# Patient Record
Sex: Male | Born: 1980 | Race: Black or African American | Hispanic: No | Marital: Married | Smoking: Current some day smoker
Health system: Southern US, Community
[De-identification: ages and names within clinical notes are randomized; demographics above are authoritative.]

## PROBLEM LIST (undated history)

## (undated) DIAGNOSIS — R7881 Bacteremia: Secondary | ICD-10-CM

## (undated) DIAGNOSIS — A4901 Methicillin susceptible Staphylococcus aureus infection, unspecified site: Secondary | ICD-10-CM

## (undated) DIAGNOSIS — I10 Essential (primary) hypertension: Secondary | ICD-10-CM

## (undated) DIAGNOSIS — B958 Unspecified staphylococcus as the cause of diseases classified elsewhere: Secondary | ICD-10-CM

## (undated) DIAGNOSIS — I330 Acute and subacute infective endocarditis: Secondary | ICD-10-CM

## (undated) DIAGNOSIS — I340 Nonrheumatic mitral (valve) insufficiency: Secondary | ICD-10-CM

## (undated) DIAGNOSIS — N179 Acute kidney failure, unspecified: Secondary | ICD-10-CM

## (undated) DIAGNOSIS — E11628 Type 2 diabetes mellitus with other skin complications: Secondary | ICD-10-CM

## (undated) DIAGNOSIS — A4101 Sepsis due to Methicillin susceptible Staphylococcus aureus: Secondary | ICD-10-CM

## (undated) DIAGNOSIS — E1165 Type 2 diabetes mellitus with hyperglycemia: Secondary | ICD-10-CM

## (undated) DIAGNOSIS — M60052 Infective myositis, left thigh: Secondary | ICD-10-CM

## (undated) DIAGNOSIS — A419 Sepsis, unspecified organism: Secondary | ICD-10-CM

## (undated) DIAGNOSIS — Z9119 Patient's noncompliance with other medical treatment and regimen: Secondary | ICD-10-CM

## (undated) DIAGNOSIS — R6521 Severe sepsis with septic shock: Secondary | ICD-10-CM

## (undated) DIAGNOSIS — Z720 Tobacco use: Secondary | ICD-10-CM

## (undated) DIAGNOSIS — L089 Local infection of the skin and subcutaneous tissue, unspecified: Secondary | ICD-10-CM

## (undated) DIAGNOSIS — Z9889 Other specified postprocedural states: Secondary | ICD-10-CM

## (undated) DIAGNOSIS — E119 Type 2 diabetes mellitus without complications: Secondary | ICD-10-CM

---
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
## 2002-02-08 ENCOUNTER — Emergency Department (HOSPITAL_COMMUNITY): Admission: EM | Admit: 2002-02-08 | Discharge: 2002-02-08 | Payer: Self-pay | Admitting: Emergency Medicine

## 2002-02-16 ENCOUNTER — Emergency Department (HOSPITAL_COMMUNITY): Admission: EM | Admit: 2002-02-16 | Discharge: 2002-02-16 | Payer: Self-pay | Admitting: Physical Therapy

## 2003-08-19 ENCOUNTER — Emergency Department (HOSPITAL_COMMUNITY): Admission: EM | Admit: 2003-08-19 | Discharge: 2003-08-20 | Payer: Self-pay | Admitting: Emergency Medicine

## 2003-08-28 ENCOUNTER — Emergency Department (HOSPITAL_COMMUNITY): Admission: EM | Admit: 2003-08-28 | Discharge: 2003-08-28 | Payer: Self-pay | Admitting: *Deleted

## 2003-08-29 ENCOUNTER — Emergency Department (HOSPITAL_COMMUNITY): Admission: EM | Admit: 2003-08-29 | Discharge: 2003-08-29 | Payer: Self-pay | Admitting: Emergency Medicine

## 2003-10-28 ENCOUNTER — Ambulatory Visit (HOSPITAL_BASED_OUTPATIENT_CLINIC_OR_DEPARTMENT_OTHER): Admission: RE | Admit: 2003-10-28 | Discharge: 2003-10-28 | Payer: Self-pay | Admitting: General Surgery

## 2003-10-28 ENCOUNTER — Ambulatory Visit (HOSPITAL_COMMUNITY): Admission: RE | Admit: 2003-10-28 | Discharge: 2003-10-28 | Payer: Self-pay | Admitting: General Surgery

## 2004-09-23 ENCOUNTER — Emergency Department (HOSPITAL_COMMUNITY): Admission: EM | Admit: 2004-09-23 | Discharge: 2004-09-23 | Payer: Self-pay

## 2004-09-29 ENCOUNTER — Emergency Department (HOSPITAL_COMMUNITY): Admission: EM | Admit: 2004-09-29 | Discharge: 2004-09-29 | Payer: Self-pay | Admitting: Emergency Medicine

## 2004-10-05 ENCOUNTER — Emergency Department (HOSPITAL_COMMUNITY): Admission: EM | Admit: 2004-10-05 | Discharge: 2004-10-05 | Payer: Self-pay | Admitting: Emergency Medicine

## 2007-03-26 ENCOUNTER — Emergency Department (HOSPITAL_COMMUNITY): Admission: EM | Admit: 2007-03-26 | Discharge: 2007-03-26 | Payer: Self-pay | Admitting: Emergency Medicine

## 2010-12-18 ENCOUNTER — Emergency Department (HOSPITAL_COMMUNITY)
Admission: EM | Admit: 2010-12-18 | Discharge: 2010-12-18 | Payer: Self-pay | Source: Home / Self Care | Admitting: Emergency Medicine

## 2012-02-22 ENCOUNTER — Encounter (HOSPITAL_COMMUNITY): Payer: Self-pay | Admitting: *Deleted

## 2012-02-22 ENCOUNTER — Emergency Department (HOSPITAL_COMMUNITY)
Admission: EM | Admit: 2012-02-22 | Discharge: 2012-02-22 | Disposition: A | Discharge status: Home / Self Care | Payer: Self-pay | Attending: Emergency Medicine | Admitting: Emergency Medicine

## 2012-02-22 DIAGNOSIS — L0211 Cutaneous abscess of neck: Secondary | ICD-10-CM | POA: Insufficient documentation

## 2012-02-22 DIAGNOSIS — L089 Local infection of the skin and subcutaneous tissue, unspecified: Secondary | ICD-10-CM | POA: Insufficient documentation

## 2012-02-22 DIAGNOSIS — L02811 Cutaneous abscess of head [any part, except face]: Secondary | ICD-10-CM

## 2012-02-22 DIAGNOSIS — F17200 Nicotine dependence, unspecified, uncomplicated: Secondary | ICD-10-CM | POA: Insufficient documentation

## 2012-02-22 DIAGNOSIS — E119 Type 2 diabetes mellitus without complications: Secondary | ICD-10-CM | POA: Insufficient documentation

## 2012-02-22 DIAGNOSIS — L02818 Cutaneous abscess of other sites: Secondary | ICD-10-CM | POA: Insufficient documentation

## 2012-02-22 MED ORDER — HYDROCODONE-ACETAMINOPHEN 5-325 MG PO TABS: 1.0000 | ORAL_TABLET | ORAL | Status: DC | PRN

## 2012-02-22 MED ORDER — SULFAMETHOXAZOLE-TRIMETHOPRIM 800-160 MG PO TABS: 1.0000 | ORAL_TABLET | Freq: Two times a day (BID) | ORAL | Status: AC

## 2012-02-22 MED ORDER — HYDROCODONE-ACETAMINOPHEN 5-325 MG PO TABS: 1.0000 | ORAL_TABLET | ORAL | Status: AC | PRN

## 2012-02-22 NOTE — Discharge Instructions (Signed)
Please have a recheck of your infection in the next 48 hours to be sure it is improving. You have been given prescriptions for antibiotics and pain medicine to use for your symptoms, please take these as instructed. He develop any worsening symptoms, increased pain, increased swelling, fever, chills please return to emergency room sooner.  Abscess An abscess (boil or furuncle) is an infected area that contains a collection of pus.  SYMPTOMS Signs and symptoms of an abscess include pain, tenderness, redness, or hardness. You may feel a moveable soft area under your skin. An abscess can occur anywhere in the body.  TREATMENT  A surgical cut (incision) may be made over your abscess to drain the pus. Gauze may be packed into the space or a drain may be looped through the abscess cavity (pocket). This provides a drain that will allow the cavity to heal from the inside outwards. The abscess may be painful for a few days, but should feel much better if it was drained.  Your abscess, if seen early, may not have localized and may not have been drained. If not, another appointment may be required if it does not get better on its own or with medications. HOME CARE INSTRUCTIONS   Only take over-the-counter or prescription medicines for pain, discomfort, or fever as directed by your caregiver.   Take your antibiotics as directed if they were prescribed. Finish them even if you start to feel better.   Keep the skin and clothes clean around your abscess.   If the abscess was drained, you will need to use gauze dressing to collect any draining pus. Dressings will typically need to be changed 3 or more times a day.   The infection may spread by skin contact with others. Avoid skin contact as much as possible.   Practice good hygiene. This includes regular hand washing, cover any draining skin lesions, and do not share personal care items.   If you participate in sports, do not share athletic equipment, towels,  whirlpools, or personal care items. Shower after every practice or tournament.   If a draining area cannot be adequately covered:   Do not participate in sports.   Children should not participate in day care until the wound has healed or drainage stops.   If your caregiver has given you a follow-up appointment, it is very important to keep that appointment. Not keeping the appointment could result in a much worse infection, chronic or permanent injury, pain, and disability. If there is any problem keeping the appointment, you must call back to this facility for assistance.  SEEK MEDICAL CARE IF:   You develop increased pain, swelling, redness, drainage, or bleeding in the wound site.   You develop signs of generalized infection including muscle aches, chills, fever, or a general ill feeling.   You have an oral temperature above 102 F (38.9 C).  MAKE SURE YOU:   Understand these instructions.   Will watch your condition.   Will get help right away if you are not doing well or get worse.  Document Released: 09/26/2005 Document Revised: 08/29/2011 Document Reviewed: 07/20/2008 [facility] Patient Information 2012 [place], [place].

## 2012-02-22 NOTE — ED Provider Notes (Signed)
History     CSN: [id number]  Arrival date & time 02/22/12  0029   First MD Initiated Contact with Patient 02/22/12 0047      Chief Complaint  Patient presents with  . Recurrent Skin Infections     HPI  History provided by the patient. Patient is a 32 year old African American male with history of diabetes who presents complaints of pain and swelling to the back of his neck and head. Patient reports having small little bumps around the hair on his head and neck, go the past week or more. Over the past 2 days he began having increasing swelling and soreness that area. Patient did try squeezing the area with small amounts of pus and blood. Patient denies any fever, chills, sweats. Patient has not done anything of her symptoms. He denies any other aggravating or alleviating factors. Symptoms are described as moderate to severe.   Past Medical History  Diagnosis Date  . Diabetes mellitus     History reviewed. No pertinent past surgical history.  No family history on file.  History  Substance Use Topics  . Smoking status: Current Everyday Smoker    Types: Cigars  . Smokeless tobacco: Not on file  . Alcohol Use: Yes      Review of Systems  All other systems reviewed and are negative.    Allergies  Review of patient's allergies indicates no known allergies.  Home Medications  No current outpatient prescriptions on file.  BP 163/88  Pulse 99  Temp(Src) 98.6 F (37 C) (Oral)  Resp 20  SpO2 98%  Physical Exam  Nursing note and vitals reviewed. Constitutional: He is oriented to person, place, and time. He appears well-developed and well-nourished. No distress.  HENT:  Head: Normocephalic.  Cardiovascular: Normal rate and regular rhythm.   Pulmonary/Chest: Effort normal and breath sounds normal.  Neurological: He is alert and oriented to person, place, and time.  Skin: Skin is warm.       4 cm area of erythema and induration the posterior right neck and scalp  within the hair with a central area of scabbing. No active bleeding or drainage. Area is tender to palpation.  Psychiatric: He has a normal mood and affect. His behavior is normal.    ED Course  Procedures    INCISION AND DRAINAGE Performed by: Angus Seller Consent: Verbal consent obtained. Risks and benefits: risks, benefits and alternatives were discussed Type: abscess  Body area: Right posterior scalp and neck  Anesthesia: local infiltration  Local anesthetic: lidocaine 2 % without epinephrine  Anesthetic total: 3 ml  Complexity: complex Blunt dissection to break up loculations  Drainage: purulent  Drainage amount: Small   Packing material: None   Patient tolerance: Patient tolerated the procedure well with no immediate complications.     1. Abscess of scalp       MDM  1:00 AM patient seen and evaluated. Patient in no acute distress.        Angus Seller, [place] 02/22/12 [phone]

## 2012-02-22 NOTE — ED Notes (Signed)
Pt has an boil on the back of his head.  Swelling to the surrounding area and appears to have been draining.  No fever or chills with this.

## 2012-02-22 NOTE — ED Notes (Signed)
Rx x 2, pt voiced understanding to f/u in 48 hours to check for sx improvement.

## 2012-02-26 NOTE — ED Provider Notes (Signed)
Evaluation and management procedures were performed by the PA/NP under my supervision/collaboration.   Felisa Bonier, MD 02/26/12 2024

## 2012-04-25 ENCOUNTER — Emergency Department (HOSPITAL_COMMUNITY): Payer: Managed Care, Other (non HMO)

## 2012-04-25 ENCOUNTER — Emergency Department (HOSPITAL_COMMUNITY)
Admission: EM | Admit: 2012-04-25 | Discharge: 2012-04-25 | Disposition: A | Discharge status: Home / Self Care | Payer: Managed Care, Other (non HMO) | Attending: Emergency Medicine | Admitting: Emergency Medicine

## 2012-04-25 ENCOUNTER — Encounter (HOSPITAL_COMMUNITY): Payer: Self-pay | Admitting: *Deleted

## 2012-04-25 DIAGNOSIS — H5710 Ocular pain, unspecified eye: Secondary | ICD-10-CM | POA: Insufficient documentation

## 2012-04-25 DIAGNOSIS — E119 Type 2 diabetes mellitus without complications: Secondary | ICD-10-CM | POA: Insufficient documentation

## 2012-04-25 DIAGNOSIS — H209 Unspecified iridocyclitis: Secondary | ICD-10-CM | POA: Insufficient documentation

## 2012-04-25 MED ORDER — CYCLOPENTOLATE HCL 1 % OP SOLN: 1.0000 [drp] | Freq: Three times a day (TID) | OPHTHALMIC | Status: DC

## 2012-04-25 MED ORDER — HYDROCODONE-ACETAMINOPHEN 5-325 MG PO TABS: 2.0000 | ORAL_TABLET | Freq: Four times a day (QID) | ORAL | Status: AC | PRN

## 2012-04-25 MED ORDER — IBUPROFEN 800 MG PO TABS: 800.0000 mg | ORAL_TABLET | Freq: Three times a day (TID) | ORAL | Status: AC | PRN

## 2012-04-25 MED ORDER — FLUORESCEIN SODIUM 1 MG OP STRP
ORAL_STRIP | OPHTHALMIC | Status: AC
  Administered 2012-04-25: 19:00:00
  Filled 2012-04-25: qty 1

## 2012-04-25 MED ORDER — TETRACAINE HCL 0.5 % OP SOLN
OPHTHALMIC | Status: AC
  Administered 2012-04-25: 2 [drp] via OPHTHALMIC
  Filled 2012-04-25: qty 2

## 2012-04-25 MED ORDER — TETRACAINE HCL 0.5 % OP SOLN
2.0000 [drp] | Freq: Once | OPHTHALMIC | Status: AC
  Administered 2012-04-25: 2 [drp] via OPHTHALMIC

## 2012-04-25 MED ORDER — HYDROCODONE-ACETAMINOPHEN 5-325 MG PO TABS
2.0000 | ORAL_TABLET | Freq: Once | ORAL | Status: AC
  Administered 2012-04-25: 2 via ORAL
  Filled 2012-04-25: qty 2

## 2012-04-25 MED ORDER — DOCUSATE SODIUM 100 MG PO CAPS: 100.0000 mg | ORAL_CAPSULE | Freq: Two times a day (BID) | ORAL | Status: AC
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
## 2012-04-25 NOTE — Discharge Instructions (Signed)
Iritis Iritis is when the colored part of the eye (iris) is red and painful (inflamed). Light might seem to hurt your eyes (light sensitivity). You may have blurred vision or start to tear up. It is important to treat this problem early.  HOME CARE  Use eyedrops or pills (corticosteroid medicine) as told by your doctor.   Only take medicine as told by your doctor.  GET HELP RIGHT AWAY IF:   You have redness in one or both eyes.   Light seems to hurt your eyes.   You have pain in one or both eyes.  MAKE SURE YOU:   Understand these instructions.   Will watch your condition.   Will get help right away if you are not doing well or get worse.  Document Released: 03/13/2010 Document Revised: 12/06/2011 Document Reviewed: 03/13/2010 [facility] Patient Information 2012 [place], [place].Eye Contusion A black eye can happen when the eye area is hit with force. Bleeding can happen under the skin of the eyelids. This causes the lids to puff up (swell) and look red and then black. This is often called a "black eye." If the injury involves only the lids and the surface of the eye, the black area will turn greenish and then yellowish in color before going away. The swelling may also go under the clear membrane that covers the white part of the eyeball making it very thick and red. If the black eye only involves the lids and tissues around the eye, the injured area will get better within a few days to weeks. However, black eyes can be serious and affect the eyeball and sight. CAUSES   Blunt injury or trauma to the face or eye area.   A forehead injury when blood under the skin works its way down to the eyelids.   Rubbing the eyes due to irritation (like allergies).   Blood clotting diseases.   Other serious eye or eye socket (orbit) diseases (such as a tumor, blood clot, or infection behind the eye).  SYMPTOMS   Swelling and redness of the eyelids.   Eyelid area turns to a black color.    Tenderness or soreness of the eyelids.  DIAGNOSIS  A diagnosis is usually based upon a thorough examination of the eye and surrounding area. The eye must be looked at carefully to be sure that it is not injured or that nothing else can threaten vision. An X-ray or CT scan may be needed to determine if there were any associated injuries, such as fractures. HOME CARE INSTRUCTIONS   If it is determined that there is no injury to the eye, you may continue normal activities.   Sunglasses may be worn to protect your eyes from bright light if this is uncomfortable.   Apply ice to the injury for 15 to 20 minutes, 3 to 4 times per day for swelling during the first 24 to 48 hours. To do this:   Put the ice in a plastic bag.   Place a towel between the bag of ice and your skin.   Sleep with your head high (elevated). You can put an extra pillow under your head. This may help with discomfort.   Only take over-the-counter or prescription medicines for pain, discomfort, or fever as directed by your caregiver.   Do not use aspirin as this can increase bleeding.  If there is an injury to the eye, treatment will be determined by the nature of the injury. SEEK IMMEDIATE MEDICAL CARE IF:  You have any form of visual loss in the eye on the side of the injury.   You have double vision. You see 2 of everything (diplopia).   You feel sick to your stomach, dizzy, sleepy, or faint.   You develop any discharge from the eye or your nose.   The swelling and discoloration does not begin to go away in a few days.  MAKE SURE YOU:   Understand these instructions.   Will watch your condition.   Will get help right away if you are not doing well or get worse.  Document Released: 12/14/2000 Document Revised: 12/06/2011 Document Reviewed: 11/02/2011 [facility] Patient Information 2012 [place], [place].
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
## 2012-04-26 NOTE — ED Provider Notes (Signed)
I saw and evaluated the patient, reviewed the resident's note and I agree with the findings and plan.  Pt with a left traumatic iritis.  DC home with op for followup.  The patient was given a cycloplegic for pain control.  Lyanne Co, MD 04/26/12 [phone]

## 2013-05-18 ENCOUNTER — Emergency Department (HOSPITAL_COMMUNITY): Payer: Self-pay

## 2013-05-18 ENCOUNTER — Encounter (HOSPITAL_COMMUNITY): Payer: Self-pay | Admitting: Emergency Medicine

## 2013-05-18 ENCOUNTER — Emergency Department (HOSPITAL_COMMUNITY)
Admission: EM | Admit: 2013-05-18 | Discharge: 2013-05-18 | Disposition: A | Discharge status: Home Health | Payer: Self-pay | Attending: Emergency Medicine | Admitting: Emergency Medicine

## 2013-05-18 DIAGNOSIS — R197 Diarrhea, unspecified: Secondary | ICD-10-CM | POA: Insufficient documentation

## 2013-05-18 DIAGNOSIS — R141 Gas pain: Secondary | ICD-10-CM | POA: Insufficient documentation

## 2013-05-18 DIAGNOSIS — R000 Tachycardia, unspecified: Secondary | ICD-10-CM | POA: Insufficient documentation

## 2013-05-18 DIAGNOSIS — R143 Flatulence: Secondary | ICD-10-CM | POA: Insufficient documentation

## 2013-05-18 DIAGNOSIS — E1169 Type 2 diabetes mellitus with other specified complication: Secondary | ICD-10-CM | POA: Insufficient documentation

## 2013-05-18 DIAGNOSIS — R739 Hyperglycemia, unspecified: Secondary | ICD-10-CM

## 2013-05-18 DIAGNOSIS — F17200 Nicotine dependence, unspecified, uncomplicated: Secondary | ICD-10-CM | POA: Insufficient documentation

## 2013-05-18 DIAGNOSIS — R142 Eructation: Secondary | ICD-10-CM | POA: Insufficient documentation

## 2013-05-18 LAB — URINALYSIS, ROUTINE W REFLEX MICROSCOPIC
Bilirubin Urine: NEGATIVE
Glucose, UA: >1000 mg/dL — AB
Ketones, ur: NEGATIVE mg/dL
Protein, ur: >300 mg/dL — AB
Specific Gravity, Urine: 1.042 — ABNORMAL HIGH (ref 1.005–1.030)
pH: 5 (ref 5.0–8.0)

## 2013-05-18 LAB — URINE MICROSCOPIC-ADD ON

## 2013-05-18 LAB — CBC WITH DIFFERENTIAL/PLATELET
Basophils Absolute: 0 10*3/uL (ref 0.0–0.1)
Basophils Relative: 0 % (ref 0–1)
Eosinophils Relative: 2 % (ref 0–5)
HCT: 46.8 % (ref 39.0–52.0)
Hemoglobin: 16.8 g/dL (ref 13.0–17.0)
Neutrophils Relative %: 73 % (ref 43–77)
WBC: 6.9 10*3/uL (ref 4.0–10.5)

## 2013-05-18 LAB — COMPREHENSIVE METABOLIC PANEL
AST: 14 U/L (ref 0–37)
Albumin: 3.9 g/dL (ref 3.5–5.2)
Creatinine, Ser: 1.22 mg/dL (ref 0.50–1.35)
GFR calc non Af Amer: 77 mL/min — ABNORMAL LOW (ref >90)
Glucose, Bld: 437 mg/dL — ABNORMAL HIGH (ref 70–99)
Sodium: 134 mEq/L — ABNORMAL LOW (ref 135–145)
Total Bilirubin: 0.2 mg/dL — ABNORMAL LOW (ref 0.3–1.2)

## 2013-05-18 LAB — GLUCOSE, CAPILLARY
Glucose-Capillary: 409 mg/dL — ABNORMAL HIGH (ref 70–99)
Glucose-Capillary: 368 mg/dL — ABNORMAL HIGH (ref 70–99)

## 2013-05-18 MED ORDER — IOHEXOL 300 MG/ML  SOLN
25.0000 mL | INTRAMUSCULAR | Status: AC
  Administered 2013-05-18 (×2): 25 mL via ORAL

## 2013-05-18 MED ORDER — SODIUM CHLORIDE 0.9 % IV SOLN
INTRAVENOUS | Status: DC
  Administered 2013-05-18: 19:00:00 via INTRAVENOUS

## 2013-05-18 MED ORDER — LORAZEPAM 2 MG/ML IJ SOLN
1.0000 mg | Freq: Once | INTRAMUSCULAR | Status: AC
  Administered 2013-05-18: 1 mg via INTRAVENOUS
  Filled 2013-05-18: qty 1

## 2013-05-18 MED ORDER — METFORMIN HCL 500 MG PO TABS: 500.0000 mg | ORAL_TABLET | Freq: Two times a day (BID) | ORAL | Status: DC

## 2013-05-18 MED ORDER — SODIUM CHLORIDE 0.9 % IV BOLUS (SEPSIS)
1000.0000 mL | Freq: Once | INTRAVENOUS | Status: DC
Start: 2013-05-18 — End: 2013-05-18

## 2013-05-18 MED ORDER — SODIUM CHLORIDE 0.9 % IV BOLUS (SEPSIS)
1000.0000 mL | Freq: Once | INTRAVENOUS | Status: AC
  Administered 2013-05-18: 1000 mL via INTRAVENOUS

## 2013-05-18 MED ORDER — SODIUM CHLORIDE 0.9 % IV SOLN: INTRAVENOUS | Status: DC

## 2013-05-18 MED ORDER — INSULIN ASPART 100 UNIT/ML ~~LOC~~ SOLN
8.0000 [IU] | Freq: Once | SUBCUTANEOUS | Status: AC
  Administered 2013-05-18: 8 [IU] via SUBCUTANEOUS
  Filled 2013-05-18: qty 1

## 2013-05-18 MED ORDER — IOHEXOL 300 MG/ML  SOLN
100.0000 mL | Freq: Once | INTRAMUSCULAR | Status: AC | PRN
  Administered 2013-05-18: 100 mL via INTRAVENOUS

## 2013-05-18 MED ORDER — ONDANSETRON HCL 4 MG/2ML IJ SOLN
4.0000 mg | Freq: Once | INTRAMUSCULAR | Status: AC
  Administered 2013-05-18: 4 mg via INTRAVENOUS
  Filled 2013-05-18: qty 2

## 2013-05-18 NOTE — ED Provider Notes (Signed)
History     CSN: [id number]  Arrival date & time 05/18/13  1322   First MD Initiated Contact with Patient 05/18/13 1538      Chief Complaint  Patient presents with  . Nausea  . Diarrhea    (Consider location/radiation/quality/duration/timing/severity/associated sxs/prior treatment) Patient is a 33 y.o. male presenting with diarrhea. The history is provided by the patient.  Diarrhea  patient here with watery diarrhea and vomiting that began yesterday after he has been getting. Denies any abdominal pain. No fever reported. His emesis has been nonbilious. No blood noted in stool. Symptoms have been persistent and nothing makes it better or worse. Denies any prior history of gallbladder disease. Has had similar symptoms x3 in the past without diagnosis. Denies any urinary symptoms. Does have a history of diabetes  Past Medical History  Diagnosis Date  . Diabetes mellitus     History reviewed. No pertinent past surgical history.  History reviewed. No pertinent family history.  History  Substance Use Topics  . Smoking status: Current Every Day Smoker    Types: Cigars  . Smokeless tobacco: Not on file  . Alcohol Use: Yes      Review of Systems  Gastrointestinal: Positive for diarrhea.  All other systems reviewed and are negative.    Allergies  Review of patient's allergies indicates no known allergies.  Home Medications  No current outpatient prescriptions on file.  BP 134/86  Pulse 106  Temp(Src) 98.1 F (36.7 C) (Oral)  Resp 16  SpO2 100%  Physical Exam  Nursing note and vitals reviewed. Constitutional: He is oriented to person, place, and time. He appears well-developed and well-nourished.  Non-toxic appearance. No distress.  HENT:  Head: Normocephalic and atraumatic.  Eyes: Conjunctivae, EOM and lids are normal. Pupils are equal, round, and reactive to light.  Neck: Normal range of motion. Neck supple. No tracheal deviation present. No mass present.   Cardiovascular: Regular rhythm and normal heart sounds.  Tachycardia present.  Exam reveals no gallop.   No murmur heard. Pulmonary/Chest: Effort normal and breath sounds normal. No stridor. No respiratory distress. He has no decreased breath sounds. He has no wheezes. He has no rhonchi. He has no rales.  Abdominal: Soft. Normal appearance and bowel sounds are normal. He exhibits no distension. There is no tenderness. There is no rebound and no CVA tenderness.  Musculoskeletal: Normal range of motion. He exhibits no edema and no tenderness.  Neurological: He is alert and oriented to person, place, and time. He has normal strength. No cranial nerve deficit or sensory deficit. GCS eye subscore is 4. GCS verbal subscore is 5. GCS motor subscore is 6.  Skin: Skin is warm and dry. No abrasion and no rash noted.  Psychiatric: He has a normal mood and affect. His speech is normal and behavior is normal.    ED Course  Procedures (including critical care time)  Labs Reviewed  COMPREHENSIVE METABOLIC PANEL - Abnormal; Notable for the following:    Sodium 134 (*)    Glucose, Bld 437 (*)    Total Bilirubin 0.2 (*)    GFR calc non Af Amer 77 (*)    GFR calc Af Amer 89 (*)    All other components within normal limits  GLUCOSE, CAPILLARY - Abnormal; Notable for the following:    Glucose-Capillary 409 (*)    All other components within normal limits  CBC WITH DIFFERENTIAL  LIPASE, BLOOD  URINALYSIS, ROUTINE W REFLEX MICROSCOPIC   No results found.  No diagnosis found.    MDM   Patient given IV fluids and insulin for his hyperglycemia. Also give anti-medics for his vomiting. Abdominal CT was performed and was negative for acute process. Patient be started on Glucophage for his hyperglycemia and was given referrals       Toy Baker, MD 05/18/13 2019

## 2013-05-18 NOTE — ED Notes (Signed)
CBG 184  

## 2013-05-18 NOTE — ED Notes (Signed)
Pt c/o diarrhea and nausea and bad taste in mouth after eating spaghetti at home. Pt states this is the 2nd time this year he has had these sx. Pt rates abd pain 7/10 and states he took nothing at home prior to coming to ED.

## 2013-05-18 NOTE — ED Notes (Signed)
CT notified that pt has completed contrast °

## 2013-05-18 NOTE — ED Notes (Signed)
Pt c/o diarrhea, nausea and foul odor with belching

## 2013-05-18 NOTE — ED Notes (Signed)
Dr Allen at bedside  

## 2013-10-26 ENCOUNTER — Emergency Department (HOSPITAL_COMMUNITY)
Admission: EM | Admit: 2013-10-26 | Discharge: 2013-10-26 | Disposition: A | Discharge status: Home / Self Care | Payer: Managed Care, Other (non HMO) | Attending: Emergency Medicine | Admitting: Emergency Medicine

## 2013-10-26 DIAGNOSIS — E11621 Type 2 diabetes mellitus with foot ulcer: Secondary | ICD-10-CM

## 2013-10-26 DIAGNOSIS — Z79899 Other long term (current) drug therapy: Secondary | ICD-10-CM | POA: Insufficient documentation

## 2013-10-26 DIAGNOSIS — L97509 Non-pressure chronic ulcer of other part of unspecified foot with unspecified severity: Secondary | ICD-10-CM | POA: Insufficient documentation

## 2013-10-26 DIAGNOSIS — I10 Essential (primary) hypertension: Secondary | ICD-10-CM | POA: Insufficient documentation

## 2013-10-26 DIAGNOSIS — F17200 Nicotine dependence, unspecified, uncomplicated: Secondary | ICD-10-CM | POA: Insufficient documentation

## 2013-10-26 DIAGNOSIS — E1169 Type 2 diabetes mellitus with other specified complication: Secondary | ICD-10-CM | POA: Insufficient documentation

## 2013-10-26 DIAGNOSIS — K029 Dental caries, unspecified: Secondary | ICD-10-CM

## 2013-10-26 DIAGNOSIS — R739 Hyperglycemia, unspecified: Secondary | ICD-10-CM

## 2013-10-26 LAB — BASIC METABOLIC PANEL
BUN: 13 mg/dL (ref 6–23)
CO2: 26 mEq/L (ref 19–32)
Calcium: 9.9 mg/dL (ref 8.4–10.5)
Chloride: 97 mEq/L (ref 96–112)
Creatinine, Ser: 1.37 mg/dL — ABNORMAL HIGH (ref 0.50–1.35)
GFR calc Af Amer: 77 mL/min — ABNORMAL LOW (ref >90)
GFR calc non Af Amer: 67 mL/min — ABNORMAL LOW (ref >90)
Glucose, Bld: 396 mg/dL — ABNORMAL HIGH (ref 70–99)
Potassium: 4.3 mEq/L (ref 3.5–5.1)
Sodium: 134 mEq/L — ABNORMAL LOW (ref 135–145)

## 2013-10-26 LAB — CBC WITH DIFFERENTIAL/PLATELET
Basophils Absolute: 0 10*3/uL (ref 0.0–0.1)
Basophils Relative: 0 % (ref 0–1)
Eosinophils Absolute: 0.1 10*3/uL (ref 0.0–0.7)
Eosinophils Relative: 1 % (ref 0–5)
HCT: 39.5 % (ref 39.0–52.0)
Hemoglobin: 14.3 g/dL (ref 13.0–17.0)
Lymphocytes Relative: 22 % (ref 12–46)
Lymphs Abs: 1.3 10*3/uL (ref 0.7–4.0)
MCH: 29.7 pg (ref 26.0–34.0)
MCHC: 36.2 g/dL — ABNORMAL HIGH (ref 30.0–36.0)
MCV: 82 fL (ref 78.0–100.0)
Monocytes Absolute: 0.3 10*3/uL (ref 0.1–1.0)
Monocytes Relative: 5 % (ref 3–12)
Neutro Abs: 4.3 10*3/uL (ref 1.7–7.7)
Neutrophils Relative %: 72 % (ref 43–77)
Platelets: 174 10*3/uL (ref 150–400)
RBC: 4.82 MIL/uL (ref 4.22–5.81)
RDW: 12 % (ref 11.5–15.5)
WBC: 6 10*3/uL (ref 4.0–10.5)

## 2013-10-26 LAB — URINALYSIS, ROUTINE W REFLEX MICROSCOPIC
Bilirubin Urine: NEGATIVE
Glucose, UA: >1000 mg/dL — AB
Ketones, ur: NEGATIVE mg/dL
Leukocytes, UA: NEGATIVE
Nitrite: NEGATIVE
Protein, ur: >300 mg/dL — AB
Specific Gravity, Urine: 1.044 — ABNORMAL HIGH (ref 1.005–1.030)
Urobilinogen, UA: 0.2 mg/dL (ref 0.0–1.0)
pH: 5 (ref 5.0–8.0)

## 2013-10-26 LAB — URINE MICROSCOPIC-ADD ON

## 2013-10-26 LAB — GLUCOSE, CAPILLARY: Glucose-Capillary: 360 mg/dL — ABNORMAL HIGH (ref 70–99)

## 2013-10-26 MED ORDER — PROPOFOL 10 MG/ML IV BOLUS: 0.5000 mg/kg | Freq: Once | INTRAVENOUS | Status: DC

## 2013-10-26 NOTE — ED Notes (Signed)
[place] [employer] [place]  Pt brought in to be checked out for diabetes ulcer to bilateral feet.  Pt reports n/v, chills and fever.  Pt reports hx diab and ?HTN.  Pt reports being given prescription for antibotics to treat infection to foot wound but was unable to get due to finances.

## 2013-11-01 NOTE — ED Provider Notes (Signed)
CSN: [id number]     Arrival date & time 10/26/13  1252 History   First MD Initiated Contact with Patient 10/26/13 1318     Chief Complaint  Patient presents with  . Blood Sugar Problem   (Consider location/radiation/quality/duration/timing/severity/associated sxs/prior Treatment) HPI  33 year old male brought in by police for evaluation of wounds prior to being incarcerated. Patient reports his wounds are chronic and has had both a home for at least several months. History diabetes and hypertension. He was prescribed medications but did not fill them. Does not have routine medical care. Previous evaluation of these wounds to prescribe antibiotics, but he did not fill this prescription either. Denies any significant pain in his feet. No drainage. No fevers or chills. Some mild nausea, but no vomiting.  Past Medical History  Diagnosis Date  . Diabetes mellitus    No past surgical history on file. No family history on file. History  Substance Use Topics  . Smoking status: Current Every Day Smoker    Types: Cigars  . Smokeless tobacco: Not on file  . Alcohol Use: Yes    Review of Systems  All systems reviewed and negative, other than as noted in HPI.   Allergies  Review of patient's allergies indicates no known allergies.  Home Medications   Current Outpatient Rx  Name  Route  Sig  Dispense  Refill  . metFORMIN (GLUCOPHAGE) 500 MG tablet   Oral   Take 1 tablet (500 mg total) by mouth 2 (two) times daily with a meal.   60 tablet   0    BP 162/97  Pulse 93  Temp(Src) 98.4 F (36.9 C) (Oral)  Resp 16  SpO2 100% Physical Exam  Nursing note and vitals reviewed. Constitutional: He appears well-developed and well-nourished. No distress.  HENT:  Head: Normocephalic and atraumatic.  Very poor dentition and diffuse dental decay. No evidence of drainable collection. Posterior pharynx is clear. Uvula is midline. No submental or induration. No tongue elevation. Handling  secretions. Normal sounding phonation.  Eyes: Conjunctivae are normal. Right eye exhibits no discharge. Left eye exhibits no discharge.  Neck: Neck supple.  Cardiovascular: Normal rate, regular rhythm and normal heart sounds.  Exam reveals no gallop and no friction rub.   No murmur heard. Pulmonary/Chest: Effort normal and breath sounds normal. No respiratory distress.  Abdominal: Soft. He exhibits no distension. There is no tenderness.  Musculoskeletal: He exhibits no edema and no tenderness.  Neurological: He is alert.  Skin: Skin is warm and dry.  Roughly quarter-sized wounds in the same area to bilateral feet. Wounds are to the plantar aspect over the area of the heads of the second and third metatarsals. Wound on the left foot appears to be healing pretty well with thick callus formation. The wound on the right appears to be healing without complication. The periphery of this wound this will callus, but there is a small area of friable tissue at the center. It is healthy-appearing. No drainage. Wound does not probe deeply. No foul smell.  Psychiatric: He has a normal mood and affect. His behavior is normal. Thought content normal.    ED Course  Procedures (including critical care time) Labs Review Labs Reviewed  GLUCOSE, CAPILLARY - Abnormal; Notable for the following:    Glucose-Capillary 360 (*)    All other components within normal limits  BASIC METABOLIC PANEL - Abnormal; Notable for the following:    Sodium 134 (*)    Glucose, Bld 396 (*)  Creatinine, Ser 1.37 (*)    GFR calc non Af Amer 67 (*)    GFR calc Af Amer 77 (*)    All other components within normal limits  CBC WITH DIFFERENTIAL - Abnormal; Notable for the following:    MCHC 36.2 (*)    All other components within normal limits  URINALYSIS, ROUTINE W REFLEX MICROSCOPIC - Abnormal; Notable for the following:    Specific Gravity, Urine 1.044 (*)    Glucose, UA >1000 (*)    Hgb urine dipstick MODERATE (*)     Protein, ur >300 (*)    All other components within normal limits  URINE MICROSCOPIC-ADD ON - Abnormal; Notable for the following:    Casts HYALINE CASTS (*)    All other components within normal limits   Imaging Review No results found.  EKG Interpretation   None       MDM   1. 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 Hyperglycemia   3. Dental decay    33 year old male with poorly controlled diabetes. No ketonuria. No metabolic acidosis. Wounds to bilateral feet likely related to his history poorly controlled diabetes. Chronic appearing, but do not appear to be infected. Discuss with patient the need for diligent wound care. Discussed the need to be compliant with his medications. Discussed the need to establish primary care. Resource list was provided.   Raeford Razor, MD 11/01/13 [phone]

## 2013-12-01 ENCOUNTER — Emergency Department (HOSPITAL_COMMUNITY): Payer: Self-pay

## 2013-12-01 ENCOUNTER — Emergency Department (HOSPITAL_COMMUNITY)
Admission: EM | Admit: 2013-12-01 | Discharge: 2013-12-01 | Disposition: A | Discharge status: Home / Self Care | Payer: Self-pay | Attending: Emergency Medicine | Admitting: Emergency Medicine

## 2013-12-01 ENCOUNTER — Encounter (HOSPITAL_COMMUNITY): Payer: Self-pay | Admitting: Emergency Medicine

## 2013-12-01 DIAGNOSIS — E1169 Type 2 diabetes mellitus with other specified complication: Secondary | ICD-10-CM | POA: Insufficient documentation

## 2013-12-01 DIAGNOSIS — L97509 Non-pressure chronic ulcer of other part of unspecified foot with unspecified severity: Secondary | ICD-10-CM | POA: Insufficient documentation

## 2013-12-01 DIAGNOSIS — Z79899 Other long term (current) drug therapy: Secondary | ICD-10-CM | POA: Insufficient documentation

## 2013-12-01 DIAGNOSIS — E11621 Type 2 diabetes mellitus with foot ulcer: Secondary | ICD-10-CM

## 2013-12-01 DIAGNOSIS — E119 Type 2 diabetes mellitus without complications: Secondary | ICD-10-CM

## 2013-12-01 DIAGNOSIS — I10 Essential (primary) hypertension: Secondary | ICD-10-CM | POA: Insufficient documentation

## 2013-12-01 HISTORY — DX: Essential (primary) hypertension: I10

## 2013-12-01 LAB — CBC WITH DIFFERENTIAL/PLATELET
Basophils Absolute: 0 10*3/uL (ref 0.0–0.1)
Basophils Relative: 0 % (ref 0–1)
Hemoglobin: 14.1 g/dL (ref 13.0–17.0)
MCHC: 36.1 g/dL — ABNORMAL HIGH (ref 30.0–36.0)
Neutro Abs: 4.2 10*3/uL (ref 1.7–7.7)
Neutrophils Relative %: 66 % (ref 43–77)
RDW: 12.3 % (ref 11.5–15.5)
WBC: 6.3 10*3/uL (ref 4.0–10.5)

## 2013-12-01 LAB — COMPREHENSIVE METABOLIC PANEL
ALT: 13 U/L (ref 0–53)
AST: 9 U/L (ref 0–37)
Albumin: 3.3 g/dL — ABNORMAL LOW (ref 3.5–5.2)
Alkaline Phosphatase: 86 U/L (ref 39–117)
Chloride: 98 mEq/L (ref 96–112)
Potassium: 3.6 mEq/L (ref 3.5–5.1)
Total Bilirubin: 0.2 mg/dL — ABNORMAL LOW (ref 0.3–1.2)

## 2013-12-01 LAB — GLUCOSE, CAPILLARY: Glucose-Capillary: 333 mg/dL — ABNORMAL HIGH (ref 70–99)

## 2013-12-01 MED ORDER — SODIUM CHLORIDE 0.9 % IV SOLN
Freq: Once | INTRAVENOUS | Status: AC
  Administered 2013-12-01: 09:00:00 via INTRAVENOUS

## 2013-12-01 MED ORDER — INSULIN ASPART 100 UNIT/ML ~~LOC~~ SOLN
10.0000 [IU] | Freq: Once | SUBCUTANEOUS | Status: AC
  Administered 2013-12-01: 10 [IU] via SUBCUTANEOUS
  Filled 2013-12-01: qty 1

## 2013-12-01 MED ORDER — CIPROFLOXACIN HCL 500 MG PO TABS: 500.0000 mg | ORAL_TABLET | Freq: Two times a day (BID) | ORAL | Status: DC

## 2013-12-01 MED ORDER — SODIUM CHLORIDE 0.9 % IV BOLUS (SEPSIS)
1000.0000 mL | Freq: Once | INTRAVENOUS | Status: AC
  Administered 2013-12-01: 1000 mL via INTRAVENOUS

## 2013-12-01 MED ORDER — METFORMIN HCL 500 MG PO TABS: 500.0000 mg | ORAL_TABLET | Freq: Two times a day (BID) | ORAL | Status: DC

## 2013-12-01 MED ORDER — HYDROCODONE-ACETAMINOPHEN 5-325 MG PO TABS: 1.0000 | ORAL_TABLET | ORAL | Status: DC | PRN

## 2013-12-01 NOTE — Progress Notes (Signed)
Case Manager Consult for Medication / PCP assistance.ED Case Manager met patient and wife at bedside.Role of ED Case Manager explained.Patient reports foot pain  As reason for today's ED visit.Patient reports he has no Programmer, applications or a PCP.Education provided on the Importance of establishing a PCP.Resource sheet provided  For the [facility].This Clinical research associate relayed to patient with his consent she can e-mail the [facility] and assist  with PCP appointment set up. Patient  Was   Receptive to this.

## 2013-12-01 NOTE — Progress Notes (Signed)
Case Manager liaised with Dr Tiburcio Pea re patients plan of care. Patient will be discharged with prescriptions for Metformin for Diabetes Mellitus and Ciprofloxacin anti-biotics. Patient / Wife educated by this Case Manager  Regarding the Generic prescription savings club at [employer] that offers free generic diabetic medications  up to a 30   Day supply.Patient provided with a resource sheet on the savings club.Patient educated his antibiotics will be on the [employer].Patient aware certain antibiotics are  $3.99 or free.Patient provided with resource sheet for the [place] Diabetic uninsured program which offers a free blood glucose meter and education resources. Education  Hotel manager provided for Managing Diabetes-[employer] and the [employer].Teach back method used to ensure patient understanding. Patient  Confirms his understanding of verbal education / written resources and states he will call and make his PCP follow up appointment for the [facility].

## 2013-12-01 NOTE — ED Provider Notes (Signed)
CSN: [id number]     Arrival date & time 12/01/13  0815 History   First MD Initiated Contact with Patient 12/01/13 [phone]     Chief Complaint  Patient presents with  . Wound Infection    Feet   (Consider location/radiation/quality/duration/timing/severity/associated sxs/prior Treatment) HPI  [name]. presents emergency Department with chief complaint of bilateral foot pain and ulceration.  This is a gentleman with a history of poorly controlled diabetes.  He has been unable to obtain medication for the past 6 months.  Patient was recently incarcerated states he was on insulin at that time.  He has been unable to obtain medication since then.  She complains of bilateral foot pain.  He has ulcerations.  Placing covers on the window and feels there may be purulent discharge.  Patient denies any fevers, chills, nausea, vomiting , chest pain, shortness of breath, diarrhea.  He states that his ulcers are painful.  Past Medical History  Diagnosis Date  . Diabetes mellitus   . Hypertension    History reviewed. No pertinent past surgical history. History reviewed. No pertinent family history. History  Substance Use Topics  . Smoking status: Current Every Day Smoker    Types: Cigars  . Smokeless tobacco: Not on file  . Alcohol Use: Yes    Review of Systems  Constitutional: Negative for fever and chills.  Respiratory: Negative for cough and shortness of breath.   Cardiovascular: Negative for chest pain and palpitations.  Gastrointestinal: Negative for vomiting, abdominal pain, diarrhea and constipation.  Endocrine: Positive for polydipsia and polyuria.  Genitourinary: Negative for dysuria, urgency and frequency.  Musculoskeletal: Negative for arthralgias and myalgias.  Skin: Positive for wound. Negative for rash.  Neurological: Negative for headaches.    Allergies  Review of patient's allergies indicates no known allergies.  Home Medications   Current Outpatient Rx   Name  Route  Sig  Dispense  Refill  . metFORMIN (GLUCOPHAGE) 500 MG tablet   Oral   Take 1 tablet (500 mg total) by mouth 2 (two) times daily with a meal.   60 tablet   0    BP 137/90  Pulse 74  Temp(Src) 97.9 F (36.6 C) (Oral)  Resp 12  SpO2 100% Physical Exam Physical Exam  Nursing note and vitals reviewed. Constitutional: He appears well-developed and well-nourished. No distress.  HENT:  Head: Normocephalic and atraumatic.  Eyes: Conjunctivae normal are normal. No scleral icterus.  Neck: Normal range of motion. Neck supple.  Cardiovascular: Normal rate, regular rhythm and normal heart sounds.   Pulmonary/Chest: Effort normal and breath sounds normal. No respiratory distress.  Abdominal: Soft. There is no tenderness.  Musculoskeletal: He exhibits no edema.  Neurological: He is alert.  Skin: Skin is warm and dry. He is not diaphoretic.  BL Ulcerations of the plantar surface. Appears consistent with ulcerated corns. Dermal tissue visible with undermining. Thick callous present.  Tender to palpation.  No purulent or serous discharge noted.  He has bilateral tinea unguium and tinea pedis.  Dorsal pulses intact.   Psychiatric: His behavior is normal.  ; ED Course  Procedures (including critical care time) Labs Review Labs Reviewed  GLUCOSE, CAPILLARY - Abnormal; Notable for the following:    Glucose-Capillary 333 (*)    All other components within normal limits  CBC WITH DIFFERENTIAL - Abnormal; Notable for the following:    MCHC 36.1 (*)    All other components within normal limits  COMPREHENSIVE METABOLIC PANEL - Abnormal; Notable  for the following:    Sodium 134 (*)    Glucose, Bld 365 (*)    Albumin 3.3 (*)    Total Bilirubin 0.2 (*)    GFR calc non Af Amer 81 (*)    All other components within normal limits   Imaging Review No results found.  EKG Interpretation   None       MDM   1. Diabetic foot ulcers   2. Diabetes    Patient seen in shared visit  with Dr. Jodi Mourning. I have placed consult with care management. Labs show no anion gap,  Elevated glucose.  12:30 PM Patient given fluids and insulin with little movement in his blood sugar.  He was given resources for medications and will [facility]. Metformin and cipro at discharge.     Arthor Captain, PA-C 12/01/13 1611

## 2013-12-01 NOTE — ED Notes (Signed)
Pt states he has what he thinks are diabetic ulcers on the bottom of his feet. He states they are developing pus and hurting.

## 2013-12-01 NOTE — ED Notes (Signed)
Patient transported to X-ray 

## 2013-12-02 NOTE — ED Provider Notes (Signed)
Medical screening examination/treatment/procedure(s) were conducted as a shared visit with non-physician practitioner(s) or resident  and myself.  I personally evaluated the patient during the encounter and agree with the findings and plan unless otherwise indicated.    I have personally reviewed any xrays and/ or EKG's with the provider and I agree with interpretation.   Uncontrolled DM, foot ulcers.  Discussed importance of improved DM mgmt and risks long term, pt understands.  Financial issues.  Exam ulceration to bilateral plantar surfaces - no bone palpated, no drainage or warmth, abd soft/ NT, mild dry mm.  Hyperglycemia, fluids given.  Close outpt fup discussed/ arranged with care mgmt.  Pt felt improved in ED.   Enid Skeens, MD 12/02/13 [phone]

## 2013-12-15 ENCOUNTER — Encounter (HOSPITAL_COMMUNITY): Payer: Self-pay | Admitting: Emergency Medicine

## 2013-12-15 ENCOUNTER — Emergency Department (HOSPITAL_COMMUNITY)
Admission: EM | Admit: 2013-12-15 | Discharge: 2013-12-15 | Disposition: A | Discharge status: Home / Self Care | Payer: Managed Care, Other (non HMO) | Attending: Emergency Medicine | Admitting: Emergency Medicine

## 2013-12-15 DIAGNOSIS — L02219 Cutaneous abscess of trunk, unspecified: Secondary | ICD-10-CM | POA: Insufficient documentation

## 2013-12-15 DIAGNOSIS — L0291 Cutaneous abscess, unspecified: Secondary | ICD-10-CM

## 2013-12-15 DIAGNOSIS — E119 Type 2 diabetes mellitus without complications: Secondary | ICD-10-CM | POA: Insufficient documentation

## 2013-12-15 DIAGNOSIS — F17200 Nicotine dependence, unspecified, uncomplicated: Secondary | ICD-10-CM | POA: Insufficient documentation

## 2013-12-15 DIAGNOSIS — Z79899 Other long term (current) drug therapy: Secondary | ICD-10-CM | POA: Insufficient documentation

## 2013-12-15 DIAGNOSIS — I10 Essential (primary) hypertension: Secondary | ICD-10-CM | POA: Insufficient documentation

## 2013-12-15 MED ORDER — OXYCODONE-ACETAMINOPHEN 5-325 MG PO TABS: 2.0000 | ORAL_TABLET | ORAL | Status: DC | PRN

## 2013-12-15 MED ORDER — ONDANSETRON 4 MG PO TBDP
4.0000 mg | ORAL_TABLET | Freq: Once | ORAL | Status: AC
  Administered 2013-12-15: 4 mg via ORAL
  Filled 2013-12-15: qty 1

## 2013-12-15 MED ORDER — MORPHINE SULFATE 4 MG/ML IJ SOLN
4.0000 mg | Freq: Once | INTRAMUSCULAR | Status: AC
  Administered 2013-12-15: 4 mg via INTRAVENOUS
  Filled 2013-12-15: qty 1

## 2013-12-15 MED ORDER — CLINDAMYCIN HCL 150 MG PO CAPS: 300.0000 mg | ORAL_CAPSULE | Freq: Three times a day (TID) | ORAL | Status: DC

## 2013-12-15 NOTE — ED Provider Notes (Signed)
CSN: [id number]     Arrival date & time 12/15/13  1191 History   First MD Initiated Contact with Patient 12/15/13 [phone]     Chief Complaint  Patient presents with  . Abscess    between folds of buttocks    (Consider location/radiation/quality/duration/timing/severity/associated sxs/prior Treatment) HPI Comments: Patient is a 33 year old male with a past medical history of hypertension and diabetes who presents with a 1 week history of a "boil" near his rectum. Symptoms started gradually and progressively worsened since the onset. The area is painful. The pain is throbbing and severe without radiation. Patient reports a history of boils. Patient did not try anything for symptoms. Patient reports sitting makes the pain worse. No alleviating factors. No other associated symptoms.    Past Medical History  Diagnosis Date  . Diabetes mellitus   . Hypertension    History reviewed. No pertinent past surgical history. History reviewed. No pertinent family history. History  Substance Use Topics  . Smoking status: Current Every Day Smoker    Types: Cigars  . Smokeless tobacco: Never Used  . Alcohol Use: Yes    Review of Systems  Constitutional: Negative for fever, chills and fatigue.  HENT: Negative for trouble swallowing.   Eyes: Negative for visual disturbance.  Respiratory: Negative for shortness of breath.   Cardiovascular: Negative for chest pain and palpitations.  Gastrointestinal: Negative for nausea, vomiting, abdominal pain and diarrhea.  Genitourinary: Negative for dysuria and difficulty urinating.  Musculoskeletal: Negative for arthralgias and neck pain.  Skin: Positive for wound. Negative for color change.  Neurological: Negative for dizziness and weakness.  Psychiatric/Behavioral: Negative for dysphoric mood.    Allergies  Review of patient's allergies indicates no known allergies.  Home Medications   Current Outpatient Rx  Name  Route  Sig  Dispense  Refill  .  metFORMIN (GLUCOPHAGE) 500 MG tablet   Oral   Take 1 tablet (500 mg total) by mouth 2 (two) times daily with a meal.   60 tablet   1    BP 153/85  Pulse 78  Temp(Src) 97.9 F (36.6 C) (Oral)  Resp 20  Ht 6\' 2"  (1.88 m)  Wt 220 lb (99.791 kg)  BMI 28.23 kg/m2  SpO2 100% Physical Exam  Nursing note and vitals reviewed. Constitutional: He is oriented to person, place, and time. He appears well-developed and well-nourished. No distress.  HENT:  Head: Normocephalic and atraumatic.  Eyes: Conjunctivae and EOM are normal.  Neck: Normal range of motion.  Cardiovascular: Normal rate and regular rhythm.  Exam reveals no gallop and no friction rub.   No murmur heard. Pulmonary/Chest: Effort normal and breath sounds normal. He has no wheezes. He has no rales. He exhibits no tenderness.  Abdominal: Soft. He exhibits no distension. There is no tenderness. There is no rebound and no guarding.  Genitourinary:  4x3cm area of fluctuance with surrounding induration of left perineal area without extension into the scrotum or rectum. No open wound.   Musculoskeletal: Normal range of motion.  Neurological: He is alert and oriented to person, place, and time. Coordination normal.  Speech is goal-oriented. Moves limbs without ataxia.   Skin: Skin is warm and dry.  Psychiatric: He has a normal mood and affect. His behavior is normal.    ED Course  Procedures (including critical care time)  INCISION AND DRAINAGE Performed by: Emilia Beck Consent: Verbal consent obtained. Risks and benefits: risks, benefits and alternatives were discussed Type: abscess  Body area: perineal  area  Anesthesia: local infiltration  Incision was made with a scalpel.  Local anesthetic: lidocaine 2% with epinephrine  Anesthetic total: 3 ml  Complexity: complex Blunt dissection to break up loculations  Drainage: purulent  Drainage amount: 10 mL  Packing material: none Patient tolerance: Patient  tolerated the procedure well with no immediate complications.     Labs Review Labs Reviewed - No data to display Imaging Review No results found.  EKG Interpretation   None       MDM   1. Abscess and cellulitis     6:55 AM Patient will have perineal abscess drained. Vitals stable and patient afebrile. The abscess does not appear to involve the rectum or scrotum.   7:38 AM Abscess drained without complication. Patient will be discharged with antibiotics and pain medication. Vitals stable and patient afebrile. Patient instructed to return with worsening or concerning symptoms.    Emilia Beck, [place] 12/15/13 [phone]

## 2013-12-15 NOTE — ED Notes (Signed)
Pt has possible abscess below rectum inbetween folds of buttocks

## 2013-12-16 NOTE — ED Provider Notes (Signed)
Medical screening examination/treatment/procedure(s) were performed by non-physician practitioner and as supervising physician I was immediately available for consultation/collaboration.   Loren Racer, MD 12/16/13 [phone]

## 2014-01-14 ENCOUNTER — Encounter (HOSPITAL_COMMUNITY): Payer: Self-pay | Admitting: Emergency Medicine

## 2014-01-14 DIAGNOSIS — Z794 Long term (current) use of insulin: Secondary | ICD-10-CM

## 2014-01-14 DIAGNOSIS — L03119 Cellulitis of unspecified part of limb: Secondary | ICD-10-CM

## 2014-01-14 DIAGNOSIS — IMO0002 Reserved for concepts with insufficient information to code with codable children: Principal | ICD-10-CM | POA: Diagnosis present

## 2014-01-14 DIAGNOSIS — E1165 Type 2 diabetes mellitus with hyperglycemia: Principal | ICD-10-CM | POA: Diagnosis present

## 2014-01-14 DIAGNOSIS — I10 Essential (primary) hypertension: Secondary | ICD-10-CM | POA: Diagnosis present

## 2014-01-14 DIAGNOSIS — E1169 Type 2 diabetes mellitus with other specified complication: Principal | ICD-10-CM

## 2014-01-14 DIAGNOSIS — E876 Hypokalemia: Secondary | ICD-10-CM | POA: Diagnosis present

## 2014-01-14 DIAGNOSIS — Z91199 Patient's noncompliance with other medical treatment and regimen due to unspecified reason: Secondary | ICD-10-CM

## 2014-01-14 DIAGNOSIS — L97509 Non-pressure chronic ulcer of other part of unspecified foot with unspecified severity: Secondary | ICD-10-CM | POA: Diagnosis present

## 2014-01-14 DIAGNOSIS — F17200 Nicotine dependence, unspecified, uncomplicated: Secondary | ICD-10-CM | POA: Diagnosis present

## 2014-01-14 DIAGNOSIS — L02619 Cutaneous abscess of unspecified foot: Secondary | ICD-10-CM | POA: Diagnosis present

## 2014-01-14 DIAGNOSIS — Z9119 Patient's noncompliance with other medical treatment and regimen: Secondary | ICD-10-CM

## 2014-01-14 NOTE — ED Notes (Signed)
Bilateral feet pain  - L > R.  pcp told pt. To come here for xray's of feet - infection to the bone.

## 2014-01-15 ENCOUNTER — Inpatient Hospital Stay (HOSPITAL_COMMUNITY)
Admission: EM | Admit: 2014-01-15 | Discharge: 2014-01-19 | DRG: 638 | Disposition: A | Discharge status: Home / Self Care | Payer: Self-pay | Attending: Internal Medicine | Admitting: Internal Medicine

## 2014-01-15 ENCOUNTER — Emergency Department (HOSPITAL_COMMUNITY): Payer: Self-pay

## 2014-01-15 ENCOUNTER — Encounter (HOSPITAL_COMMUNITY): Payer: Self-pay

## 2014-01-15 DIAGNOSIS — L97509 Non-pressure chronic ulcer of other part of unspecified foot with unspecified severity: Secondary | ICD-10-CM | POA: Diagnosis present

## 2014-01-15 DIAGNOSIS — E11621 Type 2 diabetes mellitus with foot ulcer: Secondary | ICD-10-CM

## 2014-01-15 DIAGNOSIS — L97519 Non-pressure chronic ulcer of other part of right foot with unspecified severity: Secondary | ICD-10-CM

## 2014-01-15 DIAGNOSIS — L8990 Pressure ulcer of unspecified site, unspecified stage: Secondary | ICD-10-CM

## 2014-01-15 DIAGNOSIS — L089 Local infection of the skin and subcutaneous tissue, unspecified: Secondary | ICD-10-CM

## 2014-01-15 DIAGNOSIS — E08621 Diabetes mellitus due to underlying condition with foot ulcer: Secondary | ICD-10-CM

## 2014-01-15 DIAGNOSIS — E1369 Other specified diabetes mellitus with other specified complication: Secondary | ICD-10-CM

## 2014-01-15 DIAGNOSIS — E1169 Type 2 diabetes mellitus with other specified complication: Secondary | ICD-10-CM

## 2014-01-15 DIAGNOSIS — Z720 Tobacco use: Secondary | ICD-10-CM

## 2014-01-15 DIAGNOSIS — E11628 Type 2 diabetes mellitus with other skin complications: Secondary | ICD-10-CM

## 2014-01-15 DIAGNOSIS — I10 Essential (primary) hypertension: Secondary | ICD-10-CM | POA: Diagnosis present

## 2014-01-15 DIAGNOSIS — F17200 Nicotine dependence, unspecified, uncomplicated: Secondary | ICD-10-CM

## 2014-01-15 DIAGNOSIS — Z91199 Patient's noncompliance with other medical treatment and regimen due to unspecified reason: Secondary | ICD-10-CM

## 2014-01-15 DIAGNOSIS — Z9119 Patient's noncompliance with other medical treatment and regimen: Secondary | ICD-10-CM

## 2014-01-15 DIAGNOSIS — L97529 Non-pressure chronic ulcer of other part of left foot with unspecified severity: Secondary | ICD-10-CM

## 2014-01-15 HISTORY — DX: Tobacco use: Z72.0

## 2014-01-15 HISTORY — DX: Local infection of the skin and subcutaneous tissue, unspecified: L08.9

## 2014-01-15 HISTORY — DX: Local infection of the skin and subcutaneous tissue, unspecified: E11.628

## 2014-01-15 LAB — URINALYSIS W MICROSCOPIC + REFLEX CULTURE
BILIRUBIN URINE: NEGATIVE
Glucose, UA: 250 mg/dL — AB
KETONES UR: NEGATIVE mg/dL
Leukocytes, UA: NEGATIVE
Nitrite: NEGATIVE
PH: 5 (ref 5.0–8.0)
Protein, ur: 100 mg/dL — AB
Specific Gravity, Urine: 1.024 (ref 1.005–1.030)
Urobilinogen, UA: 0.2 mg/dL (ref 0.0–1.0)

## 2014-01-15 LAB — CBC WITH DIFFERENTIAL/PLATELET
BASOS ABS: 0 10*3/uL (ref 0.0–0.1)
Basophils Relative: 0 % (ref 0–1)
EOS PCT: 2 % (ref 0–5)
Eosinophils Absolute: 0.2 10*3/uL (ref 0.0–0.7)
HCT: 37.8 % — ABNORMAL LOW (ref 39.0–52.0)
Hemoglobin: 13.4 g/dL (ref 13.0–17.0)
LYMPHS ABS: 2.3 10*3/uL (ref 0.7–4.0)
LYMPHS PCT: 30 % (ref 12–46)
MCH: 29.3 pg (ref 26.0–34.0)
MCHC: 35.4 g/dL (ref 30.0–36.0)
MCV: 82.7 fL (ref 78.0–100.0)
Monocytes Absolute: 0.6 10*3/uL (ref 0.1–1.0)
Monocytes Relative: 8 % (ref 3–12)
NEUTROS PCT: 60 % (ref 43–77)
Neutro Abs: 4.7 10*3/uL (ref 1.7–7.7)
PLATELETS: 204 10*3/uL (ref 150–400)
RBC: 4.57 MIL/uL (ref 4.22–5.81)
RDW: 12.2 % (ref 11.5–15.5)
WBC: 7.8 10*3/uL (ref 4.0–10.5)

## 2014-01-15 LAB — BASIC METABOLIC PANEL
BUN: 14 mg/dL (ref 6–23)
CALCIUM: 9.3 mg/dL (ref 8.4–10.5)
CHLORIDE: 100 meq/L (ref 96–112)
CO2: 24 meq/L (ref 19–32)
CREATININE: 1.13 mg/dL (ref 0.50–1.35)
GFR calc non Af Amer: 84 mL/min — ABNORMAL LOW (ref >90)
Glucose, Bld: 267 mg/dL — ABNORMAL HIGH (ref 70–99)
Potassium: 3.7 mEq/L (ref 3.7–5.3)
SODIUM: 138 meq/L (ref 137–147)

## 2014-01-15 LAB — GLUCOSE, CAPILLARY
GLUCOSE-CAPILLARY: 217 mg/dL — AB (ref 70–99)
Glucose-Capillary: 268 mg/dL — ABNORMAL HIGH (ref 70–99)
Glucose-Capillary: 399 mg/dL — ABNORMAL HIGH (ref 70–99)
Glucose-Capillary: 261 mg/dL — ABNORMAL HIGH (ref 70–99)
Glucose-Capillary: 209 mg/dL — ABNORMAL HIGH (ref 70–99)

## 2014-01-15 LAB — C-REACTIVE PROTEIN

## 2014-01-15 LAB — SEDIMENTATION RATE: SED RATE: 40 mm/h — AB (ref 0–16)

## 2014-01-15 LAB — HEMOGLOBIN A1C
Hgb A1c MFr Bld: 14.1 % — ABNORMAL HIGH (ref <5.7)
Mean Plasma Glucose: 358 mg/dL — ABNORMAL HIGH (ref <117)

## 2014-01-15 LAB — C-PEPTIDE: C PEPTIDE: 1.46 ng/mL (ref 0.80–3.90)

## 2014-01-15 MED ORDER — ACETAMINOPHEN 325 MG PO TABS
650.0000 mg | ORAL_TABLET | Freq: Four times a day (QID) | ORAL | Status: DC | PRN
  Administered 2014-01-17 – 2014-01-19 (×6): 650 mg via ORAL
  Filled 2014-01-15 (×6): qty 2

## 2014-01-15 MED ORDER — VANCOMYCIN HCL IN DEXTROSE 1-5 GM/200ML-% IV SOLN
1000.0000 mg | Freq: Three times a day (TID) | INTRAVENOUS | Status: DC
  Administered 2014-01-15 – 2014-01-19 (×12): 1000 mg via INTRAVENOUS
  Filled 2014-01-15 (×14): qty 200

## 2014-01-15 MED ORDER — SODIUM CHLORIDE 0.9 % IV SOLN: 1000.0000 mL | INTRAVENOUS | Status: DC

## 2014-01-15 MED ORDER — PNEUMOCOCCAL VAC POLYVALENT 25 MCG/0.5ML IJ INJ
0.5000 mL | INJECTION | INTRAMUSCULAR | Status: AC
  Administered 2014-01-16: 0.5 mL via INTRAMUSCULAR
  Filled 2014-01-15: qty 0.5

## 2014-01-15 MED ORDER — SODIUM CHLORIDE 0.9 % IV SOLN: 1000.0000 mL | Freq: Once | INTRAVENOUS | Status: DC

## 2014-01-15 MED ORDER — INSULIN GLARGINE 100 UNIT/ML ~~LOC~~ SOLN
15.0000 [IU] | Freq: Every day | SUBCUTANEOUS | Status: DC
  Administered 2014-01-15: 15 [IU] via SUBCUTANEOUS
  Filled 2014-01-15 (×2): qty 0.15

## 2014-01-15 MED ORDER — SODIUM CHLORIDE 0.9 % IV SOLN
INTRAVENOUS | Status: DC
  Administered 2014-01-15 – 2014-01-19 (×4): via INTRAVENOUS
  Filled 2014-01-15 (×13): qty 1000

## 2014-01-15 MED ORDER — PIPERACILLIN-TAZOBACTAM 3.375 G IVPB 30 MIN
3.3750 g | Freq: Once | INTRAVENOUS | Status: AC
  Administered 2014-01-15: 3.375 g via INTRAVENOUS
  Filled 2014-01-15: qty 50

## 2014-01-15 MED ORDER — ACETAMINOPHEN 650 MG RE SUPP: 650.0000 mg | Freq: Four times a day (QID) | RECTAL | Status: DC | PRN

## 2014-01-15 MED ORDER — INFLUENZA VAC SPLIT QUAD 0.5 ML IM SUSP
0.5000 mL | INTRAMUSCULAR | Status: AC
  Administered 2014-01-16: 0.5 mL via INTRAMUSCULAR
  Filled 2014-01-15: qty 0.5

## 2014-01-15 MED ORDER — ENOXAPARIN SODIUM 40 MG/0.4ML ~~LOC~~ SOLN
40.0000 mg | SUBCUTANEOUS | Status: DC
  Administered 2014-01-15: 40 mg via SUBCUTANEOUS
  Filled 2014-01-15: qty 0.4

## 2014-01-15 MED ORDER — INSULIN ASPART 100 UNIT/ML ~~LOC~~ SOLN
0.0000 [IU] | Freq: Three times a day (TID) | SUBCUTANEOUS | Status: DC
  Administered 2014-01-15: 8 [IU] via SUBCUTANEOUS
  Administered 2014-01-15: 5 [IU] via SUBCUTANEOUS
  Administered 2014-01-15: 15 [IU] via SUBCUTANEOUS
  Administered 2014-01-16: 11 [IU] via SUBCUTANEOUS
  Administered 2014-01-16: 8 [IU] via SUBCUTANEOUS
  Administered 2014-01-17: 5 [IU] via SUBCUTANEOUS
  Administered 2014-01-17: 3 [IU] via SUBCUTANEOUS
  Administered 2014-01-17: 5 [IU] via SUBCUTANEOUS
  Administered 2014-01-18 (×2): 3 [IU] via SUBCUTANEOUS
  Administered 2014-01-19: 5 [IU] via SUBCUTANEOUS

## 2014-01-15 MED ORDER — PIPERACILLIN-TAZOBACTAM 3.375 G IVPB 30 MIN: 3.3750 g | Freq: Three times a day (TID) | INTRAVENOUS | Status: DC

## 2014-01-15 MED ORDER — SODIUM CHLORIDE 0.9 % IV SOLN
1000.0000 mL | Freq: Once | INTRAVENOUS | Status: AC
  Administered 2014-01-15: 1000 mL via INTRAVENOUS

## 2014-01-15 MED ORDER — INSULIN ASPART 100 UNIT/ML ~~LOC~~ SOLN
4.0000 [IU] | Freq: Three times a day (TID) | SUBCUTANEOUS | Status: DC
  Administered 2014-01-15: 4 [IU] via SUBCUTANEOUS

## 2014-01-15 MED ORDER — PIPERACILLIN-TAZOBACTAM 3.375 G IVPB
3.3750 g | Freq: Three times a day (TID) | INTRAVENOUS | Status: DC
  Administered 2014-01-15 – 2014-01-19 (×12): 3.375 g via INTRAVENOUS
  Filled 2014-01-15 (×13): qty 50

## 2014-01-15 MED ORDER — OXYCODONE HCL 5 MG PO TABS
5.0000 mg | ORAL_TABLET | ORAL | Status: DC | PRN
  Administered 2014-01-16 – 2014-01-19 (×5): 5 mg via ORAL
  Filled 2014-01-15 (×5): qty 1

## 2014-01-15 MED ORDER — INSULIN GLARGINE 100 UNIT/ML ~~LOC~~ SOLN: 20.0000 [IU] | Freq: Every day | SUBCUTANEOUS | Status: DC

## 2014-01-15 MED ORDER — VANCOMYCIN HCL IN DEXTROSE 1-5 GM/200ML-% IV SOLN
1000.0000 mg | Freq: Once | INTRAVENOUS | Status: AC
  Administered 2014-01-15: 1000 mg via INTRAVENOUS
  Filled 2014-01-15 (×2): qty 200

## 2014-01-15 MED ORDER — INSULIN ASPART PROT & ASPART (70-30 MIX) 100 UNIT/ML ~~LOC~~ SUSP
15.0000 [IU] | Freq: Two times a day (BID) | SUBCUTANEOUS | Status: DC
  Administered 2014-01-15: 15 [IU] via SUBCUTANEOUS
  Filled 2014-01-15: qty 10

## 2014-01-15 NOTE — H&P (Addendum)
[facility] History and Physical  Dwayne [name] ZOX:[id number] DOB: 24-Jan-1980 DOA: 01/15/2014   PCP: No PCP Per Patient   Chief Complaint: Foot ulcers  HPI:  34 year old male with a history of diabetes mellitus presents with bilateral diabetic foot ulcers, left greater than right. The patient states that the ulcer on his right foot began around June 2014 and ulcer on his left foot began around October 2014. He states that the left foot ulcer has gotten deeper and has been draining malodorous material for the past month. Most recently, the patient was treated with Cipro when he was discharged from the ED on 12/01/2013. Because of financial constraints, the patient has been in and out of multiple EDs for his diabetic foot ulcers. The patient states that he has been on metformin for the better part of a year. Again, most of his prescriptions have been through emergency departments. Today, the patient went to [facility]. He was noted to have a blood sugar of >500. The patient was given 15 units subcutaneous insulin and sent to the emergency department. He states that he was diagnosed as a diabetic at age 63 at which time he was placed on insulin. The patient denies fevers, chills, chest discomfort, shortness of breath, nausea, vomiting, diarrhea, abdominal pain, dysuria, hematuria.  In ED, the patient was started on vancomycin and Zosyn. Serum creatinine was 1.13. BMP otherwise was unremarkable. CBC was unremarkable. Plain films of his left foot were negative for subcutaneous emphysema or foreign body.  Serum glucose was 267 with anion gap 14. Assessment/Plan:  Diabetic foot infection -Particularly concerning in the patient's left foot -positive probe to bone -Concerned about underlying osteomyelitis -MRI bilateral feet -will need ortho consult for I&D -Continue vancomycin and Zosyn -Please obtain bone biopsy if possible during debridement -Blood cultures have been obtained in the  ED -ESR--40, await CRP Diabetes mellitus -Unclear if the patient is a type I or type II, but given the patient's history suspect that he may have been undiagnosed type I or MODY -Check C-peptide -Hemoglobin A1c -NovoLog sliding scale -due to patient's financial situation, start patient on 70/30 insulin--15units bit although he will likely need more -Check UA  -continue IVF-- pt with anion gap 14 Tobacco abuse -Tobacco cessation discussed Active Problems:   Diabetic foot infection       Past Medical History  Diagnosis Date  . Diabetes mellitus   . Hypertension    History reviewed. No pertinent past surgical history. Social History:  reports that he has been smoking Cigars.  He has never used smokeless tobacco. He reports that he drinks alcohol. He reports that he does not use illicit drugs.   History reviewed. No pertinent family history.   No Known Allergies    Prior to Admission medications   Medication Sig Start Date End Date Taking? Authorizing Provider  metFORMIN (GLUCOPHAGE) 500 MG tablet Take 1 tablet (500 mg total) by mouth 2 (two) times daily with a meal. 12/01/13  Yes Dwayne Mail, PA-C    Review of Systems:  Constitutional:  No weight loss, night sweats, Fevers, chills, fatigue.  Head&Eyes: No headache.  No vision loss.  No eye pain or scotoma ENT:  No Difficulty swallowing,Tooth/dental problems,Sore throat,   Cardio-vascular:  No chest pain, Orthopnea, PND, swelling in lower extremities,  dizziness, palpitations  GI:  No  abdominal pain, nausea, vomiting, diarrhea, loss of appetite, hematochezia, melena, heartburn, indigestion, Resp:  No shortness of breath with exertion or at rest. No cough. No coughing  up of blood .No wheezing.No chest wall deformity  Skin:  no rash or lesions.  GU:  no dysuria, change in color of urine, no urgency or frequency. No flank pain.  Musculoskeletal:  No joint pain or swelling. No decreased range of motion. No back  pain.  Psych:  No change in mood or affect. No depression or anxiety. Neurologic: No headache, no dysesthesia, no focal weakness, no vision loss. No syncope  Physical Exam: Filed Vitals:   01/14/14 2025  BP: 135/84  Pulse: 88  Temp: 98.2 F (36.8 C)  TempSrc: Oral  Resp: 14  Height: 6' 2"  (1.88 m)  Weight: 98.431 kg (217 lb)  SpO2: 100%   General:  A&O x 3, NAD, nontoxic, pleasant/cooperative Head/Eye: No conjunctival hemorrhage, no icterus, [place]/AT, No nystagmus ENT:  No icterus,  No thrush,  no pharyngeal exudate Neck:  No masses, no lymphadenpathy, no bruits CV:  RRR, no rub, no gallop, no S3 Lung:  CTAB, good air movement, no wheeze, no rhonchi Abdomen: soft/NT, +BS, nondistended, no peritoneal signs Ext: No cyanosis, No rashes, No petechiae, No lymphangitis; approximate 2 cm ulceration on the left plantar surface head of the third metatarsal area with purulent drainage and tenderness to palpation--no crepitance; right foot plantar ulceration 2 cm without any drainage   Labs on Admission:  Basic Metabolic Panel:  Recent Labs Lab 01/15/14 0205  NA 138  K 3.7  CL 100  CO2 24  GLUCOSE 267*  BUN 14  CREATININE 1.13  CALCIUM 9.3   Liver Function Tests: No results found for this basename: AST, ALT, ALKPHOS, BILITOT, PROT, ALBUMIN,  in the last 168 hours No results found for this basename: LIPASE, AMYLASE,  in the last 168 hours No results found for this basename: AMMONIA,  in the last 168 hours CBC:  Recent Labs Lab 01/15/14 0205  WBC 7.8  NEUTROABS 4.7  HGB 13.4  HCT 37.8*  MCV 82.7  PLT 204   Cardiac Enzymes: No results found for this basename: CKTOTAL, CKMB, CKMBINDEX, TROPONINI,  in the last 168 hours BNP: No components found with this basename: POCBNP,  CBG:  Recent Labs Lab 01/15/14 0207  GLUCAP 268*    Radiological Exams on Admission: Dg Foot Complete Left  01/15/2014   CLINICAL DATA:  Foot ulcer in history of diabetes  EXAM: LEFT FOOT -  COMPLETE 3+ VIEW  COMPARISON:  12/01/2013  FINDINGS: There is an ulcer at the level of the third metatarsal head. No evidence of subcutaneous emphysema. Radiodense foreign body or osseous infection. No acute fracture. Chronic marginal erosion at the first interphalangeal joint.  IMPRESSION: Ulcer of the plantar forefoot. No evidence of foreign body or osseous infection.   Electronically Signed   By: Jorje Guild M.D.   On: 01/15/2014 02:55   Dg Foot Complete Right  01/15/2014   CLINICAL DATA:  Diabetes.  Foot ulcer.  EXAM: RIGHT FOOT COMPLETE - 3+ VIEW  COMPARISON:  12/01/2013  FINDINGS: There is an enlarging ulcer at the level of the plantar third metatarsal head. There is no evidence of radiodense foreign body or spreading subcutaneous emphysema. No joint narrowing or osseous erosion. No acute fracture. Chronic marginal erosion at the great toe interphalangeal joint.  IMPRESSION: Plantar forefoot ulcer without evidence of foreign body or osseous infection   Electronically Signed   By: Jorje Guild M.D.   On: 01/15/2014 02:59        Time spent:60 minutes Code Status:   FULL Family Communication:  spouse at bedside   Dwayne Minasyan, DO  [facility] Pager [phone]  If 7PM-7AM, please contact night-coverage [email or web address] Password [email or web address] 01/15/2014, 3:57 AM

## 2014-01-15 NOTE — Progress Notes (Signed)
Inpatient Diabetes Program Recommendations  AACE/ADA: New Consensus Statement on Inpatient Glycemic Control (2013)  Target Ranges:  Prepandial:   less than 140 mg/dL      Peak postprandial:   less than 180 mg/dL (1-2 hours)      Critically ill patients:  140 - 180 mg/dL   Reason for Visit: Results for Dwayne [name], Dwayne [name] (MRN [id number]) as of 01/15/2014 16:04  Ref. Range 01/15/2014 02:07 01/15/2014 07:49 01/15/2014 12:18  Glucose-Capillary Latest Range: 70-99 mg/dL 409268 (H) 811399 (H) 914217 (H)   Results for Dwayne [name], Dwayne [name] (MRN [id number]) as of 01/15/2014 16:04  Ref. Range 01/15/2014 05:30  Hemoglobin A1C Latest Range: <5.7 % 14.1 (H)   Note elevated A1C.  Discussed Diabetes management with patient .  He states that he was diagnosed with diabetes at age 34.  He was on insulin at initial diagnosis however states he has not been taking insulin for the past 5 years.  He has spot checked his CBG and it has been in the 200-300's.  A1C indicates average CBG=303 mg/dL.     Informed him that he would likely need insulin at discharge and he states that he knows how to administer.  Agree with start of Lantus/Novolog.  May need Match program for medications.  [facility]  Also needs meter for checking CBG's.  Explained importance of CBG control for healing and to prevent long term complications.  He verbalized understanding however states "my blood sugars are okay when I work out".  Hopeful that by having access [facility] will help.  Beryl MeagerJenny Chiquita Heckert, RN, BC-ADM Inpatient Diabetes Coordinator Pager [phone]

## 2014-01-15 NOTE — Progress Notes (Signed)
Patient ID: Dwayne [name]  male  ZOX:[id number]    DOB: 1980/03/04    DOA: 01/15/2014  PCP: No PCP Per Patient  Assessment/Plan:    Diabetic foot infection bilateral - Foot x-rays reviewed, both feet has ulcers on the plantar forefoot with foul-smelling discharge - MRI of the right and left foot ordered - Continue vancomycin and Zosyn - Discussed with Dr. Magnus Ivan, orthopedics, may need a debridement, possibly bone biopsy     Tobacco abuse - Patient counseled against smoking    Diabetes mellitus: Uncontrolled - Placed on Lantus, sliding scale insulin, meal coverage  Medical noncompliance - Per patient and his wife, he has been following with the ER for his regular diabetic care, does not have any PCP, wound care. Patient reports that he does not take any medication except metformin prescription was given to him by the ER.  DVT Prophylaxis:  Code Status: Full code  Family Communication: Discussed with patient and his wife at the bedside  Disposition: Discussed with case manager to make appointment with community wellness center.    Subjective: Patient seen and examined, ulcers on the plantar forefoot bilaterally, foul-smelling discharge.  Objective: Weight change:  No intake or output data in the 24 hours ending 01/15/14 1257 Blood pressure 142/82, pulse 81, temperature 97.8 F (36.6 C), temperature source Oral, resp. rate 16, height 6\' 2"  (1.88 m), weight 98.2 kg (216 lb 7.9 oz), SpO2 100.00%.  Physical Exam: General: Alert and awake, oriented x3, not in any acute distress. CVS: S1-S2 clear, no murmur rubs or gallops Chest: clear to auscultation bilaterally, no wheezing, rales or rhonchi Abdomen: soft nontender, nondistended, normal bowel sounds  Extremities: no cyanosis, clubbing or edema noted bilaterally, about 2 cm ulceration on the plantar surface of both feet with drainage, tenderness to palpation Neuro: Cranial nerves II-XII intact, no focal neurological  deficits  Lab Results: Basic Metabolic Panel:  Recent Labs Lab 01/15/14 0205  NA 138  K 3.7  CL 100  CO2 24  GLUCOSE 267*  BUN 14  CREATININE 1.13  CALCIUM 9.3   Liver Function Tests: No results found for this basename: AST, ALT, ALKPHOS, BILITOT, PROT, ALBUMIN,  in the last 168 hours No results found for this basename: LIPASE, AMYLASE,  in the last 168 hours No results found for this basename: AMMONIA,  in the last 168 hours CBC:  Recent Labs Lab 01/15/14 0205  WBC 7.8  NEUTROABS 4.7  HGB 13.4  HCT 37.8*  MCV 82.7  PLT 204   Cardiac Enzymes: No results found for this basename: CKTOTAL, CKMB, CKMBINDEX, TROPONINI,  in the last 168 hours BNP: No components found with this basename: POCBNP,  CBG:  Recent Labs Lab 01/15/14 0207 01/15/14 0749 01/15/14 1218  GLUCAP 268* 399* 217*     Micro Results: No results found for this or any previous visit (from the past 240 hour(s)).  Studies/Results: Dg Foot Complete Left  01/15/2014   CLINICAL DATA:  Foot ulcer in history of diabetes  EXAM: LEFT FOOT - COMPLETE 3+ VIEW  COMPARISON:  12/01/2013  FINDINGS: There is an ulcer at the level of the third metatarsal head. No evidence of subcutaneous emphysema. Radiodense foreign body or osseous infection. No acute fracture. Chronic marginal erosion at the first interphalangeal joint.  IMPRESSION: Ulcer of the plantar forefoot. No evidence of foreign body or osseous infection.   Electronically Signed   By: Tiburcio Pea M.D.   On: 01/15/2014 02:55   Dg Foot Complete Right  01/15/2014  CLINICAL DATA:  Diabetes.  Foot ulcer.  EXAM: RIGHT FOOT COMPLETE - 3+ VIEW  COMPARISON:  12/01/2013  FINDINGS: There is an enlarging ulcer at the level of the plantar third metatarsal head. There is no evidence of radiodense foreign body or spreading subcutaneous emphysema. No joint narrowing or osseous erosion. No acute fracture. Chronic marginal erosion at the great toe interphalangeal joint.   IMPRESSION: Plantar forefoot ulcer without evidence of foreign body or osseous infection   Electronically Signed   By: Tiburcio PeaJonathan  Watts M.D.   On: 01/15/2014 02:59    Medications: Scheduled Meds: . enoxaparin (LOVENOX) injection  40 mg Subcutaneous Q24H  . insulin aspart  0-15 Units Subcutaneous TID WC  . insulin aspart protamine- aspart  15 Units Subcutaneous BID WC  . piperacillin-tazobactam (ZOSYN)  IV  3.375 g Intravenous Q8H  . vancomycin  1,000 mg Intravenous Q8H      LOS: 0 days   Merced Hanners M.D. [facility] 01/15/2014, 12:57 PM Pager: [phone]  If 7PM-7AM, please contact night-coverage [email or web address] Password [email or web address]

## 2014-01-15 NOTE — ED Provider Notes (Signed)
CSN: [id number]     Arrival date & time 01/14/14  2016 History   First MD Initiated Contact with Patient 01/15/14 0143     Chief Complaint  Patient presents with  . Foot Pain  . Foot Ulcer   (Consider location/radiation/quality/duration/timing/severity/associated sxs/prior Treatment) HPI 34 year old male presents to emergency department with complaint of worsening diabetic ulcers.  He, reports he's had ulcers for many months to years.  Over the last several weeks, however, he has had increasing size to the ulcers, along with drainage, left greater than right.  He denies a loss of sensation to his feet, and reports the ulcers are painful at times.  He denies any fever or chills.  He was seen at his new primary care doctor's office today, and noted to have a blood sugar over 500.  He was instructed to go to the emergency department for further evaluation of possible osteomyelitis.  Patient reports he is currently taking metformin 500 mg twice a day.  He reports in the past.  He had been on insulin.  He's had no significant medical care the last several years due to incarceration and lack of insurance Past Medical History  Diagnosis Date  . Diabetes mellitus   . Hypertension    History reviewed. No pertinent past surgical history. History reviewed. No pertinent family history. History  Substance Use Topics  . Smoking status: Current Every Day Smoker    Types: Cigars  . Smokeless tobacco: Never Used  . Alcohol Use: Yes    Review of Systems  See History of Present Illness; otherwise all other systems are reviewed and negative Allergies  Review of patient's allergies indicates no known allergies.  Home Medications   Current Outpatient Rx  Name  Route  Sig  Dispense  Refill  . metFORMIN (GLUCOPHAGE) 500 MG tablet   Oral   Take 1 tablet (500 mg total) by mouth 2 (two) times daily with a meal.   60 tablet   1    BP 135/84  Pulse 88  Temp(Src) 98.2 F (36.8 C) (Oral)  Resp 14  Ht  6\' 2"  (1.88 m)  Wt 217 lb (98.431 kg)  BMI 27.85 kg/m2  SpO2 100% Physical Exam  Nursing note and vitals reviewed. Constitutional: He is oriented to person, place, and time. He appears well-developed and well-nourished. No distress.  HENT:  Head: Normocephalic and atraumatic.  Nose: Nose normal.  Mouth/Throat: Oropharynx is clear and moist.  Eyes: Conjunctivae and EOM are normal. Pupils are equal, round, and reactive to light.  Neck: Normal range of motion. Neck supple. No JVD present. No tracheal deviation present. No thyromegaly present.  Cardiovascular: Normal rate, regular rhythm, normal heart sounds and intact distal pulses.  Exam reveals no gallop and no friction rub.   No murmur heard. Pulmonary/Chest: Effort normal and breath sounds normal. No stridor. No respiratory distress. He has no wheezes. He has no rales. He exhibits no tenderness.  Abdominal: Soft. Bowel sounds are normal. He exhibits no distension and no mass. There is no tenderness. There is no rebound and no guarding.  Musculoskeletal: Normal range of motion. He exhibits tenderness. He exhibits no edema.  Patient with ulcerations noted to bilateral plantars aspect of feet.  Left ulcer is 2 cm, and goes into the deep dermis.  This ulcer has some purulent exudate and induration inferiorly.  The right.  Ulcer is also 2 cm and also because of the deep dermis.  The base of this wound is dry without  signs of acute active infection  Lymphadenopathy:    He has no cervical adenopathy.  Neurological: He is alert and oriented to person, place, and time. He exhibits normal muscle tone. Coordination normal.  Skin: Skin is warm and dry. No rash noted. No erythema. No pallor.  Psychiatric: He has a normal mood and affect. His behavior is normal. Judgment and thought content normal.    ED Course  Procedures (including critical care time) Labs Review Labs Reviewed  CBC WITH DIFFERENTIAL - Abnormal; Notable for the following:    HCT  37.8 (*)    All other components within normal limits  SEDIMENTATION RATE - Abnormal; Notable for the following:    Sed Rate 40 (*)    All other components within normal limits  BASIC METABOLIC PANEL - Abnormal; Notable for the following:    Glucose, Bld 267 (*)    GFR calc non Af Amer 84 (*)    All other components within normal limits  GLUCOSE, CAPILLARY - Abnormal; Notable for the following:    Glucose-Capillary 268 (*)    All other components within normal limits  CULTURE, BLOOD (ROUTINE X 2)  CULTURE, BLOOD (ROUTINE X 2)  C-REACTIVE PROTEIN  C-PEPTIDE  HEMOGLOBIN A1C  URINALYSIS W MICROSCOPIC + REFLEX CULTURE         Imaging Review Dg Foot Complete Left  01/15/2014   CLINICAL DATA:  Foot ulcer in history of diabetes  EXAM: LEFT FOOT - COMPLETE 3+ VIEW  COMPARISON:  12/01/2013  FINDINGS: There is an ulcer at the level of the third metatarsal head. No evidence of subcutaneous emphysema. Radiodense foreign body or osseous infection. No acute fracture. Chronic marginal erosion at the first interphalangeal joint.  IMPRESSION: Ulcer of the plantar forefoot. No evidence of foreign body or osseous infection.   Electronically Signed   By: Tiburcio Pea M.D.   On: 01/15/2014 02:55   Dg Foot Complete Right  01/15/2014   CLINICAL DATA:  Diabetes.  Foot ulcer.  EXAM: RIGHT FOOT COMPLETE - 3+ VIEW  COMPARISON:  12/01/2013  FINDINGS: There is an enlarging ulcer at the level of the plantar third metatarsal head. There is no evidence of radiodense foreign body or spreading subcutaneous emphysema. No joint narrowing or osseous erosion. No acute fracture. Chronic marginal erosion at the great toe interphalangeal joint.  IMPRESSION: Plantar forefoot ulcer without evidence of foreign body or osseous infection   Electronically Signed   By: Tiburcio Pea M.D.   On: 01/15/2014 02:59    EKG Interpretation   None       MDM   1. Diabetic ulcer of both feet   2. Infected pressure ulcer   3.  Diabetes mellitus due to underlying condition with foot ulcer   4. Tobacco abuse    34 year old male with diabetic ulcers of both feet, with infection of the left.  He has poorly controlled diabetes, and poor access to care.  Will start antibiotics, discuss with hospitalist for admission.  Blood cultures have been sent, along with CRP and sedimentation rate.  X-rays without shots of osteomyelitis.    Olivia Mackie, MD 01/15/14 [phone]

## 2014-01-15 NOTE — Care Management Note (Signed)
    Page 1 of 2   01/18/2014     10:30:38 AM   CARE MANAGEMENT NOTE 01/18/2014  Patient:  Dwayne [name],Dwayne [name]   Account Number:  [id number]  Date Initiated:  01/15/2014  Documentation initiated by:  GRAVES-BIGELOW,Kaytie Ratcliffe  Subjective/Objective Assessment:   Pt admitted for diabetic foot ulcers and increased blood sugar. Pt is currently on metformin. No established PCP at this time. Per pt he has not worked since last Jan and is process of trying to get medicaid. [facility]     Action/Plan:   CM [facility] at 0945 and orange card apt for Fe. 6 at 1130. Eligibility form will be given to pt to have filled out.   Anticipated DC Date:  01/18/2014   Anticipated DC Plan:  HOME W HOME HEALTH SERVICES  In-house referral  Financial Counselor      DC Planning Services  CM consult  GCCN / P4HM (established/new)  Follow-up appt scheduled      [facility]   Choice offered to / List presented to:  C-1 Patient        HH arranged  HH-1 RN  HH-10 DISEASE MANAGEMENT  HH-6 SOCIAL WORKER      HH agency  [facility]   Status of service:  Completed, signed off Medicare Important Message given?   (If response is "NO", the following Medicare IM given date fields will be blank) Date Medicare IM given:   Date Additional Medicare IM given:    Discharge Disposition:  HOME W HOME HEALTH SERVICES  Per UR Regulation:  Reviewed for med. necessity/level of care/duration of stay  If discussed at Long Length of Stay Meetings, dates discussed:    Comments:  01-18-14 1023 Tomi BambergerBrenda Graves-Bigelow, RN, BSN [phone] CM did [facility] pt [facility] and [facility]. CM did make referral for services. SOC to begin within 24-48 hrs post d/c. CM did call FC for assistance with medicaid vs disability. CM also called Diabetes Coordinator in ref to insulin 70/30 and cost may be  better for this pt. CM to continue to monitor for additional needs.

## 2014-01-15 NOTE — Consult Note (Signed)
Reason for Consult:  Bilateral plantar foot ulcers Referring Physician: [facility]  Dwayne [name] is an 34 y.o. male.  HPI:   34 yo male with diabetes (poorly controlled) and chronic foot wounds.  One has been there since July and one since October.  Has not had good foot care.  Hospitalized now due to his high blood glucose and his foot wounds.  Ortho asked to evaluate the foot ulcers.  Past Medical History  Diagnosis Date  . Diabetes mellitus   . Hypertension     History reviewed. No pertinent past surgical history.  History reviewed. No pertinent family history.  Social History:  reports that he has been smoking Cigars.  He has never used smokeless tobacco. He reports that he drinks alcohol. He reports that he does not use illicit drugs.  Allergies: No Known Allergies  Medications: I have reviewed the patient's current medications.  Results for orders placed during the hospital encounter of 01/15/14 (from the past 48 hour(s))  CBC WITH DIFFERENTIAL     Status: Abnormal   Collection Time    01/15/14  2:05 AM      Result Value Range   WBC 7.8  4.0 - 10.5 K/uL   RBC 4.57  4.22 - 5.81 MIL/uL   Hemoglobin 13.4  13.0 - 17.0 g/dL   HCT 37.8 (*) 39.0 - 52.0 %   MCV 82.7  78.0 - 100.0 fL   MCH 29.3  26.0 - 34.0 pg   MCHC 35.4  30.0 - 36.0 g/dL   RDW 12.2  11.5 - 15.5 %   Platelets 204  150 - 400 K/uL   Neutrophils Relative % 60  43 - 77 %   Neutro Abs 4.7  1.7 - 7.7 K/uL   Lymphocytes Relative 30  12 - 46 %   Lymphs Abs 2.3  0.7 - 4.0 K/uL   Monocytes Relative 8  3 - 12 %   Monocytes Absolute 0.6  0.1 - 1.0 K/uL   Eosinophils Relative 2  0 - 5 %   Eosinophils Absolute 0.2  0.0 - 0.7 K/uL   Basophils Relative 0  0 - 1 %   Basophils Absolute 0.0  0.0 - 0.1 K/uL  SEDIMENTATION RATE     Status: Abnormal   Collection Time    01/15/14  2:05 AM      Result Value Range   Sed Rate 40 (*) 0 - 16 mm/hr  BASIC METABOLIC PANEL     Status: Abnormal   Collection Time   01/15/14  2:05 AM      Result Value Range   Sodium 138  137 - 147 mEq/L   Potassium 3.7  3.7 - 5.3 mEq/L   Chloride 100  96 - 112 mEq/L   CO2 24  19 - 32 mEq/L   Glucose, Bld 267 (*) 70 - 99 mg/dL   BUN 14  6 - 23 mg/dL   Creatinine, Ser 1.13  0.50 - 1.35 mg/dL   Calcium 9.3  8.4 - 10.5 mg/dL   GFR calc non Af Amer 84 (*) >90 mL/min   GFR calc Af Amer >90  >90 mL/min   Comment: (NOTE)     The eGFR has been calculated using the CKD EPI equation.     This calculation has not been validated in all clinical situations.     eGFR's persistently <90 mL/min signify possible Chronic Kidney     Disease.  C-REACTIVE PROTEIN     Status: Abnormal  Collection Time    01/15/14  2:05 AM      Result Value Range   CRP <0.5 (*) <0.60 mg/dL   Comment: Performed at [place], CAPILLARY     Status: Abnormal   Collection Time    01/15/14  2:07 AM      Result Value Range   Glucose-Capillary 268 (*) 70 - 99 mg/dL  C-PEPTIDE     Status: None   Collection Time    01/15/14  5:30 AM      Result Value Range   C-Peptide 1.46  0.80 - 3.90 ng/mL   Comment: Performed at [facility] A1C     Status: Abnormal   Collection Time    01/15/14  5:30 AM      Result Value Range   Hemoglobin A1C 14.1 (*) <5.7 %   Comment: (NOTE)                                                                               According to the ADA Clinical Practice Recommendations for 2011, when     HbA1c is used as a screening test:      >=6.5%   Diagnostic of Diabetes Mellitus               (if abnormal result is confirmed)     5.7-6.4%   Increased risk of developing Diabetes Mellitus     References:Diagnosis and Classification of Diabetes Mellitus,Diabetes     GEXB,2841,32(GMWNU 1):S62-S69 and Standards of Medical Care in             Diabetes - 2011,Diabetes Care,2011,34 (Suppl 1):S11-S61.   Mean Plasma Glucose 358 (*) <117 mg/dL   Comment: Performed at [place],  CAPILLARY     Status: Abnormal   Collection Time    01/15/14  7:49 AM      Result Value Range   Glucose-Capillary 399 (*) 70 - 99 mg/dL  GLUCOSE, CAPILLARY     Status: Abnormal   Collection Time    01/15/14 12:18 PM      Result Value Range   Glucose-Capillary 217 (*) 70 - 99 mg/dL  GLUCOSE, CAPILLARY     Status: Abnormal   Collection Time    01/15/14  4:55 PM      Result Value Range   Glucose-Capillary 261 (*) 70 - 99 mg/dL    Dg Foot Complete Left  01/15/2014   CLINICAL DATA:  Foot ulcer in history of diabetes  EXAM: LEFT FOOT - COMPLETE 3+ VIEW  COMPARISON:  12/01/2013  FINDINGS: There is an ulcer at the level of the third metatarsal head. No evidence of subcutaneous emphysema. Radiodense foreign body or osseous infection. No acute fracture. Chronic marginal erosion at the first interphalangeal joint.  IMPRESSION: Ulcer of the plantar forefoot. No evidence of foreign body or osseous infection.   Electronically Signed   By: Jorje Guild M.D.   On: 01/15/2014 02:55   Dg Foot Complete Right  01/15/2014   CLINICAL DATA:  Diabetes.  Foot ulcer.  EXAM: RIGHT FOOT COMPLETE - 3+ VIEW  COMPARISON:  12/01/2013  FINDINGS: There is an enlarging ulcer at  the level of the plantar third metatarsal head. There is no evidence of radiodense foreign body or spreading subcutaneous emphysema. No joint narrowing or osseous erosion. No acute fracture. Chronic marginal erosion at the great toe interphalangeal joint.  IMPRESSION: Plantar forefoot ulcer without evidence of foreign body or osseous infection   Electronically Signed   By: Jorje Guild M.D.   On: 01/15/2014 02:59    ROS Blood pressure 148/92, pulse 75, temperature 97.8 F (36.6 C), temperature source Oral, resp. rate 16, height 6' 2"  (1.88 m), weight 98.2 kg (216 lb 7.9 oz), SpO2 100.00%. Physical Exam  Musculoskeletal:       Feet:    Both feet have full-thickness plantar ulcers with calloused tissue and bad  odor.   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  Given the nature of these wounds, he needs an irrigation and debridement of both of the ulcers in a controlled environment.  Will plan on surgery 01/16/14.  [name] 01/15/2014, 8:39 PM

## 2014-01-15 NOTE — Plan of Care (Signed)
Problem: Food- and Nutrition-Related Knowledge Deficit (NB-1.1) Goal: Nutrition education Formal process to instruct or train a patient/client in a skill or to impart knowledge to help patients/clients voluntarily manage or modify food choices and eating behavior to maintain or improve health. Outcome: Completed/Met Date Met:  01/15/14  RD provided nutrition education regarding diabetes. Spoke with patient and his wife.     No results found for this basename: HGBA1C    RD provided "Carbohydrate Counting for People with Diabetes" handout from the Academy of Nutrition and Dietetics. Discussed different food groups and their effects on blood sugar, emphasizing carbohydrate-containing foods. Provided list of carbohydrates and recommended serving sizes of common foods.  Discussed importance of controlled and consistent carbohydrate intake throughout the day. Provided examples of ways to balance meals/snacks and encouraged intake of high-fiber, whole grain complex carbohydrates. Teach back method used.  Expect poor compliance. Patient did not seem very interested in making dietary changes.   Body mass index is 27.78 kg/(m^2). Pt meets criteria for overweight based on current BMI.  Patient reports some intentional weight loss. Appetite and PO intake were good PTA.  Current diet order is CHO-modified, patient is consuming approximately 100% of meals at this time. Labs and medications reviewed. No further nutrition interventions warranted at this time. RD contact information provided. If additional nutrition issues arise, please re-consult RD.  Molli Barrows, RD, LDN, [place] Pager [phone] After Hours Pager [phone]

## 2014-01-15 NOTE — ED Notes (Signed)
Applied wet to dry dressing to bottom of both feet.

## 2014-01-15 NOTE — Progress Notes (Signed)
ANTIBIOTIC CONSULT NOTE - INITIAL  Pharmacy Consult for vancomycin Indication: r/o osteomyelitis  No Known Allergies  Patient Measurements: Height: 6\' 2"  (188 cm) Weight: 216 lb 7.9 oz (98.2 kg) IBW/kg (Calculated) : 82.2  Vital Signs: Temp: 97.8 F (36.6 C) (01/16 0501) Temp src: Oral (01/16 0501) BP: 142/82 mmHg (01/16 0501) Pulse Rate: 81 (01/16 0501)  Labs:  Recent Labs  01/15/14 0205  WBC 7.8  HGB 13.4  PLT 204  CREATININE 1.13   Estimated Creatinine Clearance: 108.1 ml/min (by C-G formula based on Cr of 1.13).   Microbiology: No results found for this or any previous visit (from the past 720 hour(s)).  Medical History: Past Medical History  Diagnosis Date  . Diabetes mellitus   . Hypertension     Medications:  Prescriptions prior to admission  Medication Sig Dispense Refill  . metFORMIN (GLUCOPHAGE) 500 MG tablet Take 1 tablet (500 mg total) by mouth 2 (two) times daily with a meal.  60 tablet  1   Scheduled:  . enoxaparin (LOVENOX) injection  40 mg Subcutaneous Q24H  . insulin aspart  0-15 Units Subcutaneous TID WC  . insulin aspart protamine- aspart  15 Units Subcutaneous BID WC  . piperacillin-tazobactam  3.375 g Intravenous Once  . piperacillin-tazobactam (ZOSYN)  IV  3.375 g Intravenous Q8H  . vancomycin  1,000 mg Intravenous Once  . vancomycin  1,000 mg Intravenous Q8H   Infusions:  . sodium chloride 0.9 % 1,000 mL with potassium chloride 20 mEq infusion      Assessment: 33yo male c/o bilateral foot pain, found w/ diabetic foot ulcers L>R w/ malodorous draining material, has been seen in ED multiple times for diabetic ulcers, exam concerning for osteo, Xray initially negative, to begin IV ABX.  Goal of Therapy:  Vancomycin trough level 15-20 mcg/ml  Plan:  Will begin vancomycin 1000mg  IV Q8H and monitor CBC, Cx, levels prn.  Vernard GamblesVeronda Avanni Turnbaugh, PharmD, BCPS  01/15/2014,5:08 AM

## 2014-01-16 ENCOUNTER — Inpatient Hospital Stay (HOSPITAL_COMMUNITY): Payer: Self-pay | Admitting: Certified Registered Nurse Anesthetist

## 2014-01-16 ENCOUNTER — Inpatient Hospital Stay (HOSPITAL_COMMUNITY): Payer: Self-pay

## 2014-01-16 ENCOUNTER — Encounter (HOSPITAL_COMMUNITY): Payer: Self-pay | Admitting: Certified Registered Nurse Anesthetist

## 2014-01-16 ENCOUNTER — Encounter (HOSPITAL_COMMUNITY)
Admission: EM | Disposition: A | Discharge status: Home / Self Care | Payer: Self-pay | Source: Home / Self Care | Attending: Internal Medicine

## 2014-01-16 HISTORY — PX: I&D EXTREMITY: SHX5045

## 2014-01-16 LAB — CBC
HCT: 31.3 % — ABNORMAL LOW (ref 39.0–52.0)
Hemoglobin: 11 g/dL — ABNORMAL LOW (ref 13.0–17.0)
MCH: 29.2 pg (ref 26.0–34.0)
MCHC: 35.1 g/dL (ref 30.0–36.0)
MCV: 83 fL (ref 78.0–100.0)
Platelets: 159 10*3/uL (ref 150–400)
RBC: 3.77 MIL/uL — ABNORMAL LOW (ref 4.22–5.81)
RDW: 12.4 % (ref 11.5–15.5)
WBC: 5.2 10*3/uL (ref 4.0–10.5)

## 2014-01-16 LAB — BASIC METABOLIC PANEL
BUN: 16 mg/dL (ref 6–23)
CO2: 24 mEq/L (ref 19–32)
CREATININE: 1.3 mg/dL (ref 0.50–1.35)
Calcium: 8.3 mg/dL — ABNORMAL LOW (ref 8.4–10.5)
Chloride: 106 mEq/L (ref 96–112)
GFR, EST AFRICAN AMERICAN: 82 mL/min — AB (ref >90)
GFR, EST NON AFRICAN AMERICAN: 71 mL/min — AB (ref >90)
Glucose, Bld: 358 mg/dL — ABNORMAL HIGH (ref 70–99)
POTASSIUM: 3.8 meq/L (ref 3.7–5.3)
Sodium: 139 mEq/L (ref 137–147)

## 2014-01-16 LAB — GLUCOSE, CAPILLARY
GLUCOSE-CAPILLARY: 340 mg/dL — AB (ref 70–99)
GLUCOSE-CAPILLARY: 186 mg/dL — AB (ref 70–99)
GLUCOSE-CAPILLARY: 253 mg/dL — AB (ref 70–99)
Glucose-Capillary: 208 mg/dL — ABNORMAL HIGH (ref 70–99)
Glucose-Capillary: 180 mg/dL — ABNORMAL HIGH (ref 70–99)
Glucose-Capillary: 260 mg/dL — ABNORMAL HIGH (ref 70–99)

## 2014-01-16 SURGERY — IRRIGATION AND DEBRIDEMENT EXTREMITY
Anesthesia: General | Site: Foot | Laterality: Bilateral

## 2014-01-16 MED ORDER — SODIUM CHLORIDE 0.9 % IR SOLN
Status: DC | PRN
  Administered 2014-01-16: 1000 mL

## 2014-01-16 MED ORDER — PROPOFOL 10 MG/ML IV BOLUS
INTRAVENOUS | Status: DC | PRN
  Administered 2014-01-16: 200 mg via INTRAVENOUS

## 2014-01-16 MED ORDER — HYDROMORPHONE HCL PF 1 MG/ML IJ SOLN: 0.2500 mg | INTRAMUSCULAR | Status: DC | PRN

## 2014-01-16 MED ORDER — OXYCODONE HCL 5 MG PO TABS: 5.0000 mg | ORAL_TABLET | Freq: Once | ORAL | Status: DC | PRN

## 2014-01-16 MED ORDER — INSULIN ASPART 100 UNIT/ML ~~LOC~~ SOLN
6.0000 [IU] | Freq: Three times a day (TID) | SUBCUTANEOUS | Status: DC
  Administered 2014-01-16 – 2014-01-17 (×2): 6 [IU] via SUBCUTANEOUS

## 2014-01-16 MED ORDER — GADOBENATE DIMEGLUMINE 529 MG/ML IV SOLN
20.0000 mL | Freq: Once | INTRAVENOUS | Status: AC | PRN
  Administered 2014-01-16: 20 mL via INTRAVENOUS

## 2014-01-16 MED ORDER — MIDAZOLAM HCL 5 MG/5ML IJ SOLN
INTRAMUSCULAR | Status: DC | PRN
  Administered 2014-01-16: 2 mg via INTRAVENOUS

## 2014-01-16 MED ORDER — LIDOCAINE HCL (CARDIAC) 20 MG/ML IV SOLN
INTRAVENOUS | Status: DC | PRN
  Administered 2014-01-16: 40 mg via INTRAVENOUS

## 2014-01-16 MED ORDER — ONDANSETRON HCL 4 MG/2ML IJ SOLN
INTRAMUSCULAR | Status: DC | PRN
  Administered 2014-01-16: 4 mg via INTRAVENOUS

## 2014-01-16 MED ORDER — ONDANSETRON HCL 4 MG/2ML IJ SOLN: 4.0000 mg | Freq: Once | INTRAMUSCULAR | Status: DC | PRN

## 2014-01-16 MED ORDER — FENTANYL CITRATE 0.05 MG/ML IJ SOLN
INTRAMUSCULAR | Status: DC | PRN
  Administered 2014-01-16: 100 ug via INTRAVENOUS

## 2014-01-16 MED ORDER — OXYCODONE HCL 5 MG/5ML PO SOLN: 5.0000 mg | Freq: Once | ORAL | Status: DC | PRN

## 2014-01-16 MED ORDER — LACTATED RINGERS IV SOLN
INTRAVENOUS | Status: DC | PRN
  Administered 2014-01-16: 13:00:00 via INTRAVENOUS

## 2014-01-16 MED ORDER — LACTATED RINGERS IV SOLN
INTRAVENOUS | Status: DC
  Administered 2014-01-16: 13:00:00 via INTRAVENOUS

## 2014-01-16 MED ORDER — INSULIN GLARGINE 100 UNIT/ML ~~LOC~~ SOLN
20.0000 [IU] | Freq: Every day | SUBCUTANEOUS | Status: DC
  Administered 2014-01-16: 20 [IU] via SUBCUTANEOUS
  Filled 2014-01-16 (×2): qty 0.2

## 2014-01-16 SURGICAL SUPPLY — 58 items
BANDAGE CONFORM 3  STR LF (GAUZE/BANDAGES/DRESSINGS) IMPLANT
BANDAGE ELASTIC 3 VELCRO ST LF (GAUZE/BANDAGES/DRESSINGS) IMPLANT
BANDAGE GAUZE ELAST BULKY 4 IN (GAUZE/BANDAGES/DRESSINGS) ×6 IMPLANT
BLADE SURG 10 STRL SS (BLADE) ×3 IMPLANT
BNDG COHESIVE 1X5 TAN STRL LF (GAUZE/BANDAGES/DRESSINGS) IMPLANT
BNDG COHESIVE 4X5 TAN STRL (GAUZE/BANDAGES/DRESSINGS) ×9 IMPLANT
BNDG COHESIVE 6X5 TAN STRL LF (GAUZE/BANDAGES/DRESSINGS) IMPLANT
BNDG GAUZE STRTCH 6 (GAUZE/BANDAGES/DRESSINGS) IMPLANT
CLOTH BEACON ORANGE TIMEOUT ST (SAFETY) ×3 IMPLANT
CORDS BIPOLAR (ELECTRODE) IMPLANT
COVER SURGICAL LIGHT HANDLE (MISCELLANEOUS) ×3 IMPLANT
CUFF TOURNIQUET SINGLE 18IN (TOURNIQUET CUFF) IMPLANT
CUFF TOURNIQUET SINGLE 24IN (TOURNIQUET CUFF) IMPLANT
CUFF TOURNIQUET SINGLE 34IN LL (TOURNIQUET CUFF) IMPLANT
CUFF TOURNIQUET SINGLE 44IN (TOURNIQUET CUFF) IMPLANT
DRAPE ORTHO SPLIT 77X108 STRL (DRAPES) ×4
DRAPE SURG 17X23 STRL (DRAPES) IMPLANT
DRAPE SURG ORHT 6 SPLT 77X108 (DRAPES) ×2 IMPLANT
DRAPE U-SHAPE 47X51 STRL (DRAPES) ×3 IMPLANT
DURAPREP 26ML APPLICATOR (WOUND CARE) ×3 IMPLANT
ELECT CAUTERY BLADE 6.4 (BLADE) IMPLANT
ELECT REM PT RETURN 9FT ADLT (ELECTROSURGICAL)
ELECTRODE REM PT RTRN 9FT ADLT (ELECTROSURGICAL) IMPLANT
GAUZE XEROFORM 1X8 LF (GAUZE/BANDAGES/DRESSINGS) IMPLANT
GAUZE XEROFORM 5X9 LF (GAUZE/BANDAGES/DRESSINGS) ×3 IMPLANT
GLOVE BIO SURGEON STRL SZ8 (GLOVE) ×3 IMPLANT
GLOVE BIOGEL PI IND STRL 8 (GLOVE) ×2 IMPLANT
GLOVE BIOGEL PI INDICATOR 8 (GLOVE) ×4
GLOVE ORTHO TXT STRL SZ7.5 (GLOVE) ×3 IMPLANT
GOWN PREVENTION PLUS LG XLONG (DISPOSABLE) IMPLANT
GOWN PREVENTION PLUS XLARGE (GOWN DISPOSABLE) ×6 IMPLANT
GOWN STRL NON-REIN LRG LVL3 (GOWN DISPOSABLE) ×3 IMPLANT
HANDPIECE INTERPULSE COAX TIP (DISPOSABLE)
KIT BASIN OR (CUSTOM PROCEDURE TRAY) ×3 IMPLANT
KIT ROOM TURNOVER OR (KITS) ×3 IMPLANT
MANIFOLD NEPTUNE II (INSTRUMENTS) ×3 IMPLANT
NS IRRIG 1000ML POUR BTL (IV SOLUTION) ×3 IMPLANT
PACK ORTHO EXTREMITY (CUSTOM PROCEDURE TRAY) ×3 IMPLANT
PAD ARMBOARD 7.5X6 YLW CONV (MISCELLANEOUS) ×6 IMPLANT
PADDING CAST ABS 4INX4YD NS (CAST SUPPLIES) ×2
PADDING CAST ABS COTTON 4X4 ST (CAST SUPPLIES) ×1 IMPLANT
PADDING CAST COTTON 6X4 STRL (CAST SUPPLIES) IMPLANT
SET HNDPC FAN SPRY TIP SCT (DISPOSABLE) IMPLANT
SPONGE GAUZE 4X4 12PLY (GAUZE/BANDAGES/DRESSINGS) ×9 IMPLANT
SPONGE LAP 18X18 X RAY DECT (DISPOSABLE) ×3 IMPLANT
STOCKINETTE IMPERVIOUS 9X36 MD (GAUZE/BANDAGES/DRESSINGS) ×3 IMPLANT
SUT ETHILON 2 0 FS 18 (SUTURE) IMPLANT
SUT ETHILON 3 0 PS 1 (SUTURE) IMPLANT
SYR CONTROL 10ML LL (SYRINGE) IMPLANT
TOWEL OR 17X24 6PK STRL BLUE (TOWEL DISPOSABLE) ×3 IMPLANT
TOWEL OR 17X26 10 PK STRL BLUE (TOWEL DISPOSABLE) ×3 IMPLANT
TUBE ANAEROBIC SPECIMEN COL (MISCELLANEOUS) IMPLANT
TUBE CONNECTING 12'X1/4 (SUCTIONS) ×1
TUBE CONNECTING 12X1/4 (SUCTIONS) ×2 IMPLANT
TUBE FEEDING 5FR 15 INCH (TUBING) IMPLANT
UNDERPAD 30X30 INCONTINENT (UNDERPADS AND DIAPERS) ×3 IMPLANT
WATER STERILE IRR 1000ML POUR (IV SOLUTION) ×3 IMPLANT
YANKAUER SUCT BULB TIP NO VENT (SUCTIONS) ×3 IMPLANT

## 2014-01-16 NOTE — Brief Op Note (Signed)
01/15/2014 - 01/16/2014  1:54 PM  PATIENT:  Dwayne HickmanBenjamin [name]  34 y.o. male  PRE-OPERATIVE DIAGNOSIS:  bilateral plantar ulcers  POST-OPERATIVE DIAGNOSIS:  bilateral foot ulcers and infection  PROCEDURE:  Procedure(s):  DEBRIDEMENT of bilateral foot ulcers (Bilateral)  SURGEON:  Surgeon(s) and Role:    * Kathryne Hitchhristopher Y Aleric Froelich, MD - Primary  PHYSICIAN ASSISTANT: Rexene EdisonGil Clark, PA-C  ANESTHESIA:   spinal  EBL:  Total I/O In: 550 [I.V.:550] Out: -   BLOOD ADMINISTERED:none  DRAINS: none   LOCAL MEDICATIONS USED:  NONE  SPECIMEN:  No Specimen  DISPOSITION OF SPECIMEN:  N/A  COUNTS:  YES  TOURNIQUET:  * No tourniquets in log *  DICTATION: .Other Dictation: Dictation Number [id number]  PLAN OF CARE: Admit to inpatient   PATIENT DISPOSITION:  PACU - hemodynamically stable.   Delay start of Pharmacological VTE agent (>24hrs) due to surgical blood loss or risk of bleeding: not applicable

## 2014-01-16 NOTE — Op Note (Signed)
[name]:  [name], Dwayne          ACCOUNT NO.:  [id number]  MEDICAL RECORD NO.:  [id number]  LOCATION:  [id number]                        FACILITY:  [facility]  PHYSICIAN:  Dwayne PandaChristopher Y. Magnus [name], M.D.DATE OF BIRTH:  [birth year]  DATE OF PROCEDURE:  01/16/2014 DATE OF DISCHARGE:                              OPERATIVE REPORT   PREOPERATIVE DIAGNOSIS:  Bilateral stage II diabetic foot ulcers on the plantar forefoot of both feet.  POSTOPERATIVE DIAGNOSIS:  Bilateral stage II diabetic foot ulcers on the plantar forefoot of both feet.  PROCEDURE:  Debridement of bilateral foot ulcers including removal of skin, soft tissue, and fascia.  FINDINGS:  Bilateral foot plantar ulcers measuring 2-3 cm in diameter and 2-3 mm in depth on bilateral feet with necrosis but no gross purulence.  SURGEON:  Doneen Poissonhristopher Jerome Otter, MD.  ASSISTING:  Richardean CanalGilbert Clark, P.A.C.  ANESTHESIA:  General.  BLOOD LOSS:  Less than 50 mL.  COMPLICATIONS:  None.  INDICATIONS:  Dwayne [name] is a 34 year old gentleman with poorly controlled diabetes.  He has had a foot wound on 1 foot since July of last year, and another 1 on the plantar aspect of his other foot since October.  He has been in prison at one point had debridement at the bedside but some type of medical personnel in the prison system.  He has not been compliant with his general diabetic and foot care, and general health overall.  He was admitted to the hospital 2 days ago due to worsening foot ulcers.  Talked to him in length about the need and debride these down and probably about foot care as well.  PROCEDURE DESCRIPTION:  Informed consent was obtained, and both feet were marked.  He was brought to the operating room, placed supine on the operating table.  General anesthesia was obtained.  We then prepped his bilateral feet with Betadine scrub and paint.  Time-out was called to identify correct patient, correct bilateral feet.  We then used a #10 blade  on both feet sparing the callused tissue down to a smooth level on both feet into the deepest part of the wound.  There was no exposed bone.  No gross purulence at all and just necrotic tissue on both. These once we were able to get down to good bleeding tissue and flattened surface, we placed Xeroform followed by well padded sterile dressing.  He was awakened, extubated, taken to the recovery room in stable condition.  All final counts were correct. There were no complications noted.  Postoperatively, he will need bilateral postop shoes and aggressive diabetic management and wound care to get these to heal slowly over time.     Dwayne [name], M.D.     CYB/MEDQ  D:  01/16/2014  T:  01/16/2014  Job:  [id number]

## 2014-01-16 NOTE — Transfer of Care (Signed)
Immediate Anesthesia Transfer of Care Note  Patient: Dwayne HickmanBenjamin Dyckman  Procedure(s) Performed: Procedure(s):  DEBRIDEMENT of bilateral foot ulcers (Bilateral)  Patient Location: PACU  Anesthesia Type:General  Level of Consciousness: awake and alert   Airway & Oxygen Therapy: Patient Spontanous Breathing and Patient connected to nasal cannula oxygen  Post-op Assessment: Report given to PACU RN, Post -op Vital signs reviewed and stable and Patient moving all extremities X 4  Post vital signs: Reviewed and stable  Complications: No apparent anesthesia complications

## 2014-01-16 NOTE — Preoperative (Signed)
Beta Blockers   Reason not to administer Beta Blockers:Not Applicable 

## 2014-01-16 NOTE — Anesthesia Preprocedure Evaluation (Addendum)
Anesthesia Evaluation  Patient identified by MRN, date of birth, ID band Patient awake    Reviewed: Allergy & Precautions, H&P , NPO status , Patient's Chart, lab work & pertinent test results  Airway Mallampati: II TM Distance: >3 FB Neck ROM: Full    Dental  (+) Dental Advisory Given, Poor Dentition and Missing   Pulmonary Current Smoker,  breath sounds clear to auscultation        Cardiovascular hypertension, Rhythm:Regular Rate:Normal     Neuro/Psych    GI/Hepatic   Endo/Other  diabetes, Oral Hypoglycemic Agents  Renal/GU      Musculoskeletal   Abdominal   Peds  Hematology   Anesthesia Other Findings   Reproductive/Obstetrics                         Anesthesia Physical Anesthesia Plan  ASA: II  Anesthesia Plan: General   Post-op Pain Management:    Induction: Intravenous  Airway Management Planned: Oral ETT and LMA  Additional Equipment:   Intra-op Plan:   Post-operative Plan: Extubation in OR  Informed Consent: I have reviewed the patients History and Physical, chart, labs and discussed the procedure including the risks, benefits and alternatives for the proposed anesthesia with the patient or authorized representative who has indicated his/her understanding and acceptance.   Dental advisory given  Plan Discussed with: CRNA, Surgeon and Anesthesiologist  Anesthesia Plan Comments:         Anesthesia Quick Evaluation

## 2014-01-16 NOTE — Progress Notes (Signed)
Orthopedic Tech Progress Note Patient Details:  Dwayne HickmanBenjamin [name] 12/05/1980 [id number]  Ortho Devices Type of Ortho Device: Postop shoe/boot Ortho Device/Splint Location: bilayeral Ortho Device/Splint Interventions: Application   Ayahna Solazzo 01/16/2014, 4:54 PM

## 2014-01-16 NOTE — Progress Notes (Signed)
Patient ID: Dwayne [name]  male  NWG:[id number]    DOB: 11-20-1980    DOA: 01/15/2014  PCP: No PCP Per Patient  Assessment/Plan:    Diabetic foot infection bilateral - Foot x-rays reviewed, both feet has ulcers on the plantar forefoot with foul-smelling discharge - MRI of the right and left foot pending results - Continue vancomycin and Zosyn - Appreciate orthopedics, debridement today    Tobacco abuse - Patient counseled against smoking    Diabetes mellitus: Uncontrolled, hemoglobin A1c 14.1 - Sugars still running uncontrolled in 300's - Increase Lantus to 20 units, sliding scale insulin, meal coverage to 6units TID  Medical noncompliance - Per patient and his wife, he has been following with the ER for his regular diabetic care, does not have any PCP, wound care. Patient reports that he does not take any medication except metformin prescription was given to him by the ER. The patient appears to have no interest in conversation or educating him about his diabetes throughout the encounter  DVT Prophylaxis:  Code Status: Full code  Family Communication: Discussed with patient and his wife at the bedside  [facility] to 10days - 2weeks post hospitalization, will d/w CM on Monday    Subjective: Patient seen and examined, dressing intact, denies any specific complaints  Objective: Weight change: 2.313 kg (5 lb 1.6 oz) No intake or output data in the 24 hours ending 01/16/14 1133 Blood pressure 114/37, pulse 76, temperature 98.6 F (37 C), temperature source Oral, resp. rate 18, height 6\' 2"  (1.88 m), weight 100.744 kg (222 lb 1.6 oz), SpO2 99.00%.  Physical Exam: General: Sleepy, not interested in talking CVS: S1-S2 clear, no murmur rubs or gallops Chest: CTAB Abdomen: soft NT, ND, NBS  Extremities dressing intact on both feet   Lab Results: Basic Metabolic Panel:  Recent Labs Lab 01/15/14 0205  01/16/14 0503  NA 138 139  K 3.7 3.8  CL 100 106  CO2 24 24  GLUCOSE 267* 358*  BUN 14 16  CREATININE 1.13 1.30  CALCIUM 9.3 8.3*   Liver Function Tests: No results found for this basename: AST, ALT, ALKPHOS, BILITOT, PROT, ALBUMIN,  in the last 168 hours No results found for this basename: LIPASE, AMYLASE,  in the last 168 hours No results found for this basename: AMMONIA,  in the last 168 hours CBC:  Recent Labs Lab 01/15/14 0205 01/16/14 0503  WBC 7.8 5.2  NEUTROABS 4.7  --   HGB 13.4 11.0*  HCT 37.8* 31.3*  MCV 82.7 83.0  PLT 204 159   Cardiac Enzymes: No results found for this basename: CKTOTAL, CKMB, CKMBINDEX, TROPONINI,  in the last 168 hours BNP: No components found with this basename: POCBNP,  CBG:  Recent Labs Lab 01/15/14 0749 01/15/14 1218 01/15/14 1655 01/15/14 2117 01/16/14 0803  GLUCAP 399* 217* 261* 209* 340*     Micro Results: No results found for this or any previous visit (from the past 240 hour(s)).  Studies/Results: Dg Foot Complete Left  01/15/2014   CLINICAL DATA:  Foot ulcer in history of diabetes  EXAM: LEFT FOOT - COMPLETE 3+ VIEW  COMPARISON:  12/01/2013  FINDINGS: There is an ulcer at the level of the third metatarsal head. No evidence of subcutaneous emphysema. Radiodense foreign body or osseous infection. No acute fracture. Chronic marginal erosion at the first interphalangeal joint.  IMPRESSION: Ulcer of the plantar forefoot. No evidence of foreign body or osseous infection.   Electronically  Signed   By: Tiburcio PeaJonathan  Watts M.D.   On: 01/15/2014 02:55   Dg Foot Complete Right  01/15/2014   CLINICAL DATA:  Diabetes.  Foot ulcer.  EXAM: RIGHT FOOT COMPLETE - 3+ VIEW  COMPARISON:  12/01/2013  FINDINGS: There is an enlarging ulcer at the level of the plantar third metatarsal head. There is no evidence of radiodense foreign body or spreading subcutaneous emphysema. No joint narrowing or osseous erosion. No acute fracture. Chronic marginal  erosion at the great toe interphalangeal joint.  IMPRESSION: Plantar forefoot ulcer without evidence of foreign body or osseous infection   Electronically Signed   By: Tiburcio PeaJonathan  Watts M.D.   On: 01/15/2014 02:59    Medications: Scheduled Meds: . insulin aspart  0-15 Units Subcutaneous TID WC  . insulin aspart  4 Units Subcutaneous TID WC  . insulin glargine  15 Units Subcutaneous QHS  . piperacillin-tazobactam (ZOSYN)  IV  3.375 g Intravenous Q8H  . vancomycin  1,000 mg Intravenous Q8H      LOS: 1 day   Ercel Pepitone M.D. [facility] 01/16/2014, 11:33 AM Pager: [phone]  If 7PM-7AM, please contact night-coverage [email or web address] Password [email or web address]

## 2014-01-16 NOTE — Anesthesia Procedure Notes (Signed)
Procedure Name: LMA Insertion Date/Time: 01/16/2014 1:31 PM Performed by: Vita BarleyURNER, Drishti Pepperman E Pre-anesthesia Checklist: Patient identified, Emergency Drugs available, Suction available and Patient being monitored Patient Re-evaluated:Patient Re-evaluated prior to inductionOxygen Delivery Method: Circle system utilized Preoxygenation: Pre-oxygenation with 100% oxygen Intubation Type: IV induction Ventilation: Mask ventilation without difficulty and Oral airway inserted - appropriate to patient size LMA: LMA inserted LMA Size: 5.0 Number of attempts: 1 Airway Equipment and Method: Oral airway Placement Confirmation: positive ETCO2 and breath sounds checked- equal and bilateral Tube secured with: Tape Dental Injury: Teeth and Oropharynx as per pre-operative assessment

## 2014-01-16 NOTE — Anesthesia Postprocedure Evaluation (Signed)
  Anesthesia Post-op Note  Patient: Dwayne HickmanBenjamin [name]  Procedure(s) Performed: Procedure(s):  DEBRIDEMENT of bilateral foot ulcers (Bilateral)  Patient Location: PACU  Anesthesia Type:General  Level of Consciousness: awake, alert  and oriented  Airway and Oxygen Therapy: Patient Spontanous Breathing  Post-op Pain: mild  Post-op Assessment: Post-op Vital signs reviewed, Patient's Cardiovascular Status Stable, Respiratory Function Stable, Patent Airway and Pain level controlled  Post-op Vital Signs: stable  Complications: No apparent anesthesia complications

## 2014-01-17 LAB — GLUCOSE, CAPILLARY
GLUCOSE-CAPILLARY: 151 mg/dL — AB (ref 70–99)
GLUCOSE-CAPILLARY: 94 mg/dL (ref 70–99)
GLUCOSE-CAPILLARY: 179 mg/dL — AB (ref 70–99)
Glucose-Capillary: 205 mg/dL — ABNORMAL HIGH (ref 70–99)
Glucose-Capillary: 246 mg/dL — ABNORMAL HIGH (ref 70–99)

## 2014-01-17 LAB — CBC
HCT: 32 % — ABNORMAL LOW (ref 39.0–52.0)
Hemoglobin: 11.6 g/dL — ABNORMAL LOW (ref 13.0–17.0)
MCH: 29.4 pg (ref 26.0–34.0)
MCHC: 36.3 g/dL — AB (ref 30.0–36.0)
MCV: 81.2 fL (ref 78.0–100.0)
PLATELETS: 162 10*3/uL (ref 150–400)
RBC: 3.94 MIL/uL — ABNORMAL LOW (ref 4.22–5.81)
RDW: 12.5 % (ref 11.5–15.5)
WBC: 6.8 10*3/uL (ref 4.0–10.5)

## 2014-01-17 LAB — BASIC METABOLIC PANEL
BUN: 14 mg/dL (ref 6–23)
CALCIUM: 8.6 mg/dL (ref 8.4–10.5)
CHLORIDE: 104 meq/L (ref 96–112)
CO2: 21 mEq/L (ref 19–32)
CREATININE: 1.1 mg/dL (ref 0.50–1.35)
GFR, EST NON AFRICAN AMERICAN: 87 mL/min — AB (ref >90)
Glucose, Bld: 229 mg/dL — ABNORMAL HIGH (ref 70–99)
Potassium: 3.5 mEq/L — ABNORMAL LOW (ref 3.7–5.3)
Sodium: 138 mEq/L (ref 137–147)

## 2014-01-17 MED ORDER — INSULIN GLARGINE 100 UNIT/ML ~~LOC~~ SOLN
25.0000 [IU] | Freq: Every day | SUBCUTANEOUS | Status: DC
  Administered 2014-01-17: 25 [IU] via SUBCUTANEOUS
  Filled 2014-01-17 (×2): qty 0.25

## 2014-01-17 MED ORDER — INSULIN ASPART 100 UNIT/ML ~~LOC~~ SOLN
8.0000 [IU] | Freq: Three times a day (TID) | SUBCUTANEOUS | Status: DC
  Administered 2014-01-17 – 2014-01-18 (×3): 8 [IU] via SUBCUTANEOUS

## 2014-01-17 MED ORDER — IBUPROFEN 800 MG PO TABS
800.0000 mg | ORAL_TABLET | Freq: Once | ORAL | Status: AC
  Administered 2014-01-17: 800 mg via ORAL
  Filled 2014-01-17: qty 1

## 2014-01-17 MED ORDER — INSULIN ASPART 100 UNIT/ML ~~LOC~~ SOLN: 7.0000 [IU] | Freq: Three times a day (TID) | SUBCUTANEOUS | Status: DC

## 2014-01-17 NOTE — Progress Notes (Signed)
Pts temp 101.1 orally with pm vitals. Tana Coast. Reidler, NP notified and orders placed. Pt already on IV antibiotics and had received tylenol earlier for pain and not time to receive again. Will cont to monitor.

## 2014-01-17 NOTE — Progress Notes (Signed)
Patient ID: Dwayne HickmanBenjamin Mossberg, male   DOB: 02/13/1980, 34 y.o.   MRN: [id number] No acute changes.  We were able to debride his bilateral forefoot plantar ulcers back to good bleeding tissue.  He will need to soak his feet daily in soapy water followed by placing a new dry dressing.  Can follow-up in my office in one week.

## 2014-01-17 NOTE — Discharge Instructions (Signed)
Soak your feet in soapy water for 15-20 minutes daily followed by placing neosporin ointment daily and new dry dressing.

## 2014-01-17 NOTE — Progress Notes (Signed)
Patient ID: Dwayne [name]  male  ZOX:[id number]    DOB: 1980/12/17    DOA: 01/15/2014  PCP: No PCP Per Patient  Assessment/Plan:    Diabetic foot infection bilateral, diabetic foot ulcers, stage II status post debridement - Foot x-rays reviewed, both feet has ulcers on the plantar forefoot with foul-smelling discharge - MRI of the right and left foot showed cellulitis with myositis but no evidence of osteomyelitis or abscess in both feet - Continue vancomycin and Zosyn, will change to doxy and Cipro for a month at DC - Appreciate orthopedics, status post debridement on 01/16/2014    Tobacco abuse - Patient counseled against smoking    Diabetes mellitus: Uncontrolled, hemoglobin A1c 14.1 - Sugars still running uncontrolled in 300's - Increase Lantus to 25 units, sliding scale insulin, meal coverage to 8units TID  Medical noncompliance - Per patient and his wife, he has been following with the ER for his regular diabetic care, does not have any PCP, wound care. - He is going to need proper wound care to his feet, antibiotics, insulin due to uncontrolled diabetes with hemoglobin A1c of 14.1. I am very concerned about his compliance, he's not interested to listening to any of the above.  DVT Prophylaxis:  Code Status: Full code  Family Communication: Discussed with patient and his wife at the bedside  [facility] to 10days - 2weeks post hospitalization, will d/w CM on Monday    Subjective: Patient seen and examined, dressing intact on the feet, blood sugars still uncontrolled Objective: Weight change:   Intake/Output Summary (Last 24 hours) at 01/17/14 1218 Last data filed at 01/17/14 0900  Gross per 24 hour  Intake   7465 ml  Output      0 ml  Net   7465 ml   Blood pressure 156/95, pulse 91, temperature 98.8 F (37.1 C), temperature source Oral, resp. rate 18, height 6\' 2"  (1.88 m), weight 100.744 kg (222 lb 1.6  oz), SpO2 100.00%.  Physical Exam: General: Alert and awake, oriented, NAD CVS: S1-S2 clear Chest: CTAB Abdomen: soft NT, ND, NBS  Extremities dressing intact on both feet   Lab Results: Basic Metabolic Panel:  Recent Labs Lab 01/16/14 0503 01/17/14 0500  NA 139 138  K 3.8 3.5*  CL 106 104  CO2 24 21  GLUCOSE 358* 229*  BUN 16 14  CREATININE 1.30 1.10  CALCIUM 8.3* 8.6   Liver Function Tests: No results found for this basename: AST, ALT, ALKPHOS, BILITOT, PROT, ALBUMIN,  in the last 168 hours No results found for this basename: LIPASE, AMYLASE,  in the last 168 hours No results found for this basename: AMMONIA,  in the last 168 hours CBC:  Recent Labs Lab 01/15/14 0205 01/16/14 0503 01/17/14 0500  WBC 7.8 5.2 6.8  NEUTROABS 4.7  --   --   HGB 13.4 11.0* 11.6*  HCT 37.8* 31.3* 32.0*  MCV 82.7 83.0 81.2  PLT 204 159 162   Cardiac Enzymes: No results found for this basename: CKTOTAL, CKMB, CKMBINDEX, TROPONINI,  in the last 168 hours BNP: No components found with this basename: POCBNP,  CBG:  Recent Labs Lab 01/16/14 1522 01/16/14 1704 01/16/14 2116 01/17/14 0743 01/17/14 1208  GLUCAP 180* 253* 260* 205* 246*     Micro Results: Recent Results (from the past 240 hour(s))  CULTURE, BLOOD (ROUTINE X 2)     Status: None   Collection Time    01/15/14  1:58  AM      Result Value Range Status   Specimen Description BLOOD LEFT HAND   Final   Special Requests BOTTLES DRAWN AEROBIC ONLY 10CC   Final   Culture  Setup Time     Final   Value: 01/15/2014 08:12     Performed at [facility]   Culture     Final   Value:        BLOOD CULTURE RECEIVED NO GROWTH TO DATE CULTURE WILL BE HELD FOR 5 DAYS BEFORE ISSUING A FINAL NEGATIVE REPORT     Performed at [facility]   Report Status PENDING   Incomplete  CULTURE, BLOOD (ROUTINE X 2)     Status: None   Collection Time    01/15/14  2:05 AM      Result Value Range Status   Specimen Description  BLOOD RIGHT ARM   Final   Special Requests     Final   Value: BOTTLES DRAWN AEROBIC AND ANAEROBIC 10CC AEROBIC 5CC ANAEROBIC   Culture  Setup Time     Final   Value: 01/15/2014 08:12     Performed at [facility]   Culture     Final   Value:        BLOOD CULTURE RECEIVED NO GROWTH TO DATE CULTURE WILL BE HELD FOR 5 DAYS BEFORE ISSUING A FINAL NEGATIVE REPORT     Performed at [facility]   Report Status PENDING   Incomplete    Studies/Results: Dg Foot Complete Left  01/15/2014   CLINICAL DATA:  Foot ulcer in history of diabetes  EXAM: LEFT FOOT - COMPLETE 3+ VIEW  COMPARISON:  12/01/2013  FINDINGS: There is an ulcer at the level of the third metatarsal head. No evidence of subcutaneous emphysema. Radiodense foreign body or osseous infection. No acute fracture. Chronic marginal erosion at the first interphalangeal joint.  IMPRESSION: Ulcer of the plantar forefoot. No evidence of foreign body or osseous infection.   Electronically Signed   By: Tiburcio PeaJonathan  Watts M.D.   On: 01/15/2014 02:55   Dg Foot Complete Right  01/15/2014   CLINICAL DATA:  Diabetes.  Foot ulcer.  EXAM: RIGHT FOOT COMPLETE - 3+ VIEW  COMPARISON:  12/01/2013  FINDINGS: There is an enlarging ulcer at the level of the plantar third metatarsal head. There is no evidence of radiodense foreign body or spreading subcutaneous emphysema. No joint narrowing or osseous erosion. No acute fracture. Chronic marginal erosion at the great toe interphalangeal joint.  IMPRESSION: Plantar forefoot ulcer without evidence of foreign body or osseous infection   Electronically Signed   By: Tiburcio PeaJonathan  Watts M.D.   On: 01/15/2014 02:59    Medications: Scheduled Meds: . insulin aspart  0-15 Units Subcutaneous TID WC  . insulin aspart  6 Units Subcutaneous TID WC  . insulin glargine  20 Units Subcutaneous QHS  . piperacillin-tazobactam (ZOSYN)  IV  3.375 g Intravenous Q8H  . vancomycin  1,000 mg Intravenous Q8H      LOS: 2 days    RAI,RIPUDEEP M.D. [facility] 01/17/2014, 12:18 PM Pager: [phone]  If 7PM-7AM, please contact night-coverage [email or web address] Password [email or web address]

## 2014-01-18 ENCOUNTER — Ambulatory Visit: Payer: Managed Care, Other (non HMO)

## 2014-01-18 DIAGNOSIS — I10 Essential (primary) hypertension: Secondary | ICD-10-CM

## 2014-01-18 DIAGNOSIS — Z9119 Patient's noncompliance with other medical treatment and regimen: Secondary | ICD-10-CM

## 2014-01-18 DIAGNOSIS — Z91199 Patient's noncompliance with other medical treatment and regimen due to unspecified reason: Secondary | ICD-10-CM

## 2014-01-18 HISTORY — DX: Patient's noncompliance with other medical treatment and regimen: Z91.19

## 2014-01-18 HISTORY — DX: Patient's noncompliance with other medical treatment and regimen due to unspecified reason: Z91.199

## 2014-01-18 HISTORY — DX: Essential (primary) hypertension: I10

## 2014-01-18 LAB — CBC
HCT: 32.1 % — ABNORMAL LOW (ref 39.0–52.0)
Hemoglobin: 11.5 g/dL — ABNORMAL LOW (ref 13.0–17.0)
MCH: 29 pg (ref 26.0–34.0)
MCHC: 35.8 g/dL (ref 30.0–36.0)
MCV: 80.9 fL (ref 78.0–100.0)
Platelets: 166 10*3/uL (ref 150–400)
RBC: 3.97 MIL/uL — AB (ref 4.22–5.81)
RDW: 12.2 % (ref 11.5–15.5)
WBC: 8.1 10*3/uL (ref 4.0–10.5)

## 2014-01-18 LAB — GLUCOSE, CAPILLARY
GLUCOSE-CAPILLARY: 153 mg/dL — AB (ref 70–99)
GLUCOSE-CAPILLARY: 165 mg/dL — AB (ref 70–99)
GLUCOSE-CAPILLARY: 95 mg/dL (ref 70–99)
Glucose-Capillary: 100 mg/dL — ABNORMAL HIGH (ref 70–99)

## 2014-01-18 LAB — BASIC METABOLIC PANEL
BUN: 10 mg/dL (ref 6–23)
CO2: 23 meq/L (ref 19–32)
CREATININE: 1.16 mg/dL (ref 0.50–1.35)
Calcium: 8.6 mg/dL (ref 8.4–10.5)
Chloride: 105 mEq/L (ref 96–112)
GFR calc non Af Amer: 81 mL/min — ABNORMAL LOW (ref >90)
Glucose, Bld: 185 mg/dL — ABNORMAL HIGH (ref 70–99)
POTASSIUM: 3.2 meq/L — AB (ref 3.7–5.3)
SODIUM: 141 meq/L (ref 137–147)

## 2014-01-18 MED ORDER — LISINOPRIL 10 MG PO TABS
10.0000 mg | ORAL_TABLET | Freq: Every day | ORAL | Status: DC
  Administered 2014-01-18 – 2014-01-19 (×2): 10 mg via ORAL
  Filled 2014-01-18 (×2): qty 1

## 2014-01-18 MED ORDER — ENOXAPARIN SODIUM 40 MG/0.4ML ~~LOC~~ SOLN
40.0000 mg | SUBCUTANEOUS | Status: DC
  Administered 2014-01-18: 40 mg via SUBCUTANEOUS
  Filled 2014-01-18 (×2): qty 0.4

## 2014-01-18 MED ORDER — POTASSIUM CHLORIDE CRYS ER 20 MEQ PO TBCR
40.0000 meq | EXTENDED_RELEASE_TABLET | Freq: Once | ORAL | Status: AC
  Administered 2014-01-18: 40 meq via ORAL
  Filled 2014-01-18: qty 2

## 2014-01-18 MED ORDER — INSULIN ASPART PROT & ASPART (70-30 MIX) 100 UNIT/ML ~~LOC~~ SUSP
20.0000 [IU] | Freq: Two times a day (BID) | SUBCUTANEOUS | Status: DC
  Administered 2014-01-18 – 2014-01-19 (×2): 20 [IU] via SUBCUTANEOUS
  Filled 2014-01-18: qty 10

## 2014-01-18 NOTE — Progress Notes (Signed)
ANTIBIOTIC CONSULT NOTE - FOLLOW UP  Pharmacy Consult for Vancomycin Indication: diabetic foot ulcer  No Known Allergies  Patient Measurements: Height: 6\' 2"  (188 cm) Weight: 222 lb 1.6 oz (100.744 kg) IBW/kg (Calculated) : 82.2  Vital Signs: Temp: 100 F (37.8 C) (01/19 0614) Temp src: Oral (01/19 0614) BP: 166/87 mmHg (01/19 0614) Pulse Rate: 106 (01/19 0614) Intake/Output from previous day: 01/18 0701 - 01/19 0700 In: 4380 [P.O.:1200; I.V.:2430; IV Piggyback:750] Out: -  Intake/Output from this shift:    Labs:  Recent Labs  01/16/14 0503 01/17/14 0500 01/18/14 0501  WBC 5.2 6.8 8.1  HGB 11.0* 11.6* 11.5*  PLT 159 162 166  CREATININE 1.30 1.10 1.16   Estimated Creatinine Clearance: 114.8 ml/min (by C-G formula based on Cr of 1.16).  Assessment: 33yom continues on day #4 vancomycin for diabetic foot ulcers. MRI negative for osteo. S/P I&D on 1/17.  Renal function has remained pretty stable.  Goal of Therapy:  Vancomycin trough level 10-15 mcg/ml since no osteo  Plan:  1) Continue vancomycin 1g IV q8 2) Hold off on trough since planning to d/c soon on cipro and doxycycline.  Fredrik RiggerMarkle, Avan Gullett Sue 01/18/2014,2:00 PM

## 2014-01-18 NOTE — Progress Notes (Signed)
Clinical Social Work Department BRIEF PSYCHOSOCIAL ASSESSMENT 01/18/2014  Patient:  Dwayne [name],Dwayne [name]     Account Number:  [id number]     Admit date:  01/14/2014  Clinical Social Worker:  Dwayne NakayamaAMBELAL,Dwayne [name], LCSWA  Date/Time:  01/18/2014 01:30 PM  Referred by:  RN  Date Referred:  01/18/2014 Referred for  Other - See comment   Other Referral:   Court appearance   Interview type:  Patient Other interview type:    PSYCHOSOCIAL DATA Living Status:  WIFE Admitted from facility:   Level of care:   Primary support name:  Dwayne Collariffany [name] [phone] Primary support relationship to patient:  SPOUSE Degree of support available:   Pt has supportive wife    CURRENT CONCERNS Current Concerns  Other - See comment   Other Concerns:   Pt has court appearance scheduled for tomorrow    SOCIAL WORK ASSESSMENT / PLAN CSW informed that pt has a court appearance tomorrow and he will likely not be discharged in time to make this hearing. CSW spoke with pt and pt wife who informed CSW he has two court appearances for tomorrow. Pt reported that one hearing is for child support. The pt said he could not remember exactly what the second hearing was for and that "the warrant and a lot listed". CSW informed pt that per RN plan is for pt to be discharged tomorrow. Pt stated his court appearances are scheduled for tomorrow morning. CSW informed pt that he may not be discharged in time for the hearings and CSW could provide letter stating that pt was here at the hospital with specific date of admission and planned dc date. CSW did inform pt this letter would be no guarantee that pt would not be in contempt of court for not appearing at scheduled time. Pt understanding and thanked CSW. CSW asked if pt would like CSW to fax letter or if pt would like a copy to send on his own. Pt wife requested for both to take place. CSW informed pt he or his wife would need to call and get specific fax number and ATTN  information. Pt and pt wife agreed. CSW to meet with pt in one hour to get this information.   Assessment/plan status:  No Further Intervention Required Other assessment/ plan:   Information/referral to community resources:   Letter for court with pt admission and planned dc date    PATIENT'S/FAMILY'S RESPONSE TO PLAN OF CARE: Pt was thankful for CSW and wanting CSW to fax letter to court and give pt a copy.       Dwayne [name], LCSWA [phone]

## 2014-01-18 NOTE — Progress Notes (Signed)
Inpatient Diabetes Program Recommendations  AACE/ADA: New Consensus Statement on Inpatient Glycemic Control (2013)  Target Ranges:  Prepandial:   less than 140 mg/dL      Peak postprandial:   less than 180 mg/dL (1-2 hours)      Critically ill patients:  140 - 180 mg/dL     Results for Dwayne [name], Dwayne [name] (MRN [id number]) as of 01/18/2014 11:07  Ref. Range 01/17/2014 07:43 01/17/2014 12:08 01/17/2014 16:28 01/17/2014 21:27  Glucose-Capillary Latest Range: 70-99 mg/dL 119205 (H) 147246 (H) 829151 (H) 94    Results for Dwayne [name], Dwayne [name] (MRN [id number]) as of 01/18/2014 11:07  Ref. Range 01/18/2014 07:33  Glucose-Capillary Latest Range: 70-99 mg/dL 784153 (H)    Results for Dwayne [name], Dwayne [name] (MRN [id number]) as of 01/18/2014 11:07  Ref. Range 01/15/2014 05:30  Hemoglobin A1C Latest Range: <5.7 % 14.1 (H)     **Patient without health insurance coverage- No PCP.  [facility].  [facility] orange card application assistance.  **Spoke with both Steward DroneBrenda (care Production designer, theatre/television/filmmanager) and Dr. Isidoro Donningai this morning about converting this patient to 70/30 insulin for d/c.  Patient currently receiving Lantus 25 units QHS, Moderate SSI, and Novolog 8 units tid with meals (meal coverage).  **Based on patient's current CBGs and insulin requirements, recommend conversion to 70/30 insulin- 20 units bid with meals to start this evening.  **Orders entered into EPIC per Dr. Fransico Himai's request.   Ambrose FinlandJeannine Johnston Alexismarie Flaim RN, MSN, CDE Diabetes Coordinator Inpatient Diabetes Program Team Pager: [phone] (8a-10p)

## 2014-01-18 NOTE — Progress Notes (Signed)
Patient ID: Dwayne [name]  male  ONG:[id number]    DOB: [birth year]    DOA: 01/15/2014  PCP: No PCP Per Patient  Assessment/Plan:   Diabetic foot infection bilateral, diabetic foot ulcers, stage II status post debridement, now spiking fevers  - Foot x-rays reviewed, both feet has ulcers on the plantar forefoot with foul-smelling discharge  - MRI of the right and left foot showed cellulitis with myositis but no evidence of osteomyelitis or abscess in both feet  - Continue vancomycin and Zosyn, will change to doxy and Cipro at DC  - Appreciate orthopedics, status post debridement on 01/16/2014  - Obtain 2 sets of blood cultures, appears to be no cultures from the recent debridement in the epic  Tobacco abuse  - Patient counseled against smoking   Diabetes mellitus: Uncontrolled, hemoglobin A1c 14.1  - CBG's improving, I am concerned that patient will not be compliant with his medications/ insulin, eating unapproved food (pizza) during hospitalization. Discussed with diabetic coordinator, will place on NPH insulin for cost and less insulin/shots requirement.   Medical noncompliance  - Per patient and his wife, he has been following with the ER for his regular diabetic care, does not have any PCP, wound care.  - He is going to need proper wound care to his feet, antibiotics, insulin due to uncontrolled diabetes with hemoglobin A1c of 14.1.   Hypertension - Started on lisinopril 10 mg daily  Hypokalemia: Replaced  DVT Prophylaxis:  Code Status:  Family Communication:  Disposition:    Subjective: Has started spiking fevers since overnight, denies any worsening pain or nausea, vomiting, coughing   Objective: Weight change:   Intake/Output Summary (Last 24 hours) at 01/18/14 1449 Last data filed at 01/18/14 0618  Gross per 24 hour  Intake   3610 ml  Output      0 ml  Net   3610 ml   Blood pressure 159/90, pulse 98, temperature 101.2 F (38.4 C), temperature source Oral,  resp. rate 16, height 6\' 2"  (1.88 m), weight 100.744 kg (222 lb 1.6 oz), SpO2 100.00%.  Physical Exam: General: Alert and awake, oriented x3, NAD CVS: S1-S2 clear, no murmur rubs or gallops Chest: clear to auscultation bilaterally, no wheezing, rales or rhonchi Abdomen: soft nontender, nondistended, normal bowel sounds  Extremities:Bilateral feet dressing intact    Lab Results: Basic Metabolic Panel:  Recent Labs Lab 01/17/14 0500 01/18/14 0501  NA 138 141  K 3.5* 3.2*  CL 104 105  CO2 21 23  GLUCOSE 229* 185*  BUN 14 10  CREATININE 1.10 1.16  CALCIUM 8.6 8.6   Liver Function Tests: No results found for this basename: AST, ALT, ALKPHOS, BILITOT, PROT, ALBUMIN,  in the last 168 hours No results found for this basename: LIPASE, AMYLASE,  in the last 168 hours No results found for this basename: AMMONIA,  in the last 168 hours CBC:  Recent Labs Lab 01/15/14 0205  01/17/14 0500 01/18/14 0501  WBC 7.8  < > 6.8 8.1  NEUTROABS 4.7  --   --   --   HGB 13.4  < > 11.6* 11.5*  HCT 37.8*  < > 32.0* 32.1*  MCV 82.7  < > 81.2 80.9  PLT 204  < > 162 166  < > = values in this interval not displayed. Cardiac Enzymes: No results found for this basename: CKTOTAL, CKMB, CKMBINDEX, TROPONINI,  in the last 168 hours BNP: No components found with this basename: POCBNP,  CBG:  Recent Labs Lab  01/17/14 1628 01/17/14 2127 01/17/14 2245 01/18/14 0733 01/18/14 1145  GLUCAP 151* 94 179* 153* 165*     Micro Results: Recent Results (from the past 240 hour(s))  CULTURE, BLOOD (ROUTINE X 2)     Status: None   Collection Time    01/15/14  1:58 AM      Result Value Range Status   Specimen Description BLOOD LEFT HAND   Final   Special Requests BOTTLES DRAWN AEROBIC ONLY 10CC   Final   Culture  Setup Time     Final   Value: 01/15/2014 08:12     [facility]   Culture     Final   Value:        BLOOD CULTURE RECEIVED NO GROWTH TO DATE CULTURE WILL BE HELD FOR 5  DAYS BEFORE ISSUING A FINAL NEGATIVE REPORT     Performed at [facility]   Report Status PENDING   Incomplete  CULTURE, BLOOD (ROUTINE X 2)     Status: None   Collection Time    01/15/14  2:05 AM      Result Value Range Status   Specimen Description BLOOD RIGHT ARM   Final   Special Requests     Final   Value: BOTTLES DRAWN AEROBIC AND ANAEROBIC 10CC AEROBIC 5CC ANAEROBIC   Culture  Setup Time     Final   Value: 01/15/2014 08:12     Performed at [facility]   Culture     Final   Value:        BLOOD CULTURE RECEIVED NO GROWTH TO DATE CULTURE WILL BE HELD FOR 5 DAYS BEFORE ISSUING A FINAL NEGATIVE REPORT     Performed at [facility]   Report Status PENDING   Incomplete    Studies/Results: Mr Foot Right W Wo Contrast  01/16/2014   CLINICAL DATA:  Bilateral diabetic foot ulcers.  EXAM: MRI OF THE RIGHT FOREFOOT WITHOUT AND WITH CONTRAST  TECHNIQUE: Multiplanar, multisequence MR imaging was performed both before and after administration of intravenous contrast.  CONTRAST:  20 ml MultiHance.  COMPARISON:  Radiographs 01/15/2014.  FINDINGS: As demonstrated radiographically, there is plantar forefoot soft tissue ulceration centered at the third metatarsal head. No focal underlying fluid collection is demonstrated. There is diffuse edema and enhancement throughout the forefoot subcutaneous fat and muscles consistent with cellulitis and myositis. No focal fluid collections are identified.  There is no significant tenosynovitis. There are no significant joint effusions or abnormal synovial enhancement. There is no evidence of osteomyelitis. Mild degenerative changes at the first and second metatarsal phalangeal joints are noted.  IMPRESSION: 1. Plantar forefoot soft tissue ulceration. 2. Generalized forefoot cellulitis and myositis.  No focal abscess. 3. No evidence of osteomyelitis.   Electronically Signed   By: Roxy HorsemanBill  Veazey M.D.   On: 01/16/2014 13:29   Mr Foot Left W Wo  Contrast  01/16/2014   CLINICAL DATA:  Diabetic with bilateral foot ulceration.  EXAM: MRI OF THE LEFT FOREFOOT WITHOUT AND WITH CONTRAST  TECHNIQUE: Multiplanar, multisequence MR imaging was performed both before and after administration of intravenous contrast.  CONTRAST:  20mL MULTIHANCE GADOBENATE DIMEGLUMINE 529 MG/ML IV SOLN  COMPARISON:  Radiographs 01/15/2014.  FINDINGS: This examination is mildly motion degraded, especially on the post-contrast images. As demonstrated radiographically, there is a large area of skin ulceration over the plantar aspect of the forefoot, centered at the second and third metatarsal heads. No underlying focal fluid collection is demonstrated.  There is some ill-defined T2 hyperintensity tracking along the flexor tendons. There is mild dorsal subcutaneous edema and mild generalized muscular enhancement.  Mild degenerative changes are present at the metatarsal phalangeal and interphalangeal joints of the great toe. There is no significant joint effusion. There is no evidence of osteomyelitis.  IMPRESSION: 1. Plantar forefoot soft tissue ulceration. 2. Mild cellulitis and myositis.  No focal abscess. 3. No evidence of osteomyelitis.   Electronically Signed   By: Roxy Horseman M.D.   On: 01/16/2014 13:34   Dg Foot Complete Left  01/15/2014   CLINICAL DATA:  Foot ulcer in history of diabetes  EXAM: LEFT FOOT - COMPLETE 3+ VIEW  COMPARISON:  12/01/2013  FINDINGS: There is an ulcer at the level of the third metatarsal head. No evidence of subcutaneous emphysema. Radiodense foreign body or osseous infection. No acute fracture. Chronic marginal erosion at the first interphalangeal joint.  IMPRESSION: Ulcer of the plantar forefoot. No evidence of foreign body or osseous infection.   Electronically Signed   By: Tiburcio Pea M.D.   On: 01/15/2014 02:55   Dg Foot Complete Right  01/15/2014   CLINICAL DATA:  Diabetes.  Foot ulcer.  EXAM: RIGHT FOOT COMPLETE - 3+ VIEW  COMPARISON:   12/01/2013  FINDINGS: There is an enlarging ulcer at the level of the plantar third metatarsal head. There is no evidence of radiodense foreign body or spreading subcutaneous emphysema. No joint narrowing or osseous erosion. No acute fracture. Chronic marginal erosion at the great toe interphalangeal joint.  IMPRESSION: Plantar forefoot ulcer without evidence of foreign body or osseous infection   Electronically Signed   By: Tiburcio Pea M.D.   On: 01/15/2014 02:59    Medications: Scheduled Meds: . insulin aspart  0-15 Units Subcutaneous TID WC  . insulin aspart protamine- aspart  20 Units Subcutaneous BID WC  . lisinopril  10 mg Oral Daily  . piperacillin-tazobactam (ZOSYN)  IV  3.375 g Intravenous Q8H  . vancomycin  1,000 mg Intravenous Q8H      LOS: 3 days   Karsynn Deweese M.D. [facility] 01/18/2014, 2:49 PM Pager: [phone]  If 7PM-7AM, please contact night-coverage [email or web address] Password [email or web address]

## 2014-01-18 NOTE — Progress Notes (Signed)
CSW Proofreader(Clinical Social Worker) spoke with pt wife at bedside. They have been unable to get fax number for court house since they are closed today. CSW provided pt/pt wife with letter and informed them to not wait for CSW tomorrow morning. Instead if they want, pt or pt wife can call first thing tomorrow morning to obtain the fax number and secretary/nursing staff could fax the letter for pt. CSW informed RN that pt has letter and someone on the unit will just need to fax it in tomorrow once pt is able to provide fax number.  At this time, there are no further CSW needs. CSW signing off.  Amry Cathy, LCSWA [phone]

## 2014-01-19 ENCOUNTER — Encounter (HOSPITAL_COMMUNITY): Payer: Self-pay | Admitting: Orthopaedic Surgery

## 2014-01-19 LAB — GLUCOSE, CAPILLARY
Glucose-Capillary: 108 mg/dL — ABNORMAL HIGH (ref 70–99)
Glucose-Capillary: 234 mg/dL — ABNORMAL HIGH (ref 70–99)

## 2014-01-19 MED ORDER — FREESTYLE LANCETS MISC: Status: DC

## 2014-01-19 MED ORDER — LISINOPRIL 20 MG PO TABS: 20.0000 mg | ORAL_TABLET | Freq: Every day | ORAL | Status: DC

## 2014-01-19 MED ORDER — DOXYCYCLINE HYCLATE 100 MG PO TABS
100.0000 mg | ORAL_TABLET | Freq: Two times a day (BID) | ORAL | Status: DC
  Administered 2014-01-19: 100 mg via ORAL
  Filled 2014-01-19 (×2): qty 1

## 2014-01-19 MED ORDER — DOXYCYCLINE HYCLATE 100 MG PO TABS: 100.0000 mg | ORAL_TABLET | Freq: Two times a day (BID) | ORAL | Status: DC

## 2014-01-19 MED ORDER — INSULIN ASPART PROT & ASPART (70-30 MIX) 100 UNIT/ML ~~LOC~~ SUSP: 20.0000 [IU] | Freq: Two times a day (BID) | SUBCUTANEOUS | Status: DC

## 2014-01-19 MED ORDER — CIPROFLOXACIN HCL 500 MG PO TABS
500.0000 mg | ORAL_TABLET | Freq: Two times a day (BID) | ORAL | Status: DC
  Administered 2014-01-19: 500 mg via ORAL
  Filled 2014-01-19 (×3): qty 1

## 2014-01-19 MED ORDER — OXYCODONE HCL 5 MG PO TABS: 5.0000 mg | ORAL_TABLET | ORAL | Status: DC | PRN

## 2014-01-19 MED ORDER — CIPROFLOXACIN HCL 500 MG PO TABS
500.0000 mg | ORAL_TABLET | Freq: Two times a day (BID) | ORAL | Status: DC
Start: 2014-01-19 — End: 2015-01-28

## 2014-01-19 MED ORDER — INSULIN SYRINGE-NEEDLE U-100 28G X 1/2" 0.5 ML MISC
Status: DC
Start: 2014-01-19 — End: 2014-01-28

## 2014-01-19 NOTE — Discharge Summary (Signed)
Physician Discharge Summary  Patient ID: Dwayne [name] MRN: [id number] DOB/AGE: Aug 01, 1980 34 y.o.  Admit date: 01/15/2014 Discharge date: 01/19/2014  Primary Care Physician:  No PCP Per Patient  Discharge Diagnoses:    . Diabetic foot ulcers bilateral  . Tobacco abuse . 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 Essential hypertension, benign  Consults: Orthopedics, Dr. Magnus Ivan   Recommendations for Outpatient Follow-up:  Please check BMET at follow-up  Patient needs good wound care and compliance which I am concerned about.     Allergies:  No Known Allergies   Discharge Medications:   Medication List         ciprofloxacin 500 MG tablet  Commonly known as:  CIPRO  Take 1 tablet (500 mg total) by mouth 2 (two) times daily. X 2 weeks     doxycycline 100 MG tablet  Commonly known as:  VIBRA-TABS  Take 1 tablet (100 mg total) by mouth 2 (two) times daily. X 2 weeks     insulin aspart protamine- aspart (70-30) 100 UNIT/ML injection  Commonly known as:  NOVOLOG MIX 70/30  Inject 0.2 mLs (20 Units total) into the skin 2 (two) times daily with a meal.     lisinopril 20 MG tablet  Commonly known as:  PRINIVIL,ZESTRIL  Take 1 tablet (20 mg total) by mouth daily.  Start taking on:  01/20/2014     metFORMIN 500 MG tablet  Commonly known as:  GLUCOPHAGE  Take 1 tablet (500 mg total) by mouth 2 (two) times daily with a meal.     oxyCODONE 5 MG immediate release tablet  Commonly known as:  Oxy IR/ROXICODONE  Take 1 tablet (5 mg total) by mouth every 4 (four) hours as needed for moderate pain or severe pain.         Brief H and P: For complete details please refer to admission H and P, but in brief 34 year old male with a history of diabetes mellitus presents with bilateral diabetic foot ulcers, left greater than right. The patient stated that the ulcer on his right foot began around June 2014 and ulcer on his left foot began around October 2014.  He stated that the left foot ulcer has gotten deeper and has been draining malodorous material for the past month. Most recently, the patient was treated with Cipro when he was discharged from the ED on 12/01/2013. Because of financial constraints, the patient has been in and out of multiple EDs for his diabetic foot ulcers. The patient stated that he has been on metformin for the better part of a year. Again, most of his prescriptions have been through emergency departments. Today, the patient went to [facility]. He was noted to have a blood sugar of >500. The patient was given 15 units subcutaneous insulin and sent to the emergency department. He states that he was diagnosed as a diabetic at age 54 at which time he was placed on insulin. The patient denied fevers, chills, chest discomfort, shortness of breath, nausea, vomiting, diarrhea, abdominal pain, dysuria, hematuria.  In ED, the patient was started on vancomycin and Zosyn. Serum creatinine was 1.13. BMP otherwise was unremarkable. CBC was unremarkable. Plain films of his left foot were negative for subcutaneous emphysema or foreign body. Serum glucose was 267 with anion gap 14.   Hospital Course:  Diabetic foot infection bilateral, diabetic foot ulcers, stage II status post debridement: patient was admitted and underwent full workup. Foot x-rays reviewed and showed osteomyelitis. Both feet has  ulcers on the plantar forefoot with foul-smelling discharge.  MRI of the right and left foot showed cellulitis with myositis but no evidence of osteomyelitis or abscess in both feet. Patient was placed on IV vancomycin and Zosyn.  Orthopedics was consulted, and patient was seen by Dr. Magnus Ivan, status post debridement on 01/16/2014. Blood cultures were negative to date. I spoke with Dr Magnus Ivan who stated that they were able to debride his bilateral forefoot plantar ulcers back to good bleeding tissue and he will need good wound care, oral antibiotics in  followup in office in one week    Tobacco abuse - Patient counseled against smoking   Diabetes mellitus: Uncontrolled, hemoglobin A1c 14.1 . CBG's improving, I am concerned that patient will not be compliant with his medications/ insulin, he was eating unapproved food (pizza) during hospitalization. Discussed with diabetic coordinator, will place on NPH insulin for cost and less insulin/shots requirement.   Medical noncompliance  - Per patient and his wife, he has been following with the ER for his regular diabetic care, does not have any PCP, wound care.  - He is going to need proper wound care to his feet, antibiotics, insulin due to uncontrolled diabetes with hemoglobin A1c of 14.1.   Hypertension - Started on lisinopril 20 mg daily   Hypokalemia: Replaced    Day of Discharge BP 174/94  Pulse 98  Temp(Src) 101.2 F (38.4 C) (Oral)  Resp 16  Ht 6\' 2"  (1.88 m)  Wt 100.744 kg (222 lb 1.6 oz)  BMI 28.50 kg/m2  SpO2 100%  Physical Exam: General: Alert and awake oriented x3 not in any acute distress. CVS: S1-S2 clear no murmur rubs or gallops Chest: clear to auscultation bilaterally, no wheezing rales or rhonchi Abdomen: soft nontender, nondistended, normal bowel sounds Extremities: no cyanosis, clubbing or edema noted bilaterally. B/l feet dressing intact Neuro: Cranial nerves II-XII intact, no focal neurological deficits   The results of significant diagnostics from this hospitalization (including imaging, microbiology, ancillary and laboratory) are listed below for reference.    LAB RESULTS: Basic Metabolic Panel:  Recent Labs Lab 01/17/14 0500 01/18/14 0501  NA 138 141  K 3.5* 3.2*  CL 104 105  CO2 21 23  GLUCOSE 229* 185*  BUN 14 10  CREATININE 1.10 1.16  CALCIUM 8.6 8.6   Liver Function Tests: No results found for this basename: AST, ALT, ALKPHOS, BILITOT, PROT, ALBUMIN,  in the last 168 hours No results found for this basename: LIPASE, AMYLASE,  in the last  168 hours No results found for this basename: AMMONIA,  in the last 168 hours CBC:  Recent Labs Lab 01/15/14 0205  01/17/14 0500 01/18/14 0501  WBC 7.8  < > 6.8 8.1  NEUTROABS 4.7  --   --   --   HGB 13.4  < > 11.6* 11.5*  HCT 37.8*  < > 32.0* 32.1*  MCV 82.7  < > 81.2 80.9  PLT 204  < > 162 166  < > = values in this interval not displayed. Cardiac Enzymes: No results found for this basename: CKTOTAL, CKMB, CKMBINDEX, TROPONINI,  in the last 168 hours BNP: No components found with this basename: POCBNP,  CBG:  Recent Labs Lab 01/19/14 0757 01/19/14 1144  GLUCAP 108* 234*    Significant Diagnostic Studies:  Mr Foot Right W Wo Contrast  01/16/2014   CLINICAL DATA:  Bilateral diabetic foot ulcers.  EXAM: MRI OF THE RIGHT FOREFOOT WITHOUT AND WITH CONTRAST  TECHNIQUE: Multiplanar, multisequence  MR imaging was performed both before and after administration of intravenous contrast.  CONTRAST:  20 ml MultiHance.  COMPARISON:  Radiographs 01/15/2014.  FINDINGS: As demonstrated radiographically, there is plantar forefoot soft tissue ulceration centered at the third metatarsal head. No focal underlying fluid collection is demonstrated. There is diffuse edema and enhancement throughout the forefoot subcutaneous fat and muscles consistent with cellulitis and myositis. No focal fluid collections are identified.  There is no significant tenosynovitis. There are no significant joint effusions or abnormal synovial enhancement. There is no evidence of osteomyelitis. Mild degenerative changes at the first and second metatarsal phalangeal joints are noted.  IMPRESSION: 1. Plantar forefoot soft tissue ulceration. 2. Generalized forefoot cellulitis and myositis.  No focal abscess. 3. No evidence of osteomyelitis.   Electronically Signed   By: Roxy HorsemanBill  Veazey M.D.   On: 01/16/2014 13:29   Mr Foot Left W Wo Contrast  01/16/2014   CLINICAL DATA:  Diabetic with bilateral foot ulceration.  EXAM: MRI OF THE LEFT  FOREFOOT WITHOUT AND WITH CONTRAST  TECHNIQUE: Multiplanar, multisequence MR imaging was performed both before and after administration of intravenous contrast.  CONTRAST:  20mL MULTIHANCE GADOBENATE DIMEGLUMINE 529 MG/ML IV SOLN  COMPARISON:  Radiographs 01/15/2014.  FINDINGS: This examination is mildly motion degraded, especially on the post-contrast images. As demonstrated radiographically, there is a large area of skin ulceration over the plantar aspect of the forefoot, centered at the second and third metatarsal heads. No underlying focal fluid collection is demonstrated. There is some ill-defined T2 hyperintensity tracking along the flexor tendons. There is mild dorsal subcutaneous edema and mild generalized muscular enhancement.  Mild degenerative changes are present at the metatarsal phalangeal and interphalangeal joints of the great toe. There is no significant joint effusion. There is no evidence of osteomyelitis.  IMPRESSION: 1. Plantar forefoot soft tissue ulceration. 2. Mild cellulitis and myositis.  No focal abscess. 3. No evidence of osteomyelitis.   Electronically Signed   By: Roxy HorsemanBill  Veazey M.D.   On: 01/16/2014 13:34   Dg Foot Complete Left  01/15/2014   CLINICAL DATA:  Foot ulcer in history of diabetes  EXAM: LEFT FOOT - COMPLETE 3+ VIEW  COMPARISON:  12/01/2013  FINDINGS: There is an ulcer at the level of the third metatarsal head. No evidence of subcutaneous emphysema. Radiodense foreign body or osseous infection. No acute fracture. Chronic marginal erosion at the first interphalangeal joint.  IMPRESSION: Ulcer of the plantar forefoot. No evidence of foreign body or osseous infection.   Electronically Signed   By: Tiburcio PeaJonathan  Watts M.D.   On: 01/15/2014 02:55   Dg Foot Complete Right  01/15/2014   CLINICAL DATA:  Diabetes.  Foot ulcer.  EXAM: RIGHT FOOT COMPLETE - 3+ VIEW  COMPARISON:  12/01/2013  FINDINGS: There is an enlarging ulcer at the level of the plantar third metatarsal head. There is  no evidence of radiodense foreign body or spreading subcutaneous emphysema. No joint narrowing or osseous erosion. No acute fracture. Chronic marginal erosion at the great toe interphalangeal joint.  IMPRESSION: Plantar forefoot ulcer without evidence of foreign body or osseous infection   Electronically Signed   By: Tiburcio PeaJonathan  Watts M.D.   On: 01/15/2014 02:59      Disposition and Follow-up:     Discharge Orders   Future Appointments Provider Department Dept Phone   01/28/2014 11:00 AM [facility] Covering Provider 2 [facility] [phone]   02/05/2014 11:30 AM [facility] Financial Counselor [facility]  [phone]   Future Orders Complete By Expires   Diet Carb Modified  As directed    Discharge instructions  As directed    Comments:     Wound care: Remove dressings bilateral feet and soak feet in soapy water this morning and every morning.  Following soak, place new piece of xeroform and then dry dressings over the wounds daily with gauze, kerlex, and ace wraps.   Discharge wound care:  As directed    Comments:     Remove dressings bilateral feet and soak feet in soapy water this morning and every morning.  Following soak, place new piece of xeroform and then dry dressings over the wounds daily with gauze, kerlex, and ace wraps.   Increase activity slowly  As directed        DISPOSITION: Home  DIET: carb modified diet   DISCHARGE FOLLOW-UP Follow-up Information   Follow up with [facility] On 01/28/2014. (@ 11:00. Please bring d/c summary. )    Contact information:   [address] [place] [address] [phone]      Follow up with [facility] On 02/05/2014. (@ 11:30 for orange card assistance for medication assistance. Please have orange card eligibilty forms filled out. )    Contact information:   [address] [place]  [address] [phone]      Follow up with Kathryne Hitch, MD. Schedule an appointment as soon as possible for a visit in 1 week.   Specialty:  Orthopedic Surgery   Contact information:   [address] [place] [address] [phone]       Time spent on Discharge: 35 mins  Signed:   Amarya Kuehl M.D. [facility] 01/19/2014, 1:09 PM Pager: [phone]

## 2014-01-19 NOTE — Progress Notes (Signed)
Inpatient Diabetes Program Recommendations  AACE/ADA: New Consensus Statement on Inpatient Glycemic Control (2013)  Target Ranges:  Prepandial:   less than 140 mg/dL      Peak postprandial:   less than 180 mg/dL (1-2 hours)      Critically ill patients:  140 - 180 mg/dL    **Patient to be discharged home on 70/30 insulin bid with meals (breakfast and supper).  **Spoke to patient about 70/30 insulin.  Explained to patient how 70/30 insulin works and explained to patient how to take his 70/30 insulin at home.    **Reviewed s/sxs of hypoglycemia with patient using teach back method.  Also explained to patient how to treat his hypoglycemia at home.   Will follow. Ambrose FinlandJeannine Johnston Sama Arauz RN, MSN, CDE Diabetes Coordinator Inpatient Diabetes Program Team Pager: [phone] (8a-10p)

## 2014-01-21 LAB — CULTURE, BLOOD (ROUTINE X 2)
CULTURE: NO GROWTH
Culture: NO GROWTH

## 2014-01-24 LAB — CULTURE, BLOOD (ROUTINE X 2)
CULTURE: NO GROWTH
Culture: NO GROWTH

## 2014-01-28 ENCOUNTER — Encounter: Payer: Self-pay | Admitting: Internal Medicine

## 2014-01-28 ENCOUNTER — Ambulatory Visit: Payer: Managed Care, Other (non HMO) | Attending: Internal Medicine | Admitting: Internal Medicine

## 2014-01-28 VITALS — BP 140/93 | HR 83 | Temp 98.2°F | Resp 16 | Ht 75.0 in | Wt 222.0 lb

## 2014-01-28 DIAGNOSIS — E1310 Other specified diabetes mellitus with ketoacidosis without coma: Secondary | ICD-10-CM | POA: Insufficient documentation

## 2014-01-28 DIAGNOSIS — E1110 Type 2 diabetes mellitus with ketoacidosis without coma: Secondary | ICD-10-CM

## 2014-01-28 LAB — GLUCOSE, POCT (MANUAL RESULT ENTRY): POC GLUCOSE: 246 mg/dL — AB (ref 70–99)

## 2014-01-28 MED ORDER — FREESTYLE LANCETS MISC: Status: DC

## 2014-01-28 MED ORDER — INSULIN ASPART PROT & ASPART (70-30 MIX) 100 UNIT/ML ~~LOC~~ SUSP: 25.0000 [IU] | Freq: Two times a day (BID) | SUBCUTANEOUS | Status: DC

## 2014-01-28 MED ORDER — "INSULIN SYRINGE-NEEDLE U-100 28G X 1/2"" 0.5 ML MISC": Status: DC

## 2014-01-28 NOTE — Progress Notes (Signed)
HFU Pt has diabetic ulcers on b/l feet that were removed last week.

## 2014-01-28 NOTE — Progress Notes (Signed)
Patient ID: Dwayne [name], male   DOB: [birth year], 34 y.o.   MRN: [id number] Patient Demographics  Dwayne [name], is a 34 y.o. male  WUJ:[id number]  [id number]:[id number]  DOB - May 08, 1980  CC: No chief complaint on file.      HPI: Dwayne [name] is a 34 y.o. male here today to establish medical care. Patient was accompanied by his wife. He was recently admitted for bilateral diabetic foot, she is following up with wound clinic and has a home nurse that changes wound dressings on a daily basis. Patient has no complaint today but to followup according to instructions given from hospital admission. He claims compliant with medications but has no glucometer at home to check blood sugar so sometimes he does not use his insulin as prescribed. He has no fever. He claims his wound is getting better. He continues to smoke cigarette, and alcohol occasionally. He has had this wounds on that his feet bilaterally for about a year, recent workups as negative for osteomyelitis. Patient completed his course of antibiotics recently. Patient has No headache, No chest pain, No abdominal pain - No Nausea, No new weakness tingling or numbness, No Cough - SOB.  No Known Allergies Past Medical History  Diagnosis Date  . Diabetes mellitus   . Hypertension    Current Outpatient Prescriptions on File Prior to Visit  Medication Sig Dispense Refill  . ciprofloxacin (CIPRO) 500 MG tablet Take 1 tablet (500 mg total) by mouth 2 (two) times daily. X 2 weeks  28 tablet  0  . doxycycline (VIBRA-TABS) 100 MG tablet Take 1 tablet (100 mg total) by mouth 2 (two) times daily. X 2 weeks  28 tablet  0  . lisinopril (PRINIVIL,ZESTRIL) 20 MG tablet Take 1 tablet (20 mg total) by mouth daily.  30 tablet  3  . metFORMIN (GLUCOPHAGE) 500 MG tablet Take 1 tablet (500 mg total) by mouth 2 (two) times daily with a meal.  60 tablet  1  . oxyCODONE (OXY IR/ROXICODONE) 5 MG immediate release tablet Take 1 tablet (5 mg total) by mouth  every 4 (four) hours as needed for moderate pain or severe pain.  30 tablet  0   No current facility-administered medications on file prior to visit.   History reviewed. No pertinent family history. History   Social History  . Marital Status: Married    Spouse Name: N/A    Number of Children: N/A  . Years of Education: N/A   Occupational History  . Not on file.   Social History Main Topics  . Smoking status: Current Every Day Smoker    Types: Cigars  . Smokeless tobacco: Never Used  . Alcohol Use: Yes  . Drug Use: No  . Sexual Activity: Not on file   Other Topics Concern  . Not on file   Social History Narrative  . No narrative on file    Review of Systems: Constitutional: Negative for fever, chills, diaphoresis, activity change, appetite change and fatigue. HENT: Negative for ear pain, nosebleeds, congestion, facial swelling, rhinorrhea, neck pain, neck stiffness and ear discharge.  Eyes: Negative for pain, discharge, redness, itching and visual disturbance. Respiratory: Negative for cough, choking, chest tightness, shortness of breath, wheezing and stridor.  Cardiovascular: Negative for chest pain, palpitations and leg swelling. Gastrointestinal: Negative for abdominal distention. Genitourinary: Negative for dysuria, urgency, frequency, hematuria, flank pain, decreased urine volume, difficulty urinating and dyspareunia.  Musculoskeletal: Negative for back pain, joint swelling, nonhealing wounds on both feet for about a  year Neurological: Negative for dizziness, tremors, seizures, syncope, facial asymmetry, speech difficulty, weakness, light-headedness, numbness and headaches.  Hematological: Negative for adenopathy. Does not bruise/bleed easily. Psychiatric/Behavioral: Negative for hallucinations, behavioral problems, confusion, dysphoric mood, decreased concentration and agitation.    Objective:   Filed Vitals:   01/28/14 1127  BP: 140/93  Pulse: 83  Temp: 98.2  F (36.8 C)  Resp: 16    Physical Exam: Constitutional: Patient appears well-developed and well-nourished. No distress. Afebrile HENT: Normocephalic, atraumatic, External right and left ear normal. Oropharynx is clear and moist.  Eyes: Conjunctivae and EOM are normal. PERRLA, no scleral icterus. Neck: Normal ROM. Neck supple. No JVD. No tracheal deviation. No thyromegaly. CVS: RRR, S1/S2 +, no murmurs, no gallops, no carotid bruit.  Pulmonary: Effort and breath sounds normal, no stridor, rhonchi, wheezes, rales.  Abdominal: Soft. BS +, no distension, tenderness, rebound or guarding.  Musculoskeletal: Bilateral lower limbs and diabetic boots and dressed, wound dressings were clean and dry, not foul-smelling  Lymphadenopathy: No lymphadenopathy noted, cervical, inguinal or axillary Neuro: Alert. Normal reflexes, muscle tone coordination. No cranial nerve deficit. Skin: Skin is warm and dry. No rash noted. Not diaphoretic. No erythema. No pallor. Psychiatric: Normal mood and affect. Behavior, judgment, thought content normal.  Lab Results  Component Value Date   WBC 8.1 01/18/2014   HGB 11.5* 01/18/2014   HCT 32.1* 01/18/2014   MCV 80.9 01/18/2014   PLT 166 01/18/2014   Lab Results  Component Value Date   CREATININE 1.16 01/18/2014   BUN 10 01/18/2014   NA 141 01/18/2014   K 3.2* 01/18/2014   CL 105 01/18/2014   CO2 23 01/18/2014    Lab Results  Component Value Date   HGBA1C 14.1* 01/15/2014   Lipid Panel  No results found for this basename: chol, trig, hdl, cholhdl, vldl, ldlcalc       Assessment and plan:   1. DM (diabetes mellitus) type 2, uncontrolled, with ketoacidosis  - Glucose (CBG) - insulin aspart protamine- aspart (NOVOLOG MIX 70/30) (70-30) 100 UNIT/ML injection; Inject 0.25 mLs (25 Units total) into the skin 2 (two) times daily with a meal.  Dispense: 3 vial; Refill: 4 - Insulin Syringe-Needle U-100 28G X 1/2" 0.5 ML MISC; Use as directed. Any generic available.   Dispense: 100 each; Refill: 12 - Lancets (FREESTYLE) lancets; Any generic available. Use as instructed  Dispense: 100 each; Refill: 12  - Ambulatory referral to Ophthalmology  Patient was extensively counseled about nutrition control We had discussed diabetic goals and blood pressure goals Patient declined referral to diabetic education classes say he has been there before  Follow up in 4 weeks or when necessary  The patient was given clear instructions to go to ER or return to medical center if symptoms don't improve, worsen or new problems develop. The patient verbalized understanding. The patient was told to call to get lab results if they haven't heard anything in the next week.     Jeanann LewandowskyJEGEDE, Denim Kalmbach, MD, MHA, FACP, FAAP [facility], [place] [phone]   01/28/2014, 12:15 PM

## 2014-02-05 ENCOUNTER — Ambulatory Visit: Payer: Managed Care, Other (non HMO)

## 2014-02-19 ENCOUNTER — Telehealth: Payer: Self-pay | Admitting: Internal Medicine

## 2014-02-19 ENCOUNTER — Telehealth: Payer: Self-pay

## 2014-02-19 NOTE — Telephone Encounter (Signed)
Dwayne JohnsKathleen from the Home health care called regarding [name] Blood pressure. She states that [name] blood pressure was at 178/118, she ask pt if he has taken his medication he states that he has at 3 pm. Please contact Dwayne JohnsKathleen from [facility] for more information

## 2014-02-19 NOTE — Telephone Encounter (Signed)
Returned Dwayne [name] from [facility] phone call Unavailable-left message to return our call

## 2014-02-23 ENCOUNTER — Ambulatory Visit: Payer: Self-pay

## 2014-03-04 ENCOUNTER — Ambulatory Visit: Payer: Managed Care, Other (non HMO) | Admitting: Internal Medicine

## 2014-03-11 ENCOUNTER — Telehealth: Payer: Self-pay | Admitting: Internal Medicine

## 2014-03-11 NOTE — Telephone Encounter (Signed)
[facility] calling to notify that pt is being d/c, wife changes dressings daily. Pt has been a little non compliant in terms of missing Orange Card appointments which would help him obtain meds. [facility] social worker has made home visits to discuss resources. Pt has insulin but is out of most other meds. Pt has rescheduled [employer] appt.

## 2014-03-16 ENCOUNTER — Ambulatory Visit: Payer: Self-pay | Attending: Internal Medicine | Admitting: Internal Medicine

## 2014-03-16 DIAGNOSIS — I10 Essential (primary) hypertension: Secondary | ICD-10-CM

## 2014-03-16 LAB — BASIC METABOLIC PANEL
BUN: 14 mg/dL (ref 6–23)
CHLORIDE: 103 meq/L (ref 96–112)
CO2: 31 mEq/L (ref 19–32)
CREATININE: 1.24 mg/dL (ref 0.50–1.35)
Calcium: 9.1 mg/dL (ref 8.4–10.5)
Glucose, Bld: 221 mg/dL — ABNORMAL HIGH (ref 70–99)
POTASSIUM: 4.2 meq/L (ref 3.5–5.3)
Sodium: 140 mEq/L (ref 135–145)

## 2014-03-16 LAB — CBC WITH DIFFERENTIAL/PLATELET
BASOS ABS: 0 10*3/uL (ref 0.0–0.1)
Basophils Relative: 1 % (ref 0–1)
Eosinophils Absolute: 0.1 10*3/uL (ref 0.0–0.7)
Eosinophils Relative: 2 % (ref 0–5)
HCT: 39.9 % (ref 39.0–52.0)
Hemoglobin: 13.4 g/dL (ref 13.0–17.0)
LYMPHS PCT: 24 % (ref 12–46)
Lymphs Abs: 1.1 10*3/uL (ref 0.7–4.0)
MCH: 28.6 pg (ref 26.0–34.0)
MCHC: 33.6 g/dL (ref 30.0–36.0)
MCV: 85.1 fL (ref 78.0–100.0)
Monocytes Absolute: 0.4 10*3/uL (ref 0.1–1.0)
Monocytes Relative: 8 % (ref 3–12)
NEUTROS ABS: 3 10*3/uL (ref 1.7–7.7)
NEUTROS PCT: 65 % (ref 43–77)
PLATELETS: 202 10*3/uL (ref 150–400)
RBC: 4.69 MIL/uL (ref 4.22–5.81)
RDW: 14.3 % (ref 11.5–15.5)
WBC: 4.6 10*3/uL (ref 4.0–10.5)

## 2014-03-16 LAB — HEMOGLOBIN A1C
Hgb A1c MFr Bld: 8.3 % — ABNORMAL HIGH (ref <5.7)
MEAN PLASMA GLUCOSE: 192 mg/dL — AB (ref <117)

## 2014-03-16 LAB — LIPID PANEL
CHOL/HDL RATIO: 4 ratio
CHOLESTEROL: 174 mg/dL (ref 0–200)
HDL: 43 mg/dL (ref >39)
LDL Cholesterol: 103 mg/dL — ABNORMAL HIGH (ref 0–99)
TRIGLYCERIDES: 139 mg/dL (ref <150)
VLDL: 28 mg/dL (ref 0–40)

## 2014-03-16 MED ORDER — FUROSEMIDE 20 MG PO TABS: 20.0000 mg | ORAL_TABLET | Freq: Every day | ORAL | Status: DC

## 2014-03-16 MED ORDER — LISINOPRIL 20 MG PO TABS: 20.0000 mg | ORAL_TABLET | Freq: Every day | ORAL | Status: DC

## 2014-03-16 MED ORDER — METFORMIN HCL 500 MG PO TABS: 500.0000 mg | ORAL_TABLET | Freq: Two times a day (BID) | ORAL | Status: DC

## 2014-03-16 MED ORDER — OXYCODONE HCL 5 MG PO TABS: 5.0000 mg | ORAL_TABLET | ORAL | Status: DC | PRN

## 2014-03-16 NOTE — Patient Instructions (Signed)

## 2014-03-23 ENCOUNTER — Ambulatory Visit: Payer: Self-pay | Attending: Internal Medicine

## 2014-03-27 NOTE — Progress Notes (Signed)
Patient ID: Dwayne [name], male   DOB: 11-Aug-1980, 34 y.o.   MRN: [id number]   CC: needs refill on medication   HPI: Pt is 34 yo male who presents to clinic requesting refill on medications. No concerns today.   No Known Allergies Past Medical History  Diagnosis Date  . Diabetes mellitus   . Hypertension    Current Outpatient Prescriptions on File Prior to Visit  Medication Sig Dispense Refill  . ciprofloxacin (CIPRO) 500 MG tablet Take 1 tablet (500 mg total) by mouth 2 (two) times daily. X 2 weeks  28 tablet  0  . doxycycline (VIBRA-TABS) 100 MG tablet Take 1 tablet (100 mg total) by mouth 2 (two) times daily. X 2 weeks  28 tablet  0  . insulin aspart protamine- aspart (NOVOLOG MIX 70/30) (70-30) 100 UNIT/ML injection Inject 0.25 mLs (25 Units total) into the skin 2 (two) times daily with a meal.  3 vial  4  . Insulin Syringe-Needle U-100 28G X 1/2" 0.5 ML MISC Use as directed. Any generic available.  100 each  12  . Lancets (FREESTYLE) lancets Any generic available. Use as instructed  100 each  12   No current facility-administered medications on file prior to visit.   No family history on file. History   Social History  . Marital Status: Married    Spouse Name: N/A    Number of Children: N/A  . Years of Education: N/A   Occupational History  . Not on file.   Social History Main Topics  . Smoking status: Current Every Day Smoker    Types: Cigars  . Smokeless tobacco: Never Used  . Alcohol Use: Yes  . Drug Use: No  . Sexual Activity: Not on file   Other Topics Concern  . Not on file   Social History Narrative  . No narrative on file    Review of Systems  Constitutional: Negative for fever, chills, diaphoresis, activity change, appetite change and fatigue.  HENT: Negative for ear pain, nosebleeds, congestion, facial swelling, rhinorrhea, neck pain, neck stiffness and ear discharge.   Eyes: Negative for pain, discharge, redness, itching and visual disturbance.   Respiratory: Negative for cough, choking, chest tightness, shortness of breath, wheezing and stridor.   Cardiovascular: Negative for chest pain, palpitations and leg swelling.  Gastrointestinal: Negative for abdominal distention.  Genitourinary: Negative for dysuria, urgency, frequency, hematuria, flank pain, decreased urine volume, difficulty urinating and dyspareunia.  Musculoskeletal: Negative for back pain, joint swelling, arthralgias and gait problem.  Neurological: Negative for dizziness, tremors, seizures, syncope, facial asymmetry, speech difficulty, weakness, light-headedness, numbness and headaches.  Hematological: Negative for adenopathy. Does not bruise/bleed easily.  Psychiatric/Behavioral: Negative for hallucinations, behavioral problems, confusion, dysphoric mood, decreased concentration and agitation.    Objective:  There were no vitals filed for this visit.  Physical Exam  Constitutional: Appears well-developed and well-nourished. No distress.  CVS: RRR, S1/S2 +, no murmurs, no gallops, no carotid bruit.  Pulmonary: Effort and breath sounds normal, no stridor, rhonchi, wheezes, rales.  Abdominal: Soft. BS +,  no distension, tenderness, rebound or guarding.   Lab Results  Component Value Date   WBC 4.6 03/16/2014   HGB 13.4 03/16/2014   HCT 39.9 03/16/2014   MCV 85.1 03/16/2014   PLT 202 03/16/2014   Lab Results  Component Value Date   CREATININE 1.24 03/16/2014   BUN 14 03/16/2014   NA 140 03/16/2014   K 4.2 03/16/2014   CL 103 03/16/2014   CO2  31 03/16/2014    Lab Results  Component Value Date   HGBA1C 8.3* 03/16/2014   Lipid Panel     Component Value Date/Time   CHOL 174 03/16/2014 1239   TRIG 139 03/16/2014 1239   HDL 43 03/16/2014 1239   CHOLHDL 4.0 03/16/2014 1239   VLDL 28 03/16/2014 1239   LDLCALC 103* 03/16/2014 1239       Assessment and plan:   Patient Active Problem List   Diagnosis Date Noted  . Essential hypertension, benign - monitor BP regularly  and call us back if the numbers are > 140/90 so that we can readjust the regimen  01/18/2014  . Noncompliance - discussed in detail  01/18/2014  . Diabetes mellitus due to underlying condition with foot ulcer - continue current medical regimen  01/15/2014

## 2014-12-31 HISTORY — PX: CARDIAC CATHETERIZATION: SHX172

## 2015-01-09 ENCOUNTER — Inpatient Hospital Stay (HOSPITAL_COMMUNITY): Payer: Medicaid Other

## 2015-01-09 ENCOUNTER — Encounter (HOSPITAL_COMMUNITY): Payer: Self-pay | Admitting: Emergency Medicine

## 2015-01-09 ENCOUNTER — Inpatient Hospital Stay (HOSPITAL_COMMUNITY)
Admission: EM | Admit: 2015-01-09 | Discharge: 2015-01-28 | DRG: 853 | Disposition: A | Discharge status: Home Health | Payer: Medicaid Other | Attending: Internal Medicine | Admitting: Internal Medicine

## 2015-01-09 DIAGNOSIS — B9689 Other specified bacterial agents as the cause of diseases classified elsewhere: Secondary | ICD-10-CM | POA: Diagnosis present

## 2015-01-09 DIAGNOSIS — K036 Deposits [accretions] on teeth: Secondary | ICD-10-CM | POA: Diagnosis present

## 2015-01-09 DIAGNOSIS — I313 Pericardial effusion (noninflammatory): Secondary | ICD-10-CM | POA: Diagnosis present

## 2015-01-09 DIAGNOSIS — N289 Disorder of kidney and ureter, unspecified: Secondary | ICD-10-CM

## 2015-01-09 DIAGNOSIS — M009 Pyogenic arthritis, unspecified: Secondary | ICD-10-CM | POA: Diagnosis present

## 2015-01-09 DIAGNOSIS — M609 Myositis, unspecified: Secondary | ICD-10-CM | POA: Diagnosis present

## 2015-01-09 DIAGNOSIS — A4101 Sepsis due to Methicillin susceptible Staphylococcus aureus: Principal | ICD-10-CM | POA: Diagnosis present

## 2015-01-09 DIAGNOSIS — I340 Nonrheumatic mitral (valve) insufficiency: Secondary | ICD-10-CM | POA: Diagnosis not present

## 2015-01-09 DIAGNOSIS — E1165 Type 2 diabetes mellitus with hyperglycemia: Secondary | ICD-10-CM | POA: Diagnosis present

## 2015-01-09 DIAGNOSIS — M4644 Discitis, unspecified, thoracic region: Secondary | ICD-10-CM | POA: Insufficient documentation

## 2015-01-09 DIAGNOSIS — L97519 Non-pressure chronic ulcer of other part of right foot with unspecified severity: Secondary | ICD-10-CM

## 2015-01-09 DIAGNOSIS — I248 Other forms of acute ischemic heart disease: Secondary | ICD-10-CM | POA: Diagnosis present

## 2015-01-09 DIAGNOSIS — R6521 Severe sepsis with septic shock: Secondary | ICD-10-CM | POA: Diagnosis present

## 2015-01-09 DIAGNOSIS — E876 Hypokalemia: Secondary | ICD-10-CM | POA: Diagnosis not present

## 2015-01-09 DIAGNOSIS — F17290 Nicotine dependence, other tobacco product, uncomplicated: Secondary | ICD-10-CM | POA: Diagnosis present

## 2015-01-09 DIAGNOSIS — M25519 Pain in unspecified shoulder: Secondary | ICD-10-CM | POA: Diagnosis present

## 2015-01-09 DIAGNOSIS — M4624 Osteomyelitis of vertebra, thoracic region: Secondary | ICD-10-CM | POA: Diagnosis present

## 2015-01-09 DIAGNOSIS — E46 Unspecified protein-calorie malnutrition: Secondary | ICD-10-CM | POA: Diagnosis present

## 2015-01-09 DIAGNOSIS — R197 Diarrhea, unspecified: Secondary | ICD-10-CM | POA: Diagnosis present

## 2015-01-09 DIAGNOSIS — Z9119 Patient's noncompliance with other medical treatment and regimen: Secondary | ICD-10-CM | POA: Diagnosis present

## 2015-01-09 DIAGNOSIS — I76 Septic arterial embolism: Secondary | ICD-10-CM

## 2015-01-09 DIAGNOSIS — R7881 Bacteremia: Secondary | ICD-10-CM | POA: Diagnosis present

## 2015-01-09 DIAGNOSIS — I119 Hypertensive heart disease without heart failure: Secondary | ICD-10-CM

## 2015-01-09 DIAGNOSIS — I130 Hypertensive heart and chronic kidney disease with heart failure and stage 1 through stage 4 chronic kidney disease, or unspecified chronic kidney disease: Secondary | ICD-10-CM | POA: Diagnosis present

## 2015-01-09 DIAGNOSIS — Z91199 Patient's noncompliance with other medical treatment and regimen due to unspecified reason: Secondary | ICD-10-CM

## 2015-01-09 DIAGNOSIS — Z794 Long term (current) use of insulin: Secondary | ICD-10-CM

## 2015-01-09 DIAGNOSIS — I10 Essential (primary) hypertension: Secondary | ICD-10-CM

## 2015-01-09 DIAGNOSIS — M659 Synovitis and tenosynovitis, unspecified: Secondary | ICD-10-CM | POA: Diagnosis present

## 2015-01-09 DIAGNOSIS — I38 Endocarditis, valve unspecified: Secondary | ICD-10-CM

## 2015-01-09 DIAGNOSIS — I330 Acute and subacute infective endocarditis: Secondary | ICD-10-CM | POA: Diagnosis present

## 2015-01-09 DIAGNOSIS — I272 Other secondary pulmonary hypertension: Secondary | ICD-10-CM | POA: Diagnosis present

## 2015-01-09 DIAGNOSIS — K045 Chronic apical periodontitis: Secondary | ICD-10-CM | POA: Diagnosis present

## 2015-01-09 DIAGNOSIS — I739 Peripheral vascular disease, unspecified: Secondary | ICD-10-CM | POA: Diagnosis present

## 2015-01-09 DIAGNOSIS — E860 Dehydration: Secondary | ICD-10-CM | POA: Diagnosis present

## 2015-01-09 DIAGNOSIS — K088 Other specified disorders of teeth and supporting structures: Secondary | ICD-10-CM | POA: Diagnosis present

## 2015-01-09 DIAGNOSIS — B349 Viral infection, unspecified: Secondary | ICD-10-CM | POA: Diagnosis present

## 2015-01-09 DIAGNOSIS — E871 Hypo-osmolality and hyponatremia: Secondary | ICD-10-CM | POA: Diagnosis present

## 2015-01-09 DIAGNOSIS — R0602 Shortness of breath: Secondary | ICD-10-CM

## 2015-01-09 DIAGNOSIS — L97529 Non-pressure chronic ulcer of other part of left foot with unspecified severity: Secondary | ICD-10-CM

## 2015-01-09 DIAGNOSIS — I5031 Acute diastolic (congestive) heart failure: Secondary | ICD-10-CM | POA: Diagnosis present

## 2015-01-09 DIAGNOSIS — Z9114 Patient's other noncompliance with medication regimen: Secondary | ICD-10-CM | POA: Diagnosis present

## 2015-01-09 DIAGNOSIS — D649 Anemia, unspecified: Secondary | ICD-10-CM | POA: Diagnosis present

## 2015-01-09 DIAGNOSIS — N183 Chronic kidney disease, stage 3 (moderate): Secondary | ICD-10-CM | POA: Diagnosis present

## 2015-01-09 DIAGNOSIS — E1110 Type 2 diabetes mellitus with ketoacidosis without coma: Secondary | ICD-10-CM | POA: Insufficient documentation

## 2015-01-09 DIAGNOSIS — M79659 Pain in unspecified thigh: Secondary | ICD-10-CM | POA: Diagnosis present

## 2015-01-09 DIAGNOSIS — M549 Dorsalgia, unspecified: Secondary | ICD-10-CM

## 2015-01-09 DIAGNOSIS — E11621 Type 2 diabetes mellitus with foot ulcer: Secondary | ICD-10-CM | POA: Diagnosis present

## 2015-01-09 DIAGNOSIS — R778 Other specified abnormalities of plasma proteins: Secondary | ICD-10-CM | POA: Insufficient documentation

## 2015-01-09 DIAGNOSIS — K0530 Chronic periodontitis, unspecified: Secondary | ICD-10-CM | POA: Diagnosis present

## 2015-01-09 DIAGNOSIS — K056 Periodontal disease, unspecified: Secondary | ICD-10-CM

## 2015-01-09 DIAGNOSIS — M869 Osteomyelitis, unspecified: Secondary | ICD-10-CM | POA: Insufficient documentation

## 2015-01-09 DIAGNOSIS — M60052 Infective myositis, left thigh: Secondary | ICD-10-CM | POA: Insufficient documentation

## 2015-01-09 DIAGNOSIS — I669 Occlusion and stenosis of unspecified cerebral artery: Secondary | ICD-10-CM | POA: Diagnosis present

## 2015-01-09 DIAGNOSIS — I301 Infective pericarditis: Secondary | ICD-10-CM | POA: Diagnosis present

## 2015-01-09 DIAGNOSIS — R7989 Other specified abnormal findings of blood chemistry: Secondary | ICD-10-CM

## 2015-01-09 DIAGNOSIS — R51 Headache: Secondary | ICD-10-CM | POA: Diagnosis present

## 2015-01-09 DIAGNOSIS — L97509 Non-pressure chronic ulcer of other part of unspecified foot with unspecified severity: Secondary | ICD-10-CM

## 2015-01-09 DIAGNOSIS — Z720 Tobacco use: Secondary | ICD-10-CM | POA: Diagnosis present

## 2015-01-09 DIAGNOSIS — K029 Dental caries, unspecified: Secondary | ICD-10-CM | POA: Diagnosis present

## 2015-01-09 DIAGNOSIS — R52 Pain, unspecified: Secondary | ICD-10-CM

## 2015-01-09 DIAGNOSIS — IMO0002 Reserved for concepts with insufficient information to code with codable children: Secondary | ICD-10-CM

## 2015-01-09 DIAGNOSIS — N179 Acute kidney failure, unspecified: Secondary | ICD-10-CM | POA: Diagnosis not present

## 2015-01-09 DIAGNOSIS — M79606 Pain in leg, unspecified: Secondary | ICD-10-CM | POA: Diagnosis present

## 2015-01-09 DIAGNOSIS — R739 Hyperglycemia, unspecified: Secondary | ICD-10-CM | POA: Diagnosis present

## 2015-01-09 DIAGNOSIS — I509 Heart failure, unspecified: Secondary | ICD-10-CM

## 2015-01-09 DIAGNOSIS — A419 Sepsis, unspecified organism: Secondary | ICD-10-CM | POA: Diagnosis present

## 2015-01-09 DIAGNOSIS — D72829 Elevated white blood cell count, unspecified: Secondary | ICD-10-CM | POA: Insufficient documentation

## 2015-01-09 DIAGNOSIS — B958 Unspecified staphylococcus as the cause of diseases classified elsewhere: Secondary | ICD-10-CM | POA: Diagnosis present

## 2015-01-09 DIAGNOSIS — K089 Disorder of teeth and supporting structures, unspecified: Secondary | ICD-10-CM

## 2015-01-09 DIAGNOSIS — N189 Chronic kidney disease, unspecified: Secondary | ICD-10-CM

## 2015-01-09 DIAGNOSIS — R509 Fever, unspecified: Secondary | ICD-10-CM

## 2015-01-09 DIAGNOSIS — F17200 Nicotine dependence, unspecified, uncomplicated: Secondary | ICD-10-CM | POA: Diagnosis present

## 2015-01-09 DIAGNOSIS — M79652 Pain in left thigh: Secondary | ICD-10-CM

## 2015-01-09 HISTORY — DX: Acute kidney failure, unspecified: N17.9

## 2015-01-09 HISTORY — DX: Reserved for concepts with insufficient information to code with codable children: IMO0002

## 2015-01-09 HISTORY — DX: Sepsis, unspecified organism: A41.9

## 2015-01-09 HISTORY — DX: Unspecified staphylococcus as the cause of diseases classified elsewhere: B95.8

## 2015-01-09 HISTORY — DX: Type 2 diabetes mellitus with other skin complications: E11.628

## 2015-01-09 HISTORY — DX: Bacteremia: R78.81

## 2015-01-09 HISTORY — DX: Type 2 diabetes mellitus with hyperglycemia: E11.65

## 2015-01-09 HISTORY — DX: Severe sepsis with septic shock: R65.21

## 2015-01-09 HISTORY — DX: Sepsis due to methicillin susceptible Staphylococcus aureus: A41.01

## 2015-01-09 HISTORY — DX: Infective myositis, left thigh: M60.052

## 2015-01-09 HISTORY — DX: Tobacco use: Z72.0

## 2015-01-09 HISTORY — DX: Nonrheumatic mitral (valve) insufficiency: I34.0

## 2015-01-09 HISTORY — DX: Acute and subacute infective endocarditis: I33.0

## 2015-01-09 HISTORY — DX: Local infection of the skin and subcutaneous tissue, unspecified: L08.9

## 2015-01-09 HISTORY — DX: Essential (primary) hypertension: I10

## 2015-01-09 HISTORY — DX: Patient's noncompliance with other medical treatment and regimen: Z91.19

## 2015-01-09 LAB — BASIC METABOLIC PANEL
Anion gap: 8 (ref 5–15)
BUN: 17 mg/dL (ref 6–23)
CO2: 23 mmol/L (ref 19–32)
Calcium: 7.7 mg/dL — ABNORMAL LOW (ref 8.4–10.5)
Chloride: 105 mEq/L (ref 96–112)
Creatinine, Ser: 1.86 mg/dL — ABNORMAL HIGH (ref 0.50–1.35)
GFR calc Af Amer: 53 mL/min — ABNORMAL LOW (ref >90)
GFR calc non Af Amer: 46 mL/min — ABNORMAL LOW (ref >90)
GLUCOSE: 253 mg/dL — AB (ref 70–99)
Potassium: 3 mmol/L — ABNORMAL LOW (ref 3.5–5.1)
SODIUM: 136 mmol/L (ref 135–145)

## 2015-01-09 LAB — COMPREHENSIVE METABOLIC PANEL
ALBUMIN: 2.1 g/dL — AB (ref 3.5–5.2)
ALT: 19 U/L (ref 0–53)
ANION GAP: 9 (ref 5–15)
AST: 25 U/L (ref 0–37)
Alkaline Phosphatase: 86 U/L (ref 39–117)
BILIRUBIN TOTAL: 0.6 mg/dL (ref 0.3–1.2)
BUN: 16 mg/dL (ref 6–23)
CALCIUM: 8.1 mg/dL — AB (ref 8.4–10.5)
CHLORIDE: 100 meq/L (ref 96–112)
CO2: 26 mmol/L (ref 19–32)
Creatinine, Ser: 2.06 mg/dL — ABNORMAL HIGH (ref 0.50–1.35)
GFR, EST AFRICAN AMERICAN: 47 mL/min — AB (ref >90)
GFR, EST NON AFRICAN AMERICAN: 40 mL/min — AB (ref >90)
GLUCOSE: 275 mg/dL — AB (ref 70–99)
Potassium: 3.1 mmol/L — ABNORMAL LOW (ref 3.5–5.1)
SODIUM: 135 mmol/L (ref 135–145)
TOTAL PROTEIN: 6 g/dL (ref 6.0–8.3)

## 2015-01-09 LAB — CBC WITH DIFFERENTIAL/PLATELET
BASOS PCT: 0 % (ref 0–1)
Basophils Absolute: 0 10*3/uL (ref 0.0–0.1)
EOS PCT: 0 % (ref 0–5)
Eosinophils Absolute: 0 10*3/uL (ref 0.0–0.7)
HCT: 34.7 % — ABNORMAL LOW (ref 39.0–52.0)
HEMOGLOBIN: 11.7 g/dL — AB (ref 13.0–17.0)
Lymphocytes Relative: 6 % — ABNORMAL LOW (ref 12–46)
Lymphs Abs: 0.7 10*3/uL (ref 0.7–4.0)
MCH: 27.9 pg (ref 26.0–34.0)
MCHC: 33.7 g/dL (ref 30.0–36.0)
MCV: 82.6 fL (ref 78.0–100.0)
Monocytes Absolute: 1.2 10*3/uL — ABNORMAL HIGH (ref 0.1–1.0)
Monocytes Relative: 11 % (ref 3–12)
Neutro Abs: 9.3 10*3/uL — ABNORMAL HIGH (ref 1.7–7.7)
Neutrophils Relative %: 83 % — ABNORMAL HIGH (ref 43–77)
Platelets: 179 10*3/uL (ref 150–400)
RBC: 4.2 MIL/uL — AB (ref 4.22–5.81)
RDW: 12.6 % (ref 11.5–15.5)
WBC: 11.3 10*3/uL — AB (ref 4.0–10.5)

## 2015-01-09 LAB — CBC
HCT: 31.8 % — ABNORMAL LOW (ref 39.0–52.0)
HEMOGLOBIN: 10.6 g/dL — AB (ref 13.0–17.0)
MCH: 27.5 pg (ref 26.0–34.0)
MCHC: 33.3 g/dL (ref 30.0–36.0)
MCV: 82.4 fL (ref 78.0–100.0)
PLATELETS: 141 10*3/uL — AB (ref 150–400)
RBC: 3.86 MIL/uL — AB (ref 4.22–5.81)
RDW: 12.7 % (ref 11.5–15.5)
WBC: 10.5 10*3/uL (ref 4.0–10.5)

## 2015-01-09 LAB — GLUCOSE, CAPILLARY
GLUCOSE-CAPILLARY: 226 mg/dL — AB (ref 70–99)
Glucose-Capillary: 174 mg/dL — ABNORMAL HIGH (ref 70–99)
Glucose-Capillary: 96 mg/dL (ref 70–99)
Glucose-Capillary: 65 mg/dL — ABNORMAL LOW (ref 70–99)
Glucose-Capillary: 145 mg/dL — ABNORMAL HIGH (ref 70–99)

## 2015-01-09 LAB — URINE MICROSCOPIC-ADD ON

## 2015-01-09 LAB — I-STAT VENOUS BLOOD GAS, ED
ACID-BASE DEFICIT: 4 mmol/L — AB (ref 0.0–2.0)
BICARBONATE: 21 meq/L (ref 20.0–24.0)
O2 Saturation: 56 %
TCO2: 22 mmol/L (ref 0–100)
pCO2, Ven: 38.9 mmHg — ABNORMAL LOW (ref 45.0–50.0)
pH, Ven: 7.341 — ABNORMAL HIGH (ref 7.250–7.300)
pO2, Ven: 31 mmHg (ref 30.0–45.0)

## 2015-01-09 LAB — INFLUENZA PANEL BY PCR (TYPE A & B)
H1N1 flu by pcr: NOT DETECTED
Influenza A By PCR: NEGATIVE
Influenza B By PCR: NEGATIVE

## 2015-01-09 LAB — URINALYSIS, ROUTINE W REFLEX MICROSCOPIC
Bilirubin Urine: NEGATIVE
Glucose, UA: 500 mg/dL — AB
KETONES UR: NEGATIVE mg/dL
Leukocytes, UA: NEGATIVE
Nitrite: NEGATIVE
Specific Gravity, Urine: 1.033 — ABNORMAL HIGH (ref 1.005–1.030)
Urobilinogen, UA: 1 mg/dL (ref 0.0–1.0)
pH: 6 (ref 5.0–8.0)

## 2015-01-09 LAB — CLOSTRIDIUM DIFFICILE BY PCR: Toxigenic C. Difficile by PCR: NEGATIVE

## 2015-01-09 LAB — HEMOGLOBIN A1C
Hgb A1c MFr Bld: 8.1 % — ABNORMAL HIGH (ref <5.7)
Mean Plasma Glucose: 186 mg/dL — ABNORMAL HIGH (ref <117)

## 2015-01-09 LAB — MAGNESIUM: Magnesium: 1.5 mg/dL (ref 1.5–2.5)

## 2015-01-09 LAB — PREALBUMIN: Prealbumin: 11.9 mg/dL — ABNORMAL LOW (ref 17.0–34.0)

## 2015-01-09 LAB — SEDIMENTATION RATE: SED RATE: 104 mm/h — AB (ref 0–16)

## 2015-01-09 LAB — C-REACTIVE PROTEIN: CRP: 20 mg/dL — ABNORMAL HIGH (ref <0.60)

## 2015-01-09 LAB — URIC ACID: Uric Acid, Serum: 5.6 mg/dL (ref 4.0–7.8)

## 2015-01-09 MED ORDER — INSULIN ASPART 100 UNIT/ML ~~LOC~~ SOLN: 0.0000 [IU] | SUBCUTANEOUS | Status: DC

## 2015-01-09 MED ORDER — SODIUM CHLORIDE 0.9 % IV SOLN
INTRAVENOUS | Status: DC
  Administered 2015-01-09: 09:00:00 via INTRAVENOUS

## 2015-01-09 MED ORDER — ENOXAPARIN SODIUM 40 MG/0.4ML ~~LOC~~ SOLN
40.0000 mg | Freq: Every day | SUBCUTANEOUS | Status: DC
  Administered 2015-01-09 – 2015-01-12 (×4): 40 mg via SUBCUTANEOUS
  Filled 2015-01-09 (×4): qty 0.4

## 2015-01-09 MED ORDER — HYDROMORPHONE HCL 1 MG/ML IJ SOLN
0.5000 mg | INTRAMUSCULAR | Status: DC | PRN
  Administered 2015-01-09: 1 mg via INTRAVENOUS
  Filled 2015-01-09: qty 1

## 2015-01-09 MED ORDER — SODIUM CHLORIDE 0.9 % IV SOLN: INTRAVENOUS | Status: DC

## 2015-01-09 MED ORDER — INSULIN ASPART 100 UNIT/ML ~~LOC~~ SOLN
0.0000 [IU] | Freq: Every day | SUBCUTANEOUS | Status: DC
  Administered 2015-01-10: 2 [IU] via SUBCUTANEOUS
  Administered 2015-01-13: 3 [IU] via SUBCUTANEOUS
  Administered 2015-01-16 – 2015-01-23 (×3): 2 [IU] via SUBCUTANEOUS
  Administered 2015-01-25: 3 [IU] via SUBCUTANEOUS

## 2015-01-09 MED ORDER — METRONIDAZOLE IN NACL 5-0.79 MG/ML-% IV SOLN
500.0000 mg | Freq: Three times a day (TID) | INTRAVENOUS | Status: DC
  Administered 2015-01-09 – 2015-01-10 (×4): 500 mg via INTRAVENOUS
  Filled 2015-01-09 (×6): qty 100

## 2015-01-09 MED ORDER — HYDRALAZINE HCL 20 MG/ML IJ SOLN
10.0000 mg | Freq: Four times a day (QID) | INTRAMUSCULAR | Status: DC | PRN
  Administered 2015-01-09 – 2015-01-24 (×4): 10 mg via INTRAVENOUS
  Filled 2015-01-09 (×3): qty 1

## 2015-01-09 MED ORDER — POTASSIUM CHLORIDE CRYS ER 20 MEQ PO TBCR: 40.0000 meq | EXTENDED_RELEASE_TABLET | Freq: Two times a day (BID) | ORAL | Status: DC

## 2015-01-09 MED ORDER — HYDROMORPHONE HCL 1 MG/ML IJ SOLN
1.0000 mg | INTRAMUSCULAR | Status: DC | PRN
Start: 2015-01-09 — End: 2015-01-23
  Administered 2015-01-10 – 2015-01-22 (×25): 1 mg via INTRAVENOUS
  Filled 2015-01-09 (×26): qty 1

## 2015-01-09 MED ORDER — ONDANSETRON HCL 4 MG PO TABS
4.0000 mg | ORAL_TABLET | Freq: Four times a day (QID) | ORAL | Status: DC | PRN
  Administered 2015-01-09 – 2015-01-10 (×2): 4 mg via ORAL
  Filled 2015-01-09 (×2): qty 1

## 2015-01-09 MED ORDER — INSULIN ASPART 100 UNIT/ML ~~LOC~~ SOLN
0.0000 [IU] | Freq: Three times a day (TID) | SUBCUTANEOUS | Status: DC
  Administered 2015-01-09: 3 [IU] via SUBCUTANEOUS
  Administered 2015-01-10: 5 [IU] via SUBCUTANEOUS
  Administered 2015-01-11 (×2): 3 [IU] via SUBCUTANEOUS
  Administered 2015-01-12: 5 [IU] via SUBCUTANEOUS
  Administered 2015-01-13: 2 [IU] via SUBCUTANEOUS
  Administered 2015-01-14: 3 [IU] via SUBCUTANEOUS
  Administered 2015-01-14: 11 [IU] via SUBCUTANEOUS
  Administered 2015-01-15: 3 [IU] via SUBCUTANEOUS
  Administered 2015-01-15: 5 [IU] via SUBCUTANEOUS
  Administered 2015-01-16: 8 [IU] via SUBCUTANEOUS

## 2015-01-09 MED ORDER — PREDNISONE 20 MG PO TABS
40.0000 mg | ORAL_TABLET | Freq: Every day | ORAL | Status: AC
  Administered 2015-01-09 – 2015-01-10 (×2): 40 mg via ORAL
  Filled 2015-01-09 (×2): qty 2

## 2015-01-09 MED ORDER — METOPROLOL TARTRATE 25 MG PO TABS
25.0000 mg | ORAL_TABLET | Freq: Two times a day (BID) | ORAL | Status: DC
  Administered 2015-01-09 – 2015-01-15 (×14): 25 mg via ORAL
  Filled 2015-01-09 (×16): qty 1

## 2015-01-09 MED ORDER — POTASSIUM CHLORIDE CRYS ER 20 MEQ PO TBCR
40.0000 meq | EXTENDED_RELEASE_TABLET | ORAL | Status: AC
  Administered 2015-01-09: 40 meq via ORAL
  Filled 2015-01-09: qty 2

## 2015-01-09 MED ORDER — LOPERAMIDE HCL 2 MG PO CAPS
2.0000 mg | ORAL_CAPSULE | ORAL | Status: DC | PRN
  Filled 2015-01-09: qty 1

## 2015-01-09 MED ORDER — INSULIN ASPART 100 UNIT/ML ~~LOC~~ SOLN
0.0000 [IU] | Freq: Three times a day (TID) | SUBCUTANEOUS | Status: DC
Start: 2015-01-09 — End: 2015-01-09
  Administered 2015-01-09: 3 [IU] via SUBCUTANEOUS

## 2015-01-09 MED ORDER — NICOTINE 7 MG/24HR TD PT24
7.0000 mg | MEDICATED_PATCH | Freq: Every day | TRANSDERMAL | Status: DC
  Filled 2015-01-09 (×20): qty 1

## 2015-01-09 MED ORDER — ONDANSETRON HCL 4 MG/2ML IJ SOLN
4.0000 mg | Freq: Four times a day (QID) | INTRAMUSCULAR | Status: DC | PRN
  Administered 2015-01-09 – 2015-01-11 (×4): 4 mg via INTRAVENOUS
  Filled 2015-01-09 (×4): qty 2

## 2015-01-09 MED ORDER — INSULIN ASPART PROT & ASPART (70-30 MIX) 100 UNIT/ML ~~LOC~~ SUSP
10.0000 [IU] | Freq: Two times a day (BID) | SUBCUTANEOUS | Status: DC
  Filled 2015-01-09: qty 10

## 2015-01-09 MED ORDER — SODIUM CHLORIDE 0.9 % IV BOLUS (SEPSIS)
1000.0000 mL | Freq: Once | INTRAVENOUS | Status: AC
  Administered 2015-01-09: 1000 mL via INTRAVENOUS

## 2015-01-09 MED ORDER — ACETAMINOPHEN 325 MG PO TABS
650.0000 mg | ORAL_TABLET | Freq: Four times a day (QID) | ORAL | Status: DC | PRN
  Administered 2015-01-09 – 2015-01-25 (×20): 650 mg via ORAL
  Filled 2015-01-09 (×21): qty 2

## 2015-01-09 MED ORDER — INSULIN ASPART PROT & ASPART (70-30 MIX) 100 UNIT/ML ~~LOC~~ SUSP
20.0000 [IU] | Freq: Two times a day (BID) | SUBCUTANEOUS | Status: DC
  Administered 2015-01-09 – 2015-01-16 (×13): 20 [IU] via SUBCUTANEOUS
  Filled 2015-01-09: qty 10

## 2015-01-09 MED ORDER — POTASSIUM CHLORIDE CRYS ER 20 MEQ PO TBCR: 40.0000 meq | EXTENDED_RELEASE_TABLET | Freq: Once | ORAL | Status: DC

## 2015-01-09 MED ORDER — OXYCODONE HCL 5 MG PO TABS: 5.0000 mg | ORAL_TABLET | ORAL | Status: DC | PRN

## 2015-01-09 MED ORDER — ALUM & MAG HYDROXIDE-SIMETH 200-200-20 MG/5ML PO SUSP
30.0000 mL | Freq: Four times a day (QID) | ORAL | Status: DC | PRN
Start: 2015-01-09 — End: 2015-01-28

## 2015-01-09 MED ORDER — OXYCODONE HCL 5 MG PO TABS
5.0000 mg | ORAL_TABLET | ORAL | Status: DC | PRN
  Administered 2015-01-09 – 2015-01-19 (×22): 5 mg via ORAL
  Filled 2015-01-09 (×22): qty 1

## 2015-01-09 MED ORDER — ACETAMINOPHEN 650 MG RE SUPP: 650.0000 mg | Freq: Four times a day (QID) | RECTAL | Status: DC | PRN

## 2015-01-09 MED ORDER — INFLUENZA VAC SPLIT QUAD 0.5 ML IM SUSY
0.5000 mL | PREFILLED_SYRINGE | INTRAMUSCULAR | Status: DC
  Filled 2015-01-09: qty 0.5

## 2015-01-09 NOTE — ED Provider Notes (Signed)
CSN: [id number]     Arrival date & time 01/09/15  0009 History  This chart was scribed for Toy Baker, MD by Murriel Hopper, ED Scribe. This patient was seen in room D30C/D30C and the patient's care was started at 12:38 PM.    Chief Complaint  Patient presents with  . Headache  . Nausea  . Diarrhea    The history is provided by the patient. No language interpreter was used.     HPI Comments: Dwayne [name] is a 35 y.o. male who has DM and HTN who presents to the Emergency Department complaining of watery, persistent diarrhea with associated abdominal pain, fatigue, dizziness, and light-headedness that began yesterday. Pt is on a number of medications, but states he has not been taking all of them regularly. Pt states he has dizziness and light-headedness when he stands. In addition, pt notes having bilateral leg pain as well as right arm pain. Pt states that he had surgery on the bottom of both of his feet recently to remove ulcers. 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 Pt is a diabetic, and reports that his most recent blood sugar reading was in the 250's, which was recorded earlier today. Pt does not have a PCP, and is seen for health issues at the [facility]. Pt denies cough, congestion, SOB, dysuria.     Past Medical History  Diagnosis Date  . Diabetes mellitus   . Hypertension    Past Surgical History  Procedure Laterality Date  . I&d extremity Bilateral 01/16/2014    Procedure:  DEBRIDEMENT of bilateral foot ulcers;  Surgeon: Kathryne Hitch, MD;  Location: [facility] OR;  Service: Orthopedics;  Laterality: Bilateral;   History reviewed. No pertinent family history. History  Substance Use Topics  . Smoking status: Current Every Day Smoker    Types: Cigars  . Smokeless tobacco: Never Used  . Alcohol Use: Yes    Review of Systems  Constitutional: Positive for  fatigue.  HENT: Negative for congestion.   Respiratory: Negative for cough and shortness of breath.   Gastrointestinal: Positive for nausea, abdominal pain and diarrhea.  Genitourinary: Negative for dysuria.  Neurological: Positive for dizziness and light-headedness.  All other systems reviewed and are negative.     Allergies  Review of patient's allergies indicates no known allergies.  Home Medications   Prior to Admission medications   Medication Sig Start Date End Date Taking? Authorizing Provider  ciprofloxacin (CIPRO) 500 MG tablet Take 1 tablet (500 mg total) by mouth 2 (two) times daily. X 2 weeks 01/19/14   Ripudeep Jenna Luo, MD  doxycycline (VIBRA-TABS) 100 MG tablet Take 1 tablet (100 mg total) by mouth 2 (two) times daily. X 2 weeks 01/19/14   Ripudeep Jenna Luo, MD  furosemide (LASIX) 20 MG tablet Take 1 tablet (20 mg total) by mouth daily. 03/16/14   Dorothea Ogle, MD  insulin aspart protamine- aspart (NOVOLOG MIX 70/30) (70-30) 100 UNIT/ML injection Inject 0.25 mLs (25 Units total) into the skin 2 (two) times daily with a meal. 01/28/14   Olugbemiga E Hyman Hopes, MD  Insulin Syringe-Needle U-100 28G X 1/2" 0.5 ML MISC Use as directed. Any generic available. 01/28/14   Quentin Angst, MD  Lancets (FREESTYLE) lancets Any generic available. Use as instructed 01/28/14   Quentin Angst, MD  lisinopril (PRINIVIL,ZESTRIL) 20 MG tablet Take 1 tablet (20 mg total) by  mouth daily. 03/16/14   Dorothea OgleIskra M Myers, MD  metFORMIN (GLUCOPHAGE) 500 MG tablet Take 1 tablet (500 mg total) by mouth 2 (two) times daily with a meal. 03/16/14   Dorothea OgleIskra M Myers, MD  oxyCODONE (OXY IR/ROXICODONE) 5 MG immediate release tablet Take 1 tablet (5 mg total) by mouth every 4 (four) hours as needed for moderate pain or severe pain. 03/16/14   Dorothea OgleIskra M Myers, MD   BP 165/98 mmHg  Pulse 120  Temp(Src) 100.8 F (38.2 C) (Oral)  Resp 20  Ht 6\' 2"  (1.88 m)  Wt 220 lb (99.791 kg)  BMI 28.23 kg/m2  SpO2 98% Physical Exam   Constitutional: He is oriented to person, place, and time. He appears well-developed and well-nourished.  Non-toxic appearance. No distress.  HENT:  Head: Normocephalic and atraumatic.  Eyes: Conjunctivae, EOM and lids are normal. Pupils are equal, round, and reactive to light.  Neck: Normal range of motion. Neck supple. No tracheal deviation present. No thyroid mass present.  Cardiovascular: Regular rhythm and normal heart sounds.  Exam reveals no gallop.   No murmur heard. Tachycardic   Pulmonary/Chest: Effort normal and breath sounds normal. No stridor. No respiratory distress. He has no decreased breath sounds. He has no wheezes. He has no rhonchi. He has no rales.  Abdominal: Soft. Normal appearance and bowel sounds are normal. He exhibits no distension. There is no tenderness. There is no rebound and no CVA tenderness.  Musculoskeletal: Normal range of motion. He exhibits no edema or tenderness.  Soles of bilateral feet Healing ulcers without signs of infection  Right hand: radial pulse 2+ Neurovascular intact capillary refill less than 2 seconds Left upper extremity normal  Neurological: He is alert and oriented to person, place, and time. He has normal strength. No cranial nerve deficit or sensory deficit. GCS eye subscore is 4. GCS verbal subscore is 5. GCS motor subscore is 6.  Skin: Skin is warm and dry. No abrasion and no rash noted.  Psychiatric: He has a normal mood and affect. His speech is normal and behavior is normal.  Nursing note and vitals reviewed.   ED Course  Procedures (including critical care time)  DIAGNOSTIC STUDIES: Oxygen Saturation is 98% on RA, normal by my interpretation.    COORDINATION OF CARE: 12:43 AM Discussed treatment plan with pt at bedside and pt agreed to plan.   Labs Review Labs Reviewed  COMPREHENSIVE METABOLIC PANEL  CBC WITH DIFFERENTIAL  URINALYSIS, ROUTINE W REFLEX MICROSCOPIC  BLOOD GAS, VENOUS    Imaging Review No results  found.   EKG Interpretation None      MDM   Final diagnoses:  None   I personally performed the services described in this documentation, which was scribed in my presence. The recorded information has been reviewed and is accurate.  Pt given iv fluids and will be admitted for dehydration   Toy BakerAnthony T Courtnee Myer, MD 01/09/15 [phone]

## 2015-01-09 NOTE — ED Notes (Signed)
Patient arrives with complaint of nausea, diarrhea, and headache. Endorses nausea but no emesis. Denies sick contacts. Additionally reports that he has diabetic ulcers on both of his feet. Is unsure of type I or II but states that he was diagnosed when [age] with diabetes. States that he is supposed to take insulin but hasn't been lately because of work stress.

## 2015-01-09 NOTE — H&P (Addendum)
[facility] Admission History and Physical       Dwayne [name] ZOX:[id number] DOB: 1980/12/02 DOA: 01/09/2015  Referring physician: EDP PCP: No PCP Per Patient  Specialists:   Chief Complaint: Diarrhea  HPI: Dwayne [name] is a 35 y.o. male with a history of Uncontrolled HTN, DM2 who presents to the ED with complaints of diarrhea and fevers and chills  x 24 hours.   He reports having weakness and fatigue and He denies any nausea or vomiting.  No one is sick at home, and he denies taking any recent antibiotic therapy.   He reports that he has been out of his medications for months, and has not seen his PCP in months as well.  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     Review of Systems:  Constitutional: No Weight Loss, No Weight Gain, Night Sweats, +Fevers, +Chills, Dizziness, +Fatigue, or +Generalized Weakness HEENT: No Headaches, Difficulty Swallowing,Tooth/Dental Problems,Sore Throat,  No Sneezing, Rhinitis, Ear Ache, Nasal Congestion, or Post Nasal Drip,  Cardio-vascular:  No Chest pain, Orthopnea, PND, Edema in Lower Extremities, Anasarca, Dizziness, Palpitations  Resp: No Dyspnea, No DOE, No Productive Cough, No Non-Productive Cough, No Hemoptysis, No Wheezing.    GI: No Heartburn, Indigestion, Abdominal Pain, Nausea, Vomiting, +Diarrhea, Hematemesis, Hematochezia, Melena, Change in Bowel Habits,  Loss of Appetite  GU: No Dysuria, Change in Color of Urine, No Urgency or Frequency, No Flank pain.  Musculoskeletal: No Joint Pain or Swelling, No Decreased Range of Motion, No Back Pain.  Neurologic: No Syncope, No Seizures, Muscle Weakness, Paresthesia, Vision Disturbance or Loss, No Diplopia, No Vertigo, No Difficulty Walking,  Skin: No Rash or Lesions. Psych: No Change in Mood or Affect, No Depression or Anxiety, No Memory loss, No Confusion, or Hallucinations   Past Medical History   Diagnosis Date  . Diabetes mellitus   . Hypertension       Past Surgical History  Procedure Laterality Date  . I&d extremity Bilateral 01/16/2014    Procedure:  DEBRIDEMENT of bilateral foot ulcers;  Surgeon: Kathryne Hitch, MD;  Location: [facility] OR;  Service: Orthopedics;  Laterality: Bilateral;       Prior to Admission medications   Medication Sig Start Date End Date Taking? Authorizing Provider  oxyCODONE (OXY IR/ROXICODONE) 5 MG immediate release tablet Take 1 tablet (5 mg total) by mouth every 4 (four) hours as needed for moderate pain or severe pain. 03/16/14  Yes Dorothea Ogle, MD  ciprofloxacin (CIPRO) 500 MG tablet Take 1 tablet (500 mg total) by mouth 2 (two) times daily. X 2 weeks Patient not taking: Reported on 01/09/2015 01/19/14   Ripudeep Jenna Luo, MD  doxycycline (VIBRA-TABS) 100 MG tablet Take 1 tablet (100 mg total) by mouth 2 (two) times daily. X 2 weeks Patient not taking: Reported on 01/09/2015 01/19/14   Ripudeep Jenna Luo, MD  furosemide (LASIX) 20 MG tablet Take 1 tablet (20 mg total) by mouth daily. Patient not taking: Reported on 01/09/2015 03/16/14   Dorothea Ogle, MD  insulin aspart protamine- aspart (NOVOLOG MIX 70/30) (70-30) 100 UNIT/ML injection Inject 0.25 mLs (25 Units total) into the skin 2 (two) times daily with a meal. Patient not taking: Reported on 01/09/2015 01/28/14   Quentin Angst, MD  Insulin Syringe-Needle U-100 28G X 1/2" 0.5 ML MISC Use as directed. Any generic available. Patient not taking: Reported on 01/09/2015 01/28/14  Quentin Angstlugbemiga E Jegede, MD  Lancets (FREESTYLE) lancets Any generic available. Use as instructed Patient not taking: Reported on 01/09/2015 01/28/14   Quentin Angstlugbemiga E Jegede, MD  lisinopril (PRINIVIL,ZESTRIL) 20 MG tablet Take 1 tablet (20 mg total) by mouth daily. Patient not taking: Reported on 01/09/2015 03/16/14   Dorothea OgleIskra M Myers, MD  metFORMIN (GLUCOPHAGE) 500 MG tablet Take 1 tablet (500 mg total) by mouth 2 (two) times daily with  a meal. Patient not taking: Reported on 01/09/2015 03/16/14   Dorothea OgleIskra M Myers, MD      No Known Allergies   Social History:  reports that he has been smoking Cigars.  He has never used smokeless tobacco. He reports that he drinks alcohol. He reports that he does not use illicit drugs.     History reviewed. No pertinent family history.     Physical Exam:  GEN:  Ill Appearing Obese  35 y.o. African American male examined  and in no acute distress; cooperative with exam Filed Vitals:   01/09/15 0017 01/09/15 0035 01/09/15 0045  BP: 165/98  185/88  Pulse: 120  109  Temp: 100.8 F (38.2 C)    TempSrc: Oral    Resp: 20    Height: 6\' 2"  (1.88 m)    Weight: 99.791 kg (220 lb)    SpO2: 98% 10% 100%   Blood pressure 185/88, pulse 109, temperature 100.8 F (38.2 C), temperature source Oral, resp. rate 20, height 6\' 2"  (1.88 m), weight 99.791 kg (220 lb), SpO2 100 %. PSYCH: He is alert and oriented x4; does not appear anxious does not appear depressed; affect is normal HEENT: Normocephalic and Atraumatic, Mucous membranes pink; PERRLA; EOM intact; Fundi:  Benign;  No scleral icterus, Nares: Patent, Oropharynx: Clear, Fair Dentition,    Neck:  FROM, No Cervical Lymphadenopathy nor Thyromegaly or Carotid Bruit; No JVD; Breasts:: Not examined CHEST WALL: No tenderness CHEST: Normal respiration, clear to auscultation bilaterally HEART: Mildly Tachycardic, Regular rhythm; no murmurs rubs or gallops BACK: No kyphosis or scoliosis; No CVA tenderness ABDOMEN: Positive Bowel Sounds, Obese, Soft Non-Tender; No Masses, No Organomegaly Rectal Exam: Not done EXTREMITIES: No Cyanosis, Clubbing, or Edema; Healed Ulcerations on Plantar Surface of Both Feet. Genitalia: not examined PULSES: 2+ and symmetric SKIN: Normal hydration no rash or ulceration CNS:  Alert and Oriented x 4,  No Focal Deficits Vascular: pulses palpable throughout    Labs on Admission:  Basic Metabolic Panel:  Recent  Labs Lab 01/09/15 0021  NA 135  K 3.1*  CL 100  CO2 26  GLUCOSE 275*  BUN 16  CREATININE 2.06*  CALCIUM 8.1*   Liver Function Tests:  Recent Labs Lab 01/09/15 0021  AST 25  ALT 19  ALKPHOS 86  BILITOT 0.6  PROT 6.0  ALBUMIN 2.1*   No results for input(s): LIPASE, AMYLASE in the last 168 hours. No results for input(s): AMMONIA in the last 168 hours. CBC:  Recent Labs Lab 01/09/15 0021  WBC 11.3*  NEUTROABS 9.3*  HGB 11.7*  HCT 34.7*  MCV 82.6  PLT 179   Cardiac Enzymes: No results for input(s): CKTOTAL, CKMB, CKMBINDEX, TROPONINI in the last 168 hours.  BNP (last 3 results) No results for input(s): PROBNP in the last 8760 hours. CBG: No results for input(s): GLUCAP in the last 168 hours.  Radiological Exams on Admission: No results found.     Assessment/Plan:   35 y.o. male with  Active Problems:   1.   Dehydration   IVFs  Monitor BUN/Cr    2.   Diarrhea- Probably Viral   Supportive Rx   IVFs   Stool for C+S ordered   Stool for C.Diff PCR ordered     3.   Hyperglycemia   SSI coverage PRN   Check HbA1C in AM     4.   Diabetes type 2, uncontrolled   Resume Diabetic Rx   Diabetic Education     5.   Uncontrolled hypertension   IV Hydralazine PRN   Start Ace Inhibitor Rx after Creatinine improves     6.   Hypokalemia-   Replete K+   Check Magnesium       7.   Hypoalbuminemia-   Malnutrition   Check PreAlbumin       8.   Diabetic foot ulcers   Resolved     9.   Noncompliance   Social work     8.   Tobacco abuse   Nicotine Patch daily      9.   DVT Prophylaxis   Lovenox   Code Status:    FULL CODE Family Communication:    Wife at Bedside Disposition Plan:      Inpatient   Time spent:  47 Minutes  Ron Parker [facility] Pager [phone]   If 7AM -7PM Please Contact the Day Rounding Team MD for [facility]  If 7PM-7AM, Please Contact Night-Floor Coverage  [email or web address] Password  [email or web address] 01/09/2015, 2:18 AM

## 2015-01-09 NOTE — ED Notes (Signed)
Pt is aware urine is needed for testing, urinal at bedside. 

## 2015-01-09 NOTE — Clinical Social Work Note (Signed)
CSW received consult for medication assistance. Consult not appropriate. [facility] and re-consult CSW for psychosocial needs. CSW signing off.  Waldon Sheerin Patrick-Jefferson, LCSWA Weekend Clinical Social Worker [phone]

## 2015-01-09 NOTE — Progress Notes (Signed)
Patient seen and examined, 35 year old male with uncontrolled diabetes, hypertension, noncompliant, ran out of his medications for months presented with diarrhea, fevers and chills. Patient reported that he was in prison and had received antibiotics a few days ago for his bilateral lower extremities wounds. BP 179/89 mmHg  Pulse 105  Temp(Src) 100.2 F (37.9 C) (Oral)  Resp 20  Ht 6' 2"  (1.88 m)  Wt 102.694 kg (226 lb 6.4 oz)  BMI 29.06 kg/m2  SpO2 98%  Agree with assessment and plan per Dr. Arnoldo Morale Fevers, diarrhea, SIRS - Follow stool studies, C. difficile, patient had received antibiotics a few days ago - Placed on IV Flagyl - Continue clear liquid diet, Zofran, IV fluids  Bilateral lower extremity diabetic ulcers - No active drainage, ordered both x-rays of the feet, possible gout arthropathy, no osteomyelitis - Ordered ESR, CRP, uric acid, placed on prednisone 40 mg x 2 doses, clinical evaluation  Uncontrolled diabetes mellitus Placed on insulin 70/30, 20 units BID, sliding-scale insulin, follow hemoglobin A1c  Hypertension Placed on Lopressor BID, hydralazine as needed   Gentri Guardado M.D. [facility] 01/09/2015, 11:00 AM  Pager: [phone]

## 2015-01-10 ENCOUNTER — Inpatient Hospital Stay (HOSPITAL_COMMUNITY): Payer: Medicaid Other

## 2015-01-10 LAB — BASIC METABOLIC PANEL
ANION GAP: 6 (ref 5–15)
BUN: 21 mg/dL (ref 6–23)
CALCIUM: 8 mg/dL — AB (ref 8.4–10.5)
CHLORIDE: 103 meq/L (ref 96–112)
CO2: 23 mmol/L (ref 19–32)
CREATININE: 2.15 mg/dL — AB (ref 0.50–1.35)
GFR calc Af Amer: 44 mL/min — ABNORMAL LOW (ref >90)
GFR, EST NON AFRICAN AMERICAN: 38 mL/min — AB (ref >90)
GLUCOSE: 103 mg/dL — AB (ref 70–99)
POTASSIUM: 3 mmol/L — AB (ref 3.5–5.1)
Sodium: 132 mmol/L — ABNORMAL LOW (ref 135–145)

## 2015-01-10 LAB — TROPONIN I
Troponin I: 0.36 ng/mL — ABNORMAL HIGH (ref <0.031)
Troponin I: 0.33 ng/mL — ABNORMAL HIGH (ref <0.031)

## 2015-01-10 LAB — MAGNESIUM: Magnesium: 1.6 mg/dL (ref 1.5–2.5)

## 2015-01-10 LAB — GLUCOSE, CAPILLARY
GLUCOSE-CAPILLARY: 212 mg/dL — AB (ref 70–99)
Glucose-Capillary: 108 mg/dL — ABNORMAL HIGH (ref 70–99)
Glucose-Capillary: 142 mg/dL — ABNORMAL HIGH (ref 70–99)
Glucose-Capillary: 234 mg/dL — ABNORMAL HIGH (ref 70–99)
Glucose-Capillary: 254 mg/dL — ABNORMAL HIGH (ref 70–99)
Glucose-Capillary: 85 mg/dL (ref 70–99)
Glucose-Capillary: 99 mg/dL (ref 70–99)

## 2015-01-10 LAB — CBC
HCT: 35.1 % — ABNORMAL LOW (ref 39.0–52.0)
Hemoglobin: 12.2 g/dL — ABNORMAL LOW (ref 13.0–17.0)
MCH: 28.4 pg (ref 26.0–34.0)
MCHC: 34.8 g/dL (ref 30.0–36.0)
MCV: 81.6 fL (ref 78.0–100.0)
Platelets: 126 10*3/uL — ABNORMAL LOW (ref 150–400)
RBC: 4.3 MIL/uL (ref 4.22–5.81)
RDW: 13 % (ref 11.5–15.5)
WBC: 13.8 10*3/uL — ABNORMAL HIGH (ref 4.0–10.5)

## 2015-01-10 LAB — LACTIC ACID, PLASMA: LACTIC ACID, VENOUS: 0.8 mmol/L (ref 0.5–2.2)

## 2015-01-10 LAB — URINE CULTURE

## 2015-01-10 MED ORDER — VANCOMYCIN HCL IN DEXTROSE 1-5 GM/200ML-% IV SOLN
1000.0000 mg | Freq: Three times a day (TID) | INTRAVENOUS | Status: DC
  Administered 2015-01-10 – 2015-01-11 (×3): 1000 mg via INTRAVENOUS
  Filled 2015-01-10 (×5): qty 200

## 2015-01-10 MED ORDER — SODIUM CHLORIDE 0.9 % IV BOLUS (SEPSIS): 500.0000 mL | Freq: Once | INTRAVENOUS | Status: DC

## 2015-01-10 MED ORDER — PIPERACILLIN-TAZOBACTAM 3.375 G IVPB
3.3750 g | Freq: Three times a day (TID) | INTRAVENOUS | Status: DC
  Administered 2015-01-10 – 2015-01-11 (×3): 3.375 g via INTRAVENOUS
  Filled 2015-01-10 (×5): qty 50

## 2015-01-10 MED ORDER — POTASSIUM CHLORIDE IN NACL 40-0.9 MEQ/L-% IV SOLN
INTRAVENOUS | Status: DC
  Administered 2015-01-10: 100 mL/h via INTRAVENOUS
  Filled 2015-01-10 (×4): qty 1000

## 2015-01-10 MED ORDER — LIVING WELL WITH DIABETES BOOK
Freq: Once | Status: DC
  Filled 2015-01-10: qty 1

## 2015-01-10 NOTE — Progress Notes (Signed)
Paged Illa LevelSahar Osman PA-C. Pt is running high temps, high BP, tachycardic, regardless of medication administrations.  Lactic acid, EKG, and blood cultures ordered.  Wound consult and/or ortho consult is needed for diabetic ulcers on bottom of both feet.   Pt stated that his diarrhea has ceased today, but he has been nauseated and vomiting. Vomit is food and drink that he could not keep down. Urine dark amber. Advised him to drink more water. Still having acute pain in right shoulder/arm.

## 2015-01-10 NOTE — Progress Notes (Signed)
Utilization review completed. Krosby Ritchie, RN, BSN. 

## 2015-01-10 NOTE — Progress Notes (Signed)
ANTIBIOTIC CONSULT NOTE - INITIAL  Pharmacy Consult for Vancomycin and Zosyn Indication: sepsis coverage  No Known Allergies  Patient Measurements: Height: _0  (188 cm) Weight: 226 lb 6.4 oz (102.694 kg) IBW/kg (Calculated) : 82.2  Vital Signs: Temp: 102.9 F (39.4 C) (01/11 0617) Temp Source: Oral (01/11 0617) BP: 192/93 mmHg (01/11 0617) Pulse Rate: 122 (01/11 0617) Intake/Output from previous day: 01/10 0701 - 01/11 0700 In: 920 [P.O.:720; IV Piggyback:200] Out: 1 [Emesis/NG output:1]  Labs:  Recent Labs  01/09/15 0021 01/09/15 0500 01/10/15 0508  WBC 11.3* 10.5 13.8*  HGB 11.7* 10.6* 12.2*  PLT 179 141* 126*  CREATININE 2.06* 1.86* 2.15*   Estimated Creatinine Clearance: 61.9 mL/min (by C-G formula based on Cr of 2.15).   Microbiology: Recent Results (from the past 720 hour(s))  Clostridium Difficile by PCR     Status: None   Collection Time: 01/09/15  4:09 AM  Result Value Ref Range Status   C difficile by pcr NEGATIVE NEGATIVE Final    Medical History: Past Medical History  Diagnosis Date  . Diabetes mellitus   . Hypertension    Assessment:  35 yr old male admitted on 01/09/14 with diarrhea, fever and chills x 24 hrs.  Flagyl given x 24hrs, stopped today with C diff negative.  To begin Vanc and Zosyn for sepsis coverage. Blood cultures sent. Temp to 103.1, WBC 13.8.  ESR 104, CRP 20.0. Hx diabetic foot ulcers.  Goal of Therapy:  Vancomycin trough level 15-20 mcg/ml  Plan:   Vancomycin 1 gram IV q8hrs.  Zosyn 3.375 gm IV q8hrs (each over 4 hours).  Follow renal function, culture data, progress.  Will plan to check Vanc trough level at steady state.  Arty Baumgartner, [place] Pager: [phone] 01/10/2015,11:21 AM

## 2015-01-10 NOTE — Progress Notes (Signed)
Rept to Dr. Susie CassetteAbrol regarding pt's persistent temp of 103 even after Tylenol. BP 176/77 pulse 121 after hydralazine IV and will give pt his dose of Lopressor this AM. Pt c/o CP and generalized "pain all over" including CP. Orders received. Will call rapid response RN for assistance for ? Transfer to stepdown. Will continue to monitor.

## 2015-01-10 NOTE — Progress Notes (Signed)
Asked to assist with patient with persistent fever and need for SDU.  On arrival patient supine in bed - moist and cool at this point - states he feels better since "sweating"  - denies CP - speaks of right shoulder pain with ROM - some foot pain - blood cultures and troponin drawn - Zoysn started - IV site good - sitting on edge of bed - NAD - bil BS clear - washed off and gown changed - patient ambulated to BR with visitor - tol well - asking for juice - denies nausea - temp now 100.9 oral - 136/73 sitting on edge of bed - denies dizziness - HR 114 RR 20 O2 sats 98% on RA.  Handoff to [employer] RN - to hang vancomycin.  Dr. Susie CassetteAbrol by - update given. To be placed on tele - NT doing so.

## 2015-01-10 NOTE — Progress Notes (Addendum)
[facility] PROGRESS NOTE  Dwayne [name] EAV:[id number] DOB: 07/24/1980 DOA: 01/09/2015 PCP: No PCP Per Patient  Assessment/Plan: Active Problems:   Tobacco abuse   Noncompliance   Dehydration   Diarrhea   Diabetes type 2, uncontrolled   Hyperglycemia   Uncontrolled hypertension   Diabetic foot ulcers   Pain   SIRS likely secondary to a viral syndrome Start the patient on broad-spectrum antibiotics vancomycin and Zosyn Repeat blood cultures, lactic acid normal Suspect viral URI but influenza PCR negative Check respiratory panel UA negative Concern about septic joint, will not discharge patient until blood culture has been negative for greater than 48 hours Currently patient has diffuse myalgias, but does not particularly report a particular joint hurting more than the other   Diarrhea C. difficile negative   Poorly controlled diabetes Hemoglobin A1c 8.1 Patient admits to being noncompliant with medications Continue current insulin regimen Diabetes coordinator consult   Hypertension Placed on Lopressor BID, hydralazine as needed  Acute renal failure Likely prerenal with a baseline creatinine of 1.24 Hydrate and monitor to see if this improves  Hypokalemia replete    Code Status: full Family Communication: family updated about patient's clinical progress Disposition Plan:  As above    Brief narrative: Dwayne [name] is a 35 y.o. male with a history of Uncontrolled HTN, DM2 who presents to the ED with complaints of diarrhea and fevers and chills x 24 hours. He reports having weakness and fatigue and He denies any nausea or vomiting. No one is sick at home, and he denies taking any recent antibiotic therapy. He reports that he has been out of his medications for months, and has not seen his PCP in months as well. 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    Consultants:  None  Procedures:  None  Antibiotics: Flagyl discontinued < 1/11 Vancomycin and Zosyn > 1/11  HPI/Subjective: Patient tachycardic, hypertensive, febrile Diarrhea has improved, still complaining of nausea and vomiting Nausea improved , Denies significant right shoulder pain   Objective: Filed Vitals:   01/09/15 2103 01/09/15 2219 01/10/15 0144 01/10/15 0617  BP: 193/104 182/80 157/76 192/93  Pulse: 114 124 115 122  Temp: 102.8 F (39.3 C)  103.1 F (39.5 C) 102.9 F (39.4 C)  TempSrc: Oral  Oral Oral  Resp: 18  18 18   Height:      Weight:      SpO2: 98%  97% 99%    Intake/Output Summary (Last 24 hours) at 01/10/15 1051 Last data filed at 01/09/15 2214  Gross per 24 hour  Intake    580 ml  Output      1 ml  Net    579 ml    Exam:  General: alert & oriented x 3 In NAD  Cardiovascular: RRR, nl S1 s2  Respiratory: Decreased breath sounds at the bases, scattered rhonchi, no crackles  Abdomen: soft +BS NT/ND, no masses palpable  Extremities: No cyanosis and no edema      Data Reviewed: Basic Metabolic Panel:  Recent Labs Lab 01/09/15 0021 01/09/15 0500 01/10/15 0508  NA 135 136 132*  K 3.1* 3.0* 3.0*  CL 100 105 103  CO2 26 23 23   GLUCOSE 275* 253* 103*  BUN 16 17 21   CREATININE 2.06* 1.86* 2.15*  CALCIUM 8.1* 7.7* 8.0*  MG  --  1.5  --     Liver Function Tests:  Recent  Labs Lab 01/09/15 0021  AST 25  ALT 19  ALKPHOS 86  BILITOT 0.6  PROT 6.0  ALBUMIN 2.1*   No results for input(s): LIPASE, AMYLASE in the last 168 hours. No results for input(s): AMMONIA in the last 168 hours.  CBC:  Recent Labs Lab 01/09/15 0021 01/09/15 0500 01/10/15 0508  WBC 11.3* 10.5 13.8*  NEUTROABS 9.3*  --   --   HGB 11.7* 10.6* 12.2*  HCT 34.7* 31.8* 35.1*  MCV 82.6 82.4 81.6  PLT 179 141* 126*    Cardiac Enzymes: No results for input(s): CKTOTAL, CKMB, CKMBINDEX, TROPONINI in the last 168 hours. BNP (last 3 results) No  results for input(s): PROBNP in the last 8760 hours.   CBG:  Recent Labs Lab 01/09/15 2211 01/09/15 2258 01/10/15 0054 01/10/15 0435 01/10/15 0750  GLUCAP 65* 145* 99 85 142*    Recent Results (from the past 240 hour(s))  Clostridium Difficile by PCR     Status: None   Collection Time: 01/09/15  4:09 AM  Result Value Ref Range Status   C difficile by pcr NEGATIVE NEGATIVE Final     Studies: Dg Shoulder Right  01/09/2015   CLINICAL DATA:  Anterior right shoulder pain  EXAM: RIGHT SHOULDER - 2+ VIEW  COMPARISON:  None.  FINDINGS: No fracture or dislocation is seen.  The joint spaces are preserved.  The visualized soft tissues are unremarkable.  Visualized right lung is clear.  IMPRESSION: No fracture or dislocation is seen.   Electronically Signed   By: Charline Bills M.D.   On: 01/09/2015 09:44   Dg Foot Complete Left  01/09/2015   CLINICAL DATA:  35 year old male with recurrent bilateral foot pain.  EXAM: LEFT FOOT - COMPLETE 3+ VIEW  COMPARISON:  01/05/2014.  FINDINGS: Large periarticular erosions are noted adjacent to the first MTP joint, and at the first interphalangeal joint along the medial margin in both locations. These have erosions have sclerotic margins and overhanging edges, and there is adjacent soft tissue prominence, suggestive of gouty erosions. No adjacent soft tissue calcification is noted. No acute displaced fracture, subluxation or dislocation. Small heterotopic ossification adjacent to the plantar aspect of the calcaneus, likely ossification within the plantar fascia.  IMPRESSION: 1. Findings, as above, suggestive of underlying gout. Clinical correlation is recommended.   Electronically Signed   By: Trudie Reed M.D.   On: 01/09/2015 09:46   Dg Foot Complete Right  01/09/2015   CLINICAL DATA:  Recurring bilateral foot pain  EXAM: RIGHT FOOT COMPLETE - 3+ VIEW  COMPARISON:  None.  FINDINGS: No fracture or dislocation is seen.  Marginal erosion at the medial  aspect of the 1st IP joint, involving the proximal phalanx.  The visualized soft tissues are unremarkable.  IMPRESSION: Marginal erosion at the medial aspect of the 1st IP joint. Correlate for inflammatory arthropathy such as gout.  Otherwise, no acute osseus abnormality is seen.   Electronically Signed   By: Charline Bills M.D.   On: 01/09/2015 09:44    Scheduled Meds: . enoxaparin (LOVENOX) injection  40 mg Subcutaneous Daily  . Influenza vac split quadrivalent PF  0.5 mL Intramuscular Tomorrow-1000  . insulin aspart  0-15 Units Subcutaneous TID WC  . insulin aspart  0-5 Units Subcutaneous QHS  . insulin aspart protamine- aspart  20 Units Subcutaneous BID WC  . metoprolol tartrate  25 mg Oral BID  . nicotine  7 mg Transdermal Daily  . piperacillin-tazobactam (ZOSYN)  IV  3.375 g  Intravenous Q8H  . potassium chloride  40 mEq Oral Once  . sodium chloride  500 mL Intravenous Once  . vancomycin  1,000 mg Intravenous Q8H   Continuous Infusions: . 0.9 % NaCl with KCl 40 mEq / L      Active Problems:   Tobacco abuse   Noncompliance   Dehydration   Diarrhea   Diabetes type 2, uncontrolled   Hyperglycemia   Uncontrolled hypertension   Diabetic foot ulcers   Pain    Time spent: 40 minutes   [facility]  [facility] Pager [phone]. If 7PM-7AM, please contact night-coverage at [email or web address], password [facility] 01/10/2015, 10:51 AM  LOS: 1 day

## 2015-01-11 ENCOUNTER — Inpatient Hospital Stay (HOSPITAL_COMMUNITY): Payer: Medicaid Other

## 2015-01-11 DIAGNOSIS — M25519 Pain in unspecified shoulder: Secondary | ICD-10-CM | POA: Diagnosis present

## 2015-01-11 DIAGNOSIS — R7881 Bacteremia: Secondary | ICD-10-CM

## 2015-01-11 DIAGNOSIS — M25551 Pain in right hip: Secondary | ICD-10-CM

## 2015-01-11 DIAGNOSIS — L97409 Non-pressure chronic ulcer of unspecified heel and midfoot with unspecified severity: Secondary | ICD-10-CM

## 2015-01-11 DIAGNOSIS — M79659 Pain in unspecified thigh: Secondary | ICD-10-CM | POA: Diagnosis present

## 2015-01-11 DIAGNOSIS — E11621 Type 2 diabetes mellitus with foot ulcer: Secondary | ICD-10-CM

## 2015-01-11 DIAGNOSIS — M869 Osteomyelitis, unspecified: Secondary | ICD-10-CM | POA: Insufficient documentation

## 2015-01-11 DIAGNOSIS — L97529 Non-pressure chronic ulcer of other part of left foot with unspecified severity: Secondary | ICD-10-CM

## 2015-01-11 DIAGNOSIS — A419 Sepsis, unspecified organism: Secondary | ICD-10-CM | POA: Diagnosis present

## 2015-01-11 DIAGNOSIS — F17200 Nicotine dependence, unspecified, uncomplicated: Secondary | ICD-10-CM | POA: Diagnosis present

## 2015-01-11 DIAGNOSIS — R6521 Severe sepsis with septic shock: Secondary | ICD-10-CM

## 2015-01-11 DIAGNOSIS — R7989 Other specified abnormal findings of blood chemistry: Secondary | ICD-10-CM

## 2015-01-11 DIAGNOSIS — L97519 Non-pressure chronic ulcer of other part of right foot with unspecified severity: Secondary | ICD-10-CM

## 2015-01-11 DIAGNOSIS — A4101 Sepsis due to Methicillin susceptible Staphylococcus aureus: Secondary | ICD-10-CM | POA: Diagnosis present

## 2015-01-11 DIAGNOSIS — I517 Cardiomegaly: Secondary | ICD-10-CM

## 2015-01-11 DIAGNOSIS — R778 Other specified abnormalities of plasma proteins: Secondary | ICD-10-CM | POA: Insufficient documentation

## 2015-01-11 DIAGNOSIS — B9689 Other specified bacterial agents as the cause of diseases classified elsewhere: Secondary | ICD-10-CM

## 2015-01-11 DIAGNOSIS — M79606 Pain in leg, unspecified: Secondary | ICD-10-CM | POA: Diagnosis present

## 2015-01-11 DIAGNOSIS — M25511 Pain in right shoulder: Secondary | ICD-10-CM

## 2015-01-11 LAB — COMPREHENSIVE METABOLIC PANEL
ALBUMIN: 1.7 g/dL — AB (ref 3.5–5.2)
ALT: 23 U/L (ref 0–53)
ANION GAP: 10 (ref 5–15)
AST: 20 U/L (ref 0–37)
Alkaline Phosphatase: 80 U/L (ref 39–117)
BUN: 29 mg/dL — AB (ref 6–23)
CO2: 21 mmol/L (ref 19–32)
CREATININE: 2.52 mg/dL — AB (ref 0.50–1.35)
Calcium: 8 mg/dL — ABNORMAL LOW (ref 8.4–10.5)
Chloride: 100 mEq/L (ref 96–112)
GFR calc Af Amer: 37 mL/min — ABNORMAL LOW (ref >90)
GFR calc non Af Amer: 32 mL/min — ABNORMAL LOW (ref >90)
Glucose, Bld: 172 mg/dL — ABNORMAL HIGH (ref 70–99)
Potassium: 3.6 mmol/L (ref 3.5–5.1)
Sodium: 131 mmol/L — ABNORMAL LOW (ref 135–145)
TOTAL PROTEIN: 6.3 g/dL (ref 6.0–8.3)
Total Bilirubin: 0.6 mg/dL (ref 0.3–1.2)

## 2015-01-11 LAB — RESPIRATORY VIRUS PANEL
Adenovirus: NOT DETECTED
INFLUENZA A H1: NOT DETECTED
INFLUENZA A H3: NOT DETECTED
INFLUENZA B 1: NOT DETECTED
Influenza A: NOT DETECTED
Metapneumovirus: NOT DETECTED
PARAINFLUENZA 3 A: NOT DETECTED
Parainfluenza 1: NOT DETECTED
Parainfluenza 2: NOT DETECTED
RESPIRATORY SYNCYTIAL VIRUS A: NOT DETECTED
RHINOVIRUS: NOT DETECTED
Respiratory Syncytial Virus B: NOT DETECTED

## 2015-01-11 LAB — GLUCOSE, CAPILLARY
GLUCOSE-CAPILLARY: 85 mg/dL (ref 70–99)
Glucose-Capillary: 155 mg/dL — ABNORMAL HIGH (ref 70–99)
Glucose-Capillary: 91 mg/dL (ref 70–99)
Glucose-Capillary: 160 mg/dL — ABNORMAL HIGH (ref 70–99)

## 2015-01-11 LAB — CBC
HCT: 34.8 % — ABNORMAL LOW (ref 39.0–52.0)
HEMOGLOBIN: 12.1 g/dL — AB (ref 13.0–17.0)
MCH: 28.3 pg (ref 26.0–34.0)
MCHC: 34.8 g/dL (ref 30.0–36.0)
MCV: 81.3 fL (ref 78.0–100.0)
Platelets: 153 10*3/uL (ref 150–400)
RBC: 4.28 MIL/uL (ref 4.22–5.81)
RDW: 13.2 % (ref 11.5–15.5)
WBC: 15.3 10*3/uL — ABNORMAL HIGH (ref 4.0–10.5)

## 2015-01-11 LAB — SEDIMENTATION RATE: Sed Rate: 113 mm/hr — ABNORMAL HIGH (ref 0–16)

## 2015-01-11 LAB — TROPONIN I: Troponin I: 0.28 ng/mL — ABNORMAL HIGH (ref <0.031)

## 2015-01-11 LAB — MAGNESIUM: Magnesium: 1.9 mg/dL (ref 1.5–2.5)

## 2015-01-11 MED ORDER — BACITRACIN-NEOMYCIN-POLYMYXIN OINTMENT TUBE
TOPICAL_OINTMENT | Freq: Every day | CUTANEOUS | Status: DC
Start: 2015-01-11 — End: 2015-01-28
  Administered 2015-01-11 – 2015-01-28 (×14): via TOPICAL
  Filled 2015-01-11: qty 15
  Filled 2015-01-11: qty 1

## 2015-01-11 MED ORDER — SODIUM CHLORIDE 0.9 % IV SOLN
INTRAVENOUS | Status: DC
  Administered 2015-01-11: 13:00:00 via INTRAVENOUS

## 2015-01-11 MED ORDER — SODIUM CHLORIDE 0.9 % IV SOLN
600.0000 mg | INTRAVENOUS | Status: DC
  Administered 2015-01-11 – 2015-01-12 (×2): 600 mg via INTRAVENOUS
  Filled 2015-01-11 (×3): qty 12

## 2015-01-11 MED ORDER — BOOST / RESOURCE BREEZE PO LIQD
1.0000 | Freq: Two times a day (BID) | ORAL | Status: DC
  Administered 2015-01-12 (×2): 1 via ORAL

## 2015-01-11 NOTE — Consult Note (Signed)
CONSULT NOTE  Date: 01/11/2015               Patient Name:  Dwayne [name] MRN: [id number]  DOB: 1980-09-10 Age / Sex: 35 y.o., male        PCP: No PCP Per Patient Primary Cardiologist: [place], [place]            Referring Physician: Susie Cassette              Reason for Consult: Elevated Troponin           History of Present Illness: Patient is a 35 y.o. male with a PMHx of uncontrolled hypertension, type 2 diabetes mellitus, who was admitted to [facility] on 01/09/2015 for evaluation of diarrhea, fevers, chills for the past 24 hours. A troponin was checked and found to be elevated. We are asked to comment on this elevated troponin.  The patient has had fevers and chills and generalized weakness for the past day or so. He's had diarrhea.  He admits to being noncompliant with his medications.  Very difficult historian.     He has + blood cultures - [facility] in clusters.  . Has had right shoulder pain for the past several days. ? Septic joint.   deneis any chest pain or dyspnea Does not get any exercise    Medications: Outpatient medications: Prescriptions prior to admission  Medication Sig Dispense Refill Last Dose  . oxyCODONE (OXY IR/ROXICODONE) 5 MG immediate release tablet Take 1 tablet (5 mg total) by mouth every 4 (four) hours as needed for moderate pain or severe pain. 90 tablet 0 unknown  . ciprofloxacin (CIPRO) 500 MG tablet Take 1 tablet (500 mg total) by mouth 2 (two) times daily. X 2 weeks (Patient not taking: Reported on 01/09/2015) 28 tablet 0 Taking  . doxycycline (VIBRA-TABS) 100 MG tablet Take 1 tablet (100 mg total) by mouth 2 (two) times daily. X 2 weeks (Patient not taking: Reported on 01/09/2015) 28 tablet 0 Taking  . furosemide (LASIX) 20 MG tablet Take 1 tablet (20 mg total) by mouth daily. (Patient not taking: Reported on 01/09/2015) 30 tablet 3   . insulin aspart protamine- aspart (NOVOLOG MIX 70/30) (70-30) 100 UNIT/ML injection Inject 0.25 mLs (25 Units total)  into the skin 2 (two) times daily with a meal. (Patient not taking: Reported on 01/09/2015) 3 vial 4   . Insulin Syringe-Needle U-100 28G X 1/2" 0.5 ML MISC Use as directed. Any generic available. (Patient not taking: Reported on 01/09/2015) 100 each 12   . Lancets (FREESTYLE) lancets Any generic available. Use as instructed (Patient not taking: Reported on 01/09/2015) 100 each 12   . lisinopril (PRINIVIL,ZESTRIL) 20 MG tablet Take 1 tablet (20 mg total) by mouth daily. (Patient not taking: Reported on 01/09/2015) 30 tablet 3   . metFORMIN (GLUCOPHAGE) 500 MG tablet Take 1 tablet (500 mg total) by mouth 2 (two) times daily with a meal. (Patient not taking: Reported on 01/09/2015) 60 tablet 3     Current medications: Current Facility-Administered Medications  Medication Dose Route Frequency Provider Last Rate Last Dose  . 0.9 % NaCl with KCl 40 mEq / L  infusion   Intravenous Continuous Richarda Overlie, MD 100 mL/hr at 01/10/15 1245 100 mL/hr at 01/10/15 1245  . acetaminophen (TYLENOL) tablet 650 mg  650 mg Oral Q6H PRN Ron Parker, MD   650 mg at 01/11/15 0825   Or  . acetaminophen (TYLENOL) suppository 650 mg  650 mg Rectal  Q6H PRN Ron ParkerHarvette C Jenkins, MD      . alum & mag hydroxide-simeth (MAALOX/MYLANTA) 200-200-20 MG/5ML suspension 30 mL  30 mL Oral Q6H PRN Ron ParkerHarvette C Jenkins, MD      . DAPTOmycin (CUBICIN) 600 mg in sodium chloride 0.9 % IVPB  600 mg Intravenous Q24H Herby AbrahamMichelle T Bell, RPH      . enoxaparin (LOVENOX) injection 40 mg  40 mg Subcutaneous Daily Ron ParkerHarvette C Jenkins, MD   40 mg at 01/10/15 1008  . hydrALAZINE (APRESOLINE) injection 10 mg  10 mg Intravenous Q6H PRN Ron ParkerHarvette C Jenkins, MD   10 mg at 01/10/15 0654  . HYDROmorphone (DILAUDID) injection 1 mg  1 mg Intravenous Q3H PRN Ripudeep Jenna LuoK Rai, MD   1 mg at 01/11/15 0728  . Influenza vac split quadrivalent PF (FLUARIX) injection 0.5 mL  0.5 mL Intramuscular Tomorrow-1000 Ripudeep K Rai, MD   0.5 mL at 01/10/15 1000  . insulin aspart  (novoLOG) injection 0-15 Units  0-15 Units Subcutaneous TID WC Ripudeep Jenna LuoK Rai, MD   3 Units at 01/11/15 0723  . insulin aspart (novoLOG) injection 0-5 Units  0-5 Units Subcutaneous QHS Ripudeep Jenna LuoK Rai, MD   2 Units at 01/10/15 2208  . insulin aspart protamine- aspart (NOVOLOG MIX 70/30) injection 20 Units  20 Units Subcutaneous BID WC Ripudeep Jenna LuoK Rai, MD   20 Units at 01/11/15 0723  . living well with diabetes book MISC   Does not apply Once Richarda OverlieNayana Abrol, MD      . metoprolol tartrate (LOPRESSOR) tablet 25 mg  25 mg Oral BID Ripudeep Jenna LuoK Rai, MD   25 mg at 01/10/15 2158  . nicotine (NICODERM CQ - dosed in mg/24 hr) patch 7 mg  7 mg Transdermal Daily Ron ParkerHarvette C Jenkins, MD   7 mg at 01/09/15 0855  . ondansetron (ZOFRAN) tablet 4 mg  4 mg Oral Q6H PRN Ron ParkerHarvette C Jenkins, MD   4 mg at 01/10/15 0012   Or  . ondansetron (ZOFRAN) injection 4 mg  4 mg Intravenous Q6H PRN Ron ParkerHarvette C Jenkins, MD   4 mg at 01/11/15 0721  . oxyCODONE (Oxy IR/ROXICODONE) immediate release tablet 5 mg  5 mg Oral Q4H PRN Ripudeep Jenna LuoK Rai, MD   5 mg at 01/10/15 1655  . sodium chloride 0.9 % bolus 500 mL  500 mL Intravenous Once Richarda OverlieNayana Abrol, MD         No Known Allergies   Past Medical History  Diagnosis Date  . Diabetes mellitus   . Hypertension     Past Surgical History  Procedure Laterality Date  . I&d extremity Bilateral 01/16/2014    Procedure:  DEBRIDEMENT of bilateral foot ulcers;  Surgeon: Kathryne Hitchhristopher Y Blackman, MD;  [facility];  Service: Orthopedics;  Laterality: Bilateral;    History reviewed. No pertinent family history.  Social History:  reports that he has been smoking Cigars.  He has never used smokeless tobacco. He reports that he drinks alcohol. He reports that he does not use illicit drugs.   Review of Systems: Constitutional:  denies fever, chills, diaphoresis, appetite change and fatigue.  HEENT: denies photophobia, eye pain, redness, hearing loss, ear pain, congestion, sore throat, rhinorrhea,  sneezing, neck pain, neck stiffness and tinnitus.  Respiratory: denies SOB, DOE, cough, chest tightness, and wheezing.  Cardiovascular: denies chest pain, palpitations and leg swelling.  Gastrointestinal: denies nausea, vomiting, abdominal pain, diarrhea, constipation, blood in stool.  Genitourinary: denies dysuria, urgency, frequency, hematuria, flank pain and difficulty urinating.  Musculoskeletal: denies  myalgias, back pain, joint swelling, arthralgias and gait problem.   Skin: denies pallor, rash and wound.  Neurological: denies dizziness, seizures, syncope, weakness, light-headedness, numbness and headaches.   Hematological: denies adenopathy, easy bruising, personal or family bleeding history.  Psychiatric/ Behavioral: denies suicidal ideation, mood changes, confusion, nervousness, sleep disturbance and agitation.    Physical Exam: BP 110/62 mmHg  Pulse 109  Temp(Src) 100 F (37.8 C) (Oral)  Resp 17  Ht 6\' 2"  (1.88 m)  Wt 226 lb 6.4 oz (102.694 kg)  BMI 29.06 kg/m2  SpO2 100%  Wt Readings from Last 3 Encounters:  01/09/15 226 lb 6.4 oz (102.694 kg)  01/28/14 222 lb (100.699 kg)  01/16/14 222 lb 1.6 oz (100.744 kg)    General: Vital signs reviewed and noted. Slow in answering questions  Head: Normocephalic, atraumatic, sclera anicteric,   Neck: Supple. Negative for carotid bruits. No JVD   Lungs:  Clear bilaterally, no  wheezes, rales, or rhonchi. Breathing is normal   Heart: RRR with S1 S2. No murmurs, rubs, or gallops   Abdomen:  Soft, non-tender, non-distended with normoactive bowel sounds. No hepatomegaly. No rebound/guarding. No obvious abdominal masses   MSK: Strength and the appear normal for age.   Extremities: No clubbing or cyanosis. No edema.  Distal pedal pulses are 2+ and equal   Neurologic: Alert and oriented X 3. Moves all extremities spontaneously.  Psych: Slow to respond to questions     Lab results: Basic Metabolic Panel:  Recent Labs Lab  01/09/15 0500 01/10/15 0508 01/10/15 1121 01/11/15 0235  NA 136 132*  --  131*  K 3.0* 3.0*  --  3.6  CL 105 103  --  100  CO2 23 23  --  21  GLUCOSE 253* 103*  --  172*  BUN 17 21  --  29*  CREATININE 1.86* 2.15*  --  2.52*  CALCIUM 7.7* 8.0*  --  8.0*  MG 1.5  --  1.6  --     Liver Function Tests:  Recent Labs Lab 01/09/15 0021 01/11/15 0235  AST 25 20  ALT 19 23  ALKPHOS 86 80  BILITOT 0.6 0.6  PROT 6.0 6.3  ALBUMIN 2.1* 1.7*   No results for input(s): LIPASE, AMYLASE in the last 168 hours. No results for input(s): AMMONIA in the last 168 hours.  CBC:  Recent Labs Lab 01/09/15 0021 01/09/15 0500 01/10/15 0508 01/11/15 0235  WBC 11.3* 10.5 13.8* 15.3*  NEUTROABS 9.3*  --   --   --   HGB 11.7* 10.6* 12.2* 12.1*  HCT 34.7* 31.8* 35.1* 34.8*  MCV 82.6 82.4 81.6 81.3  PLT 179 141* 126* 153    Cardiac Enzymes:  Recent Labs Lab 01/10/15 1517 01/10/15 2036 01/11/15 0235  TROPONINI 0.36* 0.33* 0.28*    BNP: Invalid input(s): POCBNP  CBG:  Recent Labs Lab 01/10/15 0750 01/10/15 1213 01/10/15 1608 01/10/15 2115 01/11/15 0712  GLUCAP 142* 108* 234* 212* 155*    Coagulation Studies: No results for input(s): LABPROT, INR in the last 72 hours.   Other results: EKG :    Sinus tach at 123.  No ST or T wave changes.    Imaging: Dg Chest Port 1 View  01/10/2015   CLINICAL DATA:  Fever and sweats.  EXAM: PORTABLE CHEST - 1 VIEW  COMPARISON:  None.  FINDINGS: Low lung volumes with vascular crowding and streaky subsegmental atelectasis. No obvious infiltrate or effusion. The heart is within normal  limits in size given the AP portable technique. The bony structures are grossly normal.  IMPRESSION: Low lung volumes with vascular crowding and bibasilar atelectasis.   Electronically Signed   By: Loralie Champagne M.D.   On: 01/10/2015 11:27       Assessment & Plan:  1. Elevated Troponin:  Patient has not cardiac symptoms and did not present with a  cardiac complaint.   He presented with fever, chills and diarrhea, generalized weakness.  He has now been found to have bacteremia. I suspect he was septic when he was admitted  It is not unusual to have mild Troponin elevations in the setting of bacteremia and sepsis and I suspect this is the etiology of his very minimal Troponin elevation.    Echo has been done and is pending, He has a hx of HTN but does not take his meds. He may have some LV dysfunction based on this uncontrolled HTN.  If the echo is unremarkable, I do not think he needs any additional cardiology evauation for this Troponin elevation   2. Sepsis:  He has a sore right shoulder and now is found to have staph bacteremia. Further evaluation per ID and Int. Med.      Vesta Mixer, Montez Hageman., MD, [facility] 01/11/2015, 10:37 AM Office - [phone] Pager [phone]

## 2015-01-11 NOTE — Consult Note (Addendum)
[facility] for Infectious Disease    Date of Admission:  01/09/2015  Date of Consult:  01/11/2015  Reason for Consult: Gram-positive bacteremia in a patient with diabetic foot ulcers and right shoulder pain and bilateral thigh pain Referring Physician: Dr. Allyson Sabal   HPI: Dwayne [name] is an 35 y.o. male with diabetes mellitus with peripheral neuropathy and diabetic foot ulcer history, smoking, had prior debridement of bilateral foot ulcers by Dr. Ninfa Linden in January 2015 who presents to [place] with acute onset of severe fevers weakness diarrhea and fatigue. He also had had right-sided shoulder pain. He had plain films done of the shoulder but no aggressive imaging such as an MRI and plain films fail to show any evidence of a fracture. Plain films of the feet were read as potentially showing changes consistent with gout with some erosions as MTP and other joints but he again needs more aggressive imaging done of these sites.  Additionally he has noted pain in both thighs the right thigh which is subsided in the left thigh which is still present with some tenderness in the medial aspect of the left thigh.  He has had blood cultures done on admission and these are growing gram-positive cocci in clusters in 2 out of 2 blood cultures.  He is in renal failure, and with positive troponins from his sepsis which is undoubtedly due to Staphylococcus aureus  He recounts history of having had an abscess over his buttocks that was I&D as well as abscess behind his neck likely both due to Staphylococcus aureus.     Past Medical History  Diagnosis Date  . Diabetes mellitus   . Hypertension     Past Surgical History  Procedure Laterality Date  . I&d extremity Bilateral 01/16/2014    Procedure:  DEBRIDEMENT of bilateral foot ulcers;  Surgeon: [name], MD;  Location: [facility];  Service: Orthopedics;  Laterality: Bilateral;  ergies:   No Known  Allergies   Medications: I have reviewed patients current medications as documented in Epic Anti-infectives    Start     Dose/Rate Route Frequency Ordered Stop   01/11/15 1200  DAPTOmycin (CUBICIN) 600 mg in sodium chloride 0.9 % IVPB     600 mg224 mL/hr over 30 Minutes Intravenous Every 24 hours 01/11/15 1003     01/10/15 1200  vancomycin (VANCOCIN) IVPB 1000 mg/200 mL premix  Status:  Discontinued     1,000 mg200 mL/hr over 60 Minutes Intravenous Every 8 hours 01/10/15 1044 01/11/15 0947   01/10/15 1100  piperacillin-tazobactam (ZOSYN) IVPB 3.375 g  Status:  Discontinued     3.375 g12.5 mL/hr over 240 Minutes Intravenous Every 8 hours 01/10/15 1043 01/11/15 0902   01/09/15 0700  metroNIDAZOLE (FLAGYL) IVPB 500 mg  Status:  Discontinued     500 mg100 mL/hr over 60 Minutes Intravenous Every 8 hours 01/09/15 0651 01/10/15 1018      Social History:  reports that he has been smoking Cigars.  He has never used smokeless tobacco. He reports that he drinks alcohol. He reports that he does not use illicit drugs.  History reviewed. No pertinent family history.  As in HPI and primary teams notes otherwise 12 point review of systems is negative  Blood pressure 110/62, pulse 109, temperature 100 F (37.8 C), temperature source Oral, resp. rate 17, height 6' 2"  (1.88 m), weight 226 lb 6.4 oz (102.694 kg), SpO2 100 %. General: Alert and awake, oriented x3, not in any acute distress. HEENT:  anicteric sclera, pupils reactive to light and accommodation, EOMI, oropharynx clear and without exudate CVS tachycardic rate, normal r,  no murmur rubs or gallops Chest: clear to auscultation bilaterally, no wheezing, rales or rhonchi Abdomen: soft nontender, nondistended, normal bowel sounds, Extremities: Right shoulder has limited range of motion and and has tenderness to palpation over the [facility] joint. His inner left thigh is exquisitely tender to palpation He has bilateral DFU from sites of prior debridment  see pics  01/11/15:            Neuro: nonfocal, other than diminised sensation in feet   Results for orders placed or performed during the hospital encounter of 01/09/15 (from the past 48 hour(s))  Influenza panel by PCR (type A & B, H1N1)     Status: None   Collection Time: 01/09/15  2:46 PM  Result Value Ref Range   Influenza A By PCR NEGATIVE NEGATIVE   Influenza B By PCR NEGATIVE NEGATIVE   H1N1 flu by pcr NOT DETECTED NOT DETECTED    Comment:        The Xpert Flu assay (FDA approved for nasal aspirates or washes and nasopharyngeal swab specimens), is intended as an aid in the diagnosis of influenza and should not be used as a sole basis for treatment.   Glucose, capillary     Status: None   Collection Time: 01/09/15  4:49 PM  Result Value Ref Range   Glucose-Capillary 96 70 - 99 mg/dL  Glucose, capillary     Status: Abnormal   Collection Time: 01/09/15 10:11 PM  Result Value Ref Range   Glucose-Capillary 65 (L) 70 - 99 mg/dL  Glucose, capillary     Status: Abnormal   Collection Time: 01/09/15 10:58 PM  Result Value Ref Range   Glucose-Capillary 145 (H) 70 - 99 mg/dL  Glucose, capillary     Status: None   Collection Time: 01/10/15 12:54 AM  Result Value Ref Range   Glucose-Capillary 99 70 - 99 mg/dL  Lactic acid, plasma     Status: None   Collection Time: 01/10/15  4:35 AM  Result Value Ref Range   Lactic Acid, Venous 0.8 0.5 - 2.2 mmol/L  Culture, blood (routine x 2)     Status: None (Preliminary result)   Collection Time: 01/10/15  4:35 AM  Result Value Ref Range   Specimen Description BLOOD RIGHT HAND    Special Requests BOTTLES DRAWN AEROBIC AND ANAEROBIC 10CC EA    Culture      GRAM POSITIVE COCCI IN CLUSTERS Note: Culture results may be compromised due to an excessive volume of blood received in culture bottles. Performed at [facility]    Report Status PENDING   Glucose, capillary     Status: None   Collection Time: 01/10/15  4:35 AM   Result Value Ref Range   Glucose-Capillary 85 70 - 99 mg/dL  Culture, blood (routine x 2)     Status: None (Preliminary result)   Collection Time: 01/10/15  4:46 AM  Result Value Ref Range   Specimen Description BLOOD LEFT ARM    Special Requests BOTTLES DRAWN AEROBIC AND ANAEROBIC 10CC EA    Culture      GRAM POSITIVE COCCI IN CLUSTERS Note: Culture results may be compromised due to an excessive volume of blood received in culture bottles. Performed at [facility]    Report Status PENDING   CBC     Status: Abnormal   Collection Time: 01/10/15  5:08  AM  Result Value Ref Range   WBC 13.8 (H) 4.0 - 10.5 K/uL   RBC 4.30 4.22 - 5.81 MIL/uL   Hemoglobin 12.2 (L) 13.0 - 17.0 g/dL   HCT 35.1 (L) 39.0 - 52.0 %   MCV 81.6 78.0 - 100.0 fL   MCH 28.4 26.0 - 34.0 pg   MCHC 34.8 30.0 - 36.0 g/dL   RDW 13.0 11.5 - 15.5 %   Platelets 126 (L) 150 - 400 K/uL  Basic metabolic panel     Status: Abnormal   Collection Time: 01/10/15  5:08 AM  Result Value Ref Range   Sodium 132 (L) 135 - 145 mmol/L    Comment: Please note change in reference range.   Potassium 3.0 (L) 3.5 - 5.1 mmol/L    Comment: Please note change in reference range.   Chloride 103 96 - 112 mEq/L   CO2 23 19 - 32 mmol/L   Glucose, Bld 103 (H) 70 - 99 mg/dL   BUN 21 6 - 23 mg/dL   Creatinine, Ser 2.15 (H) 0.50 - 1.35 mg/dL   Calcium 8.0 (L) 8.4 - 10.5 mg/dL   GFR calc non Af Amer 38 (L) >90 mL/min   GFR calc Af Amer 44 (L) >90 mL/min    Comment: (NOTE) The eGFR has been calculated using the CKD EPI equation. This calculation has not been validated in all clinical situations. eGFR's persistently <90 mL/min signify possible Chronic Kidney Disease.    Anion gap 6 5 - 15  Glucose, capillary     Status: Abnormal   Collection Time: 01/10/15  7:50 AM  Result Value Ref Range   Glucose-Capillary 142 (H) 70 - 99 mg/dL  Culture, blood (routine x 2)     Status: None (Preliminary result)   Collection Time: 01/10/15 11:19  AM  Result Value Ref Range   Specimen Description BLOOD LEFT FOREARM    Special Requests BOTTLES DRAWN AEROBIC ONLY 5CC    Culture      GRAM POSITIVE COCCI IN CLUSTERS Note: Gram Stain Report Called to,Read Back By and Verified With: NURSE MONICA YIM 01/11/15 6:05AM [place] Performed at [facility]    Report Status PENDING   Magnesium     Status: None   Collection Time: 01/10/15 11:21 AM  Result Value Ref Range   Magnesium 1.6 1.5 - 2.5 mg/dL  Culture, blood (routine x 2)     Status: None (Preliminary result)   Collection Time: 01/10/15 11:27 AM  Result Value Ref Range   Specimen Description BLOOD LEFT ANTECUBITAL    Special Requests BOTTLES DRAWN AEROBIC ONLY [place]    Culture      GRAM POSITIVE COCCI IN CLUSTERS Note: Gram Stain Report Called to,Read Back By and Verified With: NURSE MONICA YIM 01/11/15 6:05AM [place] Performed at [facility]    Report Status PENDING   Glucose, capillary     Status: Abnormal   Collection Time: 01/10/15 12:13 PM  Result Value Ref Range   Glucose-Capillary 108 (H) 70 - 99 mg/dL   Comment 1 Notify RN   Troponin I     Status: Abnormal   Collection Time: 01/10/15  3:17 PM  Result Value Ref Range   Troponin I 0.36 (H) <0.031 ng/mL    Comment:        PERSISTENTLY INCREASED TROPONIN VALUES IN THE RANGE OF 0.04-0.49 ng/mL CAN BE SEEN IN:       -UNSTABLE ANGINA       -CONGESTIVE HEART  FAILURE       -MYOCARDITIS       -CHEST TRAUMA       -ARRYHTHMIAS       -LATE PRESENTING MYOCARDIAL INFARCTION       -COPD   CLINICAL FOLLOW-UP RECOMMENDED. Please note change in reference range.   Glucose, capillary     Status: Abnormal   Collection Time: 01/10/15  4:08 PM  Result Value Ref Range   Glucose-Capillary 234 (H) 70 - 99 mg/dL  Troponin I     Status: Abnormal   Collection Time: 01/10/15  8:36 PM  Result Value Ref Range   Troponin I 0.33 (H) <0.031 ng/mL    Comment:        PERSISTENTLY INCREASED TROPONIN VALUES IN THE RANGE OF  0.04-0.49 ng/mL CAN BE SEEN IN:       -UNSTABLE ANGINA       -CONGESTIVE HEART FAILURE       -MYOCARDITIS       -CHEST TRAUMA       -ARRYHTHMIAS       -LATE PRESENTING MYOCARDIAL INFARCTION       -COPD   CLINICAL FOLLOW-UP RECOMMENDED. Please note change in reference range.   Glucose, capillary     Status: Abnormal   Collection Time: 01/10/15  9:15 PM  Result Value Ref Range   Glucose-Capillary 212 (H) 70 - 99 mg/dL  Troponin I     Status: Abnormal   Collection Time: 01/11/15  2:35 AM  Result Value Ref Range   Troponin I 0.28 (H) <0.031 ng/mL    Comment:        PERSISTENTLY INCREASED TROPONIN VALUES IN THE RANGE OF 0.04-0.49 ng/mL CAN BE SEEN IN:       -UNSTABLE ANGINA       -CONGESTIVE HEART FAILURE       -MYOCARDITIS       -CHEST TRAUMA       -ARRYHTHMIAS       -LATE PRESENTING MYOCARDIAL INFARCTION       -COPD   CLINICAL FOLLOW-UP RECOMMENDED. Please note change in reference range.   CBC     Status: Abnormal   Collection Time: 01/11/15  2:35 AM  Result Value Ref Range   WBC 15.3 (H) 4.0 - 10.5 K/uL   RBC 4.28 4.22 - 5.81 MIL/uL   Hemoglobin 12.1 (L) 13.0 - 17.0 g/dL   HCT 34.8 (L) 39.0 - 52.0 %   MCV 81.3 78.0 - 100.0 fL   MCH 28.3 26.0 - 34.0 pg   MCHC 34.8 30.0 - 36.0 g/dL   RDW 13.2 11.5 - 15.5 %   Platelets 153 150 - 400 K/uL  Comprehensive metabolic panel     Status: Abnormal   Collection Time: 01/11/15  2:35 AM  Result Value Ref Range   Sodium 131 (L) 135 - 145 mmol/L    Comment: Please note change in reference range.   Potassium 3.6 3.5 - 5.1 mmol/L    Comment: Please note change in reference range.   Chloride 100 96 - 112 mEq/L   CO2 21 19 - 32 mmol/L   Glucose, Bld 172 (H) 70 - 99 mg/dL   BUN 29 (H) 6 - 23 mg/dL   Creatinine, Ser 2.52 (H) 0.50 - 1.35 mg/dL   Calcium 8.0 (L) 8.4 - 10.5 mg/dL   Total Protein 6.3 6.0 - 8.3 g/dL   Albumin 1.7 (L) 3.5 - 5.2 g/dL   AST 20 0 - 37 U/L   ALT  23 0 - 53 U/L   Alkaline Phosphatase 80 39 - 117 U/L    Total Bilirubin 0.6 0.3 - 1.2 mg/dL   GFR calc non Af Amer 32 (L) >90 mL/min   GFR calc Af Amer 37 (L) >90 mL/min    Comment: (NOTE) The eGFR has been calculated using the CKD EPI equation. This calculation has not been validated in all clinical situations. eGFR's persistently <90 mL/min signify possible Chronic Kidney Disease.    Anion gap 10 5 - 15  Glucose, capillary     Status: Abnormal   Collection Time: 01/11/15  7:12 AM  Result Value Ref Range   Glucose-Capillary 155 (H) 70 - 99 mg/dL  Magnesium     Status: None   Collection Time: 01/11/15  9:53 AM  Result Value Ref Range   Magnesium 1.9 1.5 - 2.5 mg/dL  Glucose, capillary     Status: None   Collection Time: 01/11/15 12:49 PM  Result Value Ref Range   Glucose-Capillary 91 70 - 99 mg/dL   @BRIEFLABTABLE (sdes,specrequest,cult,reptstatus)   ) Recent Results (from the past 720 hour(s))  Urine culture     Status: None   Collection Time: 01/09/15  1:31 AM  Result Value Ref Range Status   Specimen Description URINE, CLEAN CATCH  Final   Special Requests NONE  Final   Colony Count   Final    3,000 COLONIES/ML Performed at [facility]    Culture   Final    INSIGNIFICANT GROWTH Performed at [facility]    Report Status 01/10/2015 FINAL  Final  Stool culture     Status: None (Preliminary result)   Collection Time: 01/09/15  4:09 AM  Result Value Ref Range Status   Specimen Description VAGINAL/RECTAL  Final   Special Requests Normal  Final   Culture   Final    NO SUSPICIOUS COLONIES, CONTINUING TO HOLD Performed at [facility]    Report Status PENDING  Incomplete  Clostridium Difficile by PCR     Status: None   Collection Time: 01/09/15  4:09 AM  Result Value Ref Range Status   C difficile by pcr NEGATIVE NEGATIVE Final  Culture, blood (routine x 2)     Status: None (Preliminary result)   Collection Time: 01/10/15  4:35 AM  Result Value Ref Range Status   Specimen Description BLOOD  RIGHT HAND  Final   Special Requests BOTTLES DRAWN AEROBIC AND ANAEROBIC 10CC EA  Final   Culture   Final    GRAM POSITIVE COCCI IN CLUSTERS Note: Culture results may be compromised due to an excessive volume of blood received in culture bottles. Performed at [facility]    Report Status PENDING  Incomplete  Culture, blood (routine x 2)     Status: None (Preliminary result)   Collection Time: 01/10/15  4:46 AM  Result Value Ref Range Status   Specimen Description BLOOD LEFT ARM  Final   Special Requests BOTTLES DRAWN AEROBIC AND ANAEROBIC 10CC EA  Final   Culture   Final    GRAM POSITIVE COCCI IN CLUSTERS Note: Culture results may be compromised due to an excessive volume of blood received in culture bottles. Performed at [facility]    Report Status PENDING  Incomplete  Culture, blood (routine x 2)     Status: None (Preliminary result)   Collection Time: 01/10/15 11:19 AM  Result Value Ref Range Status   Specimen Description BLOOD LEFT FOREARM  Final  Special Requests BOTTLES DRAWN AEROBIC ONLY 5CC  Final   Culture   Final    GRAM POSITIVE COCCI IN CLUSTERS Note: Gram Stain Report Called to,Read Back By and Verified With: NURSE MONICA YIM 01/11/15 6:05AM [place] Performed at [facility]    Report Status PENDING  Incomplete  Culture, blood (routine x 2)     Status: None (Preliminary result)   Collection Time: 01/10/15 11:27 AM  Result Value Ref Range Status   Specimen Description BLOOD LEFT ANTECUBITAL  Final   Special Requests BOTTLES DRAWN AEROBIC ONLY [place]  Final   Culture   Final    GRAM POSITIVE COCCI IN CLUSTERS Note: Gram Stain Report Called to,Read Back By and Verified With: NURSE MONICA YIM 01/11/15 6:05AM [place] Performed at [facility]    Report Status PENDING  Incomplete     Impression/Recommendation  Active Problems:   Tobacco abuse   Noncompliance   Dehydration   Diarrhea   Diabetes type 2, uncontrolled   Hyperglycemia    Uncontrolled hypertension   Diabetic foot ulcers   Pain   Dwayne [name] is a 35 y.o. male with  likely staph aureus actor email with septic shock renal failure, + troponins, with mx possible sources and or metastatic sites of infection including his RIGHT SHOULDER, bilatera lFEET, left thigh   #1 Likely Staph aureus bacteremia, septic shock with septic shoulder, potential pyomyositis left thigh, potential infected DFU  --change to ancef and daptomycin  --Recheck blood cultures  --- Patient needs 2-D echocardiogram and likely a transesophageal echocardiogram to exclude endocarditis  --MRI of right shoulder to investigate for septic arthritis and osteomyelitis. He may also need orthopedic consultation for aspiration of the right shoulder for cell count differential Gram stain culture and crystals  I'm sending MRI of both feet  I think he will also need an MRI of his left thigh to ensure he does not have pyomyositis of the left thigh or that if he does that he has proper surgical attention to that area as well as  I spent greater than 60 minutes with the patient including greater than 50% of time in face to face counsel of the patient and in coordination of their care.    #2 DFU:  -- I will engage DFU focused order set     01/11/2015, 12:57 PM   Thank you so much for this interesting consult  [place] [facility] [place] [phone] (pager) [phone] (office) 01/11/2015, 12:57 PM  Rhina [place] 01/11/2015, 12:57 PM

## 2015-01-11 NOTE — Progress Notes (Signed)
  VASCULAR LAB PRELIMINARY  PRELIMINARY  PRELIMINARY  PRELIMINARY  ABI and TBI's  completed.    Preliminary report:  RT ABI: 1.15   LT ABI: 0.64 RT TBI   0.68  LT TBI  unobtainable  [place] Pullingltzroth, Shriyans Kuenzi F, RVT 01/11/2015, 6:18 PM

## 2015-01-11 NOTE — Progress Notes (Signed)
INITIAL NUTRITION ASSESSMENT  DOCUMENTATION CODES Per approved criteria  -Not Applicable   INTERVENTION: Provide Resource Breeze po BID, each supplement provides 250 kcal and 9 grams of protein.  Once diet advances, provide Glucerna Shake po BID and 30 ml Prostat once daily.  NUTRITION DIAGNOSIS: Increased nutrient needs related to wound healing as evidenced by estimated nutrition needs.   Goal: Pt to meet >/= 90% of their estimated nutrition needs   Monitor:  PO intake, weight trends, labs, I/O's  Reason for Assessment: MD consult for wound healing  35 y.o. male  Admitting Dx: Dehydration  ASSESSMENT: Pt with a history of Uncontrolled HTN, DM2 who presents to the ED with complaints of diarrhea and fevers and chills x 24 hours.Pt with Diabetic Ulcers on both feet.  Pt is currently on a clear liquid diet. Pt reports having a great appetite currently and PTA at home. Meal completion has been 100%. Weight has been stable. Pt was educated on the importance for increased calorie and protein needs to help in wound healing. Pt is agreeable to oral supplements. RD to order [employer] and pt is on a clear liquid diet. Once diet advances, will order Glucerna Shake and Prostat. Pt was encouraged to continue consuming his meals.   Pt with no observed significant fat or muscle mass loss.  Labs: Low sodium, calcium, GFR. High BUN and creatinine.  Height: Ht Readings from Last 1 Encounters:  01/09/15  (1.88 m)    Weight: Wt Readings from Last 1 Encounters:  01/09/15 226 lb 6.4 oz (102.694 kg)    Ideal Body Weight: 190 lbs  % Ideal Body Weight: 119%  Wt Readings from Last 10 Encounters:  01/09/15 226 lb 6.4 oz (102.694 kg)  01/28/14 222 lb (100.699 kg)  01/16/14 222 lb 1.6 oz (100.744 kg)  12/15/13 220 lb (99.791 kg)    Usual Body Weight: 220 lbs  % Usual Body Weight: 103%  BMI:  Body mass index is 29.06 kg/(m^2).  Estimated Nutritional Needs: Kcal:  2200-2400 Protein: 110-125 grams Fluid: 2.2 - 2.4 L/day  Skin: Diabetic pressure ulcers bilaterally  Diet Order: Diet clear liquid  EDUCATION NEEDS: -Education needs addressed   Intake/Output Summary (Last 24 hours) at 01/11/15 1511 Last data filed at 01/11/15 1300  Gross per 24 hour  Intake   2100 ml  Output    401 ml  Net   1699 ml    Last BM: 1/11  Labs:   Recent Labs Lab 01/09/15 0500 01/10/15 0508 01/10/15 1121 01/11/15 0235 01/11/15 0953  NA 136 132*  --  131*  --   K 3.0* 3.0*  --  3.6  --   CL 105 103  --  100  --   CO2 23 23  --  21  --   BUN 17 21  --  29*  --   CREATININE 1.86* 2.15*  --  2.52*  --   CALCIUM 7.7* 8.0*  --  8.0*  --   MG 1.5  --  1.6  --  1.9  GLUCOSE 253* 103*  --  172*  --     CBG (last 3)   Recent Labs  01/10/15 2115 01/11/15 0712 01/11/15 1249  GLUCAP 212* 155* 91    Scheduled Meds: . DAPTOmycin (CUBICIN)  IV  600 mg Intravenous Q24H  . enoxaparin (LOVENOX) injection  40 mg Subcutaneous Daily  . Influenza vac split quadrivalent PF  0.5 mL Intramuscular Tomorrow-1000  . insulin aspart  0-15 Units Subcutaneous TID WC  . insulin aspart  0-5 Units Subcutaneous QHS  . insulin aspart protamine- aspart  20 Units Subcutaneous BID WC  . living well with diabetes book   Does not apply Once  . metoprolol tartrate  25 mg Oral BID  . neomycin-bacitracin-polymyxin   Topical Daily  . nicotine  7 mg Transdermal Daily  . sodium chloride  500 mL Intravenous Once    Continuous Infusions: . sodium chloride 150 mL/hr at 01/11/15 1307    Past Medical History  Diagnosis Date  . Diabetes mellitus   . Hypertension     Past Surgical History  Procedure Laterality Date  . I&d extremity Bilateral 01/16/2014    Procedure:  DEBRIDEMENT of bilateral foot ulcers;  Surgeon: Kathryne Hitchhristopher Y Blackman, MD;  [facility];  Service: Orthopedics;  Laterality: Bilateral;    Marijean NiemannStephanie La, MS, RD, LDN Pager # [phone] After hours/ weekend pager  # [phone]

## 2015-01-11 NOTE — Progress Notes (Signed)
  Echocardiogram 2D Echocardiogram has been performed.  Dwayne [name] 01/11/2015, 1:24 PM

## 2015-01-11 NOTE — Progress Notes (Signed)
ANTIBIOTIC CONSULT NOTE - follow up Pharmacy Consult for:  change Vancomycin and Zosyn to daptomycin Indication: bacteremia  No Known Allergies  Patient Measurements:  Height: _0  (188 cm) Weight: 226 lb 6.4 oz (102.694 kg) IBW/kg (Calculated) : 82.2  Vital Signs: Temp: 100 F (37.8 C) (01/12 0426) Temp Source: Oral (01/12 0426) BP: 110/62 mmHg (01/12 0426) Pulse Rate: 109 (01/12 0426) Intake/Output from previous day: 01/11 0701 - 01/12 0700 In: 1950 [P.O.:1680; I.V.:640; IV Piggyback:1250] Out: 2 [Urine:2]  Labs:  Recent Labs  01/09/15 0500 01/10/15 0508 01/11/15 0235  WBC 10.5 13.8* 15.3*  HGB 10.6* 12.2* 12.1*  PLT 141* 126* 153  CREATININE 1.86* 2.15* 2.52*   Estimated Creatinine Clearance: 52.8 mL/min (by C-G formula based on Cr of 2.52).   Microbiology: Recent Results (from the past 720 hour(s))  Urine culture     Status: None   Collection Time: 01/09/15  1:31 AM  Result Value Ref Range Status   Specimen Description URINE, CLEAN CATCH  Final   Special Requests NONE  Final   Colony Count   Final    3,000 COLONIES/ML Performed at [facility]    Culture   Final    INSIGNIFICANT GROWTH Performed at [facility]    Report Status 01/10/2015 FINAL  Final  Stool culture     Status: None (Preliminary result)   Collection Time: 01/09/15  4:09 AM  Result Value Ref Range Status   Specimen Description VAGINAL/RECTAL  Final   Special Requests Normal  Final   Culture   Final    NO SUSPICIOUS COLONIES, CONTINUING TO HOLD Performed at [facility]    Report Status PENDING  Incomplete  Clostridium Difficile by PCR     Status: None   Collection Time: 01/09/15  4:09 AM  Result Value Ref Range Status   C difficile by pcr NEGATIVE NEGATIVE Final  Culture, blood (routine x 2)     Status: None (Preliminary result)   Collection Time: 01/10/15  4:35 AM  Result Value Ref Range Status   Specimen Description BLOOD RIGHT HAND  Final   Special  Requests BOTTLES DRAWN AEROBIC AND ANAEROBIC 10CC EA  Final   Culture   Final    GRAM POSITIVE COCCI IN CLUSTERS Note: Culture results may be compromised due to an excessive volume of blood received in culture bottles. Performed at [facility]    Report Status PENDING  Incomplete  Culture, blood (routine x 2)     Status: None (Preliminary result)   Collection Time: 01/10/15  4:46 AM  Result Value Ref Range Status   Specimen Description BLOOD LEFT ARM  Final   Special Requests BOTTLES DRAWN AEROBIC AND ANAEROBIC 10CC EA  Final   Culture   Final    GRAM POSITIVE COCCI IN CLUSTERS Note: Culture results may be compromised due to an excessive volume of blood received in culture bottles. Performed at [facility]    Report Status PENDING  Incomplete  Culture, blood (routine x 2)     Status: None (Preliminary result)   Collection Time: 01/10/15 11:19 AM  Result Value Ref Range Status   Specimen Description BLOOD LEFT FOREARM  Final   Special Requests BOTTLES DRAWN AEROBIC ONLY 5CC  Final   Culture   Final    GRAM POSITIVE COCCI IN CLUSTERS Note: Gram Stain Report Called to,Read Back By and Verified With: NURSE MONICA YIM 01/11/15 6:05AM [place] Performed at [facility]  Report Status PENDING  Incomplete  Culture, blood (routine x 2)     Status: None (Preliminary result)   Collection Time: 01/10/15 11:27 AM  Result Value Ref Range Status   Specimen Description BLOOD LEFT ANTECUBITAL  Final   Special Requests BOTTLES DRAWN AEROBIC ONLY Index  Final   Culture   Final    GRAM POSITIVE COCCI IN CLUSTERS Note: Gram Stain Report Called to,Read Back By and Verified With: NURSE MONICA YIM 01/11/15 6:05AM [place] Performed at [facility]    Report Status PENDING  Incomplete    Medical History: Past Medical History  Diagnosis Date  . Diabetes mellitus   . Hypertension    Assessment:  35 yr old male admitted on 01/09/14 with diarrhea, fever and chills x 24  hrs.  Flagyl given x 24hrs, stopped 1/11 with C diff negative.  Seen at wound clinic last week.  Prior on doxy + cipro.   Pharmacy consulted to switch Vanc and Zosyn to daptomycin for bacteremia. 4/4 Blood cultures on 1/11 with gram positive cocci in clusters.   Tmax 102.9 on 1/11, was 101.1last night.  ESR 104, CRP 20.0. Hx diabetic foot ulcers. Creatinine trending up 1.86>2.15>2.52.  Wt 102.7 kg; WBC up to 15.3.(on prednisone).  Vanc 1/11>>1/12 Zosyn 1/11>>1/12 Flagyl 1/10>>1/11 daptomycin 1/12>>  1/10 urine -3K insig growth 1/10 C diff neg 1/11 blood x 4 -gram positive cocci in clusters  1/11 RSV -ip  Goal of Therapy:  Eradicate bacteremia   Plan:  -daptomycin 6 mg/kg = 600 mg IV q24 -weekly creatine kinase qWednesday -f/u renal function, if worsens may need q48 hr dosing interval  -f/u MRI if Right shoulder and left and right foot  Eudelia Bunch, Pharm.D. [phone] 01/11/2015 10:19 AM

## 2015-01-11 NOTE — Progress Notes (Addendum)
[facility] PROGRESS NOTE  Dwayne [name] NWG:[id number] DOB: 1980-08-06 DOA: 01/09/2015 PCP: No PCP Per Patient  Assessment/Plan: Active Problems:   Tobacco abuse   Noncompliance   Dehydration   Diarrhea   Diabetes type 2, uncontrolled   Hyperglycemia   Uncontrolled hypertension   Diabetic foot ulcers   Pain   SIRS secondary to bacteremia with gram-positive cocci, sensitivity pending Start the patient on broad-spectrum antibiotics vancomycin and Zosyn 1/12 Now switched to daptomycin by infectious disease Dr. Algis Liming Repeat blood cultures, lactic acid normal Suspect viral URI but influenza PCR negative Check respiratory panel UA negative MRI both feet as per infectious disease recommendations to look for source of bacteremia   Bacteremia with possible endocarditis 2-D echo pending Abnormal cardiac enzymes exam cardiology consulted Continue Lopressor Patient may need a TEE   Diarrhea C. difficile negative   Poorly controlled diabetes Hemoglobin A1c 8.1 Patient admits to being noncompliant with medications Continue current insulin regimen Diabetes coordinator consult   Hypertension Placed on Lopressor BID, hydralazine as needed  Acute renal failure Likely prerenal with a baseline creatinine of 1.24 Creatinine worsening Increase IV hydration to see this improves if not the patient will need further workup Will order renal ultrasound  Hypokalemia replete    Code Status: full Family Communication: family updated about patient's clinical progress Disposition Plan:  As above    Brief narrative: Dwayne [name] is a 35 y.o. male with a history of Uncontrolled HTN, DM2 who presents to the ED with complaints of diarrhea and fevers and chills x 24 hours. He reports having weakness and fatigue and He denies any nausea or vomiting. No one is sick at home, and he denies taking any recent antibiotic therapy. He reports that he has been out of his  medications for months, and has not seen his PCP in months as well. 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   Consultants:  None  Procedures:  None  Antibiotics: Flagyl discontinued < 1/11 Vancomycin and Zosyn > 1/11  HPI/Subjective: Patient tachycardic, hypertensive, febrile Diarrhea has improved, still complaining of nausea and vomiting Nausea improved , Denies significant right shoulder pain   Objective: Filed Vitals:   01/10/15 1219 01/10/15 2009 01/11/15 0041 01/11/15 0426  BP: 156/97 149/90 131/80 110/62  Pulse: 110 105 104 109  Temp: 99.7 F (37.6 C) 99.3 F (37.4 C) 101.1 F (38.4 C) 100 F (37.8 C)  TempSrc:  Oral Oral Oral  Resp: Height:      Weight:      SpO2: 96% 98% 100% 100%    Intake/Output Summary (Last 24 hours) at 01/11/15 1222 Last data filed at 01/11/15 0721  Gross per 24 hour  Intake   2300 ml  Output    401 ml  Net   1899 ml    Exam:  General: alert & oriented x 3 In NAD  Cardiovascular: RRR, nl S1 s2  Respiratory: Decreased breath sounds at the bases, scattered rhonchi, no crackles  Abdomen: soft +BS NT/ND, no masses palpable  Extremities: No cyanosis and no edema      Data Reviewed: Basic Metabolic Panel:  Recent Labs Lab 01/09/15 0021 01/09/15 0500 01/10/15 0508 01/10/15 1121 01/11/15 0235 01/11/15 0953  NA 135 136 132*  --  131*  --   K 3.1* 3.0* 3.0*  --  3.6  --   CL 100 105  103  --  100  --   CO2 26 23 23   --  21  --   GLUCOSE 275* 253* 103*  --  172*  --   BUN 16 17 21   --  29*  --   CREATININE 2.06* 1.86* 2.15*  --  2.52*  --   CALCIUM 8.1* 7.7* 8.0*  --  8.0*  --   MG  --  1.5  --  1.6  --  1.9    Liver Function Tests:  Recent Labs Lab 01/09/15 0021 01/11/15 0235  AST 25 20  ALT 19 23  ALKPHOS 86 80  BILITOT 0.6 0.6  PROT 6.0 6.3  ALBUMIN 2.1* 1.7*   No results for input(s): LIPASE, AMYLASE in the last  168 hours. No results for input(s): AMMONIA in the last 168 hours.  CBC:  Recent Labs Lab 01/09/15 0021 01/09/15 0500 01/10/15 0508 01/11/15 0235  WBC 11.3* 10.5 13.8* 15.3*  NEUTROABS 9.3*  --   --   --   HGB 11.7* 10.6* 12.2* 12.1*  HCT 34.7* 31.8* 35.1* 34.8*  MCV 82.6 82.4 81.6 81.3  PLT 179 141* 126* 153    Cardiac Enzymes:  Recent Labs Lab 01/10/15 1517 01/10/15 2036 01/11/15 0235  TROPONINI 0.36* 0.33* 0.28*   BNP (last 3 results) No results for input(s): PROBNP in the last 8760 hours.   CBG:  Recent Labs Lab 01/10/15 0750 01/10/15 1213 01/10/15 1608 01/10/15 2115 01/11/15 0712  GLUCAP 142* 108* 234* 212* 155*    Recent Results (from the past 240 hour(s))  Urine culture     Status: None   Collection Time: 01/09/15  1:31 AM  Result Value Ref Range Status   Specimen Description URINE, CLEAN CATCH  Final   Special Requests NONE  Final   Colony Count   Final    3,000 COLONIES/ML Performed at [facility]    Culture   Final    INSIGNIFICANT GROWTH Performed at [facility]    Report Status 01/10/2015 FINAL  Final  Stool culture     Status: None (Preliminary result)   Collection Time: 01/09/15  4:09 AM  Result Value Ref Range Status   Specimen Description VAGINAL/RECTAL  Final   Special Requests Normal  Final   Culture   Final    NO SUSPICIOUS COLONIES, CONTINUING TO HOLD Performed at [facility]    Report Status PENDING  Incomplete  Clostridium Difficile by PCR     Status: None   Collection Time: 01/09/15  4:09 AM  Result Value Ref Range Status   C difficile by pcr NEGATIVE NEGATIVE Final  Culture, blood (routine x 2)     Status: None (Preliminary result)   Collection Time: 01/10/15  4:35 AM  Result Value Ref Range Status   Specimen Description BLOOD RIGHT HAND  Final   Special Requests BOTTLES DRAWN AEROBIC AND ANAEROBIC 10CC EA  Final   Culture   Final    GRAM POSITIVE COCCI IN CLUSTERS Note: Culture results  may be compromised due to an excessive volume of blood received in culture bottles. Performed at [facility]    Report Status PENDING  Incomplete  Culture, blood (routine x 2)     Status: None (Preliminary result)   Collection Time: 01/10/15  4:46 AM  Result Value Ref Range Status   Specimen Description BLOOD LEFT ARM  Final   Special Requests BOTTLES DRAWN AEROBIC AND ANAEROBIC 10CC EA  Final  Culture   Final    GRAM POSITIVE COCCI IN CLUSTERS Note: Culture results may be compromised due to an excessive volume of blood received in culture bottles. [facility]    Report Status PENDING  Incomplete  Culture, blood (routine x 2)     Status: None (Preliminary result)   Collection Time: 01/10/15 11:19 AM  Result Value Ref Range Status   Specimen Description BLOOD LEFT FOREARM  Final   Special Requests BOTTLES DRAWN AEROBIC ONLY 5CC  Final   Culture   Final    GRAM POSITIVE COCCI IN CLUSTERS Note: Gram Stain Report Called to,Read Back By and Verified With: NURSE MONICA YIM 01/11/15 6:05AM [place] [facility]    Report Status PENDING  Incomplete  Culture, blood (routine x 2)     Status: None (Preliminary result)   Collection Time: 01/10/15 11:27 AM  Result Value Ref Range Status   Specimen Description BLOOD LEFT ANTECUBITAL  Final   Special Requests BOTTLES DRAWN AEROBIC ONLY 7CC  Final   Culture   Final    GRAM POSITIVE COCCI IN CLUSTERS Note: Gram Stain Report Called to,Read Back By and Verified With: NURSE MONICA YIM 01/11/15 6:05AM [place] [facility]    Report Status PENDING  Incomplete     Studies: Dg Shoulder Right  01/09/2015   CLINICAL DATA:  Anterior right shoulder pain  EXAM: RIGHT SHOULDER - 2+ VIEW  COMPARISON:  None.  FINDINGS: No fracture or dislocation is seen.  The joint spaces are preserved.  The visualized soft tissues are unremarkable.  Visualized right lung is clear.  IMPRESSION: No fracture  or dislocation is seen.   Electronically Signed   By: Charline BillsSriyesh  Krishnan M.D.   On: 01/09/2015 09:44   Mr Shoulder Right Wo Contrast  01/11/2015   CLINICAL DATA:  Right shoulder pain.  EXAM: MRI OF THE RIGHT SHOULDER WITHOUT CONTRAST  TECHNIQUE: Multiplanar, multisequence MR imaging of the shoulder was performed. No intravenous contrast was administered.  COMPARISON:  Radiographs dated 01/09/2015  FINDINGS: Rotator cuff:  Normal.  Muscles:  Normal.  Biceps long head: Properly located and intact. There is inflammation in the soft tissues around the bicipital tendon sheath with fluid in the bicipital tendon sheath.  Acromioclavicular Joint: Small degenerative cyst in the acromion. Otherwise normal AC joint. Type 1 acromion. No bursitis.  Glenohumeral Joint: There is a moderate glenohumeral joint effusion with extravasation of contrast from the subcoracoid recess of the joint into the adjacent soft tissues. Fluid distends the bicipital tendon sheath.  Labrum:  Intact.  Bones: No significant abnormalities. Tiny degenerative cystic areas in the posterior aspect of the greater tuberosity of the proximal humerus.  IMPRESSION: 1. Moderate glenohumeral joint effusion with some extravasation of joint. Fluid distends the bicipital tendon sheath with inflammation/extravasated joint fluid around the bicipital tendon sheath. The possibly of synovitis should be considered. 2. Normal rotator cuff.   Electronically Signed   By: Geanie CooleyJim  Maxwell M.D.   On: 01/11/2015 11:21   Mr Foot Left Wo Contrast  01/11/2015   CLINICAL DATA:  Diabetic foot ulcers.  Foot pain.  Bony erosions.  EXAM: MRI OF THE LEFT FOREFOOT WITHOUT CONTRAST  TECHNIQUE: Multiplanar, multisequence MR imaging was performed. No intravenous contrast was administered.  COMPARISON:  Radiograph dated 01/09/2015  FINDINGS: There is slight edema throughout the medial cuneiform. There is artifactual increased signal from the metatarsal heads in from the proximal phalanx of  the great toe.  There are erosions at IP joint and first metatarsal phalangeal joint consistent with gout.  There are no soft tissue abscesses and there is no osteomyelitis. There is prominent edema in the subcutaneous fat of the dorsum of the foot.  IMPRESSION: No evidence of osteomyelitis or abscess. The edema in the medial cuneiform is most likely a stress reaction. 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   Electronically Signed   By: Geanie Cooley M.D.   On: 01/11/2015 12:15   Dg Chest Port 1 View  01/10/2015   CLINICAL DATA:  Fever and sweats.  EXAM: PORTABLE CHEST - 1 VIEW  COMPARISON:  None.  FINDINGS: Low lung volumes with vascular crowding and streaky subsegmental atelectasis. No obvious infiltrate or effusion. The heart is within normal limits in size given the AP portable technique. The bony structures are grossly normal.  IMPRESSION: Low lung volumes with vascular crowding and bibasilar atelectasis.   Electronically Signed   By: Loralie Champagne M.D.   On: 01/10/2015 11:27   Dg Foot Complete Left  01/09/2015   CLINICAL DATA:  35 year old male with recurrent bilateral foot pain.  EXAM: LEFT FOOT - COMPLETE 3+ VIEW  COMPARISON:  01/05/2014.  FINDINGS: Large periarticular erosions are noted adjacent to the first MTP joint, and at the first interphalangeal joint along the medial margin in both locations. These have erosions have sclerotic margins and overhanging edges, and there is adjacent soft tissue prominence, suggestive of gouty erosions. No adjacent soft tissue calcification is noted. No acute displaced fracture, subluxation or dislocation. Small heterotopic ossification adjacent to the plantar aspect of the calcaneus, likely ossification within the plantar fascia.  IMPRESSION: 1. Findings, as above, suggestive of underlying gout. Clinical correlation is recommended.   Electronically Signed   By: Trudie Reed M.D.   On: 01/09/2015 09:46   Dg Foot Complete  Right  01/09/2015   CLINICAL DATA:  Recurring bilateral foot pain  EXAM: RIGHT FOOT COMPLETE - 3+ VIEW  COMPARISON:  None.  FINDINGS: No fracture or dislocation is seen.  Marginal erosion at the medial aspect of the 1st IP joint, involving the proximal phalanx.  The visualized soft tissues are unremarkable.  IMPRESSION: Marginal erosion at the medial aspect of the 1st IP joint. Correlate for inflammatory arthropathy such as gout.  Otherwise, no acute osseus abnormality is seen.   Electronically Signed   By: Charline Bills M.D.   On: 01/09/2015 09:44    Scheduled Meds: . DAPTOmycin (CUBICIN)  IV  600 mg Intravenous Q24H  . enoxaparin (LOVENOX) injection  40 mg Subcutaneous Daily  . Influenza vac split quadrivalent PF  0.5 mL Intramuscular Tomorrow-1000  . insulin aspart  0-15 Units Subcutaneous TID WC  . insulin aspart  0-5 Units Subcutaneous QHS  . insulin aspart protamine- aspart  20 Units Subcutaneous BID WC  . living well with diabetes book   Does not apply Once  . metoprolol tartrate  25 mg Oral BID  . nicotine  7 mg Transdermal Daily  . sodium chloride  500 mL Intravenous Once   Continuous Infusions: . 0.9 % NaCl with KCl 40 mEq / L 100 mL/hr (01/10/15 1245)    Active Problems:   Tobacco abuse   Noncompliance   Dehydration   Diarrhea   Diabetes type 2, uncontrolled   Hyperglycemia   Uncontrolled hypertension   Diabetic foot ulcers   Pain    Time spent: 40 minutes   Erynne Kealey  [facility] Pager [phone]. If 7PM-7AM, please contact night-coverage at [email or web address], password [facility] 01/11/2015, 12:22 PM  LOS: 2 days

## 2015-01-11 NOTE — Consult Note (Signed)
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  Refer to progress notes for photos in the EMR.  He is followed as an outpatient for foot care and has serial trimming of callous areas performed monthly, he states.  He has been using antibiotic ointment to promote moist healing to both feet. MRI does not indicate abscess or infectious process, but he does have gout indicated which he was not aware of. Wound type: Left and right plantar anterior foot with full thickness wounds Left foot 1X1.X.1cm, dark brown wound bed, no odor or drainage, dry callous surrounding site. Measurement:Right foot .3X.3X.1cm, dark brown wound bed, no odor or drainage, dry callous surrounding site. Dressing procedure/placement/frequency: Continue present plan of care with antibiotic ointment and gauze dressing to protect and promote healing.  Pt can resume follow-up with outpatient physician after discharge. Please re-consult if further assistance is needed.  Thank-you,  Cammie Mcgeeawn Louann Hopson MSN, RN, CWOCN, [place], CNS [phone]

## 2015-01-12 ENCOUNTER — Inpatient Hospital Stay (HOSPITAL_COMMUNITY): Payer: Medicaid Other

## 2015-01-12 DIAGNOSIS — N289 Disorder of kidney and ureter, unspecified: Secondary | ICD-10-CM

## 2015-01-12 DIAGNOSIS — N189 Chronic kidney disease, unspecified: Secondary | ICD-10-CM

## 2015-01-12 DIAGNOSIS — M009 Pyogenic arthritis, unspecified: Secondary | ICD-10-CM | POA: Insufficient documentation

## 2015-01-12 DIAGNOSIS — M60052 Infective myositis, left thigh: Secondary | ICD-10-CM | POA: Insufficient documentation

## 2015-01-12 DIAGNOSIS — L97419 Non-pressure chronic ulcer of right heel and midfoot with unspecified severity: Secondary | ICD-10-CM

## 2015-01-12 DIAGNOSIS — N179 Acute kidney failure, unspecified: Secondary | ICD-10-CM

## 2015-01-12 DIAGNOSIS — A419 Sepsis, unspecified organism: Secondary | ICD-10-CM

## 2015-01-12 DIAGNOSIS — B9562 Methicillin resistant Staphylococcus aureus infection as the cause of diseases classified elsewhere: Secondary | ICD-10-CM

## 2015-01-12 DIAGNOSIS — L97429 Non-pressure chronic ulcer of left heel and midfoot with unspecified severity: Secondary | ICD-10-CM

## 2015-01-12 DIAGNOSIS — R7881 Bacteremia: Secondary | ICD-10-CM | POA: Diagnosis present

## 2015-01-12 LAB — CBC
HEMATOCRIT: 30.8 % — AB (ref 39.0–52.0)
HEMOGLOBIN: 10.3 g/dL — AB (ref 13.0–17.0)
MCH: 27.5 pg (ref 26.0–34.0)
MCHC: 33.4 g/dL (ref 30.0–36.0)
MCV: 82.1 fL (ref 78.0–100.0)
PLATELETS: 169 10*3/uL (ref 150–400)
RBC: 3.75 MIL/uL — AB (ref 4.22–5.81)
RDW: 13.6 % (ref 11.5–15.5)
WBC: 13.4 10*3/uL — ABNORMAL HIGH (ref 4.0–10.5)

## 2015-01-12 LAB — COMPREHENSIVE METABOLIC PANEL
ALBUMIN: 1.4 g/dL — AB (ref 3.5–5.2)
ALK PHOS: 81 U/L (ref 39–117)
ALT: 23 U/L (ref 0–53)
ANION GAP: 10 (ref 5–15)
AST: 25 U/L (ref 0–37)
BUN: 34 mg/dL — ABNORMAL HIGH (ref 6–23)
CO2: 21 mmol/L (ref 19–32)
Calcium: 7.5 mg/dL — ABNORMAL LOW (ref 8.4–10.5)
Chloride: 98 mEq/L (ref 96–112)
Creatinine, Ser: 3.15 mg/dL — ABNORMAL HIGH (ref 0.50–1.35)
GFR calc Af Amer: 28 mL/min — ABNORMAL LOW (ref >90)
GFR calc non Af Amer: 24 mL/min — ABNORMAL LOW (ref >90)
Glucose, Bld: 97 mg/dL (ref 70–99)
Potassium: 3 mmol/L — ABNORMAL LOW (ref 3.5–5.1)
Sodium: 129 mmol/L — ABNORMAL LOW (ref 135–145)
TOTAL PROTEIN: 5 g/dL — AB (ref 6.0–8.3)
Total Bilirubin: 0.6 mg/dL (ref 0.3–1.2)

## 2015-01-12 LAB — STOOL CULTURE: Special Requests: NORMAL

## 2015-01-12 LAB — HEMOGLOBIN A1C
Hgb A1c MFr Bld: 8.3 % — ABNORMAL HIGH (ref <5.7)
MEAN PLASMA GLUCOSE: 192 mg/dL — AB (ref <117)

## 2015-01-12 LAB — GLUCOSE, CAPILLARY
GLUCOSE-CAPILLARY: 82 mg/dL (ref 70–99)
Glucose-Capillary: 241 mg/dL — ABNORMAL HIGH (ref 70–99)
Glucose-Capillary: 153 mg/dL — ABNORMAL HIGH (ref 70–99)

## 2015-01-12 LAB — TROPONIN I
Troponin I: 0.84 ng/mL (ref <0.031)
Troponin I: 0.65 ng/mL (ref <0.031)
Troponin I: 0.47 ng/mL — ABNORMAL HIGH (ref <0.031)

## 2015-01-12 LAB — HIV ANTIBODY (ROUTINE TESTING W REFLEX)
HIV 1/O/2 Abs-Index Value: <1 (ref <1.00)
HIV-1/HIV-2 Ab: NONREACTIVE

## 2015-01-12 LAB — SEDIMENTATION RATE: Sed Rate: 111 mm/hr — ABNORMAL HIGH (ref 0–16)

## 2015-01-12 LAB — PREALBUMIN: Prealbumin: 5.8 mg/dL — ABNORMAL LOW (ref 17.0–34.0)

## 2015-01-12 LAB — CK: CK TOTAL: 49 U/L (ref 7–232)

## 2015-01-12 MED ORDER — PRO-STAT SUGAR FREE PO LIQD
30.0000 mL | Freq: Every day | ORAL | Status: DC
  Administered 2015-01-12: 30 mL via ORAL
  Filled 2015-01-12 (×9): qty 30

## 2015-01-12 MED ORDER — SODIUM CHLORIDE 0.9 % IV SOLN: 600.0000 mg | INTRAVENOUS | Status: DC

## 2015-01-12 MED ORDER — SODIUM CHLORIDE 0.9 % IV SOLN
INTRAVENOUS | Status: DC
  Administered 2015-01-13: 500 mL via INTRAVENOUS

## 2015-01-12 MED ORDER — SODIUM CHLORIDE 0.9 % IV SOLN: INTRAVENOUS | Status: DC

## 2015-01-12 MED ORDER — CEFAZOLIN SODIUM-DEXTROSE 2-3 GM-% IV SOLR
2.0000 g | Freq: Two times a day (BID) | INTRAVENOUS | Status: DC
  Administered 2015-01-12 – 2015-01-13 (×2): 2 g via INTRAVENOUS
  Filled 2015-01-12 (×3): qty 50

## 2015-01-12 MED ORDER — GLUCERNA SHAKE PO LIQD
237.0000 mL | Freq: Two times a day (BID) | ORAL | Status: DC
  Administered 2015-01-12 – 2015-01-19 (×4): 237 mL via ORAL

## 2015-01-12 MED ORDER — ASPIRIN 81 MG PO CHEW
CHEWABLE_TABLET | ORAL | Status: AC
  Filled 2015-01-12: qty 4

## 2015-01-12 MED ORDER — ASPIRIN 81 MG PO CHEW
324.0000 mg | CHEWABLE_TABLET | Freq: Once | ORAL | Status: AC
  Administered 2015-01-12: 324 mg via ORAL

## 2015-01-12 NOTE — Progress Notes (Signed)
Subjective: CP  Objective: Vital signs in last 24 hours: Temp:  [98.6 F (37 C)-100.4 F (38 C)] 99.3 F (37.4 C) (01/13 0538) Pulse Rate:  [91-109] 91 (01/13 0538) Resp:  [17-20] 20 (01/13 0538) BP: (98-127)/(61-68) 114/67 mmHg (01/13 0538) SpO2:  [93 %-99 %] 94 % (01/13 0538) Last BM Date: 01/10/15  Intake/Output from previous day: 01/12 0701 - 01/13 0700 In: 2332 [P.O.:720; I.V.:1500; IV Piggyback:112] Out: 1151 [Urine:1051; Emesis/NG output:100] Intake/Output this shift: Total I/O In: 720 [P.O.:720] Out: -   Medications Current Facility-Administered Medications  Medication Dose Route Frequency Provider Last Rate Last Dose  . 0.9 %  sodium chloride infusion   Intravenous Continuous Richarda OverlieNayana Abrol, MD 150 mL/hr at 01/11/15 1307    . acetaminophen (TYLENOL) tablet 650 mg  650 mg Oral Q6H PRN Ron ParkerHarvette C Jenkins, MD   650 mg at 01/12/15 0022   Or  . acetaminophen (TYLENOL) suppository 650 mg  650 mg Rectal Q6H PRN Ron ParkerHarvette C Jenkins, MD      . alum & mag hydroxide-simeth (MAALOX/MYLANTA) 200-200-20 MG/5ML suspension 30 mL  30 mL Oral Q6H PRN Ron ParkerHarvette C Jenkins, MD      . DAPTOmycin (CUBICIN) 600 mg in sodium chloride 0.9 % IVPB  600 mg Intravenous Q24H Herby AbrahamMichelle T Bell, RPH   600 mg at 01/11/15 1259  . enoxaparin (LOVENOX) injection 40 mg  40 mg Subcutaneous Daily Ron ParkerHarvette C Jenkins, MD   40 mg at 01/12/15 1018  . feeding supplement (RESOURCE BREEZE) (RESOURCE BREEZE) liquid 1 Container  1 Container Oral BID BM Marijean NiemannStephanie La, RD   1 Container at 01/12/15 1019  . hydrALAZINE (APRESOLINE) injection 10 mg  10 mg Intravenous Q6H PRN Ron ParkerHarvette C Jenkins, MD   10 mg at 01/10/15 0654  . HYDROmorphone (DILAUDID) injection 1 mg  1 mg Intravenous Q3H PRN Ripudeep Jenna LuoK Rai, MD   1 mg at 01/12/15 1111  . Influenza vac split quadrivalent PF (FLUARIX) injection 0.5 mL  0.5 mL Intramuscular Tomorrow-1000 Ripudeep K Rai, MD   0.5 mL at 01/10/15 1000  . insulin aspart (novoLOG) injection 0-15 Units   0-15 Units Subcutaneous TID WC Ripudeep Jenna LuoK Rai, MD   3 Units at 01/11/15 1704  . insulin aspart (novoLOG) injection 0-5 Units  0-5 Units Subcutaneous QHS Ripudeep Jenna LuoK Rai, MD   2 Units at 01/10/15 2208  . insulin aspart protamine- aspart (NOVOLOG MIX 70/30) injection 20 Units  20 Units Subcutaneous BID WC Ripudeep Jenna LuoK Rai, MD   20 Units at 01/12/15 0849  . living well with diabetes book MISC   Does not apply Once Richarda OverlieNayana Abrol, MD      . metoprolol tartrate (LOPRESSOR) tablet 25 mg  25 mg Oral BID Ripudeep Jenna LuoK Rai, MD   25 mg at 01/12/15 1018  . neomycin-bacitracin-polymyxin (NEOSPORIN) ointment   Topical Daily Richarda OverlieNayana Abrol, MD      . nicotine (NICODERM CQ - dosed in mg/24 hr) patch 7 mg  7 mg Transdermal Daily Ron ParkerHarvette C Jenkins, MD   7 mg at 01/09/15 0855  . ondansetron (ZOFRAN) tablet 4 mg  4 mg Oral Q6H PRN Ron ParkerHarvette C Jenkins, MD   4 mg at 01/10/15 0012   Or  . ondansetron (ZOFRAN) injection 4 mg  4 mg Intravenous Q6H PRN Ron ParkerHarvette C Jenkins, MD   4 mg at 01/11/15 0721  . oxyCODONE (Oxy IR/ROXICODONE) immediate release tablet 5 mg  5 mg Oral Q4H PRN Ripudeep Jenna LuoK Rai, MD   5 mg at  01/11/15 1705  . sodium chloride 0.9 % bolus 500 mL  500 mL Intravenous Once Richarda Overlie, MD        PE: General appearance: alert, cooperative and no distress Lungs: clear to auscultation bilaterally Heart: Reg rhythm, rate elevated.  Rub at LSB. Abdomen: +BS nontender Extremities: 1+ bilateral LEE Seems > on the left. Pulses: 2+ and symmetric Skin: Warm and dry Neurologic: Grossly normal  Lab Results:   Recent Labs  01/10/15 0508 01/11/15 0235 01/12/15 0614  WBC 13.8* 15.3* 13.4*  HGB 12.2* 12.1* 10.3*  HCT 35.1* 34.8* 30.8*  PLT 126* 153 169   BMET  Recent Labs  01/10/15 0508 01/11/15 0235 01/12/15 0614  NA 132* 131* 129*  K 3.0* 3.6 3.0*  CL 103 100 98  CO2 GLUCOSE 103* 172* 97  BUN 21 29* 34*  CREATININE 2.15* 2.52* 3.15*  CALCIUM 8.0* 8.0* 7.5*    Cardiac Panel (last 3  results)  Recent Labs  01/10/15 2036 01/11/15 0235 01/12/15 0614  CKTOTAL  --   --  49  TROPONINI 0.33* 0.28* 0.84*     Assessment/Plan 35 y.o. male with a PMHx of uncontrolled hypertension, type 2 diabetes mellitus, who was admitted to [facility] on 01/09/2015 for evaluation of diarrhea, fevers, chills for the past 24 hours. A troponin was checked and found to be elevated  Active Problems:   Tobacco abuse   Noncompliance   Dehydration   Diarrhea   Diabetes type 2, uncontrolled   Hyperglycemia   Uncontrolled hypertension  BP looks good.  Some mild hypotension   Diabetic foot ulcers   Pain   Diabetic ulcer of left foot associated with type 2 diabetes mellitus   Osteomyelitis   Shoulder pain   Thigh pain, musculoskeletal   Preliminary LE ABIs: left 0.64 and right 1.15.  Recommend full arterial dopplers on the left.      Septic shock   Staphylococcus aureus bacteremia with sepsis  ID following.    Smoker   Elevated troponin Troponin up slightly from prior(0.28>>0.84) in the setting of sepsis and CP early his morning.  He is complaining of CP which is reproducible with palpation and seemed to bother him when he sat up.  0529hr EKG show LVH and mild ST changes inferolateral leads.  Apparently this was evaluated by on-call cardiologist.  He has a rub on exam and may be developing pericarditis or myopercarditis.  Consider cardiac MRI.  With worsening SCr, I am concerned about adding antiinflammatory/colchicine.. EF normal on echo, severe focal basal and moderate concentric hypertrophy of the leftventricle, G1DD.  Trivial pericardial effusion.  We will continue to trend.  LVH could be from history of uncontrolled HTN.     Acute renal failure.  SCr up to 3.15.  Baseline 1.4.     Hypokalemia:  replace   Hypoalbuminemia:  Trending down: 2.1>>1.4.  >300 protein in UA.      LOS: 3 days    HAGER, BRYAN PA-C 01/12/2015 11:28 AM  Personally seen and examined. Agree with above. +Staph  Blood Cx AKI Plan for TEE tomorrow. NPO past midnight. Discussed procedure, risks and benefits.  No NSAIDS with AKI Quite ill. Decreased albumin in the setting of acute illness/infection.   Donato Schultz, MD

## 2015-01-12 NOTE — Progress Notes (Signed)
ANTIBIOTIC CONSULT NOTE - follow up Pharmacy Consult for:  daptomycin and cefazolin Indication: bacteremia  No Known Allergies  Patient Measurements:  Height: 6' 2"  (188 cm) Weight: 226 lb 6.4 oz (102.694 kg) IBW/kg (Calculated) : 82.2  Vital Signs: Temp: 99.3 F (37.4 C) (01/13 0538) Temp Source: Oral (01/13 0538) BP: 114/67 mmHg (01/13 0538) Pulse Rate: 91 (01/13 0538) Intake/Output from previous day: 01/12 0701 - 01/13 0700 In: 2332 [P.O.:720; I.V.:1500; IV Piggyback:112] Out: 6767 [Urine:1051; Emesis/NG output:100]  Labs:  Recent Labs  01/10/15 0508 01/11/15 0235 01/12/15 0614  WBC 13.8* 15.3* 13.4*  HGB 12.2* 12.1* 10.3*  PLT 126* 153 169  CREATININE 2.15* 2.52* 3.15*   Estimated Creatinine Clearance: 42.3 mL/min (by C-G formula based on Cr of 3.15).   Microbiology: Recent Results (from the past 720 hour(s))  Urine culture     Status: None   Collection Time: 01/09/15  1:31 AM  Result Value Ref Range Status   Specimen Description URINE, CLEAN CATCH  Final   Special Requests NONE  Final   Colony Count   Final    3,000 COLONIES/ML Performed at [facility]    Culture   Final    INSIGNIFICANT GROWTH Performed at [facility]    Report Status 01/10/2015 FINAL  Final  Stool culture     Status: None   Collection Time: 01/09/15  4:09 AM  Result Value Ref Range Status   Specimen Description VAGINAL/RECTAL  Final   Special Requests Normal  Final   Culture   Final    NO SALMONELLA, SHIGELLA, CAMPYLOBACTER, YERSINIA, OR E.COLI 0157:H7 ISOLATED Performed at [facility]    Report Status 01/12/2015 FINAL  Final  Clostridium Difficile by PCR     Status: None   Collection Time: 01/09/15  4:09 AM  Result Value Ref Range Status   C difficile by pcr NEGATIVE NEGATIVE Final  Culture, blood (routine x 2)     Status: None (Preliminary result)   Collection Time: 01/10/15  4:35 AM  Result Value Ref Range Status   Specimen Description BLOOD  RIGHT HAND  Final   Special Requests BOTTLES DRAWN AEROBIC AND ANAEROBIC 10CC EA  Final   Culture   Final    STAPHYLOCOCCUS AUREUS Note: Culture results may be compromised due to an excessive volume of blood received in culture bottles. Gram Stain Report Called to,Read Back By and Verified With:  MONICA YIM ON 01/10/2015 AT 11:36P BY WILEJ Performed at [facility]    Report Status PENDING  Incomplete  Culture, blood (routine x 2)     Status: None (Preliminary result)   Collection Time: 01/10/15  4:46 AM  Result Value Ref Range Status   Specimen Description BLOOD LEFT ARM  Final   Special Requests BOTTLES DRAWN AEROBIC AND ANAEROBIC 10CC EA  Final   Culture   Final    STAPHYLOCOCCUS AUREUS Note: Culture results may be compromised due to an excessive volume of blood received in culture bottles. Gram Stain Report Called to,Read Back By and Verified With: MONICA YIM ON 01/10/2015 AT 11:36P BY WILEJ Performed at [facility]    Report Status PENDING  Incomplete  Respiratory virus panel (routine influenza)     Status: None   Collection Time: 01/10/15 10:34 AM  Result Value Ref Range Status   Source - RVPAN NASAL SWAB  Corrected    Comment: CORRECTED ON 01/12 AT 2036: PREVIOUSLY REPORTED AS NASAL SWAB   Respiratory Syncytial Virus A  NOT DETECTED  Final   Respiratory Syncytial Virus B NOT DETECTED  Final   Influenza A NOT DETECTED  Final   Influenza B NOT DETECTED  Final   Parainfluenza 1 NOT DETECTED  Final   Parainfluenza 2 NOT DETECTED  Final   Parainfluenza 3 NOT DETECTED  Final   Metapneumovirus NOT DETECTED  Final   Rhinovirus NOT DETECTED  Final   Adenovirus NOT DETECTED  Final   Influenza A H1 NOT DETECTED  Final   Influenza A H3 NOT DETECTED  Final    Comment: (NOTE)       Normal Reference Range for each Analyte: NOT DETECTED Testing performed using the Lumine[employer] xTAG Respiratory Viral Panel test kit. The analytical performance characteristics of this assay have  been determined by [facility].  The modifications have not been cleared or approved by the FDA. This assay has been validated pursuant to the CLIA regulations and is used for clinical purposes. Performed at [facility], blood (routine x 2)     Status: None (Preliminary result)   Collection Time: 01/10/15 11:19 AM  Result Value Ref Range Status   Specimen Description BLOOD LEFT FOREARM  Final   Special Requests BOTTLES DRAWN AEROBIC ONLY 5CC  Final   Culture   Final    STAPHYLOCOCCUS AUREUS Note: RIFAMPIN AND GENTAMICIN SHOULD NOT BE USED AS SINGLE DRUGS FOR TREATMENT OF STAPH INFECTIONS. Note: Gram Stain Report Called to,Read Back By and Verified With: NURSE MONICA YIM 01/11/15 6:05AM THOMI Performed at [facility]    Report Status PENDING  Incomplete  Culture, blood (routine x 2)     Status: None (Preliminary result)   Collection Time: 01/10/15 11:27 AM  Result Value Ref Range Status   Specimen Description BLOOD LEFT ANTECUBITAL  Final   Special Requests BOTTLES DRAWN AEROBIC ONLY [place]  Final   Culture   Final    STAPHYLOCOCCUS AUREUS Note: Gram Stain Report Called to,Read Back By and Verified With: NURSE MONICA YIM 01/11/15 6:05AM [place] Performed at [facility]    Report Status PENDING  Incomplete    Medical History: Past Medical History  Diagnosis Date  . Diabetes mellitus   . Hypertension    Assessment:  35 yr old male admitted on 01/09/14 with diarrhea, fever and chills x 24 hrs.  Flagyl given x 24hrs, stopped 1/11 with C diff negative.  Seen at wound clinic last week.  Prior on doxy + cipro.   Pharmacy consulted to switch Vanc and Zosyn to daptomycin for bacteremia on 1/13. Marland Kitchen 4/4 Blood cultures on 1/11 with staph aureus, sensitivities pending.    Tmax 102.9 on 1/11, was 100.4 last night. Has had 3 doses of tylenol in past 24 hours.  ESR 104>113>111, CRP 20.0. Hx diabetic foot ulcers. Creatinine trending up 1.86>2.15>2.52>3.15  due to ARF 2nd sepsis.   Wt 102.7 kg; WBC 13.4.(last dose of  Prednisone  1/11).  Baseline CK = 45 on 1/13 for daptomycin monitoring. 1/12 MRI R shoulder: possibility of synovitis 1/12 MRI L foot: no osteo or abscess 1/12 MRI R foot: gout, no abscesses 1/13 MRI L femur: ip 1/13 Echo:  Normal EF, severe focal basal and moderate concentric hypertrophy  Vanc 1/11>>1/12 Zosyn 1/11>>1/12 Flagyl 1/10>>1/11 daptomycin 1/12>> Cefazolin 1/13>>  1/10 urine -3K insig growth 1/10 C diff neg 1/11 blood x 4 -staph aureus  1/11 RSV -neg  Goal of Therapy:  Eradicate bacteremia   Plan:  -daptomycin 6 mg/kg =  600 mg IV q48 hrs - change interval to q48hr due to ARF -weekly creatine kinase qWednesday -Ancef 2 gm IV q12h until sensitivities resulted  Eudelia Bunch, Pharm.D. [phone] 01/12/2015 2:23 PM

## 2015-01-12 NOTE — Consult Note (Signed)
Referring Provider: No ref. provider found Primary Care Physician:  No PCP Per Patient Primary Nephrologist:    Reason for Consultation:  Acute renal failure , hypokalemia anemia and hyponatremia HPI: Dwayne HickmanBenjamin [name] is a 35 y.o. male with a history of Uncontrolled HTN, DM2 who presents to the ED with complaints of diarrhea and fevers and chills x 24 hours. He reports having weakness and fatigue and He denies any nausea or vomiting. No one is sick at home, and he denies taking any recent antibiotic therapy. He reports that he has been out of his medications for months, and has not seen his PCP in months as well. 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 Was found to have staph bacteremia. Baseline creatinine was normal although creatinine was elevated on admission. No contrast administration and no NSAIDS. Received vancomycin. Outpatient medications included lisinopril and metformin  Past Medical History  Diagnosis Date  . Diabetes mellitus   . Hypertension     Past Surgical History  Procedure Laterality Date  . I&d extremity Bilateral 01/16/2014    Procedure:  DEBRIDEMENT of bilateral foot ulcers;  Surgeon: Kathryne Hitchhristopher Y Blackman, MD;  [facility];  Service: Orthopedics;  Laterality: Bilateral;    Prior to Admission medications   Medication Sig Start Date End Date Taking? Authorizing Provider  oxyCODONE (OXY IR/ROXICODONE) 5 MG immediate release tablet Take 1 tablet (5 mg total) by mouth every 4 (four) hours as needed for moderate pain or severe pain. 03/16/14  Yes Dorothea OgleIskra M Myers, MD  ciprofloxacin (CIPRO) 500 MG tablet Take 1 tablet (500 mg total) by mouth 2 (two) times daily. X 2 weeks Patient not taking: Reported on 01/09/2015 01/19/14   Ripudeep Jenna LuoK Rai, MD  doxycycline (VIBRA-TABS) 100 MG tablet Take 1 tablet (100 mg total) by mouth 2 (two) times daily. X 2 weeks Patient not taking: Reported on 01/09/2015  01/19/14   Ripudeep Jenna LuoK Rai, MD  furosemide (LASIX) 20 MG tablet Take 1 tablet (20 mg total) by mouth daily. Patient not taking: Reported on 01/09/2015 03/16/14   Dorothea OgleIskra M Myers, MD  insulin aspart protamine- aspart (NOVOLOG MIX 70/30) (70-30) 100 UNIT/ML injection Inject 0.25 mLs (25 Units total) into the skin 2 (two) times daily with a meal. Patient not taking: Reported on 01/09/2015 01/28/14   Quentin Angstlugbemiga E Jegede, MD  Insulin Syringe-Needle U-100 28G X 1/2" 0.5 ML MISC Use as directed. Any generic available. Patient not taking: Reported on 01/09/2015 01/28/14   Quentin Angstlugbemiga E Jegede, MD  Lancets (FREESTYLE) lancets Any generic available. Use as instructed Patient not taking: Reported on 01/09/2015 01/28/14   Quentin Angstlugbemiga E Jegede, MD  lisinopril (PRINIVIL,ZESTRIL) 20 MG tablet Take 1 tablet (20 mg total) by mouth daily. Patient not taking: Reported on 01/09/2015 03/16/14   Dorothea OgleIskra M Myers, MD  metFORMIN (GLUCOPHAGE) 500 MG tablet Take 1 tablet (500 mg total) by mouth 2 (two) times daily with a meal. Patient not taking: Reported on 01/09/2015 03/16/14   Dorothea OgleIskra M Myers, MD    Current Facility-Administered Medications  Medication Dose Route Frequency Provider Last Rate Last Dose  . 0.9 %  sodium chloride infusion   Intravenous Continuous Richarda OverlieNayana Abrol, MD 150 mL/hr at 01/11/15 1307    . acetaminophen (TYLENOL) tablet 650 mg  650 mg Oral Q6H PRN Ron ParkerHarvette C Jenkins, MD   650 mg at 01/12/15 0022   Or  . acetaminophen (TYLENOL) suppository 650  mg  650 mg Rectal Q6H PRN Ron Parker, MD      . alum & mag hydroxide-simeth (MAALOX/MYLANTA) 200-200-20 MG/5ML suspension 30 mL  30 mL Oral Q6H PRN Ron Parker, MD      . DAPTOmycin (CUBICIN) 600 mg in sodium chloride 0.9 % IVPB  600 mg Intravenous Q24H Herby Abraham, RPH   600 mg at 01/11/15 1259  . enoxaparin (LOVENOX) injection 40 mg  40 mg Subcutaneous Daily Ron Parker, MD   40 mg at 01/12/15 1018  . feeding supplement (RESOURCE BREEZE) (RESOURCE  BREEZE) liquid 1 Container  1 Container Oral BID BM Marijean Niemann, RD   1 Container at 01/12/15 1019  . hydrALAZINE (APRESOLINE) injection 10 mg  10 mg Intravenous Q6H PRN Ron Parker, MD   10 mg at 01/10/15 0654  . HYDROmorphone (DILAUDID) injection 1 mg  1 mg Intravenous Q3H PRN Ripudeep Jenna Luo, MD   1 mg at 01/12/15 1111  . Influenza vac split quadrivalent PF (FLUARIX) injection 0.5 mL  0.5 mL Intramuscular Tomorrow-1000 Ripudeep K Rai, MD   0.5 mL at 01/10/15 1000  . insulin aspart (novoLOG) injection 0-15 Units  0-15 Units Subcutaneous TID WC Ripudeep Jenna Luo, MD   3 Units at 01/11/15 1704  . insulin aspart (novoLOG) injection 0-5 Units  0-5 Units Subcutaneous QHS Ripudeep Jenna Luo, MD   2 Units at 01/10/15 2208  . insulin aspart protamine- aspart (NOVOLOG MIX 70/30) injection 20 Units  20 Units Subcutaneous BID WC Ripudeep Jenna Luo, MD   20 Units at 01/12/15 0849  . living well with diabetes book MISC   Does not apply Once Richarda Overlie, MD      . metoprolol tartrate (LOPRESSOR) tablet 25 mg  25 mg Oral BID Ripudeep Jenna Luo, MD   25 mg at 01/12/15 1018  . neomycin-bacitracin-polymyxin (NEOSPORIN) ointment   Topical Daily Richarda Overlie, MD      . nicotine (NICODERM CQ - dosed in mg/24 hr) patch 7 mg  7 mg Transdermal Daily Ron Parker, MD   7 mg at 01/09/15 0855  . ondansetron (ZOFRAN) tablet 4 mg  4 mg Oral Q6H PRN Ron Parker, MD   4 mg at 01/10/15 0012   Or  . ondansetron (ZOFRAN) injection 4 mg  4 mg Intravenous Q6H PRN Ron Parker, MD   4 mg at 01/11/15 0721  . oxyCODONE (Oxy IR/ROXICODONE) immediate release tablet 5 mg  5 mg Oral Q4H PRN Ripudeep Jenna Luo, MD   5 mg at 01/11/15 1705  . sodium chloride 0.9 % bolus 500 mL  500 mL Intravenous Once Richarda Overlie, MD        Allergies as of 01/09/2015  . (No Known Allergies)    History reviewed. No pertinent family history.  History   Social History  . Marital Status: Married    Spouse Name: N/A    Number of Children: N/A   . Years of Education: N/A   Occupational History  . Not on file.   Social History Main Topics  . Smoking status: Current Every Day Smoker    Types: Cigars  . Smokeless tobacco: Never Used  . Alcohol Use: Yes  . Drug Use: No  . Sexual Activity: Not on file   Other Topics Concern  . Not on file   Social History Narrative    Review of Systems: Gen: Admits to +  Fever fatigue, + weakness,  HEENT: No visual complaints,  No reports of Retinopathy. Normal external appearance No Epistaxis or Sore throat. No sinusitis.   CV: Denies chest pain, angina, palpitations, syncope, orthopnea, PND, + peripheral edema, and claudication. Resp: Denies dyspnea at rest, dyspnea with exercise, cough, sputum, wheezing, coughing up blood, and pleurisy. GI: Denies vomiting blood, jaundice, and fecal incontinence.   Denies dysphagia or odynophagia. + diarrhea GU : Denies urinary burning, blood in urine, urinary frequency, urinary hesitancy, nocturnal urination, and urinary incontinence.  No renal calculi. MS: Denies joint pain no NSAIDS Derm: + unhealing ulcers.  Psych: Denies depression, anxiety, memory loss, suicidal ideation, hallucinations, paranoia, and confusion. Heme: Denies bruising, bleeding, and enlarged lymph nodes. Neuro: No headache.  No diplopia. No dysarthria.  No dysphasia.  No history of CVA.  No Seizures. No paresthesias.  No weakness. Endocrine Diabetes.  No Thyroid disease.  No Adrenal disease.  Physical Exam: Vital signs in last 24 hours: Temp:  [98.8 F (37.1 C)-100.4 F (38 C)] 99.3 F (37.4 C) (01/13 0538) Pulse Rate:  [91-109] 91 (01/13 0538) Resp:  [17-20] 20 (01/13 0538) BP: (98-127)/(61-68) 114/67 mmHg (01/13 0538) SpO2:  [93 %-96 %] 94 % (01/13 0538) Last BM Date: 01/10/15 General:   Alert,  Well-developed, well-nourished, pleasant and cooperative in NAD Head:  Normocephalic and atraumatic. Eyes:  Sclera clear, no icterus.   Conjunctiva pink. Ears:  Normal auditory  acuity. Nose:  No deformity, discharge,  or lesions. Mouth:  No deformity or lesions, dentition normal. Neck:  Supple; no masses or thyromegaly. JVP not elevated Lungs:  Clear throughout to auscultation.   No wheezes, crackles, or rhonchi. No acute distress. Heart:  Regular rate and rhythm; no murmurs, clicks, rubs,  or gallops. Abdomen:  Soft, nontender and nondistended. No masses, hepatosplenomegaly or hernias noted. Normal bowel sounds, without guarding, and without rebound.   Msk:  Symmetrical without gross deformities. Normal posture. Pulses:  Diminished  Extremities:  Without clubbing or edema. Neurologic:  Alert and  oriented x4;  grossly normal neurologically. Skin:Ulcerations on Plantar Surface of Both Feet. Cervical Nodes:  No significant cervical adenopathy. Psych:  Alert and cooperative. Normal mood and affect.  Intake/Output from previous day: 01/12 0701 - 01/13 0700 In: 2332 [P.O.:720; I.V.:1500; IV Piggyback:112] Out: 1151 [Urine:1051; Emesis/NG output:100] Intake/Output this shift: Total I/O In: 720 [P.O.:720] Out: -   Lab Results:  Recent Labs  01/10/15 0508 01/11/15 0235 01/12/15 0614  WBC 13.8* 15.3* 13.4*  HGB 12.2* 12.1* 10.3*  HCT 35.1* 34.8* 30.8*  PLT 126* 153 169   BMET  Recent Labs  01/10/15 0508 01/11/15 0235 01/12/15 0614  NA 132* 131* 129*  K 3.0* 3.6 3.0*  CL 103 100 98  CO2 GLUCOSE 103* 172* 97  BUN 21 29* 34*  CREATININE 2.15* 2.52* 3.15*  CALCIUM 8.0* 8.0* 7.5*   LFT  Recent Labs  01/12/15 0614  PROT 5.0*  ALBUMIN 1.4*  AST 25  ALT 23  ALKPHOS 81  BILITOT 0.6   PT/INR No results for input(s): LABPROT, INR in the last 72 hours. Hepatitis Panel No results for input(s): HEPBSAG, HCVAB, HEPAIGM, HEPBIGM in the last 72 hours.  Studies/Results: Mr Shoulder Right Wo Contrast  01/11/2015   CLINICAL DATA:  Right shoulder pain.  EXAM: MRI OF THE RIGHT SHOULDER WITHOUT CONTRAST  TECHNIQUE: Multiplanar,  multisequence MR imaging of the shoulder was performed. No intravenous contrast was administered.  COMPARISON:  Radiographs dated 01/09/2015  FINDINGS: Rotator cuff:  Normal.  Muscles:  Normal.  Biceps long head: Properly located and intact. There is inflammation in the soft tissues around the bicipital tendon sheath with fluid in the bicipital tendon sheath.  Acromioclavicular Joint: Small degenerative cyst in the acromion. Otherwise normal AC joint. Type 1 acromion. No bursitis.  Glenohumeral Joint: There is a moderate glenohumeral joint effusion with extravasation of contrast from the subcoracoid recess of the joint into the adjacent soft tissues. Fluid distends the bicipital tendon sheath.  Labrum:  Intact.  Bones: No significant abnormalities. Tiny degenerative cystic areas in the posterior aspect of the greater tuberosity of the proximal humerus.  IMPRESSION: 1. Moderate glenohumeral joint effusion with some extravasation of joint. Fluid distends the bicipital tendon sheath with inflammation/extravasated joint fluid around the bicipital tendon sheath. The possibly of synovitis should be considered. 2. Normal rotator cuff.   Electronically Signed   By: Geanie Cooley M.D.   On: 01/11/2015 11:21   Mr Foot Right Wo Contrast  01/11/2015   CLINICAL DATA:  Diabetic foot ulcers.  EXAM: MRI OF THE RIGHT FOREFOOT WITHOUT CONTRAST  TECHNIQUE: Multiplanar, multisequence MR imaging was performed. No intravenous contrast was administered.  COMPARISON:  Radiographs dated 01/09/2011  FINDINGS: There are small erosions of the proximal phalanx at the IP joint of the great toe and there are small erosions at the first metatarsal phalangeal joint, both consistent with gout. There are no soft tissue abscesses. There is no osteomyelitis. There is nonspecific edema in the subcutaneous fat of the dorsum of the foot. Muscles and tendons and ligaments appear appear normal. No joint effusions.  IMPRESSION: Subcutaneous edema primarily  on the dorsum of the foot. 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   Electronically Signed   By: Geanie Cooley M.D.   On: 01/11/2015 12:50   Mr Foot Left Wo Contrast  01/11/2015   CLINICAL DATA:  Diabetic foot ulcers.  Foot pain.  Bony erosions.  EXAM: MRI OF THE LEFT FOREFOOT WITHOUT CONTRAST  TECHNIQUE: Multiplanar, multisequence MR imaging was performed. No intravenous contrast was administered.  COMPARISON:  Radiograph dated 01/09/2015  FINDINGS: There is slight edema throughout the medial cuneiform. There is artifactual increased signal from the metatarsal heads in from the proximal phalanx of the great toe. There are erosions at IP joint and first metatarsal phalangeal joint consistent with gout.  There are no soft tissue abscesses and there is no osteomyelitis. There is prominent edema in the subcutaneous fat of the dorsum of the foot.  IMPRESSION: No evidence of osteomyelitis or abscess. The edema in the medial cuneiform is most likely a stress reaction. 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   Electronically Signed   By: Geanie Cooley M.D.   On: 01/11/2015 12:15    Assessment/Plan:  Acute renal failure or acute on chronic renal failure in setting of staph aureus bacteremia. This may be ATN secondary to sepsis with underlying diabetic nephropathy and chronic renal disease. Agree with changing vancomycin to daptomycin. Will check renal ultrasound and protein/ creatinine ratio.  HTN avoid ACE and ARB agents for now  Anemia no ESA at present would not use Iron is setting in infection  Hypokalemia  Replace with potassium  Hyponatremia secondary to ARF   LOS: 3 Everly Rubalcava W @TODAY @1 :59 PM

## 2015-01-12 NOTE — Progress Notes (Signed)
NUTRITION FOLLOW UP  INTERVENTION: Lexicographer.  Provide Glucerna Shake po BID, each supplement provides 220 kcal and 10 grams of protein.  Provide 30 ml Prostat po once daily, each supplement provides 100 kcal and 15 grams of protein.  NUTRITION DIAGNOSIS: Increased nutrient needs related to wound healing as evidenced by estimated nutrition needs; ongoing  Goal: Pt to meet >/= 90% of their estimated nutrition needs; progressing  Monitor:  PO intake, weight trends, labs, I/O's  35 y.o. male  Admitting Dx: Dehydration  ASSESSMENT: Pt with a history of Uncontrolled HTN, DM2 who presents to the ED with complaints of diarrhea and fevers and chills x 24 hours.Pt with Diabetic Ulcers on both feet.  Pt has been advanced to a regular diet. Meal completion previously has been 100%. RD to discontinue Resource Breeze and order Glucerna Shake and prostat to aid in caloric and protein needs for wound healing.   Labs: Low sodium, potassium, calcium, GFR. High BUN and creatinine.  Height: Ht Readings from Last 1 Encounters:  01/09/15  (1.88 m)    Weight: Wt Readings from Last 1 Encounters:  01/09/15 226 lb 6.4 oz (102.694 kg)    BMI:  Body mass index is 29.06 kg/(m^2).  Re-Estimated Nutritional Needs: Kcal: 2200-2400 Protein: 110-125 grams Fluid: 2.2 - 2.4 L/day  Skin: Diabetic pressure ulcers bilaterally  Diet Order: Diet regular   Intake/Output Summary (Last 24 hours) at 01/12/15 1548 Last data filed at 01/12/15 0900  Gross per 24 hour  Intake   1620 ml  Output    750 ml  Net    870 ml    Last BM: 1/11  Labs:   Recent Labs Lab 01/09/15 0500 01/10/15 0508 01/10/15 1121 01/11/15 0235 01/11/15 0953 01/12/15 0614  NA 136 132*  --  131*  --  129*  K 3.0* 3.0*  --  3.6  --  3.0*  CL 105 103  --  100  --  98  CO2 23 23  --  21  --  21  BUN 17 21  --  29*  --  34*  CREATININE 1.86* 2.15*  --  2.52*  --  3.15*  CALCIUM 7.7* 8.0*  --   8.0*  --  7.5*  MG 1.5  --  1.6  --  1.9  --   GLUCOSE 253* 103*  --  172*  --  97    CBG (last 3)   Recent Labs  01/11/15 1653 01/11/15 2151 01/12/15 0643  GLUCAP 160* 85 82    Scheduled Meds: .  ceFAZolin (ANCEF) IV  2 g Intravenous Q12H  . [START ON 01/14/2015] DAPTOmycin (CUBICIN)  IV  600 mg Intravenous Q48H  . enoxaparin (LOVENOX) injection  40 mg Subcutaneous Daily  . feeding supplement (RESOURCE BREEZE)  1 Container Oral BID BM  . Influenza vac split quadrivalent PF  0.5 mL Intramuscular Tomorrow-1000  . insulin aspart  0-15 Units Subcutaneous TID WC  . insulin aspart  0-5 Units Subcutaneous QHS  . insulin aspart protamine- aspart  20 Units Subcutaneous BID WC  . living well with diabetes book   Does not apply Once  . metoprolol tartrate  25 mg Oral BID  . neomycin-bacitracin-polymyxin   Topical Daily  . nicotine  7 mg Transdermal Daily  . sodium chloride  500 mL Intravenous Once    Continuous Infusions: . sodium chloride 150 mL/hr at 01/11/15 1307    Past Medical History  Diagnosis Date  .  Diabetes mellitus   . Hypertension     Past Surgical History  Procedure Laterality Date  . I&d extremity Bilateral 01/16/2014    Procedure:  DEBRIDEMENT of bilateral foot ulcers;  Surgeon: Kathryne Hitchhristopher Y Blackman, MD;  [facility];  Service: Orthopedics;  Laterality: Bilateral;    Marijean NiemannStephanie La, MS, RD, LDN Pager # [phone] After hours/ weekend pager # [phone]

## 2015-01-12 NOTE — Progress Notes (Signed)
Pt called for RN c/o chest pains. Patient stated his pain is 8/10 and is over his left chest. Pain is non radiating and patient initially stated his pain feeling "bubbly". There is no pain relief in position change. Patient was given 1mg  Dilaudid IV at that time. VSS, BP 114/67, HR91. EKG showed STEMI. [facility] was notified as well as attending MD and cardiologist MD. Rapid response and Clydie BraunKaren NP compared new reading with prior EKG reading which didn't show any significant changes. Orders given for 324ASA x1 and 02@2L . Patient is falling asleep and stated his pain is better but chest still feels sore. Report given to morning nurse. Will continue to monitor patient.

## 2015-01-12 NOTE — Progress Notes (Signed)
[employer] notified of troponin level 0.84 stated they would be making rounds

## 2015-01-12 NOTE — Progress Notes (Addendum)
[facility] PROGRESS NOTE  Dwayne [name] AVW:[id number] DOB: September 08, 1980 DOA: 01/09/2015 PCP: No PCP Per Patient  Assessment/Plan: Active Problems:   Tobacco abuse   Noncompliance   Dehydration   Diarrhea   Diabetes type 2, uncontrolled   Hyperglycemia   Uncontrolled hypertension   Diabetic foot ulcers   Pain   Diabetic ulcer of left foot associated with type 2 diabetes mellitus   Diabetic ulcer of right foot associated with type 2 diabetes mellitus   Osteomyelitis   Shoulder pain   Thigh pain, musculoskeletal   Septic shock   Staphylococcus aureus bacteremia with sepsis   Smoker   Elevated troponin   staph aureus bacteremia with septic shock renal failure, + troponins, with mx possible sources and or metastatic sites of infection including his RIGHT SHOULDER, bilateral FEET, left thigh Initially on broad-spectrum antibiotics vancomycin and Zosyn 1/12 Now switched to daptomycin plus Ancef? by infectious disease Dr. Drucilla Schmidt Repeat blood cultures, lactic acid normal  influenza PCR negative  respiratory panel negative UA negative MRI both feet as per infectious disease recommendations No evidence of osteomyelitis MRI of the right shoulder, shows effusion with possible synovitis We will consult orthopedics to to consult IR to aspirate the right shoulder MRI left thigh -Myositis of the proximal vastus lateralis, probably infectious.     Bacteremia with possible endocarditis 2-D echo recommends A cardiac MRI should be considered for the evaluation of infiltrative cardiomyopathy. Discuss with cardiology, they will arrange for the appropriate study and also arrange for TEE tomorrow Abnormal cardiac enzymes  Continue Lopressor Nothing by mouth past midnight for TEE tomorrow   Diarrhea C. difficile negative   Poorly controlled diabetes Hemoglobin A1c 8.1 Patient admits to being noncompliant with medications Continue current insulin regimen Diabetes coordinator  consult   Hypertension Placed on Lopressor BID, hydralazine as needed  Acute renal failure-prerenal vs vancomycin toxicity Likely prerenal with a baseline creatinine of 1.24 Creatinine worsening, nephrology consulted-Dr.webb Will order renal ultrasound Increase IV hydration to see this improves if not the patient will need further workup    Hypokalemia replete    Code Status: full Family Communication: family updated about patient's clinical progress Disposition Plan:  As above    Brief narrative: Dwayne [name] is a 35 y.o. male with a history of Uncontrolled HTN, DM2 who presents to the ED with complaints of diarrhea and fevers and chills x 24 hours. He reports having weakness and fatigue and He denies any nausea or vomiting. No one is sick at home, and he denies taking any recent antibiotic therapy. He reports that he has been out of his medications for months, and has not seen his PCP in months as well. 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   Consultants:  None  Procedures:  None  Antibiotics: Flagyl discontinued < 1/11 Vancomycin and Zosyn > 1/11  HPI/Subjective: Patient tachycardic, hypertensive, febrile Diarrhea has improved, still complaining of nausea and vomiting Nausea improved , Denies significant right shoulder pain   Objective: Filed Vitals:   01/11/15 1352 01/11/15 2019 01/12/15 0012 01/12/15 0538  BP: 125/66 127/68 98/61 114/67  Pulse: 100 109 101 91  Temp: 98.6 F (37 C) 100.4 F (38 C) 98.8 F (37.1 C) 99.3 F (37.4 C)  TempSrc:  Oral Oral Oral  Resp: 20 20 17 20   Height:      Weight:      SpO2: 99% 93%  96% 94%    Intake/Output Summary (Last 24 hours) at 01/12/15 1334 Last data filed at 01/12/15 0900  Gross per 24 hour  Intake   1620 ml  Output    750 ml  Net    870 ml    Exam:  General: alert & oriented x 3 In NAD  Cardiovascular: RRR,  nl S1 s2  Respiratory: Decreased breath sounds at the bases, scattered rhonchi, no crackles  Abdomen: soft +BS NT/ND, no masses palpable  Extremities: No cyanosis and no edema      Data Reviewed: Basic Metabolic Panel:  Recent Labs Lab 01/09/15 0021 01/09/15 0500 01/10/15 0508 01/10/15 1121 01/11/15 0235 01/11/15 0953 01/12/15 0614  NA 135 136 132*  --  131*  --  129*  K 3.1* 3.0* 3.0*  --  3.6  --  3.0*  CL 100 105 103  --  100  --  98  CO2 26 23 23   --  21  --  21  GLUCOSE 275* 253* 103*  --  172*  --  97  BUN 16 17 21   --  29*  --  34*  CREATININE 2.06* 1.86* 2.15*  --  2.52*  --  3.15*  CALCIUM 8.1* 7.7* 8.0*  --  8.0*  --  7.5*  MG  --  1.5  --  1.6  --  1.9  --     Liver Function Tests:  Recent Labs Lab 01/09/15 0021 01/11/15 0235 01/12/15 0614  AST 25 20 25   ALT 19 23 23   ALKPHOS 86 80 81  BILITOT 0.6 0.6 0.6  PROT 6.0 6.3 5.0*  ALBUMIN 2.1* 1.7* 1.4*   No results for input(s): LIPASE, AMYLASE in the last 168 hours. No results for input(s): AMMONIA in the last 168 hours.  CBC:  Recent Labs Lab 01/09/15 0021 01/09/15 0500 01/10/15 0508 01/11/15 0235 01/12/15 0614  WBC 11.3* 10.5 13.8* 15.3* 13.4*  NEUTROABS 9.3*  --   --   --   --   HGB 11.7* 10.6* 12.2* 12.1* 10.3*  HCT 34.7* 31.8* 35.1* 34.8* 30.8*  MCV 82.6 82.4 81.6 81.3 82.1  PLT 179 141* 126* 153 169    Cardiac Enzymes:  Recent Labs Lab 01/10/15 1517 01/10/15 2036 01/11/15 0235 01/12/15 0614 01/12/15 1130  CKTOTAL  --   --   --  49  --   TROPONINI 0.36* 0.33* 0.28* 0.84* 0.65*   BNP (last 3 results) No results for input(s): PROBNP in the last 8760 hours.   CBG:  Recent Labs Lab 01/11/15 0712 01/11/15 1249 01/11/15 1653 01/11/15 2151 01/12/15 0643  GLUCAP 155* 91 160* 85 82    Recent Results (from the past 240 hour(s))  Urine culture     Status: None   Collection Time: 01/09/15  1:31 AM  Result Value Ref Range Status   Specimen Description URINE, CLEAN CATCH   Final   Special Requests NONE  Final   Colony Count   Final    3,000 COLONIES/ML Performed at [facility]    Culture   Final    INSIGNIFICANT GROWTH Performed at [facility]    Report Status 01/10/2015 FINAL  Final  Stool culture     Status: None   Collection Time: 01/09/15  4:09 AM  Result Value Ref Range Status   Specimen Description VAGINAL/RECTAL  Final   Special Requests Normal  Final   Culture   Final    NO SALMONELLA, SHIGELLA, CAMPYLOBACTER, YERSINIA,  OR E.COLI 0157:H7 ISOLATED Performed at [facility]    Report Status 01/12/2015 FINAL  Final  Clostridium Difficile by PCR     Status: None   Collection Time: 01/09/15  4:09 AM  Result Value Ref Range Status   C difficile by pcr NEGATIVE NEGATIVE Final  Culture, blood (routine x 2)     Status: None (Preliminary result)   Collection Time: 01/10/15  4:35 AM  Result Value Ref Range Status   Specimen Description BLOOD RIGHT HAND  Final   Special Requests BOTTLES DRAWN AEROBIC AND ANAEROBIC 10CC EA  Final   Culture   Final    STAPHYLOCOCCUS AUREUS Note: Culture results may be compromised due to an excessive volume of blood received in culture bottles. Gram Stain Report Called to,Read Back By and Verified With:  MONICA YIM ON 01/10/2015 AT 11:36P BY WILEJ Performed at [facility]    Report Status PENDING  Incomplete  Culture, blood (routine x 2)     Status: None (Preliminary result)   Collection Time: 01/10/15  4:46 AM  Result Value Ref Range Status   Specimen Description BLOOD LEFT ARM  Final   Special Requests BOTTLES DRAWN AEROBIC AND ANAEROBIC 10CC EA  Final   Culture   Final    STAPHYLOCOCCUS AUREUS Note: Culture results may be compromised due to an excessive volume of blood received in culture bottles. Gram Stain Report Called to,Read Back By and Verified With: MONICA YIM ON 01/10/2015 AT 11:36P BY WILEJ Performed at [facility]    Report Status PENDING  Incomplete   Respiratory virus panel (routine influenza)     Status: None   Collection Time: 01/10/15 10:34 AM  Result Value Ref Range Status   Source - RVPAN NASAL SWAB  Corrected    Comment: CORRECTED ON 01/12 AT 2036: PREVIOUSLY REPORTED AS NASAL SWAB   Respiratory Syncytial Virus A NOT DETECTED  Final   Respiratory Syncytial Virus B NOT DETECTED  Final   Influenza A NOT DETECTED  Final   Influenza B NOT DETECTED  Final   Parainfluenza 1 NOT DETECTED  Final   Parainfluenza 2 NOT DETECTED  Final   Parainfluenza 3 NOT DETECTED  Final   Metapneumovirus NOT DETECTED  Final   Rhinovirus NOT DETECTED  Final   Adenovirus NOT DETECTED  Final   Influenza A H1 NOT DETECTED  Final   Influenza A H3 NOT DETECTED  Final    Comment: (NOTE)       Normal Reference Range for each Analyte: NOT DETECTED Testing performed using the Luminex xTAG Respiratory Viral Panel test kit. The analytical performance characteristics of this assay have been determined by [facility].  The modifications have not been cleared or approved by the FDA. This assay has been validated pursuant to the CLIA regulations and is used for clinical purposes. Performed at [facility], blood (routine x 2)     Status: None (Preliminary result)   Collection Time: 01/10/15 11:19 AM  Result Value Ref Range Status   Specimen Description BLOOD LEFT FOREARM  Final   Special Requests BOTTLES DRAWN AEROBIC ONLY 5CC  Final   Culture   Final    STAPHYLOCOCCUS AUREUS Note: RIFAMPIN AND GENTAMICIN SHOULD NOT BE USED AS SINGLE DRUGS FOR TREATMENT OF STAPH INFECTIONS. Note: Gram Stain Report Called to,Read Back By and Verified With: NURSE MONICA YIM 01/11/15 6:05AM THOMI Performed at [facility]    Report Status PENDING  Incomplete  Culture, blood (routine x 2)     Status: None (Preliminary result)   Collection Time: 01/10/15 11:27 AM  Result Value Ref Range Status   Specimen Description BLOOD LEFT ANTECUBITAL   Final   Special Requests BOTTLES DRAWN AEROBIC ONLY [place]  Final   Culture   Final    STAPHYLOCOCCUS AUREUS Note: Gram Stain Report Called to,Read Back By and Verified With: NURSE MONICA YIM 01/11/15 6:05AM [place] Performed at [facility]    Report Status PENDING  Incomplete     Studies: Dg Shoulder Right  01/09/2015   CLINICAL DATA:  Anterior right shoulder pain  EXAM: RIGHT SHOULDER - 2+ VIEW  COMPARISON:  None.  FINDINGS: No fracture or dislocation is seen.  The joint spaces are preserved.  The visualized soft tissues are unremarkable.  Visualized right lung is clear.  IMPRESSION: No fracture or dislocation is seen.   Electronically Signed   By: Julian Hy M.D.   On: 01/09/2015 09:44   Mr Shoulder Right Wo Contrast  01/11/2015   CLINICAL DATA:  Right shoulder pain.  EXAM: MRI OF THE RIGHT SHOULDER WITHOUT CONTRAST  TECHNIQUE: Multiplanar, multisequence MR imaging of the shoulder was performed. No intravenous contrast was administered.  COMPARISON:  Radiographs dated 01/09/2015  FINDINGS: Rotator cuff:  Normal.  Muscles:  Normal.  Biceps long head: Properly located and intact. There is inflammation in the soft tissues around the bicipital tendon sheath with fluid in the bicipital tendon sheath.  Acromioclavicular Joint: Small degenerative cyst in the acromion. Otherwise normal AC joint. Type 1 acromion. No bursitis.  Glenohumeral Joint: There is a moderate glenohumeral joint effusion with extravasation of contrast from the subcoracoid recess of the joint into the adjacent soft tissues. Fluid distends the bicipital tendon sheath.  Labrum:  Intact.  Bones: No significant abnormalities. Tiny degenerative cystic areas in the posterior aspect of the greater tuberosity of the proximal humerus.  IMPRESSION: 1. Moderate glenohumeral joint effusion with some extravasation of joint. Fluid distends the bicipital tendon sheath with inflammation/extravasated joint fluid around the bicipital tendon  sheath. The possibly of synovitis should be considered. 2. Normal rotator cuff.   Electronically Signed   By: Rozetta Nunnery M.D.   On: 01/11/2015 11:21   Mr Foot Right Wo Contrast  01/11/2015   CLINICAL DATA:  Diabetic foot ulcers.  EXAM: MRI OF THE RIGHT FOREFOOT WITHOUT CONTRAST  TECHNIQUE: Multiplanar, multisequence MR imaging was performed. No intravenous contrast was administered.  COMPARISON:  Radiographs dated 01/09/2011  FINDINGS: There are small erosions of the proximal phalanx at the IP joint of the great toe and there are small erosions at the first metatarsal phalangeal joint, both consistent with gout. There are no soft tissue abscesses. There is no osteomyelitis. There is nonspecific edema in the subcutaneous fat of the dorsum of the foot. Muscles and tendons and ligaments appear appear normal. No joint effusions.  IMPRESSION: Subcutaneous edema primarily on the dorsum of the foot. 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   Electronically Signed   By: Rozetta Nunnery M.D.   On: 01/11/2015 12:50   Mr Foot Left Wo Contrast  01/11/2015   CLINICAL DATA:  Diabetic foot ulcers.  Foot pain.  Bony erosions.  EXAM: MRI OF THE LEFT FOREFOOT WITHOUT CONTRAST  TECHNIQUE: Multiplanar, multisequence MR imaging was performed. No intravenous contrast was administered.  COMPARISON:  Radiograph dated 01/09/2015  FINDINGS: There is slight edema throughout the medial cuneiform. There is artifactual increased signal from  the metatarsal heads in from the proximal phalanx of the great toe. There are erosions at IP joint and first metatarsal phalangeal joint consistent with gout.  There are no soft tissue abscesses and there is no osteomyelitis. There is prominent edema in the subcutaneous fat of the dorsum of the foot.  IMPRESSION: No evidence of osteomyelitis or abscess. The edema in the medial cuneiform is most likely a stress reaction. 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   Electronically  Signed   By: Rozetta Nunnery M.D.   On: 01/11/2015 12:15   Dg Chest Port 1 View  01/10/2015   CLINICAL DATA:  Fever and sweats.  EXAM: PORTABLE CHEST - 1 VIEW  COMPARISON:  None.  FINDINGS: Low lung volumes with vascular crowding and streaky subsegmental atelectasis. No obvious infiltrate or effusion. The heart is within normal limits in size given the AP portable technique. The bony structures are grossly normal.  IMPRESSION: Low lung volumes with vascular crowding and bibasilar atelectasis.   Electronically Signed   By: Kalman Jewels M.D.   On: 01/10/2015 11:27   Dg Foot Complete Left  01/09/2015   CLINICAL DATA:  35 year old male with recurrent bilateral foot pain.  EXAM: LEFT FOOT - COMPLETE 3+ VIEW  COMPARISON:  01/05/2014.  FINDINGS: Large periarticular erosions are noted adjacent to the first MTP joint, and at the first interphalangeal joint along the medial margin in both locations. These have erosions have sclerotic margins and overhanging edges, and there is adjacent soft tissue prominence, suggestive of gouty erosions. No adjacent soft tissue calcification is noted. No acute displaced fracture, subluxation or dislocation. Small heterotopic ossification adjacent to the plantar aspect of the calcaneus, likely ossification within the plantar fascia.  IMPRESSION: 1. Findings, as above, suggestive of underlying gout. Clinical correlation is recommended.   Electronically Signed   By: Vinnie Langton M.D.   On: 01/09/2015 09:46   Dg Foot Complete Right  01/09/2015   CLINICAL DATA:  Recurring bilateral foot pain  EXAM: RIGHT FOOT COMPLETE - 3+ VIEW  COMPARISON:  None.  FINDINGS: No fracture or dislocation is seen.  Marginal erosion at the medial aspect of the 1st IP joint, involving the proximal phalanx.  The visualized soft tissues are unremarkable.  IMPRESSION: Marginal erosion at the medial aspect of the 1st IP joint. Correlate for inflammatory arthropathy such as gout.  Otherwise, no acute osseus  abnormality is seen.   Electronically Signed   By: Julian Hy M.D.   On: 01/09/2015 09:44    Scheduled Meds: . DAPTOmycin (CUBICIN)  IV  600 mg Intravenous Q24H  . enoxaparin (LOVENOX) injection  40 mg Subcutaneous Daily  . feeding supplement (RESOURCE BREEZE)  1 Container Oral BID BM  . Influenza vac split quadrivalent PF  0.5 mL Intramuscular Tomorrow-1000  . insulin aspart  0-15 Units Subcutaneous TID WC  . insulin aspart  0-5 Units Subcutaneous QHS  . insulin aspart protamine- aspart  20 Units Subcutaneous BID WC  . living well with diabetes book   Does not apply Once  . metoprolol tartrate  25 mg Oral BID  . neomycin-bacitracin-polymyxin   Topical Daily  . nicotine  7 mg Transdermal Daily  . sodium chloride  500 mL Intravenous Once   Continuous Infusions: . sodium chloride 150 mL/hr at 01/11/15 1307    Active Problems:   Tobacco abuse   Noncompliance   Dehydration   Diarrhea   Diabetes type 2, uncontrolled  Hyperglycemia   Uncontrolled hypertension   Diabetic foot ulcers   Pain   Diabetic ulcer of left foot associated with type 2 diabetes mellitus   Diabetic ulcer of right foot associated with type 2 diabetes mellitus   Osteomyelitis   Shoulder pain   Thigh pain, musculoskeletal   Septic shock   Staphylococcus aureus bacteremia with sepsis   Smoker   Elevated troponin    Time spent: 40 minutes   [facility] Pager [phone]. If 7PM-7AM, please contact night-coverage at [email or web address], password [facility] 01/12/2015, 1:34 PM  LOS: 3 days

## 2015-01-12 NOTE — Progress Notes (Signed)
[facility]    Subjective: Patient being wheeled off for renal ultrasound   Antibiotics:  Anti-infectives    Start     Dose/Rate Route Frequency Ordered Stop   01/14/15 1500  DAPTOmycin (CUBICIN) 600 mg in sodium chloride 0.9 % IVPB     600 mg224 mL/hr over 30 Minutes Intravenous Every 48 hours 01/12/15 1421     01/12/15 1600  ceFAZolin (ANCEF) IVPB 2 g/50 mL premix     2 g100 mL/hr over 30 Minutes Intravenous Every 12 hours 01/12/15 1419     01/11/15 1200  DAPTOmycin (CUBICIN) 600 mg in sodium chloride 0.9 % IVPB  Status:  Discontinued     600 mg224 mL/hr over 30 Minutes Intravenous Every 24 hours 01/11/15 1003 01/12/15 1421   01/10/15 1200  vancomycin (VANCOCIN) IVPB 1000 mg/200 mL premix  Status:  Discontinued     1,000 mg200 mL/hr over 60 Minutes Intravenous Every 8 hours 01/10/15 1044 01/11/15 0947   01/10/15 1100  piperacillin-tazobactam (ZOSYN) IVPB 3.375 g  Status:  Discontinued     3.375 g12.5 mL/hr over 240 Minutes Intravenous Every 8 hours 01/10/15 1043 01/11/15 0902   01/09/15 0700  metroNIDAZOLE (FLAGYL) IVPB 500 mg  Status:  Discontinued     500 mg100 mL/hr over 60 Minutes Intravenous Every 8 hours 01/09/15 0651 01/10/15 1018      Medications: Scheduled Meds: .  ceFAZolin (ANCEF) IV  2 g Intravenous Q12H  . [START ON 01/14/2015] DAPTOmycin (CUBICIN)  IV  600 mg Intravenous Q48H  . enoxaparin (LOVENOX) injection  40 mg Subcutaneous Daily  . feeding supplement (GLUCERNA SHAKE)  237 mL Oral BID BM  . feeding supplement (PRO-STAT SUGAR FREE 64)  30 mL Oral Q1500  . Influenza vac split quadrivalent PF  0.5 mL Intramuscular Tomorrow-1000  . insulin aspart  0-15 Units Subcutaneous TID WC  . insulin aspart  0-5 Units Subcutaneous QHS  . insulin aspart protamine- aspart  20 Units Subcutaneous BID WC  . living well with diabetes book   Does not apply Once  . metoprolol tartrate  25 mg Oral BID  . neomycin-bacitracin-polymyxin   Topical Daily    . nicotine  7 mg Transdermal Daily  . sodium chloride  500 mL Intravenous Once   Continuous Infusions: . sodium chloride 150 mL/hr at 01/11/15 1307   PRN Meds:.acetaminophen **OR** acetaminophen, alum & mag hydroxide-simeth, hydrALAZINE, HYDROmorphone (DILAUDID) injection, ondansetron **OR** ondansetron (ZOFRAN) IV, oxyCODONE    Objective: Weight change:   Intake/Output Summary (Last 24 hours) at 01/12/15 1817 Last data filed at 01/12/15 1607  Gross per 24 hour  Intake    882 ml  Output    950 ml  Net    -68 ml   Blood pressure 135/76, pulse 102, temperature 98.6 F (37 C), temperature source Oral, resp. rate 18, height 6' 2"  (1.88 m), weight 226 lb 6.4 oz (102.694 kg), SpO2 97 %. Temp:  [98.6 F (37 C)-100.4 F (38 C)] 98.6 F (37 C) (01/13 1600) Pulse Rate:  [91-109] 102 (01/13 1600) Resp:  [17-20] 18 (01/13 1600) BP: (98-135)/(61-76) 135/76 mmHg (01/13 1600) SpO2:  [93 %-97 %] 97 % (01/13 1600)  Physical Exam: General: Alert and awake, oriented x3, not in any acute distress. He was in his going for renal ultrasound CBC:   CBC Latest Ref Rng 01/12/2015 01/11/2015 01/10/2015  WBC 4.0 - 10.5 K/uL 13.4(H) 15.3(H) 13.8(H)  Hemoglobin 13.0 - 17.0 g/dL 10.3(L) 12.1(L) 12.2(L)  Hematocrit  39.0 - 52.0 % 30.8(L) 34.8(L) 35.1(L)  Platelets 150 - 400 K/uL 169 153 126(L)     BMET  Recent Labs  01/11/15 0235 01/12/15 0614  NA 131* 129*  K 3.6 3.0*  CL 100 98  CO2 21 21  GLUCOSE 172* 97  BUN 29* 34*  CREATININE 2.52* 3.15*  CALCIUM 8.0* 7.5*     Liver Panel   Recent Labs  01/11/15 0235 01/12/15 0614  PROT 6.3 5.0*  ALBUMIN 1.7* 1.4*  AST 20 25  ALT 23 23  ALKPHOS 80 81  BILITOT 0.6 0.6       Sedimentation Rate  Recent Labs  01/12/15 0614  ESRSEDRATE 111*   C-Reactive Protein No results for input(s): CRP in the last 72 hours.  Micro Results: Recent Results (from the past 720 hour(s))  Urine culture     Status: None   Collection Time:  01/09/15  1:31 AM  Result Value Ref Range Status   Specimen Description URINE, CLEAN CATCH  Final   Special Requests NONE  Final   Colony Count   Final    3,000 COLONIES/ML Performed at [facility]    Culture   Final    INSIGNIFICANT GROWTH Performed at [facility]    Report Status 01/10/2015 FINAL  Final  Stool culture     Status: None   Collection Time: 01/09/15  4:09 AM  Result Value Ref Range Status   Specimen Description VAGINAL/RECTAL  Final   Special Requests Normal  Final   Culture   Final    NO SALMONELLA, SHIGELLA, CAMPYLOBACTER, YERSINIA, OR E.COLI 0157:H7 ISOLATED Performed at [facility]    Report Status 01/12/2015 FINAL  Final  Clostridium Difficile by PCR     Status: None   Collection Time: 01/09/15  4:09 AM  Result Value Ref Range Status   C difficile by pcr NEGATIVE NEGATIVE Final  Culture, blood (routine x 2)     Status: None (Preliminary result)   Collection Time: 01/10/15  4:35 AM  Result Value Ref Range Status   Specimen Description BLOOD RIGHT HAND  Final   Special Requests BOTTLES DRAWN AEROBIC AND ANAEROBIC 10CC EA  Final   Culture   Final    STAPHYLOCOCCUS AUREUS Note: Culture results may be compromised due to an excessive volume of blood received in culture bottles. Gram Stain Report Called to,Read Back By and Verified With:  MONICA YIM ON 01/10/2015 AT 11:36P BY WILEJ Performed at [facility]    Report Status PENDING  Incomplete  Culture, blood (routine x 2)     Status: None (Preliminary result)   Collection Time: 01/10/15  4:46 AM  Result Value Ref Range Status   Specimen Description BLOOD LEFT ARM  Final   Special Requests BOTTLES DRAWN AEROBIC AND ANAEROBIC 10CC EA  Final   Culture   Final    STAPHYLOCOCCUS AUREUS Note: Culture results may be compromised due to an excessive volume of blood received in culture bottles. Gram Stain Report Called to,Read Back By and Verified With: MONICA YIM ON 01/10/2015 AT  11:36P BY WILEJ Performed at [facility]    Report Status PENDING  Incomplete  Respiratory virus panel (routine influenza)     Status: None   Collection Time: 01/10/15 10:34 AM  Result Value Ref Range Status   Source - RVPAN NASAL SWAB  Corrected    Comment: CORRECTED ON 01/12 AT 2036: PREVIOUSLY REPORTED AS NASAL SWAB   Respiratory Syncytial Virus  A NOT DETECTED  Final   Respiratory Syncytial Virus B NOT DETECTED  Final   Influenza A NOT DETECTED  Final   Influenza B NOT DETECTED  Final   Parainfluenza 1 NOT DETECTED  Final   Parainfluenza 2 NOT DETECTED  Final   Parainfluenza 3 NOT DETECTED  Final   Metapneumovirus NOT DETECTED  Final   Rhinovirus NOT DETECTED  Final   Adenovirus NOT DETECTED  Final   Influenza A H1 NOT DETECTED  Final   Influenza A H3 NOT DETECTED  Final    Comment: (NOTE)       Normal Reference Range for each Analyte: NOT DETECTED Testing performed using the Luminex xTAG Respiratory Viral Panel test kit. The analytical performance characteristics of this assay have been determined by [facility].  The modifications have not been cleared or approved by the FDA. This assay has been validated pursuant to the CLIA regulations and is used for clinical purposes. Performed at [facility], blood (routine x 2)     Status: None (Preliminary result)   Collection Time: 01/10/15 11:19 AM  Result Value Ref Range Status   Specimen Description BLOOD LEFT FOREARM  Final   Special Requests BOTTLES DRAWN AEROBIC ONLY 5CC  Final   Culture   Final    STAPHYLOCOCCUS AUREUS Note: RIFAMPIN AND GENTAMICIN SHOULD NOT BE USED AS SINGLE DRUGS FOR TREATMENT OF STAPH INFECTIONS. Note: Gram Stain Report Called to,Read Back By and Verified With: NURSE MONICA YIM 01/11/15 6:05AM THOMI Performed at [facility]    Report Status PENDING  Incomplete  Culture, blood (routine x 2)     Status: None (Preliminary result)   Collection Time: 01/10/15  11:27 AM  Result Value Ref Range Status   Specimen Description BLOOD LEFT ANTECUBITAL  Final   Special Requests BOTTLES DRAWN AEROBIC ONLY [place]  Final   Culture   Final    STAPHYLOCOCCUS AUREUS Note: Gram Stain Report Called to,Read Back By and Verified With: NURSE MONICA YIM 01/11/15 6:05AM [place] Performed at [facility]    Report Status PENDING  Incomplete    Studies/Results: US Renal  01/12/2015   CLINICAL DATA:  Acute renal failure  EXAM: RENAL/URINARY TRACT ULTRASOUND COMPLETE  COMPARISON:  None.  FINDINGS: Right Kidney:  Length: 12.8 cm in length. Diffuse increased cortical echogenicity suspicious for medical renal disease. No hydronephrosis. No renal calculus.  Left Kidney:  Length: . 12.6 cm in length. Diffuse increased echogenicity suspicious for medical renal disease. No hydronephrosis. No diagnostic renal calculus.  Bladder:  Appears normal for degree of bladder distention.  IMPRESSION: Bilateral kidneys shows diffuse increased echogenicity. Medical renal disease cannot be excluded. No hydronephrosis or diagnostic renal calculus.   Electronically Signed   By: Lahoma Crocker M.D.   On: 01/12/2015 16:08   Mr Shoulder Right Wo Contrast  01/11/2015   CLINICAL DATA:  Right shoulder pain.  EXAM: MRI OF THE RIGHT SHOULDER WITHOUT CONTRAST  TECHNIQUE: Multiplanar, multisequence MR imaging of the shoulder was performed. No intravenous contrast was administered.  COMPARISON:  Radiographs dated 01/09/2015  FINDINGS: Rotator cuff:  Normal.  Muscles:  Normal.  Biceps long head: Properly located and intact. There is inflammation in the soft tissues around the bicipital tendon sheath with fluid in the bicipital tendon sheath.  Acromioclavicular Joint: Small degenerative cyst in the acromion. Otherwise normal AC joint. Type 1 acromion. No bursitis.  Glenohumeral Joint: There is a moderate glenohumeral joint effusion with extravasation  of contrast from the subcoracoid recess of the joint into the  adjacent soft tissues. Fluid distends the bicipital tendon sheath.  Labrum:  Intact.  Bones: No significant abnormalities. Tiny degenerative cystic areas in the posterior aspect of the greater tuberosity of the proximal humerus.  IMPRESSION: 1. Moderate glenohumeral joint effusion with some extravasation of joint. Fluid distends the bicipital tendon sheath with inflammation/extravasated joint fluid around the bicipital tendon sheath. The possibly of synovitis should be considered. 2. Normal rotator cuff.   Electronically Signed   By: Rozetta Nunnery M.D.   On: 01/11/2015 11:21   Mr Foot Right Wo Contrast  01/11/2015   CLINICAL DATA:  Diabetic foot ulcers.  EXAM: MRI OF THE RIGHT FOREFOOT WITHOUT CONTRAST  TECHNIQUE: Multiplanar, multisequence MR imaging was performed. No intravenous contrast was administered.  COMPARISON:  Radiographs dated 01/09/2011  FINDINGS: There are small erosions of the proximal phalanx at the IP joint of the great toe and there are small erosions at the first metatarsal phalangeal joint, both consistent with gout. There are no soft tissue abscesses. There is no osteomyelitis. There is nonspecific edema in the subcutaneous fat of the dorsum of the foot. Muscles and tendons and ligaments appear appear normal. No joint effusions.  IMPRESSION: Subcutaneous edema primarily on the dorsum of the foot. 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   Electronically Signed   By: Rozetta Nunnery M.D.   On: 01/11/2015 12:50   Mr Femur Left Wo Contrast  01/12/2015   CLINICAL DATA:  Diabetes. Peripheral neuropathy. Sepsis. Bilateral thigh pain. RIGHT thigh pain. Tenderness in the medial aspect of the thigh.  EXAM: MR OF THE LEFT FEMUR WITHOUT CONTRAST  TECHNIQUE: Multiplanar, multisequence MR imaging was performed. No intravenous contrast was administered.  COMPARISON:  None.  FINDINGS: Diffuse subcutaneous edema is present around the pelvis and in both thighs, most compatible with anasarca. This could also represent  cellulitis in the appropriate clinical setting. Subcutaneous fluid is present in the lateral LEFT thigh, without a discrete abscess. There is myositis of the LEFT vastus lateralis with mild muscular edema av but no significant swelling. No fatty atrophy.  Bone marrow signal shows heterogenous marrow. This is a nonspecific finding most commonly associated with obesity, anemia, cigarette smoking or chronic disease. Negative for hip fracture or AVN. There is no evidence of osteomyelitis. No deep soft tissue abscesses are present.  Bilaterally in the groin, there are prominent lymph nodes which are probably reactive to lower extremity process.  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  IMPRESSION: 1. Diffuse subcutaneous edema in the pelvis and thighs bilaterally, suggesting anasarca. 2. More prominent subcutaneous edema along the LEFT thigh with subcutaneous fluid superficial to the muscular fascia, likely representing cellulitis. No abscess. 3. Myositis of the proximal vastus lateralis, probably infectious. Reactive edema could produce this appearance as well. 4. Reactive inguinal adenopathy bilaterally. 5. Negative for osteomyelitis.   Electronically Signed   By: Dereck Ligas M.D.   On: 01/12/2015 14:33   Mr Foot Left Wo Contrast  01/11/2015   CLINICAL DATA:  Diabetic foot ulcers.  Foot pain.  Bony erosions.  EXAM: MRI OF THE LEFT FOREFOOT WITHOUT CONTRAST  TECHNIQUE: Multiplanar, multisequence MR imaging was performed. No intravenous contrast was administered.  COMPARISON:  Radiograph dated 01/09/2015  FINDINGS: There is slight edema throughout the medial cuneiform. There is artifactual increased signal from the metatarsal heads in from the proximal phalanx of the great  toe. There are erosions at IP joint and first metatarsal phalangeal joint consistent with gout.  There are no soft tissue abscesses and there is no osteomyelitis. There  is prominent edema in the subcutaneous fat of the dorsum of the foot.  IMPRESSION: No evidence of osteomyelitis or abscess. The edema in the medial cuneiform is most likely a stress reaction. 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   Electronically Signed   By: Rozetta Nunnery M.D.   On: 01/11/2015 12:15      Assessment/Plan:  Active Problems:   Tobacco abuse   Noncompliance   Dehydration   Diarrhea   Diabetes type 2, uncontrolled   Hyperglycemia   Uncontrolled hypertension   Diabetic foot ulcers   Pain   Diabetic ulcer of left foot associated with type 2 diabetes mellitus   Diabetic ulcer of right foot associated with type 2 diabetes mellitus   Osteomyelitis   Shoulder pain   Thigh pain, musculoskeletal   Septic shock   Staphylococcus aureus bacteremia with sepsis   Smoker   Elevated troponin   Acute renal failure   Bacteremia    Dwayne [name] is a 35 y.o. male with Staphylococcus aureus bacteremia shock renal failure positive troponins pericardial effusion right sided shoulder effusion highly likely to be septic shoulder, left vastus medialis myositis and diabetic foot ulcers that themselves do not appear grossly infected.  #1      [facility] Staphylococcus aureus bacteremia   Staphylococcus aureus bacteremia (SAB) is associated with a high rate of complications and mortality.  Specific aspects of clinical management are critical to optimizing the outcome of patients with SAB.  Therefore, the [facility] Antimicrobial Management Team [facility]) has initiated an intervention aimed at improving the management of SAB at [facility].  To do so, Infectious Diseases physicians are providing an evidence-based consult for the management of all patients with SAB.     Yes No Comments  Perform follow-up blood cultures (even if the patient is afebrile) to ensure clearance of bacteremia []  []   repeat blood cultures have  been obtained   Remove vascular catheter and obtain follow-up blood cultures after the removal of the catheter []  []   do not place central access until we have cleared his bacteremia   Perform echocardiography to evaluate for endocarditis (transthoracic ECHO is 40-50% sensitive, TEE is > 90% sensitive) []  []  Please keep in mind, that neither test can definitively EXCLUDE endocarditis, and that should clinical suspicion remain high for endocarditis the patient should then still be treated with an "endocarditis" duration of therapy = 6 weeks  Have discussed with Dr. Marlou Porch and transesophageal echocardiogram is being planned   Consult electrophysiologist to evaluate implanted cardiac device (pacemaker, ICD) []  []  NA  Ensure source control []  []  Have all abscesses been drained effectively? Have deep seeded infections (septic joints or osteomyelitis) had appropriate surgical debridement?   NO, I AM CONFIDENT HIS SHOULDER IS INFECTED WITH STAPHYLOCOCCUS AUREUS, AND CERTAINLY HE HAS ALSO EVIDENCE OF MYOSITIS OF LEFT SIDE  HE NEEDS ORTHOPEDIC ATTENTION TO HIS RIGHT SHOULDER. IF SHOULDER JOINT SENT FOR ASPIRATE, CELL COUNT TRUMPS ALL OTHER ANALYSES AS GRAM STAIN AND CULTURE ARE NOT RELIABLE IN PT ALREADY ON ABX  I WOULD ALSO RECOMMEND IMAGINE HIS RIGHT THIGH THOUGH PAIN WAS LESS THERE  I SUSPECT HE WILL EVENTUALLY HAVE A "BLOOMING" PYOMYOSITIS IN LEFT AND POSSIBLY RIGHT SIDE  FINALLY WE WILL NEED TO SEE WHAT CARDIOLOGY  MAKES OF HIS PERICARDIAL EFFUSION COULD THIS BE A STAPH AUREUS PURULENT PERICARDITIS?   Investigate for "metastatic" sites of infection []  []  Does the patient have ANY symptom or physical exam finding that would suggest a deeper infection (back or neck pain that may be suggestive of vertebral osteomyelitis or epidural abscess, muscle pain that could be a symptom of pyomyositis)?  Keep in mind that for deep seeded infections MRI imaging with contrast is preferred rather than other often  insensitive tests such as plain x-rays, especially early in a patient's presentation.SEE ABOVE DISCUSSION  Change antibiotic therapy to daptomycin's and cefazolin pending sensitivity  []  []  Beta-lactam antibiotics are preferred for MSSA due to higher cure rates.   If on Vancomycin, goal trough should be 15 - 20 mcg/mL  Estimated duration of IV antibiotic therapy:     8 WEEKS  []  []  Consult case management for probably prolonged outpatient IV antibiotic therapy       LOS: 3 days   Alcide Evener 01/12/2015, 6:17 PM

## 2015-01-12 NOTE — Progress Notes (Signed)
RN paged this am secondary to pt having CP and stat 12 lead EKG computer report read "STEMI". NP to floor. EKG does not show a STEMI. When compared to prior EKG, leads are different.  S: Pt endorses 2/10 dull CP over left nipple. No n/v, radiation of pain to jaw or arm. No SOB. Started about 30 mins ago. Not an enthusiastic historian. O: Appears well, oriented. VS reviewed. Pain is non reproducible. RRR. Normal resp effort. Skin warm, dry.  A/P: 1. Chest pain, atypical. Will recycle trops. Has already had set since here and had trended down as of yesterday. O2 at 2L for now, can be removed later. ASA 324mg  given. Saw no need for nitro as pt's pain had dissipated by the time NP arrived. [place] cardio fellow, and he came to unit to see pt and review EKG. He also agrees that there is no evidence of STEMI. Cardio is already following pt daily.  Jimmye NormanKaren Kirby-Graham, NP [facility]

## 2015-01-12 NOTE — Consult Note (Signed)
  [name] is actually someone that I have seen before for bilateral diabetic foot ulcers.  I was able to debride them down in my office over time and eventually got them to heal significantly except for callus tissue.  It has been a while since I have seen Dwayne [name].  He is now hospitalized with bacteremia and sepsis.  Ortho is consulted to assess his right shoulder due to pain and an effusion of the joint on MRI.  He also needs to be assessed from an Ortho standpoint to see what else may be contributing to his decline in health.  He reports chest pain, bilateral thigh pain, bilateral foot pain, and some right shoulder pain.  However, he does say his shoulder pain as lessened.  His right shoulder on my exam is pretty unremarkable.  I can put his shoulder thru passive motion with no significant pain.  There is no redness or swelling and no significant pain on palpation or when compressing his joint.  His MRI shows a joint effusion, but no specific abscess or tear.  He can lift his shoulder on his own.  This does not have the clinical presentation of a septic shoulder.  A fluoro-guided aspiration is pending.  His bilateral foot ulcers show abundant callus tissue, no open wounds, and no drainage or redness and his bilateral foot MRI's are unremarkable.  Both thighs show no redness or fluctuant areas.  The only area he is tender on my exam of his thighs is his left inner thigh.  I see no gross findings in the area.  I will continue to follow for now and have no further recs for now.

## 2015-01-13 ENCOUNTER — Encounter (HOSPITAL_COMMUNITY)
Admission: EM | Disposition: A | Discharge status: Home Health | Payer: Self-pay | Source: Home / Self Care | Attending: Internal Medicine

## 2015-01-13 ENCOUNTER — Encounter (HOSPITAL_COMMUNITY): Payer: Self-pay

## 2015-01-13 ENCOUNTER — Inpatient Hospital Stay (HOSPITAL_COMMUNITY): Payer: Medicaid Other

## 2015-01-13 DIAGNOSIS — I340 Nonrheumatic mitral (valve) insufficiency: Secondary | ICD-10-CM

## 2015-01-13 DIAGNOSIS — B958 Unspecified staphylococcus as the cause of diseases classified elsewhere: Secondary | ICD-10-CM | POA: Diagnosis present

## 2015-01-13 DIAGNOSIS — I301 Infective pericarditis: Secondary | ICD-10-CM | POA: Insufficient documentation

## 2015-01-13 DIAGNOSIS — I313 Pericardial effusion (noninflammatory): Secondary | ICD-10-CM

## 2015-01-13 DIAGNOSIS — I330 Acute and subacute infective endocarditis: Secondary | ICD-10-CM

## 2015-01-13 HISTORY — DX: Nonrheumatic mitral (valve) insufficiency: I34.0

## 2015-01-13 HISTORY — PX: TEE WITHOUT CARDIOVERSION: SHX5443

## 2015-01-13 LAB — CBC
HCT: 31.4 % — ABNORMAL LOW (ref 39.0–52.0)
Hemoglobin: 10.6 g/dL — ABNORMAL LOW (ref 13.0–17.0)
MCH: 27.8 pg (ref 26.0–34.0)
MCHC: 33.8 g/dL (ref 30.0–36.0)
MCV: 82.4 fL (ref 78.0–100.0)
Platelets: 243 10*3/uL (ref 150–400)
RBC: 3.81 MIL/uL — ABNORMAL LOW (ref 4.22–5.81)
RDW: 13.7 % (ref 11.5–15.5)
WBC: 13.4 10*3/uL — ABNORMAL HIGH (ref 4.0–10.5)

## 2015-01-13 LAB — CULTURE, BLOOD (ROUTINE X 2)

## 2015-01-13 LAB — COMPREHENSIVE METABOLIC PANEL
ALBUMIN: 1.4 g/dL — AB (ref 3.5–5.2)
ALK PHOS: 151 U/L — AB (ref 39–117)
ALT: 21 U/L (ref 0–53)
AST: 23 U/L (ref 0–37)
Anion gap: 11 (ref 5–15)
BILIRUBIN TOTAL: 0.2 mg/dL — AB (ref 0.3–1.2)
BUN: 37 mg/dL — AB (ref 6–23)
CALCIUM: 8 mg/dL — AB (ref 8.4–10.5)
CO2: 22 mmol/L (ref 19–32)
CREATININE: 3.05 mg/dL — AB (ref 0.50–1.35)
Chloride: 101 mEq/L (ref 96–112)
GFR calc Af Amer: 29 mL/min — ABNORMAL LOW (ref >90)
GFR calc non Af Amer: 25 mL/min — ABNORMAL LOW (ref >90)
Glucose, Bld: 123 mg/dL — ABNORMAL HIGH (ref 70–99)
Potassium: 3.1 mmol/L — ABNORMAL LOW (ref 3.5–5.1)
Sodium: 134 mmol/L — ABNORMAL LOW (ref 135–145)
Total Protein: 5.7 g/dL — ABNORMAL LOW (ref 6.0–8.3)

## 2015-01-13 LAB — GLUCOSE, CAPILLARY
Glucose-Capillary: 139 mg/dL — ABNORMAL HIGH (ref 70–99)
Glucose-Capillary: 85 mg/dL (ref 70–99)
Glucose-Capillary: 186 mg/dL — ABNORMAL HIGH (ref 70–99)
Glucose-Capillary: 273 mg/dL — ABNORMAL HIGH (ref 70–99)

## 2015-01-13 LAB — MAGNESIUM: Magnesium: 2.3 mg/dL (ref 1.5–2.5)

## 2015-01-13 SURGERY — ECHOCARDIOGRAM, TRANSESOPHAGEAL
Anesthesia: Moderate Sedation

## 2015-01-13 MED ORDER — MIDAZOLAM HCL 5 MG/ML IJ SOLN
INTRAMUSCULAR | Status: AC
  Filled 2015-01-13: qty 2

## 2015-01-13 MED ORDER — FENTANYL CITRATE 0.05 MG/ML IJ SOLN
INTRAMUSCULAR | Status: DC | PRN
  Administered 2015-01-13: 50 ug via INTRAVENOUS

## 2015-01-13 MED ORDER — POTASSIUM CHLORIDE IN NACL 20-0.9 MEQ/L-% IV SOLN
INTRAVENOUS | Status: DC
  Administered 2015-01-13: 15:00:00 via INTRAVENOUS
  Filled 2015-01-13 (×3): qty 1000

## 2015-01-13 MED ORDER — POTASSIUM CHLORIDE IN NACL 40-0.9 MEQ/L-% IV SOLN
INTRAVENOUS | Status: DC
  Administered 2015-01-13: 75 mL/h via INTRAVENOUS
  Administered 2015-01-15 – 2015-01-16 (×4): 100 mL/h via INTRAVENOUS
  Filled 2015-01-13 (×12): qty 1000

## 2015-01-13 MED ORDER — FENTANYL CITRATE 0.05 MG/ML IJ SOLN
INTRAMUSCULAR | Status: AC
Start: 2015-01-13 — End: 2015-01-13
  Filled 2015-01-13: qty 2

## 2015-01-13 MED ORDER — MIDAZOLAM HCL 10 MG/2ML IJ SOLN
INTRAMUSCULAR | Status: DC | PRN
  Administered 2015-01-13 (×2): 2 mg via INTRAVENOUS

## 2015-01-13 MED ORDER — CEFAZOLIN SODIUM-DEXTROSE 2-3 GM-% IV SOLR
2.0000 g | Freq: Three times a day (TID) | INTRAVENOUS | Status: DC
  Administered 2015-01-13 – 2015-01-14 (×5): 2 g via INTRAVENOUS
  Filled 2015-01-13 (×9): qty 50

## 2015-01-13 MED ORDER — BUTAMBEN-TETRACAINE-BENZOCAINE 2-2-14 % EX AERO
INHALATION_SPRAY | CUTANEOUS | Status: DC | PRN
  Administered 2015-01-13: 2 via TOPICAL

## 2015-01-13 NOTE — Progress Notes (Signed)
[facility] ROUNDING NOTE   Subjective:   Interval History:  No complaints this morning  Objective:  Vital signs in last 24 hours:  Temp:  [98.6 F (37 C)-99.9 F (37.7 C)] 99.3 F (37.4 C) (01/14 0529) Pulse Rate:  [97-108] 97 (01/14 0529) Resp:  [17-19] 17 (01/14 0529) BP: (118-149)/(76-83) 118/76 mmHg (01/14 0529) SpO2:  [95 %-100 %] 95 % (01/14 0529)  Weight change:  Filed Weights   01/09/15 0017 01/09/15 0329  Weight: 99.791 kg (220 lb) 102.694 kg (226 lb 6.4 oz)    Intake/Output: I/O last 3 completed shifts: In: 7262 [P.O.:1140; IV Piggyback:162] Out: 850 [Urine:850]   Intake/Output this shift:     CVS- RRR  Systolic murmur RS- CTA ABD- BS present soft non-distended EXT- no edema  Ulcerations on Plantar Surface of Both Feet.   Basic Metabolic Panel:  Recent Labs Lab 01/09/15 0500 01/10/15 0508 01/10/15 1121 01/11/15 0235 01/11/15 0953 01/12/15 0614 01/13/15 0503  NA 136 132*  --  131*  --  129* 134*  K 3.0* 3.0*  --  3.6  --  3.0* 3.1*  CL 105 103  --  100  --  98 101  CO2 23 23  --  21  --  21 22  GLUCOSE 253* 103*  --  172*  --  97 123*  BUN 17 21  --  29*  --  34* 37*  CREATININE 1.86* 2.15*  --  2.52*  --  3.15* 3.05*  CALCIUM 7.7* 8.0*  --  8.0*  --  7.5* 8.0*  MG 1.5  --  1.6  --  1.9  --   --     Liver Function Tests:  Recent Labs Lab 01/09/15 0021 01/11/15 0235 01/12/15 0614 01/13/15 0503  AST _0 ALT _1 ALKPHOS 86 80 81 151*  BILITOT 0.6 0.6 0.6 0.2*  PROT 6.0 6.3 5.0* 5.7*  ALBUMIN 2.1* 1.7* 1.4* 1.4*   No results for input(s): LIPASE, AMYLASE in the last 168 hours. No results for input(s): AMMONIA in the last 168 hours.  CBC:  Recent Labs Lab 01/09/15 0021 01/09/15 0500 01/10/15 0508 01/11/15 0235 01/12/15 0614 01/13/15 0503  WBC 11.3* 10.5 13.8* 15.3* 13.4* 13.4*  NEUTROABS 9.3*  --   --   --   --   --   HGB 11.7* 10.6* 12.2* 12.1* 10.3* 10.6*  HCT 34.7* 31.8* 35.1* 34.8*  30.8* 31.4*  MCV 82.6 82.4 81.6 81.3 82.1 82.4  PLT 179 141* 126* 153 169 243    Cardiac Enzymes:  Recent Labs Lab 01/10/15 2036 01/11/15 0235 01/12/15 0614 01/12/15 1130 01/12/15 1849  CKTOTAL  --   --  49  --   --   TROPONINI 0.33* 0.28* 0.84* 0.65* 0.47*    BNP: Invalid input(s): POCBNP  CBG:  Recent Labs Lab 01/11/15 2151 01/12/15 0643 01/12/15 1728 01/12/15 2302 01/13/15 0640  GLUCAP 85 82 241* 153* 139*    Microbiology: Results for orders placed or performed during the hospital encounter of 01/09/15  Urine culture     Status: None   Collection Time: 01/09/15  1:31 AM  Result Value Ref Range Status   Specimen Description URINE, CLEAN CATCH  Final   Special Requests NONE  Final   Colony Count   Final    3,000 COLONIES/ML Performed at [facility]    Culture   Final    INSIGNIFICANT GROWTH Performed at [facility]    Report Status 01/10/2015 FINAL  Final  Stool culture     Status: None   Collection Time: 01/09/15  4:09 AM  Result Value Ref Range Status   Specimen Description VAGINAL/RECTAL  Final   Special Requests Normal  Final   Culture   Final    NO SALMONELLA, SHIGELLA, CAMPYLOBACTER, YERSINIA, OR E.COLI 0157:H7 ISOLATED Performed at [facility]    Report Status 01/12/2015 FINAL  Final  Clostridium Difficile by PCR     Status: None   Collection Time: 01/09/15  4:09 AM  Result Value Ref Range Status   C difficile by pcr NEGATIVE NEGATIVE Final  Culture, blood (routine x 2)     Status: None   Collection Time: 01/10/15  4:35 AM  Result Value Ref Range Status   Specimen Description BLOOD RIGHT HAND  Final   Special Requests BOTTLES DRAWN AEROBIC AND ANAEROBIC 10CC EA  Final   Culture   Final    STAPHYLOCOCCUS AUREUS Note: SUSCEPTIBILITIES PERFORMED ON PREVIOUS CULTURE WITHIN THE LAST 5 DAYS. Note: Culture results may be compromised due to an excessive volume of blood received in culture bottles. Gram Stain Report  Called to,Read Back By and Verified With:  MONICA YIM ON 01/10/2015 AT 11:36P BY WILEJ Performed at [facility]    Report Status 01/13/2015 FINAL  Final  Culture, blood (routine x 2)     Status: None   Collection Time: 01/10/15  4:46 AM  Result Value Ref Range Status   Specimen Description BLOOD LEFT ARM  Final   Special Requests BOTTLES DRAWN AEROBIC AND ANAEROBIC 10CC EA  Final   Culture   Final    STAPHYLOCOCCUS AUREUS Note: SUSCEPTIBILITIES PERFORMED ON PREVIOUS CULTURE WITHIN THE LAST 5 DAYS. Note: Culture results may be compromised due to an excessive volume of blood received in culture bottles. Gram Stain Report Called to,Read Back By and Verified With: MONICA YIM ON 01/10/2015 AT 11:36P BY WILEJ Performed at [facility]    Report Status 01/13/2015 FINAL  Final  Respiratory virus panel (routine influenza)     Status: None   Collection Time: 01/10/15 10:34 AM  Result Value Ref Range Status   Source - RVPAN NASAL SWAB  Corrected    Comment: CORRECTED ON 01/12 AT 2036: PREVIOUSLY REPORTED AS NASAL SWAB   Respiratory Syncytial Virus A NOT DETECTED  Final   Respiratory Syncytial Virus B NOT DETECTED  Final   Influenza A NOT DETECTED  Final   Influenza B NOT DETECTED  Final   Parainfluenza 1 NOT DETECTED  Final   Parainfluenza 2 NOT DETECTED  Final   Parainfluenza 3 NOT DETECTED  Final   Metapneumovirus NOT DETECTED  Final   Rhinovirus NOT DETECTED  Final   Adenovirus NOT DETECTED  Final   Influenza A H1 NOT DETECTED  Final   Influenza A H3 NOT DETECTED  Final    Comment: (NOTE)       Normal Reference Range for each Analyte: NOT DETECTED Testing performed using the Luminex xTAG Respiratory Viral Panel test kit. The analytical performance characteristics of this assay have been determined by [facility].  The modifications have not been cleared or approved by the FDA. This assay has been validated pursuant to the CLIA regulations and is used for  clinical purposes. Performed at [facility], blood (routine x 2)     Status: None   Collection Time: 01/10/15 11:19 AM  Result  Value Ref Range Status   Specimen Description BLOOD LEFT FOREARM  Final   Special Requests BOTTLES DRAWN AEROBIC ONLY 5CC  Final   Culture   Final    STAPHYLOCOCCUS AUREUS Note: RIFAMPIN AND GENTAMICIN SHOULD NOT BE USED AS SINGLE DRUGS FOR TREATMENT OF STAPH INFECTIONS. Note: Gram Stain Report Called to,Read Back By and Verified With: NURSE MONICA YIM 01/11/15 6:05AM THOMI Performed at [facility]    Report Status 01/13/2015 FINAL  Final   Organism ID, Bacteria STAPHYLOCOCCUS AUREUS  Final      Susceptibility   Staphylococcus aureus - MIC*    CLINDAMYCIN <=0.25 SENSITIVE Sensitive     ERYTHROMYCIN <=0.25 SENSITIVE Sensitive     GENTAMICIN <=0.5 SENSITIVE Sensitive     LEVOFLOXACIN 4 INTERMEDIATE Intermediate     OXACILLIN 0.5 SENSITIVE Sensitive     PENICILLIN 0.12 SENSITIVE Sensitive     RIFAMPIN 2 INTERMEDIATE Intermediate     TRIMETH/SULFA <=10 SENSITIVE Sensitive     VANCOMYCIN 1 SENSITIVE Sensitive     TETRACYCLINE <=1 SENSITIVE Sensitive     MOXIFLOXACIN 2 RESISTANT Resistant     * STAPHYLOCOCCUS AUREUS  Culture, blood (routine x 2)     Status: None   Collection Time: 01/10/15 11:27 AM  Result Value Ref Range Status   Specimen Description BLOOD LEFT ANTECUBITAL  Final   Special Requests BOTTLES DRAWN AEROBIC ONLY [place]  Final   Culture   Final    STAPHYLOCOCCUS AUREUS Note: SUSCEPTIBILITIES PERFORMED ON PREVIOUS CULTURE WITHIN THE LAST 5 DAYS. Note: Gram Stain Report Called to,Read Back By and Verified With: NURSE MONICA YIM 01/11/15 6:05AM THOMI Performed at [facility]    Report Status 01/13/2015 FINAL  Final    Coagulation Studies: No results for input(s): LABPROT, INR in the last 72 hours.  Urinalysis: No results for input(s): COLORURINE, LABSPEC, PHURINE, GLUCOSEU, HGBUR, BILIRUBINUR, KETONESUR,  PROTEINUR, UROBILINOGEN, NITRITE, LEUKOCYTESUR in the last 72 hours.  Invalid input(s): APPERANCEUR    Imaging: US Renal  01/12/2015   CLINICAL DATA:  Acute renal failure  EXAM: RENAL/URINARY TRACT ULTRASOUND COMPLETE  COMPARISON:  None.  FINDINGS: Right Kidney:  Length: 12.8 cm in length. Diffuse increased cortical echogenicity suspicious for medical renal disease. No hydronephrosis. No renal calculus.  Left Kidney:  Length: . 12.6 cm in length. Diffuse increased echogenicity suspicious for medical renal disease. No hydronephrosis. No diagnostic renal calculus.  Bladder:  Appears normal for degree of bladder distention.  IMPRESSION: Bilateral kidneys shows diffuse increased echogenicity. Medical renal disease cannot be excluded. No hydronephrosis or diagnostic renal calculus.   Electronically Signed   By: Lahoma Crocker M.D.   On: 01/12/2015 16:08   Mr Shoulder Right Wo Contrast  01/11/2015   CLINICAL DATA:  Right shoulder pain.  EXAM: MRI OF THE RIGHT SHOULDER WITHOUT CONTRAST  TECHNIQUE: Multiplanar, multisequence MR imaging of the shoulder was performed. No intravenous contrast was administered.  COMPARISON:  Radiographs dated 01/09/2015  FINDINGS: Rotator cuff:  Normal.  Muscles:  Normal.  Biceps long head: Properly located and intact. There is inflammation in the soft tissues around the bicipital tendon sheath with fluid in the bicipital tendon sheath.  Acromioclavicular Joint: Small degenerative cyst in the acromion. Otherwise normal AC joint. Type 1 acromion. No bursitis.  Glenohumeral Joint: There is a moderate glenohumeral joint effusion with extravasation of contrast from the subcoracoid recess of the joint into the adjacent soft tissues. Fluid distends the bicipital tendon sheath.  Labrum:  Intact.  Bones: No  significant abnormalities. Tiny degenerative cystic areas in the posterior aspect of the greater tuberosity of the proximal humerus.  IMPRESSION: 1. Moderate glenohumeral joint effusion with  some extravasation of joint. Fluid distends the bicipital tendon sheath with inflammation/extravasated joint fluid around the bicipital tendon sheath. The possibly of synovitis should be considered. 2. Normal rotator cuff.   Electronically Signed   By: Rozetta Nunnery M.D.   On: 01/11/2015 11:21   Mr Foot Right Wo Contrast  01/11/2015   CLINICAL DATA:  Diabetic foot ulcers.  EXAM: MRI OF THE RIGHT FOREFOOT WITHOUT CONTRAST  TECHNIQUE: Multiplanar, multisequence MR imaging was performed. No intravenous contrast was administered.  COMPARISON:  Radiographs dated 01/09/2011  FINDINGS: There are small erosions of the proximal phalanx at the IP joint of the great toe and there are small erosions at the first metatarsal phalangeal joint, both consistent with gout. There are no soft tissue abscesses. There is no osteomyelitis. There is nonspecific edema in the subcutaneous fat of the dorsum of the foot. Muscles and tendons and ligaments appear appear normal. No joint effusions.  IMPRESSION: Subcutaneous edema primarily on the dorsum of the foot. 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   Electronically Signed   By: Rozetta Nunnery M.D.   On: 01/11/2015 12:50   Mr Femur Left Wo Contrast  01/12/2015   CLINICAL DATA:  Diabetes. Peripheral neuropathy. Sepsis. Bilateral thigh pain. RIGHT thigh pain. Tenderness in the medial aspect of the thigh.  EXAM: MR OF THE LEFT FEMUR WITHOUT CONTRAST  TECHNIQUE: Multiplanar, multisequence MR imaging was performed. No intravenous contrast was administered.  COMPARISON:  None.  FINDINGS: Diffuse subcutaneous edema is present around the pelvis and in both thighs, most compatible with anasarca. This could also represent cellulitis in the appropriate clinical setting. Subcutaneous fluid is present in the lateral LEFT thigh, without a discrete abscess. There is myositis of the LEFT vastus lateralis with mild muscular edema av but no significant swelling. No fatty atrophy.  Bone marrow signal shows  heterogenous marrow. This is a nonspecific finding most commonly associated with obesity, anemia, cigarette smoking or chronic disease. Negative for hip fracture or AVN. There is no evidence of osteomyelitis. No deep soft tissue abscesses are present.  Bilaterally in the groin, there are prominent lymph nodes which are probably reactive to lower extremity process.  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  IMPRESSION: 1. Diffuse subcutaneous edema in the pelvis and thighs bilaterally, suggesting anasarca. 2. More prominent subcutaneous edema along the LEFT thigh with subcutaneous fluid superficial to the muscular fascia, likely representing cellulitis. No abscess. 3. Myositis of the proximal vastus lateralis, probably infectious. Reactive edema could produce this appearance as well. 4. Reactive inguinal adenopathy bilaterally. 5. Negative for osteomyelitis.   Electronically Signed   By: Dereck Ligas M.D.   On: 01/12/2015 14:33   Mr Foot Left Wo Contrast  01/11/2015   CLINICAL DATA:  Diabetic foot ulcers.  Foot pain.  Bony erosions.  EXAM: MRI OF THE LEFT FOREFOOT WITHOUT CONTRAST  TECHNIQUE: Multiplanar, multisequence MR imaging was performed. No intravenous contrast was administered.  COMPARISON:  Radiograph dated 01/09/2015  FINDINGS: There is slight edema throughout the medial cuneiform. There is artifactual increased signal from the metatarsal heads in from the proximal phalanx of the great toe. There are erosions at IP joint and first metatarsal phalangeal joint consistent with gout.  There are no soft tissue abscesses and there is no osteomyelitis.  There is prominent edema in the subcutaneous fat of the dorsum of the foot.  IMPRESSION: No evidence of osteomyelitis or abscess. The edema in the medial cuneiform is most likely a stress reaction. 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    Electronically Signed   By: Rozetta Nunnery M.D.   On: 01/11/2015 12:15     Medications:   . sodium chloride    . sodium chloride    . 0.9 % NaCl with KCl 20 mEq / L     .  ceFAZolin (ANCEF) IV  2 g Intravenous Q12H  . [START ON 01/14/2015] DAPTOmycin (CUBICIN)  IV  600 mg Intravenous Q48H  . feeding supplement (GLUCERNA SHAKE)  237 mL Oral BID BM  . feeding supplement (PRO-STAT SUGAR FREE 64)  30 mL Oral Q1500  . Influenza vac split quadrivalent PF  0.5 mL Intramuscular Tomorrow-1000  . insulin aspart  0-15 Units Subcutaneous TID WC  . insulin aspart  0-5 Units Subcutaneous QHS  . insulin aspart protamine- aspart  20 Units Subcutaneous BID WC  . living well with diabetes book   Does not apply Once  . metoprolol tartrate  25 mg Oral BID  . neomycin-bacitracin-polymyxin   Topical Daily  . nicotine  7 mg Transdermal Daily  . sodium chloride  500 mL Intravenous Once   acetaminophen **OR** acetaminophen, alum & mag hydroxide-simeth, hydrALAZINE, HYDROmorphone (DILAUDID) injection, ondansetron **OR** ondansetron (ZOFRAN) IV, oxyCODONE  Assessment/ Plan:  Dwayne [name] is a 35 y.o. male with a history of Uncontrolled HTN, DM2 who presents to the ED with complaints of diarrhea and fevers and chills x 24 hours. He reports having weakness and fatigue and He denies any nausea or vomiting. No one is sick at home, and he denies taking any recent antibiotic therapy. He reports that he has been out of his medications for months, and has not seen his PCP in months as well. 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 Was found to have staph bacteremia. Baseline creatinine was normal although creatinine was elevated on admission. No contrast administration and no NSAIDS. Received vancomycin. Outpatient medications included lisinopril and metformin   Acute renal failure or acute on chronic renal failure in setting of staph  aureus bacteremia. This may be ATN secondary to sepsis with underlying diabetic nephropathy and chronic renal disease normal U/S and creatinine improved  Anemia no ESA at present would not use Iron is setting in infection  Hypokalemia Replace with potassium  Hyponatremia secondary to ARF     LOS: 4 WEBB,MARTIN W _0 _1 :02 AM

## 2015-01-13 NOTE — Progress Notes (Signed)
ANTIBIOTIC CONSULT NOTE - follow up Pharmacy Consult for: cefazolin Indication: bacteremia  No Known Allergies  Patient Measurements:  Height: _0  (188 cm) Weight: 226 lb 6.4 oz (102.694 kg) IBW/kg (Calculated) : 82.2  Vital Signs: Temp: 98.6 F (37 C) (01/14 1357) Temp Source: Oral (01/14 0529) BP: 150/88 mmHg (01/14 1357) Pulse Rate: 92 (01/14 1357) Intake/Output from previous day: 01/13 0701 - 01/14 0700 In: 1302 [P.O.:1140; IV Piggyback:162] Out: 500 [Urine:500]  Labs:  Recent Labs  01/11/15 0235 01/12/15 0614 01/13/15 0503  WBC 15.3* 13.4* 13.4*  HGB 12.1* 10.3* 10.6*  PLT 153 169 243  CREATININE 2.52* 3.15* 3.05*   Estimated Creatinine Clearance: 43.6 mL/min (by C-G formula based on Cr of 3.05).   Microbiology: Recent Results (from the past 720 hour(s))  Urine culture     Status: None   Collection Time: 01/09/15  1:31 AM  Result Value Ref Range Status   Specimen Description URINE, CLEAN CATCH  Final   Special Requests NONE  Final   Colony Count   Final    3,000 COLONIES/ML Performed at [facility]    Culture   Final    INSIGNIFICANT GROWTH Performed at [facility]    Report Status 01/10/2015 FINAL  Final  Stool culture     Status: None   Collection Time: 01/09/15  4:09 AM  Result Value Ref Range Status   Specimen Description VAGINAL/RECTAL  Final   Special Requests Normal  Final   Culture   Final    NO SALMONELLA, SHIGELLA, CAMPYLOBACTER, YERSINIA, OR E.COLI 0157:H7 ISOLATED Performed at [facility]    Report Status 01/12/2015 FINAL  Final  Clostridium Difficile by PCR     Status: None   Collection Time: 01/09/15  4:09 AM  Result Value Ref Range Status   C difficile by pcr NEGATIVE NEGATIVE Final  Culture, blood (routine x 2)     Status: None   Collection Time: 01/10/15  4:35 AM  Result Value Ref Range Status   Specimen Description BLOOD RIGHT HAND  Final   Special Requests BOTTLES DRAWN AEROBIC AND ANAEROBIC  10CC EA  Final   Culture   Final    STAPHYLOCOCCUS AUREUS Note: SUSCEPTIBILITIES PERFORMED ON PREVIOUS CULTURE WITHIN THE LAST 5 DAYS. Note: Culture results may be compromised due to an excessive volume of blood received in culture bottles. Gram Stain Report Called to,Read Back By and Verified With:  MONICA YIM ON 01/10/2015 AT 11:36P BY WILEJ Performed at [facility]    Report Status 01/13/2015 FINAL  Final  Culture, blood (routine x 2)     Status: None   Collection Time: 01/10/15  4:46 AM  Result Value Ref Range Status   Specimen Description BLOOD LEFT ARM  Final   Special Requests BOTTLES DRAWN AEROBIC AND ANAEROBIC 10CC EA  Final   Culture   Final    STAPHYLOCOCCUS AUREUS Note: SUSCEPTIBILITIES PERFORMED ON PREVIOUS CULTURE WITHIN THE LAST 5 DAYS. Note: Culture results may be compromised due to an excessive volume of blood received in culture bottles. Gram Stain Report Called to,Read Back By and Verified With: MONICA YIM ON 01/10/2015 AT 11:36P BY Dennard Nip Performed at [facility]    Report Status 01/13/2015 FINAL  Final  Respiratory virus panel (routine influenza)     Status: None   Collection Time: 01/10/15 10:34 AM  Result Value Ref Range Status   Source - RVPAN NASAL SWAB  Corrected    Comment: CORRECTED ON  01/12 AT 2036: PREVIOUSLY REPORTED AS NASAL SWAB   Respiratory Syncytial Virus A NOT DETECTED  Final   Respiratory Syncytial Virus B NOT DETECTED  Final   Influenza A NOT DETECTED  Final   Influenza B NOT DETECTED  Final   Parainfluenza 1 NOT DETECTED  Final   Parainfluenza 2 NOT DETECTED  Final   Parainfluenza 3 NOT DETECTED  Final   Metapneumovirus NOT DETECTED  Final   Rhinovirus NOT DETECTED  Final   Adenovirus NOT DETECTED  Final   Influenza A H1 NOT DETECTED  Final   Influenza A H3 NOT DETECTED  Final    Comment: (NOTE)       Normal Reference Range for each Analyte: NOT DETECTED Testing performed using the Luminex xTAG Respiratory Viral Panel  test kit. The analytical performance characteristics of this assay have been determined by [facility].  The modifications have not been cleared or approved by the FDA. This assay has been validated pursuant to the CLIA regulations and is used for clinical purposes. Performed at [facility], blood (routine x 2)     Status: None   Collection Time: 01/10/15 11:19 AM  Result Value Ref Range Status   Specimen Description BLOOD LEFT FOREARM  Final   Special Requests BOTTLES DRAWN AEROBIC ONLY 5CC  Final   Culture   Final    STAPHYLOCOCCUS AUREUS Note: RIFAMPIN AND GENTAMICIN SHOULD NOT BE USED AS SINGLE DRUGS FOR TREATMENT OF STAPH INFECTIONS. Note: Gram Stain Report Called to,Read Back By and Verified With: NURSE MONICA YIM 01/11/15 6:05AM THOMI Performed at [facility]    Report Status 01/13/2015 FINAL  Final   Organism ID, Bacteria STAPHYLOCOCCUS AUREUS  Final      Susceptibility   Staphylococcus aureus - MIC*    CLINDAMYCIN <=0.25 SENSITIVE Sensitive     ERYTHROMYCIN <=0.25 SENSITIVE Sensitive     GENTAMICIN <=0.5 SENSITIVE Sensitive     LEVOFLOXACIN 4 INTERMEDIATE Intermediate     OXACILLIN 0.5 SENSITIVE Sensitive     PENICILLIN 0.12 SENSITIVE Sensitive     RIFAMPIN 2 INTERMEDIATE Intermediate     TRIMETH/SULFA <=10 SENSITIVE Sensitive     VANCOMYCIN 1 SENSITIVE Sensitive     TETRACYCLINE <=1 SENSITIVE Sensitive     MOXIFLOXACIN 2 RESISTANT Resistant     * STAPHYLOCOCCUS AUREUS  Culture, blood (routine x 2)     Status: None   Collection Time: 01/10/15 11:27 AM  Result Value Ref Range Status   Specimen Description BLOOD LEFT ANTECUBITAL  Final   Special Requests BOTTLES DRAWN AEROBIC ONLY [place]  Final   Culture   Final    STAPHYLOCOCCUS AUREUS Note: SUSCEPTIBILITIES PERFORMED ON PREVIOUS CULTURE WITHIN THE LAST 5 DAYS. Note: Gram Stain Report Called to,Read Back By and Verified With: NURSE MONICA YIM 01/11/15 6:05AM THOMI Performed at  [facility]    Report Status 01/13/2015 FINAL  Final    Medical History: Past Medical History  Diagnosis Date  . Diabetes mellitus   . Hypertension    Assessment:  35 yr old male admitted on 01/09/14 with diarrhea, fever and chills x 24 hrs.  Blood cultures positive for MSSA.  TEE shows mitral valve vegetation and may need valve surgery.  Daptomycin stopped given susceptibilities.  Creatinine down slightly overnight.  Creatinine clearance is ~71mL/min per my calculation  Vanc 1/11>>1/12 Zosyn 1/11>>1/12 Flagyl 1/10>>1/11 daptomycin 1/12>>1/14 Cefazolin 1/13>>  1/10 urine -3K insig growth 1/10 C diff neg 1/11 blood  x 4 -MSSA  1/11 RSV -neg  Goal of Therapy:  Eradicate bacteremia   Plan:   Cefazolin 2g IV q8h  Heide Guile, PharmD, BCPS Clinical Pharmacist Pager [phone]  01/13/2015 2:21 PM

## 2015-01-13 NOTE — Progress Notes (Signed)
[facility]    Subjective: Patient still quite sleepy from sedation from TEE   Antibiotics:  Anti-infectives    Start     Dose/Rate Route Frequency Ordered Stop   01/14/15 1500  DAPTOmycin (CUBICIN) 600 mg in sodium chloride 0.9 % IVPB  Status:  Discontinued     600 mg224 mL/hr over 30 Minutes Intravenous Every 48 hours 01/12/15 1421 01/13/15 1239   01/13/15 1500  ceFAZolin (ANCEF) IVPB 2 g/50 mL premix     2 g100 mL/hr over 30 Minutes Intravenous 3 times per day 01/13/15 1407     01/12/15 1600  ceFAZolin (ANCEF) IVPB 2 g/50 mL premix  Status:  Discontinued     2 g100 mL/hr over 30 Minutes Intravenous Every 12 hours 01/12/15 1419 01/13/15 1407   01/11/15 1200  DAPTOmycin (CUBICIN) 600 mg in sodium chloride 0.9 % IVPB  Status:  Discontinued     600 mg224 mL/hr over 30 Minutes Intravenous Every 24 hours 01/11/15 1003 01/12/15 1421   01/10/15 1200  vancomycin (VANCOCIN) IVPB 1000 mg/200 mL premix  Status:  Discontinued     1,000 mg200 mL/hr over 60 Minutes Intravenous Every 8 hours 01/10/15 1044 01/11/15 0947   01/10/15 1100  piperacillin-tazobactam (ZOSYN) IVPB 3.375 g  Status:  Discontinued     3.375 g12.5 mL/hr over 240 Minutes Intravenous Every 8 hours 01/10/15 1043 01/11/15 0902   01/09/15 0700  metroNIDAZOLE (FLAGYL) IVPB 500 mg  Status:  Discontinued     500 mg100 mL/hr over 60 Minutes Intravenous Every 8 hours 01/09/15 0651 01/10/15 1018      Medications: Scheduled Meds: .  ceFAZolin (ANCEF) IV  2 g Intravenous 3 times per day  . feeding supplement (GLUCERNA SHAKE)  237 mL Oral BID BM  . feeding supplement (PRO-STAT SUGAR FREE 64)  30 mL Oral Q1500  . Influenza vac split quadrivalent PF  0.5 mL Intramuscular Tomorrow-1000  . insulin aspart  0-15 Units Subcutaneous TID WC  . insulin aspart  0-5 Units Subcutaneous QHS  . insulin aspart protamine- aspart  20 Units Subcutaneous BID WC  . living well with diabetes book   Does not apply Once  .  metoprolol tartrate  25 mg Oral BID  . neomycin-bacitracin-polymyxin   Topical Daily  . nicotine  7 mg Transdermal Daily  . sodium chloride  500 mL Intravenous Once   Continuous Infusions: . sodium chloride 500 mL (01/13/15 1327)  . sodium chloride    . 0.9 % NaCl with KCl 40 mEq / L     PRN Meds:.acetaminophen **OR** acetaminophen, alum & mag hydroxide-simeth, hydrALAZINE, HYDROmorphone (DILAUDID) injection, ondansetron **OR** ondansetron (ZOFRAN) IV, oxyCODONE    Objective: Weight change:   Intake/Output Summary (Last 24 hours) at 01/13/15 1649 Last data filed at 01/13/15 1327  Gross per 24 hour  Intake    520 ml  Output    300 ml  Net    220 ml   Blood pressure 150/88, pulse 92, temperature 98.6 F (37 C), temperature source Oral, resp. rate 20, height 6' 2" (1.88 m), weight 226 lb 6.4 oz (102.694 kg), SpO2 100 %. Temp:  [98.6 F (37 C)-99.9 F (37.7 C)] 98.6 F (37 C) (01/14 1357) Pulse Rate:  [88-108] 92 (01/14 1357) Resp:  [8-25] 20 (01/14 1357) BP: (118-194)/(76-111) 150/88 mmHg (01/14 1357) SpO2:  [95 %-100 %] 100 % (01/14 1357)  Physical Exam: General:sleepy but arousable briefly  Neuro; non focal CBC:   CBC Latest  Ref Rng 01/13/2015 01/12/2015 01/11/2015  WBC 4.0 - 10.5 K/uL 13.4(H) 13.4(H) 15.3(H)  Hemoglobin 13.0 - 17.0 g/dL 10.6(L) 10.3(L) 12.1(L)  Hematocrit 39.0 - 52.0 % 31.4(L) 30.8(L) 34.8(L)  Platelets 150 - 400 K/uL 243 169 153     BMET  Recent Labs  01/12/15 0614 01/13/15 0503  NA 129* 134*  K 3.0* 3.1*  CL 98 101  CO2 21 22  GLUCOSE 97 123*  BUN 34* 37*  CREATININE 3.15* 3.05*  CALCIUM 7.5* 8.0*     Liver Panel   Recent Labs  01/12/15 0614 01/13/15 0503  PROT 5.0* 5.7*  ALBUMIN 1.4* 1.4*  AST 25 23  ALT 23 21  ALKPHOS 81 151*  BILITOT 0.6 0.2*       Sedimentation Rate  Recent Labs  01/12/15 0614  ESRSEDRATE 111*   C-Reactive Protein No results for input(s): CRP in the last 72 hours.  Micro  Results: Recent Results (from the past 720 hour(s))  Urine culture     Status: None   Collection Time: 01/09/15  1:31 AM  Result Value Ref Range Status   Specimen Description URINE, CLEAN CATCH  Final   Special Requests NONE  Final   Colony Count   Final    3,000 COLONIES/ML Performed at [facility]    Culture   Final    INSIGNIFICANT GROWTH Performed at [facility]    Report Status 01/10/2015 FINAL  Final  Stool culture     Status: None   Collection Time: 01/09/15  4:09 AM  Result Value Ref Range Status   Specimen Description VAGINAL/RECTAL  Final   Special Requests Normal  Final   Culture   Final    NO SALMONELLA, SHIGELLA, CAMPYLOBACTER, YERSINIA, OR E.COLI 0157:H7 ISOLATED Performed at [facility]    Report Status 01/12/2015 FINAL  Final  Clostridium Difficile by PCR     Status: None   Collection Time: 01/09/15  4:09 AM  Result Value Ref Range Status   C difficile by pcr NEGATIVE NEGATIVE Final  Culture, blood (routine x 2)     Status: None   Collection Time: 01/10/15  4:35 AM  Result Value Ref Range Status   Specimen Description BLOOD RIGHT HAND  Final   Special Requests BOTTLES DRAWN AEROBIC AND ANAEROBIC 10CC EA  Final   Culture   Final    STAPHYLOCOCCUS AUREUS Note: SUSCEPTIBILITIES PERFORMED ON PREVIOUS CULTURE WITHIN THE LAST 5 DAYS. Note: Culture results may be compromised due to an excessive volume of blood received in culture bottles. Gram Stain Report Called to,Read Back By and Verified With:  MONICA YIM ON 01/10/2015 AT 11:36P BY WILEJ Performed at [facility]    Report Status 01/13/2015 FINAL  Final  Culture, blood (routine x 2)     Status: None   Collection Time: 01/10/15  4:46 AM  Result Value Ref Range Status   Specimen Description BLOOD LEFT ARM  Final   Special Requests BOTTLES DRAWN AEROBIC AND ANAEROBIC 10CC EA  Final   Culture   Final    STAPHYLOCOCCUS AUREUS Note: SUSCEPTIBILITIES PERFORMED ON PREVIOUS  CULTURE WITHIN THE LAST 5 DAYS. Note: Culture results may be compromised due to an excessive volume of blood received in culture bottles. Gram Stain Report Called to,Read Back By and Verified With: MONICA YIM ON 01/10/2015 AT 11:36P BY Dennard Nip Performed at [facility]    Report Status 01/13/2015 FINAL  Final  Respiratory virus panel (routine influenza)  Status: None   Collection Time: 01/10/15 10:34 AM  Result Value Ref Range Status   Source - RVPAN NASAL SWAB  Corrected    Comment: CORRECTED ON 01/12 AT 2036: PREVIOUSLY REPORTED AS NASAL SWAB   Respiratory Syncytial Virus A NOT DETECTED  Final   Respiratory Syncytial Virus B NOT DETECTED  Final   Influenza A NOT DETECTED  Final   Influenza B NOT DETECTED  Final   Parainfluenza 1 NOT DETECTED  Final   Parainfluenza 2 NOT DETECTED  Final   Parainfluenza 3 NOT DETECTED  Final   Metapneumovirus NOT DETECTED  Final   Rhinovirus NOT DETECTED  Final   Adenovirus NOT DETECTED  Final   Influenza A H1 NOT DETECTED  Final   Influenza A H3 NOT DETECTED  Final    Comment: (NOTE)       Normal Reference Range for each Analyte: NOT DETECTED Testing performed using the Luminex xTAG Respiratory Viral Panel test kit. The analytical performance characteristics of this assay have been determined by [facility].  The modifications have not been cleared or approved by the FDA. This assay has been validated pursuant to the CLIA regulations and is used for clinical purposes. Performed at [facility], blood (routine x 2)     Status: None   Collection Time: 01/10/15 11:19 AM  Result Value Ref Range Status   Specimen Description BLOOD LEFT FOREARM  Final   Special Requests BOTTLES DRAWN AEROBIC ONLY 5CC  Final   Culture   Final    STAPHYLOCOCCUS AUREUS Note: RIFAMPIN AND GENTAMICIN SHOULD NOT BE USED AS SINGLE DRUGS FOR TREATMENT OF STAPH INFECTIONS. Note: Gram Stain Report Called to,Read Back By and Verified With:  NURSE MONICA YIM 01/11/15 6:05AM THOMI Performed at [facility]    Report Status 01/13/2015 FINAL  Final   Organism ID, Bacteria STAPHYLOCOCCUS AUREUS  Final      Susceptibility   Staphylococcus aureus - MIC*    CLINDAMYCIN <=0.25 SENSITIVE Sensitive     ERYTHROMYCIN <=0.25 SENSITIVE Sensitive     GENTAMICIN <=0.5 SENSITIVE Sensitive     LEVOFLOXACIN 4 INTERMEDIATE Intermediate     OXACILLIN 0.5 SENSITIVE Sensitive     PENICILLIN 0.12 SENSITIVE Sensitive     RIFAMPIN 2 INTERMEDIATE Intermediate     TRIMETH/SULFA <=10 SENSITIVE Sensitive     VANCOMYCIN 1 SENSITIVE Sensitive     TETRACYCLINE <=1 SENSITIVE Sensitive     MOXIFLOXACIN 2 RESISTANT Resistant     * STAPHYLOCOCCUS AUREUS  Culture, blood (routine x 2)     Status: None   Collection Time: 01/10/15 11:27 AM  Result Value Ref Range Status   Specimen Description BLOOD LEFT ANTECUBITAL  Final   Special Requests BOTTLES DRAWN AEROBIC ONLY [place]  Final   Culture   Final    STAPHYLOCOCCUS AUREUS Note: SUSCEPTIBILITIES PERFORMED ON PREVIOUS CULTURE WITHIN THE LAST 5 DAYS. Note: Gram Stain Report Called to,Read Back By and Verified With: NURSE MONICA YIM 01/11/15 6:05AM THOMI Performed at [facility]    Report Status 01/13/2015 FINAL  Final    Studies/Results: US Renal  01/12/2015   CLINICAL DATA:  Acute renal failure  EXAM: RENAL/URINARY TRACT ULTRASOUND COMPLETE  COMPARISON:  None.  FINDINGS: Right Kidney:  Length: 12.8 cm in length. Diffuse increased cortical echogenicity suspicious for medical renal disease. No hydronephrosis. No renal calculus.  Left Kidney:  Length: . 12.6 cm in length. Diffuse increased echogenicity suspicious for medical renal disease.  No hydronephrosis. No diagnostic renal calculus.  Bladder:  Appears normal for degree of bladder distention.  IMPRESSION: Bilateral kidneys shows diffuse increased echogenicity. Medical renal disease cannot be excluded. No hydronephrosis or diagnostic renal  calculus.   Electronically Signed   By: Lahoma Crocker M.D.   On: 01/12/2015 16:08   Mr Femur Left Wo Contrast  01/12/2015   CLINICAL DATA:  Diabetes. Peripheral neuropathy. Sepsis. Bilateral thigh pain. RIGHT thigh pain. Tenderness in the medial aspect of the thigh.  EXAM: MR OF THE LEFT FEMUR WITHOUT CONTRAST  TECHNIQUE: Multiplanar, multisequence MR imaging was performed. No intravenous contrast was administered.  COMPARISON:  None.  FINDINGS: Diffuse subcutaneous edema is present around the pelvis and in both thighs, most compatible with anasarca. This could also represent cellulitis in the appropriate clinical setting. Subcutaneous fluid is present in the lateral LEFT thigh, without a discrete abscess. There is myositis of the LEFT vastus lateralis with mild muscular edema av but no significant swelling. No fatty atrophy.  Bone marrow signal shows heterogenous marrow. This is a nonspecific finding most commonly associated with obesity, anemia, cigarette smoking or chronic disease. Negative for hip fracture or AVN. There is no evidence of osteomyelitis. No deep soft tissue abscesses are present.  Bilaterally in the groin, there are prominent lymph nodes which are probably reactive to lower extremity process.  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  IMPRESSION: 1. Diffuse subcutaneous edema in the pelvis and thighs bilaterally, suggesting anasarca. 2. More prominent subcutaneous edema along the LEFT thigh with subcutaneous fluid superficial to the muscular fascia, likely representing cellulitis. No abscess. 3. Myositis of the proximal vastus lateralis, probably infectious. Reactive edema could produce this appearance as well. 4. Reactive inguinal adenopathy bilaterally. 5. Negative for osteomyelitis.   Electronically Signed   By: Dereck Ligas M.D.   On: 01/12/2015 14:33      Assessment/Plan:  Active Problems:   Tobacco abuse    Noncompliance   Dehydration   Diarrhea   Diabetes type 2, uncontrolled   Hyperglycemia   Uncontrolled hypertension   Diabetic foot ulcers   Pain   Diabetic ulcer of left foot associated with type 2 diabetes mellitus   Diabetic ulcer of right foot associated with type 2 diabetes mellitus   Osteomyelitis   Shoulder pain   Thigh pain, musculoskeletal   Septic shock   Staphylococcus aureus bacteremia with sepsis   Smoker   Elevated troponin   Acute renal failure   Bacteremia   Septic joint of right shoulder region   Infective myositis of left thigh   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  #1       Antimicrobial Management Team Staphylococcus aureus bacteremia   Staphylococcus aureus bacteremia (SAB) is associated with a high rate of complications and mortality.  Specific aspects of clinical management are critical to optimizing the outcome of patients with SAB.  Therefore, the [facility] Antimicrobial Management Team [facility]) has initiated an intervention aimed at improving the management of SAB at [facility].  To do so, Infectious Diseases physicians are providing an evidence-based consult for the management  of all patients with SAB.     Yes No Comments  Perform follow-up blood cultures (even if the patient is afebrile) to ensure clearance of bacteremia [] []  We NEED REPEAT BLOOD CULTURES TO PROVE CLEARANCE , I SEE NONE DONE AFTER THE 11TH,   Remove vascular catheter and obtain follow-up blood cultures after the removal of the catheter [] []  do not place central access until we  have cleared his bacteremia   Perform echocardiography to evaluate for endocarditis (transthoracic ECHO is 40-50% sensitive, TEE is > 90% sensitive) [x] [] TEE SHOWS infectious aneurysm and perforation of the base of the medial scallop of the posterior mitral leaflet. There is an associated 4x6 mm vegetation. There is severe mitral insufficiency through the perforation in the posterior leaflet with reversal of flow in both the left and right pulmonary veins         Ensure source control [] [] i AM FAIRLY CONFIDENT HIS SHOULDER IS INFECTED  WITH STAPHYLOCOCCUS AUREUS, AND  CONCERNED FOR MYOSITIS OF LEFT THIGH   GREATLY APPRECIATE DR. BLACKMAN SEEING PT  WILL SEE WHAT ASPIRATE FROM SHOULDER SHOWS IN PARTICULAR THE CELL COUNT AND DIFFERENTIAL, THE GRAM STAIN AND CULTURE MAY BE WORTHLESS IF NEGATIVE SINCE ON ABX ALREADY  I WOULD ALSO RECOMMEND REPEATING MRI OF BOTH THIGHS IN ONE WEEK  HE ALSO NEEDS ABOVE ALL CT SURGERY FORMAL CONSULT GIVEN HIS PERFORATED VALVE AND CONCERN FOR PURULENT [place]   Investigate for "metastatic" sites of infection [] [] .SEE ABOVE DISCUSSION  Change antibiotic therapy to  cefazolin   [] [] Beta-lactam antibiotics are preferred for MSSA due to higher cure rates.   If on Vancomycin, goal trough should be 15 - 20 mcg/mL  Estimated duration of IV antibiotic therapy:     8 WEEKS POST HEART SURGERY ALL OTHER POTENTIAL SURGERIES  [] [] Consult case management for probably prolonged outpatient IV antibiotic therapy       LOS: 4 days   Alcide Evener 01/13/2015, 4:49 PM

## 2015-01-13 NOTE — Progress Notes (Signed)
Lab called this nurse stating that patient was being very non-compliant and would not allow them to draw labs. They will return later and try again. At the same time patient refused his insulin.

## 2015-01-13 NOTE — Progress Notes (Signed)
    TEE reviewed personally. MV veg, with severe MR. Pericardial effusion with fibrinous strands  Soft to no murmur heard on exam.   ECHO (TTE) from 2 days ago no MR. Likely progression of valvular dz.   Spoke with him about findings.   Will consult TCTS to review.  ?Timing of MV replacement (likely after sterilization since no acute heart failure) ? Pericardial stripping for potential staph pericarditis. May be challenging to treat with abx alone. Small effusion. No tamponade.    Donato SchultzSKAINS, Delailah Spieth, MD

## 2015-01-13 NOTE — Op Note (Signed)
INDICATIONS: infective endocarditis  PROCEDURE:   Informed consent was obtained prior to the procedure. The risks, benefits and alternatives for the procedure were discussed and the patient comprehended these risks.  Risks include, but are not limited to, cough, sore throat, vomiting, nausea, somnolence, esophageal and stomach trauma or perforation, bleeding, low blood pressure, aspiration, pneumonia, infection, trauma to the teeth and death.    After a procedural time-out, the oropharynx was anesthetized with 20% benzocaine spray. The patient was given 6 mg versed and 50 mcg fentanyl for moderate sedation.   The transesophageal probe was inserted in the esophagus and stomach without difficulty and multiple views were obtained.  The patient was kept under observation until the patient left the procedure room.  The patient left the procedure room in stable condition.   Agitated microbubble saline contrast was not administered.  COMPLICATIONS:   Patient was poorly cooperative and extubated twice before the procedure was finally completed. There were no immediate complications.  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 Other valves appear normal. Normal LV function. Moderate to severe LVH.  RECOMMENDATIONS:   Antibiotic treatment for infective endocarditis and CV surgery consultation for MV surgery after sterilization.  Time Spent Directly with the Patient:  60 minutes   Bryanda Mikel 01/13/2015, 12:56 PM

## 2015-01-13 NOTE — Progress Notes (Signed)
[facility] PROGRESS NOTE  Dwayne [name] ERD:[id number] DOB: Jul 26, 1980 DOA: 01/09/2015 PCP: No PCP Per Patient  Assessment/Plan: Active Problems:   Tobacco abuse   Noncompliance   Dehydration   Diarrhea   Diabetes type 2, uncontrolled   Hyperglycemia   Uncontrolled hypertension   Diabetic foot ulcers   Pain   Diabetic ulcer of left foot associated with type 2 diabetes mellitus   Diabetic ulcer of right foot associated with type 2 diabetes mellitus   Osteomyelitis   Shoulder pain   Thigh pain, musculoskeletal   Septic shock   Staphylococcus aureus bacteremia with sepsis   Smoker   Elevated troponin   Acute renal failure   Bacteremia   Septic joint of right shoulder region   Infective myositis of left thigh   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 by infectious disease Dr. Drucilla Schmidt  influenza PCR negative  respiratory panel negative UA negative MRI both feet as per infectious disease recommendations No evidence of osteomyelitis on bilateral MRI of the feet MRI of the right shoulder, shows effusion with possible synovitis Appreciate orthopedic consultation, consult IR to aspirate the right shoulder MRI left thigh -Myositis of the proximal vastus lateralis, probably infectious. Awaiting aspiration of the right shoulder Infectious disease also recommends MRI of the right thigh     Bacteremia with possible endocarditis 2-D echo recommends A cardiac MRI should be considered for the evaluation of infiltrative cardiomyopathy. Infectious disease also concerned about staph aureus purulent pericarditis. 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 CV surgery consultation for MV surgery after sterilization   Diarrhea C. difficile negative   Poorly controlled diabetes Hemoglobin A1c 8.1 Patient admits to being noncompliant with medications Continue current insulin regimen Diabetes coordinator consult   Hypertension Continue Lopressor BID, hydralazine as needed  Acute renal failure-prerenal vs vancomycin toxicity Likely prerenal with a baseline creatinine of 1.24, slight improvement today Appreciate nephrology input, nephrology consulted-Dr.webb Renal ultrasound does not show any hydronephrosis Continue gentle IV hydration    Hypokalemia replete Check magnesium level   Code Status: full Family Communication: family updated about patient's clinical progress Disposition Plan:  As above    Brief narrative: Dwayne [name] is a 35 y.o. male with a history of Uncontrolled HTN, DM2 who presents to the ED with complaints of diarrhea and fevers and chills x 24 hours. He reports having weakness and fatigue and He denies any nausea or vomiting. No one is sick at home, and he denies taking any recent antibiotic therapy. He reports that he has been out of his medications for months, and has not seen his PCP in months as well. 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   Consultants:  None  Procedures:  None  Antibiotics: Flagyl discontinued < 1/11 Vancomycin and Zosyn > 1/11  HPI/Subjective: Patient tachycardic, hypertensive, febrile Diarrhea has improved, still complaining of nausea and vomiting Nausea improved , Denies significant right shoulder pain   Objective: Filed Vitals:   01/13/15 1310 01/13/15 1315 01/13/15 1320 01/13/15  1325  BP: 176/111 194/105 168/108  159/95  Pulse: 92 95 89 95  Temp:      TempSrc:      Resp: 16 21 13 24   Height:      Weight:      SpO2: 100% 100% 98% 99%    Intake/Output Summary (Last 24 hours) at 01/13/15 1331 Last data filed at 01/13/15 1327  Gross per 24 hour  Intake    682 ml  Output    500 ml  Net    182 ml    Exam:  General: alert & oriented x 3 In NAD  Cardiovascular: RRR, nl S1 s2  Respiratory: Decreased breath sounds at the bases, scattered rhonchi, no crackles  Abdomen: soft +BS NT/ND, no masses palpable  Extremities: No cyanosis and no edema      Data Reviewed: Basic Metabolic Panel:  Recent Labs Lab 01/09/15 0500 01/10/15 0508 01/10/15 1121 01/11/15 0235 01/11/15 0953 01/12/15 0614 01/13/15 0503  NA 136 132*  --  131*  --  129* 134*  K 3.0* 3.0*  --  3.6  --  3.0* 3.1*  CL 105 103  --  100  --  98 101  CO2 23 23  --  21  --  21 22  GLUCOSE 253* 103*  --  172*  --  97 123*  BUN 17 21  --  29*  --  34* 37*  CREATININE 1.86* 2.15*  --  2.52*  --  3.15* 3.05*  CALCIUM 7.7* 8.0*  --  8.0*  --  7.5* 8.0*  MG 1.5  --  1.6  --  1.9  --   --     Liver Function Tests:  Recent Labs Lab 01/09/15 0021 01/11/15 0235 01/12/15 0614 01/13/15 0503  AST 25 20 25 23   ALT 19 23 23 21   ALKPHOS 86 80 81 151*  BILITOT 0.6 0.6 0.6 0.2*  PROT 6.0 6.3 5.0* 5.7*  ALBUMIN 2.1* 1.7* 1.4* 1.4*   No results for input(s): LIPASE, AMYLASE in the last 168 hours. No results for input(s): AMMONIA in the last 168 hours.  CBC:  Recent Labs Lab 01/09/15 0021 01/09/15 0500 01/10/15 0508 01/11/15 0235 01/12/15 0614 01/13/15 0503  WBC 11.3* 10.5 13.8* 15.3* 13.4* 13.4*  NEUTROABS 9.3*  --   --   --   --   --   HGB 11.7* 10.6* 12.2* 12.1* 10.3* 10.6*  HCT 34.7* 31.8* 35.1* 34.8* 30.8* 31.4*  MCV 82.6 82.4 81.6 81.3 82.1 82.4  PLT 179 141* 126* 153 169 243    Cardiac Enzymes:  Recent Labs Lab 01/10/15 2036 01/11/15 0235 01/12/15 0614 01/12/15 1130 01/12/15 1849  CKTOTAL  --   --  49   --   --   TROPONINI 0.33* 0.28* 0.84* 0.65* 0.47*   BNP (last 3 results) No results for input(s): PROBNP in the last 8760 hours.   CBG:  Recent Labs Lab 01/12/15 0643 01/12/15 1728 01/12/15 2302 01/13/15 0640 01/13/15 1114  GLUCAP 82 241* 153* 139* 85    Recent Results (from the past 240 hour(s))  Urine culture     Status: None   Collection Time: 01/09/15  1:31 AM  Result Value Ref Range Status   Specimen Description URINE, CLEAN CATCH  Final   Special Requests NONE  Final   Colony Count   Final    3,000 COLONIES/ML Performed at [facility]   Final    INSIGNIFICANT  GROWTH Performed at [facility]    Report Status 01/10/2015 FINAL  Final  Stool culture     Status: None   Collection Time: 01/09/15  4:09 AM  Result Value Ref Range Status   Specimen Description VAGINAL/RECTAL  Final   Special Requests Normal  Final   Culture   Final    NO SALMONELLA, SHIGELLA, CAMPYLOBACTER, YERSINIA, OR E.COLI 0157:H7 ISOLATED Performed at [facility]    Report Status 01/12/2015 FINAL  Final  Clostridium Difficile by PCR     Status: None   Collection Time: 01/09/15  4:09 AM  Result Value Ref Range Status   C difficile by pcr NEGATIVE NEGATIVE Final  Culture, blood (routine x 2)     Status: None   Collection Time: 01/10/15  4:35 AM  Result Value Ref Range Status   Specimen Description BLOOD RIGHT HAND  Final   Special Requests BOTTLES DRAWN AEROBIC AND ANAEROBIC 10CC EA  Final   Culture   Final    STAPHYLOCOCCUS AUREUS Note: SUSCEPTIBILITIES PERFORMED ON PREVIOUS CULTURE WITHIN THE LAST 5 DAYS. Note: Culture results may be compromised due to an excessive volume of blood received in culture bottles. Gram Stain Report Called to,Read Back By and Verified With:  MONICA YIM ON 01/10/2015 AT 11:36P BY WILEJ Performed at [facility]    Report Status 01/13/2015 FINAL  Final  Culture, blood (routine x 2)     Status: None   Collection Time:  01/10/15  4:46 AM  Result Value Ref Range Status   Specimen Description BLOOD LEFT ARM  Final   Special Requests BOTTLES DRAWN AEROBIC AND ANAEROBIC 10CC EA  Final   Culture   Final    STAPHYLOCOCCUS AUREUS Note: SUSCEPTIBILITIES PERFORMED ON PREVIOUS CULTURE WITHIN THE LAST 5 DAYS. Note: Culture results may be compromised due to an excessive volume of blood received in culture bottles. Gram Stain Report Called to,Read Back By and Verified With: MONICA YIM ON 01/10/2015 AT 11:36P BY WILEJ Performed at [facility]    Report Status 01/13/2015 FINAL  Final  Respiratory virus panel (routine influenza)     Status: None   Collection Time: 01/10/15 10:34 AM  Result Value Ref Range Status   Source - RVPAN NASAL SWAB  Corrected    Comment: CORRECTED ON 01/12 AT 2036: PREVIOUSLY REPORTED AS NASAL SWAB   Respiratory Syncytial Virus A NOT DETECTED  Final   Respiratory Syncytial Virus B NOT DETECTED  Final   Influenza A NOT DETECTED  Final   Influenza B NOT DETECTED  Final   Parainfluenza 1 NOT DETECTED  Final   Parainfluenza 2 NOT DETECTED  Final   Parainfluenza 3 NOT DETECTED  Final   Metapneumovirus NOT DETECTED  Final   Rhinovirus NOT DETECTED  Final   Adenovirus NOT DETECTED  Final   Influenza A H1 NOT DETECTED  Final   Influenza A H3 NOT DETECTED  Final    Comment: (NOTE)       Normal Reference Range for each Analyte: NOT DETECTED Testing performed using the Luminex xTAG Respiratory Viral Panel test kit. The analytical performance characteristics of this assay have been determined by [facility].  The modifications have not been cleared or approved by the FDA. This assay has been validated pursuant to the CLIA regulations and is used for clinical purposes. Performed at [facility], blood (routine x 2)     Status: None   Collection Time: 01/10/15  11:19 AM  Result Value Ref Range Status   Specimen Description BLOOD LEFT FOREARM  Final   Special  Requests BOTTLES DRAWN AEROBIC ONLY 5CC  Final   Culture   Final    STAPHYLOCOCCUS AUREUS Note: RIFAMPIN AND GENTAMICIN SHOULD NOT BE USED AS SINGLE DRUGS FOR TREATMENT OF STAPH INFECTIONS. Note: Gram Stain Report Called to,Read Back By and Verified With: NURSE MONICA YIM 01/11/15 6:05AM THOMI Performed at [facility]    Report Status 01/13/2015 FINAL  Final   Organism ID, Bacteria STAPHYLOCOCCUS AUREUS  Final      Susceptibility   Staphylococcus aureus - MIC*    CLINDAMYCIN <=0.25 SENSITIVE Sensitive     ERYTHROMYCIN <=0.25 SENSITIVE Sensitive     GENTAMICIN <=0.5 SENSITIVE Sensitive     LEVOFLOXACIN 4 INTERMEDIATE Intermediate     OXACILLIN 0.5 SENSITIVE Sensitive     PENICILLIN 0.12 SENSITIVE Sensitive     RIFAMPIN 2 INTERMEDIATE Intermediate     TRIMETH/SULFA <=10 SENSITIVE Sensitive     VANCOMYCIN 1 SENSITIVE Sensitive     TETRACYCLINE <=1 SENSITIVE Sensitive     MOXIFLOXACIN 2 RESISTANT Resistant     * STAPHYLOCOCCUS AUREUS  Culture, blood (routine x 2)     Status: None   Collection Time: 01/10/15 11:27 AM  Result Value Ref Range Status   Specimen Description BLOOD LEFT ANTECUBITAL  Final   Special Requests BOTTLES DRAWN AEROBIC ONLY [place]  Final   Culture   Final    STAPHYLOCOCCUS AUREUS Note: SUSCEPTIBILITIES PERFORMED ON PREVIOUS CULTURE WITHIN THE LAST 5 DAYS. Note: Gram Stain Report Called to,Read Back By and Verified With: NURSE MONICA YIM 01/11/15 6:05AM THOMI Performed at [facility]    Report Status 01/13/2015 FINAL  Final     Studies: Dg Shoulder Right  01/09/2015   CLINICAL DATA:  Anterior right shoulder pain  EXAM: RIGHT SHOULDER - 2+ VIEW  COMPARISON:  None.  FINDINGS: No fracture or dislocation is seen.  The joint spaces are preserved.  The visualized soft tissues are unremarkable.  Visualized right lung is clear.  IMPRESSION: No fracture or dislocation is seen.   Electronically Signed   By: Julian Hy M.D.   On: 01/09/2015 09:44   US  Renal  01/12/2015   CLINICAL DATA:  Acute renal failure  EXAM: RENAL/URINARY TRACT ULTRASOUND COMPLETE  COMPARISON:  None.  FINDINGS: Right Kidney:  Length: 12.8 cm in length. Diffuse increased cortical echogenicity suspicious for medical renal disease. No hydronephrosis. No renal calculus.  Left Kidney:  Length: . 12.6 cm in length. Diffuse increased echogenicity suspicious for medical renal disease. No hydronephrosis. No diagnostic renal calculus.  Bladder:  Appears normal for degree of bladder distention.  IMPRESSION: Bilateral kidneys shows diffuse increased echogenicity. Medical renal disease cannot be excluded. No hydronephrosis or diagnostic renal calculus.   Electronically Signed   By: Lahoma Crocker M.D.   On: 01/12/2015 16:08   Mr Shoulder Right Wo Contrast  01/11/2015   CLINICAL DATA:  Right shoulder pain.  EXAM: MRI OF THE RIGHT SHOULDER WITHOUT CONTRAST  TECHNIQUE: Multiplanar, multisequence MR imaging of the shoulder was performed. No intravenous contrast was administered.  COMPARISON:  Radiographs dated 01/09/2015  FINDINGS: Rotator cuff:  Normal.  Muscles:  Normal.  Biceps long head: Properly located and intact. There is inflammation in the soft tissues around the bicipital tendon sheath with fluid in the bicipital tendon sheath.  Acromioclavicular Joint: Small degenerative cyst in the acromion. Otherwise normal AC joint. Type 1  acromion. No bursitis.  Glenohumeral Joint: There is a moderate glenohumeral joint effusion with extravasation of contrast from the subcoracoid recess of the joint into the adjacent soft tissues. Fluid distends the bicipital tendon sheath.  Labrum:  Intact.  Bones: No significant abnormalities. Tiny degenerative cystic areas in the posterior aspect of the greater tuberosity of the proximal humerus.  IMPRESSION: 1. Moderate glenohumeral joint effusion with some extravasation of joint. Fluid distends the bicipital tendon sheath with inflammation/extravasated joint fluid around  the bicipital tendon sheath. The possibly of synovitis should be considered. 2. Normal rotator cuff.   Electronically Signed   By: Rozetta Nunnery M.D.   On: 01/11/2015 11:21   Mr Foot Right Wo Contrast  01/11/2015   CLINICAL DATA:  Diabetic foot ulcers.  EXAM: MRI OF THE RIGHT FOREFOOT WITHOUT CONTRAST  TECHNIQUE: Multiplanar, multisequence MR imaging was performed. No intravenous contrast was administered.  COMPARISON:  Radiographs dated 01/09/2011  FINDINGS: There are small erosions of the proximal phalanx at the IP joint of the great toe and there are small erosions at the first metatarsal phalangeal joint, both consistent with gout. There are no soft tissue abscesses. There is no osteomyelitis. There is nonspecific edema in the subcutaneous fat of the dorsum of the foot. Muscles and tendons and ligaments appear appear normal. No joint effusions.  IMPRESSION: Subcutaneous edema primarily on the dorsum of the foot. 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   Electronically Signed   By: Rozetta Nunnery M.D.   On: 01/11/2015 12:50   Mr Femur Left Wo Contrast  01/12/2015   CLINICAL DATA:  Diabetes. Peripheral neuropathy. Sepsis. Bilateral thigh pain. RIGHT thigh pain. Tenderness in the medial aspect of the thigh.  EXAM: MR OF THE LEFT FEMUR WITHOUT CONTRAST  TECHNIQUE: Multiplanar, multisequence MR imaging was performed. No intravenous contrast was administered.  COMPARISON:  None.  FINDINGS: Diffuse subcutaneous edema is present around the pelvis and in both thighs, most compatible with anasarca. This could also represent cellulitis in the appropriate clinical setting. Subcutaneous fluid is present in the lateral LEFT thigh, without a discrete abscess. There is myositis of the LEFT vastus lateralis with mild muscular edema av but no significant swelling. No fatty atrophy.  Bone marrow signal shows heterogenous marrow. This is a nonspecific finding most commonly associated with obesity, anemia, cigarette smoking or chronic  disease. Negative for hip fracture or AVN. There is no evidence of osteomyelitis. No deep soft tissue abscesses are present.  Bilaterally in the groin, there are prominent lymph nodes which are probably reactive to lower extremity process.  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  IMPRESSION: 1. Diffuse subcutaneous edema in the pelvis and thighs bilaterally, suggesting anasarca. 2. More prominent subcutaneous edema along the LEFT thigh with subcutaneous fluid superficial to the muscular fascia, likely representing cellulitis. No abscess. 3. Myositis of the proximal vastus lateralis, probably infectious. Reactive edema could produce this appearance as well. 4. Reactive inguinal adenopathy bilaterally. 5. Negative for osteomyelitis.   Electronically Signed   By: Dereck Ligas M.D.   On: 01/12/2015 14:33   Mr Foot Left Wo Contrast  01/11/2015   CLINICAL DATA:  Diabetic foot ulcers.  Foot pain.  Bony erosions.  EXAM: MRI OF THE LEFT FOREFOOT WITHOUT CONTRAST  TECHNIQUE: Multiplanar, multisequence MR imaging was performed. No intravenous contrast was administered.  COMPARISON:  Radiograph dated 01/09/2015  FINDINGS: There is slight edema throughout the medial cuneiform. There is  artifactual increased signal from the metatarsal heads in from the proximal phalanx of the great toe. There are erosions at IP joint and first metatarsal phalangeal joint consistent with gout.  There are no soft tissue abscesses and there is no osteomyelitis. There is prominent edema in the subcutaneous fat of the dorsum of the foot.  IMPRESSION: No evidence of osteomyelitis or abscess. The edema in the medial cuneiform is most likely a stress reaction. 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   Electronically Signed   By: Rozetta Nunnery M.D.   On: 01/11/2015 12:15   Dg Chest Port 1 View  01/10/2015   CLINICAL DATA:  Fever and  sweats.  EXAM: PORTABLE CHEST - 1 VIEW  COMPARISON:  None.  FINDINGS: Low lung volumes with vascular crowding and streaky subsegmental atelectasis. No obvious infiltrate or effusion. The heart is within normal limits in size given the AP portable technique. The bony structures are grossly normal.  IMPRESSION: Low lung volumes with vascular crowding and bibasilar atelectasis.   Electronically Signed   By: Kalman Jewels M.D.   On: 01/10/2015 11:27   Dg Foot Complete Left  01/09/2015   CLINICAL DATA:  35 year old male with recurrent bilateral foot pain.  EXAM: LEFT FOOT - COMPLETE 3+ VIEW  COMPARISON:  01/05/2014.  FINDINGS: Large periarticular erosions are noted adjacent to the first MTP joint, and at the first interphalangeal joint along the medial margin in both locations. These have erosions have sclerotic margins and overhanging edges, and there is adjacent soft tissue prominence, suggestive of gouty erosions. No adjacent soft tissue calcification is noted. No acute displaced fracture, subluxation or dislocation. Small heterotopic ossification adjacent to the plantar aspect of the calcaneus, likely ossification within the plantar fascia.  IMPRESSION: 1. Findings, as above, suggestive of underlying gout. Clinical correlation is recommended.   Electronically Signed   By: Vinnie Langton M.D.   On: 01/09/2015 09:46   Dg Foot Complete Right  01/09/2015   CLINICAL DATA:  Recurring bilateral foot pain  EXAM: RIGHT FOOT COMPLETE - 3+ VIEW  COMPARISON:  None.  FINDINGS: No fracture or dislocation is seen.  Marginal erosion at the medial aspect of the 1st IP joint, involving the proximal phalanx.  The visualized soft tissues are unremarkable.  IMPRESSION: Marginal erosion at the medial aspect of the 1st IP joint. Correlate for inflammatory arthropathy such as gout.  Otherwise, no acute osseus abnormality is seen.   Electronically Signed   By: Julian Hy M.D.   On: 01/09/2015 09:44    Scheduled Meds: .   ceFAZolin (ANCEF) IV  2 g Intravenous Q12H  . feeding supplement (GLUCERNA SHAKE)  237 mL Oral BID BM  . feeding supplement (PRO-STAT SUGAR FREE 64)  30 mL Oral Q1500  . Influenza vac split quadrivalent PF  0.5 mL Intramuscular Tomorrow-1000  . insulin aspart  0-15 Units Subcutaneous TID WC  . insulin aspart  0-5 Units Subcutaneous QHS  . insulin aspart protamine- aspart  20 Units Subcutaneous BID WC  . living well with diabetes book   Does not apply Once  . metoprolol tartrate  25 mg Oral BID  . neomycin-bacitracin-polymyxin   Topical Daily  . nicotine  7 mg Transdermal Daily  . sodium chloride  500 mL Intravenous Once   Continuous Infusions: . sodium chloride 500 mL (01/13/15 1327)  . sodium chloride    . 0.9 % NaCl with KCl 40 mEq / L  Active Problems:   Tobacco abuse   Noncompliance   Dehydration   Diarrhea   Diabetes type 2, uncontrolled   Hyperglycemia   Uncontrolled hypertension   Diabetic foot ulcers   Pain   Diabetic ulcer of left foot associated with type 2 diabetes mellitus   Diabetic ulcer of right foot associated with type 2 diabetes mellitus   Osteomyelitis   Shoulder pain   Thigh pain, musculoskeletal   Septic shock   Staphylococcus aureus bacteremia with sepsis   Smoker   Elevated troponin   Acute renal failure   Bacteremia   Septic joint of right shoulder region   Infective myositis of left thigh   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 If 7PM-7AM, please contact night-coverage at [email or web address], password [facility] 01/13/2015, 1:31 PM  LOS: 4 days

## 2015-01-13 NOTE — Interval H&P Note (Signed)
History and Physical Interval Note:  01/13/2015 12:52 PM  Rob HickmanBenjamin Isenhower  has presented today for surgery, with the diagnosis of POSITIVE BACTEREMIA  The various methods of treatment have been discussed with the patient and family. After consideration of risks, benefits and other options for treatment, the patient has consented to  Procedure(s): TRANSESOPHAGEAL ECHOCARDIOGRAM (TEE) (N/A) as a surgical intervention .  The patient's history has been reviewed, patient examined, no change in status, stable for surgery.  I have reviewed the patient's chart and labs.  Questions were answered to the patient's satisfaction.     Belma Dyches

## 2015-01-14 ENCOUNTER — Encounter (HOSPITAL_COMMUNITY): Payer: Self-pay | Admitting: Cardiovascular Disease

## 2015-01-14 ENCOUNTER — Inpatient Hospital Stay (HOSPITAL_COMMUNITY): Payer: Medicaid Other

## 2015-01-14 DIAGNOSIS — I059 Rheumatic mitral valve disease, unspecified: Secondary | ICD-10-CM

## 2015-01-14 DIAGNOSIS — B958 Unspecified staphylococcus as the cause of diseases classified elsewhere: Secondary | ICD-10-CM

## 2015-01-14 DIAGNOSIS — B9561 Methicillin susceptible Staphylococcus aureus infection as the cause of diseases classified elsewhere: Secondary | ICD-10-CM

## 2015-01-14 DIAGNOSIS — I340 Nonrheumatic mitral (valve) insufficiency: Secondary | ICD-10-CM

## 2015-01-14 DIAGNOSIS — M6088 Other myositis, other site: Secondary | ICD-10-CM

## 2015-01-14 DIAGNOSIS — N179 Acute kidney failure, unspecified: Secondary | ICD-10-CM

## 2015-01-14 DIAGNOSIS — I330 Acute and subacute infective endocarditis: Secondary | ICD-10-CM

## 2015-01-14 LAB — COMPREHENSIVE METABOLIC PANEL
ALT: 14 U/L (ref 0–53)
AST: 23 U/L (ref 0–37)
Albumin: 1.3 g/dL — ABNORMAL LOW (ref 3.5–5.2)
Alkaline Phosphatase: 211 U/L — ABNORMAL HIGH (ref 39–117)
Anion gap: 6 (ref 5–15)
BUN: 33 mg/dL — AB (ref 6–23)
CHLORIDE: 102 meq/L (ref 96–112)
CO2: 23 mmol/L (ref 19–32)
CREATININE: 2.66 mg/dL — AB (ref 0.50–1.35)
Calcium: 7.7 mg/dL — ABNORMAL LOW (ref 8.4–10.5)
GFR calc Af Amer: 34 mL/min — ABNORMAL LOW (ref >90)
GFR, EST NON AFRICAN AMERICAN: 30 mL/min — AB (ref >90)
GLUCOSE: 241 mg/dL — AB (ref 70–99)
Potassium: 3 mmol/L — ABNORMAL LOW (ref 3.5–5.1)
Sodium: 131 mmol/L — ABNORMAL LOW (ref 135–145)
Total Bilirubin: 0.3 mg/dL (ref 0.3–1.2)
Total Protein: 5.6 g/dL — ABNORMAL LOW (ref 6.0–8.3)

## 2015-01-14 LAB — CBC
HCT: 29.1 % — ABNORMAL LOW (ref 39.0–52.0)
Hemoglobin: 9.9 g/dL — ABNORMAL LOW (ref 13.0–17.0)
MCH: 28.3 pg (ref 26.0–34.0)
MCHC: 34 g/dL (ref 30.0–36.0)
MCV: 83.1 fL (ref 78.0–100.0)
PLATELETS: 264 10*3/uL (ref 150–400)
RBC: 3.5 MIL/uL — ABNORMAL LOW (ref 4.22–5.81)
RDW: 13.7 % (ref 11.5–15.5)
WBC: 10.8 10*3/uL — AB (ref 4.0–10.5)

## 2015-01-14 LAB — GLUCOSE, CAPILLARY
GLUCOSE-CAPILLARY: 156 mg/dL — AB (ref 70–99)
GLUCOSE-CAPILLARY: 198 mg/dL — AB (ref 70–99)
GLUCOSE-CAPILLARY: 222 mg/dL — AB (ref 70–99)
GLUCOSE-CAPILLARY: 302 mg/dL — AB (ref 70–99)
Glucose-Capillary: 225 mg/dL — ABNORMAL HIGH (ref 70–99)
Glucose-Capillary: 192 mg/dL — ABNORMAL HIGH (ref 70–99)

## 2015-01-14 LAB — URINALYSIS, ROUTINE W REFLEX MICROSCOPIC
BILIRUBIN URINE: NEGATIVE
Glucose, UA: 250 mg/dL — AB
KETONES UR: NEGATIVE mg/dL
Leukocytes, UA: NEGATIVE
NITRITE: NEGATIVE
Protein, ur: >300 mg/dL — AB
Specific Gravity, Urine: 1.015 (ref 1.005–1.030)
UROBILINOGEN UA: 1 mg/dL (ref 0.0–1.0)
pH: 5.5 (ref 5.0–8.0)

## 2015-01-14 LAB — SYNOVIAL CELL COUNT + DIFF, W/ CRYSTALS
Crystals, Fluid: NONE SEEN
Eosinophils-Synovial: 0 % (ref 0–1)
Lymphocytes-Synovial Fld: 2 % (ref 0–20)
Monocyte-Macrophage-Synovial Fluid: 4 % — ABNORMAL LOW (ref 50–90)
NEUTROPHIL, SYNOVIAL: 94 % — AB (ref 0–25)
WBC, Synovial: 56100 /mm3 — ABNORMAL HIGH (ref 0–200)

## 2015-01-14 LAB — URINE MICROSCOPIC-ADD ON

## 2015-01-14 LAB — HEMOGLOBIN A1C
HEMOGLOBIN A1C: 8.4 % — AB (ref <5.7)
MEAN PLASMA GLUCOSE: 194 mg/dL — AB (ref <117)

## 2015-01-14 LAB — PROTIME-INR
INR: 1.22 (ref 0.00–1.49)
PROTHROMBIN TIME: 15.6 s — AB (ref 11.6–15.2)

## 2015-01-14 LAB — APTT: APTT: 44 s — AB (ref 24–37)

## 2015-01-14 MED ORDER — HYDROMORPHONE HCL 1 MG/ML IJ SOLN
INTRAMUSCULAR | Status: AC
  Administered 2015-01-14: 1 mg via INTRAVENOUS
  Filled 2015-01-14: qty 1

## 2015-01-14 MED ORDER — CHLORHEXIDINE GLUCONATE 0.12 % MT SOLN
15.0000 mL | Freq: Two times a day (BID) | OROMUCOSAL | Status: DC
  Administered 2015-01-14 – 2015-01-28 (×24): 15 mL via OROMUCOSAL
  Filled 2015-01-14 (×31): qty 15

## 2015-01-14 NOTE — Consult Note (Signed)
[facility] [place]  Patient name: Dwayne [name] MRN: [id number] DOB: 18-Nov-1980 Sex: male  REASON FOR CONSULT: Possible embolic disease to kidneys and left lower extremity related to endocarditis.  HPI: Dwayne [name] is a 35 y.o. 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 His creatinine on admission was 1.86. His most recent creatinine was 2.66 today. In addition, he had ABIs which suggested a diminished ABI on the left. These reasons, and given his diagnosis of bacterial endocarditis, embolization to the kidneys and/or possibly the left leg have been entertained. This reason vascular surgery was consult.  On my history, he does admit to a long history of left calf claudication. I do not get any clear-cut history of claudication on the right side or history of rest pain. He has previously had some diabetic wounds on his feet which have been treated by Dr. Doneen Poisson.   His risk factors for peripheral vascular disease include diabetes, hypertension, and tobacco use.  Past Medical History  Diagnosis Date  . Diabetes mellitus   . Hypertension   . Acute renal failure   . Bacteremia     MSSA   . Diabetes type 2, uncontrolled 01/09/2015  . Diabetic foot infection 01/15/2014  . 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 Essential hypertension, benign 01/18/2014  . Infective myositis of left thigh   . Noncompliance 01/18/2014  . Septic shock   . Smoker   . Staphylococcus aureus bacteremia with sepsis   . Tobacco abuse 01/15/2014  . Mitral regurgitation 01/13/2015    Severe by TEE   History reviewed. No pertinent family history. He is unaware of any history of premature cardiovascular disease.  SOCIAL HISTORY: History  Substance Use Topics  . Smoking status: Current Every Day Smoker    Types: Cigars  . Smokeless tobacco:  Never Used  . Alcohol Use: Yes  He tells me that he smokes 3 cigarettes a day.  No Known Allergies  REVIEW OF SYSTEMS: Arly.Keller ] denotes positive finding; [  ] denotes negative finding CARDIOVASCULAR:   chest pain    chest pressure    palpitations    orthopnea    dyspnea on exertion   Arly.Keller ] claudication    rest pain    DVT    phlebitis PULMONARY:    productive cough    asthma    wheezing NEUROLOGIC:    weakness   paresthesias   aphasia   amaurosis   dizziness HEMATOLOGIC:    bleeding problems    clotting disorders MUSCULOSKELETAL:   joint pain    joint swelling  leg swelling GASTROINTESTINAL:   blood in stool    hematemesis GENITOURINARY:    dysuria    hematuria PSYCHIATRIC:   history of major depression INTEGUMENTARY:   rashes   ulcers CONSTITUTIONAL:  Arly.Keller ] fever    chills  [X ]He does admit to some weight loss.  PHYSICAL EXAM: Filed Vitals:   01/13/15 1330 01/13/15 1357 01/13/15 2148 01/14/15 0643  BP: 168/99 150/88 154/87 153/86  Pulse: 92 92 103 96  Temp:  98.6 F (37 C) 99.7 F (37.6 C) 99.1 F (37.3 C)  TempSrc:   Oral Oral  Resp: 23 20 18  18  Height:      Weight:      SpO2: 100% 100% 100% 100%   Body mass index is 29.06 kg/(m^2). GENERAL: The patient is a well-nourished male, in no acute distress. The vital signs are documented above. CARDIOVASCULAR: There is a regular rate and rhythm. I do not detect carotid bruits. He has palpable radial pulses bilaterally. On the right lower extremity, he has a palpable femoral, popliteal, and dorsalis pedis pulse. On the left side, he has a palpable femoral and popliteal pulse. I cannot palpate pedal pulses. The left lower extremity is swollen. PULMONARY: There is good air exchange bilaterally without wheezing or rales. ABDOMEN: Soft and non-tender with normal pitched bowel sounds.  MUSCULOSKELETAL: There are no major deformities or  cyanosis. NEUROLOGIC: No focal weakness or paresthesias are detected. SKIN: There are no ulcers or rashes noted. PSYCHIATRIC: The patient seems somewhat lethargic.  DATA:  Lab Results  Component Value Date   WBC 10.8* 01/14/2015   HGB 9.9* 01/14/2015   HCT 29.1* 01/14/2015   MCV 83.1 01/14/2015   PLT 264 01/14/2015   Lab Results  Component Value Date   NA 131* 01/14/2015   K 3.0* 01/14/2015   CL 102 01/14/2015   CO2 23 01/14/2015   Lab Results  Component Value Date   CREATININE 2.66* 01/14/2015   Lab Results  Component Value Date   INR 1.22 01/14/2015   Lab Results  Component Value Date   HGBA1C 8.3* 01/12/2015   CBG (last 3)   Recent Labs  01/14/15 0436 01/14/15 0959 01/14/15 1148  GLUCAP 225* 302* 198*   ABIs: I have reviewed his ABIs which were performed on 01/11/2015. This showed a normal ABI  (100%) on the right with an ABI on the left of 64%.  TEE: This shows that there is an infectious aneurysm and perforation at the base of the posterior mitral leaflet. There is a 4 mm x 6 mm vegetation.  Blood cultures have grown staph aureus.  RENAL ULTRASOUND: right kidney measures 12.8 cm in length. There is increased cortical echogenicity consistent with medical renal disease. The left kidney measures 12.6 cm in length. There is increased echogenicity suspicious for medical renal insufficiency.  MEDICAL ISSUES:  * DECREASED LEFT ABI: Given that the patient has bacterial endocarditis, then embolization to the left leg is possible. However, by my history the patient has a long history of left lower extremity claudication, which more likely explains his diminished ABI on the left. 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 At this point, I do not think there would be any utility in a more aggressive workup of his decreased ABI on the left. He is on intravenous Ancef. It does not look like he is on Lovenox and I  think this would be helpful if there are no other contraindications from a medical standpoint.   * The left lower extremity is swollen. I have ordered a venous duplex scan to rule out DVT.  * RENAL INSUFFICIENCY: His renal function is improving and he is being followed by nephrology. I agree that embolization could be considered in the differential diagnosis of his acute on chronic renal failure. However, this would be difficult to diagnose without arteriography which would be less than ideal given his renal insufficiency. I do not think a CO2 arteriogram would get adequate visualization. He's being treated with intravenous Ancef. In reviewing his current active medications I do  not think that he is on Lovenox which I think would be reasonable if there are no other contraindications.  DICKSON,CHRISTOPHER S [facility] [place] Beeper: [phone]

## 2015-01-14 NOTE — Progress Notes (Signed)
Patient ID: Dwayne HickmanBenjamin Guillot, male   DOB: 10/31/1980, 35 y.o.   MRN: [id number] Mr. Hardie PulleyStevenson's right shoulder aspiration did show 59,000 WBC likely consistent with infection.  He does have right shoulder pain on motion.  Infectious Disease is recommending an I&D.  He is on IV antibiotics.  I spoke to him about the possibility of surgery, but he is so sleepy he states he is having trouble understanding what I am telling him and seems confused.  Given that as well as his overall medical status, I do not fel that he needs an urgent I&D.  I would be more comfortable if he could give informed consent and was less fragile medically.  The aspiration certainly does help in terms of getting fluid off of his shoulder and the IV antibiotics are helping.  I will continue to follow and check on him closely.

## 2015-01-14 NOTE — Progress Notes (Signed)
Dwayne [name]   Subjective:   Interval History: renal function is improving   Objective:  Vital signs in last 24 hours:  Temp:  [98.6 F (37 C)-99.7 F (37.6 C)] 99.1 F (37.3 C) (01/15 0643) Pulse Rate:  [92-103] 96 (01/15 0643) Resp:  [18-20] 18 (01/15 0643) BP: (150-154)/(86-88) 153/86 mmHg (01/15 0643) SpO2:  [100 %] 100 % (01/15 0643)  Weight change:  Filed Weights   01/09/15 0017 01/09/15 0329  Weight: 99.791 kg (220 lb) 102.694 kg (226 lb 6.4 oz)    Intake/Output: I/O last 3 completed shifts: In: 1 [P.O.:400; I.V.:100; IV Piggyback:150] Out: 1750 [Urine:1750]   Intake/Output this shift:  Total I/O In: -  Out: 750 [ENIDP:824]  CVS- RRR Systolic murmur RS- CTA ABD- BS present soft non-distended EXT- no edema Ulcerations on Plantar Surface of Both Feet.  Basic Metabolic Panel:  Recent Labs Lab 01/09/15 0500 01/10/15 0508 01/10/15 1121 01/11/15 0235 01/11/15 0953 01/12/15 0614 01/13/15 0503 01/14/15 0518  NA 136 132*  --  131*  --  129* 134* 131*  K 3.0* 3.0*  --  3.6  --  3.0* 3.1* 3.0*  CL 105 103  --  100  --  98 101 102  CO2 23 23  --  21  --  _0 GLUCOSE 253* 103*  --  172*  --  97 123* 241*  BUN 17 21  --  29*  --  34* 37* 33*  CREATININE 1.86* 2.15*  --  2.52*  --  3.15* 3.05* 2.66*  CALCIUM 7.7* 8.0*  --  8.0*  --  7.5* 8.0* 7.7*  MG 1.5  --  1.6  --  1.9  --  2.3  --     Liver Function Tests:  Recent Labs Lab 01/09/15 0021 01/11/15 0235 01/12/15 0614 01/13/15 0503 01/14/15 0518  AST _1 ALT _2 ALKPHOS 86 80 81 151* 211*  BILITOT 0.6 0.6 0.6 0.2* 0.3  PROT 6.0 6.3 5.0* 5.7* 5.6*  ALBUMIN 2.1* 1.7* 1.4* 1.4* 1.3*   No results for input(s): LIPASE, AMYLASE in the last 168 hours. No results for input(s): AMMONIA in the last 168 hours.  CBC:  Recent Labs Lab 01/09/15 0021  01/10/15 0508 01/11/15 0235 01/12/15 0614 01/13/15 0503 01/14/15 0518  WBC 11.3*  < >  13.8* 15.3* 13.4* 13.4* 10.8*  NEUTROABS 9.3*  --   --   --   --   --   --   HGB 11.7*  < > 12.2* 12.1* 10.3* 10.6* 9.9*  HCT 34.7*  < > 35.1* 34.8* 30.8* 31.4* 29.1*  MCV 82.6  < > 81.6 81.3 82.1 82.4 83.1  PLT 179  < > 126* 153 169 243 264  < > = values in this interval not displayed.  Cardiac Enzymes:  Recent Labs Lab 01/10/15 2036 01/11/15 0235 01/12/15 0614 01/12/15 1130 01/12/15 1849  CKTOTAL  --   --  49  --   --   TROPONINI 0.33* 0.28* 0.84* 0.65* 0.47*    BNP: Invalid input(s): POCBNP  CBG:  Recent Labs Lab 01/13/15 2153 01/14/15 0043 01/14/15 0436 01/14/15 0959 01/14/15 1148  GLUCAP 273* 222* 225* 302* 198*    Microbiology: Results for orders placed or performed during the hospital encounter of 01/09/15  Urine culture     Status: None   Collection Time: 01/09/15  1:31 AM  Result Value Ref Range Status  Specimen Description URINE, CLEAN CATCH  Final   Special Requests NONE  Final   Colony Count   Final    3,000 COLONIES/ML Performed at [facility]    Culture   Final    INSIGNIFICANT GROWTH Performed at [facility]    Report Status 01/10/2015 FINAL  Final  Stool culture     Status: None   Collection Time: 01/09/15  4:09 AM  Result Value Ref Range Status   Specimen Description VAGINAL/RECTAL  Final   Special Requests Normal  Final   Culture   Final    NO SALMONELLA, SHIGELLA, CAMPYLOBACTER, YERSINIA, OR E.COLI 0157:H7 ISOLATED Performed at [facility]    Report Status 01/12/2015 FINAL  Final  Clostridium Difficile by PCR     Status: None   Collection Time: 01/09/15  4:09 AM  Result Value Ref Range Status   C difficile by pcr NEGATIVE NEGATIVE Final  Culture, blood (routine x 2)     Status: None   Collection Time: 01/10/15  4:35 AM  Result Value Ref Range Status   Specimen Description BLOOD RIGHT HAND  Final   Special Requests BOTTLES DRAWN AEROBIC AND ANAEROBIC 10CC EA  Final   Culture   Final    STAPHYLOCOCCUS  AUREUS [name]: SUSCEPTIBILITIES PERFORMED ON PREVIOUS CULTURE WITHIN THE LAST 5 DAYS. [name]: Culture results may be compromised due to an excessive volume of blood received in culture bottles. Gram Stain Report Called to,Read Back By and Verified With:  MONICA YIM ON 01/10/2015 AT 11:36P BY WILEJ Performed at [facility]    Report Status 01/13/2015 FINAL  Final  Culture, blood (routine x 2)     Status: None   Collection Time: 01/10/15  4:46 AM  Result Value Ref Range Status   Specimen Description BLOOD LEFT ARM  Final   Special Requests BOTTLES DRAWN AEROBIC AND ANAEROBIC 10CC EA  Final   Culture   Final    STAPHYLOCOCCUS AUREUS [name]: SUSCEPTIBILITIES PERFORMED ON PREVIOUS CULTURE WITHIN THE LAST 5 DAYS. [name]: Culture results may be compromised due to an excessive volume of blood received in culture bottles. Gram Stain Report Called to,Read Back By and Verified With: MONICA YIM ON 01/10/2015 AT 11:36P BY WILEJ Performed at [facility]    Report Status 01/13/2015 FINAL  Final  Respiratory virus panel (routine influenza)     Status: None   Collection Time: 01/10/15 10:34 AM  Result Value Ref Range Status   Source - RVPAN NASAL SWAB  Corrected    Comment: CORRECTED ON 01/12 AT 2036: PREVIOUSLY REPORTED AS NASAL SWAB   Respiratory Syncytial Virus A NOT DETECTED  Final   Respiratory Syncytial Virus B NOT DETECTED  Final   Influenza A NOT DETECTED  Final   Influenza B NOT DETECTED  Final   Parainfluenza 1 NOT DETECTED  Final   Parainfluenza 2 NOT DETECTED  Final   Parainfluenza 3 NOT DETECTED  Final   Metapneumovirus NOT DETECTED  Final   Rhinovirus NOT DETECTED  Final   Adenovirus NOT DETECTED  Final   Influenza A H1 NOT DETECTED  Final   Influenza A H3 NOT DETECTED  Final    Comment: ([name])       Normal Reference Range for each Analyte: NOT DETECTED Testing performed using the Luminex xTAG Respiratory Viral Panel test kit. The analytical performance characteristics of  this assay have been determined by [facility].  The modifications have not been cleared or   approved by the FDA. This assay has been validated pursuant to the CLIA regulations and is used for clinical purposes. Performed at [facility]   Culture, blood (routine x 2)     Status: None   Collection Time: 01/10/15 11:19 AM  Result Value Ref Range Status   Specimen Description BLOOD LEFT FOREARM  Final   Special Requests BOTTLES DRAWN AEROBIC ONLY 5CC  Final   Culture   Final    STAPHYLOCOCCUS AUREUS [name]: RIFAMPIN AND GENTAMICIN SHOULD NOT BE USED AS SINGLE DRUGS FOR TREATMENT OF STAPH INFECTIONS. [name]: Gram Stain Report Called to,Read Back By and Verified With: NURSE MONICA YIM 01/11/15 6:05AM THOMI Performed at [facility]    Report Status 01/13/2015 FINAL  Final   Organism ID, Bacteria STAPHYLOCOCCUS AUREUS  Final      Susceptibility   Staphylococcus aureus - MIC*    CLINDAMYCIN <=0.25 SENSITIVE Sensitive     ERYTHROMYCIN <=0.25 SENSITIVE Sensitive     GENTAMICIN <=0.5 SENSITIVE Sensitive     LEVOFLOXACIN 4 INTERMEDIATE Intermediate     OXACILLIN 0.5 SENSITIVE Sensitive     PENICILLIN 0.12 SENSITIVE Sensitive     RIFAMPIN 2 INTERMEDIATE Intermediate     TRIMETH/SULFA <=10 SENSITIVE Sensitive     VANCOMYCIN 1 SENSITIVE Sensitive     TETRACYCLINE <=1 SENSITIVE Sensitive     MOXIFLOXACIN 2 RESISTANT Resistant     * STAPHYLOCOCCUS AUREUS  Culture, blood (routine x 2)     Status: None   Collection Time: 01/10/15 11:27 AM  Result Value Ref Range Status   Specimen Description BLOOD LEFT ANTECUBITAL  Final   Special Requests BOTTLES DRAWN AEROBIC ONLY 7CC  Final   Culture   Final    STAPHYLOCOCCUS AUREUS [name]: SUSCEPTIBILITIES PERFORMED ON PREVIOUS CULTURE WITHIN THE LAST 5 DAYS. [name]: Gram Stain Report Called to,Read Back By and Verified With: NURSE MONICA YIM 01/11/15 6:05AM THOMI Performed at [facility]    Report Status 01/13/2015 FINAL  Final     Coagulation Studies:  Recent Labs  01/14/15 0518  LABPROT 15.6*  INR 1.22    Urinalysis:  Recent Labs  01/14/15 0508  COLORURINE YELLOW  LABSPEC 1.015  PHURINE 5.5  GLUCOSEU 250*  HGBUR MODERATE*  BILIRUBINUR NEGATIVE  KETONESUR NEGATIVE  PROTEINUR >300*  UROBILINOGEN 1.0  NITRITE NEGATIVE  LEUKOCYTESUR NEGATIVE      Imaging: Dg Chest 2 View  01/14/2015   CLINICAL DATA:  Right shoulder pain for 6 days.  EXAM: CHEST  2 VIEW  COMPARISON:  01/10/2015  FINDINGS: Two views of the chest demonstrate low lung volumes with bibasilar chest densities, left side greater than right. In addition, the heart appears to be enlarged. The configuration of the heart raises the possibility for pericardial fluid. No evidence for pulmonary edema. Difficult to exclude small effusions, particularly on the left side.  IMPRESSION: Low lung volumes with bibasilar densities, left side greater than right. Basilar densities could represent a combination of atelectasis and pleural fluid.  Heart appears enlarged which may be accentuated by low lung volumes. Pericardial fluid cannot be excluded.   Electronically Signed   By: Adam  Henn M.D.   On: 01/14/2015 08:17   Us Renal  01/12/2015   CLINICAL DATA:  Acute renal failure  EXAM: RENAL/URINARY TRACT ULTRASOUND COMPLETE  COMPARISON:  None.  FINDINGS: Right Kidney:  Length: 12.8 cm in length. Diffuse increased cortical echogenicity suspicious for medical renal disease. No hydronephrosis. No renal calculus.  Left Kidney:  Length: .   12.6 cm in length. Diffuse increased echogenicity suspicious for medical renal disease. No hydronephrosis. No diagnostic renal calculus.  Bladder:  Appears normal for degree of bladder distention.  IMPRESSION: Bilateral kidneys shows diffuse increased echogenicity. Medical renal disease cannot be excluded. No hydronephrosis or diagnostic renal calculus.   Electronically Signed   By: Liviu  Pop M.D.   On: 01/12/2015 16:08   Mr Femur  Left Wo Contrast  01/12/2015   CLINICAL DATA:  Diabetes. Peripheral neuropathy. Sepsis. Bilateral thigh pain. RIGHT thigh pain. Tenderness in the medial aspect of the thigh.  EXAM: MR OF THE LEFT FEMUR WITHOUT CONTRAST  TECHNIQUE: Multiplanar, multisequence MR imaging was performed. No intravenous contrast was administered.  COMPARISON:  None.  FINDINGS: Diffuse subcutaneous edema is present around the pelvis and in both thighs, most compatible with anasarca. This could also represent cellulitis in the appropriate clinical setting. Subcutaneous fluid is present in the lateral LEFT thigh, without a discrete abscess. There is myositis of the LEFT vastus lateralis with mild muscular edema av but no significant swelling. No fatty atrophy.  Bone marrow signal shows heterogenous marrow. This is a nonspecific finding most commonly associated with obesity, anemia, cigarette smoking or chronic disease. Negative for hip fracture or AVN. There is no evidence of osteomyelitis. No deep soft tissue abscesses are present.  Bilaterally in the groin, there are prominent lymph nodes which are probably reactive to lower extremity process.  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  IMPRESSION: 1. Diffuse subcutaneous edema in the pelvis and thighs bilaterally, suggesting anasarca. 2. More prominent subcutaneous edema along the LEFT thigh with subcutaneous fluid superficial to the muscular fascia, likely representing cellulitis. No abscess. 3. Myositis of the proximal vastus lateralis, probably infectious. Reactive edema could produce this appearance as well. 4. Reactive inguinal adenopathy bilaterally. 5. Negative for osteomyelitis.   Electronically Signed   By: Geoffrey  Lamke M.D.   On: 01/12/2015 14:33   Dg Fluoro Guide Ndl Plc/bx  01/14/2015   CLINICAL DATA:  Right shoulder pain and effusion.  Bacteremia.  EXAM: RIGHT SHOULDER ASPIRATION UNDER  FLUOROSCOPY  FLUOROSCOPY TIME:  0 min 24 seconds  PROCEDURE: Overlying skin prepped with Betadine, draped in the usual sterile fashion, and infiltrated locally with buffered Lidocaine. Twenty gauge spinal needle was placed in the right glenohumeral joint under direct fluoroscopic visualization.  2 cc of bloody cloudy fluid was aspirated from the joint. Mr. Raczynski tolerated the procedure well.  IMPRESSION: Technically successful right shoulder aspiration under fluoroscopy. Fluid sent for laboratory analysis.   Electronically Signed   By: Jim  Maxwell M.D.   On: 01/14/2015 09:30     Medications:   . 0.9 % NaCl with KCl 40 mEq / L 100 mL/hr (01/14/15 0852)   .  ceFAZolin (ANCEF) IV  2 g Intravenous 3 times per day  . chlorhexidine  15 mL Mouth/Throat BID  . feeding supplement (GLUCERNA SHAKE)  237 mL Oral BID BM  . feeding supplement (PRO-STAT SUGAR FREE 64)  30 mL Oral Q1500  . Influenza vac split quadrivalent PF  0.5 mL Intramuscular Tomorrow-1000  . insulin aspart  0-15 Units Subcutaneous TID WC  . insulin aspart  0-5 Units Subcutaneous QHS  . insulin aspart protamine- aspart  20 Units Subcutaneous BID WC  . living well with diabetes book   Does not apply Once  . metoprolol tartrate  25 mg Oral BID  . neomycin-bacitracin-polymyxin   Topical Daily  .   nicotine  7 mg Transdermal Daily  . sodium chloride  500 mL Intravenous Once   acetaminophen **OR** acetaminophen, alum & mag hydroxide-simeth, hydrALAZINE, HYDROmorphone (DILAUDID) injection, ondansetron **OR** ondansetron (ZOFRAN) IV, oxyCODONE  Assessment/ Plan:  Dwayne [name] is a 35 y.o. male with a history of Uncontrolled HTN, DM2 who presents to the ED with complaints of diarrhea and fevers and chills x 24 hours. He reports having weakness and fatigue and He denies any nausea or vomiting. No one is sick at home, and he denies taking any recent antibiotic therapy. He reports that he has been out of his medications for months,  and has not seen his PCP in months as well. 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 Was found to have staph bacteremia. Baseline creatinine was normal although creatinine was elevated on admission. No contrast administration and no NSAIDS. Received vancomycin. Outpatient medications included lisinopril and metformin  Staph bacteremia endocarditis evaluation and recommendations by ID, VVS and TCTS  Acute renal failure or acute on chronic renal failure in setting of staph aureus bacteremia. This may be ATN secondary to sepsis with underlying diabetic nephropathy and chronic renal disease normal U/S and creatinine improved  Anemia no ESA at present would not use Iron is setting in infection  Hypokalemia Replace with potassium         Hyponatremia secondary to ARF     LOS: 5 Dwayne [name] W _0 _1 :33 PM

## 2015-01-14 NOTE — Progress Notes (Signed)
Inpatient Diabetes Program Recommendations  AACE/ADA: New Consensus Statement on Inpatient Glycemic Control (2013)  Target Ranges:  Prepandial:   less than 140 mg/dL      Peak postprandial:   less than 180 mg/dL (1-2 hours)      Critically ill patients:  140 - 180 mg/dL    Glucose levels climbing in 300's. May want to consider increase in 70/30 insulin to home dose of 25 units bid (kidney function noted as improving) Pt getting correction scale tidwc and HS scale as well.  Thank you, Lenor CoffinAnn Erie Radu, RN, CNS, Diabetes Coordinator [phone]

## 2015-01-14 NOTE — Consult Note (Signed)
[address]       [name] [address]             [phone]          CARDIOTHORACIC SURGERY CONSULTATION REPORT  PCP is No PCP Per Patient Referring Provider is Donato Schultz, MD  Reason for consultation:  Bacterial endocarditis  HPI:  Patient is a 35 year old African-American male from [place] with history of long-standing hypertension, type 2 diabetes mellitus with complication, chronic diabetic ulcers on the plantar surface of both feet, and medical noncompliance who was admitted to the hospital 01/09/2015 with severe generalized weakness and fatigue for several weeks and a 24 hour history of fevers and chills and diarrhea.  The patient also complained of recent onset of diffuse myalgias and pain localized to the right shoulder and the left thigh.  2 of 2 sets of blood cultures obtained 01/10/2015 grew Staphylococcus aureus sensitive to both penicillin and oxacillin.  Transthoracic echocardiogram obtained 01/11/2015 did not reveal any signs of vegetations nor significant valvular disease, but transesophageal echocardiogram performed 01/13/2015 revealed clear vegetation on the posterior leaflet of the mitral valve and severe mitral regurgitation. Cardiothoracic surgical consultation was requested.  The patient is single and reports living locally in [place] with his girlfriend. He has other family that reportedly lives nearby. He has a 63-year-old daughter that does not live with him regularly. The patient has been incarcerated in prison in the past. The patient has a long-standing history of tobacco abuse and currently smokes at least half pack of cigarettes daily. The patient denies history of excessive alcohol use or illicit drug use. The patient denies any symptoms of shortness of breath either with activity or rest. He denies any symptoms of PND, orthopnea, palpitations, dizzy spells, or syncope. He has had some atypical tightness across his chest. He continues  to experience pain in the right shoulder and the left thigh. He has not been up and out of bed ambulating much at all since admission. His appetite is fair. His bowel function is normal. He denies any abdominal pain. He has had some headaches without unilateral localizing symptoms or visual disturbances. He states that he is no longer having fevers or chills.  Past Medical History  Diagnosis Date  . Diabetes mellitus   . Hypertension   . Acute renal failure   . Bacteremia     MSSA   . Diabetes type 2, uncontrolled 01/09/2015  . Diabetic foot infection 01/15/2014  . 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 Essential hypertension, benign 01/18/2014  . Infective myositis of left thigh   . Noncompliance 01/18/2014  . Septic shock   . Smoker   . Staphylococcus aureus bacteremia with sepsis   . Tobacco abuse 01/15/2014  . Mitral regurgitation 01/13/2015    Severe by TEE    Past Surgical History  Procedure Laterality Date  . I&d extremity Bilateral 01/16/2014    Procedure:  DEBRIDEMENT of bilateral foot ulcers;  Surgeon: Kathryne Hitch, MD;  Location: [facility] OR;  Service: Orthopedics;  Laterality: Bilateral;  . Tee without cardioversion N/A 01/13/2015    Procedure: TRANSESOPHAGEAL ECHOCARDIOGRAM (TEE);  Surgeon: Thurmon Fair, MD;  Location: [facility] ENDOSCOPY;  Service: Cardiovascular;  Laterality: N/A;    History reviewed. No pertinent family history.  History   Social History  . Marital Status: Married    Spouse Name: N/A    Number of Children: N/A  . Years of Education: N/A   Occupational History  .  Not on file.   Social History Main Topics  . Smoking status: Current Every Day Smoker    Types: Cigars  . Smokeless tobacco: Never Used  . Alcohol Use: Yes  . Drug Use: No  . Sexual Activity: Not on file   Other Topics Concern  . Not on file   Social History Narrative    Prior to Admission medications   Medication Sig Start Date End Date Taking? Authorizing Provider    oxyCODONE (OXY IR/ROXICODONE) 5 MG immediate release tablet Take 1 tablet (5 mg total) by mouth every 4 (four) hours as needed for moderate pain or severe pain. 03/16/14  Yes Dorothea Ogle, MD  ciprofloxacin (CIPRO) 500 MG tablet Take 1 tablet (500 mg total) by mouth 2 (two) times daily. X 2 weeks Patient not taking: Reported on 01/09/2015 01/19/14   Ripudeep Jenna Luo, MD  doxycycline (VIBRA-TABS) 100 MG tablet Take 1 tablet (100 mg total) by mouth 2 (two) times daily. X 2 weeks Patient not taking: Reported on 01/09/2015 01/19/14   Ripudeep Jenna Luo, MD  furosemide (LASIX) 20 MG tablet Take 1 tablet (20 mg total) by mouth daily. Patient not taking: Reported on 01/09/2015 03/16/14   Dorothea Ogle, MD  insulin aspart protamine- aspart (NOVOLOG MIX 70/30) (70-30) 100 UNIT/ML injection Inject 0.25 mLs (25 Units total) into the skin 2 (two) times daily with a meal. Patient not taking: Reported on 01/09/2015 01/28/14   Quentin Angst, MD  Insulin Syringe-Needle U-100 28G X 1/2" 0.5 ML MISC Use as directed. Any generic available. Patient not taking: Reported on 01/09/2015 01/28/14   Quentin Angst, MD  Lancets (FREESTYLE) lancets Any generic available. Use as instructed Patient not taking: Reported on 01/09/2015 01/28/14   Quentin Angst, MD  lisinopril (PRINIVIL,ZESTRIL) 20 MG tablet Take 1 tablet (20 mg total) by mouth daily. Patient not taking: Reported on 01/09/2015 03/16/14   Dorothea Ogle, MD  metFORMIN (GLUCOPHAGE) 500 MG tablet Take 1 tablet (500 mg total) by mouth 2 (two) times daily with a meal. Patient not taking: Reported on 01/09/2015 03/16/14   Dorothea Ogle, MD    Current Facility-Administered Medications  Medication Dose Route Frequency Provider Last Rate Last Dose  . 0.9 % NaCl with KCl 40 mEq / L  infusion   Intravenous Continuous Richarda Overlie, MD 100 mL/hr at 01/14/15 0852 100 mL/hr at 01/14/15 0852  . acetaminophen (TYLENOL) tablet 650 mg  650 mg Oral Q6H PRN Ron Parker, MD   650  mg at 01/13/15 0423   Or  . acetaminophen (TYLENOL) suppository 650 mg  650 mg Rectal Q6H PRN Ron Parker, MD      . alum & mag hydroxide-simeth (MAALOX/MYLANTA) 200-200-20 MG/5ML suspension 30 mL  30 mL Oral Q6H PRN Ron Parker, MD      . ceFAZolin (ANCEF) IVPB 2 g/50 mL premix  2 g Intravenous 3 times per day Richarda Overlie, MD   2 g at 01/14/15 0531  . feeding supplement (GLUCERNA SHAKE) (GLUCERNA SHAKE) liquid 237 mL  237 mL Oral BID BM Stephanie La, RD   237 mL at 01/13/15 1513  . feeding supplement (PRO-STAT SUGAR FREE 64) liquid 30 mL  30 mL Oral Q1500 Marijean Niemann, RD   30 mL at 01/12/15 1829  . hydrALAZINE (APRESOLINE) injection 10 mg  10 mg Intravenous Q6H PRN Ron Parker, MD   10 mg at 01/10/15 0654  . HYDROmorphone (DILAUDID) injection 1 mg  1 mg Intravenous Q3H PRN Ripudeep Jenna Luo, MD   1 mg at 01/14/15 0755  . Influenza vac split quadrivalent PF (FLUARIX) injection 0.5 mL  0.5 mL Intramuscular Tomorrow-1000 Ripudeep K Rai, MD   0.5 mL at 01/10/15 1000  . insulin aspart (novoLOG) injection 0-15 Units  0-15 Units Subcutaneous TID WC Ripudeep Jenna Luo, MD   11 Units at 01/14/15 1009  . insulin aspart (novoLOG) injection 0-5 Units  0-5 Units Subcutaneous QHS Ripudeep Jenna Luo, MD   3 Units at 01/13/15 2159  . insulin aspart protamine- aspart (NOVOLOG MIX 70/30) injection 20 Units  20 Units Subcutaneous BID WC Ripudeep Jenna Luo, MD   20 Units at 01/14/15 0800  . living well with diabetes book MISC   Does not apply Once Richarda Overlie, MD      . metoprolol tartrate (LOPRESSOR) tablet 25 mg  25 mg Oral BID Ripudeep Jenna Luo, MD   25 mg at 01/14/15 0958  . neomycin-bacitracin-polymyxin (NEOSPORIN) ointment   Topical Daily Richarda Overlie, MD      . nicotine (NICODERM CQ - dosed in mg/24 hr) patch 7 mg  7 mg Transdermal Daily Ron Parker, MD   7 mg at 01/09/15 0855  . ondansetron (ZOFRAN) tablet 4 mg  4 mg Oral Q6H PRN Ron Parker, MD   4 mg at 01/10/15 0012   Or  . ondansetron  (ZOFRAN) injection 4 mg  4 mg Intravenous Q6H PRN Ron Parker, MD   4 mg at 01/11/15 0721  . oxyCODONE (Oxy IR/ROXICODONE) immediate release tablet 5 mg  5 mg Oral Q4H PRN Ripudeep Jenna Luo, MD   5 mg at 01/13/15 0029  . sodium chloride 0.9 % bolus 500 mL  500 mL Intravenous Once Richarda Overlie, MD        No Known Allergies    Review of Systems:   General:  fair appetite, decreased energy, no weight gain, no weight loss, + fever  Cardiac:  no chest pain with exertion, atypical mild chest pain at rest, no SOB with exertion, no resting SOB, no PND, no orthopnea, no palpitations, no arrhythmia, no atrial fibrillation, + bilateral LE edema, no dizzy spells, no syncope  Respiratory:  no shortness of breath, no home oxygen, no productive cough, no dry cough, no bronchitis, no wheezing, no hemoptysis, no asthma, + pain with inspiration or cough, no sleep apnea, no CPAP at night  GI:   no difficulty swallowing, no reflux, no frequent heartburn, no hiatal hernia, no abdominal pain, no constipation, no diarrhea, no hematochezia, no hematemesis, no melena  GU:   no dysuria,  no frequency, no urinary tract infection, no hematuria, no enlarged prostate, no kidney stones, + kidney disease  Vascular:  no pain suggestive of claudication, + pain in left thigh, no leg cramps, no varicose veins, no DVT, + non-healing foot ulcer  Neuro:   no stroke, no TIA's, no seizures, + headaches, no temporary blindness one eye,  no slurred speech, no peripheral neuropathy, no chronic pain, no instability of gait, no memory/cognitive dysfunction  Musculoskeletal: no arthritis, + joint swelling, + myalgias, no difficulty walking, normal mobility   Skin:   no rash, no itching, no skin infections, + pressure sores or ulcerations  Psych:   no anxiety, no depression, no nervousness, no unusual recent stress  Eyes:   + blurry vision, no floaters, no recent vision changes, does not wear glasses or contacts  ENT:   no hearing loss,  +  loose or painful teeth, no dentures, last saw dentist many years ago, poor dentition  Hematologic:  no easy bruising, no abnormal bleeding, no clotting disorder, no frequent epistaxis  Endocrine:  + diabetes, does not check CBG's at home     Physical Exam:   BP 153/86 mmHg  Pulse 96  Temp(Src) 99.1 F (37.3 C) (Oral)  Resp 18  Ht 6\' 2"  (1.88 m)  Wt 102.694 kg (226 lb 6.4 oz)  BMI 29.06 kg/m2  SpO2 100%  General:  Somewhat unusual affect, but answers questions appropriately and no discomfort nor distress  HEENT:  Unremarkable except poor dentition  Neck:   no JVD, no bruits, no adenopathy   Chest:   clear to auscultation, symmetrical breath sounds, no wheezes, no rhonchi   CV:   RRR, soft grade II/VI systolic murmur   Abdomen:  soft, non-tender, no masses   Extremities:  warm, adequately-perfused, pulses palpable RLE in DP, no palpable pulses in ankle on left, + bilateral lower extremity edema L>R  Rectal/GU  Deferred  Neuro:   Grossly non-focal and symmetrical throughout  Skin:   Clean and dry, no rashes, + breakdown plantar surface of both feet  Diagnostic Tests:  BLOOD CULTURES  2 of 2 sets drawn 01/10/2015 grew STAPHYLOCOCCUS AUREUS sensitive to Penicillin and Oxacillin Repeat blood cultures drawn 01/13/2015 pending Right shoulder aspiration culture 01/14/2015 pending   Transthoracic Echocardiography  Patient:  Kern, Gingras MR #:    [id number] Study Date: 01/11/2015 Gender:   M Age:    34 Height:   188 cm Weight:   103.9 kg BSA:    2.35 m^2 Pt. Status: Room:    5N30C  ADMITTING  Della Goo C SONOGRAPHER Laurice Record [id number] PERFORMING  [facility], Inpatient  cc:  ------------------------------------------------------------------- LV EF: 55% -  60%  ------------------------------------------------------------------- Indications:   Chest pain  786.51.  ------------------------------------------------------------------- History:  PMH: Dehydration. Shoulder pain. Thigh pain. Noncompliance. Risk factors: Current tobacco use. Hypertension. Diabetes mellitus.  ------------------------------------------------------------------- Study Conclusions  - Left ventricle: The cavity size was normal. There was severe focal basal and moderate concentric hypertrophy of the left ventricle. Systolic function was normal. The estimated ejection fraction was in the range of 55% to 60%. Wall motion was normal; there were no regional wall motion abnormalities. Doppler parameters are consistent with abnormal left ventricular relaxation (grade 1 diastolic dysfunction). There was no evidence of elevated ventricular filling pressure by Doppler parameters. - Aortic valve: There was no regurgitation. - Aortic root: The aortic root was normal in size. - Mitral valve: Structurally normal valve. Transvalvular velocity was within the normal range. There was no evidence for stenosis. There was trivial regurgitation. - Left atrium: The atrium was normal in size. - Right ventricle: Systolic function was normal. - Right atrium: The atrium was normal in size. - Tricuspid valve: There was no regurgitation. - Pulmonic valve: There was trivial regurgitation. - Pulmonary arteries: Systolic pressure couldn&'t be assessed as there was no tricuspid regurgitation. - Pericardium, extracardiac: A trivial pericardial effusion was identified posterior to the heart. Features were not consistent with tamponade physiology.  Impressions:  - A cardiac MRI should be considered for the evaluation of infiltrative cardiomyopathy.  Transthoracic echocardiography. M-mode, complete 2D, spectral Doppler, and color Doppler. Birthdate: Patient birthdate: 03-21-1980. Age: Patient is 35 yr old. Sex: Gender: male. BMI: 29.4 kg/m^2. Blood  pressure:   110/62 Patient status: Inpatient. Study date: Study date: 01/11/2015. Study time: 12:05 PM. Location: Echo laboratory.  -------------------------------------------------------------------  -------------------------------------------------------------------  Left ventricle: The cavity size was normal. There was severe focal basal and moderate concentric hypertrophy of the left ventricle. Systolic function was normal. The estimated ejection fraction was in the range of 55% to 60%. Wall motion was normal; there were no regional wall motion abnormalities. Doppler parameters are consistent with abnormal left ventricular relaxation (grade 1 diastolic dysfunction). There was no evidence of elevated ventricular filling pressure by Doppler parameters.  ------------------------------------------------------------------- Aortic valve:  Trileaflet; normal thickness leaflets. Mobility was not restricted. Doppler: Transvalvular velocity was within the normal range. There was no stenosis. There was no regurgitation.  ------------------------------------------------------------------- Aorta: Aortic root: The aortic root was normal in size.  ------------------------------------------------------------------- Mitral valve:  Structurally normal valve.  Mobility was not restricted. Doppler: Transvalvular velocity was within the normal range. There was no evidence for stenosis. There was trivial regurgitation.  Peak gradient (D): 2 mm Hg.  ------------------------------------------------------------------- Left atrium: The atrium was normal in size.  ------------------------------------------------------------------- Right ventricle: The cavity size was normal. Wall thickness was normal. Systolic function was normal.  ------------------------------------------------------------------- Pulmonic valve:  Structurally normal valve.  Cusp separation was normal.  Doppler: Transvalvular velocity was within the normal range. There was no evidence for stenosis. There was trivial regurgitation.  ------------------------------------------------------------------- Tricuspid valve:  Structurally normal valve.  Doppler: Transvalvular velocity was within the normal range. There was no regurgitation.  ------------------------------------------------------------------- Pulmonary artery:  The main pulmonary artery was normal-sized. Systolic pressure couldn&'t be assessed as there was no tricuspid regurgitation.  ------------------------------------------------------------------- Right atrium: The atrium was normal in size.  ------------------------------------------------------------------- Pericardium: A trivial pericardial effusion was identified posterior to the heart. Doppler: Features were not consistent with tamponade physiology.  ------------------------------------------------------------------- Systemic veins: Inferior vena cava: The vessel was normal in size.  ------------------------------------------------------------------- Measurements  Left ventricle              Value    Reference LV ID, ED, PLAX chordal         48.9 mm   43 - 52 LV ID, ES, PLAX chordal         33.8 mm   23 - 38 LV fx shortening, PLAX chordal      31  %   >=29 LV PW thickness, ED           14.5 mm   --------- IVS/LV PW ratio, ED      (H)    1.4     <=1.3 LV e&', lateral              8.66 cm/s  --------- LV E/e&', lateral             9.12     --------- LV e&', medial              6.25 cm/s  --------- LV E/e&', medial             12.64    --------- LV e&', average              7.46 cm/s  --------- LV E/e&', average             10.6     ---------  Ventricular septum             Value    Reference IVS thickness, ED            20.3 mm   ---------  Aorta                  Value    Reference  Aortic root ID, ED            32  mm   ---------  Left atrium               Value    Reference LA ID, A-P, ES              35  mm   --------- LA ID/bsa, A-P              1.49 cm/m^2 <=2.2 LA volume, S               54  ml   --------- LA volume/bsa, S             23  ml/m^2 --------- LA volume, ES, 1-p A4C          58  ml   --------- LA volume/bsa, ES, 1-p A4C        24.7 ml/m^2 --------- LA volume, ES, 1-p A2C          47  ml   --------- LA volume/bsa, ES, 1-p A2C        20  ml/m^2 ---------  Mitral valve               Value    Reference Mitral E-wave peak velocity       79  cm/s  --------- Mitral A-wave peak velocity       66.1 cm/s  --------- Mitral deceleration time         222  ms   150 - 230 Mitral peak gradient, D         2   mm Hg --------- Mitral E/A ratio, peak          1.2     ---------  Right ventricle             Value    Reference RV s&', lateral, S            11.1 cm/s  ---------  Legend: (L) and (H) mark values outside specified reference range.  ------------------------------------------------------------------- Prepared and Electronically Authenticated by  Tobias Alexander, M.D. 2016-01-12T16:37:33    Transesophageal Echocardiography  Patient:  Jadrian, Bulman MR #:    [id number] Study Date: 01/13/2015 Gender:   M Age:    34 Height: Weight: BSA: Pt. Status: Room:    5N30C  ADMITTING  Jorene Guest [id number] SONOGRAPHER Jeryl Columbia ORDERING   Dwana Melena REFERRING  Wilburt Finlay W  cc:  ------------------------------------------------------------------- LV EF: 60% -  65%  ------------------------------------------------------------------- Study Conclusions  - Left ventricle: The cavity size was normal. There was moderate concentric hypertrophy. Systolic function was normal. The estimated ejection fraction was in the range of 60% to 65%. Wall motion was normal; there were no regional wall motion abnormalities. - Aortic valve: No evidence of vegetation. 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 There is an associated 4x6 mm vegetation. There is severe mitral insufficiency through the perforation in the posterior leaflet with reversal of flow in both the left and right pulmonary veins. - Left atrium: The atrium was mildly dilated. No evidence of thrombus in the atrial cavity or appendage. No spontaneous echo contrast was observed. - Right atrium: No evidence of thrombus in the atrial cavity or appendage. No evidence of thrombus in the atrial cavity or appendage. - Atrial septum: No defect or patent  foramen ovale was identified. - Tricuspid valve: No evidence of vegetation. - Pulmonic valve: No evidence of vegetation. - Pericardium, extracardiac: A small pericardial effusion was identified circumferential to the heart. The fluid exhibited a fibrinous appearance.There was no evidence of hemodynamic compromise.  Diagnostic transesophageal echocardiography. 2D and color Doppler. Birthdate: Patient birthdate: Jan 05, 1980. Age: Patient is 35 yr old. Sex: Gender: male. Study date: Study date: 01/13/2015. Study time: 12:46 PM.  -------------------------------------------------------------------  ------------------------------------------------------------------- Left ventricle: The cavity size was  normal. There was moderate concentric hypertrophy. Systolic function was normal. The estimated ejection fraction was in the range of 60% to 65%. Wall motion was normal; there were no regional wall motion abnormalities.  ------------------------------------------------------------------- Aortic valve:  Structurally normal valve.  Cusp separation was normal. No evidence of vegetation. Doppler: There was no regurgitation.  ------------------------------------------------------------------- Aorta: The aorta was normal, not dilated, and non-diseased.  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 There is an associated 4x6 mm vegetation. There is severe mitral insufficiency through the perforation in the posterior leaflet with reversal of flow in both the left and right pulmonary veins.  ------------------------------------------------------------------- Left atrium: The atrium was mildly dilated. No evidence of thrombus in the atrial cavity or appendage. No spontaneous echo contrast was observed. Emptying velocity was normal.  ------------------------------------------------------------------- Atrial septum: No defect or patent foramen ovale was identified.  ------------------------------------------------------------------- Pulmonic valve:  Structurally normal valve.  Cusp separation was normal. No evidence of vegetation.  ------------------------------------------------------------------- Tricuspid valve:  Structurally normal valve.  Leaflet separation was normal. No evidence of vegetation. Doppler: There was no regurgitation.  ------------------------------------------------------------------- Right atrium: The atrium was normal in size. No evidence of thrombus in the atrial cavity or appendage. No evidence  of thrombus in the atrial cavity or appendage.  ------------------------------------------------------------------- Pericardium: A small pericardial effusion was identified circumferential to the heart. The fluid exhibited a fibrinous appearance.There was no evidence of hemodynamic compromise.  ------------------------------------------------------------------- Post procedure conclusions Ascending Aorta:  - The aorta was normal, not dilated, and non-diseased.  ------------------------------------------------------------------- Measurements  Mitral valve               Value Aliasing velocity, MR PISA        59.3  cm/s Mitral regurg PISA radius        9.4  mm Mitral maximal regurg velocity, PISA   693.22 cm/s Mitral regurg VTI, PISA         181.62 cm Mitral ERO, PISA             0.47  cm^2 Mitral regurg volume, PISA        86.3  ml  Legend: (L) and (H) mark values outside specified reference range.  ------------------------------------------------------------------- Prepared and Electronically Authenticated by  Thurmon Fair, MD 2016-01-14T14:19:28    Noninvasive Vascular Lab  Lower Extremity Arterial Evaluation  Patient:  Jonluke, Cobbins MR #:    [id number] Study Date: 01/11/2015 Gender:   M Age:    34 Height: Weight: BSA: Pt. Status: Room:    5N30C  ADMITTING  Ron Parker ORDERING   [place], [address]  Richarda Overlie [id number] Hendricks Limes SONOGRAPHER Maggie Font, RT-R, RDMS, RVT  Reports also to:  ------------------------------------------------------------------- History and indications:  Indications  707.14 Ulcer of heel and midfoot. History  Diagnostic evaluation.  ------------------------------------------------------------------- Study information:  Study status: Routine.  ABI.   Segmental  pressure measurements, ankle-brachial index, and photoplethysmography. Birthdate: Patient  birthdate: 1980-10-24. Age: Patient is 35 yr old. Sex: Gender: male. Study date: Study date: 01/11/2015. Study time: 03:49 PM. Location: Bedside. Patient status: Inpatient.  Brachial pressures:  +--------+-----+----+---+     RightLeftMax +--------+-----+----+---+ Systolic160 158 160 +--------+-----+----+---+  Arterial pressure indices:  +-----------------+---------+--------------+----------------------+ Location     Pressure Brachial indexWaveform        +-----------------+---------+--------------+----------------------+ Right ant tibial 184 mm Hg1.15     Normal         +-----------------+---------+--------------+----------------------+ Right post tibial165 mm Hg1.03     Normal         +-----------------+---------+--------------+----------------------+ Right 1st toe  109 mm Hg0.68     Biphasic        +-----------------+---------+--------------+----------------------+ Left ant tibial 82 mm Hg 0.51     Biphasic        +-----------------+---------+--------------+----------------------+ Left post tibial 102 mm Hg0.64     Normal         +-----------------+---------+--------------+----------------------+ Left 1st toe   -----------------------Severely dampened                         monophasic       +-----------------+---------+--------------+----------------------+  ------------------------------------------------------------------- Summary: Normal ABI on right with a moderate reduction on LT.  Right TBI: 0.68 LEft TBI unobtainable due to severely dampened waveform. Other specific details can be found in the table(s) above. 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  I have personally reviewed the patient's transthoracic and transesophageal echocardiograms. There was only mild mitral regurgitation noted on initial transthoracic echocardiogram performed on 1/11, but there is clearly severe mitral regurgitation noted on transesophageal echocardiogram performed yesterday.  The patient does not have large nor bulky vegetations.  The remainder of his mitral valve leaflets have findings suggestive of pre-existing degenerative disease due to mitral valve prolapse.  There are no signs of other complications such as the development of annular abscess formation.  There does not appear to be involvement of any of the other valves.  There is a small pericardial effusion, but nothing to suggest that this is causing any hemodynamic compromise at this time.  Clinically the patient does not have signs of congestive heart failure, and his murmur is not terribly impressive on physical exam.  There are no clear indications for surgery at this time, but given the severity of mitral regurgitation he will unquestionably need mitral valve repair or replacement at some point in the future. Given the presence of Staphylococcus aureus, he will need to be monitored closely because of the potential for a sudden change in his clinical status.  The patient's clinical condition is complicated by the presence of acute renal failure, which under the circumstances raises suspicion for the possibility of systemic embolization to one or both kidneys, particularly given the fact that the patient had only mild renal dysfunction with serum creatinine measured only 1.2 in March of 2015. The patient also has pain in his left thigh that has persisted for several days and is associated with diminished ankle brachial index on the left side, raising suspicion of the  possibility of septic embolization to the left lower extremity.    Plan:  I recommend continued inpatient care for close observation on telemetry, with particular care to monitor for the development of CHF, rhythm disturbances and/or signs of distal embolization.   I do not recommend pericardiocentesis or pericardial window at  this time.  I recommend vascular surgery consultation because of concerns regarding the possibility of septic embolization to the left lower extremity.  I recommend MRI head to r/o septic embolization to the brain.  At some point Mr [name] will need diagnostic cardiac catheterization performed, but as long as he remains clinically stable this can be deferred to minimize the risk of contrast nephropathy.  Similarly, CT angiogram of the abdomen/pelvis would be helpful to r/o possible septic embolization to the kidney(s), but it might not be worth the associated risk of contrast administration at this time.  Mr [name] needs Dental Service consultation.   I spent in excess of 120 minutes during the conduct of this hospital consultation and >50% of this time involved direct face-to-face encounter for counseling and/or coordination of the patient's care.  Salvatore Decentlarence H. Cornelius Moraswen, MD 01/14/2015 12:05 PM

## 2015-01-14 NOTE — Progress Notes (Addendum)
[name]   Patient seen and examined, chart and ECHO's reviewed.  Full consult note to follow.  I recommend continued inpatient care for close observation on telemetry, with particular care to monitor for the development of CHF, rhythm disturbances and/or signs of distal embolization.    I do not recommend pericardiocentesis or pericardial window at this time.  I recommend vascular surgery consultation because of concerns regarding the possibility of septic embolization to the left lower extremity.  I recommend MRI head to r/o septic embolization to the brain.  At some point Dwayne [name] will need diagnostic cardiac catheterization performed, but as long as he remains clinically stable this can be deferred to minimize the risk of contrast nephropathy.  Similarly, CT angiogram of the abdomen/pelvis would be helpful to r/o possible septic embolization to the kidney(s), but it might not be worth the associated risk of contrast administration at this time.  Dwayne [name] needs Dental Service consultation.  [name] 01/14/2015 12:06 PM

## 2015-01-14 NOTE — Consult Note (Signed)
[facility] [place] CONSULT NOTE   MRN : [id number]  Reason for Consult: Endocarditis staph bacteriemia with  Acute renal failure and left leg ABI 0.6. Work up for probable embolus.  History of Present Illness: 35 y/o male with history of uncontrolled HTN and DM.  He has diabetic ulcers to both feet and has not had home medication for months.  His home medications were lisinopril, insulin, and lasix.  He has a baseline Cr of 1.24.  We are asked to evaluate him for probable acute embolus to the kidney and left LE.      Current Facility-Administered Medications  Medication Dose Route Frequency Provider Last Rate Last Dose  . 0.9 % NaCl with KCl 40 mEq / L  infusion   Intravenous Continuous Richarda Overlie, MD 100 mL/hr at 01/14/15 0852 100 mL/hr at 01/14/15 0852  . acetaminophen (TYLENOL) tablet 650 mg  650 mg Oral Q6H PRN Ron Parker, MD   650 mg at 01/13/15 0423   Or  . acetaminophen (TYLENOL) suppository 650 mg  650 mg Rectal Q6H PRN Ron Parker, MD      . alum & mag hydroxide-simeth (MAALOX/MYLANTA) 200-200-20 MG/5ML suspension 30 mL  30 mL Oral Q6H PRN Ron Parker, MD      . ceFAZolin (ANCEF) IVPB 2 g/50 mL premix  2 g Intravenous 3 times per day Richarda Overlie, MD   2 g at 01/14/15 0531  . feeding supplement (GLUCERNA SHAKE) (GLUCERNA SHAKE) liquid 237 mL  237 mL Oral BID BM Stephanie La, RD   237 mL at 01/13/15 1513  . feeding supplement (PRO-STAT SUGAR FREE 64) liquid 30 mL  30 mL Oral Q1500 Marijean Niemann, RD   30 mL at 01/12/15 1829  . hydrALAZINE (APRESOLINE) injection 10 mg  10 mg Intravenous Q6H PRN Ron Parker, MD   10 mg at 01/10/15 0654  . HYDROmorphone (DILAUDID) injection 1 mg  1 mg Intravenous Q3H PRN Ripudeep Jenna Luo, MD   1 mg at 01/14/15 0755  . Influenza vac split quadrivalent PF (FLUARIX) injection 0.5 mL  0.5 mL Intramuscular Tomorrow-1000 Ripudeep K Rai, MD   0.5 mL at 01/10/15 1000  . insulin aspart (novoLOG) injection 0-15  Units  0-15 Units Subcutaneous TID WC Ripudeep Jenna Luo, MD   11 Units at 01/14/15 1009  . insulin aspart (novoLOG) injection 0-5 Units  0-5 Units Subcutaneous QHS Ripudeep Jenna Luo, MD   3 Units at 01/13/15 2159  . insulin aspart protamine- aspart (NOVOLOG MIX 70/30) injection 20 Units  20 Units Subcutaneous BID WC Ripudeep Jenna Luo, MD   20 Units at 01/14/15 0800  . living well with diabetes book MISC   Does not apply Once Richarda Overlie, MD      . metoprolol tartrate (LOPRESSOR) tablet 25 mg  25 mg Oral BID Ripudeep Jenna Luo, MD   25 mg at 01/14/15 0958  . neomycin-bacitracin-polymyxin (NEOSPORIN) ointment   Topical Daily Richarda Overlie, MD      . nicotine (NICODERM CQ - dosed in mg/24 hr) patch 7 mg  7 mg Transdermal Daily Ron Parker, MD   7 mg at 01/09/15 0855  . ondansetron (ZOFRAN) tablet 4 mg  4 mg Oral Q6H PRN Ron Parker, MD   4 mg at 01/10/15 0012   Or  . ondansetron (ZOFRAN) injection 4 mg  4 mg Intravenous Q6H PRN Ron Parker, MD   4 mg at 01/11/15 0721  . oxyCODONE (  Oxy IR/ROXICODONE) immediate release tablet 5 mg  5 mg Oral Q4H PRN Ripudeep Jenna LuoK Rai, MD   5 mg at 01/13/15 0029  . sodium chloride 0.9 % bolus 500 mL  500 mL Intravenous Once Richarda OverlieNayana Abrol, MD        Pt meds include: Statin :No Betablocker: No ASA: No Other anticoagulants/antiplatelets:   Past Medical History  Diagnosis Date  . Diabetes mellitus   . Hypertension   . Acute renal failure   . Bacteremia     MSSA   . Diabetes type 2, uncontrolled 01/09/2015  . Diabetic foot infection 01/15/2014  . 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 Essential hypertension, benign 01/18/2014  . Infective myositis of left thigh   . Noncompliance 01/18/2014  . Septic shock   . Smoker   . Staphylococcus aureus bacteremia with sepsis   . Tobacco abuse 01/15/2014  . Mitral regurgitation 01/13/2015    Severe by TEE    Past Surgical History  Procedure Laterality Date  . I&d extremity Bilateral 01/16/2014    Procedure:   DEBRIDEMENT of bilateral foot ulcers;  Surgeon: Kathryne Hitchhristopher Y Blackman, MD;  [facility];  Service: Orthopedics;  Laterality: Bilateral;  . Tee without cardioversion N/A 01/13/2015    Procedure: TRANSESOPHAGEAL ECHOCARDIOGRAM (TEE);  Surgeon: Thurmon FairMihai Croitoru, MD;  [facility];  Service: Cardiovascular;  Laterality: N/A;    Social History History  Substance Use Topics  . Smoking status: Current Every Day Smoker    Types: Cigars  . Smokeless tobacco: Never Used  . Alcohol Use: Yes    Family History DM, HTN, CAD  No Known Allergies   REVIEW OF SYSTEMS  General: [ ]  Weight loss, [ x] Fever, [x ] chills [x]  general weakness Neurologic: [ ]  Dizziness, [ ]  Blackouts, [ ]  Seizure [ ]  Stroke, [ ]  "Mini stroke", [ ]  Slurred speech, [ ]  Temporary blindness; [ ]  weakness in arms or legs, [ ]  Hoarseness [ ]  Dysphagia Cardiac: [ ]  Chest pain/pressure, [ ]  Shortness of breath at rest [ ]  Shortness of breath with exertion, [ ]  Atrial fibrillation or irregular heartbeat  Vascular: [ ]  Pain in legs with walking, [ ]  Pain in legs at rest, [ ]  Pain in legs at night,  [ ]  Non-healing ulcer, [ ]  Blood clot in vein/DVT,   Pulmonary: [ ]  Home oxygen, [ ]  Productive cough, [ ]  Coughing up blood, [ ]  Asthma,  [ ]  Wheezing [ ]  COPD Musculoskeletal:  [ ]  Arthritis, [ ]  Low back pain, [ ]  Joint pain Hematologic: [ ]  Easy Bruising, [ ]  Anemia; [ ]  Hepatitis Gastrointestinal: [ ]  Blood in stool, [ ]  Gastroesophageal Reflux/heartburn, Urinary: [ ]  chronic Kidney disease, [ ]  on HD - [ ]  MWF or [ ]  TTHS, [ ]  Burning with urination, [ ]  Difficulty urinating Skin: [ ]  Rashes, [ ]  Wounds Psychological: [ ]  Anxiety, [ ]  Depression  Physical Examination Filed Vitals:   01/13/15 1330 01/13/15 1357 01/13/15 2148 01/14/15 0643  BP: 168/99 150/88 154/87 153/86  Pulse: 92 92 103 96  Temp:  98.6 F (37 C) 99.7 F (37.6 C) 99.1 F (37.3 C)  TempSrc:   Oral Oral  Resp: 23 20 18 18   Height:       Weight:      SpO2: 100% 100% 100% 100%   Body mass index is 29.06 kg/(m^2).  General:  WDWN in NAD Gait: Normal HENT: WNL Eyes: Pupils equal Pulmonary: normal non-labored breathing , without  Rales, rhonchi,  wheezing Cardiac: RRR, without  Murmurs, rubs or gallops; No carotid bruits Abdomen: soft, NT, no masses Skin: no rashes, plantar fishers with serous drainage bilateral feet.ulcers noted;  no Gangrene , no cellulitis; no open wounds;   Vascular Exam/Pulses:Palpable radial arteries bil., palpable femoral pulses 3+ bil,  Doppler DP/PT biphasic bilaterally   Musculoskeletal: no muscle wasting or atrophy; positive LE edema Left > right Neurologic: A&O X 3; Appropriate Affect ;  SENSATION: normal; MOTOR FUNCTION: 4+/5 Symmetric Speech is fluent/normal   Significant Diagnostic Studies: CBC Lab Results  Component Value Date   WBC 10.8* 01/14/2015   HGB 9.9* 01/14/2015   HCT 29.1* 01/14/2015   MCV 83.1 01/14/2015   PLT 264 01/14/2015    BMET    Component Value Date/Time   NA 131* 01/14/2015 0518   K 3.0* 01/14/2015 0518   CL 102 01/14/2015 0518   CO2 23 01/14/2015 0518   GLUCOSE 241* 01/14/2015 0518   BUN 33* 01/14/2015 0518   CREATININE 2.66* 01/14/2015 0518   CREATININE 1.24 03/16/2014 1239   CALCIUM 7.7* 01/14/2015 0518   GFRNONAA 30* 01/14/2015 0518   GFRAA 34* 01/14/2015 0518   Estimated Creatinine Clearance: 50 mL/min (by C-G formula based on Cr of 2.66).  COAG Lab Results  Component Value Date   INR 1.22 01/14/2015     Non-Invasive Vascular Imaging:  ABI  Preliminary report:  RT ABI: 1.15 LT ABI: 0.64 RT TBI 0.68 LT TBI unobtainable  ASSESSMENT/PLAN:  PAD  Acute renal failure Uncontrolled DM and HTN Acute bacteremia with possible endocarditis  He does not have acute ischemic changes to his feet or legs.  He reports no rest pain, discoloration or loss of motor function in his extremities.  He states he has had diabetic wounds to the  bottom of his feet on/off for the past year.  In the setting of acute renal failure he wound not need an angiogram unless he had ischemic signs or symptoms in his feet or legs.  He may need one once he is stable and it may have to be performed with CO2.    Clinton Gallant [facility] 01/14/2015 12:12 PM  Please refer to my subsequent note.  Waverly Ferrari, MD, FACS Beeper [phone] 01/14/2015

## 2015-01-14 NOTE — Progress Notes (Addendum)
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 Problems:   Tobacco abuse   Noncompliance   Dehydration   Diarrhea   Diabetes type 2, uncontrolled   Hyperglycemia   Uncontrolled hypertension   Diabetic foot ulcers   Pain   Diabetic ulcer of left foot associated with type 2 diabetes mellitus   Diabetic ulcer of right foot associated with type 2 diabetes mellitus   Osteomyelitis   Shoulder pain   Thigh pain, musculoskeletal   Septic shock   Staphylococcus aureus bacteremia with sepsis   Smoker   Elevated troponin   Acute renal failure   Bacteremia   Septic joint of right shoulder region   Infective myositis of left thigh   Purulent pericarditis   Mitral regurgitation     staph aureus bacteremia with septic shock, renal failure, + troponins, with mx possible sources and or metastatic sites of infection including his RIGHT SHOULDER, bilateral FEET, left thigh Initially on broad-spectrum antibiotics vancomycin and Zosyn 1/12 Now switched to daptomycin plus Ancef?   infectious disease Dr. Drucilla Schmidt recommends 8 weeks of antibiotics post heart surgery and all other potential surgeries  influenza PCR negative  respiratory panel negative UA negative MRI both feet as per infectious disease recommendations No evidence of osteomyelitis on bilateral MRI of the feet MRI of the right shoulder, shows effusion with possible synovitis Appreciate orthopedic consultation, consult IR to aspirate the right shoulder MRI left thigh -Myositis of the proximal vastus lateralis, probably infectious. Right shoulder aspiration shows a white count of 56,000, according to infectious disease this is significant for septic joint, and patient needs a joint washout conveyed this to Dr. Ninfa Linden. The patient will need cardiology clearance prior to his joint  washout MRI of the right thigh is pending, that the patient needs MRI of the brain?      Bacteremia with possible endocarditis 2-D echo recommends A cardiac MRI should be considered for the evaluation of infiltrative cardiomyopathy. Infectious disease also concerned about staph aureus purulent pericarditis. 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 CV surgery recommends continued inpatient monitoring He does not recommend pericardiocentesis or pericardial window At some point the patient needs diagnostic cardiac cath    Diarrhea C. difficile negative   Poorly controlled diabetes Hemoglobin A1c 8.1 Patient admits to being noncompliant with medications Continue current insulin regimen Diabetes coordinator consult   Hypertension Continue Lopressor BID, hydralazine as needed  Acute renal failure-prerenal vs vancomycin toxicity vs septic emboli Likely prerenal with a baseline creatinine of 1.24, slight improvement today Appreciate nephrology input, nephrology consulted-Dr.webb Renal ultrasound does not show any hydronephrosis Continue gentle IV hydration    Hypokalemia replete Check magnesium level   Code Status: full Family Communication: family updated about patient's clinical progress Disposition Plan:  As above    Brief narrative: Dwayne [name] is a 35 y.o. male with a history of Uncontrolled HTN, DM2 who presents to the ED with complaints of diarrhea and fevers and chills x 24 hours. He reports having weakness and fatigue and He denies any nausea or vomiting. No one is sick at home, and he denies taking any recent antibiotic therapy. He reports that he has been out of his medications for months, and has not seen  his PCP in months as well. He does have Diabetic  Ulcers of both feet and reports that he is seen for wound care and was last seen 1 week ago.   Consultants:  None  Procedures:  None  Antibiotics: Flagyl discontinued < 1/11 Vancomycin and Zosyn > 1/11  HPI/Subjective: Patient tachycardic, hypertensive, febrile Diarrhea has improved, still complaining of nausea and vomiting Nausea improved , Denies significant right shoulder pain   Objective: Filed Vitals:   01/13/15 1330 01/13/15 1357 01/13/15 2148 01/14/15 0643  BP: 168/99 150/88 154/87 153/86  Pulse: 92 92 103 96  Temp:  98.6 F (37 C) 99.7 F (37.6 C) 99.1 F (37.3 C)  TempSrc:   Oral Oral  Resp: 23 20 18 18   Height:      Weight:      SpO2: 100% 100% 100% 100%    Intake/Output Summary (Last 24 hours) at 01/14/15 1323 Last data filed at 01/14/15 1300  Gross per 24 hour  Intake    650 ml  Output   2100 ml  Net  -1450 ml    Exam:  General: alert & oriented x 3 In NAD  Cardiovascular: RRR, nl S1 s2  Respiratory: Decreased breath sounds at the bases, scattered rhonchi, no crackles  Abdomen: soft +BS NT/ND, no masses palpable  Extremities: No cyanosis and no edema      Data Reviewed: Basic Metabolic Panel:  Recent Labs Lab 01/09/15 0500 01/10/15 0508 01/10/15 1121 01/11/15 0235 01/11/15 0953 01/12/15 0614 01/13/15 0503 01/14/15 0518  NA 136 132*  --  131*  --  129* 134* 131*  K 3.0* 3.0*  --  3.6  --  3.0* 3.1* 3.0*  CL 105 103  --  100  --  98 101 102  CO2 23 23  --  21  --  21 22 23   GLUCOSE 253* 103*  --  172*  --  97 123* 241*  BUN 17 21  --  29*  --  34* 37* 33*  CREATININE 1.86* 2.15*  --  2.52*  --  3.15* 3.05* 2.66*  CALCIUM 7.7* 8.0*  --  8.0*  --  7.5* 8.0* 7.7*  MG 1.5  --  1.6  --  1.9  --  2.3  --     Liver Function Tests:  Recent Labs Lab 01/09/15 0021 01/11/15 0235 01/12/15 0614 01/13/15 0503 01/14/15 0518  AST 25 20 25 23 23   ALT 19 23 23 21 14   ALKPHOS 86 80 81 151* 211*  BILITOT 0.6 0.6 0.6 0.2* 0.3  PROT  6.0 6.3 5.0* 5.7* 5.6*  ALBUMIN 2.1* 1.7* 1.4* 1.4* 1.3*   No results for input(s): LIPASE, AMYLASE in the last 168 hours. No results for input(s): AMMONIA in the last 168 hours.  CBC:  Recent Labs Lab 01/09/15 0021  01/10/15 0508 01/11/15 0235 01/12/15 0614 01/13/15 0503 01/14/15 0518  WBC 11.3*  < > 13.8* 15.3* 13.4* 13.4* 10.8*  NEUTROABS 9.3*  --   --   --   --   --   --   HGB 11.7*  < > 12.2* 12.1* 10.3* 10.6* 9.9*  HCT 34.7*  < > 35.1* 34.8* 30.8* 31.4* 29.1*  MCV 82.6  < > 81.6 81.3 82.1 82.4 83.1  PLT 179  < > 126* 153 169 243 264  < > = values in this interval not displayed.  Cardiac Enzymes:  Recent Labs Lab 01/10/15 2036 01/11/15 0235 01/12/15 0614 01/12/15 1130 01/12/15 1849  CKTOTAL  --   --  49  --   --   TROPONINI 0.33* 0.28* 0.84* 0.65* 0.47*   BNP (last 3 results) No results for input(s): PROBNP in the last 8760 hours.   CBG:  Recent Labs Lab 01/13/15 2153 01/14/15 0043 01/14/15 0436 01/14/15 0959 01/14/15 1148  GLUCAP 273* 222* 225* 302* 198*    Recent Results (from the past 240 hour(s))  Urine culture     Status: None   Collection Time: 01/09/15  1:31 AM  Result Value Ref Range Status   Specimen Description URINE, CLEAN CATCH  Final   Special Requests NONE  Final   Colony Count   Final    3,000 COLONIES/ML Performed at [facility]    Culture   Final    INSIGNIFICANT GROWTH Performed at [facility]    Report Status 01/10/2015 FINAL  Final  Stool culture     Status: None   Collection Time: 01/09/15  4:09 AM  Result Value Ref Range Status   Specimen Description VAGINAL/RECTAL  Final   Special Requests Normal  Final   Culture   Final    NO SALMONELLA, SHIGELLA, CAMPYLOBACTER, YERSINIA, OR E.COLI 0157:H7 ISOLATED Performed at [facility]    Report Status 01/12/2015 FINAL  Final  Clostridium Difficile by PCR     Status: None   Collection Time: 01/09/15  4:09 AM  Result Value Ref Range Status   C  difficile by pcr NEGATIVE NEGATIVE Final  Culture, blood (routine x 2)     Status: None   Collection Time: 01/10/15  4:35 AM  Result Value Ref Range Status   Specimen Description BLOOD RIGHT HAND  Final   Special Requests BOTTLES DRAWN AEROBIC AND ANAEROBIC 10CC EA  Final   Culture   Final    STAPHYLOCOCCUS AUREUS Note: SUSCEPTIBILITIES PERFORMED ON PREVIOUS CULTURE WITHIN THE LAST 5 DAYS. Note: Culture results may be compromised due to an excessive volume of blood received in culture bottles. Gram Stain Report Called to,Read Back By and Verified With:  MONICA YIM ON 01/10/2015 AT 11:36P BY WILEJ Performed at [facility]    Report Status 01/13/2015 FINAL  Final  Culture, blood (routine x 2)     Status: None   Collection Time: 01/10/15  4:46 AM  Result Value Ref Range Status   Specimen Description BLOOD LEFT ARM  Final   Special Requests BOTTLES DRAWN AEROBIC AND ANAEROBIC 10CC EA  Final   Culture   Final    STAPHYLOCOCCUS AUREUS Note: SUSCEPTIBILITIES PERFORMED ON PREVIOUS CULTURE WITHIN THE LAST 5 DAYS. Note: Culture results may be compromised due to an excessive volume of blood received in culture bottles. Gram Stain Report Called to,Read Back By and Verified With: MONICA YIM ON 01/10/2015 AT 11:36P BY WILEJ Performed at [facility]    Report Status 01/13/2015 FINAL  Final  Respiratory virus panel (routine influenza)     Status: None   Collection Time: 01/10/15 10:34 AM  Result Value Ref Range Status   Source - RVPAN NASAL SWAB  Corrected    Comment: CORRECTED ON 01/12 AT 2036: PREVIOUSLY REPORTED AS NASAL SWAB   Respiratory Syncytial Virus A NOT DETECTED  Final   Respiratory Syncytial Virus B NOT DETECTED  Final   Influenza A NOT DETECTED  Final   Influenza B NOT DETECTED  Final   Parainfluenza 1 NOT DETECTED  Final   Parainfluenza 2 NOT DETECTED  Final   Parainfluenza 3  NOT DETECTED  Final   Metapneumovirus NOT DETECTED  Final   Rhinovirus NOT DETECTED  Final    Adenovirus NOT DETECTED  Final   Influenza A H1 NOT DETECTED  Final   Influenza A H3 NOT DETECTED  Final    Comment: (NOTE)       Normal Reference Range for each Analyte: NOT DETECTED Testing performed using the Luminex xTAG Respiratory Viral Panel test kit. The analytical performance characteristics of this assay have been determined by [facility].  The modifications have not been cleared or approved by the FDA. This assay has been validated pursuant to the CLIA regulations and is used for clinical purposes. Performed at [facility], blood (routine x 2)     Status: None   Collection Time: 01/10/15 11:19 AM  Result Value Ref Range Status   Specimen Description BLOOD LEFT FOREARM  Final   Special Requests BOTTLES DRAWN AEROBIC ONLY 5CC  Final   Culture   Final    STAPHYLOCOCCUS AUREUS Note: RIFAMPIN AND GENTAMICIN SHOULD NOT BE USED AS SINGLE DRUGS FOR TREATMENT OF STAPH INFECTIONS. Note: Gram Stain Report Called to,Read Back By and Verified With: NURSE MONICA YIM 01/11/15 6:05AM THOMI Performed at [facility]    Report Status 01/13/2015 FINAL  Final   Organism ID, Bacteria STAPHYLOCOCCUS AUREUS  Final      Susceptibility   Staphylococcus aureus - MIC*    CLINDAMYCIN <=0.25 SENSITIVE Sensitive     ERYTHROMYCIN <=0.25 SENSITIVE Sensitive     GENTAMICIN <=0.5 SENSITIVE Sensitive     LEVOFLOXACIN 4 INTERMEDIATE Intermediate     OXACILLIN 0.5 SENSITIVE Sensitive     PENICILLIN 0.12 SENSITIVE Sensitive     RIFAMPIN 2 INTERMEDIATE Intermediate     TRIMETH/SULFA <=10 SENSITIVE Sensitive     VANCOMYCIN 1 SENSITIVE Sensitive     TETRACYCLINE <=1 SENSITIVE Sensitive     MOXIFLOXACIN 2 RESISTANT Resistant     * STAPHYLOCOCCUS AUREUS  Culture, blood (routine x 2)     Status: None   Collection Time: 01/10/15 11:27 AM  Result Value Ref Range Status   Specimen Description BLOOD LEFT ANTECUBITAL  Final   Special Requests BOTTLES DRAWN AEROBIC ONLY  [place]  Final   Culture   Final    STAPHYLOCOCCUS AUREUS Note: SUSCEPTIBILITIES PERFORMED ON PREVIOUS CULTURE WITHIN THE LAST 5 DAYS. Note: Gram Stain Report Called to,Read Back By and Verified With: NURSE MONICA YIM 01/11/15 6:05AM THOMI Performed at [facility]    Report Status 01/13/2015 FINAL  Final     Studies: Dg Chest 2 View  01/14/2015   CLINICAL DATA:  Right shoulder pain for 6 days.  EXAM: CHEST  2 VIEW  COMPARISON:  01/10/2015  FINDINGS: Two views of the chest demonstrate low lung volumes with bibasilar chest densities, left side greater than right. In addition, the heart appears to be enlarged. The configuration of the heart raises the possibility for pericardial fluid. No evidence for pulmonary edema. Difficult to exclude small effusions, particularly on the left side.  IMPRESSION: Low lung volumes with bibasilar densities, left side greater than right. Basilar densities could represent a combination of atelectasis and pleural fluid.  Heart appears enlarged which may be accentuated by low lung volumes. Pericardial fluid cannot be excluded.   Electronically Signed   By: Markus Daft M.D.   On: 01/14/2015 08:17   Dg Shoulder Right  01/09/2015   CLINICAL DATA:  Anterior right shoulder pain  EXAM:  RIGHT SHOULDER - 2+ VIEW  COMPARISON:  None.  FINDINGS: No fracture or dislocation is seen.  The joint spaces are preserved.  The visualized soft tissues are unremarkable.  Visualized right lung is clear.  IMPRESSION: No fracture or dislocation is seen.   Electronically Signed   By: Julian Hy M.D.   On: 01/09/2015 09:44   US Renal  01/12/2015   CLINICAL DATA:  Acute renal failure  EXAM: RENAL/URINARY TRACT ULTRASOUND COMPLETE  COMPARISON:  None.  FINDINGS: Right Kidney:  Length: 12.8 cm in length. Diffuse increased cortical echogenicity suspicious for medical renal disease. No hydronephrosis. No renal calculus.  Left Kidney:  Length: . 12.6 cm in length. Diffuse increased echogenicity  suspicious for medical renal disease. No hydronephrosis. No diagnostic renal calculus.  Bladder:  Appears normal for degree of bladder distention.  IMPRESSION: Bilateral kidneys shows diffuse increased echogenicity. Medical renal disease cannot be excluded. No hydronephrosis or diagnostic renal calculus.   Electronically Signed   By: Lahoma Crocker M.D.   On: 01/12/2015 16:08   Mr Shoulder Right Wo Contrast  01/11/2015   CLINICAL DATA:  Right shoulder pain.  EXAM: MRI OF THE RIGHT SHOULDER WITHOUT CONTRAST  TECHNIQUE: Multiplanar, multisequence MR imaging of the shoulder was performed. No intravenous contrast was administered.  COMPARISON:  Radiographs dated 01/09/2015  FINDINGS: Rotator cuff:  Normal.  Muscles:  Normal.  Biceps long head: Properly located and intact. There is inflammation in the soft tissues around the bicipital tendon sheath with fluid in the bicipital tendon sheath.  Acromioclavicular Joint: Small degenerative cyst in the acromion. Otherwise normal AC joint. Type 1 acromion. No bursitis.  Glenohumeral Joint: There is a moderate glenohumeral joint effusion with extravasation of contrast from the subcoracoid recess of the joint into the adjacent soft tissues. Fluid distends the bicipital tendon sheath.  Labrum:  Intact.  Bones: No significant abnormalities. Tiny degenerative cystic areas in the posterior aspect of the greater tuberosity of the proximal humerus.  IMPRESSION: 1. Moderate glenohumeral joint effusion with some extravasation of joint. Fluid distends the bicipital tendon sheath with inflammation/extravasated joint fluid around the bicipital tendon sheath. The possibly of synovitis should be considered. 2. Normal rotator cuff.   Electronically Signed   By: Rozetta Nunnery M.D.   On: 01/11/2015 11:21   Mr Foot Right Wo Contrast  01/11/2015   CLINICAL DATA:  Diabetic foot ulcers.  EXAM: MRI OF THE RIGHT FOREFOOT WITHOUT CONTRAST  TECHNIQUE: Multiplanar, multisequence MR imaging was  performed. No intravenous contrast was administered.  COMPARISON:  Radiographs dated 01/09/2011  FINDINGS: There are small erosions of the proximal phalanx at the IP joint of the great toe and there are small erosions at the first metatarsal phalangeal joint, both consistent with gout. There are no soft tissue abscesses. There is no osteomyelitis. There is nonspecific edema in the subcutaneous fat of the dorsum of the foot. Muscles and tendons and ligaments appear appear normal. No joint effusions.  IMPRESSION: Subcutaneous edema primarily on the dorsum of the foot. 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   Electronically Signed   By: Rozetta Nunnery M.D.   On: 01/11/2015 12:50   Mr Femur Left Wo Contrast  01/12/2015   CLINICAL DATA:  Diabetes. Peripheral neuropathy. Sepsis. Bilateral thigh pain. RIGHT thigh pain. Tenderness in the medial aspect of the thigh.  EXAM: MR OF THE LEFT FEMUR WITHOUT CONTRAST  TECHNIQUE: Multiplanar, multisequence MR imaging was performed. No intravenous contrast was administered.  COMPARISON:  None.  FINDINGS: Diffuse subcutaneous edema is present around the pelvis and in both thighs, most compatible with anasarca. This could also represent cellulitis in the appropriate clinical setting. Subcutaneous fluid is present in the lateral LEFT thigh, without a discrete abscess. There is myositis of the LEFT vastus lateralis with mild muscular edema av but no significant swelling. No fatty atrophy.  Bone marrow signal shows heterogenous marrow. This is a nonspecific finding most commonly associated with obesity, anemia, cigarette smoking or chronic disease. Negative for hip fracture or AVN. There is no evidence of osteomyelitis. No deep soft tissue abscesses are present.  Bilaterally in the groin, there are prominent lymph nodes which are probably reactive to lower extremity process.  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   IMPRESSION: 1. Diffuse subcutaneous edema in the pelvis and thighs bilaterally, suggesting anasarca. 2. More prominent subcutaneous edema along the LEFT thigh with subcutaneous fluid superficial to the muscular fascia, likely representing cellulitis. No abscess. 3. Myositis of the proximal vastus lateralis, probably infectious. Reactive edema could produce this appearance as well. 4. Reactive inguinal adenopathy bilaterally. 5. Negative for osteomyelitis.   Electronically Signed   By: Dereck Ligas M.D.   On: 01/12/2015 14:33   Mr Foot Left Wo Contrast  01/11/2015   CLINICAL DATA:  Diabetic foot ulcers.  Foot pain.  Bony erosions.  EXAM: MRI OF THE LEFT FOREFOOT WITHOUT CONTRAST  TECHNIQUE: Multiplanar, multisequence MR imaging was performed. No intravenous contrast was administered.  COMPARISON:  Radiograph dated 01/09/2015  FINDINGS: There is slight edema throughout the medial cuneiform. There is artifactual increased signal from the metatarsal heads in from the proximal phalanx of the great toe. There are erosions at IP joint and first metatarsal phalangeal joint consistent with gout.  There are no soft tissue abscesses and there is no osteomyelitis. There is prominent edema in the subcutaneous fat of the dorsum of the foot.  IMPRESSION: No evidence of osteomyelitis or abscess. The edema in the medial cuneiform is most likely a stress reaction. 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   Electronically Signed   By: Rozetta Nunnery M.D.   On: 01/11/2015 12:15   Dg Chest Port 1 View  01/10/2015   CLINICAL DATA:  Fever and sweats.  EXAM: PORTABLE CHEST - 1 VIEW  COMPARISON:  None.  FINDINGS: Low lung volumes with vascular crowding and streaky subsegmental atelectasis. No obvious infiltrate or effusion. The heart is within normal limits in size given the AP portable technique. The bony structures are grossly normal.  IMPRESSION: Low lung volumes with vascular crowding and  bibasilar atelectasis.   Electronically Signed   By: Kalman Jewels M.D.   On: 01/10/2015 11:27   Dg Foot Complete Left  01/09/2015   CLINICAL DATA:  35 year old male with recurrent bilateral foot pain.  EXAM: LEFT FOOT - COMPLETE 3+ VIEW  COMPARISON:  01/05/2014.  FINDINGS: Large periarticular erosions are noted adjacent to the first MTP joint, and at the first interphalangeal joint along the medial margin in both locations. These have erosions have sclerotic margins and overhanging edges, and there is adjacent soft tissue prominence, suggestive of gouty erosions. No adjacent soft tissue calcification is noted. No acute displaced fracture, subluxation or dislocation. Small heterotopic ossification adjacent to the plantar aspect of the calcaneus, likely ossification within the plantar fascia.  IMPRESSION: 1. Findings, as above, suggestive of underlying gout. Clinical correlation is recommended.  Electronically Signed   By: Vinnie Langton M.D.   On: 01/09/2015 09:46   Dg Foot Complete Right  01/09/2015   CLINICAL DATA:  Recurring bilateral foot pain  EXAM: RIGHT FOOT COMPLETE - 3+ VIEW  COMPARISON:  None.  FINDINGS: No fracture or dislocation is seen.  Marginal erosion at the medial aspect of the 1st IP joint, involving the proximal phalanx.  The visualized soft tissues are unremarkable.  IMPRESSION: Marginal erosion at the medial aspect of the 1st IP joint. Correlate for inflammatory arthropathy such as gout.  Otherwise, no acute osseus abnormality is seen.   Electronically Signed   By: Julian Hy M.D.   On: 01/09/2015 09:44   Dg Fluoro Guide Ndl Plc/bx  01/14/2015   CLINICAL DATA:  Right shoulder pain and effusion.  Bacteremia.  EXAM: RIGHT SHOULDER ASPIRATION UNDER FLUOROSCOPY  FLUOROSCOPY TIME:  0 min 24 seconds  PROCEDURE: Overlying skin prepped with Betadine, draped in the usual sterile fashion, and infiltrated locally with buffered Lidocaine. Twenty gauge spinal needle was placed in the  right glenohumeral joint under direct fluoroscopic visualization.  2 cc of bloody cloudy fluid was aspirated from the joint. Mr. Czaplicki tolerated the procedure well.  IMPRESSION: Technically successful right shoulder aspiration under fluoroscopy. Fluid sent for laboratory analysis.   Electronically Signed   By: Rozetta Nunnery M.D.   On: 01/14/2015 09:30    Scheduled Meds: .  ceFAZolin (ANCEF) IV  2 g Intravenous 3 times per day  . chlorhexidine  15 mL Mouth/Throat BID  . feeding supplement (GLUCERNA SHAKE)  237 mL Oral BID BM  . feeding supplement (PRO-STAT SUGAR FREE 64)  30 mL Oral Q1500  . Influenza vac split quadrivalent PF  0.5 mL Intramuscular Tomorrow-1000  . insulin aspart  0-15 Units Subcutaneous TID WC  . insulin aspart  0-5 Units Subcutaneous QHS  . insulin aspart protamine- aspart  20 Units Subcutaneous BID WC  . living well with diabetes book   Does not apply Once  . metoprolol tartrate  25 mg Oral BID  . neomycin-bacitracin-polymyxin   Topical Daily  . nicotine  7 mg Transdermal Daily  . sodium chloride  500 mL Intravenous Once   Continuous Infusions: . 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 Problems:   Tobacco abuse   Noncompliance   Dehydration   Diarrhea   Diabetes type 2, uncontrolled   Hyperglycemia   Uncontrolled hypertension   Diabetic foot ulcers   Pain   Diabetic ulcer of left foot associated with type 2 diabetes mellitus   Diabetic ulcer of right foot associated with type 2 diabetes mellitus   Osteomyelitis   Shoulder pain   Thigh pain, musculoskeletal   Septic shock   Staphylococcus aureus bacteremia with sepsis   Smoker   Elevated troponin   Acute renal failure   Bacteremia   Septic joint of right shoulder region   Infective myositis of left thigh   Purulent pericarditis   Mitral regurgitation    Time spent: 40 minutes   [facility] Pager [phone]. If 7PM-7AM, please contact night-coverage at [email or web address], password [email or web address] 01/14/2015, 1:23 PM  LOS: 5 days

## 2015-01-14 NOTE — Progress Notes (Addendum)
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 Problems:   Tobacco abuse   Noncompliance   Dehydration   Diarrhea   Diabetes type 2, uncontrolled   Hyperglycemia   Uncontrolled hypertension   Diabetic foot ulcers   Pain   Diabetic ulcer of left foot associated with type 2 diabetes mellitus   Diabetic ulcer of right foot associated with type 2 diabetes mellitus   Osteomyelitis   Shoulder pain   Thigh pain, musculoskeletal   Septic shock   Staphylococcus aureus bacteremia with sepsis   Smoker   Elevated troponin   Acute renal failure   Bacteremia   Septic joint of right shoulder region   Infective myositis of left thigh   Purulent pericarditis   Mitral regurgitation    SUBJECTIVE  No CP or SOB. LLE pain started last Thur, no prior h/o claudication symptom.   CURRENT MEDS .  ceFAZolin (ANCEF) IV  2 g Intravenous 3 times per day  . feeding supplement (GLUCERNA SHAKE)  237 mL Oral BID BM  . feeding supplement (PRO-STAT SUGAR FREE 64)  30 mL Oral Q1500  . Influenza vac split quadrivalent PF  0.5 mL Intramuscular Tomorrow-1000  . insulin aspart  0-15 Units Subcutaneous TID WC  . insulin aspart  0-5 Units Subcutaneous QHS  . insulin aspart protamine- aspart  20 Units Subcutaneous BID WC  . living well with diabetes book   Does not apply Once  . metoprolol tartrate  25 mg Oral BID  . neomycin-bacitracin-polymyxin   Topical Daily  . nicotine  7 mg Transdermal Daily  . sodium chloride  500 mL Intravenous Once    OBJECTIVE  Filed Vitals:   01/13/15 1330 01/13/15 1357 01/13/15 2148 01/14/15 0643  BP: 168/99 150/88 154/87 153/86  Pulse: 92 92 103 96  Temp:  98.6 F (37 C) 99.7 F (37.6 C) 99.1 F (37.3 C)  TempSrc:   Oral Oral  Resp: Height:      Weight:      SpO2: 100% 100% 100% 100%    Intake/Output Summary (Last 24 hours) at 01/14/15 1101 Last data filed at  01/14/15 0643  Gross per 24 hour  Intake    650 ml  Output   1350 ml  Net   -700 ml   Filed Weights   01/09/15 0017 01/09/15 0329  Weight: 220 lb (99.791 kg) 226 lb 6.4 oz (102.694 kg)    PHYSICAL EXAM  General: Pleasant, NAD. Neuro: Alert and oriented X 3. Moves all extremities spontaneously. Psych: Normal affect. HEENT:  Normal  Neck: Supple without bruits or JVD. Lungs:  Resp regular and unlabored, anterior exam CTA. Heart: RRR no s3, s4, or murmurs. Abdomen: Soft, non-tender, non-distended, BS + x 4.  Extremities: No clubbing, cyanosis or edema. LLE tender  Accessory Clinical Findings  CBC  Recent Labs  01/13/15 0503 01/14/15 0518  WBC 13.4* 10.8*  HGB 10.6* 9.9*  HCT 31.4* 29.1*  MCV 82.4 83.1  PLT 243 264   Basic Metabolic Panel  Recent Labs  01/13/15 0503 01/14/15 0518  NA 134* 131*  K 3.1* 3.0*  CL 101 102  CO2 22 23  GLUCOSE 123* 241*  BUN 37* 33*  CREATININE 3.05* 2.66*  CALCIUM 8.0* 7.7*  MG 2.3  --    Liver Function Tests  Recent Labs  01/13/15 0503 01/14/15 0518  AST 23 23  ALT 21 14  ALKPHOS 151* 211*  BILITOT 0.2* 0.3  PROT 5.7* 5.6*  ALBUMIN 1.4* 1.3*   Cardiac Enzymes  Recent Labs  01/12/15 0614 01/12/15 1130 01/12/15 1849  CKTOTAL 49  --   --   TROPONINI 0.84* 0.65* 0.47*   Hemoglobin A1C  Recent Labs  01/12/15 0614  HGBA1C 8.3*    TELE NSR with HR 80-90s    ECG  No new EKG  TEE 01/13/2015  LV EF: 60% -  65%  ------------------------------------------------------------------- Study Conclusions  - Left ventricle: The cavity size was normal. There was moderate concentric hypertrophy. Systolic function was normal. The estimated ejection fraction was in the range of 60% to 65%. Wall motion was normal; there were no regional wall motion abnormalities. - Aortic valve: No evidence of vegetation. 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 There is an associated 4x6 mm vegetation. There is severe mitral insufficiency through the perforation in the posterior leaflet with reversal of flow in both the left and right pulmonary veins. - Left atrium: The atrium was mildly dilated. No evidence of thrombus in the atrial cavity or appendage. No spontaneous echo contrast was observed. - Right atrium: No evidence of thrombus in the atrial cavity or appendage. No evidence of thrombus in the atrial cavity or appendage. - Atrial septum: No defect or patent foramen ovale was identified. - Tricuspid valve: No evidence of vegetation. - Pulmonic valve: No evidence of vegetation. - Pericardium, extracardiac: A small pericardial effusion was identified circumferential to the heart. The fluid exhibited a fibrinous appearance.There was no evidence of hemodynamic compromise.     Radiology/Studies  Dg Chest 2 View  01/14/2015   CLINICAL DATA:  Right shoulder pain for 6 days.  EXAM: CHEST  2 VIEW  COMPARISON:  01/10/2015  FINDINGS: Two views of the chest demonstrate low lung volumes with bibasilar chest densities, left side greater than right. In addition, the heart appears to be enlarged. The configuration of the heart raises the possibility for pericardial fluid. No evidence for pulmonary edema. Difficult to exclude small effusions, particularly on the left side.  IMPRESSION: Low lung volumes with bibasilar densities, left side greater than right. Basilar densities could represent a combination of atelectasis and pleural fluid.  Heart appears enlarged which may be accentuated by low lung volumes. Pericardial fluid cannot be excluded.   Electronically Signed   By: Richarda Overlie M.D.   On: 01/14/2015 08:17   Dg Shoulder Right  01/09/2015   CLINICAL DATA:  Anterior right shoulder pain  EXAM: RIGHT SHOULDER - 2+ VIEW  COMPARISON:  None.  FINDINGS: No fracture or dislocation is seen.  The joint spaces  are preserved.  The visualized soft tissues are unremarkable.  Visualized right lung is clear.  IMPRESSION: No fracture or dislocation is seen.   Electronically Signed   By: Charline Bills M.D.   On: 01/09/2015 09:44   US Renal  01/12/2015   CLINICAL DATA:  Acute renal failure  EXAM: RENAL/URINARY TRACT ULTRASOUND COMPLETE  COMPARISON:  None.  FINDINGS: Right Kidney:  Length: 12.8 cm in length. Diffuse increased cortical echogenicity suspicious for medical renal disease. No hydronephrosis. No renal calculus.  Left Kidney:  Length: . 12.6 cm in length. Diffuse increased echogenicity suspicious for medical renal disease. No hydronephrosis. No diagnostic renal calculus.  Bladder:  Appears normal for degree of bladder distention.  IMPRESSION: Bilateral kidneys shows diffuse  increased echogenicity. Medical renal disease cannot be excluded. No hydronephrosis or diagnostic renal calculus.   Electronically Signed   By: Natasha Mead M.D.   On: 01/12/2015 16:08   Mr Shoulder Right Wo Contrast  01/11/2015   CLINICAL DATA:  Right shoulder pain.  EXAM: MRI OF THE RIGHT SHOULDER WITHOUT CONTRAST  TECHNIQUE: Multiplanar, multisequence MR imaging of the shoulder was performed. No intravenous contrast was administered.  COMPARISON:  Radiographs dated 01/09/2015  FINDINGS: Rotator cuff:  Normal.  Muscles:  Normal.  Biceps long head: Properly located and intact. There is inflammation in the soft tissues around the bicipital tendon sheath with fluid in the bicipital tendon sheath.  Acromioclavicular Joint: Small degenerative cyst in the acromion. Otherwise normal AC joint. Type 1 acromion. No bursitis.  Glenohumeral Joint: There is a moderate glenohumeral joint effusion with extravasation of contrast from the subcoracoid recess of the joint into the adjacent soft tissues. Fluid distends the bicipital tendon sheath.  Labrum:  Intact.  Bones: No significant abnormalities. Tiny degenerative cystic areas in the posterior aspect of  the greater tuberosity of the proximal humerus.  IMPRESSION: 1. Moderate glenohumeral joint effusion with some extravasation of joint. Fluid distends the bicipital tendon sheath with inflammation/extravasated joint fluid around the bicipital tendon sheath. The possibly of synovitis should be considered. 2. Normal rotator cuff.   Electronically Signed   By: Geanie Cooley M.D.   On: 01/11/2015 11:21   Mr Foot Right Wo Contrast  01/11/2015   CLINICAL DATA:  Diabetic foot ulcers.  EXAM: MRI OF THE RIGHT FOREFOOT WITHOUT CONTRAST  TECHNIQUE: Multiplanar, multisequence MR imaging was performed. No intravenous contrast was administered.  COMPARISON:  Radiographs dated 01/09/2011  FINDINGS: There are small erosions of the proximal phalanx at the IP joint of the great toe and there are small erosions at the first metatarsal phalangeal joint, both consistent with gout. There are no soft tissue abscesses. There is no osteomyelitis. There is nonspecific edema in the subcutaneous fat of the dorsum of the foot. Muscles and tendons and ligaments appear appear normal. No joint effusions.  IMPRESSION: Subcutaneous edema primarily on the dorsum of the foot. 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   Electronically Signed   By: Geanie Cooley M.D.   On: 01/11/2015 12:50   Mr Femur Left Wo Contrast  01/12/2015   CLINICAL DATA:  Diabetes. Peripheral neuropathy. Sepsis. Bilateral thigh pain. RIGHT thigh pain. Tenderness in the medial aspect of the thigh.  EXAM: MR OF THE LEFT FEMUR WITHOUT CONTRAST  TECHNIQUE: Multiplanar, multisequence MR imaging was performed. No intravenous contrast was administered.  COMPARISON:  None.  FINDINGS: Diffuse subcutaneous edema is present around the pelvis and in both thighs, most compatible with anasarca. This could also represent cellulitis in the appropriate clinical setting. Subcutaneous fluid is present in the lateral LEFT thigh, without a discrete abscess. There is myositis of the LEFT vastus lateralis  with mild muscular edema av but no significant swelling. No fatty atrophy.  Bone marrow signal shows heterogenous marrow. This is a nonspecific finding most commonly associated with obesity, anemia, cigarette smoking or chronic disease. Negative for hip fracture or AVN. There is no evidence of osteomyelitis. No deep soft tissue abscesses are present.  Bilaterally in the groin, there are prominent lymph nodes which are probably reactive to lower extremity process.  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  IMPRESSION: 1. Diffuse subcutaneous edema in the pelvis and thighs  bilaterally, suggesting anasarca. 2. More prominent subcutaneous edema along the LEFT thigh with subcutaneous fluid superficial to the muscular fascia, likely representing cellulitis. No abscess. 3. Myositis of the proximal vastus lateralis, probably infectious. Reactive edema could produce this appearance as well. 4. Reactive inguinal adenopathy bilaterally. 5. Negative for osteomyelitis.   Electronically Signed   By: Andreas NewportGeoffrey  Lamke M.D.   On: 01/12/2015 14:33   Mr Foot Left Wo Contrast  01/11/2015   CLINICAL DATA:  Diabetic foot ulcers.  Foot pain.  Bony erosions.  EXAM: MRI OF THE LEFT FOREFOOT WITHOUT CONTRAST  TECHNIQUE: Multiplanar, multisequence MR imaging was performed. No intravenous contrast was administered.  COMPARISON:  Radiograph dated 01/09/2015  FINDINGS: There is slight edema throughout the medial cuneiform. There is artifactual increased signal from the metatarsal heads in from the proximal phalanx of the great toe. There are erosions at IP joint and first metatarsal phalangeal joint consistent with gout.  There are no soft tissue abscesses and there is no osteomyelitis. There is prominent edema in the subcutaneous fat of the dorsum of the foot.  IMPRESSION: No evidence of osteomyelitis or abscess. The edema in the medial cuneiform is most likely a  stress reaction. 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   Electronically Signed   By: Geanie CooleyJim  Maxwell M.D.   On: 01/11/2015 12:15   Dg Chest Port 1 View  01/10/2015   CLINICAL DATA:  Fever and sweats.  EXAM: PORTABLE CHEST - 1 VIEW  COMPARISON:  None.  FINDINGS: Low lung volumes with vascular crowding and streaky subsegmental atelectasis. No obvious infiltrate or effusion. The heart is within normal limits in size given the AP portable technique. The bony structures are grossly normal.  IMPRESSION: Low lung volumes with vascular crowding and bibasilar atelectasis.   Electronically Signed   By: Loralie ChampagneMark  Gallerani M.D.   On: 01/10/2015 11:27   Dg Foot Complete Left  01/09/2015   CLINICAL DATA:  35 year old male with recurrent bilateral foot pain.  EXAM: LEFT FOOT - COMPLETE 3+ VIEW  COMPARISON:  01/05/2014.  FINDINGS: Large periarticular erosions are noted adjacent to the first MTP joint, and at the first interphalangeal joint along the medial margin in both locations. These have erosions have sclerotic margins and overhanging edges, and there is adjacent soft tissue prominence, suggestive of gouty erosions. No adjacent soft tissue calcification is noted. No acute displaced fracture, subluxation or dislocation. Small heterotopic ossification adjacent to the plantar aspect of the calcaneus, likely ossification within the plantar fascia.  IMPRESSION: 1. Findings, as above, suggestive of underlying gout. Clinical correlation is recommended.   Electronically Signed   By: Trudie Reedaniel  Entrikin M.D.   On: 01/09/2015 09:46   Dg Foot Complete Right  01/09/2015   CLINICAL DATA:  Recurring bilateral foot pain  EXAM: RIGHT FOOT COMPLETE - 3+ VIEW  COMPARISON:  None.  FINDINGS: No fracture or dislocation is seen.  Marginal erosion at the medial aspect of the 1st IP joint, involving the proximal phalanx.  The visualized soft tissues are unremarkable.  IMPRESSION: Marginal erosion at the medial  aspect of the 1st IP joint. Correlate for inflammatory arthropathy such as gout.  Otherwise, no acute osseus abnormality is seen.   Electronically Signed   By: Charline BillsSriyesh  Krishnan M.D.   On: 01/09/2015 09:44   Dg Fluoro Guide Ndl Plc/bx  01/14/2015   CLINICAL DATA:  Right shoulder pain and effusion.  Bacteremia.  EXAM: RIGHT SHOULDER ASPIRATION UNDER FLUOROSCOPY  FLUOROSCOPY TIME:  0 min 24 seconds  PROCEDURE: Overlying skin prepped with Betadine, draped in the usual sterile fashion, and infiltrated locally with buffered Lidocaine. Twenty gauge spinal needle was placed in the right glenohumeral joint under direct fluoroscopic visualization.  2 cc of bloody cloudy fluid was aspirated from the joint. Mr. Gastelum tolerated the procedure well.  IMPRESSION: Technically successful right shoulder aspiration under fluoroscopy. Fluid sent for laboratory analysis.   Electronically Signed   By: Geanie Cooley M.D.   On: 01/14/2015 09:30    ASSESSMENT AND PLAN  35 y.o. male with a PMHx of uncontrolled hypertension, type 2 diabetes mellitus, who was admitted to [facility] on 01/09/2015 for evaluation of diarrhea, fevers, chills for the past 24 hours. A troponin was checked and found to be elevated. Blood culture shows staph aureus bacteremia. 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 Also has pericardial stranding. Pending CT surgery consult for MV surgery after sterilization  1. Elevated trop: in the setting of endocarditis  2. 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 Barry Dienes with CT surgery, discussed with Dr. Barry Dienes regarding this case, currently does not need urgent surgery, would prefer the bacteremia to clear up first, however he is surprised the degree his MR worsened in 2 days between TTE and TEE. May need  earlier surgery his start to have HF symptom  - per Dr. Cornelius Moras, ideally need cardiac cath, LE angiography to r/o septic emboli to LLE arterial system, however understandably his renal function is prohibitive of such test now. He may also need dental eval prior to surgery. Dr. Cornelius Moras concern recent AKI may be related to septic emboli as well, nephrology on board.   3. staph bacteremia, see#2 4. PVD, LE ABI 0.64. See #1 5. Acute on chronic renal insufficiency: improving 6. Hypoalbuminemia 7. Hypokalemia 8. R shoulder pain w/ effusion seen on MRI: s/p R shoulder aspiration: labs pending  - maybe source of his bacteremia  Signed, Azalee Course PA-C Pager: [phone]  Personally seen and examined. Agree with above. Discussed with Dr. Cornelius Moras I spoke with Dr. Edilia Bo with vascular for consult. AKI mildly improved (creat 3 -->2.6)  Will need to watch closely. No signs of heart failure as of now.  Concerned.   He is of moderate cardiac risk (based on severe MR and underlying endocarditis) for shoulder debridement, and may proceed. Careful with fluid administration. No current signs of CHF. EF normal.   Donato Schultz, MD

## 2015-01-14 NOTE — Progress Notes (Signed)
[facility]    Subjective: He denied to me that he been denying or refusing blood draws though as I've seen he has not had any repeat blood cultures since the 11th. I ever size and we need repeat blood cultures to ensure that his bacteremia is clearing. He had just had his aspirate from his shoulder performed   Antibiotics:  Anti-infectives    Start     Dose/Rate Route Frequency Ordered Stop   01/14/15 1500  DAPTOmycin (CUBICIN) 600 mg in sodium chloride 0.9 % IVPB  Status:  Discontinued     600 mg224 mL/hr over 30 Minutes Intravenous Every 48 hours 01/12/15 1421 01/13/15 1239   01/13/15 1500  ceFAZolin (ANCEF) IVPB 2 g/50 mL premix     2 g100 mL/hr over 30 Minutes Intravenous 3 times per day 01/13/15 1407     01/12/15 1600  ceFAZolin (ANCEF) IVPB 2 g/50 mL premix  Status:  Discontinued     2 g100 mL/hr over 30 Minutes Intravenous Every 12 hours 01/12/15 1419 01/13/15 1407   01/11/15 1200  DAPTOmycin (CUBICIN) 600 mg in sodium chloride 0.9 % IVPB  Status:  Discontinued     600 mg224 mL/hr over 30 Minutes Intravenous Every 24 hours 01/11/15 1003 01/12/15 1421   01/10/15 1200  vancomycin (VANCOCIN) IVPB 1000 mg/200 mL premix  Status:  Discontinued     1,000 mg200 mL/hr over 60 Minutes Intravenous Every 8 hours 01/10/15 1044 01/11/15 0947   01/10/15 1100  piperacillin-tazobactam (ZOSYN) IVPB 3.375 g  Status:  Discontinued     3.375 g12.5 mL/hr over 240 Minutes Intravenous Every 8 hours 01/10/15 1043 01/11/15 0902   01/09/15 0700  metroNIDAZOLE (FLAGYL) IVPB 500 mg  Status:  Discontinued     500 mg100 mL/hr over 60 Minutes Intravenous Every 8 hours 01/09/15 0651 01/10/15 1018      Medications: Scheduled Meds: .  ceFAZolin (ANCEF) IV  2 g Intravenous 3 times per day  . chlorhexidine  15 mL Mouth/Throat BID  . feeding supplement (GLUCERNA SHAKE)  237 mL Oral BID BM  . feeding supplement (PRO-STAT SUGAR FREE 64)  30 mL Oral Q1500  . Influenza vac split  quadrivalent PF  0.5 mL Intramuscular Tomorrow-1000  . insulin aspart  0-15 Units Subcutaneous TID WC  . insulin aspart  0-5 Units Subcutaneous QHS  . insulin aspart protamine- aspart  20 Units Subcutaneous BID WC  . living well with diabetes book   Does not apply Once  . metoprolol tartrate  25 mg Oral BID  . neomycin-bacitracin-polymyxin   Topical Daily  . nicotine  7 mg Transdermal Daily  . sodium chloride  500 mL Intravenous Once   Continuous Infusions: . 0.9 % NaCl with KCl 40 mEq / L 100 mL/hr (01/14/15 0852)   PRN Meds:.acetaminophen **OR** acetaminophen, alum & mag hydroxide-simeth, hydrALAZINE, HYDROmorphone (DILAUDID) injection, ondansetron **OR** ondansetron (ZOFRAN) IV, oxyCODONE    Objective: Weight change:   Intake/Output Summary (Last 24 hours) at 01/14/15 1445 Last data filed at 01/14/15 1300  Gross per 24 hour  Intake    550 ml  Output   2100 ml  Net  -1550 ml   Blood pressure 153/86, pulse 96, temperature 99.1 F (37.3 C), temperature source Oral, resp. rate 18, height 6' 2"  (1.88 m), weight 226 lb 6.4 oz (102.694 kg), SpO2 100 %. Temp:  [99.1 F (37.3 C)-99.7 F (37.6 C)] 99.1 F (37.3 C) (01/15 0643) Pulse Rate:  [96-103] 96 (01/15  3818) Resp:  [18] 18 (01/15 0643) BP: (153-154)/(86-87) 153/86 mmHg (01/15 0643) SpO2:  [100 %] 100 % (01/15 2993)  Physical Exam: General: aox3 seated nearly nude in bed, shoulder with bandaid from aspirate CV: Systolic murmur loudest at PMI  Neuro; non focal CBC:   CBC Latest Ref Rng 01/14/2015 01/13/2015 01/12/2015  WBC 4.0 - 10.5 K/uL 10.8(H) 13.4(H) 13.4(H)  Hemoglobin 13.0 - 17.0 g/dL 9.9(L) 10.6(L) 10.3(L)  Hematocrit 39.0 - 52.0 % 29.1(L) 31.4(L) 30.8(L)  Platelets 150 - 400 K/uL 264 243 169     BMET  Recent Labs  01/13/15 0503 01/14/15 0518  NA 134* 131*  K 3.1* 3.0*  CL 101 102  CO2 22 23  GLUCOSE 123* 241*  BUN 37* 33*  CREATININE 3.05* 2.66*  CALCIUM 8.0* 7.7*     Liver Panel   Recent  Labs  01/13/15 0503 01/14/15 0518  PROT 5.7* 5.6*  ALBUMIN 1.4* 1.3*  AST 23 23  ALT 21 14  ALKPHOS 151* 211*  BILITOT 0.2* 0.3       Sedimentation Rate  Recent Labs  01/12/15 0614  ESRSEDRATE 111*   C-Reactive Protein No results for input(s): CRP in the last 72 hours.  Micro Results: Recent Results (from the past 720 hour(s))  Urine culture     Status: None   Collection Time: 01/09/15  1:31 AM  Result Value Ref Range Status   Specimen Description URINE, CLEAN CATCH  Final   Special Requests NONE  Final   Colony Count   Final    3,000 COLONIES/ML Performed at [facility]    Culture   Final    INSIGNIFICANT GROWTH Performed at [facility]    Report Status 01/10/2015 FINAL  Final  Stool culture     Status: None   Collection Time: 01/09/15  4:09 AM  Result Value Ref Range Status   Specimen Description VAGINAL/RECTAL  Final   Special Requests Normal  Final   Culture   Final    NO SALMONELLA, SHIGELLA, CAMPYLOBACTER, YERSINIA, OR E.COLI 0157:H7 ISOLATED Performed at [facility]    Report Status 01/12/2015 FINAL  Final  Clostridium Difficile by PCR     Status: None   Collection Time: 01/09/15  4:09 AM  Result Value Ref Range Status   C difficile by pcr NEGATIVE NEGATIVE Final  Culture, blood (routine x 2)     Status: None   Collection Time: 01/10/15  4:35 AM  Result Value Ref Range Status   Specimen Description BLOOD RIGHT HAND  Final   Special Requests BOTTLES DRAWN AEROBIC AND ANAEROBIC 10CC EA  Final   Culture   Final    STAPHYLOCOCCUS AUREUS Note: SUSCEPTIBILITIES PERFORMED ON PREVIOUS CULTURE WITHIN THE LAST 5 DAYS. Note: Culture results may be compromised due to an excessive volume of blood received in culture bottles. Gram Stain Report Called to,Read Back By and Verified With:  MONICA YIM ON 01/10/2015 AT 11:36P BY WILEJ Performed at [facility]    Report Status 01/13/2015 FINAL  Final  Culture, blood (routine x 2)      Status: None   Collection Time: 01/10/15  4:46 AM  Result Value Ref Range Status   Specimen Description BLOOD LEFT ARM  Final   Special Requests BOTTLES DRAWN AEROBIC AND ANAEROBIC 10CC EA  Final   Culture   Final    STAPHYLOCOCCUS AUREUS Note: SUSCEPTIBILITIES PERFORMED ON PREVIOUS CULTURE WITHIN THE LAST 5 DAYS. Note: Culture results may be compromised  due to an excessive volume of blood received in culture bottles. Gram Stain Report Called to,Read Back By and Verified With: MONICA YIM ON 01/10/2015 AT 11:36P BY WILEJ Performed at [facility]    Report Status 01/13/2015 FINAL  Final  Respiratory virus panel (routine influenza)     Status: None   Collection Time: 01/10/15 10:34 AM  Result Value Ref Range Status   Source - RVPAN NASAL SWAB  Corrected    Comment: CORRECTED ON 01/12 AT 2036: PREVIOUSLY REPORTED AS NASAL SWAB   Respiratory Syncytial Virus A NOT DETECTED  Final   Respiratory Syncytial Virus B NOT DETECTED  Final   Influenza A NOT DETECTED  Final   Influenza B NOT DETECTED  Final   Parainfluenza 1 NOT DETECTED  Final   Parainfluenza 2 NOT DETECTED  Final   Parainfluenza 3 NOT DETECTED  Final   Metapneumovirus NOT DETECTED  Final   Rhinovirus NOT DETECTED  Final   Adenovirus NOT DETECTED  Final   Influenza A H1 NOT DETECTED  Final   Influenza A H3 NOT DETECTED  Final    Comment: (NOTE)       Normal Reference Range for each Analyte: NOT DETECTED Testing performed using the Luminex xTAG Respiratory Viral Panel test kit. The analytical performance characteristics of this assay have been determined by [facility].  The modifications have not been cleared or approved by the FDA. This assay has been validated pursuant to the CLIA regulations and is used for clinical purposes. Performed at [facility], blood (routine x 2)     Status: None   Collection Time: 01/10/15 11:19 AM  Result Value Ref Range Status   Specimen Description  BLOOD LEFT FOREARM  Final   Special Requests BOTTLES DRAWN AEROBIC ONLY 5CC  Final   Culture   Final    STAPHYLOCOCCUS AUREUS Note: RIFAMPIN AND GENTAMICIN SHOULD NOT BE USED AS SINGLE DRUGS FOR TREATMENT OF STAPH INFECTIONS. Note: Gram Stain Report Called to,Read Back By and Verified With: NURSE MONICA YIM 01/11/15 6:05AM THOMI Performed at [facility]    Report Status 01/13/2015 FINAL  Final   Organism ID, Bacteria STAPHYLOCOCCUS AUREUS  Final      Susceptibility   Staphylococcus aureus - MIC*    CLINDAMYCIN <=0.25 SENSITIVE Sensitive     ERYTHROMYCIN <=0.25 SENSITIVE Sensitive     GENTAMICIN <=0.5 SENSITIVE Sensitive     LEVOFLOXACIN 4 INTERMEDIATE Intermediate     OXACILLIN 0.5 SENSITIVE Sensitive     PENICILLIN 0.12 SENSITIVE Sensitive     RIFAMPIN 2 INTERMEDIATE Intermediate     TRIMETH/SULFA <=10 SENSITIVE Sensitive     VANCOMYCIN 1 SENSITIVE Sensitive     TETRACYCLINE <=1 SENSITIVE Sensitive     MOXIFLOXACIN 2 RESISTANT Resistant     * STAPHYLOCOCCUS AUREUS  Culture, blood (routine x 2)     Status: None   Collection Time: 01/10/15 11:27 AM  Result Value Ref Range Status   Specimen Description BLOOD LEFT ANTECUBITAL  Final   Special Requests BOTTLES DRAWN AEROBIC ONLY Paint  Final   Culture   Final    STAPHYLOCOCCUS AUREUS Note: SUSCEPTIBILITIES PERFORMED ON PREVIOUS CULTURE WITHIN THE LAST 5 DAYS. Note: Gram Stain Report Called to,Read Back By and Verified With: NURSE MONICA YIM 01/11/15 6:05AM THOMI Performed at [facility]    Report Status 01/13/2015 FINAL  Final    Studies/Results: Dg Chest 2 View  01/14/2015   CLINICAL DATA:  Right shoulder pain for 6 days.  EXAM: CHEST  2 VIEW  COMPARISON:  01/10/2015  FINDINGS: Two views of the chest demonstrate low lung volumes with bibasilar chest densities, left side greater than right. In addition, the heart appears to be enlarged. The configuration of the heart raises the possibility for pericardial fluid. No  evidence for pulmonary edema. Difficult to exclude small effusions, particularly on the left side.  IMPRESSION: Low lung volumes with bibasilar densities, left side greater than right. Basilar densities could represent a combination of atelectasis and pleural fluid.  Heart appears enlarged which may be accentuated by low lung volumes. Pericardial fluid cannot be excluded.   Electronically Signed   By: Markus Daft M.D.   On: 01/14/2015 08:17   US Renal  01/12/2015   CLINICAL DATA:  Acute renal failure  EXAM: RENAL/URINARY TRACT ULTRASOUND COMPLETE  COMPARISON:  None.  FINDINGS: Right Kidney:  Length: 12.8 cm in length. Diffuse increased cortical echogenicity suspicious for medical renal disease. No hydronephrosis. No renal calculus.  Left Kidney:  Length: . 12.6 cm in length. Diffuse increased echogenicity suspicious for medical renal disease. No hydronephrosis. No diagnostic renal calculus.  Bladder:  Appears normal for degree of bladder distention.  IMPRESSION: Bilateral kidneys shows diffuse increased echogenicity. Medical renal disease cannot be excluded. No hydronephrosis or diagnostic renal calculus.   Electronically Signed   By: Lahoma Crocker M.D.   On: 01/12/2015 16:08   Dg Fluoro Guide Ndl Plc/bx  01/14/2015   CLINICAL DATA:  Right shoulder pain and effusion.  Bacteremia.  EXAM: RIGHT SHOULDER ASPIRATION UNDER FLUOROSCOPY  FLUOROSCOPY TIME:  0 min 24 seconds  PROCEDURE: Overlying skin prepped with Betadine, draped in the usual sterile fashion, and infiltrated locally with buffered Lidocaine. Twenty gauge spinal needle was placed in the right glenohumeral joint under direct fluoroscopic visualization.  2 cc of bloody cloudy fluid was aspirated from the joint. Mr. Koepp tolerated the procedure well.  IMPRESSION: Technically successful right shoulder aspiration under fluoroscopy. Fluid sent for laboratory analysis.   Electronically Signed   By: Rozetta Nunnery M.D.   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 Problems:   Tobacco abuse   Noncompliance   Dehydration   Diarrhea   Diabetes type 2, uncontrolled   Hyperglycemia   Uncontrolled hypertension   Diabetic foot ulcers   Pain   Diabetic ulcer of left foot associated with type 2 diabetes mellitus   Diabetic ulcer of right foot associated with type 2 diabetes mellitus   Osteomyelitis   Shoulder pain   Thigh pain, musculoskeletal   Septic shock   Staphylococcus aureus bacteremia with sepsis   Smoker   Elevated troponin   Acute renal failure   Bacteremia   Septic joint of right shoulder region   Infective myositis of left thigh   Purulent pericarditis   Mitral regurgitation    Kasey Ewings is a 35 y.o. 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  #1      [facility] Team Staphylococcus aureus bacteremia   Staphylococcus aureus bacteremia (SAB) is associated with a high rate of complications and mortality.  Specific aspects of clinical management are  critical to optimizing the outcome of patients with SAB.  Therefore, the [facility] Antimicrobial Management Team [facility]) has initiated an intervention aimed at improving the management of SAB at [facility].  To do so, Infectious Diseases physicians are providing an evidence-based consult for the management of all patients with SAB.     Yes No Comments  Perform follow-up blood cultures (even if the patient is afebrile) to ensure clearance of bacteremia []  []   We NEED REPEAT BLOOD CULTURES TO PROVE CLEARANCE , I SEE NONE DONE AFTER THE 11TH,    Remove vascular catheter and obtain follow-up blood cultures after the removal of the catheter []  []   do not place central access until we have cleared his bacteremia   Perform echocardiography to evaluate for endocarditis (transthoracic ECHO is 40-50% sensitive, TEE is > 90% sensitive) [x]  []  TEE SHOWS infectious aneurysm and perforation of the base of the medial scallop of the posterior mitral leaflet. There is an associated 4x6 mm vegetation. There is severe mitral insufficiency through the perforation in the posterior leaflet with reversal of flow in both the left and right pulmonary veins         Ensure source control []  []  56k wbc with 94% PMN on shoulder aspirate after SIX days of antibiotics, no crystals = SEPTIC SHOULDER  HE NEEDS FORMAL I and D of RIGHT SHOULDER IN OR  Would contact DR. BLACKMAN v  With re to his legs, I think he has myositis and this will EVOLVE into pyomyositis, I would recommend getting MRI of BOTH THIGHS one week after last scan  He has been seen also by Dr. Roxy Manns and Dr. Scot Dock from [facility] and GREATLY appreciate their help in addition to Dr. Ninfa Linden  I think that it is not worth performing contrast studies on him but I agree with Dr Roxy Manns and would MRI the brain because it would make a difference in terms of antibiotics and in terms of contraindications to anticoagulation if he has septic emboli to the CNS   Investigate for "metastatic" sites of infection []  []  .SEE ABOVE DISCUSSION  Change antibiotic therapy to  cefazolin   []  []  Beta-lactam antibiotics are preferred for MSSA due to higher cure rates.   If on Vancomycin, goal trough should be 15 - 20 mcg/mL  Estimated duration of IV antibiotic therapy:     8 WEEKS POST HEART SURGERY ALL OTHER POTENTIAL SURGERIES  []  []  Consult case management for probably prolonged outpatient IV antibiotic therapy       LOS: 5 days   Alcide Evener 01/14/2015, 2:45 PM

## 2015-01-15 ENCOUNTER — Inpatient Hospital Stay (HOSPITAL_COMMUNITY): Payer: Medicaid Other

## 2015-01-15 DIAGNOSIS — M79609 Pain in unspecified limb: Secondary | ICD-10-CM

## 2015-01-15 DIAGNOSIS — R609 Edema, unspecified: Secondary | ICD-10-CM

## 2015-01-15 DIAGNOSIS — A4101 Sepsis due to Methicillin susceptible Staphylococcus aureus: Principal | ICD-10-CM

## 2015-01-15 DIAGNOSIS — M60052 Infective myositis, left thigh: Secondary | ICD-10-CM

## 2015-01-15 DIAGNOSIS — M549 Dorsalgia, unspecified: Secondary | ICD-10-CM | POA: Insufficient documentation

## 2015-01-15 DIAGNOSIS — I340 Nonrheumatic mitral (valve) insufficiency: Secondary | ICD-10-CM

## 2015-01-15 DIAGNOSIS — M009 Pyogenic arthritis, unspecified: Secondary | ICD-10-CM

## 2015-01-15 DIAGNOSIS — K089 Disorder of teeth and supporting structures, unspecified: Secondary | ICD-10-CM | POA: Insufficient documentation

## 2015-01-15 LAB — COMPREHENSIVE METABOLIC PANEL
ALBUMIN: 1.3 g/dL — AB (ref 3.5–5.2)
ALT: 14 U/L (ref 0–53)
ANION GAP: 9 (ref 5–15)
AST: 40 U/L — ABNORMAL HIGH (ref 0–37)
Alkaline Phosphatase: 263 U/L — ABNORMAL HIGH (ref 39–117)
BUN: 27 mg/dL — ABNORMAL HIGH (ref 6–23)
CO2: 22 mmol/L (ref 19–32)
CREATININE: 2.19 mg/dL — AB (ref 0.50–1.35)
Calcium: 7.9 mg/dL — ABNORMAL LOW (ref 8.4–10.5)
Chloride: 104 mEq/L (ref 96–112)
GFR calc Af Amer: 43 mL/min — ABNORMAL LOW (ref >90)
GFR, EST NON AFRICAN AMERICAN: 37 mL/min — AB (ref >90)
GLUCOSE: 206 mg/dL — AB (ref 70–99)
Potassium: 3.3 mmol/L — ABNORMAL LOW (ref 3.5–5.1)
SODIUM: 135 mmol/L (ref 135–145)
Total Bilirubin: 0.3 mg/dL (ref 0.3–1.2)
Total Protein: 6 g/dL (ref 6.0–8.3)

## 2015-01-15 LAB — GLUCOSE, CAPILLARY
GLUCOSE-CAPILLARY: 184 mg/dL — AB (ref 70–99)
Glucose-Capillary: 150 mg/dL — ABNORMAL HIGH (ref 70–99)
Glucose-Capillary: 179 mg/dL — ABNORMAL HIGH (ref 70–99)
Glucose-Capillary: 198 mg/dL — ABNORMAL HIGH (ref 70–99)
Glucose-Capillary: 219 mg/dL — ABNORMAL HIGH (ref 70–99)

## 2015-01-15 LAB — CBC
HCT: 29.3 % — ABNORMAL LOW (ref 39.0–52.0)
Hemoglobin: 9.8 g/dL — ABNORMAL LOW (ref 13.0–17.0)
MCH: 27.9 pg (ref 26.0–34.0)
MCHC: 33.4 g/dL (ref 30.0–36.0)
MCV: 83.5 fL (ref 78.0–100.0)
PLATELETS: 411 10*3/uL — AB (ref 150–400)
RBC: 3.51 MIL/uL — ABNORMAL LOW (ref 4.22–5.81)
RDW: 13.7 % (ref 11.5–15.5)
WBC: 12.4 10*3/uL — AB (ref 4.0–10.5)

## 2015-01-15 MED ORDER — NAFCILLIN SODIUM 2 G IJ SOLR
2.0000 g | INTRAVENOUS | Status: DC
  Filled 2015-01-15 (×5): qty 2000

## 2015-01-15 MED ORDER — NAFCILLIN SODIUM 2 G IJ SOLR
2.0000 g | INTRAMUSCULAR | Status: DC
  Filled 2015-01-15 (×8): qty 2000

## 2015-01-15 MED ORDER — NAFCILLIN SODIUM 2 G IJ SOLR
2.0000 g | INTRAVENOUS | Status: AC
  Administered 2015-01-15 – 2015-01-18 (×19): 2 g via INTRAVENOUS
  Filled 2015-01-15 (×23): qty 2000

## 2015-01-15 NOTE — Progress Notes (Signed)
[name] ROUNDING NOTE   Subjective:   Interval History: quite quiet this morning ---girlfriend in room. He is ambulating to bathroom. Disseminated MSSA infection     Objective:  Vital signs in last 24 hours:  Temp:  [98.1 F (36.7 C)-98.4 F (36.9 C)] 98.1 F (36.7 C) (01/16 0454) Pulse Rate:  [90-95] 94 (01/16 0945) Resp:  [18] 18 (01/16 0454) BP: (151-166)/(81-90) 157/84 mmHg (01/16 0945) SpO2:  [94 %-99 %] 95 % (01/16 0454)  Weight change:  Filed Weights   01/09/15 0017 01/09/15 0329  Weight: 99.791 kg (220 lb) 102.694 kg (226 lb 6.4 oz)    Intake/Output: I/O last 3 completed shifts: In: 10 [P.O.:1120; IV Piggyback:150] Out: 3250 [Urine:3250]   Intake/Output this shift:  Total I/O In: 0  Out: 260 [Urine:260]  CVS- RRR  murmur systolic  RS- CTA ABD- BS present soft non-distended EXT- 1 + edema with plantar sores   Basic Metabolic Panel:  Recent Labs Lab 01/09/15 0500 01/10/15 0508 01/10/15 1121 01/11/15 0235 01/11/15 0953 01/12/15 0614 01/13/15 0503 01/14/15 0518  NA 136 132*  --  131*  --  129* 134* 131*  K 3.0* 3.0*  --  3.6  --  3.0* 3.1* 3.0*  CL 105 103  --  100  --  98 101 102  CO2 23 23  --  21  --  _0 GLUCOSE 253* 103*  --  172*  --  97 123* 241*  BUN 17 21  --  29*  --  34* 37* 33*  CREATININE 1.86* 2.15*  --  2.52*  --  3.15* 3.05* 2.66*  CALCIUM 7.7* 8.0*  --  8.0*  --  7.5* 8.0* 7.7*  MG 1.5  --  1.6  --  1.9  --  2.3  --     Liver Function Tests:  Recent Labs Lab 01/09/15 0021 01/11/15 0235 01/12/15 0614 01/13/15 0503 01/14/15 0518  AST _1 ALT _2 ALKPHOS 86 80 81 151* 211*  BILITOT 0.6 0.6 0.6 0.2* 0.3  PROT 6.0 6.3 5.0* 5.7* 5.6*  ALBUMIN 2.1* 1.7* 1.4* 1.4* 1.3*   No results for input(s): LIPASE, AMYLASE in the last 168 hours. No results for input(s): AMMONIA in the last 168 hours.  CBC:  Recent Labs Lab 01/09/15 0021  01/10/15 0508 01/11/15 0235  01/12/15 0614 01/13/15 0503 01/14/15 0518  WBC 11.3*  < > 13.8* 15.3* 13.4* 13.4* 10.8*  NEUTROABS 9.3*  --   --   --   --   --   --   HGB 11.7*  < > 12.2* 12.1* 10.3* 10.6* 9.9*  HCT 34.7*  < > 35.1* 34.8* 30.8* 31.4* 29.1*  MCV 82.6  < > 81.6 81.3 82.1 82.4 83.1  PLT 179  < > 126* 153 169 243 264  < > = values in this interval not displayed.  Cardiac Enzymes:  Recent Labs Lab 01/10/15 2036 01/11/15 0235 01/12/15 0614 01/12/15 1130 01/12/15 1849  CKTOTAL  --   --  49  --   --   TROPONINI 0.33* 0.28* 0.84* 0.65* 0.47*    BNP: Invalid input(s): POCBNP  CBG:  Recent Labs Lab 01/14/15 1632 01/14/15 2114 01/15/15 0022 01/15/15 0422 01/15/15 0747  GLUCAP 156* 192* 150* 179* 219*    Microbiology: Results for orders placed or performed during the hospital encounter of 01/09/15  Urine culture     Status: None  Collection Time: 01/09/15  1:31 AM  Result Value Ref Range Status   Specimen Description URINE, CLEAN CATCH  Final   Special Requests NONE  Final   Colony Count   Final    3,000 COLONIES/ML Performed at [facility]    Culture   Final    INSIGNIFICANT GROWTH Performed at [facility]    Report Status 01/10/2015 FINAL  Final  Stool culture     Status: None   Collection Time: 01/09/15  4:09 AM  Result Value Ref Range Status   Specimen Description VAGINAL/RECTAL  Final   Special Requests Normal  Final   Culture   Final    NO SALMONELLA, SHIGELLA, CAMPYLOBACTER, YERSINIA, OR E.COLI 0157:H7 ISOLATED Performed at [facility]    Report Status 01/12/2015 FINAL  Final  Clostridium Difficile by PCR     Status: None   Collection Time: 01/09/15  4:09 AM  Result Value Ref Range Status   C difficile by pcr NEGATIVE NEGATIVE Final  Culture, blood (routine x 2)     Status: None   Collection Time: 01/10/15  4:35 AM  Result Value Ref Range Status   Specimen Description BLOOD RIGHT HAND  Final   Special Requests BOTTLES DRAWN AEROBIC AND  ANAEROBIC 10CC EA  Final   Culture   Final    STAPHYLOCOCCUS AUREUS Note: SUSCEPTIBILITIES PERFORMED ON PREVIOUS CULTURE WITHIN THE LAST 5 DAYS. Note: Culture results may be compromised due to an excessive volume of blood received in culture bottles. Gram Stain Report Called to,Read Back By and Verified With:  MONICA YIM ON 01/10/2015 AT 11:36P BY WILEJ Performed at [facility]    Report Status 01/13/2015 FINAL  Final  Culture, blood (routine x 2)     Status: None   Collection Time: 01/10/15  4:46 AM  Result Value Ref Range Status   Specimen Description BLOOD LEFT ARM  Final   Special Requests BOTTLES DRAWN AEROBIC AND ANAEROBIC 10CC EA  Final   Culture   Final    STAPHYLOCOCCUS AUREUS Note: SUSCEPTIBILITIES PERFORMED ON PREVIOUS CULTURE WITHIN THE LAST 5 DAYS. Note: Culture results may be compromised due to an excessive volume of blood received in culture bottles. Gram Stain Report Called to,Read Back By and Verified With: MONICA YIM ON 01/10/2015 AT 11:36P BY WILEJ Performed at [facility]    Report Status 01/13/2015 FINAL  Final  Respiratory virus panel (routine influenza)     Status: None   Collection Time: 01/10/15 10:34 AM  Result Value Ref Range Status   Source - RVPAN NASAL SWAB  Corrected    Comment: CORRECTED ON 01/12 AT 2036: PREVIOUSLY REPORTED AS NASAL SWAB   Respiratory Syncytial Virus A NOT DETECTED  Final   Respiratory Syncytial Virus B NOT DETECTED  Final   Influenza A NOT DETECTED  Final   Influenza B NOT DETECTED  Final   Parainfluenza 1 NOT DETECTED  Final   Parainfluenza 2 NOT DETECTED  Final   Parainfluenza 3 NOT DETECTED  Final   Metapneumovirus NOT DETECTED  Final   Rhinovirus NOT DETECTED  Final   Adenovirus NOT DETECTED  Final   Influenza A H1 NOT DETECTED  Final   Influenza A H3 NOT DETECTED  Final    Comment: (NOTE)       Normal Reference Range for each Analyte: NOT DETECTED Testing performed using the Luminex xTAG Respiratory Viral  Panel test kit. The analytical performance characteristics of this assay have  been determined by [facility].  The modifications have not been cleared or approved by the FDA. This assay has been validated pursuant to the CLIA regulations and is used for clinical purposes. Performed at [facility], blood (routine x 2)     Status: None   Collection Time: 01/10/15 11:19 AM  Result Value Ref Range Status   Specimen Description BLOOD LEFT FOREARM  Final   Special Requests BOTTLES DRAWN AEROBIC ONLY 5CC  Final   Culture   Final    STAPHYLOCOCCUS AUREUS Note: RIFAMPIN AND GENTAMICIN SHOULD NOT BE USED AS SINGLE DRUGS FOR TREATMENT OF STAPH INFECTIONS. Note: Gram Stain Report Called to,Read Back By and Verified With: NURSE MONICA YIM 01/11/15 6:05AM THOMI Performed at [facility]    Report Status 01/13/2015 FINAL  Final   Organism ID, Bacteria STAPHYLOCOCCUS AUREUS  Final      Susceptibility   Staphylococcus aureus - MIC*    CLINDAMYCIN <=0.25 SENSITIVE Sensitive     ERYTHROMYCIN <=0.25 SENSITIVE Sensitive     GENTAMICIN <=0.5 SENSITIVE Sensitive     LEVOFLOXACIN 4 INTERMEDIATE Intermediate     OXACILLIN 0.5 SENSITIVE Sensitive     PENICILLIN 0.12 SENSITIVE Sensitive     RIFAMPIN 2 INTERMEDIATE Intermediate     TRIMETH/SULFA <=10 SENSITIVE Sensitive     VANCOMYCIN 1 SENSITIVE Sensitive     TETRACYCLINE <=1 SENSITIVE Sensitive     MOXIFLOXACIN 2 RESISTANT Resistant     * STAPHYLOCOCCUS AUREUS  Culture, blood (routine x 2)     Status: None   Collection Time: 01/10/15 11:27 AM  Result Value Ref Range Status   Specimen Description BLOOD LEFT ANTECUBITAL  Final   Special Requests BOTTLES DRAWN AEROBIC ONLY [place]  Final   Culture   Final    STAPHYLOCOCCUS AUREUS Note: SUSCEPTIBILITIES PERFORMED ON PREVIOUS CULTURE WITHIN THE LAST 5 DAYS. Note: Gram Stain Report Called to,Read Back By and Verified With: NURSE MONICA YIM 01/11/15 6:05AM THOMI Performed at  [facility]    Report Status 01/13/2015 FINAL  Final  Culture, blood (routine x 2)     Status: None (Preliminary result)   Collection Time: 01/13/15  7:20 PM  Result Value Ref Range Status   Specimen Description BLOOD RIGHT ARM  Final   Special Requests BOTTLES DRAWN AEROBIC AND ANAEROBIC 5CC  Final   Culture   Final           BLOOD CULTURE RECEIVED NO GROWTH TO DATE CULTURE WILL BE HELD FOR 5 DAYS BEFORE ISSUING A FINAL NEGATIVE REPORT Performed at [facility]    Report Status PENDING  Incomplete  Culture, blood (routine x 2)     Status: None (Preliminary result)   Collection Time: 01/13/15  7:20 PM  Result Value Ref Range Status   Specimen Description BLOOD RIGHT ARM  Final   Special Requests BOTTLES DRAWN AEROBIC AND ANAEROBIC 3CC  Final   Culture   Final           BLOOD CULTURE RECEIVED NO GROWTH TO DATE CULTURE WILL BE HELD FOR 5 DAYS BEFORE ISSUING A FINAL NEGATIVE REPORT Performed at [facility]    Report Status PENDING  Incomplete    Coagulation Studies:  Recent Labs  01/14/15 0518  LABPROT 15.6*  INR 1.22    Urinalysis:  Recent Labs  01/14/15 [place]  LABSPEC 1.015  PHURINE 5.5  GLUCOSEU [place] MODERATE*  BILIRUBINUR NEGATIVE  KETONESUR  NEGATIVE  PROTEINUR >300*  UROBILINOGEN 1.0  NITRITE NEGATIVE  LEUKOCYTESUR NEGATIVE      Imaging: Dg Orthopantogram  01/14/2015   CLINICAL DATA:  Staphylococcal endocarditis.  Poor dentition.  EXAM: ORTHOPANTOGRAM/PANORAMIC  COMPARISON:  None.  FINDINGS: Extensive dental disease with caries involving multiple teeth, including numbers 7, 8, 9, 10, 11, 12, 13, 14, 15, 16, 19, 20, 29, 30, 31 and 32. Periapical lucencies are present adjacent to teeth numbers 29 and 30.  IMPRESSION: Extensive dental disease as described.   Electronically Signed   By: Evangeline Dakin M.D.   On: 01/14/2015 21:31   Dg Chest 2 View  01/14/2015   CLINICAL DATA:  Right shoulder pain for 6 days.   EXAM: CHEST  2 VIEW  COMPARISON:  01/10/2015  FINDINGS: Two views of the chest demonstrate low lung volumes with bibasilar chest densities, left side greater than right. In addition, the heart appears to be enlarged. The configuration of the heart raises the possibility for pericardial fluid. No evidence for pulmonary edema. Difficult to exclude small effusions, particularly on the left side.  IMPRESSION: Low lung volumes with bibasilar densities, left side greater than right. Basilar densities could represent a combination of atelectasis and pleural fluid.  Heart appears enlarged which may be accentuated by low lung volumes. Pericardial fluid cannot be excluded.   Electronically Signed   By: Markus Daft M.D.   On: 01/14/2015 08:17   Mr Brain Wo Contrast  01/14/2015   CLINICAL DATA:  35 year old male with history of staph aureus bacteremia and endocarditis.  EXAM: MRI HEAD WITHOUT CONTRAST  TECHNIQUE: Multiplanar, multiecho pulse sequences of the brain and surrounding structures were obtained without intravenous contrast.  COMPARISON:  Prior study from 04/25/2012  FINDINGS: Cerebral volume within normal limits for patient age. No significant chronic small vessel ischemic changes identified. No mass lesion, midline shift, or mass effect. Ventricles are normal in size without evidence of hydrocephalus. No extra-axial fluid collection.  There are multiple subcentimeter ischemic infarcts involving predominantly the supratentorial brain. These involve both frontal lobes, the right parietal lobe, left temporal lobe, ischemic infarct within the right is cerebral hemisphere is located in the anterior right frontal lobe and measures 5 mm (series 3, image 36). The largest infarct in the left cerebral hemisphere is located in the deep white matter of the anterior left frontal lobe and measures 13 mm (series 3, image 30). There are infarcts involving the bilateral basal ganglia, the most prominent of which located in the  right thalamus which measures 4 mm. There is a single infratentorial infarct within the left cerebellar hemisphere measuring 4 mm. These are most likely embolic in nature given the history of endocarditis and multiple vascular distributions. Scattered susceptibility artifact seen associated with a few of these infarcts, likely reflecting small petechial hemorrhages. No frank hemorrhagic conversion There is associated T2/FLAIR signal intensity without significant mass effect.  Craniocervical junction within normal limits. Pituitary gland normal.  No acute abnormality seen about the orbits.  Paranasal sinuses are clear.  No mastoid effusion.  Bone marrow signal intensity within normal limits. Visualized upper cervical spine unremarkable.  Scalp soft tissues within normal limits.  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 These are likely embolic in nature given the history of endocarditis and involvement of multiple vascular distributions. No significant mass effect. There are likely scattered small petechial hemorrhages with a number these infarcts without frank hemorrhage.   Electronically Signed   By: Marland Kitchen  Jeannine Boga M.D.   On: 01/14/2015 21:24   Mr Tibia Fibula Right Wo Contrast  01/15/2015   CLINICAL DATA:  Diabetic patient with bacteremia and right leg pain.  EXAM: MRI OF LOWER RIGHT EXTREMITY WITHOUT CONTRAST  TECHNIQUE: Multiplanar, multisequence MR imaging of the right lower leg was performed. No intravenous contrast was administered.  COMPARISON:  None.  FINDINGS: The axial and coronal sequences incidentally include the left lower leg. There is marked subcutaneous edema of both lower legs which appears slightly worse on the left. No focal subcutaneous fluid collection is identified. Mildly increased T2 signal is seen in the gastrocnemius, soleus and and tibialis posterior muscles bilaterally which appears symmetric. No intramuscular  fluid collection is identified. No bone marrow signal abnormality to suggest osteomyelitis is seen. There is no muscle or tendon tear.  IMPRESSION: Intense bilateral lower leg subcutaneous edema appears worse on the left and could be due to dependent change or cellulitis.  Mildly increased T2 signal in the gastrocnemius, soleus and tibialis posterior muscles bilaterally could be due to myositis or early changes of denervation atrophy in this diabetic patient.  Negative for abscess or osteomyelitis.   Electronically Signed   By: Inge Rise M.D.   On: 01/15/2015 08:28   Dg Fluoro Guide Ndl Plc/bx  01/14/2015   CLINICAL DATA:  Right shoulder pain and effusion.  Bacteremia.  EXAM: RIGHT SHOULDER ASPIRATION UNDER FLUOROSCOPY  FLUOROSCOPY TIME:  0 min 24 seconds  PROCEDURE: Overlying skin prepped with Betadine, draped in the usual sterile fashion, and infiltrated locally with buffered Lidocaine. Twenty gauge spinal needle was placed in the right glenohumeral joint under direct fluoroscopic visualization.  2 cc of bloody cloudy fluid was aspirated from the joint. Mr. Sahakian tolerated the procedure well.  IMPRESSION: Technically successful right shoulder aspiration under fluoroscopy. Fluid sent for laboratory analysis.   Electronically Signed   By: Rozetta Nunnery M.D.   On: 01/14/2015 09:30     Medications:   . 0.9 % NaCl with KCl 40 mEq / L Stopped (01/15/15 0753)   . chlorhexidine  15 mL Mouth/Throat BID  . feeding supplement (GLUCERNA SHAKE)  237 mL Oral BID BM  . feeding supplement (PRO-STAT SUGAR FREE 64)  30 mL Oral Q1500  . Influenza vac split quadrivalent PF  0.5 mL Intramuscular Tomorrow-1000  . insulin aspart  0-15 Units Subcutaneous TID WC  . insulin aspart  0-5 Units Subcutaneous QHS  . insulin aspart protamine- aspart  20 Units Subcutaneous BID WC  . living well with diabetes book   Does not apply Once  . metoprolol tartrate  25 mg Oral BID  . nafcillin  2 g Intravenous Q4H  .  neomycin-bacitracin-polymyxin   Topical Daily  . nicotine  7 mg Transdermal Daily  . sodium chloride  500 mL Intravenous Once   acetaminophen **OR** acetaminophen, alum & mag hydroxide-simeth, hydrALAZINE, HYDROmorphone (DILAUDID) injection, ondansetron **OR** ondansetron (ZOFRAN) IV, oxyCODONE  Assessment/ Plan:  Dwayne [name] is a 35 y.o. male with a history of Uncontrolled HTN, DM2 who presents to the ED with complaints of diarrhea and fevers and chills x 24 hours. He reports having weakness and fatigue and He denies any nausea or vomiting. No one is sick at home, and he denies taking any recent antibiotic therapy. He reports that he has been out of his medications for months, and has not seen his PCP in months as well. 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 Was found to have staph bacteremia. Baseline creatinine was normal although creatinine was elevated on admission. No contrast administration and no NSAIDS. Received vancomycin. Outpatient medications included lisinopril and metformin Staph bacteremia endocarditis evaluation and recommendations by ID, VVS and TCTS   Acute renal failure or acute on chronic renal failure in setting of staph aureus bacteremia. This may be ATN secondary to sepsis with underlying diabetic nephropathy and chronic renal disease normal U/S and creatinine improved  Anemia no ESA at present would not use Iron is setting in infection  Hypokalemia Replace with potassium  Hyponatremia secondary to ARF  Renal function appears to be improving although the patient is very unwell with concerning dissemination of Staph bacteremia     LOS: 6 [facility]ne [name] W _0 _1 :12 AM

## 2015-01-15 NOTE — Progress Notes (Signed)
VASCULAR LAB PRELIMINARY  PRELIMINARY  PRELIMINARY  PRELIMINARY  Left lower extremity venous Dopplers completed.    Preliminary report:  There is no DVT or SVT noted in the left lower extremity.  Some areas of muscle tissue in the left calf and right thigh appear different than the surrounding muscle tissue.  Tekila Caillouet, RVT 01/15/2015, 5:25 PM

## 2015-01-15 NOTE — Progress Notes (Signed)
[place] [facility]    Subjective: C/o mid to  lower back pain   Antibiotics:  Anti-infectives    Start     Dose/Rate Route Frequency Ordered Stop   01/15/15 0930  nafcillin injection 2 g     2 g Intravenous Every 4 hours 01/15/15 0921     01/14/15 1500  DAPTOmycin (CUBICIN) 600 mg in sodium chloride 0.9 % IVPB  Status:  Discontinued     600 mg224 mL/hr over 30 Minutes Intravenous Every 48 hours 01/12/15 1421 01/13/15 1239   01/13/15 1500  ceFAZolin (ANCEF) IVPB 2 g/50 mL premix  Status:  Discontinued     2 g100 mL/hr over 30 Minutes Intravenous 3 times per day 01/13/15 1407 01/15/15 0921   01/12/15 1600  ceFAZolin (ANCEF) IVPB 2 g/50 mL premix  Status:  Discontinued     2 g100 mL/hr over 30 Minutes Intravenous Every 12 hours 01/12/15 1419 01/13/15 1407   01/11/15 1200  DAPTOmycin (CUBICIN) 600 mg in sodium chloride 0.9 % IVPB  Status:  Discontinued     600 mg224 mL/hr over 30 Minutes Intravenous Every 24 hours 01/11/15 1003 01/12/15 1421   01/10/15 1200  vancomycin (VANCOCIN) IVPB 1000 mg/200 mL premix  Status:  Discontinued     1,000 mg200 mL/hr over 60 Minutes Intravenous Every 8 hours 01/10/15 1044 01/11/15 0947   01/10/15 1100  piperacillin-tazobactam (ZOSYN) IVPB 3.375 g  Status:  Discontinued     3.375 g12.5 mL/hr over 240 Minutes Intravenous Every 8 hours 01/10/15 1043 01/11/15 0902   01/09/15 0700  metroNIDAZOLE (FLAGYL) IVPB 500 mg  Status:  Discontinued     500 mg100 mL/hr over 60 Minutes Intravenous Every 8 hours 01/09/15 0651 01/10/15 1018      Medications: Scheduled Meds: . chlorhexidine  15 mL Mouth/Throat BID  . feeding supplement (GLUCERNA SHAKE)  237 mL Oral BID BM  . feeding supplement (PRO-STAT SUGAR FREE 64)  30 mL Oral Q1500  . Influenza vac split quadrivalent PF  0.5 mL Intramuscular Tomorrow-1000  . insulin aspart  0-15 Units Subcutaneous TID WC  . insulin aspart  0-5 Units Subcutaneous QHS  . insulin aspart protamine- aspart  20  Units Subcutaneous BID WC  . living well with diabetes book   Does not apply Once  . metoprolol tartrate  25 mg Oral BID  . nafcillin  2 g Intravenous Q4H  . neomycin-bacitracin-polymyxin   Topical Daily  . nicotine  7 mg Transdermal Daily  . sodium chloride  500 mL Intravenous Once   Continuous Infusions: . 0.9 % NaCl with KCl 40 mEq / L Stopped (01/15/15 0753)   PRN Meds:.acetaminophen **OR** acetaminophen, alum & mag hydroxide-simeth, hydrALAZINE, HYDROmorphone (DILAUDID) injection, ondansetron **OR** ondansetron (ZOFRAN) IV, oxyCODONE    Objective: Weight change:   Intake/Output Summary (Last 24 hours) at 01/15/15 1257 Last data filed at 01/15/15 0901  Gross per 24 hour  Intake    720 ml  Output   2160 ml  Net  -1440 ml   Blood pressure 157/84, pulse 94, temperature 98.1 F (36.7 C), temperature source Oral, resp. rate 18, height 6' 2"  (1.88 m), weight 226 lb 6.4 oz (102.694 kg), SpO2 95 %. Temp:  [98.1 F (36.7 C)-98.4 F (36.9 C)] 98.1 F (36.7 C) (01/16 0454) Pulse Rate:  [90-95] 94 (01/16 0945) Resp:  [18] 18 (01/16 0454) BP: (151-166)/(81-90) 157/84 mmHg (01/16 0945) SpO2:  [94 %-99 %] 95 % (01/16 0454)  Physical Exam: General:  aox3  Dr Ninfa Linden examining shoulder CV: Systolic murmur loudest at PMI Pulm: clear, no wheezes or rhonchi Thighs: has medial tenderness esp on left which is worse Neuro; non focal CBC:   CBC Latest Ref Rng 01/14/2015 01/13/2015 01/12/2015  WBC 4.0 - 10.5 K/uL 10.8(H) 13.4(H) 13.4(H)  Hemoglobin 13.0 - 17.0 g/dL 9.9(L) 10.6(L) 10.3(L)  Hematocrit 39.0 - 52.0 % 29.1(L) 31.4(L) 30.8(L)  Platelets 150 - 400 K/uL 264 243 169     BMET  Recent Labs  01/13/15 0503 01/14/15 0518  NA 134* 131*  K 3.1* 3.0*  CL 101 102  CO2 22 23  GLUCOSE 123* 241*  BUN 37* 33*  CREATININE 3.05* 2.66*  CALCIUM 8.0* 7.7*     Liver Panel   Recent Labs  01/13/15 0503 01/14/15 0518  PROT 5.7* 5.6*  ALBUMIN 1.4* 1.3*  AST 23 23  ALT 21 14   ALKPHOS 151* 211*  BILITOT 0.2* 0.3       Sedimentation Rate No results for input(s): ESRSEDRATE in the last 72 hours. C-Reactive Protein No results for input(s): CRP in the last 72 hours.  Micro Results: Recent Results (from the past 720 hour(s))  Urine culture     Status: None   Collection Time: 01/09/15  1:31 AM  Result Value Ref Range Status   Specimen Description URINE, CLEAN CATCH  Final   Special Requests NONE  Final   Colony Count   Final    3,000 COLONIES/ML Performed at [facility]    Culture   Final    INSIGNIFICANT GROWTH Performed at [facility]    Report Status 01/10/2015 FINAL  Final  Stool culture     Status: None   Collection Time: 01/09/15  4:09 AM  Result Value Ref Range Status   Specimen Description VAGINAL/RECTAL  Final   Special Requests Normal  Final   Culture   Final    NO SALMONELLA, SHIGELLA, CAMPYLOBACTER, YERSINIA, OR E.COLI 0157:H7 ISOLATED Performed at [facility]    Report Status 01/12/2015 FINAL  Final  Clostridium Difficile by PCR     Status: None   Collection Time: 01/09/15  4:09 AM  Result Value Ref Range Status   C difficile by pcr NEGATIVE NEGATIVE Final  Culture, blood (routine x 2)     Status: None   Collection Time: 01/10/15  4:35 AM  Result Value Ref Range Status   Specimen Description BLOOD RIGHT HAND  Final   Special Requests BOTTLES DRAWN AEROBIC AND ANAEROBIC 10CC EA  Final   Culture   Final    STAPHYLOCOCCUS AUREUS Note: SUSCEPTIBILITIES PERFORMED ON PREVIOUS CULTURE WITHIN THE LAST 5 DAYS. Note: Culture results may be compromised due to an excessive volume of blood received in culture bottles. Gram Stain Report Called to,Read Back By and Verified With:  MONICA YIM ON 01/10/2015 AT 11:36P BY WILEJ Performed at [facility]    Report Status 01/13/2015 FINAL  Final  Culture, blood (routine x 2)     Status: None   Collection Time: 01/10/15  4:46 AM  Result Value Ref Range Status    Specimen Description BLOOD LEFT ARM  Final   Special Requests BOTTLES DRAWN AEROBIC AND ANAEROBIC 10CC EA  Final   Culture   Final    STAPHYLOCOCCUS AUREUS Note: SUSCEPTIBILITIES PERFORMED ON PREVIOUS CULTURE WITHIN THE LAST 5 DAYS. Note: Culture results may be compromised due to an excessive volume of blood received in culture bottles. Gram Stain Report Called to,Read  Back By and Verified With: MONICA YIM ON 01/10/2015 AT 11:36P BY WILEJ Performed at [facility]    Report Status 01/13/2015 FINAL  Final  Respiratory virus panel (routine influenza)     Status: None   Collection Time: 01/10/15 10:34 AM  Result Value Ref Range Status   Source - RVPAN NASAL SWAB  Corrected    Comment: CORRECTED ON 01/12 AT 2036: PREVIOUSLY REPORTED AS NASAL SWAB   Respiratory Syncytial Virus A NOT DETECTED  Final   Respiratory Syncytial Virus B NOT DETECTED  Final   Influenza A NOT DETECTED  Final   Influenza B NOT DETECTED  Final   Parainfluenza 1 NOT DETECTED  Final   Parainfluenza 2 NOT DETECTED  Final   Parainfluenza 3 NOT DETECTED  Final   Metapneumovirus NOT DETECTED  Final   Rhinovirus NOT DETECTED  Final   Adenovirus NOT DETECTED  Final   Influenza A H1 NOT DETECTED  Final   Influenza A H3 NOT DETECTED  Final    Comment: (NOTE)       Normal Reference Range for each Analyte: NOT DETECTED Testing performed using the Luminex xTAG Respiratory Viral Panel test kit. The analytical performance characteristics of this assay have been determined by [facility].  The modifications have not been cleared or approved by the FDA. This assay has been validated pursuant to the CLIA regulations and is used for clinical purposes. Performed at [facility], blood (routine x 2)     Status: None   Collection Time: 01/10/15 11:19 AM  Result Value Ref Range Status   Specimen Description BLOOD LEFT FOREARM  Final   Special Requests BOTTLES DRAWN AEROBIC ONLY 5CC  Final    Culture   Final    STAPHYLOCOCCUS AUREUS Note: RIFAMPIN AND GENTAMICIN SHOULD NOT BE USED AS SINGLE DRUGS FOR TREATMENT OF STAPH INFECTIONS. Note: Gram Stain Report Called to,Read Back By and Verified With: NURSE MONICA YIM 01/11/15 6:05AM THOMI Performed at [facility]    Report Status 01/13/2015 FINAL  Final   Organism ID, Bacteria STAPHYLOCOCCUS AUREUS  Final      Susceptibility   Staphylococcus aureus - MIC*    CLINDAMYCIN <=0.25 SENSITIVE Sensitive     ERYTHROMYCIN <=0.25 SENSITIVE Sensitive     GENTAMICIN <=0.5 SENSITIVE Sensitive     LEVOFLOXACIN 4 INTERMEDIATE Intermediate     OXACILLIN 0.5 SENSITIVE Sensitive     PENICILLIN 0.12 SENSITIVE Sensitive     RIFAMPIN 2 INTERMEDIATE Intermediate     TRIMETH/SULFA <=10 SENSITIVE Sensitive     VANCOMYCIN 1 SENSITIVE Sensitive     TETRACYCLINE <=1 SENSITIVE Sensitive     MOXIFLOXACIN 2 RESISTANT Resistant     * STAPHYLOCOCCUS AUREUS  Culture, blood (routine x 2)     Status: None   Collection Time: 01/10/15 11:27 AM  Result Value Ref Range Status   Specimen Description BLOOD LEFT ANTECUBITAL  Final   Special Requests BOTTLES DRAWN AEROBIC ONLY [place]  Final   Culture   Final    STAPHYLOCOCCUS AUREUS Note: SUSCEPTIBILITIES PERFORMED ON PREVIOUS CULTURE WITHIN THE LAST 5 DAYS. Note: Gram Stain Report Called to,Read Back By and Verified With: NURSE MONICA YIM 01/11/15 6:05AM THOMI Performed at [facility]    Report Status 01/13/2015 FINAL  Final  Culture, blood (routine x 2)     Status: None (Preliminary result)   Collection Time: 01/13/15  7:20 PM  Result Value Ref Range Status   Specimen  Description BLOOD RIGHT ARM  Final   Special Requests BOTTLES DRAWN AEROBIC AND ANAEROBIC 5CC  Final   Culture   Final           BLOOD CULTURE RECEIVED NO GROWTH TO DATE CULTURE WILL BE HELD FOR 5 DAYS BEFORE ISSUING A FINAL NEGATIVE REPORT Performed at [facility]    Report Status PENDING  Incomplete  Culture, blood  (routine x 2)     Status: None (Preliminary result)   Collection Time: 01/13/15  7:20 PM  Result Value Ref Range Status   Specimen Description BLOOD RIGHT ARM  Final   Special Requests BOTTLES DRAWN AEROBIC AND ANAEROBIC 3CC  Final   Culture   Final           BLOOD CULTURE RECEIVED NO GROWTH TO DATE CULTURE WILL BE HELD FOR 5 DAYS BEFORE ISSUING A FINAL NEGATIVE REPORT Performed at [facility]    Report Status PENDING  Incomplete  Body fluid culture     Status: None (Preliminary result)   Collection Time: 01/14/15  9:02 AM  Result Value Ref Range Status   Specimen Description FLUID RIGHT SHOULDER SYNOVIAL  Final   Special Requests NONE  Final   Gram Stain   Final    FEW WBC PRESENT,BOTH PMN AND MONONUCLEAR NO ORGANISMS SEEN Performed at [facility]    Culture   Final    NO GROWTH 1 DAY Performed at [facility]    Report Status PENDING  Incomplete    Studies/Results: Dg Orthopantogram  01/14/2015   CLINICAL DATA:  Staphylococcal endocarditis.  Poor dentition.  EXAM: ORTHOPANTOGRAM/PANORAMIC  COMPARISON:  None.  FINDINGS: Extensive dental disease with caries involving multiple teeth, including numbers 7, 8, 9, 10, 11, 12, 13, 14, 15, 16, 19, 20, 29, 30, 31 and 32. Periapical lucencies are present adjacent to teeth numbers 29 and 30.  IMPRESSION: Extensive dental disease as described.   Electronically Signed   By: Evangeline Dakin M.D.   On: 01/14/2015 21:31   Dg Chest 2 View  01/14/2015   CLINICAL DATA:  Right shoulder pain for 6 days.  EXAM: CHEST  2 VIEW  COMPARISON:  01/10/2015  FINDINGS: Two views of the chest demonstrate low lung volumes with bibasilar chest densities, left side greater than right. In addition, the heart appears to be enlarged. The configuration of the heart raises the possibility for pericardial fluid. No evidence for pulmonary edema. Difficult to exclude small effusions, particularly on the left side.  IMPRESSION: Low lung volumes with  bibasilar densities, left side greater than right. Basilar densities could represent a combination of atelectasis and pleural fluid.  Heart appears enlarged which may be accentuated by low lung volumes. Pericardial fluid cannot be excluded.   Electronically Signed   By: Markus Daft M.D.   On: 01/14/2015 08:17   Mr Brain Wo Contrast  01/14/2015   CLINICAL DATA:  35 year old male with history of staph aureus bacteremia and endocarditis.  EXAM: MRI HEAD WITHOUT CONTRAST  TECHNIQUE: Multiplanar, multiecho pulse sequences of the brain and surrounding structures were obtained without intravenous contrast.  COMPARISON:  Prior study from 04/25/2012  FINDINGS: Cerebral volume within normal limits for patient age. No significant chronic small vessel ischemic changes identified. No mass lesion, midline shift, or mass effect. Ventricles are normal in size without evidence of hydrocephalus. No extra-axial fluid collection.  There are multiple subcentimeter ischemic infarcts involving predominantly the supratentorial brain. These involve both frontal lobes, the right  parietal lobe, left temporal lobe, ischemic infarct within the right is cerebral hemisphere is located in the anterior right frontal lobe and measures 5 mm (series 3, image 36). The largest infarct in the left cerebral hemisphere is located in the deep white matter of the anterior left frontal lobe and measures 13 mm (series 3, image 30). There are infarcts involving the bilateral basal ganglia, the most prominent of which located in the right thalamus which measures 4 mm. There is a single infratentorial infarct within the left cerebellar hemisphere measuring 4 mm. These are most likely embolic in nature given the history of endocarditis and multiple vascular distributions. Scattered susceptibility artifact seen associated with a few of these infarcts, likely reflecting small petechial hemorrhages. No frank hemorrhagic conversion There is associated T2/FLAIR  signal intensity without significant mass effect.  Craniocervical junction within normal limits. Pituitary gland normal.  No acute abnormality seen about the orbits.  Paranasal sinuses are clear.  No mastoid effusion.  Bone marrow signal intensity within normal limits. Visualized upper cervical spine unremarkable.  Scalp soft tissues within normal limits.  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 These are likely embolic in nature given the history of endocarditis and involvement of multiple vascular distributions. No significant mass effect. There are likely scattered small petechial hemorrhages with a number these infarcts without frank hemorrhage.   Electronically Signed   By: Jeannine Boga M.D.   On: 01/14/2015 21:24   Mr Tibia Fibula Right Wo Contrast  01/15/2015   CLINICAL DATA:  Diabetic patient with bacteremia and right leg pain.  EXAM: MRI OF LOWER RIGHT EXTREMITY WITHOUT CONTRAST  TECHNIQUE: Multiplanar, multisequence MR imaging of the right lower leg was performed. No intravenous contrast was administered.  COMPARISON:  None.  FINDINGS: The axial and coronal sequences incidentally include the left lower leg. There is marked subcutaneous edema of both lower legs which appears slightly worse on the left. No focal subcutaneous fluid collection is identified. Mildly increased T2 signal is seen in the gastrocnemius, soleus and and tibialis posterior muscles bilaterally which appears symmetric. No intramuscular fluid collection is identified. No bone marrow signal abnormality to suggest osteomyelitis is seen. There is no muscle or tendon tear.  IMPRESSION: Intense bilateral lower leg subcutaneous edema appears worse on the left and could be due to dependent change or cellulitis.  Mildly increased T2 signal in the gastrocnemius, soleus and tibialis posterior muscles bilaterally could be due to myositis or early changes of denervation  atrophy in this diabetic patient.  Negative for abscess or osteomyelitis.   Electronically Signed   By: Inge Rise M.D.   On: 01/15/2015 08:28   Dg Fluoro Guide Ndl Plc/bx  01/14/2015   CLINICAL DATA:  Right shoulder pain and effusion.  Bacteremia.  EXAM: RIGHT SHOULDER ASPIRATION UNDER FLUOROSCOPY  FLUOROSCOPY TIME:  0 min 24 seconds  PROCEDURE: Overlying skin prepped with Betadine, draped in the usual sterile fashion, and infiltrated locally with buffered Lidocaine. Twenty gauge spinal needle was placed in the right glenohumeral joint under direct fluoroscopic visualization.  2 cc of bloody cloudy fluid was aspirated from the joint. Mr. Steel tolerated the procedure well.  IMPRESSION: Technically successful right shoulder aspiration under fluoroscopy. Fluid sent for laboratory analysis.   Electronically Signed   By: Rozetta Nunnery M.D.   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 Problems:   Tobacco abuse   Noncompliance  Dehydration   Diarrhea   Diabetes type 2, uncontrolled   Hyperglycemia   Uncontrolled hypertension   Diabetic foot ulcers   Pain   Diabetic ulcer of left foot associated with type 2 diabetes mellitus   Diabetic ulcer of right foot associated with type 2 diabetes mellitus   Osteomyelitis   Shoulder pain   Thigh pain, musculoskeletal   Septic shock   Staphylococcus aureus bacteremia with sepsis   Smoker   Elevated troponin   Acute renal failure   Bacteremia   Septic joint of right shoulder region   Infective myositis of left thigh   Purulent pericarditis   Mitral regurgitation   Endocarditis    Dwayne [name] is a 35 y.o. 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, PERFORATION, SEVERE MR,  POSSIBLE PURULENT PERICARDITIS with SEPTIC EMBOLI TO CNS,  shock renal failure positive troponins pericardial effusion RIGHT SEPTIC SHOULDER, left vastus  medialis myositis by MRI  #1      [facility] [place] [facility] Staphylococcus aureus bacteremia   Staphylococcus aureus bacteremia (SAB) is associated with a high rate of complications and mortality.  Specific aspects of clinical management are critical to optimizing the outcome of patients with SAB.  Therefore, the [facility] Antimicrobial Management Team [facility]) has initiated an intervention aimed at improving the management of SAB at [facility]  To do so, Infectious Diseases physicians are providing an evidence-based consult for the management of all patients with SAB.     Yes No Comments  Perform follow-up blood cultures (even if the patient is afebrile) to ensure clearance of bacteremia []  []   We NEED REPEAT BLOOD CULTURES and they have been drawn yesterday  Remove vascular catheter and obtain follow-up blood cultures after the removal of the catheter []  []   do not place central access until we have cleared his bacteremia   Perform echocardiography to evaluate for endocarditis (transthoracic ECHO is 40-50% sensitive, TEE is > 90% sensitive) [x]  []  TEE SHOWS infectious aneurysm and perforation of the base of the medial scallop of the posterior mitral leaflet. There is an associated 4x6 mm vegetation. There is severe mitral insufficiency through the perforation in the posterior leaflet with reversal of flow in both the left and right pulmonary veins         Ensure source control []  []  56k wbc with 94% PMN on shoulder aspirate after SIX days of antibiotics, no crystals = SEPTIC SHOULDER  Greatly appreciate Dr. Trevor Mace help with this case and agree on being careful with re to operative risk  I AM NOT confident that we will control shoulder infection with apirations and abx alone  With re to his legs strange that his exam findings are medial and MRI lateral  I would recommend getting MRI of BOTH THIGHS one week after last scan  He has been seen also by Dr. Roxy Manns  and Dr. Scot Dock from [facility] and GREATLY appreciate their help in addition to Dr. Ninfa Linden  I   Investigate for "metastatic" sites of infection []  []  MRI of brain has shown septic emboli with back pain he needs MRI T and L spine  Change antibiotic therapy to  NAFCILLIN SINCE ANCEF DOES NOT PENETRATE CNS AND HE HAS SEPTIC EMBOLI  []  []  Beta-lactam antibiotics are preferred for MSSA due to higher cure rates.   If on Vancomycin, goal trough should be 15 - 20 mcg/mL  Estimated duration of IV antibiotic therapy:     8 WEEKS POST HEART  SURGERY ALL OTHER POTENTIAL SURGERIES  []  []  Consult case management for probably prolonged outpatient IV antibiotic therapy       LOS: 6 days   Alcide Evener 01/15/2015, 12:57 PM

## 2015-01-15 NOTE — Progress Notes (Signed)
Patient ID: Dwayne HickmanBenjamin [name], male   DOB: 05/27/1980, 35 y.o.   MRN: [id number] Patient more alert this am.  Says his right shoulder does not hurt too bad.  I am able to rotate and abduct his shoulder as well as compress his shoulder joint with minimal discomfort and he can hold his right arm above his head.  The aspiration of the small amount of fluid in the joint likely help some.  His IV antibiotics are also helping.  It is difficult making a decision to take him to the OR for a shoulder washout given his overwhelming heart/valve issues.  Certainly a "simple" procedure can still cause significant issues from an anesthesia standpoint due to the need for general anesthesia and the fine line of keeping minimal fluid balance during surgery given his CHF.  Also, with a significantly improved clinical exam, he does not wants to be taken to the OR for surgery right now.  I'll continue to follow him closely along looking for improvements in his health.  Right now, his shoulder is stable, and his other more major health issues are more important to address.  He still could end up with right shoulder arthritis due to the possibility of a nidus of infection and this is something I'll still continue to follow closely.

## 2015-01-15 NOTE — Progress Notes (Addendum)
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 Problems:   Tobacco abuse   Noncompliance   Dehydration   Diarrhea   Diabetes type 2, uncontrolled   Hyperglycemia   Uncontrolled hypertension   Diabetic foot ulcers   Pain   Diabetic ulcer of left foot associated with type 2 diabetes mellitus   Diabetic ulcer of right foot associated with type 2 diabetes mellitus   Osteomyelitis   Shoulder pain   Thigh pain, musculoskeletal   Septic shock   Staphylococcus aureus bacteremia with sepsis   Smoker   Elevated troponin   Acute renal failure   Bacteremia   Septic joint of right shoulder region   Infective myositis of left thigh   Purulent pericarditis   Mitral regurgitation   Endocarditis    SUBJECTIVE  Mild shoulder pain.    CURRENT MEDS . chlorhexidine  15 mL Mouth/Throat BID  . feeding supplement (GLUCERNA SHAKE)  237 mL Oral BID BM  . feeding supplement (PRO-STAT SUGAR FREE 64)  30 mL Oral Q1500  . Influenza vac split quadrivalent PF  0.5 mL Intramuscular Tomorrow-1000  . insulin aspart  0-15 Units Subcutaneous TID WC  . insulin aspart  0-5 Units Subcutaneous QHS  . insulin aspart protamine- aspart  20 Units Subcutaneous BID WC  . living well with diabetes book   Does not apply Once  . metoprolol tartrate  25 mg Oral BID  . nafcillin  2 g Intravenous Q4H  . neomycin-bacitracin-polymyxin   Topical Daily  . nicotine  7 mg Transdermal Daily  . sodium chloride  500 mL Intravenous Once    OBJECTIVE  Filed Vitals:   01/14/15 2115 01/15/15 0148 01/15/15 0454 01/15/15 0945  BP: 151/81 151/90 157/90 157/84  Pulse: 95 92 93 94  Temp: 98.2 F (36.8 C) 98.4 F (36.9 C) 98.1 F (36.7 C)   TempSrc: Oral Oral Oral   Resp: Height:      Weight:      SpO2: 99% 97% 95%     Intake/Output Summary (Last 24 hours) at 01/15/15 1000 Last data filed at 01/15/15 0901  Gross per 24  hour  Intake    720 ml  Output   2160 ml  Net  -1440 ml   Filed Weights   01/09/15 0017 01/09/15 0329  Weight: 220 lb (99.791 kg) 226 lb 6.4 oz (102.694 kg)    PHYSICAL EXAM  General: Pleasant, NAD. Neuro: Alert and oriented X 3. Moves all extremities spontaneously. Psych: Normal affect. HEENT:  Normal  Neck: Supple without bruits or JVD. Lungs:  Resp regular and unlabored, anterior exam CTA. Heart: RRR no s3, s4, soft HSM apex. +RUB  Abdomen: Soft, non-tender, non-distended, BS + x 4.  Extremities: No clubbing, cyanosis or edema. LLE tender  Accessory Clinical Findings  CBC  Recent Labs  01/13/15 0503 01/14/15 0518  WBC 13.4* 10.8*  HGB 10.6* 9.9*  HCT 31.4* 29.1*  MCV 82.4 83.1  PLT 243 264   Basic Metabolic Panel  Recent Labs  01/13/15 0503 01/14/15 0518  NA 134* 131*  K 3.1* 3.0*  CL 101 102  CO2 22 23  GLUCOSE 123* 241*  BUN 37* 33*  CREATININE 3.05* 2.66*  CALCIUM 8.0* 7.7*  MG 2.3  --    Liver Function Tests  Recent Labs  01/13/15 0503 01/14/15 0518  AST 23 23  ALT 21 14  ALKPHOS 151* 211*  BILITOT 0.2* 0.3  PROT 5.7* 5.6*  ALBUMIN 1.4* 1.3*   Cardiac Enzymes  Recent Labs  01/12/15 1130 01/12/15 1849  TROPONINI 0.65* 0.47*   Hemoglobin A1C  Recent Labs  01/14/15 0518  HGBA1C 8.4*    TELE NSR with HR 80-90s    ECG  No new EKG  TEE 01/13/2015  LV EF: 60% -  65%  ------------------------------------------------------------------- Study Conclusions  - Left ventricle: The cavity size was normal. There was moderate concentric hypertrophy. Systolic function was normal. The estimated ejection fraction was in the range of 60% to 65%. Wall motion was normal; there were no regional wall motion abnormalities. - Aortic valve: No evidence of vegetation. 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 There is an associated 4x6  mm vegetation. There is severe mitral insufficiency through the perforation in the posterior leaflet with reversal of flow in both the left and right pulmonary veins. - Left atrium: The atrium was mildly dilated. No evidence of thrombus in the atrial cavity or appendage. No spontaneous echo contrast was observed. - Right atrium: No evidence of thrombus in the atrial cavity or appendage. No evidence of thrombus in the atrial cavity or appendage. - Atrial septum: No defect or patent foramen ovale was identified. - Tricuspid valve: No evidence of vegetation. - Pulmonic valve: No evidence of vegetation. - Pericardium, extracardiac: A small pericardial effusion was identified circumferential to the heart. The fluid exhibited a fibrinous appearance.There was no evidence of hemodynamic compromise.     Radiology/Studies  Dg Orthopantogram  01/14/2015   CLINICAL DATA:  Staphylococcal endocarditis.  Poor dentition.  EXAM: ORTHOPANTOGRAM/PANORAMIC  COMPARISON:  None.  FINDINGS: Extensive dental disease with caries involving multiple teeth, including numbers 7, 8, 9, 10, 11, 12, 13, 14, 15, 16, 19, 20, 29, 30, 31 and 32. Periapical lucencies are present adjacent to teeth numbers 29 and 30.  IMPRESSION: Extensive dental disease as described.   Electronically Signed   By: Hulan Saas M.D.   On: 01/14/2015 21:31   Dg Chest 2 View  01/14/2015   CLINICAL DATA:  Right shoulder pain for 6 days.  EXAM: CHEST  2 VIEW  COMPARISON:  01/10/2015  FINDINGS: Two views of the chest demonstrate low lung volumes with bibasilar chest densities, left side greater than right. In addition, the heart appears to be enlarged. The configuration of the heart raises the possibility for pericardial fluid. No evidence for pulmonary edema. Difficult to exclude small effusions, particularly on the left side.  IMPRESSION: Low lung volumes with bibasilar densities, left side greater than right. Basilar densities  could represent a combination of atelectasis and pleural fluid.  Heart appears enlarged which may be accentuated by low lung volumes. Pericardial fluid cannot be excluded.   Electronically Signed   By: Richarda Overlie M.D.   On: 01/14/2015 08:17   Dg Shoulder Right  01/09/2015   CLINICAL DATA:  Anterior right shoulder pain  EXAM: RIGHT SHOULDER - 2+ VIEW  COMPARISON:  None.  FINDINGS: No fracture or dislocation is seen.  The joint spaces are preserved.  The visualized soft tissues are unremarkable.  Visualized right lung is clear.  IMPRESSION: No fracture or dislocation is seen.   Electronically Signed   By: Charline Bills M.D.   On: 01/09/2015 09:44   Mr Brain Wo Contrast  01/14/2015   CLINICAL DATA:  35 year old male with history of  staph aureus bacteremia and endocarditis.  EXAM: MRI HEAD WITHOUT CONTRAST  TECHNIQUE: Multiplanar, multiecho pulse sequences of the brain and surrounding structures were obtained without intravenous contrast.  COMPARISON:  Prior study from 04/25/2012  FINDINGS: Cerebral volume within normal limits for patient age. No significant chronic small vessel ischemic changes identified. No mass lesion, midline shift, or mass effect. Ventricles are normal in size without evidence of hydrocephalus. No extra-axial fluid collection.  There are multiple subcentimeter ischemic infarcts involving predominantly the supratentorial brain. These involve both frontal lobes, the right parietal lobe, left temporal lobe, ischemic infarct within the right is cerebral hemisphere is located in the anterior right frontal lobe and measures 5 mm (series 3, image 36). The largest infarct in the left cerebral hemisphere is located in the deep white matter of the anterior left frontal lobe and measures 13 mm (series 3, image 30). There are infarcts involving the bilateral basal ganglia, the most prominent of which located in the right thalamus which measures 4 mm. There is a single infratentorial infarct within  the left cerebellar hemisphere measuring 4 mm. These are most likely embolic in nature given the history of endocarditis and multiple vascular distributions. Scattered susceptibility artifact seen associated with a few of these infarcts, likely reflecting small petechial hemorrhages. No frank hemorrhagic conversion There is associated T2/FLAIR signal intensity without significant mass effect.  Craniocervical junction within normal limits. Pituitary gland normal.  No acute abnormality seen about the orbits.  Paranasal sinuses are clear.  No mastoid effusion.  Bone marrow signal intensity within normal limits. Visualized upper cervical spine unremarkable.  Scalp soft tissues within normal limits.  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 These are likely embolic in nature given the history of endocarditis and involvement of multiple vascular distributions. No significant mass effect. There are likely scattered small petechial hemorrhages with a number these infarcts without frank hemorrhage.   Electronically Signed   By: Rise Mu M.D.   On: 01/14/2015 21:24   US Renal  01/12/2015   CLINICAL DATA:  Acute renal failure  EXAM: RENAL/URINARY TRACT ULTRASOUND COMPLETE  COMPARISON:  None.  FINDINGS: Right Kidney:  Length: 12.8 cm in length. Diffuse increased cortical echogenicity suspicious for medical renal disease. No hydronephrosis. No renal calculus.  Left Kidney:  Length: . 12.6 cm in length. Diffuse increased echogenicity suspicious for medical renal disease. No hydronephrosis. No diagnostic renal calculus.  Bladder:  Appears normal for degree of bladder distention.  IMPRESSION: Bilateral kidneys shows diffuse increased echogenicity. Medical renal disease cannot be excluded. No hydronephrosis or diagnostic renal calculus.   Electronically Signed   By: Natasha Mead M.D.   On: 01/12/2015 16:08   Mr Shoulder Right Wo Contrast  01/11/2015    CLINICAL DATA:  Right shoulder pain.  EXAM: MRI OF THE RIGHT SHOULDER WITHOUT CONTRAST  TECHNIQUE: Multiplanar, multisequence MR imaging of the shoulder was performed. No intravenous contrast was administered.  COMPARISON:  Radiographs dated 01/09/2015  FINDINGS: Rotator cuff:  Normal.  Muscles:  Normal.  Biceps long head: Properly located and intact. There is inflammation in the soft tissues around the bicipital tendon sheath with fluid in the bicipital tendon sheath.  Acromioclavicular Joint: Small degenerative cyst in the acromion. Otherwise normal AC joint. Type 1 acromion. No bursitis.  Glenohumeral Joint: There is a moderate glenohumeral joint effusion with extravasation of contrast from the subcoracoid recess of the joint into the adjacent soft tissues. Fluid distends the bicipital tendon sheath.  Labrum:  Intact.  Bones: No significant abnormalities. Tiny degenerative cystic areas in the posterior aspect of the greater tuberosity of the proximal humerus.  IMPRESSION: 1. Moderate glenohumeral joint effusion with some extravasation of joint. Fluid distends the bicipital tendon sheath with inflammation/extravasated joint fluid around the bicipital tendon sheath. The possibly of synovitis should be considered. 2. Normal rotator cuff.   Electronically Signed   By: Geanie Cooley M.D.   On: 01/11/2015 11:21   Mr Tibia Fibula Right Wo Contrast  01/15/2015   CLINICAL DATA:  Diabetic patient with bacteremia and right leg pain.  EXAM: MRI OF LOWER RIGHT EXTREMITY WITHOUT CONTRAST  TECHNIQUE: Multiplanar, multisequence MR imaging of the right lower leg was performed. No intravenous contrast was administered.  COMPARISON:  None.  FINDINGS: The axial and coronal sequences incidentally include the left lower leg. There is marked subcutaneous edema of both lower legs which appears slightly worse on the left. No focal subcutaneous fluid collection is identified. Mildly increased T2 signal is seen in the gastrocnemius,  soleus and and tibialis posterior muscles bilaterally which appears symmetric. No intramuscular fluid collection is identified. No bone marrow signal abnormality to suggest osteomyelitis is seen. There is no muscle or tendon tear.  IMPRESSION: Intense bilateral lower leg subcutaneous edema appears worse on the left and could be due to dependent change or cellulitis.  Mildly increased T2 signal in the gastrocnemius, soleus and tibialis posterior muscles bilaterally could be due to myositis or early changes of denervation atrophy in this diabetic patient.  Negative for abscess or osteomyelitis.   Electronically Signed   By: Drusilla Kanner M.D.   On: 01/15/2015 08:28   Mr Foot Right Wo Contrast  01/11/2015   CLINICAL DATA:  Diabetic foot ulcers.  EXAM: MRI OF THE RIGHT FOREFOOT WITHOUT CONTRAST  TECHNIQUE: Multiplanar, multisequence MR imaging was performed. No intravenous contrast was administered.  COMPARISON:  Radiographs dated 01/09/2011  FINDINGS: There are small erosions of the proximal phalanx at the IP joint of the great toe and there are small erosions at the first metatarsal phalangeal joint, both consistent with gout. There are no soft tissue abscesses. There is no osteomyelitis. There is nonspecific edema in the subcutaneous fat of the dorsum of the foot. Muscles and tendons and ligaments appear appear normal. No joint effusions.  IMPRESSION: Subcutaneous edema primarily on the dorsum of the foot. 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   Electronically Signed   By: Geanie Cooley M.D.   On: 01/11/2015 12:50   Mr Femur Left Wo Contrast  01/12/2015   CLINICAL DATA:  Diabetes. Peripheral neuropathy. Sepsis. Bilateral thigh pain. RIGHT thigh pain. Tenderness in the medial aspect of the thigh.  EXAM: MR OF THE LEFT FEMUR WITHOUT CONTRAST  TECHNIQUE: Multiplanar, multisequence MR imaging was performed. No intravenous contrast was administered.  COMPARISON:  None.  FINDINGS: Diffuse subcutaneous edema is present  around the pelvis and in both thighs, most compatible with anasarca. This could also represent cellulitis in the appropriate clinical setting. Subcutaneous fluid is present in the lateral LEFT thigh, without a discrete abscess. There is myositis of the LEFT vastus lateralis with mild muscular edema av but no significant swelling. No fatty atrophy.  Bone marrow signal shows heterogenous marrow. This is a nonspecific finding most commonly associated with obesity, anemia, cigarette smoking or chronic disease. Negative for hip fracture or AVN. There is no evidence of osteomyelitis. No deep soft tissue abscesses are present.  Bilaterally in the groin, there are  prominent lymph nodes which are probably reactive to lower extremity process.  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  IMPRESSION: 1. Diffuse subcutaneous edema in the pelvis and thighs bilaterally, suggesting anasarca. 2. More prominent subcutaneous edema along the LEFT thigh with subcutaneous fluid superficial to the muscular fascia, likely representing cellulitis. No abscess. 3. Myositis of the proximal vastus lateralis, probably infectious. Reactive edema could produce this appearance as well. 4. Reactive inguinal adenopathy bilaterally. 5. Negative for osteomyelitis.   Electronically Signed   By: Andreas Newport M.D.   On: 01/12/2015 14:33   Mr Foot Left Wo Contrast  01/11/2015   CLINICAL DATA:  Diabetic foot ulcers.  Foot pain.  Bony erosions.  EXAM: MRI OF THE LEFT FOREFOOT WITHOUT CONTRAST  TECHNIQUE: Multiplanar, multisequence MR imaging was performed. No intravenous contrast was administered.  COMPARISON:  Radiograph dated 01/09/2015  FINDINGS: There is slight edema throughout the medial cuneiform. There is artifactual increased signal from the metatarsal heads in from the proximal phalanx of the great toe. There are erosions at IP joint and first metatarsal phalangeal joint  consistent with gout.  There are no soft tissue abscesses and there is no osteomyelitis. There is prominent edema in the subcutaneous fat of the dorsum of the foot.  IMPRESSION: No evidence of osteomyelitis or abscess. The edema in the medial cuneiform is most likely a stress reaction. 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   Electronically Signed   By: Geanie Cooley M.D.   On: 01/11/2015 12:15   Dg Chest Port 1 View  01/10/2015   CLINICAL DATA:  Fever and sweats.  EXAM: PORTABLE CHEST - 1 VIEW  COMPARISON:  None.  FINDINGS: Low lung volumes with vascular crowding and streaky subsegmental atelectasis. No obvious infiltrate or effusion. The heart is within normal limits in size given the AP portable technique. The bony structures are grossly normal.  IMPRESSION: Low lung volumes with vascular crowding and bibasilar atelectasis.   Electronically Signed   By: Loralie Champagne M.D.   On: 01/10/2015 11:27   Dg Foot Complete Left  01/09/2015   CLINICAL DATA:  35 year old male with recurrent bilateral foot pain.  EXAM: LEFT FOOT - COMPLETE 3+ VIEW  COMPARISON:  01/05/2014.  FINDINGS: Large periarticular erosions are noted adjacent to the first MTP joint, and at the first interphalangeal joint along the medial margin in both locations. These have erosions have sclerotic margins and overhanging edges, and there is adjacent soft tissue prominence, suggestive of gouty erosions. No adjacent soft tissue calcification is noted. No acute displaced fracture, subluxation or dislocation. Small heterotopic ossification adjacent to the plantar aspect of the calcaneus, likely ossification within the plantar fascia.  IMPRESSION: 1. Findings, as above, suggestive of underlying gout. Clinical correlation is recommended.   Electronically Signed   By: Trudie Reed M.D.   On: 01/09/2015 09:46   Dg Foot Complete Right  01/09/2015   CLINICAL DATA:  Recurring bilateral foot pain  EXAM: RIGHT FOOT  COMPLETE - 3+ VIEW  COMPARISON:  None.  FINDINGS: No fracture or dislocation is seen.  Marginal erosion at the medial aspect of the 1st IP joint, involving the proximal phalanx.  The visualized soft tissues are unremarkable.  IMPRESSION: Marginal erosion at the medial aspect of the 1st IP joint. Correlate for inflammatory arthropathy such as gout.  Otherwise, no acute osseus abnormality is seen.   Electronically Signed  By: Charline BillsSriyesh  Krishnan M.D.   On: 01/09/2015 09:44   Dg Fluoro Guide Ndl Plc/bx  01/14/2015   CLINICAL DATA:  Right shoulder pain and effusion.  Bacteremia.  EXAM: RIGHT SHOULDER ASPIRATION UNDER FLUOROSCOPY  FLUOROSCOPY TIME:  0 min 24 seconds  PROCEDURE: Overlying skin prepped with Betadine, draped in the usual sterile fashion, and infiltrated locally with buffered Lidocaine. Twenty gauge spinal needle was placed in the right glenohumeral joint under direct fluoroscopic visualization.  2 cc of bloody cloudy fluid was aspirated from the joint. [name] tolerated the procedure well.  IMPRESSION: Technically successful right shoulder aspiration under fluoroscopy. Fluid sent for laboratory analysis.   Electronically Signed   By: Geanie CooleyJim  Maxwell M.D.   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     1. Elevated trop: in the setting of endocarditis  2. 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 Barry Dieneswens with CT surgery, discussed with Dr. Barry Dieneswens,  would prefer the bacteremia to clear up first, however he concerned about the degree his MR worsened in 2 days between TTE and TEE. May need earlier surgery his start to have HF symptom. Also I am concerned about septic  emboli.   - Pericarditis, small effusion. Discussed.  - per Dr. Cornelius Moraswen, ideally need cardiac cath, LE angiography to r/o septic emboli to LLE arterial system, however understandably his renal function is prohibitive of such test now. He may also need dental eval prior to surgery. Dr. Cornelius Moraswen concern recent AKI may be related to septic emboli as well, nephrology on board.    - if shoulder surgery is necessary moderate cardiac risk (based on severe MR and underlying endocarditis) for shoulder debridement, and may proceed if benefit felt to outweigh risk . Careful with fluid administration. No current signs of CHF. EF normal.   3. staph bacteremia, MSSA- ID 4. PVD, LE ABI 0.64. Vascular evaluated. Monitor.  5. Acute on chronic renal insufficiency: improving, ?septic emboli 6. Hypoalbuminemia 7. Hypokalemia  Highly concerned about his situation, discussed with patient.   Donato SchultzSKAINS, Kie Calvin, MD

## 2015-01-15 NOTE — Progress Notes (Signed)
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 Problems:   Tobacco abuse   Noncompliance   Dehydration   Diarrhea   Diabetes type 2, uncontrolled   Hyperglycemia   Uncontrolled hypertension   Diabetic foot ulcers   Pain   Diabetic ulcer of left foot associated with type 2 diabetes mellitus   Diabetic ulcer of right foot associated with type 2 diabetes mellitus   Osteomyelitis   Shoulder pain   Thigh pain, musculoskeletal   Septic shock   Staphylococcus aureus bacteremia with sepsis   Smoker   Elevated troponin   Acute renal failure   Bacteremia   Septic joint of right shoulder region   Infective myositis of left thigh   Purulent pericarditis   Mitral regurgitation   Endocarditis     Mssa bacteremia with septic shock, renal failure, + troponins, endocarditis with significant mitral regurgitation, infected shoulder, myositis of the thigh, decreased ABI on the left Initially on broad-spectrum antibiotics vancomycin and Zosyn 1/12 Now switched to daptomycin plus Ancef?   infectious disease Dr. Drucilla Schmidt recommends 8 weeks of antibiotics post heart surgery and all other potential surgeries  influenza PCR negative  respiratory panel negative UA negative MRI both feet as per infectious disease recommendations No evidence of osteomyelitis on bilateral MRI of the feet MRI of the right shoulder, shows effusion with possible synovitis Appreciate orthopedic consultation, consult IR to aspirate the right shoulder MRI left thigh -Myositis of the proximal vastus lateralis, probably infectious. Right shoulder aspiration shows a white count of 56,000, according to infectious disease this is significant for septic joint, and patient needs a joint washout conveyed this to Dr. Ninfa Linden. The patient will need cardiology clearance prior to  his joint washout, as per Dr. Marlou Porch is a moderate risk MRI of the right thigh is pending, Mildly increased T2 signal in the gastrocnemius, soleus and tibialis posterior muscles bilaterally could be due to myositis  MRI of the brainMulti focal ischemic predominately subcentimeter and supratentorial ischemic infarcts as above. 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 Ricard Dillon with CT surgery, discussed with Dr. Ricard Dillon, would prefer the bacteremia to clear up first, however he concerned about the degree his MR worsened in 2 days between TTE and TEE. May need earlier surgery his start to have HF symptom. - per Dr. Roxy Manns, ideally need cardiac cath, LE angiography to r/o septic emboli to LLE arterial system, however understandably his renal function is prohibitive of such test now. He may also need dental eval prior to surgery. Dr. Roxy Manns concern recent AKI may be related to septic emboli as well, nephrology on board.   - if shoulder surgery is necessary moderate cardiac risk (based on severe MR and underlying endocarditis) for shoulder debridement, and may proceed if benefit felt to outweigh risk . Careful with fluid administration. No current signs of CHF. EF normal.     Diarrhea C. difficile negative   Poorly controlled diabetes Hemoglobin A1c 8.1 Patient admits to being noncompliant with medications Continue current insulin regimen Diabetes coordinator consult   Hypertension Continue Lopressor BID, hydralazine as needed  Acute renal failure-prerenal vs vancomycin toxicity vs septic  emboli Likely prerenal with a baseline creatinine of 1.24, slight improvement today Appreciate nephrology input, nephrology  consulted-Dr.webb Renal ultrasound does not show any hydronephrosis Continue gentle IV hydration    Hypokalemia replete Check magnesium level   Code Status: full Family Communication: family updated about patient's clinical progress Disposition Plan:  As above    Brief narrative: Dwayne [name] is a 35 y.o. male with a history of Uncontrolled HTN, DM2 who presents to the ED with complaints of diarrhea and fevers and chills x 24 hours. He reports having weakness and fatigue and He denies any nausea or vomiting. No one is sick at home, and he denies taking any recent antibiotic therapy. He reports that he has been out of his medications for months, and has not seen his PCP in months as well. 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   Consultants:  None  Procedures:  None  Antibiotics: Flagyl discontinued < 1/11 Vancomycin and Zosyn > 1/11  HPI/Subjective: Patient tachycardic, hypertensive, febrile Diarrhea has improved, still complaining of nausea and vomiting Nausea improved , Denies significant right shoulder pain   Objective: Filed Vitals:   01/14/15 2115 01/15/15 0148 01/15/15 0454 01/15/15 0945  BP: 151/81 151/90 157/90 157/84  Pulse: 95 92 93 94  Temp: 98.2 F (36.8 C) 98.4 F (36.9 C) 98.1 F (36.7 C)   TempSrc: Oral Oral Oral   Resp: 18 18 18    Height:      Weight:      SpO2: 99% 97% 95%     Intake/Output Summary (Last 24 hours) at 01/15/15 1219 Last data filed at 01/15/15 0901  Gross per 24 hour  Intake    720 ml  Output   2160 ml  Net  -1440 ml    Exam:  General: alert & oriented x 3 In NAD  Cardiovascular: RRR, nl S1 s2  Respiratory: Decreased breath sounds at the bases, scattered rhonchi, no crackles  Abdomen: soft +BS NT/ND, no masses palpable  Extremities: No cyanosis and no edema      Data Reviewed: Basic Metabolic Panel:  Recent Labs Lab  01/09/15 0500 01/10/15 0508 01/10/15 1121 01/11/15 0235 01/11/15 0953 01/12/15 0614 01/13/15 0503 01/14/15 0518  NA 136 132*  --  131*  --  129* 134* 131*  K 3.0* 3.0*  --  3.6  --  3.0* 3.1* 3.0*  CL 105 103  --  100  --  98 101 102  CO2 23 23  --  21  --  21 22 23   GLUCOSE 253* 103*  --  172*  --  97 123* 241*  BUN 17 21  --  29*  --  34* 37* 33*  CREATININE 1.86* 2.15*  --  2.52*  --  3.15* 3.05* 2.66*  CALCIUM 7.7* 8.0*  --  8.0*  --  7.5* 8.0* 7.7*  MG 1.5  --  1.6  --  1.9  --  2.3  --     Liver Function Tests:  Recent Labs Lab 01/09/15 0021 01/11/15 0235 01/12/15 0614 01/13/15 0503 01/14/15 0518  AST 25 20 25 23 23   ALT 19 23 23 21 14   ALKPHOS 86 80 81 151* 211*  BILITOT 0.6 0.6 0.6 0.2* 0.3  PROT 6.0 6.3 5.0* 5.7* 5.6*  ALBUMIN 2.1* 1.7* 1.4* 1.4* 1.3*   No results for input(s): LIPASE, AMYLASE in the last 168 hours.  No results for input(s): AMMONIA in the last 168 hours.  CBC:  Recent Labs Lab 01/09/15 0021  01/10/15 0508 01/11/15 0235 01/12/15 0614 01/13/15 0503 01/14/15 0518  WBC 11.3*  < > 13.8* 15.3* 13.4* 13.4* 10.8*  NEUTROABS 9.3*  --   --   --   --   --   --   HGB 11.7*  < > 12.2* 12.1* 10.3* 10.6* 9.9*  HCT 34.7*  < > 35.1* 34.8* 30.8* 31.4* 29.1*  MCV 82.6  < > 81.6 81.3 82.1 82.4 83.1  PLT 179  < > 126* 153 169 243 264  < > = values in this interval not displayed.  Cardiac Enzymes:  Recent Labs Lab 01/10/15 2036 01/11/15 0235 01/12/15 0614 01/12/15 1130 01/12/15 1849  CKTOTAL  --   --  49  --   --   TROPONINI 0.33* 0.28* 0.84* 0.65* 0.47*   BNP (last 3 results) No results for input(s): PROBNP in the last 8760 hours.   CBG:  Recent Labs Lab 01/14/15 1632 01/14/15 2114 01/15/15 0022 01/15/15 0422 01/15/15 0747  GLUCAP 156* 192* 150* 179* 219*    Recent Results (from the past 240 hour(s))  Urine culture     Status: None   Collection Time: 01/09/15  1:31 AM  Result Value Ref Range Status   Specimen Description  URINE, CLEAN CATCH  Final   Special Requests NONE  Final   Colony Count   Final    3,000 COLONIES/ML Performed at [facility]    Culture   Final    INSIGNIFICANT GROWTH Performed at [facility]    Report Status 01/10/2015 FINAL  Final  Stool culture     Status: None   Collection Time: 01/09/15  4:09 AM  Result Value Ref Range Status   Specimen Description VAGINAL/RECTAL  Final   Special Requests Normal  Final   Culture   Final    NO SALMONELLA, SHIGELLA, CAMPYLOBACTER, YERSINIA, OR E.COLI 0157:H7 ISOLATED Performed at [facility]    Report Status 01/12/2015 FINAL  Final  Clostridium Difficile by PCR     Status: None   Collection Time: 01/09/15  4:09 AM  Result Value Ref Range Status   C difficile by pcr NEGATIVE NEGATIVE Final  Culture, blood (routine x 2)     Status: None   Collection Time: 01/10/15  4:35 AM  Result Value Ref Range Status   Specimen Description BLOOD RIGHT HAND  Final   Special Requests BOTTLES DRAWN AEROBIC AND ANAEROBIC 10CC EA  Final   Culture   Final    STAPHYLOCOCCUS AUREUS Note: SUSCEPTIBILITIES PERFORMED ON PREVIOUS CULTURE WITHIN THE LAST 5 DAYS. Note: Culture results may be compromised due to an excessive volume of blood received in culture bottles. Gram Stain Report Called to,Read Back By and Verified With:  MONICA YIM ON 01/10/2015 AT 11:36P BY WILEJ Performed at [facility]    Report Status 01/13/2015 FINAL  Final  Culture, blood (routine x 2)     Status: None   Collection Time: 01/10/15  4:46 AM  Result Value Ref Range Status   Specimen Description BLOOD LEFT ARM  Final   Special Requests BOTTLES DRAWN AEROBIC AND ANAEROBIC 10CC EA  Final   Culture   Final    STAPHYLOCOCCUS AUREUS Note: SUSCEPTIBILITIES PERFORMED ON PREVIOUS CULTURE WITHIN THE LAST 5 DAYS. Note: Culture results may be compromised due to an excessive volume of blood received in culture bottles. Gram Stain Report  Called to,Read Back By and  Verified With: MONICA YIM ON 01/10/2015 AT 11:36P BY WILEJ Performed at [facility]    Report Status 01/13/2015 FINAL  Final  Respiratory virus panel (routine influenza)     Status: None   Collection Time: 01/10/15 10:34 AM  Result Value Ref Range Status   Source - RVPAN NASAL SWAB  Corrected    Comment: CORRECTED ON 01/12 AT 2036: PREVIOUSLY REPORTED AS NASAL SWAB   Respiratory Syncytial Virus A NOT DETECTED  Final   Respiratory Syncytial Virus B NOT DETECTED  Final   Influenza A NOT DETECTED  Final   Influenza B NOT DETECTED  Final   Parainfluenza 1 NOT DETECTED  Final   Parainfluenza 2 NOT DETECTED  Final   Parainfluenza 3 NOT DETECTED  Final   Metapneumovirus NOT DETECTED  Final   Rhinovirus NOT DETECTED  Final   Adenovirus NOT DETECTED  Final   Influenza A H1 NOT DETECTED  Final   Influenza A H3 NOT DETECTED  Final    Comment: (NOTE)       Normal Reference Range for each Analyte: NOT DETECTED Testing performed using the Luminex xTAG Respiratory Viral Panel test kit. The analytical performance characteristics of this assay have been determined by [facility].  The modifications have not been cleared or approved by the FDA. This assay has been validated pursuant to the CLIA regulations and is used for clinical purposes. Performed at [facility], blood (routine x 2)     Status: None   Collection Time: 01/10/15 11:19 AM  Result Value Ref Range Status   Specimen Description BLOOD LEFT FOREARM  Final   Special Requests BOTTLES DRAWN AEROBIC ONLY 5CC  Final   Culture   Final    STAPHYLOCOCCUS AUREUS Note: RIFAMPIN AND GENTAMICIN SHOULD NOT BE USED AS SINGLE DRUGS FOR TREATMENT OF STAPH INFECTIONS. Note: Gram Stain Report Called to,Read Back By and Verified With: NURSE MONICA YIM 01/11/15 6:05AM THOMI Performed at [facility]    Report Status 01/13/2015 FINAL  Final   Organism ID, Bacteria STAPHYLOCOCCUS AUREUS  Final       Susceptibility   Staphylococcus aureus - MIC*    CLINDAMYCIN <=0.25 SENSITIVE Sensitive     ERYTHROMYCIN <=0.25 SENSITIVE Sensitive     GENTAMICIN <=0.5 SENSITIVE Sensitive     LEVOFLOXACIN 4 INTERMEDIATE Intermediate     OXACILLIN 0.5 SENSITIVE Sensitive     PENICILLIN 0.12 SENSITIVE Sensitive     RIFAMPIN 2 INTERMEDIATE Intermediate     TRIMETH/SULFA <=10 SENSITIVE Sensitive     VANCOMYCIN 1 SENSITIVE Sensitive     TETRACYCLINE <=1 SENSITIVE Sensitive     MOXIFLOXACIN 2 RESISTANT Resistant     * STAPHYLOCOCCUS AUREUS  Culture, blood (routine x 2)     Status: None   Collection Time: 01/10/15 11:27 AM  Result Value Ref Range Status   Specimen Description BLOOD LEFT ANTECUBITAL  Final   Special Requests BOTTLES DRAWN AEROBIC ONLY [place]  Final   Culture   Final    STAPHYLOCOCCUS AUREUS Note: SUSCEPTIBILITIES PERFORMED ON PREVIOUS CULTURE WITHIN THE LAST 5 DAYS. Note: Gram Stain Report Called to,Read Back By and Verified With: NURSE MONICA YIM 01/11/15 6:05AM THOMI Performed at [facility]    Report Status 01/13/2015 FINAL  Final  Culture, blood (routine x 2)     Status: None (Preliminary result)   Collection Time: 01/13/15  7:20 PM  Result Value Ref Range Status  Specimen Description BLOOD RIGHT ARM  Final   Special Requests BOTTLES DRAWN AEROBIC AND ANAEROBIC 5CC  Final   Culture   Final           BLOOD CULTURE RECEIVED NO GROWTH TO DATE CULTURE WILL BE HELD FOR 5 DAYS BEFORE ISSUING A FINAL NEGATIVE REPORT Performed at [facility]    Report Status PENDING  Incomplete  Culture, blood (routine x 2)     Status: None (Preliminary result)   Collection Time: 01/13/15  7:20 PM  Result Value Ref Range Status   Specimen Description BLOOD RIGHT ARM  Final   Special Requests BOTTLES DRAWN AEROBIC AND ANAEROBIC 3CC  Final   Culture   Final           BLOOD CULTURE RECEIVED NO GROWTH TO DATE CULTURE WILL BE HELD FOR 5 DAYS BEFORE ISSUING A FINAL NEGATIVE REPORT Performed  at [facility]    Report Status PENDING  Incomplete  Body fluid culture     Status: None (Preliminary result)   Collection Time: 01/14/15  9:02 AM  Result Value Ref Range Status   Specimen Description FLUID RIGHT SHOULDER SYNOVIAL  Final   Special Requests NONE  Final   Gram Stain   Final    FEW WBC PRESENT,BOTH PMN AND MONONUCLEAR NO ORGANISMS SEEN Performed at [facility]    Culture PENDING  Incomplete   Report Status PENDING  Incomplete     Studies: Dg Orthopantogram  01/14/2015   CLINICAL DATA:  Staphylococcal endocarditis.  Poor dentition.  EXAM: ORTHOPANTOGRAM/PANORAMIC  COMPARISON:  None.  FINDINGS: Extensive dental disease with caries involving multiple teeth, including numbers 7, 8, 9, 10, 11, 12, 13, 14, 15, 16, 19, 20, 29, 30, 31 and 32. Periapical lucencies are present adjacent to teeth numbers 29 and 30.  IMPRESSION: Extensive dental disease as described.   Electronically Signed   By: Evangeline Dakin M.D.   On: 01/14/2015 21:31   Dg Chest 2 View  01/14/2015   CLINICAL DATA:  Right shoulder pain for 6 days.  EXAM: CHEST  2 VIEW  COMPARISON:  01/10/2015  FINDINGS: Two views of the chest demonstrate low lung volumes with bibasilar chest densities, left side greater than right. In addition, the heart appears to be enlarged. The configuration of the heart raises the possibility for pericardial fluid. No evidence for pulmonary edema. Difficult to exclude small effusions, particularly on the left side.  IMPRESSION: Low lung volumes with bibasilar densities, left side greater than right. Basilar densities could represent a combination of atelectasis and pleural fluid.  Heart appears enlarged which may be accentuated by low lung volumes. Pericardial fluid cannot be excluded.   Electronically Signed   By: Markus Daft M.D.   On: 01/14/2015 08:17   Dg Shoulder Right  01/09/2015   CLINICAL DATA:  Anterior right shoulder pain  EXAM: RIGHT SHOULDER - 2+ VIEW  COMPARISON:   None.  FINDINGS: No fracture or dislocation is seen.  The joint spaces are preserved.  The visualized soft tissues are unremarkable.  Visualized right lung is clear.  IMPRESSION: No fracture or dislocation is seen.   Electronically Signed   By: Julian Hy M.D.   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  EXAM: MRI HEAD WITHOUT CONTRAST  TECHNIQUE: Multiplanar, multiecho pulse sequences of the brain and surrounding structures were obtained without  intravenous contrast.  COMPARISON:  Prior study from 04/25/2012  FINDINGS: Cerebral volume within normal limits for patient age. No significant chronic small vessel ischemic changes identified. No mass lesion, midline shift, or mass effect. Ventricles are normal in size without evidence of hydrocephalus. No extra-axial fluid collection.  There are multiple subcentimeter ischemic infarcts involving predominantly the supratentorial brain. These involve both frontal lobes, the right parietal lobe, left temporal lobe, ischemic infarct within the right is cerebral hemisphere is located in the anterior right frontal lobe and measures 5 mm (series 3, image 36). The largest infarct in the left cerebral hemisphere is located in the deep white matter of the anterior left frontal lobe and measures 13 mm (series 3, image 30). There are infarcts involving the bilateral basal ganglia, the most prominent of which located in the right thalamus which measures 4 mm. There is a single infratentorial infarct within the left cerebellar hemisphere measuring 4 mm. These are most likely embolic in nature given the history of endocarditis and multiple vascular distributions. Scattered susceptibility artifact seen associated with a few of these infarcts, likely reflecting small petechial hemorrhages. No frank hemorrhagic conversion There is associated T2/FLAIR signal intensity without  significant mass effect.  Craniocervical junction within normal limits. Pituitary gland normal.  No acute abnormality seen about the orbits.  Paranasal sinuses are clear.  No mastoid effusion.  Bone marrow signal intensity within normal limits. Visualized upper cervical spine unremarkable.  Scalp soft tissues within normal limits.  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 These are likely embolic in nature given the history of endocarditis and involvement of multiple vascular distributions. No significant mass effect. There are likely scattered small petechial hemorrhages with a number these infarcts without frank hemorrhage.   Electronically Signed   By: Jeannine Boga M.D.   On: 01/14/2015 21:24   US Renal  01/12/2015   CLINICAL DATA:  Acute renal failure  EXAM: RENAL/URINARY TRACT ULTRASOUND COMPLETE  COMPARISON:  None.  FINDINGS: Right Kidney:  Length: 12.8 cm in length. Diffuse increased cortical echogenicity suspicious for medical renal disease. No hydronephrosis. No renal calculus.  Left Kidney:  Length: . 12.6 cm in length. Diffuse increased echogenicity suspicious for medical renal disease. No hydronephrosis. No diagnostic renal calculus.  Bladder:  Appears normal for degree of bladder distention.  IMPRESSION: Bilateral kidneys shows diffuse increased echogenicity. Medical renal disease cannot be excluded. No hydronephrosis or diagnostic renal calculus.   Electronically Signed   By: Lahoma Crocker M.D.   On: 01/12/2015 16:08   Mr Shoulder Right Wo Contrast  01/11/2015   CLINICAL DATA:  Right shoulder pain.  EXAM: MRI OF THE RIGHT SHOULDER WITHOUT CONTRAST  TECHNIQUE: Multiplanar, multisequence MR imaging of the shoulder was performed. No intravenous contrast was administered.  COMPARISON:  Radiographs dated 01/09/2015  FINDINGS: Rotator cuff:  Normal.  Muscles:  Normal.  Biceps long head: Properly located and intact. There is  inflammation in the soft tissues around the bicipital tendon sheath with fluid in the bicipital tendon sheath.  Acromioclavicular Joint: Small degenerative cyst in the acromion. Otherwise normal AC joint. Type 1 acromion. No bursitis.  Glenohumeral Joint: There is a moderate glenohumeral joint effusion with extravasation of contrast from the subcoracoid recess of the joint into the adjacent soft tissues. Fluid distends the bicipital tendon sheath.  Labrum:  Intact.  Bones: No significant abnormalities. Tiny degenerative cystic areas in the posterior aspect of the greater tuberosity of the proximal humerus.  IMPRESSION:  1. Moderate glenohumeral joint effusion with some extravasation of joint. Fluid distends the bicipital tendon sheath with inflammation/extravasated joint fluid around the bicipital tendon sheath. The possibly of synovitis should be considered. 2. Normal rotator cuff.   Electronically Signed   By: Rozetta Nunnery M.D.   On: 01/11/2015 11:21   Mr Tibia Fibula Right Wo Contrast  01/15/2015   CLINICAL DATA:  Diabetic patient with bacteremia and right leg pain.  EXAM: MRI OF LOWER RIGHT EXTREMITY WITHOUT CONTRAST  TECHNIQUE: Multiplanar, multisequence MR imaging of the right lower leg was performed. No intravenous contrast was administered.  COMPARISON:  None.  FINDINGS: The axial and coronal sequences incidentally include the left lower leg. There is marked subcutaneous edema of both lower legs which appears slightly worse on the left. No focal subcutaneous fluid collection is identified. Mildly increased T2 signal is seen in the gastrocnemius, soleus and and tibialis posterior muscles bilaterally which appears symmetric. No intramuscular fluid collection is identified. No bone marrow signal abnormality to suggest osteomyelitis is seen. There is no muscle or tendon tear.  IMPRESSION: Intense bilateral lower leg subcutaneous edema appears worse on the left and could be due to dependent change or  cellulitis.  Mildly increased T2 signal in the gastrocnemius, soleus and tibialis posterior muscles bilaterally could be due to myositis or early changes of denervation atrophy in this diabetic patient.  Negative for abscess or osteomyelitis.   Electronically Signed   By: Inge Rise M.D.   On: 01/15/2015 08:28   Mr Foot Right Wo Contrast  01/11/2015   CLINICAL DATA:  Diabetic foot ulcers.  EXAM: MRI OF THE RIGHT FOREFOOT WITHOUT CONTRAST  TECHNIQUE: Multiplanar, multisequence MR imaging was performed. No intravenous contrast was administered.  COMPARISON:  Radiographs dated 01/09/2011  FINDINGS: There are small erosions of the proximal phalanx at the IP joint of the great toe and there are small erosions at the first metatarsal phalangeal joint, both consistent with gout. There are no soft tissue abscesses. There is no osteomyelitis. There is nonspecific edema in the subcutaneous fat of the dorsum of the foot. Muscles and tendons and ligaments appear appear normal. No joint effusions.  IMPRESSION: Subcutaneous edema primarily on the dorsum of the foot. 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   Electronically Signed   By: Rozetta Nunnery M.D.   On: 01/11/2015 12:50   Mr Femur Left Wo Contrast  01/12/2015   CLINICAL DATA:  Diabetes. Peripheral neuropathy. Sepsis. Bilateral thigh pain. RIGHT thigh pain. Tenderness in the medial aspect of the thigh.  EXAM: MR OF THE LEFT FEMUR WITHOUT CONTRAST  TECHNIQUE: Multiplanar, multisequence MR imaging was performed. No intravenous contrast was administered.  COMPARISON:  None.  FINDINGS: Diffuse subcutaneous edema is present around the pelvis and in both thighs, most compatible with anasarca. This could also represent cellulitis in the appropriate clinical setting. Subcutaneous fluid is present in the lateral LEFT thigh, without a discrete abscess. There is myositis of the LEFT vastus lateralis with mild muscular edema av but no significant swelling. No fatty atrophy.  Bone  marrow signal shows heterogenous marrow. This is a nonspecific finding most commonly associated with obesity, anemia, cigarette smoking or chronic disease. Negative for hip fracture or AVN. There is no evidence of osteomyelitis. No deep soft tissue abscesses are present.  Bilaterally in the groin, there are prominent lymph nodes which are probably reactive to lower extremity process.  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  IMPRESSION: 1. Diffuse subcutaneous edema in the pelvis and thighs bilaterally, suggesting anasarca. 2. More prominent subcutaneous edema along the LEFT thigh with subcutaneous fluid superficial to the muscular fascia, likely representing cellulitis. No abscess. 3. Myositis of the proximal vastus lateralis, probably infectious. Reactive edema could produce this appearance as well. 4. Reactive inguinal adenopathy bilaterally. 5. Negative for osteomyelitis.   Electronically Signed   By: Dereck Ligas M.D.   On: 01/12/2015 14:33   Mr Foot Left Wo Contrast  01/11/2015   CLINICAL DATA:  Diabetic foot ulcers.  Foot pain.  Bony erosions.  EXAM: MRI OF THE LEFT FOREFOOT WITHOUT CONTRAST  TECHNIQUE: Multiplanar, multisequence MR imaging was performed. No intravenous contrast was administered.  COMPARISON:  Radiograph dated 01/09/2015  FINDINGS: There is slight edema throughout the medial cuneiform. There is artifactual increased signal from the metatarsal heads in from the proximal phalanx of the great toe. There are erosions at IP joint and first metatarsal phalangeal joint consistent with gout.  There are no soft tissue abscesses and there is no osteomyelitis. There is prominent edema in the subcutaneous fat of the dorsum of the foot.  IMPRESSION: No evidence of osteomyelitis or abscess. The edema in the medial cuneiform is most likely a stress reaction. 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   Electronically Signed   By: Rozetta Nunnery M.D.   On: 01/11/2015 12:15   Dg Chest Port 1 View  01/10/2015   CLINICAL DATA:  Fever and sweats.  EXAM: PORTABLE CHEST - 1 VIEW  COMPARISON:  None.  FINDINGS: Low lung volumes with vascular crowding and streaky subsegmental atelectasis. No obvious infiltrate or effusion. The heart is within normal limits in size given the AP portable technique. The bony structures are grossly normal.  IMPRESSION: Low lung volumes with vascular crowding and bibasilar atelectasis.   Electronically Signed   By: Kalman Jewels M.D.   On: 01/10/2015 11:27   Dg Foot Complete Left  01/09/2015   CLINICAL DATA:  35 year old male with recurrent bilateral foot pain.  EXAM: LEFT FOOT - COMPLETE 3+ VIEW  COMPARISON:  01/05/2014.  FINDINGS: Large periarticular erosions are noted adjacent to the first MTP joint, and at the first interphalangeal joint along the medial margin in both locations. These have erosions have sclerotic margins and overhanging edges, and there is adjacent soft tissue prominence, suggestive of gouty erosions. No adjacent soft tissue calcification is noted. No acute displaced fracture, subluxation or dislocation. Small heterotopic ossification adjacent to the plantar aspect of the calcaneus, likely ossification within the plantar fascia.  IMPRESSION: 1. Findings, as above, suggestive of underlying gout. Clinical correlation is recommended.   Electronically Signed   By: Vinnie Langton M.D.   On: 01/09/2015 09:46   Dg Foot Complete Right  01/09/2015   CLINICAL DATA:  Recurring bilateral foot pain  EXAM: RIGHT FOOT COMPLETE - 3+ VIEW  COMPARISON:  None.  FINDINGS: No fracture or dislocation is seen.  Marginal erosion at the medial aspect of the 1st IP joint, involving the proximal phalanx.  The visualized soft tissues are unremarkable.  IMPRESSION: Marginal erosion at the medial aspect of the 1st IP joint. Correlate for inflammatory arthropathy such as gout.   Otherwise, no acute osseus abnormality is seen.   Electronically Signed   By: Julian Hy M.D.   On: 01/09/2015 09:44   Dg Fluoro Guide Ndl Plc/bx  01/14/2015   CLINICAL DATA:  Right shoulder  pain and effusion.  Bacteremia.  EXAM: RIGHT SHOULDER ASPIRATION UNDER FLUOROSCOPY  FLUOROSCOPY TIME:  0 min 24 seconds  PROCEDURE: Overlying skin prepped with Betadine, draped in the usual sterile fashion, and infiltrated locally with buffered Lidocaine. Twenty gauge spinal needle was placed in the right glenohumeral joint under direct fluoroscopic visualization.  2 cc of bloody cloudy fluid was aspirated from the joint. Mr. Gruenhagen tolerated the procedure well.  IMPRESSION: Technically successful right shoulder aspiration under fluoroscopy. Fluid sent for laboratory analysis.   Electronically Signed   By: Rozetta Nunnery M.D.   On: 01/14/2015 09:30    Scheduled Meds: . chlorhexidine  15 mL Mouth/Throat BID  . feeding supplement (GLUCERNA SHAKE)  237 mL Oral BID BM  . feeding supplement (PRO-STAT SUGAR FREE 64)  30 mL Oral Q1500  . Influenza vac split quadrivalent PF  0.5 mL Intramuscular Tomorrow-1000  . insulin aspart  0-15 Units Subcutaneous TID WC  . insulin aspart  0-5 Units Subcutaneous QHS  . insulin aspart protamine- aspart  20 Units Subcutaneous BID WC  . living well with diabetes book   Does not apply Once  . metoprolol tartrate  25 mg Oral BID  . nafcillin  2 g Intravenous Q4H  . neomycin-bacitracin-polymyxin   Topical Daily  . nicotine  7 mg Transdermal Daily  . sodium chloride  500 mL Intravenous Once   Continuous Infusions: . 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 Problems:   Tobacco abuse   Noncompliance   Dehydration   Diarrhea   Diabetes type 2, uncontrolled   Hyperglycemia   Uncontrolled hypertension   Diabetic foot ulcers   Pain   Diabetic ulcer of left foot associated with type 2  diabetes mellitus   Diabetic ulcer of right foot associated with type 2 diabetes mellitus   Osteomyelitis   Shoulder pain   Thigh pain, musculoskeletal   Septic shock   Staphylococcus aureus bacteremia with sepsis   Smoker   Elevated troponin   Acute renal failure   Bacteremia   Septic joint of right shoulder region   Infective myositis of left thigh   Purulent pericarditis   Mitral regurgitation   Endocarditis    Time spent: 40 minutes   [facility] Pager [phone]. If 7PM-7AM, please contact night-coverage at [email or web address], password [facility] 01/15/2015, 12:19 PM  LOS: 6 days

## 2015-01-15 NOTE — Progress Notes (Signed)
   VASCULAR SURGERY ASSESSMENT & PLAN:  * The patient continues IV Ancef. It does appear that his right shoulder is also infected. MRI of both feet- no abscess or osteo. MRI of the thigh shows myositis of the proximal vastus lateralis.  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   *  Venous Duplex of Left LE pending.   * Renal: Todays crt pending.   SUBJECTIVE: No specific complaints this AM.   PHYSICAL EXAM: Filed Vitals:   01/14/15 1604 01/14/15 2115 01/15/15 0148 01/15/15 0454  BP: 166/90 151/81 151/90 157/90  Pulse: 90 95 92 93  Temp: 98.3 F (36.8 C) 98.2 F (36.8 C) 98.4 F (36.9 C) 98.1 F (36.7 C)  TempSrc: Oral Oral Oral Oral  Resp: 18 18 18 18   Height:      Weight:      SpO2: 94% 99% 97% 95%   No change in exam. No open wounds on feet.  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 Problems:   Tobacco abuse   Noncompliance   Dehydration   Diarrhea   Diabetes type 2, uncontrolled   Hyperglycemia   Uncontrolled hypertension   Diabetic foot ulcers   Pain   Diabetic ulcer of left foot associated with type 2 diabetes mellitus   Diabetic ulcer of right foot associated with type 2 diabetes mellitus   Osteomyelitis   Shoulder pain   Thigh pain, musculoskeletal   Septic shock   Staphylococcus aureus bacteremia with sepsis   Smoker   Elevated troponin   Acute renal failure   Bacteremia   Septic joint of right shoulder region  Infective myositis of left thigh   Purulent pericarditis   Mitral regurgitation   Endocarditis   Dwayne [name] Beeper: [phone] 01/15/2015

## 2015-01-16 DIAGNOSIS — M4644 Discitis, unspecified, thoracic region: Secondary | ICD-10-CM | POA: Insufficient documentation

## 2015-01-16 DIAGNOSIS — M869 Osteomyelitis, unspecified: Secondary | ICD-10-CM

## 2015-01-16 DIAGNOSIS — I38 Endocarditis, valve unspecified: Secondary | ICD-10-CM

## 2015-01-16 LAB — GLUCOSE, CAPILLARY
GLUCOSE-CAPILLARY: 258 mg/dL — AB (ref 70–99)
GLUCOSE-CAPILLARY: 137 mg/dL — AB (ref 70–99)
Glucose-Capillary: 154 mg/dL — ABNORMAL HIGH (ref 70–99)
Glucose-Capillary: 229 mg/dL — ABNORMAL HIGH (ref 70–99)

## 2015-01-16 LAB — COMPREHENSIVE METABOLIC PANEL
ALT: 19 U/L (ref 0–53)
ANION GAP: 8 (ref 5–15)
AST: 42 U/L — AB (ref 0–37)
Albumin: 1.2 g/dL — ABNORMAL LOW (ref 3.5–5.2)
Alkaline Phosphatase: 233 U/L — ABNORMAL HIGH (ref 39–117)
BUN: 23 mg/dL (ref 6–23)
CALCIUM: 7.5 mg/dL — AB (ref 8.4–10.5)
CO2: 23 mmol/L (ref 19–32)
CREATININE: 1.99 mg/dL — AB (ref 0.50–1.35)
Chloride: 104 mEq/L (ref 96–112)
GFR calc Af Amer: 49 mL/min — ABNORMAL LOW (ref >90)
GFR, EST NON AFRICAN AMERICAN: 42 mL/min — AB (ref >90)
Glucose, Bld: 294 mg/dL — ABNORMAL HIGH (ref 70–99)
Potassium: 3.6 mmol/L (ref 3.5–5.1)
SODIUM: 135 mmol/L (ref 135–145)
Total Bilirubin: 0.9 mg/dL (ref 0.3–1.2)
Total Protein: 5.5 g/dL — ABNORMAL LOW (ref 6.0–8.3)

## 2015-01-16 LAB — CBC
HCT: 27 % — ABNORMAL LOW (ref 39.0–52.0)
HEMOGLOBIN: 9.1 g/dL — AB (ref 13.0–17.0)
MCH: 28.2 pg (ref 26.0–34.0)
MCHC: 33.7 g/dL (ref 30.0–36.0)
MCV: 83.6 fL (ref 78.0–100.0)
PLATELETS: 372 10*3/uL (ref 150–400)
RBC: 3.23 MIL/uL — AB (ref 4.22–5.81)
RDW: 13.9 % (ref 11.5–15.5)
WBC: 12.2 10*3/uL — ABNORMAL HIGH (ref 4.0–10.5)

## 2015-01-16 MED ORDER — INSULIN ASPART 100 UNIT/ML ~~LOC~~ SOLN
0.0000 [IU] | Freq: Every day | SUBCUTANEOUS | Status: DC
  Administered 2015-01-16: 2 [IU] via SUBCUTANEOUS
  Administered 2015-01-17: 5 [IU] via SUBCUTANEOUS
  Administered 2015-01-18: 2 [IU] via SUBCUTANEOUS
  Administered 2015-01-19: 5 [IU] via SUBCUTANEOUS
  Administered 2015-01-21: 2 [IU] via SUBCUTANEOUS
  Administered 2015-01-23 – 2015-01-26 (×2): 3 [IU] via SUBCUTANEOUS

## 2015-01-16 MED ORDER — INSULIN ASPART PROT & ASPART (70-30 MIX) 100 UNIT/ML ~~LOC~~ SUSP
25.0000 [IU] | Freq: Two times a day (BID) | SUBCUTANEOUS | Status: DC
Start: 2015-01-16 — End: 2015-01-17
  Administered 2015-01-16: 25 [IU] via SUBCUTANEOUS
  Filled 2015-01-16: qty 10

## 2015-01-16 MED ORDER — METOPROLOL TARTRATE 50 MG PO TABS
50.0000 mg | ORAL_TABLET | Freq: Two times a day (BID) | ORAL | Status: DC
  Administered 2015-01-16 – 2015-01-25 (×20): 50 mg via ORAL
  Filled 2015-01-16 (×22): qty 1

## 2015-01-16 MED ORDER — HYDRALAZINE HCL 25 MG PO TABS
25.0000 mg | ORAL_TABLET | Freq: Three times a day (TID) | ORAL | Status: DC
Start: 2015-01-16 — End: 2015-01-28
  Administered 2015-01-16 – 2015-01-28 (×37): 25 mg via ORAL
  Filled 2015-01-16 (×39): qty 1

## 2015-01-16 NOTE — Progress Notes (Signed)
[facility]    Subjective: C/o mid to  lower back pain, and also of persistent orthopnea   Antibiotics:  Anti-infectives    Start     Dose/Rate Route Frequency Ordered Stop   01/15/15 1600  nafcillin 2 g in dextrose 5 % 50 mL IVPB  Status:  Discontinued     2 g100 mL/hr over 30 Minutes Intravenous 6 times per day 01/15/15 1503 01/15/15 1517   01/15/15 1600  nafcillin 2 g in dextrose 5 % 50 mL IVPB     2 g100 mL/hr over 30 Minutes Intravenous 6 times per day 01/15/15 1517     01/15/15 0930  nafcillin injection 2 g  Status:  Discontinued     2 g Intravenous Every 4 hours 01/15/15 0921 01/15/15 1503   01/14/15 1500  DAPTOmycin (CUBICIN) 600 mg in sodium chloride 0.9 % IVPB  Status:  Discontinued     600 mg224 mL/hr over 30 Minutes Intravenous Every 48 hours 01/12/15 1421 01/13/15 1239   01/13/15 1500  ceFAZolin (ANCEF) IVPB 2 g/50 mL premix  Status:  Discontinued     2 g100 mL/hr over 30 Minutes Intravenous 3 times per day 01/13/15 1407 01/15/15 0921   01/12/15 1600  ceFAZolin (ANCEF) IVPB 2 g/50 mL premix  Status:  Discontinued     2 g100 mL/hr over 30 Minutes Intravenous Every 12 hours 01/12/15 1419 01/13/15 1407   01/11/15 1200  DAPTOmycin (CUBICIN) 600 mg in sodium chloride 0.9 % IVPB  Status:  Discontinued     600 mg224 mL/hr over 30 Minutes Intravenous Every 24 hours 01/11/15 1003 01/12/15 1421   01/10/15 1200  vancomycin (VANCOCIN) IVPB 1000 mg/200 mL premix  Status:  Discontinued     1,000 mg200 mL/hr over 60 Minutes Intravenous Every 8 hours 01/10/15 1044 01/11/15 0947   01/10/15 1100  piperacillin-tazobactam (ZOSYN) IVPB 3.375 g  Status:  Discontinued     3.375 g12.5 mL/hr over 240 Minutes Intravenous Every 8 hours 01/10/15 1043 01/11/15 0902   01/09/15 0700  metroNIDAZOLE (FLAGYL) IVPB 500 mg  Status:  Discontinued     500 mg100 mL/hr over 60 Minutes Intravenous Every 8 hours 01/09/15 0651 01/10/15 1018      Medications: Scheduled Meds: .  chlorhexidine  15 mL Mouth/Throat BID  . feeding supplement (GLUCERNA SHAKE)  237 mL Oral BID BM  . feeding supplement (PRO-STAT SUGAR FREE 64)  30 mL Oral Q1500  . hydrALAZINE  25 mg Oral 3 times per day  . Influenza vac split quadrivalent PF  0.5 mL Intramuscular Tomorrow-1000  . insulin aspart  0-15 Units Subcutaneous QAC lunch  . insulin aspart  0-5 Units Subcutaneous QHS  . insulin aspart protamine- aspart  25 Units Subcutaneous BID WC  . living well with diabetes book   Does not apply Once  . metoprolol tartrate  50 mg Oral BID  . nafcillin IV  2 g Intravenous 6 times per day  . neomycin-bacitracin-polymyxin   Topical Daily  . nicotine  7 mg Transdermal Daily  . sodium chloride  500 mL Intravenous Once   Continuous Infusions: . 0.9 % NaCl with KCl 40 mEq / L 100 mL/hr (01/16/15 1017)   PRN Meds:.acetaminophen **OR** acetaminophen, alum & mag hydroxide-simeth, hydrALAZINE, HYDROmorphone (DILAUDID) injection, ondansetron **OR** ondansetron (ZOFRAN) IV, oxyCODONE    Objective: Weight change:   Intake/Output Summary (Last 24 hours) at 01/16/15 1252 Last data filed at 01/16/15 1206  Gross per 24 hour  Intake   1475 ml  Output   1650 ml  Net   -175 ml   Blood pressure 160/98, pulse 88, temperature 98.8 F (37.1 C), temperature source Oral, resp. rate 16, height 6' 2"  (1.88 m), weight 226 lb 6.4 oz (102.694 kg), SpO2 100 %. Temp:  [98.7 F (37.1 C)-99.4 F (37.4 C)] 98.8 F (37.1 C) (01/17 0425) Pulse Rate:  [81-93] 88 (01/17 1018) Resp:  [16] 16 (01/17 0425) BP: (153-167)/(81-98) 160/98 mmHg (01/17 1018) SpO2:  [96 %-100 %] 100 % (01/17 0425)  Physical Exam: General: aox3  CV: Systolic murmur loudest at PMI Pulm: clear, no wheezes or rhonchi Thighs: has medial tenderness esp on left which is worse Shoulder: less tender than a few days ago Neuro; non focal CBC:   CBC Latest Ref Rng 01/16/2015 01/15/2015 01/14/2015  WBC 4.0 - 10.5 K/uL 12.2(H) 12.4(H) 10.8(H)    Hemoglobin 13.0 - 17.0 g/dL 9.1(L) 9.8(L) 9.9(L)  Hematocrit 39.0 - 52.0 % 27.0(L) 29.3(L) 29.1(L)  Platelets 150 - 400 K/uL 372 411(H) 264     BMET  Recent Labs  01/15/15 1540 01/16/15 0509  NA 135 135  K 3.3* 3.6  CL 104 104  CO2 22 23  GLUCOSE 206* 294*  BUN 27* 23  CREATININE 2.19* 1.99*  CALCIUM 7.9* 7.5*     Liver Panel   Recent Labs  01/15/15 1540 01/16/15 0509  PROT 6.0 5.5*  ALBUMIN 1.3* 1.2*  AST 40* 42*  ALT 14 19  ALKPHOS 263* 233*  BILITOT 0.3 0.9       Sedimentation Rate No results for input(s): ESRSEDRATE in the last 72 hours. C-Reactive Protein No results for input(s): CRP in the last 72 hours.  Micro Results: Recent Results (from the past 720 hour(s))  Urine culture     Status: None   Collection Time: 01/09/15  1:31 AM  Result Value Ref Range Status   Specimen Description URINE, CLEAN CATCH  Final   Special Requests NONE  Final   Colony Count   Final    3,000 COLONIES/ML Performed at [facility]    Culture   Final    INSIGNIFICANT GROWTH Performed at [facility]    Report Status 01/10/2015 FINAL  Final  Stool culture     Status: None   Collection Time: 01/09/15  4:09 AM  Result Value Ref Range Status   Specimen Description VAGINAL/RECTAL  Final   Special Requests Normal  Final   Culture   Final    NO SALMONELLA, SHIGELLA, CAMPYLOBACTER, YERSINIA, OR E.COLI 0157:H7 ISOLATED Performed at [facility]    Report Status 01/12/2015 FINAL  Final  Clostridium Difficile by PCR     Status: None   Collection Time: 01/09/15  4:09 AM  Result Value Ref Range Status   C difficile by pcr NEGATIVE NEGATIVE Final  Culture, blood (routine x 2)     Status: None   Collection Time: 01/10/15  4:35 AM  Result Value Ref Range Status   Specimen Description BLOOD RIGHT HAND  Final   Special Requests BOTTLES DRAWN AEROBIC AND ANAEROBIC 10CC EA  Final   Culture   Final    STAPHYLOCOCCUS AUREUS Note: SUSCEPTIBILITIES  PERFORMED ON PREVIOUS CULTURE WITHIN THE LAST 5 DAYS. Note: Culture results may be compromised due to an excessive volume of blood received in culture bottles. Gram Stain Report Called to,Read Back By and Verified With:  MONICA YIM ON 01/10/2015 AT 11:36P BY WILEJ Performed at [facility]    Report Status 01/13/2015 FINAL  Final  Culture, blood (routine x 2)     Status: None   Collection Time: 01/10/15  4:46 AM  Result Value Ref Range Status   Specimen Description BLOOD LEFT ARM  Final   Special Requests BOTTLES DRAWN AEROBIC AND ANAEROBIC 10CC EA  Final   Culture   Final    STAPHYLOCOCCUS AUREUS Note: SUSCEPTIBILITIES PERFORMED ON PREVIOUS CULTURE WITHIN THE LAST 5 DAYS. Note: Culture results may be compromised due to an excessive volume of blood received in culture bottles. Gram Stain Report Called to,Read Back By and Verified With: MONICA YIM ON 01/10/2015 AT 11:36P BY WILEJ Performed at [facility]    Report Status 01/13/2015 FINAL  Final  Respiratory virus panel (routine influenza)     Status: None   Collection Time: 01/10/15 10:34 AM  Result Value Ref Range Status   Source - RVPAN NASAL SWAB  Corrected    Comment: CORRECTED ON 01/12 AT 2036: PREVIOUSLY REPORTED AS NASAL SWAB   Respiratory Syncytial Virus A NOT DETECTED  Final   Respiratory Syncytial Virus B NOT DETECTED  Final   Influenza A NOT DETECTED  Final   Influenza B NOT DETECTED  Final   Parainfluenza 1 NOT DETECTED  Final   Parainfluenza 2 NOT DETECTED  Final   Parainfluenza 3 NOT DETECTED  Final   Metapneumovirus NOT DETECTED  Final   Rhinovirus NOT DETECTED  Final   Adenovirus NOT DETECTED  Final   Influenza A H1 NOT DETECTED  Final   Influenza A H3 NOT DETECTED  Final    Comment: (NOTE)       Normal Reference Range for each Analyte: NOT DETECTED Testing performed using the Luminex xTAG Respiratory Viral Panel test kit. The analytical performance characteristics of this assay have  been determined by [facility].  The modifications have not been cleared or approved by the FDA. This assay has been validated pursuant to the CLIA regulations and is used for clinical purposes. Performed at [facility], blood (routine x 2)     Status: None   Collection Time: 01/10/15 11:19 AM  Result Value Ref Range Status   Specimen Description BLOOD LEFT FOREARM  Final   Special Requests BOTTLES DRAWN AEROBIC ONLY 5CC  Final   Culture   Final    STAPHYLOCOCCUS AUREUS Note: RIFAMPIN AND GENTAMICIN SHOULD NOT BE USED AS SINGLE DRUGS FOR TREATMENT OF STAPH INFECTIONS. Note: Gram Stain Report Called to,Read Back By and Verified With: NURSE MONICA YIM 01/11/15 6:05AM THOMI Performed at [facility]    Report Status 01/13/2015 FINAL  Final   Organism ID, Bacteria STAPHYLOCOCCUS AUREUS  Final      Susceptibility   Staphylococcus aureus - MIC*    CLINDAMYCIN <=0.25 SENSITIVE Sensitive     ERYTHROMYCIN <=0.25 SENSITIVE Sensitive     GENTAMICIN <=0.5 SENSITIVE Sensitive     LEVOFLOXACIN 4 INTERMEDIATE Intermediate     OXACILLIN 0.5 SENSITIVE Sensitive     PENICILLIN 0.12 SENSITIVE Sensitive     RIFAMPIN 2 INTERMEDIATE Intermediate     TRIMETH/SULFA <=10 SENSITIVE Sensitive     VANCOMYCIN 1 SENSITIVE Sensitive     TETRACYCLINE <=1 SENSITIVE Sensitive     MOXIFLOXACIN 2 RESISTANT Resistant     * STAPHYLOCOCCUS AUREUS  Culture, blood (routine x 2)     Status: None   Collection Time: 01/10/15 11:27 AM  Result Value Ref Range Status  Specimen Description BLOOD LEFT ANTECUBITAL  Final   Special Requests BOTTLES DRAWN AEROBIC ONLY [place]  Final   Culture   Final    STAPHYLOCOCCUS AUREUS Note: SUSCEPTIBILITIES PERFORMED ON PREVIOUS CULTURE WITHIN THE LAST 5 DAYS. Note: Gram Stain Report Called to,Read Back By and Verified With: NURSE MONICA YIM 01/11/15 6:05AM THOMI Performed at [facility]    Report Status 01/13/2015 FINAL  Final  Culture, blood  (routine x 2)     Status: None (Preliminary result)   Collection Time: 01/13/15  7:20 PM  Result Value Ref Range Status   Specimen Description BLOOD RIGHT ARM  Final   Special Requests BOTTLES DRAWN AEROBIC AND ANAEROBIC 5CC  Final   Culture   Final           BLOOD CULTURE RECEIVED NO GROWTH TO DATE CULTURE WILL BE HELD FOR 5 DAYS BEFORE ISSUING A FINAL NEGATIVE REPORT Performed at [facility]    Report Status PENDING  Incomplete  Culture, blood (routine x 2)     Status: None (Preliminary result)   Collection Time: 01/13/15  7:20 PM  Result Value Ref Range Status   Specimen Description BLOOD RIGHT ARM  Final   Special Requests BOTTLES DRAWN AEROBIC AND ANAEROBIC 3CC  Final   Culture   Final           BLOOD CULTURE RECEIVED NO GROWTH TO DATE CULTURE WILL BE HELD FOR 5 DAYS BEFORE ISSUING A FINAL NEGATIVE REPORT Performed at [facility]    Report Status PENDING  Incomplete  Body fluid culture     Status: None (Preliminary result)   Collection Time: 01/14/15  9:02 AM  Result Value Ref Range Status   Specimen Description FLUID RIGHT SHOULDER SYNOVIAL  Final   Special Requests NONE  Final   Gram Stain   Final    FEW WBC PRESENT,BOTH PMN AND MONONUCLEAR NO ORGANISMS SEEN Performed at [facility]    Culture   Final    NO GROWTH 2 DAYS Performed at [facility]    Report Status PENDING  Incomplete    Studies/Results: Dg Orthopantogram  01/14/2015   CLINICAL DATA:  Staphylococcal endocarditis.  Poor dentition.  EXAM: ORTHOPANTOGRAM/PANORAMIC  COMPARISON:  None.  FINDINGS: Extensive dental disease with caries involving multiple teeth, including numbers 7, 8, 9, 10, 11, 12, 13, 14, 15, 16, 19, 20, 29, 30, 31 and 32. Periapical lucencies are present adjacent to teeth numbers 29 and 30.  IMPRESSION: Extensive dental disease as described.   Electronically Signed   By: Evangeline Dakin M.D.   On: 01/14/2015 21:31   Mr Brain Wo Contrast  01/14/2015    CLINICAL DATA:  35 year old male with history of staph aureus bacteremia and endocarditis.  EXAM: MRI HEAD WITHOUT CONTRAST  TECHNIQUE: Multiplanar, multiecho pulse sequences of the brain and surrounding structures were obtained without intravenous contrast.  COMPARISON:  Prior study from 04/25/2012  FINDINGS: Cerebral volume within normal limits for patient age. No significant chronic small vessel ischemic changes identified. No mass lesion, midline shift, or mass effect. Ventricles are normal in size without evidence of hydrocephalus. No extra-axial fluid collection.  There are multiple subcentimeter ischemic infarcts involving predominantly the supratentorial brain. These involve both frontal lobes, the right parietal lobe, left temporal lobe, ischemic infarct within the right is cerebral hemisphere is located in the anterior right frontal lobe and measures 5 mm (series 3, image 36). The largest infarct in the left cerebral hemisphere is  located in the deep white matter of the anterior left frontal lobe and measures 13 mm (series 3, image 30). There are infarcts involving the bilateral basal ganglia, the most prominent of which located in the right thalamus which measures 4 mm. There is a single infratentorial infarct within the left cerebellar hemisphere measuring 4 mm. These are most likely embolic in nature given the history of endocarditis and multiple vascular distributions. Scattered susceptibility artifact seen associated with a few of these infarcts, likely reflecting small petechial hemorrhages. No frank hemorrhagic conversion There is associated T2/FLAIR signal intensity without significant mass effect.  Craniocervical junction within normal limits. Pituitary gland normal.  No acute abnormality seen about the orbits.  Paranasal sinuses are clear.  No mastoid effusion.  Bone marrow signal intensity within normal limits. Visualized upper cervical spine unremarkable.  Scalp soft tissues within normal  limits.  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 These are likely embolic in nature given the history of endocarditis and involvement of multiple vascular distributions. No significant mass effect. There are likely scattered small petechial hemorrhages with a number these infarcts without frank hemorrhage.   Electronically Signed   By: Jeannine Boga M.D.   On: 01/14/2015 21:24   Mr Thoracic Spine Wo Contrast  01/15/2015   ADDENDUM REPORT: 01/15/2015 14:40  ADDENDUM: Study discussed by telephone with Dr. Rhina Brackett DAM on 01/15/2015 at 1435 hrs.   Electronically Signed   By: Lars Pinks M.D.   On: 01/15/2015 14:40   01/15/2015   CLINICAL DATA:  35 year old male with sepsis and back pain. Into card itis, purulent pericarditis, multiple small embolic infarcts. Initial encounter.  EXAM: MRI THORACIC AND LUMBAR SPINE WITHOUT CONTRAST  TECHNIQUE: Multiplanar and multiecho pulse sequences of the thoracic and lumbar spine were obtained without intravenous contrast.  COMPARISON:  Chest radiographs 01/14/2015. CT Abdomen and Pelvis 05/18/2013.  FINDINGS: MR THORACIC SPINE FINDINGS  Limited sagittal imaging of the cervical spine is unremarkable.  Preserved thoracic vertebral height and alignment, preserved thoracic vertebral height and alignment.  Throughout much of the T3 and T4 vertebral bodies there is heterogeneous T2 signal as well as patchy pre STIR signal. The STIR images were repeated, an the same patchy increased signal persisted at T3 and T4.  No definite signal abnormality in the intervening disc, or adjacent disc spaces; there is widespread intervertebral disc T2 hyperintensity, as a normal finding seen in young patients.  No definite paraspinal or epidural inflammation at those levels.  No suspicious marrow signal elsewhere; intrinsic T1 and T2 hyperintensity at T6 is a benign vertebral body hemangioma.  However, there are layering  pleural effusions left greater than right. Negative visualized upper abdominal viscera.  Epidural lipomatosis throughout the thoracic spine and effaces CSF from the thecal sac from the T5 to the T9 level. No definite spinal cord signal abnormality.  MR LUMBAR SPINE FINDINGS  In the lumbar spine T1-T2 and stir marrow signal appears diminished. This is also true in the visible pelvis. Possible associated decreased T2 signal in both the liver and spleen.  Otherwise negative visualized abdominal viscera.  There is mildly increased STIR signal in the lower lumbar erector spinae muscles bilaterally, at L4-L5 (series 17, images 9 and 1). Subcutaneous edema incidentally noted.  Normal lumbar vertebral height and alignment and no marrow edema or acute osseous abnormality in the lumbar spine.  No definite signal abnormality in the lower thoracic spinal cord, conus at L1-L2.  Lumbar facet hypertrophy.  No lumbar spinal stenosis.  IMPRESSION: MR THORACIC SPINE IMPRESSION  1. Abnormal marrow edema in the T3 and T4 vertebral bodies highly suspicious for acute osteomyelitis in this setting. No associated epidural or paraspinal inflammation identified. 2. No other acute findings in the thoracic spine. Epidural lipomatosis. 3. Left greater than right layering pleural effusions.  MR LUMBAR SPINE IMPRESSION  1. Mild increased signal in the L4-L5 level erector spinae muscles bilaterally, which could be infectious myositis but also this appearance can be seen without infection in chronically debilitated patients. 2. No other acute finding in the lumbar spine. 3. Decreased bone marrow signal suggestive of red marrow conversion versus hemosiderosis.  Electronically Signed: By: Lars Pinks M.D. On: 01/15/2015 14:20   Mr Lumbar Spine Wo Contrast  01/15/2015   ADDENDUM REPORT: 01/15/2015 14:40  ADDENDUM: Study discussed by telephone with Dr. Rhina Brackett DAM on 01/15/2015 at 1435 hrs.   Electronically Signed   By: Lars Pinks M.D.   On:  01/15/2015 14:40   01/15/2015   CLINICAL DATA:  35 year old male with sepsis and back pain. Into card itis, purulent pericarditis, multiple small embolic infarcts. Initial encounter.  EXAM: MRI THORACIC AND LUMBAR SPINE WITHOUT CONTRAST  TECHNIQUE: Multiplanar and multiecho pulse sequences of the thoracic and lumbar spine were obtained without intravenous contrast.  COMPARISON:  Chest radiographs 01/14/2015. CT Abdomen and Pelvis 05/18/2013.  FINDINGS: MR THORACIC SPINE FINDINGS  Limited sagittal imaging of the cervical spine is unremarkable.  Preserved thoracic vertebral height and alignment, preserved thoracic vertebral height and alignment.  Throughout much of the T3 and T4 vertebral bodies there is heterogeneous T2 signal as well as patchy pre STIR signal. The STIR images were repeated, an the same patchy increased signal persisted at T3 and T4.  No definite signal abnormality in the intervening disc, or adjacent disc spaces; there is widespread intervertebral disc T2 hyperintensity, as a normal finding seen in young patients.  No definite paraspinal or epidural inflammation at those levels.  No suspicious marrow signal elsewhere; intrinsic T1 and T2 hyperintensity at T6 is a benign vertebral body hemangioma.  However, there are layering pleural effusions left greater than right. Negative visualized upper abdominal viscera.  Epidural lipomatosis throughout the thoracic spine and effaces CSF from the thecal sac from the T5 to the T9 level. No definite spinal cord signal abnormality.  MR LUMBAR SPINE FINDINGS  In the lumbar spine T1-T2 and stir marrow signal appears diminished. This is also true in the visible pelvis. Possible associated decreased T2 signal in both the liver and spleen.  Otherwise negative visualized abdominal viscera.  There is mildly increased STIR signal in the lower lumbar erector spinae muscles bilaterally, at L4-L5 (series 17, images 9 and 1). Subcutaneous edema incidentally noted.  Normal  lumbar vertebral height and alignment and no marrow edema or acute osseous abnormality in the lumbar spine.  No definite signal abnormality in the lower thoracic spinal cord, conus at L1-L2.  Lumbar facet hypertrophy.  No lumbar spinal stenosis.  IMPRESSION: MR THORACIC SPINE IMPRESSION  1. Abnormal marrow edema in the T3 and T4 vertebral bodies highly suspicious for acute osteomyelitis in this setting. No associated epidural or paraspinal inflammation identified. 2. No other acute findings in the thoracic spine. Epidural lipomatosis. 3. Left greater than right layering pleural effusions.  MR LUMBAR SPINE IMPRESSION  1. Mild increased signal in the L4-L5 level erector spinae muscles bilaterally, which could be infectious myositis but also this appearance can be seen without infection in  chronically debilitated patients. 2. No other acute finding in the lumbar spine. 3. Decreased bone marrow signal suggestive of red marrow conversion versus hemosiderosis.  Electronically Signed: By: Lars Pinks M.D. On: 01/15/2015 14:20   Mr Tibia Fibula Right Wo Contrast  01/15/2015   CLINICAL DATA:  Diabetic patient with bacteremia and right leg pain.  EXAM: MRI OF LOWER RIGHT EXTREMITY WITHOUT CONTRAST  TECHNIQUE: Multiplanar, multisequence MR imaging of the right lower leg was performed. No intravenous contrast was administered.  COMPARISON:  None.  FINDINGS: The axial and coronal sequences incidentally include the left lower leg. There is marked subcutaneous edema of both lower legs which appears slightly worse on the left. No focal subcutaneous fluid collection is identified. Mildly increased T2 signal is seen in the gastrocnemius, soleus and and tibialis posterior muscles bilaterally which appears symmetric. No intramuscular fluid collection is identified. No bone marrow signal abnormality to suggest osteomyelitis is seen. There is no muscle or tendon tear.  IMPRESSION: Intense bilateral lower leg subcutaneous edema appears  worse on the left and could be due to dependent change or cellulitis.  Mildly increased T2 signal in the gastrocnemius, soleus and tibialis posterior muscles bilaterally could be due to myositis or early changes of denervation atrophy in this diabetic patient.  Negative for abscess or osteomyelitis.   Electronically Signed   By: Inge Rise M.D.   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 Problems:   Tobacco abuse   Noncompliance   Dehydration   Diarrhea   Diabetes type 2, uncontrolled   Hyperglycemia   Uncontrolled hypertension   Diabetic foot ulcers   Pain   Diabetic ulcer of left foot associated with type 2 diabetes mellitus   Diabetic ulcer of right foot associated with type 2 diabetes mellitus   Osteomyelitis   Shoulder pain   Thigh pain, musculoskeletal   Septic shock   Staphylococcus aureus bacteremia with sepsis   Smoker   Elevated troponin   Acute renal failure   Bacteremia   Septic joint of right shoulder region   Infective myositis of left thigh   Purulent pericarditis   Mitral regurgitation   Endocarditis   Back pain   Poor dentition    Dwayne [name] is a 35 y.o. 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, PERFORATION, SEVERE MR,  POSSIBLE PURULENT PERICARDITIS with SEPTIC EMBOLI TO CNS,  RIGHT SEPTIC SHOULDER,, THORACIC SPINE DISCITIS< shock renal failure positive troponins left vastus medialis myositis by MRI  #1      [facility] Staphylococcus aureus bacteremia   Staphylococcus aureus bacteremia (SAB) is associated with a high rate of complications and mortality.  Specific aspects of clinical management are critical to optimizing the outcome of patients with SAB.  Therefore, the [facility] Antimicrobial Management Team [facility]) has initiated an intervention aimed at improving the management of SAB at [facility].  To  do so, Infectious Diseases physicians are providing an evidence-based consult for the management of all patients with SAB.     Yes No Comments  Perform follow-up blood cultures (even if the patient is afebrile) to ensure clearance of bacteremia [x]  []  REPEAT BLOOD CULTURES NO GROWTH SO FAR  Remove vascular catheter and obtain follow-up blood cultures after the removal of the catheter []  []   do not place central access until we have cleared his bacteremia   Perform echocardiography to evaluate for endocarditis (  transthoracic ECHO is 40-50% sensitive, TEE is > 90% sensitive) [x]  []  TEE SHOWS infectious aneurysm and perforation of the base of the medial scallop of the posterior mitral leaflet. There is an associated 4x6 mm vegetation. There is severe mitral insufficiency through the perforation in the posterior leaflet with reversal of flow in both the left and right pulmonary veins  Would consider repeat TTE to reassess heart fxn in next week         Ensure source control []  []  56k wbc with 94% PMN on shoulder aspirate after SIX days of antibiotics, no crystals = SEPTIC SHOULDER  Greatly appreciate Dr. Trevor Mace help with this case and agree on being careful with re to operative risk  I AM NOT confident that we will control shoulder infection with apirations and abx alone  With re to his legs strange that his exam findings are medial and MRI lateral  I would recommend getting MRI of BOTH THIGHS one week after last scan  He has been seen also by Dr. Roxy Manns and Dr. Scot Dock from [facility] and GREATLY appreciate their help in addition to Dr. Ninfa Linden     Investigate for "metastatic" sites of infection []  []  MRI of brain has shown septic emboli MRI T and L spine shown T spine discitis   Change antibiotic therapy to  NAFCILLIN SINCE ANCEF DOES NOT PENETRATE CNS AND HE HAS SEPTIC EMBOLI  []  []  Beta-lactam antibiotics are preferred for MSSA due to higher cure rates.   If on Vancomycin, goal  trough should be 15 - 20 mcg/mL  Estimated duration of IV antibiotic therapy:     8 WEEKS POST HEART SURGERY ALL OTHER POTENTIAL SURGERIES  []  []  Consult case management for probably prolonged outpatient IV antibiotic therapy   Dr. Baxter Flattery will take over service tomorrow.     LOS: 7 days   Alcide Evener 01/16/2015, 12:52 PM

## 2015-01-16 NOTE — Progress Notes (Signed)
[facility] PROGRESS NOTE  Ishmeal Rorie VWU:[id number] DOB: [birth year] DOA: 01/09/2015 PCP: No PCP Per Patient    Brief narrative: Dwayne [name] is a 35 y.o. male with a history of Uncontrolled HTN, DM2 who presents to the ED with complaints of diarrhea and fevers and chills x 24 hours. He reports having weakness and fatigue and He denies any nausea or vomiting. No one is sick at home, and he denies taking any recent antibiotic therapy. He reports that he has been out of his medications for months, and has not seen his PCP in months as well. 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    HPI/Subjective: Sleepy, not very communicative. Has pain only in his left thigh and mid back. Has been able to ambulate. No other complaints. I discussed the need to increase insulin today.   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 Algis Liming recommends 8 weeks of antibiotics post heart surgery and all other potential surgeries - seen by Dr. Barry Dienes with CT surgery-- He would prefer the bacteremia to clear up first, however, he is concerned about the degree his MR worsened in 2 days between TTE and TEE. May need earlier surgery his start to have HF symptom. - per Dr. Cornelius Moras, ideally need cardiac cath- Cr clearance prohibitive of this at this time  HTN - BP poorly controlled - increase Lopressor to 50 bid and add oral hydralazine  Poorly controlled diabetes Hemoglobin A1c 8.1 Patient admits to being noncompliant with medications Increase 70/30 today to achieve better control As he is on 70/30, he should technically need a sliding scale at lunch only rather than TID - have modified this   Acute renal failure - improving -  baseline appears to be 1.1 to 1.2  Right shoulder- septic arthritis/ synovitis  - s/p aspiration by IR -ID team recommended I and D to resolve infection -  medical management recommended by ortho as taking him to the OR with his currently infected valve would be too high risk  Acute osteomyelitis of T3T4 - noted on MRI to have increased signal which may be osteomyelitis  Myositis L4-L5 area and entire left leg  - noted on MRI  Septic emboli to the brain  Diarrhea C. difficile negative   Code Status: full Family Communication: family at bedside Disposition Plan:  As above   Consultants:  None  Procedures:  None  Antibiotics: Anti-infectives    Start     Dose/Rate Route Frequency Ordered Stop   01/15/15 1600  nafcillin 2 g in dextrose 5 % 50 mL IVPB  Status:  Discontinued     2 g100 mL/hr over 30 Minutes Intravenous 6 times per day 01/15/15 1503 01/15/15 1517   01/15/15 1600  nafcillin 2 g in dextrose 5 % 50 mL IVPB     2 g100 mL/hr over 30 Minutes Intravenous 6 times per day 01/15/15 1517     01/15/15 0930  nafcillin injection 2 g  Status:  Discontinued     2 g Intravenous Every 4 hours 01/15/15 0921 01/15/15 1503   01/14/15 1500  DAPTOmycin (CUBICIN) 600 mg in sodium chloride 0.9 % IVPB  Status:  Discontinued     600 mg224 mL/hr over 30 Minutes Intravenous Every 48 hours 01/12/15 1421 01/13/15  1239   01/13/15 1500  ceFAZolin (ANCEF) IVPB 2 g/50 mL premix  Status:  Discontinued     2 g100 mL/hr over 30 Minutes Intravenous 3 times per day 01/13/15 1407 01/15/15 0921   01/12/15 1600  ceFAZolin (ANCEF) IVPB 2 g/50 mL premix  Status:  Discontinued     2 g100 mL/hr over 30 Minutes Intravenous Every 12 hours 01/12/15 1419 01/13/15 1407   01/11/15 1200  DAPTOmycin (CUBICIN) 600 mg in sodium chloride 0.9 % IVPB  Status:  Discontinued     600 mg224 mL/hr over 30 Minutes Intravenous Every 24 hours 01/11/15 1003 01/12/15 1421   01/10/15 1200  vancomycin (VANCOCIN) IVPB  1000 mg/200 mL premix  Status:  Discontinued     1,000 mg200 mL/hr over 60 Minutes Intravenous Every 8 hours 01/10/15 1044 01/11/15 0947   01/10/15 1100  piperacillin-tazobactam (ZOSYN) IVPB 3.375 g  Status:  Discontinued     3.375 g12.5 mL/hr over 240 Minutes Intravenous Every 8 hours 01/10/15 1043 01/11/15 0902   01/09/15 0700  metroNIDAZOLE (FLAGYL) IVPB 500 mg  Status:  Discontinued     500 mg100 mL/hr over 60 Minutes Intravenous Every 8 hours 01/09/15 0651 01/10/15 1018       Objective: Filed Vitals:   01/15/15 0945 01/15/15 1523 01/15/15 2015 01/16/15 0425  BP: 157/84 153/87 154/81 167/90  Pulse: 94 86 93 81  Temp:  99.4 F (37.4 C) 98.7 F (37.1 C) 98.8 F (37.1 C)  TempSrc:  Oral Oral Oral  Resp:  16 16 16   Height:      Weight:      SpO2:  97% 96% 100%    Intake/Output Summary (Last 24 hours) at 01/16/15 0824 Last data filed at 01/16/15 0743  Gross per 24 hour  Intake   1905 ml  Output   1910 ml  Net     -5 ml    Exam:  General: alert & oriented x 3 In NAD  Cardiovascular: RRR, nl S1 s2  Respiratory: Decreased breath sounds at the bases, scattered rhonchi, no crackles  Abdomen: soft +BS NT/ND, no masses palpable  Extremities: No cyanosis and no edema      Data Reviewed: Basic Metabolic Panel:  Recent Labs Lab 01/10/15 1121  01/11/15 0953 01/12/15 0614 01/13/15 0503 01/14/15 0518 01/15/15 1540 01/16/15 0509  NA  --   < >  --  129* 134* 131* 135 135  K  --   < >  --  3.0* 3.1* 3.0* 3.3* 3.6  CL  --   < >  --  98 101 102 104 104  CO2  --   < >  --  21 22 23 22 23   GLUCOSE  --   < >  --  97 123* 241* 206* 294*  BUN  --   < >  --  34* 37* 33* 27* 23  CREATININE  --   < >  --  3.15* 3.05* 2.66* 2.19* 1.99*  CALCIUM  --   < >  --  7.5* 8.0* 7.7* 7.9* 7.5*  MG 1.6  --  1.9  --  2.3  --   --   --   < > = values in this interval not displayed.  Liver Function Tests:  Recent Labs Lab 01/12/15 0614 01/13/15 0503 01/14/15 0518 01/15/15 1540  01/16/15 0509  AST 25 23 23  40* 42*  ALT 23 21 14 14 19   ALKPHOS 81 151* 211* 263* 233*  BILITOT 0.6  0.2* 0.3 0.3 0.9  PROT 5.0* 5.7* 5.6* 6.0 5.5*  ALBUMIN 1.4* 1.4* 1.3* 1.3* 1.2*   No results for input(s): LIPASE, AMYLASE in the last 168 hours. No results for input(s): AMMONIA in the last 168 hours.  CBC:  Recent Labs Lab 01/12/15 0614 01/13/15 0503 01/14/15 0518 01/15/15 1540 01/16/15 0509  WBC 13.4* 13.4* 10.8* 12.4* 12.2*  HGB 10.3* 10.6* 9.9* 9.8* 9.1*  HCT 30.8* 31.4* 29.1* 29.3* 27.0*  MCV 82.1 82.4 83.1 83.5 83.6  PLT 169 243 264 411* 372    Cardiac Enzymes:  Recent Labs Lab 01/10/15 2036 01/11/15 0235 01/12/15 0614 01/12/15 1130 01/12/15 1849  CKTOTAL  --   --  49  --   --   TROPONINI 0.33* 0.28* 0.84* 0.65* 0.47*   BNP (last 3 results) No results for input(s): PROBNP in the last 8760 hours.   CBG:  Recent Labs Lab 01/15/15 0422 01/15/15 0747 01/15/15 1651 01/15/15 2135 01/16/15 0622  GLUCAP 179* 219* 198* 184* 258*    Imaging results reviewed in detail.    Scheduled Meds: . chlorhexidine  15 mL Mouth/Throat BID  . feeding supplement (GLUCERNA SHAKE)  237 mL Oral BID BM  . feeding supplement (PRO-STAT SUGAR FREE 64)  30 mL Oral Q1500  . Influenza vac split quadrivalent PF  0.5 mL Intramuscular Tomorrow-1000  . insulin aspart  0-15 Units Subcutaneous TID WC  . insulin aspart  0-5 Units Subcutaneous QHS  . insulin aspart protamine- aspart  25 Units Subcutaneous BID WC  . living well with diabetes book   Does not apply Once  . metoprolol tartrate  25 mg Oral BID  . nafcillin IV  2 g Intravenous 6 times per day  . neomycin-bacitracin-polymyxin   Topical Daily  . nicotine  7 mg Transdermal Daily  . sodium chloride  500 mL Intravenous Once   Continuous Infusions: . 0.9 % NaCl with KCl 40 mEq / L 100 mL/hr at 01/16/15 0038    Time spent: 40 minutes   [facility]  [facility] Pager-  [email or web address], password [facility] 01/16/2015,  8:24 AM  LOS: 7 days

## 2015-01-16 NOTE — Progress Notes (Signed)
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 Problems:   Tobacco abuse   Noncompliance   Dehydration   Diarrhea   Diabetes type 2, uncontrolled   Hyperglycemia   Uncontrolled hypertension   Diabetic foot ulcers   Pain   Diabetic ulcer of left foot associated with type 2 diabetes mellitus   Diabetic ulcer of right foot associated with type 2 diabetes mellitus   Osteomyelitis   Shoulder pain   Thigh pain, musculoskeletal   Septic shock   Staphylococcus aureus bacteremia with sepsis   Smoker   Elevated troponin   Acute renal failure   Bacteremia   Septic joint of right shoulder region   Infective myositis of left thigh   Purulent pericarditis   Mitral regurgitation   Endocarditis   Back pain   Poor dentition    SUBJECTIVE  Mild shoulder pain.  No new complaints. Sleepy.   CURRENT MEDS . chlorhexidine  15 mL Mouth/Throat BID  . feeding supplement (GLUCERNA SHAKE)  237 mL Oral BID BM  . feeding supplement (PRO-STAT SUGAR FREE 64)  30 mL Oral Q1500  . hydrALAZINE  25 mg Oral 3 times per day  . Influenza vac split quadrivalent PF  0.5 mL Intramuscular Tomorrow-1000  . insulin aspart  0-15 Units Subcutaneous QAC lunch  . insulin aspart  0-5 Units Subcutaneous QHS  . insulin aspart protamine- aspart  25 Units Subcutaneous BID WC  . living well with diabetes book   Does not apply Once  . metoprolol tartrate  50 mg Oral BID  . nafcillin IV  2 g Intravenous 6 times per day  . neomycin-bacitracin-polymyxin   Topical Daily  . nicotine  7 mg Transdermal Daily  . sodium chloride  500 mL Intravenous Once    OBJECTIVE  Filed Vitals:   01/15/15 1523 01/15/15 2015 01/16/15 0425 01/16/15 1018  BP: 153/87 154/81 167/90 160/98  Pulse: 86 93 81 88  Temp: 99.4 F (37.4 C) 98.7 F (37.1 C) 98.8 F (37.1 C)   TempSrc: Oral Oral Oral   Resp: 16 16 16    Height:      Weight:      SpO2:  97% 96% 100%     Intake/Output Summary (Last 24 hours) at 01/16/15 1031 Last data filed at 01/16/15 0743  Gross per 24 hour  Intake   1665 ml  Output   1650 ml  Net     15 ml   Filed Weights   01/09/15 0017 01/09/15 0329  Weight: 220 lb (99.791 kg) 226 lb 6.4 oz (102.694 kg)    PHYSICAL EXAM  General: Pleasant, NAD. Neuro: Alert and oriented X 3. Moves all extremities spontaneously. Psych: Normal affect. HEENT:  Normal  Neck: Supple without bruits or JVD. Lungs:  Resp regular and unlabored, anterior exam CTA. Heart: RRR no s3, s4, soft HSM apex. +RUB  Abdomen: Soft, non-tender, non-distended, BS + x 4.  Extremities: No clubbing, cyanosis or edema. LLE tender  Accessory Clinical Findings  CBC  Recent Labs  01/15/15 1540 01/16/15 0509  WBC 12.4* 12.2*  HGB 9.8* 9.1*  HCT 29.3* 27.0*  MCV 83.5 83.6  PLT 411* 372   Basic Metabolic Panel  Recent Labs  01/15/15 1540 01/16/15 0509  NA 135 135  K 3.3* 3.6  CL 104 104  CO2 22 23  GLUCOSE 206* 294*  BUN 27* 23  CREATININE 2.19* 1.99*  CALCIUM  7.9* 7.5*   Liver Function Tests  Recent Labs  01/15/15 1540 01/16/15 0509  AST 40* 42*  ALT 14 19  ALKPHOS 263* 233*  BILITOT 0.3 0.9  PROT 6.0 5.5*  ALBUMIN 1.3* 1.2*   Cardiac Enzymes No results for input(s): CKTOTAL, CKMB, CKMBINDEX, TROPONINI in the last 72 hours. Hemoglobin A1C  Recent Labs  01/14/15 0518  HGBA1C 8.4*    TELE NSR with HR 80-90s    ECG  No new EKG  TEE 01/13/2015  LV EF: 60% -  65%  ------------------------------------------------------------------- Study Conclusions  - Left ventricle: The cavity size was normal. There was moderate concentric hypertrophy. Systolic function was normal. The estimated ejection fraction was in the range of 60% to 65%. Wall motion was normal; there were no regional wall motion abnormalities. - Aortic valve: No evidence of vegetation. 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 There is an associated 4x6 mm vegetation. There is severe mitral insufficiency through the perforation in the posterior leaflet with reversal of flow in both the left and right pulmonary veins. - Left atrium: The atrium was mildly dilated. No evidence of thrombus in the atrial cavity or appendage. No spontaneous echo contrast was observed. - Right atrium: No evidence of thrombus in the atrial cavity or appendage. No evidence of thrombus in the atrial cavity or appendage. - Atrial septum: No defect or patent foramen ovale was identified. - Tricuspid valve: No evidence of vegetation. - Pulmonic valve: No evidence of vegetation. - Pericardium, extracardiac: A small pericardial effusion was identified circumferential to the heart. The fluid exhibited a fibrinous appearance.There was no evidence of hemodynamic compromise.     Radiology/Studies  Dg Orthopantogram  01/14/2015   CLINICAL DATA:  Staphylococcal endocarditis.  Poor dentition.  EXAM: ORTHOPANTOGRAM/PANORAMIC  COMPARISON:  None.  FINDINGS: Extensive dental disease with caries involving multiple teeth, including numbers 7, 8, 9, 10, 11, 12, 13, 14, 15, 16, 19, 20, 29, 30, 31 and 32. Periapical lucencies are present adjacent to teeth numbers 29 and 30.  IMPRESSION: Extensive dental disease as described.   Electronically Signed   By: Hulan Saas M.D.   On: 01/14/2015 21:31   Dg Chest 2 View  01/14/2015   CLINICAL DATA:  Right shoulder pain for 6 days.  EXAM: CHEST  2 VIEW  COMPARISON:  01/10/2015  FINDINGS: Two views of the chest demonstrate low lung volumes with bibasilar chest densities, left side greater than right. In addition, the heart appears to be enlarged. The configuration of the heart raises the possibility for pericardial fluid. No evidence for pulmonary edema. Difficult to exclude small effusions, particularly on the  left side.  IMPRESSION: Low lung volumes with bibasilar densities, left side greater than right. Basilar densities could represent a combination of atelectasis and pleural fluid.  Heart appears enlarged which may be accentuated by low lung volumes. Pericardial fluid cannot be excluded.   Electronically Signed   By: Richarda Overlie M.D.   On: 01/14/2015 08:17   Dg Shoulder Right  01/09/2015   CLINICAL DATA:  Anterior right shoulder pain  EXAM: RIGHT SHOULDER - 2+ VIEW  COMPARISON:  None.  FINDINGS: No fracture or dislocation is seen.  The joint spaces are preserved.  The visualized soft tissues are unremarkable.  Visualized right lung is clear.  IMPRESSION: No fracture or dislocation is seen.   Electronically Signed   By: Roselie Awkward.D.  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  EXAM: MRI HEAD WITHOUT CONTRAST  TECHNIQUE: Multiplanar, multiecho pulse sequences of the brain and surrounding structures were obtained without intravenous contrast.  COMPARISON:  Prior study from 04/25/2012  FINDINGS: Cerebral volume within normal limits for patient age. No significant chronic small vessel ischemic changes identified. No mass lesion, midline shift, or mass effect. Ventricles are normal in size without evidence of hydrocephalus. No extra-axial fluid collection.  There are multiple subcentimeter ischemic infarcts involving predominantly the supratentorial brain. These involve both frontal lobes, the right parietal lobe, left temporal lobe, ischemic infarct within the right is cerebral hemisphere is located in the anterior right frontal lobe and measures 5 mm (series 3, image 36). The largest infarct in the left cerebral hemisphere is located in the deep white matter of the anterior left frontal lobe and measures 13 mm (series 3, image 30). There are infarcts involving the bilateral basal ganglia, the most  prominent of which located in the right thalamus which measures 4 mm. There is a single infratentorial infarct within the left cerebellar hemisphere measuring 4 mm. These are most likely embolic in nature given the history of endocarditis and multiple vascular distributions. Scattered susceptibility artifact seen associated with a few of these infarcts, likely reflecting small petechial hemorrhages. No frank hemorrhagic conversion There is associated T2/FLAIR signal intensity without significant mass effect.  Craniocervical junction within normal limits. Pituitary gland normal.  No acute abnormality seen about the orbits.  Paranasal sinuses are clear.  No mastoid effusion.  Bone marrow signal intensity within normal limits. Visualized upper cervical spine unremarkable.  Scalp soft tissues within normal limits.  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 These are likely embolic in nature given the history of endocarditis and involvement of multiple vascular distributions. No significant mass effect. There are likely scattered small petechial hemorrhages with a number these infarcts without frank hemorrhage.   Electronically Signed   By: Rise MuBenjamin  McClintock M.D.   On: 01/14/2015 21:24   Mr Thoracic Spine Wo Contrast  01/15/2015   ADDENDUM REPORT: 01/15/2015 14:40  ADDENDUM: Study discussed by telephone with Dr. Paulette BlanchORNELIUS VAN DAM on 01/15/2015 at 1435 hrs.   Electronically Signed   By: Augusto GambleLee  Hall M.D.   On: 01/15/2015 14:40   01/15/2015   CLINICAL DATA:  35 year old male with sepsis and back pain. Into card itis, purulent pericarditis, multiple small embolic infarcts. Initial encounter.  EXAM: MRI THORACIC AND LUMBAR SPINE WITHOUT CONTRAST  TECHNIQUE: Multiplanar and multiecho pulse sequences of the thoracic and lumbar spine were obtained without intravenous contrast.  COMPARISON:  Chest radiographs 01/14/2015. CT Abdomen and Pelvis 05/18/2013.   FINDINGS: MR THORACIC SPINE FINDINGS  Limited sagittal imaging of the cervical spine is unremarkable.  Preserved thoracic vertebral height and alignment, preserved thoracic vertebral height and alignment.  Throughout much of the T3 and T4 vertebral bodies there is heterogeneous T2 signal as well as patchy pre STIR signal. The STIR images were repeated, an the same patchy increased signal persisted at T3 and T4.  No definite signal abnormality in the intervening disc, or adjacent disc spaces; there is widespread intervertebral disc T2 hyperintensity, as a normal finding seen in young patients.  No definite paraspinal or epidural inflammation at those levels.  No suspicious marrow signal elsewhere; intrinsic T1 and T2 hyperintensity at T6 is a benign vertebral  body hemangioma.  However, there are layering pleural effusions left greater than right. Negative visualized upper abdominal viscera.  Epidural lipomatosis throughout the thoracic spine and effaces CSF from the thecal sac from the T5 to the T9 level. No definite spinal cord signal abnormality.  MR LUMBAR SPINE FINDINGS  In the lumbar spine T1-T2 and stir marrow signal appears diminished. This is also true in the visible pelvis. Possible associated decreased T2 signal in both the liver and spleen.  Otherwise negative visualized abdominal viscera.  There is mildly increased STIR signal in the lower lumbar erector spinae muscles bilaterally, at L4-L5 (series 17, images 9 and 1). Subcutaneous edema incidentally noted.  Normal lumbar vertebral height and alignment and no marrow edema or acute osseous abnormality in the lumbar spine.  No definite signal abnormality in the lower thoracic spinal cord, conus at L1-L2.  Lumbar facet hypertrophy.  No lumbar spinal stenosis.  IMPRESSION: MR THORACIC SPINE IMPRESSION  1. Abnormal marrow edema in the T3 and T4 vertebral bodies highly suspicious for acute osteomyelitis in this setting. No associated epidural or paraspinal  inflammation identified. 2. No other acute findings in the thoracic spine. Epidural lipomatosis. 3. Left greater than right layering pleural effusions.  MR LUMBAR SPINE IMPRESSION  1. Mild increased signal in the L4-L5 level erector spinae muscles bilaterally, which could be infectious myositis but also this appearance can be seen without infection in chronically debilitated patients. 2. No other acute finding in the lumbar spine. 3. Decreased bone marrow signal suggestive of red marrow conversion versus hemosiderosis.  Electronically Signed: By: Augusto [name] M.D. On: 01/15/2015 14:20   Mr Lumbar Spine Wo Contrast  01/15/2015   ADDENDUM REPORT: 01/15/2015 14:40  ADDENDUM: Study discussed by telephone with Dr. Paulette Blanch DAM on 01/15/2015 at 1435 hrs.   Electronically Signed   By: Augusto [name] M.D.   On: 01/15/2015 14:40   01/15/2015   CLINICAL DATA:  35 year old male with sepsis and back pain. Into card itis, purulent pericarditis, multiple small embolic infarcts. Initial encounter.  EXAM: MRI THORACIC AND LUMBAR SPINE WITHOUT CONTRAST  TECHNIQUE: Multiplanar and multiecho pulse sequences of the thoracic and lumbar spine were obtained without intravenous contrast.  COMPARISON:  Chest radiographs 01/14/2015. CT Abdomen and Pelvis 05/18/2013.  FINDINGS: MR THORACIC SPINE FINDINGS  Limited sagittal imaging of the cervical spine is unremarkable.  Preserved thoracic vertebral height and alignment, preserved thoracic vertebral height and alignment.  Throughout much of the T3 and T4 vertebral bodies there is heterogeneous T2 signal as well as patchy pre STIR signal. The STIR images were repeated, an the same patchy increased signal persisted at T3 and T4.  No definite signal abnormality in the intervening disc, or adjacent disc spaces; there is widespread intervertebral disc T2 hyperintensity, as a normal finding seen in young patients.  No definite paraspinal or epidural inflammation at those levels.  No suspicious marrow  signal elsewhere; intrinsic T1 and T2 hyperintensity at T6 is a benign vertebral body hemangioma.  However, there are layering pleural effusions left greater than right. Negative visualized upper abdominal viscera.  Epidural lipomatosis throughout the thoracic spine and effaces CSF from the thecal sac from the T5 to the T9 level. No definite spinal cord signal abnormality.  MR LUMBAR SPINE FINDINGS  In the lumbar spine T1-T2 and stir marrow signal appears diminished. This is also true in the visible pelvis. Possible associated decreased T2 signal in both the liver and spleen.  Otherwise negative visualized abdominal viscera.  There  is mildly increased STIR signal in the lower lumbar erector spinae muscles bilaterally, at L4-L5 (series 17, images 9 and 1). Subcutaneous edema incidentally noted.  Normal lumbar vertebral height and alignment and no marrow edema or acute osseous abnormality in the lumbar spine.  No definite signal abnormality in the lower thoracic spinal cord, conus at L1-L2.  Lumbar facet hypertrophy.  No lumbar spinal stenosis.  IMPRESSION: MR THORACIC SPINE IMPRESSION  1. Abnormal marrow edema in the T3 and T4 vertebral bodies highly suspicious for acute osteomyelitis in this setting. No associated epidural or paraspinal inflammation identified. 2. No other acute findings in the thoracic spine. Epidural lipomatosis. 3. Left greater than right layering pleural effusions.  MR LUMBAR SPINE IMPRESSION  1. Mild increased signal in the L4-L5 level erector spinae muscles bilaterally, which could be infectious myositis but also this appearance can be seen without infection in chronically debilitated patients. 2. No other acute finding in the lumbar spine. 3. Decreased bone marrow signal suggestive of red marrow conversion versus hemosiderosis.  Electronically Signed: By: Augusto [name] M.D. On: 01/15/2015 14:20   US Renal  01/12/2015   CLINICAL DATA:  Acute renal failure  EXAM: RENAL/URINARY TRACT ULTRASOUND  COMPLETE  COMPARISON:  None.  FINDINGS: Right Kidney:  Length: 12.8 cm in length. Diffuse increased cortical echogenicity suspicious for medical renal disease. No hydronephrosis. No renal calculus.  Left Kidney:  Length: . 12.6 cm in length. Diffuse increased echogenicity suspicious for medical renal disease. No hydronephrosis. No diagnostic renal calculus.  Bladder:  Appears normal for degree of bladder distention.  IMPRESSION: Bilateral kidneys shows diffuse increased echogenicity. Medical renal disease cannot be excluded. No hydronephrosis or diagnostic renal calculus.   Electronically Signed   By: Natasha Mead M.D.   On: 01/12/2015 16:08   Mr Shoulder Right Wo Contrast  01/11/2015   CLINICAL DATA:  Right shoulder pain.  EXAM: MRI OF THE RIGHT SHOULDER WITHOUT CONTRAST  TECHNIQUE: Multiplanar, multisequence MR imaging of the shoulder was performed. No intravenous contrast was administered.  COMPARISON:  Radiographs dated 01/09/2015  FINDINGS: Rotator cuff:  Normal.  Muscles:  Normal.  Biceps long head: Properly located and intact. There is inflammation in the soft tissues around the bicipital tendon sheath with fluid in the bicipital tendon sheath.  Acromioclavicular Joint: Small degenerative cyst in the acromion. Otherwise normal AC joint. Type 1 acromion. No bursitis.  Glenohumeral Joint: There is a moderate glenohumeral joint effusion with extravasation of contrast from the subcoracoid recess of the joint into the adjacent soft tissues. Fluid distends the bicipital tendon sheath.  Labrum:  Intact.  Bones: No significant abnormalities. Tiny degenerative cystic areas in the posterior aspect of the greater tuberosity of the proximal humerus.  IMPRESSION: 1. Moderate glenohumeral joint effusion with some extravasation of joint. Fluid distends the bicipital tendon sheath with inflammation/extravasated joint fluid around the bicipital tendon sheath. The possibly of synovitis should be considered. 2. Normal rotator  cuff.   Electronically Signed   By: Geanie Cooley M.D.   On: 01/11/2015 11:21   Mr Tibia Fibula Right Wo Contrast  01/15/2015   CLINICAL DATA:  Diabetic patient with bacteremia and right leg pain.  EXAM: MRI OF LOWER RIGHT EXTREMITY WITHOUT CONTRAST  TECHNIQUE: Multiplanar, multisequence MR imaging of the right lower leg was performed. No intravenous contrast was administered.  COMPARISON:  None.  FINDINGS: The axial and coronal sequences incidentally include the left lower leg. There is marked subcutaneous edema of both lower legs which appears slightly  worse on the left. No focal subcutaneous fluid collection is identified. Mildly increased T2 signal is seen in the gastrocnemius, soleus and and tibialis posterior muscles bilaterally which appears symmetric. No intramuscular fluid collection is identified. No bone marrow signal abnormality to suggest osteomyelitis is seen. There is no muscle or tendon tear.  IMPRESSION: Intense bilateral lower leg subcutaneous edema appears worse on the left and could be due to dependent change or cellulitis.  Mildly increased T2 signal in the gastrocnemius, soleus and tibialis posterior muscles bilaterally could be due to myositis or early changes of denervation atrophy in this diabetic patient.  Negative for abscess or osteomyelitis.   Electronically Signed   By: Drusilla Kanner M.D.   On: 01/15/2015 08:28   Mr Foot Right Wo Contrast  01/11/2015   CLINICAL DATA:  Diabetic foot ulcers.  EXAM: MRI OF THE RIGHT FOREFOOT WITHOUT CONTRAST  TECHNIQUE: Multiplanar, multisequence MR imaging was performed. No intravenous contrast was administered.  COMPARISON:  Radiographs dated 01/09/2011  FINDINGS: There are small erosions of the proximal phalanx at the IP joint of the great toe and there are small erosions at the first metatarsal phalangeal joint, both consistent with gout. There are no soft tissue abscesses. There is no osteomyelitis. There is nonspecific edema in the  subcutaneous fat of the dorsum of the foot. Muscles and tendons and ligaments appear appear normal. No joint effusions.  IMPRESSION: Subcutaneous edema primarily on the dorsum of the foot. 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   Electronically Signed   By: Geanie Cooley M.D.   On: 01/11/2015 12:50   Mr Femur Left Wo Contrast  01/12/2015   CLINICAL DATA:  Diabetes. Peripheral neuropathy. Sepsis. Bilateral thigh pain. RIGHT thigh pain. Tenderness in the medial aspect of the thigh.  EXAM: MR OF THE LEFT FEMUR WITHOUT CONTRAST  TECHNIQUE: Multiplanar, multisequence MR imaging was performed. No intravenous contrast was administered.  COMPARISON:  None.  FINDINGS: Diffuse subcutaneous edema is present around the pelvis and in both thighs, most compatible with anasarca. This could also represent cellulitis in the appropriate clinical setting. Subcutaneous fluid is present in the lateral LEFT thigh, without a discrete abscess. There is myositis of the LEFT vastus lateralis with mild muscular edema av but no significant swelling. No fatty atrophy.  Bone marrow signal shows heterogenous marrow. This is a nonspecific finding most commonly associated with obesity, anemia, cigarette smoking or chronic disease. Negative for hip fracture or AVN. There is no evidence of osteomyelitis. No deep soft tissue abscesses are present.  Bilaterally in the groin, there are prominent lymph nodes which are probably reactive to lower extremity process.  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  IMPRESSION: 1. Diffuse subcutaneous edema in the pelvis and thighs bilaterally, suggesting anasarca. 2. More prominent subcutaneous edema along the LEFT thigh with subcutaneous fluid superficial to the muscular fascia, likely representing cellulitis. No abscess. 3. Myositis of the proximal vastus lateralis, probably infectious. Reactive edema could produce this appearance as well.  4. Reactive inguinal adenopathy bilaterally. 5. Negative for osteomyelitis.   Electronically Signed   By: Andreas Newport M.D.   On: 01/12/2015 14:33   Mr Foot Left Wo Contrast  01/11/2015   CLINICAL DATA:  Diabetic foot ulcers.  Foot pain.  Bony erosions.  EXAM: MRI OF THE LEFT FOREFOOT WITHOUT CONTRAST  TECHNIQUE: Multiplanar, multisequence MR imaging was performed. No intravenous contrast was administered.  COMPARISON:  Radiograph dated 01/09/2015  FINDINGS: There is slight edema throughout the medial cuneiform. There is artifactual increased signal from the metatarsal heads in from the proximal phalanx of the great toe. There are erosions at IP joint and first metatarsal phalangeal joint consistent with gout.  There are no soft tissue abscesses and there is no osteomyelitis. There is prominent edema in the subcutaneous fat of the dorsum of the foot.  IMPRESSION: No evidence of osteomyelitis or abscess. The edema in the medial cuneiform is most likely a stress reaction. 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   Electronically Signed   By: Geanie Cooley M.D.   On: 01/11/2015 12:15   Dg Chest Port 1 View  01/10/2015   CLINICAL DATA:  Fever and sweats.  EXAM: PORTABLE CHEST - 1 VIEW  COMPARISON:  None.  FINDINGS: Low lung volumes with vascular crowding and streaky subsegmental atelectasis. No obvious infiltrate or effusion. The heart is within normal limits in size given the AP portable technique. The bony structures are grossly normal.  IMPRESSION: Low lung volumes with vascular crowding and bibasilar atelectasis.   Electronically Signed   By: Loralie Champagne M.D.   On: 01/10/2015 11:27   Dg Foot Complete Left  01/09/2015   CLINICAL DATA:  35 year old male with recurrent bilateral foot pain.  EXAM: LEFT FOOT - COMPLETE 3+ VIEW  COMPARISON:  01/05/2014.  FINDINGS: Large periarticular erosions are noted adjacent to the first MTP joint, and at the first interphalangeal joint  along the medial margin in both locations. These have erosions have sclerotic margins and overhanging edges, and there is adjacent soft tissue prominence, suggestive of gouty erosions. No adjacent soft tissue calcification is noted. No acute displaced fracture, subluxation or dislocation. Small heterotopic ossification adjacent to the plantar aspect of the calcaneus, likely ossification within the plantar fascia.  IMPRESSION: 1. Findings, as above, suggestive of underlying gout. Clinical correlation is recommended.   Electronically Signed   By: Trudie Reed M.D.   On: 01/09/2015 09:46   Dg Foot Complete Right  01/09/2015   CLINICAL DATA:  Recurring bilateral foot pain  EXAM: RIGHT FOOT COMPLETE - 3+ VIEW  COMPARISON:  None.  FINDINGS: No fracture or dislocation is seen.  Marginal erosion at the medial aspect of the 1st IP joint, involving the proximal phalanx.  The visualized soft tissues are unremarkable.  IMPRESSION: Marginal erosion at the medial aspect of the 1st IP joint. Correlate for inflammatory arthropathy such as gout.  Otherwise, no acute osseus abnormality is seen.   Electronically Signed   By: Charline Bills M.D.   On: 01/09/2015 09:44   Dg Fluoro Guide Ndl Plc/bx  01/14/2015   CLINICAL DATA:  Right shoulder pain and effusion.  Bacteremia.  EXAM: RIGHT SHOULDER ASPIRATION UNDER FLUOROSCOPY  FLUOROSCOPY TIME:  0 min 24 seconds  PROCEDURE: Overlying skin prepped with Betadine, draped in the usual sterile fashion, and infiltrated locally with buffered Lidocaine. Twenty gauge spinal needle was placed in the right glenohumeral joint under direct fluoroscopic visualization.  2 cc of bloody cloudy fluid was aspirated from the joint. Mr. Benegas tolerated the procedure well.  IMPRESSION: Technically successful right shoulder aspiration under fluoroscopy. Fluid sent for laboratory analysis.   Electronically Signed   By: Geanie Cooley M.D.   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     1. Elevated trop: in the setting of endocarditis, myocardial inflammation.  2. 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  - seen by Dr. Barry Dienes with CT surgery, discussed with Dr. Barry Dienes,  would prefer the bacteremia to clear up first, however we are concerned about the degree his MR worsened in 2 days between TTE and TEE. May need earlier surgery his start to have HF symptom. Also I am concerned about septic emboli.   - Pericarditis, small effusion. Discussed.  - per Dr. Cornelius Moras, ideally need cardiac cath, LE angiography to r/o septic emboli to LLE arterial system, however understandably his renal function is prohibitive of such test now. He will need dental eval prior to surgery. Dr. Cornelius Moras concerned recent AKI may be related to septic emboli as well, nephrology on board.    - if shoulder surgery is necessary, moderate cardiac risk (based on severe MR and underlying endocarditis) for shoulder debridement, and may proceed if benefit felt to outweigh risk . Careful with fluid administration. No current signs of CHF. EF normal. At this time, ortho team recommends med mgt. S/p aspiration.  3. staph bacteremia, MSSA- ID, Nafcillin. Now MRI of spine concerning for osteo.  4. PVD, LE ABI 0.64. Vascular evaluated. No DVT, Monitor.  5. Acute on chronic renal insufficiency: improving, ?septic emboli 6. Hypoalbuminemia 7. Hypokalemia  Highly concerned about his situation, discussed with patient.  May be able to proceed with left heart cath next week to define anatomy as renal function is improving.   Donato Schultz, MD

## 2015-01-16 NOTE — Progress Notes (Signed)
   VASCULAR SURGERY ASSESSMENT & PLAN:  * Venous Duplex shows no evidence of DVT of Left LE.   *  Pt is now on IV Nafcillin  * RENAL: Crt continues to improve.   * No further vascular w/u indicated at this time. We will be available as needed.   SUBJECTIVE: No specific complaints.  PHYSICAL EXAM: Filed Vitals:   01/15/15 0945 01/15/15 1523 01/15/15 2015 01/16/15 0425  BP: 157/84 153/87 154/81 167/90  Pulse: 94 86 93 81  Temp:  99.4 F (37.4 C) 98.7 F (37.1 C) 98.8 F (37.1 C)  TempSrc:  Oral Oral Oral  Resp:  16 16 16   Height:      Weight:      SpO2:  97% 96% 100%   Both feet warm and well perfused.   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 Problems:   Tobacco abuse   Noncompliance   Dehydration   Diarrhea   Diabetes type 2, uncontrolled   Hyperglycemia   Uncontrolled hypertension   Diabetic foot ulcers   Pain   Diabetic ulcer of left foot associated with type 2 diabetes mellitus   Diabetic ulcer of right foot associated with type 2 diabetes mellitus   Osteomyelitis   Shoulder pain   Thigh pain, musculoskeletal   Septic shock   Staphylococcus aureus bacteremia with sepsis   Smoker   Elevated troponin   Acute renal failure   Bacteremia   Septic joint of right shoulder region   Infective myositis of left thigh   Purulent pericarditis   Mitral regurgitation   Endocarditis   Back pain   Poor dentition    Dwayne CarawayChris Ingram [name] Beeper: [phone] 01/16/2015

## 2015-01-16 NOTE — Progress Notes (Addendum)
[facility] ROUNDING NOTE   Subjective:   Interval History: sitting on edge of bed  Sluggish mentation but alert and orientated   Objective:  Vital signs in last 24 hours:  Temp:  [98.7 F (37.1 C)-99.4 F (37.4 C)] 98.8 F (37.1 C) (01/17 0425) Pulse Rate:  [81-93] 88 (01/17 1018) Resp:  [16] 16 (01/17 0425) BP: (153-167)/(81-98) 160/98 mmHg (01/17 1018) SpO2:  [96 %-100 %] 100 % (01/17 0425)  Weight change:  Filed Weights   01/09/15 0017 01/09/15 0329  Weight: 99.791 kg (220 lb) 102.694 kg (226 lb 6.4 oz)    Intake/Output: I/O last 3 completed shifts: In: 2815 [P.O.:2320; I.V.:445; IV Piggyback:50] Out: 2760 [Urine:2760]   Intake/Output this shift:  Total I/O In: 50 [IV Piggyback:50] Out: 300 [Urine:300]  CVS- RRR murmur systolic  RS- CTA ABD- BS present soft non-distended EXT- 1 + edema with plantar sores   Basic Metabolic Panel:  Recent Labs Lab 01/10/15 1121  01/11/15 0953 01/12/15 0614 01/13/15 0503 01/14/15 0518 01/15/15 1540 01/16/15 0509  NA  --   < >  --  129* 134* 131* 135 135  K  --   < >  --  3.0* 3.1* 3.0* 3.3* 3.6  CL  --   < >  --  98 101 102 104 104  CO2  --   < >  --  _0 GLUCOSE  --   < >  --  97 123* 241* 206* 294*  BUN  --   < >  --  34* 37* 33* 27* 23  CREATININE  --   < >  --  3.15* 3.05* 2.66* 2.19* 1.99*  CALCIUM  --   < >  --  7.5* 8.0* 7.7* 7.9* 7.5*  MG 1.6  --  1.9  --  2.3  --   --   --   < > = values in this interval not displayed.  Liver Function Tests:  Recent Labs Lab 01/12/15 0614 01/13/15 0503 01/14/15 0518 01/15/15 1540 01/16/15 0509  AST _1 40* 42*  ALT _2 ALKPHOS 81 151* 211* 263* 233*  BILITOT 0.6 0.2* 0.3 0.3 0.9  PROT 5.0* 5.7* 5.6* 6.0 5.5*  ALBUMIN 1.4* 1.4* 1.3* 1.3* 1.2*   No results for input(s): LIPASE, AMYLASE in the last 168 hours. No results for input(s): AMMONIA in the last 168 hours.  CBC:  Recent Labs Lab 01/12/15 0614  01/13/15 0503 01/14/15 0518 01/15/15 1540 01/16/15 0509  WBC 13.4* 13.4* 10.8* 12.4* 12.2*  HGB 10.3* 10.6* 9.9* 9.8* 9.1*  HCT 30.8* 31.4* 29.1* 29.3* 27.0*  MCV 82.1 82.4 83.1 83.5 83.6  PLT 169 243 264 411* 372    Cardiac Enzymes:  Recent Labs Lab 01/10/15 2036 01/11/15 0235 01/12/15 0614 01/12/15 1130 01/12/15 1849  CKTOTAL  --   --  49  --   --   TROPONINI 0.33* 0.28* 0.84* 0.65* 0.47*    BNP: Invalid input(s): POCBNP  CBG:  Recent Labs Lab 01/15/15 0422 01/15/15 0747 01/15/15 1651 01/15/15 2135 01/16/15 0622  GLUCAP 179* 219* 198* 23* 27*    Microbiology: Results for orders placed or performed during the hospital encounter of 01/09/15  Urine culture     Status: None   Collection Time: 01/09/15  1:31 AM  Result Value Ref Range Status   Specimen Description URINE, CLEAN CATCH  Final   Special Requests NONE  Final  Colony Count   Final    3,000 COLONIES/ML Performed at [facility]    Culture   Final    INSIGNIFICANT GROWTH Performed at [facility]    Report Status 01/10/2015 FINAL  Final  Stool culture     Status: None   Collection Time: 01/09/15  4:09 AM  Result Value Ref Range Status   Specimen Description VAGINAL/RECTAL  Final   Special Requests Normal  Final   Culture   Final    NO SALMONELLA, SHIGELLA, CAMPYLOBACTER, YERSINIA, OR E.COLI 0157:H7 ISOLATED Performed at [facility]    Report Status 01/12/2015 FINAL  Final  Clostridium Difficile by PCR     Status: None   Collection Time: 01/09/15  4:09 AM  Result Value Ref Range Status   C difficile by pcr NEGATIVE NEGATIVE Final  Culture, blood (routine x 2)     Status: None   Collection Time: 01/10/15  4:35 AM  Result Value Ref Range Status   Specimen Description BLOOD RIGHT HAND  Final   Special Requests BOTTLES DRAWN AEROBIC AND ANAEROBIC 10CC EA  Final   Culture   Final    STAPHYLOCOCCUS AUREUS Note: SUSCEPTIBILITIES PERFORMED ON PREVIOUS CULTURE WITHIN  THE LAST 5 DAYS. Note: Culture results may be compromised due to an excessive volume of blood received in culture bottles. Gram Stain Report Called to,Read Back By and Verified With:  MONICA YIM ON 01/10/2015 AT 11:36P BY WILEJ Performed at [facility]    Report Status 01/13/2015 FINAL  Final  Culture, blood (routine x 2)     Status: None   Collection Time: 01/10/15  4:46 AM  Result Value Ref Range Status   Specimen Description BLOOD LEFT ARM  Final   Special Requests BOTTLES DRAWN AEROBIC AND ANAEROBIC 10CC EA  Final   Culture   Final    STAPHYLOCOCCUS AUREUS Note: SUSCEPTIBILITIES PERFORMED ON PREVIOUS CULTURE WITHIN THE LAST 5 DAYS. Note: Culture results may be compromised due to an excessive volume of blood received in culture bottles. Gram Stain Report Called to,Read Back By and Verified With: MONICA YIM ON 01/10/2015 AT 11:36P BY WILEJ Performed at [facility]    Report Status 01/13/2015 FINAL  Final  Respiratory virus panel (routine influenza)     Status: None   Collection Time: 01/10/15 10:34 AM  Result Value Ref Range Status   Source - RVPAN NASAL SWAB  Corrected    Comment: CORRECTED ON 01/12 AT 2036: PREVIOUSLY REPORTED AS NASAL SWAB   Respiratory Syncytial Virus A NOT DETECTED  Final   Respiratory Syncytial Virus B NOT DETECTED  Final   Influenza A NOT DETECTED  Final   Influenza B NOT DETECTED  Final   Parainfluenza 1 NOT DETECTED  Final   Parainfluenza 2 NOT DETECTED  Final   Parainfluenza 3 NOT DETECTED  Final   Metapneumovirus NOT DETECTED  Final   Rhinovirus NOT DETECTED  Final   Adenovirus NOT DETECTED  Final   Influenza A H1 NOT DETECTED  Final   Influenza A H3 NOT DETECTED  Final    Comment: (NOTE)       Normal Reference Range for each Analyte: NOT DETECTED Testing performed using the Luminex xTAG Respiratory Viral Panel test kit. The analytical performance characteristics of this assay have been determined by [facility].  The  modifications have not been cleared or approved by the FDA. This assay has been validated pursuant to the CLIA regulations and is  used for clinical purposes. Performed at [facility], blood (routine x 2)     Status: None   Collection Time: 01/10/15 11:19 AM  Result Value Ref Range Status   Specimen Description BLOOD LEFT FOREARM  Final   Special Requests BOTTLES DRAWN AEROBIC ONLY 5CC  Final   Culture   Final    STAPHYLOCOCCUS AUREUS Note: RIFAMPIN AND GENTAMICIN SHOULD NOT BE USED AS SINGLE DRUGS FOR TREATMENT OF STAPH INFECTIONS. Note: Gram Stain Report Called to,Read Back By and Verified With: NURSE MONICA YIM 01/11/15 6:05AM THOMI Performed at [facility]    Report Status 01/13/2015 FINAL  Final   Organism ID, Bacteria STAPHYLOCOCCUS AUREUS  Final      Susceptibility   Staphylococcus aureus - MIC*    CLINDAMYCIN <=0.25 SENSITIVE Sensitive     ERYTHROMYCIN <=0.25 SENSITIVE Sensitive     GENTAMICIN <=0.5 SENSITIVE Sensitive     LEVOFLOXACIN 4 INTERMEDIATE Intermediate     OXACILLIN 0.5 SENSITIVE Sensitive     PENICILLIN 0.12 SENSITIVE Sensitive     RIFAMPIN 2 INTERMEDIATE Intermediate     TRIMETH/SULFA <=10 SENSITIVE Sensitive     VANCOMYCIN 1 SENSITIVE Sensitive     TETRACYCLINE <=1 SENSITIVE Sensitive     MOXIFLOXACIN 2 RESISTANT Resistant     * STAPHYLOCOCCUS AUREUS  Culture, blood (routine x 2)     Status: None   Collection Time: 01/10/15 11:27 AM  Result Value Ref Range Status   Specimen Description BLOOD LEFT ANTECUBITAL  Final   Special Requests BOTTLES DRAWN AEROBIC ONLY [place]  Final   Culture   Final    STAPHYLOCOCCUS AUREUS Note: SUSCEPTIBILITIES PERFORMED ON PREVIOUS CULTURE WITHIN THE LAST 5 DAYS. Note: Gram Stain Report Called to,Read Back By and Verified With: NURSE MONICA YIM 01/11/15 6:05AM THOMI Performed at [facility]    Report Status 01/13/2015 FINAL  Final  Culture, blood (routine x 2)     Status: None (Preliminary  result)   Collection Time: 01/13/15  7:20 PM  Result Value Ref Range Status   Specimen Description BLOOD RIGHT ARM  Final   Special Requests BOTTLES DRAWN AEROBIC AND ANAEROBIC 5CC  Final   Culture   Final           BLOOD CULTURE RECEIVED NO GROWTH TO DATE CULTURE WILL BE HELD FOR 5 DAYS BEFORE ISSUING A FINAL NEGATIVE REPORT Performed at [facility]    Report Status PENDING  Incomplete  Culture, blood (routine x 2)     Status: None (Preliminary result)   Collection Time: 01/13/15  7:20 PM  Result Value Ref Range Status   Specimen Description BLOOD RIGHT ARM  Final   Special Requests BOTTLES DRAWN AEROBIC AND ANAEROBIC 3CC  Final   Culture   Final           BLOOD CULTURE RECEIVED NO GROWTH TO DATE CULTURE WILL BE HELD FOR 5 DAYS BEFORE ISSUING A FINAL NEGATIVE REPORT Performed at [facility]    Report Status PENDING  Incomplete  Body fluid culture     Status: None (Preliminary result)   Collection Time: 01/14/15  9:02 AM  Result Value Ref Range Status   Specimen Description FLUID RIGHT SHOULDER SYNOVIAL  Final   Special Requests NONE  Final   Gram Stain   Final    FEW WBC PRESENT,BOTH PMN AND MONONUCLEAR NO ORGANISMS SEEN Performed at [facility]   Final  NO GROWTH 2 DAYS Performed at [facility]    Report Status PENDING  Incomplete    Coagulation Studies:  Recent Labs  01/14/15 0518  LABPROT 15.6*  INR 1.22    Urinalysis:  Recent Labs  01/14/15 0508  COLORURINE YELLOW  LABSPEC 1.015  PHURINE 5.5  GLUCOSEU 250*  HGBUR MODERATE*  BILIRUBINUR NEGATIVE  KETONESUR NEGATIVE  PROTEINUR >300*  UROBILINOGEN 1.0  NITRITE NEGATIVE  LEUKOCYTESUR NEGATIVE      Imaging: Dg Orthopantogram  01/14/2015   CLINICAL DATA:  Staphylococcal endocarditis.  Poor dentition.  EXAM: ORTHOPANTOGRAM/PANORAMIC  COMPARISON:  None.  FINDINGS: Extensive dental disease with caries involving multiple teeth, including numbers 7, 8, 9,  10, 11, 12, 13, 14, 15, 16, 19, 20, 29, 30, 31 and 32. Periapical lucencies are present adjacent to teeth numbers 29 and 30.  IMPRESSION: Extensive dental disease as described.   Electronically Signed   By: Evangeline Dakin M.D.   On: 01/14/2015 21:31   Mr Brain Wo Contrast  01/14/2015   CLINICAL DATA:  35 year old male with history of staph aureus bacteremia and endocarditis.  EXAM: MRI HEAD WITHOUT CONTRAST  TECHNIQUE: Multiplanar, multiecho pulse sequences of the brain and surrounding structures were obtained without intravenous contrast.  COMPARISON:  Prior study from 04/25/2012  FINDINGS: Cerebral volume within normal limits for patient age. No significant chronic small vessel ischemic changes identified. No mass lesion, midline shift, or mass effect. Ventricles are normal in size without evidence of hydrocephalus. No extra-axial fluid collection.  There are multiple subcentimeter ischemic infarcts involving predominantly the supratentorial brain. These involve both frontal lobes, the right parietal lobe, left temporal lobe, ischemic infarct within the right is cerebral hemisphere is located in the anterior right frontal lobe and measures 5 mm (series 3, image 36). The largest infarct in the left cerebral hemisphere is located in the deep white matter of the anterior left frontal lobe and measures 13 mm (series 3, image 30). There are infarcts involving the bilateral basal ganglia, the most prominent of which located in the right thalamus which measures 4 mm. There is a single infratentorial infarct within the left cerebellar hemisphere measuring 4 mm. These are most likely embolic in nature given the history of endocarditis and multiple vascular distributions. Scattered susceptibility artifact seen associated with a few of these infarcts, likely reflecting small petechial hemorrhages. No frank hemorrhagic conversion There is associated T2/FLAIR signal intensity without significant mass effect.   Craniocervical junction within normal limits. Pituitary gland normal.  No acute abnormality seen about the orbits.  Paranasal sinuses are clear.  No mastoid effusion.  Bone marrow signal intensity within normal limits. Visualized upper cervical spine unremarkable.  Scalp soft tissues within normal limits.  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 These are likely embolic in nature given the history of endocarditis and involvement of multiple vascular distributions. No significant mass effect. There are likely scattered small petechial hemorrhages with a number these infarcts without frank hemorrhage.   Electronically Signed   By: Jeannine Boga M.D.   On: 01/14/2015 21:24   Mr Thoracic Spine Wo Contrast  01/15/2015   ADDENDUM REPORT: 01/15/2015 14:40  ADDENDUM: Study discussed by telephone with Dr. Rhina Brackett DAM on 01/15/2015 at 1435 hrs.   Electronically Signed   By: Lars Pinks M.D.   On: 01/15/2015 14:40   01/15/2015   CLINICAL DATA:  35 year old male with sepsis and back pain. Into card itis, purulent pericarditis, multiple small  embolic infarcts. Initial encounter.  EXAM: MRI THORACIC AND LUMBAR SPINE WITHOUT CONTRAST  TECHNIQUE: Multiplanar and multiecho pulse sequences of the thoracic and lumbar spine were obtained without intravenous contrast.  COMPARISON:  Chest radiographs 01/14/2015. CT Abdomen and Pelvis 05/18/2013.  FINDINGS: MR THORACIC SPINE FINDINGS  Limited sagittal imaging of the cervical spine is unremarkable.  Preserved thoracic vertebral height and alignment, preserved thoracic vertebral height and alignment.  Throughout much of the T3 and T4 vertebral bodies there is heterogeneous T2 signal as well as patchy pre STIR signal. The STIR images were repeated, an the same patchy increased signal persisted at T3 and T4.  No definite signal abnormality in the intervening disc, or adjacent disc spaces; there is widespread  intervertebral disc T2 hyperintensity, as a normal finding seen in young patients.  No definite paraspinal or epidural inflammation at those levels.  No suspicious marrow signal elsewhere; intrinsic T1 and T2 hyperintensity at T6 is a benign vertebral body hemangioma.  However, there are layering pleural effusions left greater than right. Negative visualized upper abdominal viscera.  Epidural lipomatosis throughout the thoracic spine and effaces CSF from the thecal sac from the T5 to the T9 level. No definite spinal cord signal abnormality.  MR LUMBAR SPINE FINDINGS  In the lumbar spine T1-T2 and stir marrow signal appears diminished. This is also true in the visible pelvis. Possible associated decreased T2 signal in both the liver and spleen.  Otherwise negative visualized abdominal viscera.  There is mildly increased STIR signal in the lower lumbar erector spinae muscles bilaterally, at L4-L5 (series 17, images 9 and 1). Subcutaneous edema incidentally noted.  Normal lumbar vertebral height and alignment and no marrow edema or acute osseous abnormality in the lumbar spine.  No definite signal abnormality in the lower thoracic spinal cord, conus at L1-L2.  Lumbar facet hypertrophy.  No lumbar spinal stenosis.  IMPRESSION: MR THORACIC SPINE IMPRESSION  1. Abnormal marrow edema in the T3 and T4 vertebral bodies highly suspicious for acute osteomyelitis in this setting. No associated epidural or paraspinal inflammation identified. 2. No other acute findings in the thoracic spine. Epidural lipomatosis. 3. Left greater than right layering pleural effusions.  MR LUMBAR SPINE IMPRESSION  1. Mild increased signal in the L4-L5 level erector spinae muscles bilaterally, which could be infectious myositis but also this appearance can be seen without infection in chronically debilitated patients. 2. No other acute finding in the lumbar spine. 3. Decreased bone marrow signal suggestive of red marrow conversion versus  hemosiderosis.  Electronically Signed: By: Lars Pinks M.D. On: 01/15/2015 14:20   Mr Lumbar Spine Wo Contrast  01/15/2015   ADDENDUM REPORT: 01/15/2015 14:40  ADDENDUM: Study discussed by telephone with Dr. Rhina Brackett DAM on 01/15/2015 at 1435 hrs.   Electronically Signed   By: Lars Pinks M.D.   On: 01/15/2015 14:40   01/15/2015   CLINICAL DATA:  35 year old male with sepsis and back pain. Into card itis, purulent pericarditis, multiple small embolic infarcts. Initial encounter.  EXAM: MRI THORACIC AND LUMBAR SPINE WITHOUT CONTRAST  TECHNIQUE: Multiplanar and multiecho pulse sequences of the thoracic and lumbar spine were obtained without intravenous contrast.  COMPARISON:  Chest radiographs 01/14/2015. CT Abdomen and Pelvis 05/18/2013.  FINDINGS: MR THORACIC SPINE FINDINGS  Limited sagittal imaging of the cervical spine is unremarkable.  Preserved thoracic vertebral height and alignment, preserved thoracic vertebral height and alignment.  Throughout much of the T3 and T4 vertebral bodies there is heterogeneous T2 signal as well as  patchy pre STIR signal. The STIR images were repeated, an the same patchy increased signal persisted at T3 and T4.  No definite signal abnormality in the intervening disc, or adjacent disc spaces; there is widespread intervertebral disc T2 hyperintensity, as a normal finding seen in young patients.  No definite paraspinal or epidural inflammation at those levels.  No suspicious marrow signal elsewhere; intrinsic T1 and T2 hyperintensity at T6 is a benign vertebral body hemangioma.  However, there are layering pleural effusions left greater than right. Negative visualized upper abdominal viscera.  Epidural lipomatosis throughout the thoracic spine and effaces CSF from the thecal sac from the T5 to the T9 level. No definite spinal cord signal abnormality.  MR LUMBAR SPINE FINDINGS  In the lumbar spine T1-T2 and stir marrow signal appears diminished. This is also true in the visible  pelvis. Possible associated decreased T2 signal in both the liver and spleen.  Otherwise negative visualized abdominal viscera.  There is mildly increased STIR signal in the lower lumbar erector spinae muscles bilaterally, at L4-L5 (series 17, images 9 and 1). Subcutaneous edema incidentally noted.  Normal lumbar vertebral height and alignment and no marrow edema or acute osseous abnormality in the lumbar spine.  No definite signal abnormality in the lower thoracic spinal cord, conus at L1-L2.  Lumbar facet hypertrophy.  No lumbar spinal stenosis.  IMPRESSION: MR THORACIC SPINE IMPRESSION  1. Abnormal marrow edema in the T3 and T4 vertebral bodies highly suspicious for acute osteomyelitis in this setting. No associated epidural or paraspinal inflammation identified. 2. No other acute findings in the thoracic spine. Epidural lipomatosis. 3. Left greater than right layering pleural effusions.  MR LUMBAR SPINE IMPRESSION  1. Mild increased signal in the L4-L5 level erector spinae muscles bilaterally, which could be infectious myositis but also this appearance can be seen without infection in chronically debilitated patients. 2. No other acute finding in the lumbar spine. 3. Decreased bone marrow signal suggestive of red marrow conversion versus hemosiderosis.  Electronically Signed: By: Lars Pinks M.D. On: 01/15/2015 14:20   Mr Tibia Fibula Right Wo Contrast  01/15/2015   CLINICAL DATA:  Diabetic patient with bacteremia and right leg pain.  EXAM: MRI OF LOWER RIGHT EXTREMITY WITHOUT CONTRAST  TECHNIQUE: Multiplanar, multisequence MR imaging of the right lower leg was performed. No intravenous contrast was administered.  COMPARISON:  None.  FINDINGS: The axial and coronal sequences incidentally include the left lower leg. There is marked subcutaneous edema of both lower legs which appears slightly worse on the left. No focal subcutaneous fluid collection is identified. Mildly increased T2 signal is seen in the  gastrocnemius, soleus and and tibialis posterior muscles bilaterally which appears symmetric. No intramuscular fluid collection is identified. No bone marrow signal abnormality to suggest osteomyelitis is seen. There is no muscle or tendon tear.  IMPRESSION: Intense bilateral lower leg subcutaneous edema appears worse on the left and could be due to dependent change or cellulitis.  Mildly increased T2 signal in the gastrocnemius, soleus and tibialis posterior muscles bilaterally could be due to myositis or early changes of denervation atrophy in this diabetic patient.  Negative for abscess or osteomyelitis.   Electronically Signed   By: Inge Rise M.D.   On: 01/15/2015 08:28     Medications:   . 0.9 % NaCl with KCl 40 mEq / L 100 mL/hr (01/16/15 1017)   . chlorhexidine  15 mL Mouth/Throat BID  . feeding supplement (GLUCERNA SHAKE)  237 mL Oral BID BM  .  feeding supplement (PRO-STAT SUGAR FREE 64)  30 mL Oral Q1500  . hydrALAZINE  25 mg Oral 3 times per day  . Influenza vac split quadrivalent PF  0.5 mL Intramuscular Tomorrow-1000  . insulin aspart  0-15 Units Subcutaneous QAC lunch  . insulin aspart  0-5 Units Subcutaneous QHS  . insulin aspart protamine- aspart  25 Units Subcutaneous BID WC  . living well with diabetes book   Does not apply Once  . metoprolol tartrate  50 mg Oral BID  . nafcillin IV  2 g Intravenous 6 times per day  . neomycin-bacitracin-polymyxin   Topical Daily  . nicotine  7 mg Transdermal Daily  . sodium chloride  500 mL Intravenous Once   acetaminophen **OR** acetaminophen, alum & mag hydroxide-simeth, hydrALAZINE, HYDROmorphone (DILAUDID) injection, ondansetron **OR** ondansetron (ZOFRAN) IV, oxyCODONE  Assessment/ Plan:  Jaidan Prevette is a 35 y.o. male with a history of Uncontrolled HTN, DM2 who presents to the ED with complaints of diarrhea and fevers and chills x 24 hours. He reports having weakness and fatigue and He denies any nausea or vomiting.  No one is sick at home, and he denies taking any recent antibiotic therapy. He reports that he has been out of his medications for months, and has not seen his PCP in months as well. 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 Baseline creatinine was normal although creatinine was elevated on admission. No contrast administration and no NSAIDS. Received vancomycin. Outpatient medications included lisinopril and metformin Staph bacteremia endocarditis evaluation and recommendations by ID, VVS and TCTS   Acute renal failure  in setting of staph aureus bacteremia. This may be ATN secondary to sepsis with underlying diabetic nephropathy and chronic renal disease normal U/S and creatinine improved  baseline creatinine 1.3  Anemia no ESA at present would not use Iron is setting in infection  Hypokalemia Replace with potassium  Hyponatremia secondary to ARF  Renal function appears to be improving although the patient is very unwell with concerning dissemination of Staph bacteremia.  I shall sign off as we have a limited role at present . Please limit any nephrotoxin exposure and call us back if needed  Dr Lorrene Reid is taking care of A service 1/18   LOS: 7 Tyaisha Cullom W _0 _1 :11 PM

## 2015-01-17 ENCOUNTER — Encounter (HOSPITAL_COMMUNITY): Payer: Self-pay | Admitting: Dentistry

## 2015-01-17 DIAGNOSIS — I509 Heart failure, unspecified: Secondary | ICD-10-CM | POA: Insufficient documentation

## 2015-01-17 DIAGNOSIS — K045 Chronic apical periodontitis: Secondary | ICD-10-CM

## 2015-01-17 DIAGNOSIS — K029 Dental caries, unspecified: Secondary | ICD-10-CM

## 2015-01-17 LAB — BODY FLUID CULTURE: Culture: NO GROWTH

## 2015-01-17 LAB — GLUCOSE, CAPILLARY
GLUCOSE-CAPILLARY: 211 mg/dL — AB (ref 70–99)
GLUCOSE-CAPILLARY: 125 mg/dL — AB (ref 70–99)
Glucose-Capillary: 252 mg/dL — ABNORMAL HIGH (ref 70–99)
Glucose-Capillary: 292 mg/dL — ABNORMAL HIGH (ref 70–99)
Glucose-Capillary: 127 mg/dL — ABNORMAL HIGH (ref 70–99)

## 2015-01-17 LAB — COMPREHENSIVE METABOLIC PANEL
ALBUMIN: 1.2 g/dL — AB (ref 3.5–5.2)
ALT: 17 U/L (ref 0–53)
ANION GAP: 4 — AB (ref 5–15)
AST: 29 U/L (ref 0–37)
Alkaline Phosphatase: 209 U/L — ABNORMAL HIGH (ref 39–117)
BILIRUBIN TOTAL: 1.2 mg/dL (ref 0.3–1.2)
BUN: 15 mg/dL (ref 6–23)
CHLORIDE: 105 meq/L (ref 96–112)
CO2: 23 mmol/L (ref 19–32)
CREATININE: 1.86 mg/dL — AB (ref 0.50–1.35)
Calcium: 7.6 mg/dL — ABNORMAL LOW (ref 8.4–10.5)
GFR calc Af Amer: 53 mL/min — ABNORMAL LOW (ref >90)
GFR, EST NON AFRICAN AMERICAN: 46 mL/min — AB (ref >90)
Glucose, Bld: 228 mg/dL — ABNORMAL HIGH (ref 70–99)
Potassium: 3.6 mmol/L (ref 3.5–5.1)
SODIUM: 132 mmol/L — AB (ref 135–145)
Total Protein: 6 g/dL (ref 6.0–8.3)

## 2015-01-17 LAB — CBC
HCT: 28.7 % — ABNORMAL LOW (ref 39.0–52.0)
HEMOGLOBIN: 9.5 g/dL — AB (ref 13.0–17.0)
MCH: 28.5 pg (ref 26.0–34.0)
MCHC: 33.1 g/dL (ref 30.0–36.0)
MCV: 86.2 fL (ref 78.0–100.0)
Platelets: 387 10*3/uL (ref 150–400)
RBC: 3.33 MIL/uL — ABNORMAL LOW (ref 4.22–5.81)
RDW: 14.3 % (ref 11.5–15.5)
WBC: 13.7 10*3/uL — AB (ref 4.0–10.5)

## 2015-01-17 LAB — BRAIN NATRIURETIC PEPTIDE: B Natriuretic Peptide: 315.1 pg/mL — ABNORMAL HIGH (ref 0.0–100.0)

## 2015-01-17 MED ORDER — INSULIN ASPART PROT & ASPART (70-30 MIX) 100 UNIT/ML ~~LOC~~ SUSP: 30.0000 [IU] | Freq: Two times a day (BID) | SUBCUTANEOUS | Status: DC

## 2015-01-17 MED ORDER — BISACODYL 10 MG RE SUPP
10.0000 mg | Freq: Once | RECTAL | Status: DC
  Filled 2015-01-17: qty 1

## 2015-01-17 MED ORDER — INSULIN ASPART PROT & ASPART (70-30 MIX) 100 UNIT/ML ~~LOC~~ SUSP
28.0000 [IU] | Freq: Two times a day (BID) | SUBCUTANEOUS | Status: DC
  Administered 2015-01-17 – 2015-01-22 (×11): 28 [IU] via SUBCUTANEOUS
  Administered 2015-01-23: 14 [IU] via SUBCUTANEOUS
  Filled 2015-01-17: qty 10

## 2015-01-17 MED ORDER — FUROSEMIDE 10 MG/ML IJ SOLN
40.0000 mg | Freq: Once | INTRAMUSCULAR | Status: AC
  Administered 2015-01-17: 40 mg via INTRAVENOUS
  Filled 2015-01-17: qty 4

## 2015-01-17 NOTE — Progress Notes (Signed)
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 Problems:   Tobacco abuse   Noncompliance   Dehydration   Diarrhea   Diabetes type 2, uncontrolled   Hyperglycemia   Uncontrolled hypertension   Diabetic foot ulcers   Pain   Diabetic ulcer of left foot associated with type 2 diabetes mellitus   Diabetic ulcer of right foot associated with type 2 diabetes mellitus   Osteomyelitis   Shoulder pain   Thigh pain, musculoskeletal   Septic shock   Staphylococcus aureus bacteremia with sepsis   Smoker   Elevated troponin   Acute renal failure   Bacteremia   Septic joint of right shoulder region   Infective myositis of left thigh   Purulent pericarditis   Mitral regurgitation   Endocarditis   Back pain   Poor dentition   Discitis of thoracic region    SUBJECTIVE  "Everything hurts". C/o arm, thigh and knee pain - swelling in legs, short of breath laying down. On NS at 100/hr. Sodium is trending down today. He is +9L positive since admission. Creatinine is trending down.  CURRENT MEDS . chlorhexidine  15 mL Mouth/Throat BID  . feeding supplement (GLUCERNA SHAKE)  237 mL Oral BID BM  . feeding supplement (PRO-STAT SUGAR FREE 64)  30 mL Oral Q1500  . hydrALAZINE  25 mg Oral 3 times per day  . Influenza vac split quadrivalent PF  0.5 mL Intramuscular Tomorrow-1000  . insulin aspart  0-15 Units Subcutaneous QAC lunch  . insulin aspart  0-5 Units Subcutaneous QHS  . insulin aspart protamine- aspart  28 Units Subcutaneous BID WC  . living well with diabetes book   Does not apply Once  . metoprolol tartrate  50 mg Oral BID  . nafcillin IV  2 g Intravenous 6 times per day  . neomycin-bacitracin-polymyxin   Topical Daily  . nicotine  7 mg Transdermal Daily  . sodium chloride  500 mL Intravenous Once    OBJECTIVE  Filed Vitals:   01/16/15 1018 01/16/15 1604 01/16/15 2008 01/17/15 0549  BP:  160/98 145/83 148/83 157/87  Pulse: 88 84 87 90  Temp:  99.1 F (37.3 C) 99.7 F (37.6 C) 98.5 F (36.9 C)  TempSrc:  Oral Oral Oral  Resp:  16 16 16   Height:      Weight:      SpO2:  100% 100% 95%    Intake/Output Summary (Last 24 hours) at 01/17/15 0840 Last data filed at 01/17/15 0600  Gross per 24 hour  Intake 4441.67 ml  Output      0 ml  Net 4441.67 ml   Filed Weights   01/09/15 0017 01/09/15 0329  Weight: 220 lb (99.791 kg) 226 lb 6.4 oz (102.694 kg)    PHYSICAL EXAM  General: Pleasant, NAD. Neuro: Alert and oriented X 3. Moves all extremities spontaneously. Psych: Normal affect. HEENT:  Normal  Neck: Supple without bruits or JVD. Lungs:  Resp regular and unlabored, anterior exam CTA. Heart: RRR no s3, s4, soft HSM apex. +RUB  Abdomen: Soft, non-tender, non-distended, BS + x 4.  Extremities: No clubbing, cyanosis or edema. LLE tender  Accessory Clinical Findings  CBC  Recent Labs  01/16/15 0509 01/17/15 0535  WBC 12.2* 13.7*  HGB 9.1* 9.5*  HCT 27.0* 28.7*  MCV 83.6 86.2  PLT 372 387   Basic Metabolic Panel  Recent Labs  01/16/15 0509 01/17/15 0535  NA 135 132*  K 3.6 3.6  CL 104 105  CO2 23 23  GLUCOSE 294* 228*  BUN 23 15  CREATININE 1.99* 1.86*  CALCIUM 7.5* 7.6*   Liver Function Tests  Recent Labs  01/16/15 0509 01/17/15 0535  AST 42* 29  ALT 19 17  ALKPHOS 233* 209*  BILITOT 0.9 1.2  PROT 5.5* 6.0  ALBUMIN 1.2* 1.2*   Cardiac Enzymes No results for input(s): CKTOTAL, CKMB, CKMBINDEX, TROPONINI in the last 72 hours. Hemoglobin A1C No results for input(s): HGBA1C in the last 72 hours.  TELE NSR with HR 80-90s    ECG  No new EKG  TEE 01/13/2015  LV EF: 60% -  65%  ------------------------------------------------------------------- Study Conclusions  - Left ventricle: The cavity size was normal. There was moderate concentric hypertrophy. Systolic function was normal. The estimated ejection fraction was in  the range of 60% to 65%. Wall motion was normal; there were no regional wall motion abnormalities. - Aortic valve: No evidence of vegetation. 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 There is an associated 4x6 mm vegetation. There is severe mitral insufficiency through the perforation in the posterior leaflet with reversal of flow in both the left and right pulmonary veins. - Left atrium: The atrium was mildly dilated. No evidence of thrombus in the atrial cavity or appendage. No spontaneous echo contrast was observed. - Right atrium: No evidence of thrombus in the atrial cavity or appendage. No evidence of thrombus in the atrial cavity or appendage. - Atrial septum: No defect or patent foramen ovale was identified. - Tricuspid valve: No evidence of vegetation. - Pulmonic valve: No evidence of vegetation. - Pericardium, extracardiac: A small pericardial effusion was identified circumferential to the heart. The fluid exhibited a fibrinous appearance.There was no evidence of hemodynamic compromise.     Radiology/Studies  Dg Orthopantogram  01/14/2015   CLINICAL DATA:  Staphylococcal endocarditis.  Poor dentition.  EXAM: ORTHOPANTOGRAM/PANORAMIC  COMPARISON:  None.  FINDINGS: Extensive dental disease with caries involving multiple teeth, including numbers 7, 8, 9, 10, 11, 12, 13, 14, 15, 16, 19, 20, 29, 30, 31 and 32. Periapical lucencies are present adjacent to teeth numbers 29 and 30.  IMPRESSION: Extensive dental disease as described.   Electronically Signed   By: Hulan Saas M.D.   On: 01/14/2015 21:31   Dg Chest 2 View  01/14/2015   CLINICAL DATA:  Right shoulder pain for 6 days.  EXAM: CHEST  2 VIEW  COMPARISON:  01/10/2015  FINDINGS: Two views of the chest demonstrate low lung volumes with bibasilar chest densities, left side greater than right. In addition, the  heart appears to be enlarged. The configuration of the heart raises the possibility for pericardial fluid. No evidence for pulmonary edema. Difficult to exclude small effusions, particularly on the left side.  IMPRESSION: Low lung volumes with bibasilar densities, left side greater than right. Basilar densities could represent a combination of atelectasis and pleural fluid.  Heart appears enlarged which may be accentuated by low lung volumes. Pericardial fluid cannot be excluded.   Electronically Signed   By: Richarda Overlie M.D.   On: 01/14/2015 08:17   Dg Shoulder Right  01/09/2015   CLINICAL DATA:  Anterior right shoulder pain  EXAM: RIGHT SHOULDER - 2+ VIEW  COMPARISON:  None.  FINDINGS: No fracture or dislocation is seen.  The joint spaces are preserved.  The visualized soft  tissues are unremarkable.  Visualized right lung is clear.  IMPRESSION: No fracture or dislocation is seen.   Electronically Signed   By: Charline Bills M.D.   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  EXAM: MRI HEAD WITHOUT CONTRAST  TECHNIQUE: Multiplanar, multiecho pulse sequences of the brain and surrounding structures were obtained without intravenous contrast.  COMPARISON:  Prior study from 04/25/2012  FINDINGS: Cerebral volume within normal limits for patient age. No significant chronic small vessel ischemic changes identified. No mass lesion, midline shift, or mass effect. Ventricles are normal in size without evidence of hydrocephalus. No extra-axial fluid collection.  There are multiple subcentimeter ischemic infarcts involving predominantly the supratentorial brain. These involve both frontal lobes, the right parietal lobe, left temporal lobe, ischemic infarct within the right is cerebral hemisphere is located in the anterior right frontal lobe and measures 5 mm (series 3, image 36). The largest infarct in the left  cerebral hemisphere is located in the deep white matter of the anterior left frontal lobe and measures 13 mm (series 3, image 30). There are infarcts involving the bilateral basal ganglia, the most prominent of which located in the right thalamus which measures 4 mm. There is a single infratentorial infarct within the left cerebellar hemisphere measuring 4 mm. These are most likely embolic in nature given the history of endocarditis and multiple vascular distributions. Scattered susceptibility artifact seen associated with a few of these infarcts, likely reflecting small petechial hemorrhages. No frank hemorrhagic conversion There is associated T2/FLAIR signal intensity without significant mass effect.  Craniocervical junction within normal limits. Pituitary gland normal.  No acute abnormality seen about the orbits.  Paranasal sinuses are clear.  No mastoid effusion.  Bone marrow signal intensity within normal limits. Visualized upper cervical spine unremarkable.  Scalp soft tissues within normal limits.  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 These are likely embolic in nature given the history of endocarditis and involvement of multiple vascular distributions. No significant mass effect. There are likely scattered small petechial hemorrhages with a number these infarcts without frank hemorrhage.   Electronically Signed   By: Rise Mu M.D.   On: 01/14/2015 21:24   Mr Thoracic Spine Wo Contrast  01/15/2015   ADDENDUM REPORT: 01/15/2015 14:40  ADDENDUM: Study discussed by telephone with Dr. Paulette Blanch DAM on 01/15/2015 at 1435 hrs.   Electronically Signed   By: Augusto [name] M.D.   On: 01/15/2015 14:40   01/15/2015   CLINICAL DATA:  35 year old male with sepsis and back pain. Into card itis, purulent pericarditis, multiple small embolic infarcts. Initial encounter.  EXAM: MRI THORACIC AND LUMBAR SPINE WITHOUT CONTRAST  TECHNIQUE:  Multiplanar and multiecho pulse sequences of the thoracic and lumbar spine were obtained without intravenous contrast.  COMPARISON:  Chest radiographs 01/14/2015. CT Abdomen and Pelvis 05/18/2013.  FINDINGS: MR THORACIC SPINE FINDINGS  Limited sagittal imaging of the cervical spine is unremarkable.  Preserved thoracic vertebral height and alignment, preserved thoracic vertebral height and alignment.  Throughout much of the T3 and T4 vertebral bodies there is heterogeneous T2 signal as well as patchy pre STIR signal. The STIR images were repeated, an the same patchy increased signal persisted at T3 and T4.  No definite signal abnormality in the intervening disc, or adjacent disc spaces; there is widespread intervertebral disc T2 hyperintensity, as a normal finding seen  in young patients.  No definite paraspinal or epidural inflammation at those levels.  No suspicious marrow signal elsewhere; intrinsic T1 and T2 hyperintensity at T6 is a benign vertebral body hemangioma.  However, there are layering pleural effusions left greater than right. Negative visualized upper abdominal viscera.  Epidural lipomatosis throughout the thoracic spine and effaces CSF from the thecal sac from the T5 to the T9 level. No definite spinal cord signal abnormality.  MR LUMBAR SPINE FINDINGS  In the lumbar spine T1-T2 and stir marrow signal appears diminished. This is also true in the visible pelvis. Possible associated decreased T2 signal in both the liver and spleen.  Otherwise negative visualized abdominal viscera.  There is mildly increased STIR signal in the lower lumbar erector spinae muscles bilaterally, at L4-L5 (series 17, images 9 and 1). Subcutaneous edema incidentally noted.  Normal lumbar vertebral height and alignment and no marrow edema or acute osseous abnormality in the lumbar spine.  No definite signal abnormality in the lower thoracic spinal cord, conus at L1-L2.  Lumbar facet hypertrophy.  No lumbar spinal stenosis.   IMPRESSION: MR THORACIC SPINE IMPRESSION  1. Abnormal marrow edema in the T3 and T4 vertebral bodies highly suspicious for acute osteomyelitis in this setting. No associated epidural or paraspinal inflammation identified. 2. No other acute findings in the thoracic spine. Epidural lipomatosis. 3. Left greater than right layering pleural effusions.  MR LUMBAR SPINE IMPRESSION  1. Mild increased signal in the L4-L5 level erector spinae muscles bilaterally, which could be infectious myositis but also this appearance can be seen without infection in chronically debilitated patients. 2. No other acute finding in the lumbar spine. 3. Decreased bone marrow signal suggestive of red marrow conversion versus hemosiderosis.  Electronically Signed: By: Augusto [name] M.D. On: 01/15/2015 14:20   Mr Lumbar Spine Wo Contrast  01/15/2015   ADDENDUM REPORT: 01/15/2015 14:40  ADDENDUM: Study discussed by telephone with Dr. Paulette Blanch DAM on 01/15/2015 at 1435 hrs.   Electronically Signed   By: Augusto [name] M.D.   On: 01/15/2015 14:40   01/15/2015   CLINICAL DATA:  35 year old male with sepsis and back pain. Into card itis, purulent pericarditis, multiple small embolic infarcts. Initial encounter.  EXAM: MRI THORACIC AND LUMBAR SPINE WITHOUT CONTRAST  TECHNIQUE: Multiplanar and multiecho pulse sequences of the thoracic and lumbar spine were obtained without intravenous contrast.  COMPARISON:  Chest radiographs 01/14/2015. CT Abdomen and Pelvis 05/18/2013.  FINDINGS: MR THORACIC SPINE FINDINGS  Limited sagittal imaging of the cervical spine is unremarkable.  Preserved thoracic vertebral height and alignment, preserved thoracic vertebral height and alignment.  Throughout much of the T3 and T4 vertebral bodies there is heterogeneous T2 signal as well as patchy pre STIR signal. The STIR images were repeated, an the same patchy increased signal persisted at T3 and T4.  No definite signal abnormality in the intervening disc, or adjacent disc  spaces; there is widespread intervertebral disc T2 hyperintensity, as a normal finding seen in young patients.  No definite paraspinal or epidural inflammation at those levels.  No suspicious marrow signal elsewhere; intrinsic T1 and T2 hyperintensity at T6 is a benign vertebral body hemangioma.  However, there are layering pleural effusions left greater than right. Negative visualized upper abdominal viscera.  Epidural lipomatosis throughout the thoracic spine and effaces CSF from the thecal sac from the T5 to the T9 level. No definite spinal cord signal abnormality.  MR LUMBAR SPINE FINDINGS  In the lumbar spine T1-T2 and stir marrow  signal appears diminished. This is also true in the visible pelvis. Possible associated decreased T2 signal in both the liver and spleen.  Otherwise negative visualized abdominal viscera.  There is mildly increased STIR signal in the lower lumbar erector spinae muscles bilaterally, at L4-L5 (series 17, images 9 and 1). Subcutaneous edema incidentally noted.  Normal lumbar vertebral height and alignment and no marrow edema or acute osseous abnormality in the lumbar spine.  No definite signal abnormality in the lower thoracic spinal cord, conus at L1-L2.  Lumbar facet hypertrophy.  No lumbar spinal stenosis.  IMPRESSION: MR THORACIC SPINE IMPRESSION  1. Abnormal marrow edema in the T3 and T4 vertebral bodies highly suspicious for acute osteomyelitis in this setting. No associated epidural or paraspinal inflammation identified. 2. No other acute findings in the thoracic spine. Epidural lipomatosis. 3. Left greater than right layering pleural effusions.  MR LUMBAR SPINE IMPRESSION  1. Mild increased signal in the L4-L5 level erector spinae muscles bilaterally, which could be infectious myositis but also this appearance can be seen without infection in chronically debilitated patients. 2. No other acute finding in the lumbar spine. 3. Decreased bone marrow signal suggestive of red marrow  conversion versus hemosiderosis.  Electronically Signed: By: Augusto [name] M.D. On: 01/15/2015 14:20   US Renal  01/12/2015   CLINICAL DATA:  Acute renal failure  EXAM: RENAL/URINARY TRACT ULTRASOUND COMPLETE  COMPARISON:  None.  FINDINGS: Right Kidney:  Length: 12.8 cm in length. Diffuse increased cortical echogenicity suspicious for medical renal disease. No hydronephrosis. No renal calculus.  Left Kidney:  Length: . 12.6 cm in length. Diffuse increased echogenicity suspicious for medical renal disease. No hydronephrosis. No diagnostic renal calculus.  Bladder:  Appears normal for degree of bladder distention.  IMPRESSION: Bilateral kidneys shows diffuse increased echogenicity. Medical renal disease cannot be excluded. No hydronephrosis or diagnostic renal calculus.   Electronically Signed   By: Natasha Mead M.D.   On: 01/12/2015 16:08   Mr Shoulder Right Wo Contrast  01/11/2015   CLINICAL DATA:  Right shoulder pain.  EXAM: MRI OF THE RIGHT SHOULDER WITHOUT CONTRAST  TECHNIQUE: Multiplanar, multisequence MR imaging of the shoulder was performed. No intravenous contrast was administered.  COMPARISON:  Radiographs dated 01/09/2015  FINDINGS: Rotator cuff:  Normal.  Muscles:  Normal.  Biceps long head: Properly located and intact. There is inflammation in the soft tissues around the bicipital tendon sheath with fluid in the bicipital tendon sheath.  Acromioclavicular Joint: Small degenerative cyst in the acromion. Otherwise normal AC joint. Type 1 acromion. No bursitis.  Glenohumeral Joint: There is a moderate glenohumeral joint effusion with extravasation of contrast from the subcoracoid recess of the joint into the adjacent soft tissues. Fluid distends the bicipital tendon sheath.  Labrum:  Intact.  Bones: No significant abnormalities. Tiny degenerative cystic areas in the posterior aspect of the greater tuberosity of the proximal humerus.  IMPRESSION: 1. Moderate glenohumeral joint effusion with some extravasation  of joint. Fluid distends the bicipital tendon sheath with inflammation/extravasated joint fluid around the bicipital tendon sheath. The possibly of synovitis should be considered. 2. Normal rotator cuff.   Electronically Signed   By: Geanie Cooley M.D.   On: 01/11/2015 11:21   Mr Tibia Fibula Right Wo Contrast  01/15/2015   CLINICAL DATA:  Diabetic patient with bacteremia and right leg pain.  EXAM: MRI OF LOWER RIGHT EXTREMITY WITHOUT CONTRAST  TECHNIQUE: Multiplanar, multisequence MR imaging of the right lower leg was performed. No intravenous contrast was  administered.  COMPARISON:  None.  FINDINGS: The axial and coronal sequences incidentally include the left lower leg. There is marked subcutaneous edema of both lower legs which appears slightly worse on the left. No focal subcutaneous fluid collection is identified. Mildly increased T2 signal is seen in the gastrocnemius, soleus and and tibialis posterior muscles bilaterally which appears symmetric. No intramuscular fluid collection is identified. No bone marrow signal abnormality to suggest osteomyelitis is seen. There is no muscle or tendon tear.  IMPRESSION: Intense bilateral lower leg subcutaneous edema appears worse on the left and could be due to dependent change or cellulitis.  Mildly increased T2 signal in the gastrocnemius, soleus and tibialis posterior muscles bilaterally could be due to myositis or early changes of denervation atrophy in this diabetic patient.  Negative for abscess or osteomyelitis.   Electronically Signed   By: Drusilla Kanner M.D.   On: 01/15/2015 08:28   Mr Foot Right Wo Contrast  01/11/2015   CLINICAL DATA:  Diabetic foot ulcers.  EXAM: MRI OF THE RIGHT FOREFOOT WITHOUT CONTRAST  TECHNIQUE: Multiplanar, multisequence MR imaging was performed. No intravenous contrast was administered.  COMPARISON:  Radiographs dated 01/09/2011  FINDINGS: There are small erosions of the proximal phalanx at the IP joint of the great toe and  there are small erosions at the first metatarsal phalangeal joint, both consistent with gout. There are no soft tissue abscesses. There is no osteomyelitis. There is nonspecific edema in the subcutaneous fat of the dorsum of the foot. Muscles and tendons and ligaments appear appear normal. No joint effusions.  IMPRESSION: Subcutaneous edema primarily on the dorsum of the foot. 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   Electronically Signed   By: Geanie Cooley M.D.   On: 01/11/2015 12:50   Mr Femur Left Wo Contrast  01/12/2015   CLINICAL DATA:  Diabetes. Peripheral neuropathy. Sepsis. Bilateral thigh pain. RIGHT thigh pain. Tenderness in the medial aspect of the thigh.  EXAM: MR OF THE LEFT FEMUR WITHOUT CONTRAST  TECHNIQUE: Multiplanar, multisequence MR imaging was performed. No intravenous contrast was administered.  COMPARISON:  None.  FINDINGS: Diffuse subcutaneous edema is present around the pelvis and in both thighs, most compatible with anasarca. This could also represent cellulitis in the appropriate clinical setting. Subcutaneous fluid is present in the lateral LEFT thigh, without a discrete abscess. There is myositis of the LEFT vastus lateralis with mild muscular edema av but no significant swelling. No fatty atrophy.  Bone marrow signal shows heterogenous marrow. This is a nonspecific finding most commonly associated with obesity, anemia, cigarette smoking or chronic disease. Negative for hip fracture or AVN. There is no evidence of osteomyelitis. No deep soft tissue abscesses are present.  Bilaterally in the groin, there are prominent lymph nodes which are probably reactive to lower extremity process.  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  IMPRESSION: 1. Diffuse subcutaneous edema in the pelvis and thighs bilaterally, suggesting anasarca. 2. More prominent subcutaneous edema along the LEFT thigh with subcutaneous fluid  superficial to the muscular fascia, likely representing cellulitis. No abscess. 3. Myositis of the proximal vastus lateralis, probably infectious. Reactive edema could produce this appearance as well. 4. Reactive inguinal adenopathy bilaterally. 5. Negative for osteomyelitis.   Electronically Signed   By: Andreas Newport M.D.   On: 01/12/2015 14:33   Mr Foot Left Wo Contrast  01/11/2015   CLINICAL DATA:  Diabetic foot ulcers.  Foot pain.  Bony erosions.  EXAM: MRI OF THE LEFT FOREFOOT WITHOUT CONTRAST  TECHNIQUE: Multiplanar, multisequence MR imaging was performed. No intravenous contrast was administered.  COMPARISON:  Radiograph dated 01/09/2015  FINDINGS: There is slight edema throughout the medial cuneiform. There is artifactual increased signal from the metatarsal heads in from the proximal phalanx of the great toe. There are erosions at IP joint and first metatarsal phalangeal joint consistent with gout.  There are no soft tissue abscesses and there is no osteomyelitis. There is prominent edema in the subcutaneous fat of the dorsum of the foot.  IMPRESSION: No evidence of osteomyelitis or abscess. The edema in the medial cuneiform is most likely a stress reaction. 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   Electronically Signed   By: Geanie Cooley M.D.   On: 01/11/2015 12:15   Dg Chest Port 1 View  01/10/2015   CLINICAL DATA:  Fever and sweats.  EXAM: PORTABLE CHEST - 1 VIEW  COMPARISON:  None.  FINDINGS: Low lung volumes with vascular crowding and streaky subsegmental atelectasis. No obvious infiltrate or effusion. The heart is within normal limits in size given the AP portable technique. The bony structures are grossly normal.  IMPRESSION: Low lung volumes with vascular crowding and bibasilar atelectasis.   Electronically Signed   By: Loralie Champagne M.D.   On: 01/10/2015 11:27   Dg Foot Complete Left  01/09/2015   CLINICAL DATA:  35 year old male with recurrent  bilateral foot pain.  EXAM: LEFT FOOT - COMPLETE 3+ VIEW  COMPARISON:  01/05/2014.  FINDINGS: Large periarticular erosions are noted adjacent to the first MTP joint, and at the first interphalangeal joint along the medial margin in both locations. These have erosions have sclerotic margins and overhanging edges, and there is adjacent soft tissue prominence, suggestive of gouty erosions. No adjacent soft tissue calcification is noted. No acute displaced fracture, subluxation or dislocation. Small heterotopic ossification adjacent to the plantar aspect of the calcaneus, likely ossification within the plantar fascia.  IMPRESSION: 1. Findings, as above, suggestive of underlying gout. Clinical correlation is recommended.   Electronically Signed   By: Trudie Reed M.D.   On: 01/09/2015 09:46   Dg Foot Complete Right  01/09/2015   CLINICAL DATA:  Recurring bilateral foot pain  EXAM: RIGHT FOOT COMPLETE - 3+ VIEW  COMPARISON:  None.  FINDINGS: No fracture or dislocation is seen.  Marginal erosion at the medial aspect of the 1st IP joint, involving the proximal phalanx.  The visualized soft tissues are unremarkable.  IMPRESSION: Marginal erosion at the medial aspect of the 1st IP joint. Correlate for inflammatory arthropathy such as gout.  Otherwise, no acute osseus abnormality is seen.   Electronically Signed   By: Charline Bills M.D.   On: 01/09/2015 09:44   Dg Fluoro Guide Ndl Plc/bx  01/14/2015   CLINICAL DATA:  Right shoulder pain and effusion.  Bacteremia.  EXAM: RIGHT SHOULDER ASPIRATION UNDER FLUOROSCOPY  FLUOROSCOPY TIME:  0 min 24 seconds  PROCEDURE: Overlying skin prepped with Betadine, draped in the usual sterile fashion, and infiltrated locally with buffered Lidocaine. Twenty gauge spinal needle was placed in the right glenohumeral joint under direct fluoroscopic visualization.  2 cc of bloody cloudy fluid was aspirated from the joint. Mr. Mota tolerated the procedure well.  IMPRESSION:  Technically successful right shoulder aspiration under fluoroscopy. Fluid sent for laboratory analysis.   Electronically Signed   By: Geanie Cooley M.D.   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     1. Elevated trop: in the setting of endocarditis, myocardial inflammation.  2. 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  - seen by Dr. Barry Dieneswens with CT surgery, discussed with Dr. Barry Dieneswens,  would prefer the bacteremia to clear up first, however we are concerned about the degree his MR worsened in 2 days between TTE and TEE. May need earlier surgery his start to have HF symptom. Also I am concerned about septic emboli.   - Pericarditis, small effusion. Discussed.  - per Dr. Cornelius Moraswen, ideally need cardiac cath, LE angiography to r/o septic emboli to LLE arterial system, however understandably his renal function is prohibitive of such test now. He will need dental eval prior to surgery. Dr. Cornelius Moraswen concerned recent AKI may be related to septic emboli as well, nephrology on board.    - if shoulder surgery is necessary, moderate cardiac risk (based on severe MR and underlying endocarditis) for shoulder debridement, and may proceed if benefit felt to outweigh risk . Careful with fluid administration. No current signs of CHF. EF normal. At this time, ortho team recommends med mgt. S/p aspiration. - still not ready for cath from a medical standpoint. Today he is orthopneic - he is +9L since admission. Recommend d/c IV fluids and dose IV lasix 40 mg x 1 today - given significant MR, he is at high risk for pulmonary edema. Check BNP.  3. staph bacteremia, MSSA- ID, Nafcillin. Now MRI of spine  concerning for osteo.  4. PVD, LE ABI 0.64. Vascular evaluated. No DVT, Monitor.  5. Acute on chronic renal insufficiency: improving on IV fluids, now +9L positive, would d/c IV fluids - he is orthopneic today. 6. Hypoalbuminemia 7. Hyponatremia - Suspect this is related to CHF at this point, given his +++ volume status. D/c IV fluids, give lasix today. Follow creatinine. Check BNP.  Chrystie NoseKenneth C. Hilty, MD, [facility] Mary[facility]S Community HospitalFACC Attending Cardiologist [facility]  Chrystie NoseHILTY,Kenneth C, MD

## 2015-01-17 NOTE — Consult Note (Signed)
DENTAL CONSULTATION  Date of Consultation:  01/17/2015 Patient Name:   Dwayne [name] Date of Birth:   Feb 20, 1980 Medical Record Number: [id number]  VITALS: BP 159/96 mmHg  Pulse 93  Temp(Src) 99.7 F (37.6 C) (Oral)  Resp 18  Ht  (1.88 m)  Wt 242 lb 9.6 oz (110.043 kg)  BMI 31.13 kg/m2  SpO2 97%  CHIEF COMPLAINT: Patient is referred by Dr. Cornelius Moras for a dental consultation.  HPI: Mann Skaggs is a 35 year old male recently diagnosed with staph aureus bacterial endocarditis. Patient with current IV antibiotic therapy. Patient subsequent found to have severe mitral regurgitation with vegetation. Patient with anticipated mitral valve replacement. Patient is now seen as part of a medically necessary pre-heart valve surgery dental protocol examination.  The patient currently denies having any acute toothaches. Patient has not seen a dentist for " a long time".  Patient has no regular primary dentist. Patient denies having any partial dentures.  PROBLEM LIST: Patient Active Problem List   Diagnosis Date Noted  . Congestive heart failure   . Discitis of thoracic region   . Back pain   . Poor dentition   . Endocarditis   . Mitral regurgitation 01/13/2015  . 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 Purulent pericarditis   . Acute renal failure   . Bacteremia   . Septic joint of right shoulder region   . Infective myositis of left thigh   . Diabetic ulcer of left foot associated with type 2 diabetes mellitus   . Diabetic ulcer of right foot associated with type 2 diabetes mellitus   . Osteomyelitis   . Shoulder pain   . Thigh pain, musculoskeletal   . Septic shock   . Staphylococcus aureus bacteremia with sepsis   . Smoker   . Elevated troponin   . Dehydration 01/09/2015  . Diarrhea 01/09/2015  . Diabetes type 2, uncontrolled 01/09/2015  . Hyperglycemia 01/09/2015  . Uncontrolled hypertension 01/09/2015  . Diabetic foot ulcers 01/09/2015  . Pain   . Essential  hypertension, benign 01/18/2014  . Noncompliance 01/18/2014  . Diabetic foot infection 01/15/2014  . Tobacco abuse 01/15/2014  . Diabetes mellitus due to underlying condition with foot ulcer 01/15/2014    PMH: Past Medical History  Diagnosis Date  . Diabetes mellitus   . Hypertension   . Acute renal failure   . Bacteremia     MSSA   . Diabetes type 2, uncontrolled 01/09/2015  . Diabetic foot infection 01/15/2014  . 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 Essential hypertension, benign 01/18/2014  . Infective myositis of left thigh   . Noncompliance 01/18/2014  . Septic shock   . Smoker   . Staphylococcus aureus bacteremia with sepsis   . Tobacco abuse 01/15/2014  . Mitral regurgitation 01/13/2015    Severe by TEE    PSH: Past Surgical History  Procedure Laterality Date  . I&d extremity Bilateral 01/16/2014    Procedure:  DEBRIDEMENT of bilateral foot ulcers;  Surgeon: Kathryne Hitch, MD;  Location: [facility] OR;  Service: Orthopedics;  Laterality: Bilateral;  . Tee without cardioversion N/A 01/13/2015    Procedure: TRANSESOPHAGEAL ECHOCARDIOGRAM (TEE);  Surgeon: Thurmon Fair, MD;  Location[facility] ENDOSCOPY;  Service: Cardiovascular;  Laterality: N/A;    ALLERGIES: No Known Allergies  MEDICATIONS: Current Facility-Administered Medications  Medication Dose Route Frequency Provider Last Rate Last Dose  . acetaminophen (TYLENOL) tablet 650 mg  650 mg Oral Q6H PRN Ron Parker, MD   650 mg at 01/17/15  1353   Or  . acetaminophen (TYLENOL) suppository 650 mg  650 mg Rectal Q6H PRN Ron ParkerHarvette C Jenkins, MD      . alum & mag hydroxide-simeth (MAALOX/MYLANTA) 200-200-20 MG/5ML suspension 30 mL  30 mL Oral Q6H PRN Ron ParkerHarvette C Jenkins, MD      . bisacodyl (DULCOLAX) suppository 10 mg  10 mg Rectal Once Calvert CantorSaima Rizwan, MD      . chlorhexidine (PERIDEX) 0.12 % solution 15 mL  15 mL Mouth/Throat BID Purcell Nailslarence H Owen, MD   15 mL at 01/16/15 2225  . feeding supplement (GLUCERNA SHAKE)  (GLUCERNA SHAKE) liquid 237 mL  237 mL Oral BID BM Stephanie La, RD   237 mL at 01/13/15 1513  . feeding supplement (PRO-STAT SUGAR FREE 64) liquid 30 mL  30 mL Oral Q1500 Marijean NiemannStephanie La, RD   30 mL at 01/12/15 1829  . hydrALAZINE (APRESOLINE) injection 10 mg  10 mg Intravenous Q6H PRN Ron ParkerHarvette C Jenkins, MD   10 mg at 01/10/15 0654  . hydrALAZINE (APRESOLINE) tablet 25 mg  25 mg Oral 3 times per day Calvert CantorSaima Rizwan, MD   25 mg at 01/17/15 [id number]  . HYDROmorphone (DILAUDID) injection 1 mg  1 mg Intravenous Q3H PRN Ripudeep Jenna LuoK Rai, MD   1 mg at 01/17/15 0945  . Influenza vac split quadrivalent PF (FLUARIX) injection 0.5 mL  0.5 mL Intramuscular Tomorrow-1000 Ripudeep K Rai, MD   0.5 mL at 01/10/15 1000  . insulin aspart (novoLOG) injection 0-15 Units  0-15 Units Subcutaneous QAC lunch Calvert CantorSaima Rizwan, MD   5 Units at 01/17/15 1208  . insulin aspart (novoLOG) injection 0-5 Units  0-5 Units Subcutaneous QHS Ripudeep Jenna LuoK Rai, MD   2 Units at 01/16/15 2225  . insulin aspart protamine- aspart (NOVOLOG MIX 70/30) injection 28 Units  28 Units Subcutaneous BID WC Calvert CantorSaima Rizwan, MD   28 Units at 01/17/15 0820  . living well with diabetes book MISC   Does not apply Once Richarda OverlieNayana Abrol, MD      . metoprolol (LOPRESSOR) tablet 50 mg  50 mg Oral BID Calvert CantorSaima Rizwan, MD   50 mg at 01/17/15 0941  . nafcillin 2 g in dextrose 5 % 50 mL IVPB  2 g Intravenous 6 times per day Richarda OverlieNayana Abrol, MD   2 g at 01/17/15 1211  . neomycin-bacitracin-polymyxin (NEOSPORIN) ointment   Topical Daily Richarda OverlieNayana Abrol, MD      . nicotine (NICODERM CQ - dosed in mg/24 hr) patch 7 mg  7 mg Transdermal Daily Ron ParkerHarvette C Jenkins, MD   7 mg at 01/09/15 0855  . ondansetron (ZOFRAN) tablet 4 mg  4 mg Oral Q6H PRN Ron ParkerHarvette C Jenkins, MD   4 mg at 01/10/15 0012   Or  . ondansetron (ZOFRAN) injection 4 mg  4 mg Intravenous Q6H PRN Ron ParkerHarvette C Jenkins, MD   4 mg at 01/11/15 0721  . oxyCODONE (Oxy IR/ROXICODONE) immediate release tablet 5 mg  5 mg Oral Q4H PRN Ripudeep Jenna LuoK  Rai, MD   5 mg at 01/17/15 1354  . sodium chloride 0.9 % bolus 500 mL  500 mL Intravenous Once Richarda OverlieNayana Abrol, MD        LABS: Lab Results  Component Value Date   WBC 13.7* 01/17/2015   HGB 9.5* 01/17/2015   HCT 28.7* 01/17/2015   MCV 86.2 01/17/2015   PLT 387 01/17/2015      Component Value Date/Time   NA 132* 01/17/2015 0535   K 3.6 01/17/2015 0535  CL 105 01/17/2015 0535   CO2 23 01/17/2015 0535   GLUCOSE 228* 01/17/2015 0535   BUN 15 01/17/2015 0535   CREATININE 1.86* 01/17/2015 0535   CREATININE 1.24 03/16/2014 1239   CALCIUM 7.6* 01/17/2015 0535   GFRNONAA 46* 01/17/2015 0535   GFRAA 53* 01/17/2015 0535   Lab Results  Component Value Date   INR 1.22 01/14/2015   No results found for: PTT  SOCIAL HISTORY: History   Social History  . Marital Status: Married    Spouse Name: N/A    Number of Children: N/A  . Years of Education: N/A   Occupational History  . Not on file.   Social History Main Topics  . Smoking status: Current Every Day Smoker    Types: Cigars  . Smokeless tobacco: Never Used  . Alcohol Use: Yes  . Drug Use: No  . Sexual Activity: Not on file   Other Topics Concern  . Not on file   Social History Narrative    FAMILY HISTORY: History reviewed. No pertinent family history.  REVIEW OF SYSTEMS: Reviewed from chart for this admission.  DENTAL HISTORY: HIEF COMPLAINT: Patient is referred by Dr. Cornelius Moras for a dental consultation.  HPI: Endrit Gittins is a 35 year old male recently diagnosed with staph aureus bacterial endocarditis. Patient with current IV antibiotic therapy. Patient subsequent found to have severe mitral regurgitation with vegetation. Patient with anticipated mitral valve replacement. Patient is now seen as part of a medically necessary pre-heart valve surgery dental protocol examination.  The patient currently denies having any acute toothaches. Patient has not seen a dentist for " a long time".  Patient has no  regular primary dentist. Patient denies having any partial dentures.   DENTAL EXAMINATION: GENERAL: Patient is a well-developed, well-nourished male in no acute distress. HEAD AND NECK: There is No obvious submandibular lymphadenopathy. The patient denies acute TMJ symptoms. INTRAORAL EXAM: Patient has normal saliva. There is no evidence of oral abscess formation. Patient has bilateral mandibular lingual tori. DENTITION: The patient has multiple missing teeth and multiple retained root segments. PERIODONTAL: Patient has chronic periodontal disease with plaque and calculus accumulations, gingival recession, and moderate to severe bone loss. Patient has tooth mobility. DENTAL CARIES/SUBOPTIMAL RESTORATIONS: There are multiple dental caries noted. ENDODONTIC: Patient with a history of acute pulpitis symptoms but none at this time. Patient has multiple areas of periapical pathology and radiolucency. CROWN AND BRIDGE: No crown or bridge restorations. PROSTHODONTIC: Patient denies having any partial dentures. OCCLUSION: Patient has a poor occlusal scheme secondary to multiple missing teeth, multiple retained root segments, and lack of replacement of missing teeth with dental prostheses  RADIOGRAPHIC INTERPRETATION: Orthopantogram was taken by the department of radiology. There are multiple missing teeth. There are multiple retained root segments. There are rampant dental caries noted. There is supra-eruption and drifting of the unopposed teeth into the edentulous areas. There is moderate to severe bone loss. There are multiple areas of periapical radiolucency and pathology.    ASSESSMENTS: 1. Staph aureus bacteria endocarditis 2. Severe mitral regurgitation with vegetation  3. Chronic apical periodontitis  4. Multiple retained root segments 5. Rampant dental caries 6. Multiple missing teeth 7. Supra-eruption and drifting of the unopposed teeth into the edentulous areas 8. Chronic periodontitis  with bone loss 9. Gingival recession 10. Plaque and calculus accumulations 11. Tooth mobility 12. Malocclusion 13. History of oral neglect 14. Risk for bleeding with invasive dental procedures 15. Risk for cardiovascular complications up to and including death with anticipated invasive  dental procedures.   PLAN/RECOMMENDATIONS: 1. I discussed the risks, benefits, and complications of various treatment options with the patient in relationship to his medical and dental conditions. We discussed various treatment options to include no treatment, multiple extractions with alveoloplasty, pre-prosthetic surgery as indicated, periodontal therapy, dental restorations, root canal therapy, crown and bridge therapy, implant therapy, and replacement of missing teeth as indicated. The patient currently is considering extraction of remaining upper teeth as well as tooth numbers 18, 19, 28, 31, and 32 with alveoloplasty and gross debridement of his remaining dentition in the operating room with general anesthesia. I will need to confirm plan of care with the patient as he was somewhat somnolent at the time of the examination. I also need to confirm when the patient would be medically stable for dental procedures as planned.  2. Discussion of findings with medical team and coordination of future medical and dental care as needed.    Charlynne Pander, DDS

## 2015-01-17 NOTE — Progress Notes (Signed)
Dwayne [name] for Infectious Disease  Day #9 abtx, Day #3 nafcillin  Subjective: C/o  persistent orthopnea, afebrile   Antibiotics:  Anti-infectives    Start     Dose/Rate Route Frequency Ordered Stop   01/15/15 1600  nafcillin 2 g in dextrose 5 % 50 mL IVPB  Status:  Discontinued     2 g100 mL/hr over 30 Minutes Intravenous 6 times per day 01/15/15 1503 01/15/15 1517   01/15/15 1600  nafcillin 2 g in dextrose 5 % 50 mL IVPB     2 g100 mL/hr over 30 Minutes Intravenous 6 times per day 01/15/15 1517     01/15/15 0930  nafcillin injection 2 g  Status:  Discontinued     2 g Intravenous Every 4 hours 01/15/15 0921 01/15/15 1503   01/14/15 1500  DAPTOmycin (CUBICIN) 600 mg in sodium chloride 0.9 % IVPB  Status:  Discontinued     600 mg224 mL/hr over 30 Minutes Intravenous Every 48 hours 01/12/15 1421 01/13/15 1239   01/13/15 1500  ceFAZolin (ANCEF) IVPB 2 g/50 mL premix  Status:  Discontinued     2 g100 mL/hr over 30 Minutes Intravenous 3 times per day 01/13/15 1407 01/15/15 0921   01/12/15 1600  ceFAZolin (ANCEF) IVPB 2 g/50 mL premix  Status:  Discontinued     2 g100 mL/hr over 30 Minutes Intravenous Every 12 hours 01/12/15 1419 01/13/15 1407   01/11/15 1200  DAPTOmycin (CUBICIN) 600 mg in sodium chloride 0.9 % IVPB  Status:  Discontinued     600 mg224 mL/hr over 30 Minutes Intravenous Every 24 hours 01/11/15 1003 01/12/15 1421   01/10/15 1200  vancomycin (VANCOCIN) IVPB 1000 mg/200 mL premix  Status:  Discontinued     1,000 mg200 mL/hr over 60 Minutes Intravenous Every 8 hours 01/10/15 1044 01/11/15 0947   01/10/15 1100  piperacillin-tazobactam (ZOSYN) IVPB 3.375 g  Status:  Discontinued     3.375 g12.5 mL/hr over 240 Minutes Intravenous Every 8 hours 01/10/15 1043 01/11/15 0902   01/09/15 0700  metroNIDAZOLE (FLAGYL) IVPB 500 mg  Status:  Discontinued     500 mg100 mL/hr over 60 Minutes Intravenous Every 8 hours 01/09/15 0651 01/10/15 1018      Medications: Scheduled Meds: .  bisacodyl  10 mg Rectal Once  . chlorhexidine  15 mL Mouth/Throat BID  . feeding supplement (GLUCERNA SHAKE)  237 mL Oral BID BM  . feeding supplement (PRO-STAT SUGAR FREE 64)  30 mL Oral Q1500  . hydrALAZINE  25 mg Oral 3 times per day  . Influenza vac split quadrivalent PF  0.5 mL Intramuscular Tomorrow-1000  . insulin aspart  0-15 Units Subcutaneous QAC lunch  . insulin aspart  0-5 Units Subcutaneous QHS  . insulin aspart protamine- aspart  28 Units Subcutaneous BID WC  . living well with diabetes book   Does not apply Once  . metoprolol tartrate  50 mg Oral BID  . nafcillin IV  2 g Intravenous 6 times per day  . neomycin-bacitracin-polymyxin   Topical Daily  . nicotine  7 mg Transdermal Daily  . sodium chloride  500 mL Intravenous Once    Objective: Weight change:   Intake/Output Summary (Last 24 hours) at 01/17/15 1358 Last data filed at 01/17/15 1356  Gross per 24 hour  Intake 4631.67 ml  Output    600 ml  Net 4031.67 ml   Blood pressure 159/96, pulse 93, temperature 99.7 F (37.6 C), temperature source Oral, resp. rate 18,  height 6' 2"  (1.88 m), weight 242 lb 9.6 oz (110.043 kg), SpO2 97 %. Temp:  [98.5 F (36.9 C)-99.7 F (37.6 C)] 99.7 F (37.6 C) (01/18 1300) Pulse Rate:  [84-93] 93 (01/18 1300) Resp:  [16-18] 18 (01/18 1300) BP: (145-159)/(83-96) 159/96 mmHg (01/18 1300) SpO2:  [95 %-100 %] 97 % (01/18 1300) Weight:  [242 lb 9.6 oz (110.043 kg)] 242 lb 9.6 oz (110.043 kg) (01/18 1100)  Physical Exam: Did not examine CBC:   CBC Latest Ref Rng 01/17/2015 01/16/2015 01/15/2015  WBC 4.0 - 10.5 K/uL 13.7(H) 12.2(H) 12.4(H)  Hemoglobin 13.0 - 17.0 g/dL 9.5(L) 9.1(L) 9.8(L)  Hematocrit 39.0 - 52.0 % 28.7(L) 27.0(L) 29.3(L)  Platelets 150 - 400 K/uL 387 372 411(H)     BMET  Recent Labs  01/16/15 0509 01/17/15 0535  NA 135 132*  K 3.6 3.6  CL 104 105  CO2 23 23  GLUCOSE 294* 228*  BUN 23 15  CREATININE 1.99* 1.86*  CALCIUM 7.5* 7.6*     Liver  Panel   Recent Labs  01/16/15 0509 01/17/15 0535  PROT 5.5* 6.0  ALBUMIN 1.2* 1.2*  AST 42* 29  ALT 19 17  ALKPHOS 233* 209*  BILITOT 0.9 1.2    Micro Results: Recent Results (from the past 720 hour(s))  Urine culture     Status: None   Collection Time: 01/09/15  1:31 AM  Result Value Ref Range Status   Specimen Description URINE, CLEAN CATCH  Final   Special Requests NONE  Final   Colony Count   Final    3,000 COLONIES/ML Performed at [facility]    Culture   Final    INSIGNIFICANT GROWTH Performed at [facility]    Report Status 01/10/2015 FINAL  Final  Stool culture     Status: None   Collection Time: 01/09/15  4:09 AM  Result Value Ref Range Status   Specimen Description VAGINAL/RECTAL  Final   Special Requests Normal  Final   Culture   Final    NO SALMONELLA, SHIGELLA, CAMPYLOBACTER, YERSINIA, OR E.COLI 0157:H7 ISOLATED Performed at [facility]    Report Status 01/12/2015 FINAL  Final  Clostridium Difficile by PCR     Status: None   Collection Time: 01/09/15  4:09 AM  Result Value Ref Range Status   C difficile by pcr NEGATIVE NEGATIVE Final  Culture, blood (routine x 2)     Status: None   Collection Time: 01/10/15  4:35 AM  Result Value Ref Range Status   Specimen Description BLOOD RIGHT HAND  Final   Special Requests BOTTLES DRAWN AEROBIC AND ANAEROBIC 10CC EA  Final   Culture   Final    STAPHYLOCOCCUS AUREUS Note: SUSCEPTIBILITIES PERFORMED ON PREVIOUS CULTURE WITHIN THE LAST 5 DAYS. Note: Culture results may be compromised due to an excessive volume of blood received in culture bottles. Gram Stain Report Called to,Read Back By and Verified With:  MONICA YIM ON 01/10/2015 AT 11:36P BY WILEJ Performed at [facility]    Report Status 01/13/2015 FINAL  Final  Culture, blood (routine x 2)     Status: None   Collection Time: 01/10/15  4:46 AM  Result Value Ref Range Status   Specimen Description BLOOD LEFT ARM  Final    Special Requests BOTTLES DRAWN AEROBIC AND ANAEROBIC 10CC EA  Final   Culture   Final    STAPHYLOCOCCUS AUREUS Note: SUSCEPTIBILITIES PERFORMED ON PREVIOUS CULTURE WITHIN THE LAST 5 DAYS. Note:  Culture results may be compromised due to an excessive volume of blood received in culture bottles. Gram Stain Report Called to,Read Back By and Verified With: MONICA YIM ON 01/10/2015 AT 11:36P BY WILEJ Performed at [facility]    Report Status 01/13/2015 FINAL  Final  Respiratory virus panel (routine influenza)     Status: None   Collection Time: 01/10/15 10:34 AM  Result Value Ref Range Status   Source - RVPAN NASAL SWAB  Corrected    Comment: CORRECTED ON 01/12 AT 2036: PREVIOUSLY REPORTED AS NASAL SWAB   Respiratory Syncytial Virus A NOT DETECTED  Final   Respiratory Syncytial Virus B NOT DETECTED  Final   Influenza A NOT DETECTED  Final   Influenza B NOT DETECTED  Final   Parainfluenza 1 NOT DETECTED  Final   Parainfluenza 2 NOT DETECTED  Final   Parainfluenza 3 NOT DETECTED  Final   Metapneumovirus NOT DETECTED  Final   Rhinovirus NOT DETECTED  Final   Adenovirus NOT DETECTED  Final   Influenza A H1 NOT DETECTED  Final   Influenza A H3 NOT DETECTED  Final    Comment: (NOTE)       Normal Reference Range for each Analyte: NOT DETECTED Testing performed using the Luminex xTAG Respiratory Viral Panel test kit. The analytical performance characteristics of this assay have been determined by [facility].  The modifications have not been cleared or approved by the FDA. This assay has been validated pursuant to the CLIA regulations and is used for clinical purposes. Performed at [facility], blood (routine x 2)     Status: None   Collection Time: 01/10/15 11:19 AM  Result Value Ref Range Status   Specimen Description BLOOD LEFT FOREARM  Final   Special Requests BOTTLES DRAWN AEROBIC ONLY 5CC  Final   Culture   Final    STAPHYLOCOCCUS AUREUS Note:  RIFAMPIN AND GENTAMICIN SHOULD NOT BE USED AS SINGLE DRUGS FOR TREATMENT OF STAPH INFECTIONS. Note: Gram Stain Report Called to,Read Back By and Verified With: NURSE MONICA YIM 01/11/15 6:05AM THOMI Performed at [facility]    Report Status 01/13/2015 FINAL  Final   Organism ID, Bacteria STAPHYLOCOCCUS AUREUS  Final      Susceptibility   Staphylococcus aureus - MIC*    CLINDAMYCIN <=0.25 SENSITIVE Sensitive     ERYTHROMYCIN <=0.25 SENSITIVE Sensitive     GENTAMICIN <=0.5 SENSITIVE Sensitive     LEVOFLOXACIN 4 INTERMEDIATE Intermediate     OXACILLIN 0.5 SENSITIVE Sensitive     PENICILLIN 0.12 SENSITIVE Sensitive     RIFAMPIN 2 INTERMEDIATE Intermediate     TRIMETH/SULFA <=10 SENSITIVE Sensitive     VANCOMYCIN 1 SENSITIVE Sensitive     TETRACYCLINE <=1 SENSITIVE Sensitive     MOXIFLOXACIN 2 RESISTANT Resistant     * STAPHYLOCOCCUS AUREUS  Culture, blood (routine x 2)     Status: None   Collection Time: 01/10/15 11:27 AM  Result Value Ref Range Status   Specimen Description BLOOD LEFT ANTECUBITAL  Final   Special Requests BOTTLES DRAWN AEROBIC ONLY [place]  Final   Culture   Final    STAPHYLOCOCCUS AUREUS Note: SUSCEPTIBILITIES PERFORMED ON PREVIOUS CULTURE WITHIN THE LAST 5 DAYS. Note: Gram Stain Report Called to,Read Back By and Verified With: NURSE MONICA YIM 01/11/15 6:05AM THOMI Performed at [facility]    Report Status 01/13/2015 FINAL  Final  Culture, blood (routine x 2)  Status: None (Preliminary result)   Collection Time: 01/13/15  7:20 PM  Result Value Ref Range Status   Specimen Description BLOOD RIGHT ARM  Final   Special Requests BOTTLES DRAWN AEROBIC AND ANAEROBIC 5CC  Final   Culture   Final           BLOOD CULTURE RECEIVED NO GROWTH TO DATE CULTURE WILL BE HELD FOR 5 DAYS BEFORE ISSUING A FINAL NEGATIVE REPORT Performed at [facility]    Report Status PENDING  Incomplete  Culture, blood (routine x 2)     Status: None (Preliminary result)     Collection Time: 01/13/15  7:20 PM  Result Value Ref Range Status   Specimen Description BLOOD RIGHT ARM  Final   Special Requests BOTTLES DRAWN AEROBIC AND ANAEROBIC 3CC  Final   Culture   Final           BLOOD CULTURE RECEIVED NO GROWTH TO DATE CULTURE WILL BE HELD FOR 5 DAYS BEFORE ISSUING A FINAL NEGATIVE REPORT Performed at [facility]    Report Status PENDING  Incomplete  Body fluid culture     Status: None   Collection Time: 01/14/15  9:02 AM  Result Value Ref Range Status   Specimen Description FLUID RIGHT SHOULDER SYNOVIAL  Final   Special Requests NONE  Final   Gram Stain   Final    FEW WBC PRESENT,BOTH PMN AND MONONUCLEAR NO ORGANISMS SEEN Performed at [facility]    Culture   Final    NO GROWTH 3 DAYS Performed at [facility]    Report Status 01/17/2015 FINAL  Final    Studies/Results: Mr Thoracic Spine Wo Contrast  01/15/2015   ADDENDUM REPORT: 01/15/2015 14:40  ADDENDUM: Study discussed by telephone with Dr. Rhina Brackett DAM on 01/15/2015 at 1435 hrs.   Electronically Signed   By: Lars Pinks M.D.   On: 01/15/2015 14:40   01/15/2015   CLINICAL DATA:  35 year old male with sepsis and back pain. Into card itis, purulent pericarditis, multiple small embolic infarcts. Initial encounter.  EXAM: MRI THORACIC AND LUMBAR SPINE WITHOUT CONTRAST  TECHNIQUE: Multiplanar and multiecho pulse sequences of the thoracic and lumbar spine were obtained without intravenous contrast.  COMPARISON:  Chest radiographs 01/14/2015. CT Abdomen and Pelvis 05/18/2013.  FINDINGS: MR THORACIC SPINE FINDINGS  Limited sagittal imaging of the cervical spine is unremarkable.  Preserved thoracic vertebral height and alignment, preserved thoracic vertebral height and alignment.  Throughout much of the T3 and T4 vertebral bodies there is heterogeneous T2 signal as well as patchy pre STIR signal. The STIR images were repeated, an the same patchy increased signal persisted at T3 and  T4.  No definite signal abnormality in the intervening disc, or adjacent disc spaces; there is widespread intervertebral disc T2 hyperintensity, as a normal finding seen in young patients.  No definite paraspinal or epidural inflammation at those levels.  No suspicious marrow signal elsewhere; intrinsic T1 and T2 hyperintensity at T6 is a benign vertebral body hemangioma.  However, there are layering pleural effusions left greater than right. Negative visualized upper abdominal viscera.  Epidural lipomatosis throughout the thoracic spine and effaces CSF from the thecal sac from the T5 to the T9 level. No definite spinal cord signal abnormality.  MR LUMBAR SPINE FINDINGS  In the lumbar spine T1-T2 and stir marrow signal appears diminished. This is also true in the visible pelvis. Possible associated decreased T2 signal in both the liver and spleen.  Otherwise  negative visualized abdominal viscera.  There is mildly increased STIR signal in the lower lumbar erector spinae muscles bilaterally, at L4-L5 (series 17, images 9 and 1). Subcutaneous edema incidentally noted.  Normal lumbar vertebral height and alignment and no marrow edema or acute osseous abnormality in the lumbar spine.  No definite signal abnormality in the lower thoracic spinal cord, conus at L1-L2.  Lumbar facet hypertrophy.  No lumbar spinal stenosis.  IMPRESSION: MR THORACIC SPINE IMPRESSION  1. Abnormal marrow edema in the T3 and T4 vertebral bodies highly suspicious for acute osteomyelitis in this setting. No associated epidural or paraspinal inflammation identified. 2. No other acute findings in the thoracic spine. Epidural lipomatosis. 3. Left greater than right layering pleural effusions.  MR LUMBAR SPINE IMPRESSION  1. Mild increased signal in the L4-L5 level erector spinae muscles bilaterally, which could be infectious myositis but also this appearance can be seen without infection in chronically debilitated patients. 2. No other acute finding  in the lumbar spine. 3. Decreased bone marrow signal suggestive of red marrow conversion versus hemosiderosis.  Electronically Signed: By: Lars Pinks M.D. On: 01/15/2015 14:20   Mr Lumbar Spine Wo Contrast  01/15/2015   ADDENDUM REPORT: 01/15/2015 14:40  ADDENDUM: Study discussed by telephone with Dr. Rhina Brackett DAM on 01/15/2015 at 1435 hrs.   Electronically Signed   By: Lars Pinks M.D.   On: 01/15/2015 14:40   01/15/2015   CLINICAL DATA:  35 year old male with sepsis and back pain. Into card itis, purulent pericarditis, multiple small embolic infarcts. Initial encounter.  EXAM: MRI THORACIC AND LUMBAR SPINE WITHOUT CONTRAST  TECHNIQUE: Multiplanar and multiecho pulse sequences of the thoracic and lumbar spine were obtained without intravenous contrast.  COMPARISON:  Chest radiographs 01/14/2015. CT Abdomen and Pelvis 05/18/2013.  FINDINGS: MR THORACIC SPINE FINDINGS  Limited sagittal imaging of the cervical spine is unremarkable.  Preserved thoracic vertebral height and alignment, preserved thoracic vertebral height and alignment.  Throughout much of the T3 and T4 vertebral bodies there is heterogeneous T2 signal as well as patchy pre STIR signal. The STIR images were repeated, an the same patchy increased signal persisted at T3 and T4.  No definite signal abnormality in the intervening disc, or adjacent disc spaces; there is widespread intervertebral disc T2 hyperintensity, as a normal finding seen in young patients.  No definite paraspinal or epidural inflammation at those levels.  No suspicious marrow signal elsewhere; intrinsic T1 and T2 hyperintensity at T6 is a benign vertebral body hemangioma.  However, there are layering pleural effusions left greater than right. Negative visualized upper abdominal viscera.  Epidural lipomatosis throughout the thoracic spine and effaces CSF from the thecal sac from the T5 to the T9 level. No definite spinal cord signal abnormality.  MR LUMBAR SPINE FINDINGS  In the  lumbar spine T1-T2 and stir marrow signal appears diminished. This is also true in the visible pelvis. Possible associated decreased T2 signal in both the liver and spleen.  Otherwise negative visualized abdominal viscera.  There is mildly increased STIR signal in the lower lumbar erector spinae muscles bilaterally, at L4-L5 (series 17, images 9 and 1). Subcutaneous edema incidentally noted.  Normal lumbar vertebral height and alignment and no marrow edema or acute osseous abnormality in the lumbar spine.  No definite signal abnormality in the lower thoracic spinal cord, conus at L1-L2.  Lumbar facet hypertrophy.  No lumbar spinal stenosis.  IMPRESSION: MR THORACIC SPINE IMPRESSION  1. Abnormal marrow edema in the T3 and T4  vertebral bodies highly suspicious for acute osteomyelitis in this setting. No associated epidural or paraspinal inflammation identified. 2. No other acute findings in the thoracic spine. Epidural lipomatosis. 3. Left greater than right layering pleural effusions.  MR LUMBAR SPINE IMPRESSION  1. Mild increased signal in the L4-L5 level erector spinae muscles bilaterally, which could be infectious myositis but also this appearance can be seen without infection in chronically debilitated patients. 2. No other acute finding in the lumbar spine. 3. Decreased bone marrow signal suggestive of red marrow conversion versus hemosiderosis.  Electronically Signed: By: Lars Pinks M.D. 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 Problems:   Tobacco abuse   Noncompliance   Dehydration   Diarrhea   Diabetes type 2, uncontrolled   Hyperglycemia   Uncontrolled hypertension   Diabetic foot ulcers   Pain   Diabetic ulcer of left foot associated with type 2 diabetes mellitus   Diabetic ulcer of right foot associated with type 2 diabetes mellitus   Osteomyelitis   Shoulder pain   Thigh pain, musculoskeletal   Septic shock    Staphylococcus aureus bacteremia with sepsis   Smoker   Elevated troponin   Acute renal failure   Bacteremia   Septic joint of right shoulder region   Infective myositis of left thigh   Purulent pericarditis   Mitral regurgitation   Endocarditis   Back pain   Poor dentition   Discitis of thoracic region   Congestive heart failure    Dwayne [name] is a 35 y.o. 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  Specific aspects of clinical management are critical to optimizing the outcome of patients with SAB.  Therefore, the [facility] Antimicrobial Management Team [facility]) has initiated an intervention aimed at improving the management of SAB at [facility].  To do so, Infectious Diseases physicians are providing an evidence-based consult for the management of all patients with SAB.     Yes No Comments  Perform follow-up blood cultures (even if the patient is afebrile) to ensure clearance of bacteremia [x]  []  1/14 blood cx NGTD  Remove vascular catheter and obtain follow-up blood cultures after the removal of the catheter []  []     Perform echocardiography to evaluate for endocarditis (transthoracic ECHO is 40-50% sensitive, TEE is > 90% sensitive) [x]  []  TEE SHOWS infectious aneurysm and perforation of the base of the medial scallop of the posterior mitral  leaflet. There is an associated 4x6 mm vegetation. There is severe mitral insufficiency through the perforation in the posterior leaflet with reversal of flow in both the left and right pulmonary veins        Ensure source control []  [x]  I AM NOT confident that we will control shoulder infection with apirations and abx alone   Investigate for "metastatic" sites of infection [x]  []  MRI  of brain has shown septic emboli MRI T and L spine shown T spine discitis , right shoulder involvement, MV endocarditis  Change antibiotic therapy to  naficillin []  []    Estimated duration of IV antibiotic therapy:     8 WEEKS  POST HEART SURGERY   []  []  Consult case management for probably prolonged outpatient IV antibiotic therapy     LOS: 8 days   Dwayne [name] 01/17/2015, 1:58 PM

## 2015-01-17 NOTE — Progress Notes (Signed)
Patient transferred to 2W. Report called and given to [place], Charity fundraiserN.  Patient refusing suppository prior to transfer.  RN made aware.

## 2015-01-17 NOTE — Progress Notes (Signed)
Utilization review completed.  

## 2015-01-17 NOTE — Progress Notes (Signed)
Inpatient Diabetes Program Recommendations  AACE/ADA: New Consensus Statement on Inpatient Glycemic Control (2013)  Target Ranges:  Prepandial:   less than 140 mg/dL      Peak postprandial:   less than 180 mg/dL (1-2 hours)      Critically ill patients:  140 - 180 mg/dL   Noted pt had good glucose control before he was eating on 70/30 at 25 units bid. Since eating has picked up, glucose levels have risen to avg in the 200's. Reviewing the amount of correction used to normalize glucose, the average dose around 6-8 units correction while in the 200's. Noted 70/30 increased today to 28 units bid, started this am. At 0.6 units per kg, pt would need a total of 66 units insulin per day. May need to increase 70/30 to 30 units bid (less than wt based but safe if not eating well), Will follow and reassess potential insulin needs while patient is here. (Pt was ordered in-pt education last week using videos and printed information as well as a dietician consult).  Thank you, Lenor CoffinAnn Riniyah Speich, RN, CNS, Diabetes Coordinator [phone])

## 2015-01-17 NOTE — Progress Notes (Addendum)
[facility] PROGRESS NOTE  Ilyas Lipsitz ZOX:[id number] DOB: Nov 24, 1980 DOA: 01/09/2015 PCP: No PCP Per Patient    Brief narrative: Dwayne [name] is a 35 y.o. male with a history of Uncontrolled HTN, DM2 who presents to the ED with complaints of diarrhea and fevers and chills x 24 hours. He reports having weakness and fatigue and He denies any nausea or vomiting. No one is sick at home, and he denies taking any recent antibiotic therapy. He reports that he has been out of his medications for months, and has not seen his PCP in months as well. 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    HPI/Subjective: Alert, sitting up in bed and has no complaints. Noted to have sugary drinks at his bedside (has a 2 L bottle of soda). Discussed the need to stay on a carb modified diet to prevent further hyperglycemia.   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 Algis Liming recommends 8 weeks of antibiotics post heart surgery and all other potential surgeries - seen by Dr. Barry Dienes with CT surgery-- He would prefer the bacteremia to clear up first, however, he is concerned about the degree his MR worsened in 2 days between TTE and TEE. May need earlier surgery his start to have HF symptom. - per Dr. Cornelius Moras, ideally need cardiac cath- Cr clearance prohibitive of this at this time - cardiology also following and assisting with management- given a dose of Lasix today for fluid overload  HTN - BP was poorly controlled - increased Lopressor to 50 bid and added oral hydralazine- cont to follow at current doses  Poorly controlled diabetes - appears to have a poor understand about the need to control oral glucose/carb intake- I have  spoken with him the dietary restrictions he needs to follow Hemoglobin A1c 8.1 Patient admits to being noncompliant with medications Increase 70/30 today to achieve better control As he is on 70/30, he should technically need a sliding scale at lunch only rather than TID - have modified this   Acute renal failure - improving - baseline appears to be 1.1 to 1.2  Right shoulder- septic arthritis/ synovitis  - s/p aspiration by IR -ID team recommended I and D to resolve infection -  medical management recommended by ortho for now as taking him to the OR with his currently infected valve would be too high risk  Acute osteomyelitis of T3T4 - noted on MRI to have increased signal which may be osteomyelitis  Myositis L4-L5 area and entire left leg  - noted on MRI  Septic emboli to the brain  Diarrhea C. difficile negative   Code Status: full Family Communication: family at bedside Disposition Plan:  As above   Consultants:  None  Procedures:  None  Antibiotics: Anti-infectives    Start     Dose/Rate Route Frequency Ordered Stop   01/15/15 1600  nafcillin 2 g in dextrose 5 % 50 mL IVPB  Status:  Discontinued     2 g100 mL/hr over 30 Minutes Intravenous 6 times per day 01/15/15 1503 01/15/15 1517   01/15/15 1600  nafcillin 2 g in dextrose 5 % 50 mL IVPB     2 g100 mL/hr over 30 Minutes Intravenous 6 times per day 01/15/15 1517  01/15/15 0930  nafcillin injection 2 g  Status:  Discontinued     2 g Intravenous Every 4 hours 01/15/15 0921 01/15/15 1503   01/14/15 1500  DAPTOmycin (CUBICIN) 600 mg in sodium chloride 0.9 % IVPB  Status:  Discontinued     600 mg224 mL/hr over 30 Minutes Intravenous Every 48 hours 01/12/15 1421 01/13/15 1239   01/13/15 1500  ceFAZolin (ANCEF) IVPB 2 g/50 mL premix  Status:  Discontinued     2 g100 mL/hr over 30 Minutes Intravenous 3 times per day 01/13/15 1407 01/15/15 0921   01/12/15 1600  ceFAZolin (ANCEF) IVPB 2 g/50 mL premix   Status:  Discontinued     2 g100 mL/hr over 30 Minutes Intravenous Every 12 hours 01/12/15 1419 01/13/15 1407   01/11/15 1200  DAPTOmycin (CUBICIN) 600 mg in sodium chloride 0.9 % IVPB  Status:  Discontinued     600 mg224 mL/hr over 30 Minutes Intravenous Every 24 hours 01/11/15 1003 01/12/15 1421   01/10/15 1200  vancomycin (VANCOCIN) IVPB 1000 mg/200 mL premix  Status:  Discontinued     1,000 mg200 mL/hr over 60 Minutes Intravenous Every 8 hours 01/10/15 1044 01/11/15 0947   01/10/15 1100  piperacillin-tazobactam (ZOSYN) IVPB 3.375 g  Status:  Discontinued     3.375 g12.5 mL/hr over 240 Minutes Intravenous Every 8 hours 01/10/15 1043 01/11/15 0902   01/09/15 0700  metroNIDAZOLE (FLAGYL) IVPB 500 mg  Status:  Discontinued     500 mg100 mL/hr over 60 Minutes Intravenous Every 8 hours 01/09/15 0651 01/10/15 1018       Objective: Filed Vitals:   01/16/15 1604 01/16/15 2008 01/17/15 0549 01/17/15 0941  BP: 145/83 148/83 157/87 153/86  Pulse: 84 87 90 87  Temp: 99.1 F (37.3 C) 99.7 F (37.6 C) 98.5 F (36.9 C)   TempSrc: Oral Oral Oral   Resp: Height:      Weight:      SpO2: 100% 100% 95%     Intake/Output Summary (Last 24 hours) at 01/17/15 1049 Last data filed at 01/17/15 0600  Gross per 24 hour  Intake 4441.67 ml  Output      0 ml  Net 4441.67 ml    Exam:  General: alert & oriented x 3 In NAD  Cardiovascular: RRR, nl S1 s2 + murmur 2+ at right sternal border. Respiratory: Decreased breath sounds at the bases, scattered rhonchi, no crackles  Abdomen: soft +BS NT/ND, no masses palpable  Extremities: No cyanosis and no edema      Data Reviewed: Basic Metabolic Panel:  Recent Labs Lab 01/10/15 1121  01/11/15 0953  01/13/15 0503 01/14/15 0518 01/15/15 1540 01/16/15 0509 01/17/15 0535  NA  --   < >  --   < > 134* 131* 135 135 132*  K  --   < >  --   < > 3.1* 3.0* 3.3* 3.6 3.6  CL  --   < >  --   < > 101 102 104 104 105  CO2  --   < >  --   < > GLUCOSE  --   < >  --   < > 123* 241* 206* 294* 228*  BUN  --   < >  --   < > 37* 33* 27* 23 15  CREATININE  --   < >  --   < > 3.05* 2.66* 2.19* 1.99* 1.86*  CALCIUM  --   < >  --   < >  8.0* 7.7* 7.9* 7.5* 7.6*  MG 1.6  --  1.9  --  2.3  --   --   --   --   < > = values in this interval not displayed.  Liver Function Tests:  Recent Labs Lab 01/13/15 0503 01/14/15 0518 01/15/15 1540 01/16/15 0509 01/17/15 0535  AST 23 23 40* 42* 29  ALT 21 14 14 19 17   ALKPHOS 151* 211* 263* 233* 209*  BILITOT 0.2* 0.3 0.3 0.9 1.2  PROT 5.7* 5.6* 6.0 5.5* 6.0  ALBUMIN 1.4* 1.3* 1.3* 1.2* 1.2*   No results for input(s): LIPASE, AMYLASE in the last 168 hours. No results for input(s): AMMONIA in the last 168 hours.  CBC:  Recent Labs Lab 01/13/15 0503 01/14/15 0518 01/15/15 1540 01/16/15 0509 01/17/15 0535  WBC 13.4* 10.8* 12.4* 12.2* 13.7*  HGB 10.6* 9.9* 9.8* 9.1* 9.5*  HCT 31.4* 29.1* 29.3* 27.0* 28.7*  MCV 82.4 83.1 83.5 83.6 86.2  PLT 243 264 411* 372 387    Cardiac Enzymes:  Recent Labs Lab 01/10/15 2036 01/11/15 0235 01/12/15 0614 01/12/15 1130 01/12/15 1849  CKTOTAL  --   --  49  --   --   TROPONINI 0.33* 0.28* 0.84* 0.65* 0.47*   BNP (last 3 results) No results for input(s): PROBNP in the last 8760 hours.   CBG:  Recent Labs Lab 01/16/15 1117 01/16/15 1608 01/16/15 2123 01/17/15 0547 01/17/15 0820  GLUCAP 137* 154* 229* 252* 292*    Imaging results reviewed in detail.    Scheduled Meds: . chlorhexidine  15 mL Mouth/Throat BID  . feeding supplement (GLUCERNA SHAKE)  237 mL Oral BID BM  . feeding supplement (PRO-STAT SUGAR FREE 64)  30 mL Oral Q1500  . hydrALAZINE  25 mg Oral 3 times per day  . Influenza vac split quadrivalent PF  0.5 mL Intramuscular Tomorrow-1000  . insulin aspart  0-15 Units Subcutaneous QAC lunch  . insulin aspart  0-5 Units Subcutaneous QHS  . insulin aspart protamine- aspart  28 Units Subcutaneous BID WC  .  living well with diabetes book   Does not apply Once  . metoprolol tartrate  50 mg Oral BID  . nafcillin IV  2 g Intravenous 6 times per day  . neomycin-bacitracin-polymyxin   Topical Daily  . nicotine  7 mg Transdermal Daily  . sodium chloride  500 mL Intravenous Once   Continuous Infusions:    Time spent: 30 minutes   [facility] Surgery Center LLCRIZWAN,Marcell Pfeifer  [facility] Pager-  [email or web address], password [email or web address] 01/17/2015, 10:49 AM  LOS: 8 days

## 2015-01-18 DIAGNOSIS — M00811 Arthritis due to other bacteria, right shoulder: Secondary | ICD-10-CM

## 2015-01-18 DIAGNOSIS — I76 Septic arterial embolism: Secondary | ICD-10-CM

## 2015-01-18 DIAGNOSIS — M4644 Discitis, unspecified, thoracic region: Secondary | ICD-10-CM

## 2015-01-18 DIAGNOSIS — I059 Rheumatic mitral valve disease, unspecified: Secondary | ICD-10-CM

## 2015-01-18 DIAGNOSIS — D72829 Elevated white blood cell count, unspecified: Secondary | ICD-10-CM | POA: Insufficient documentation

## 2015-01-18 LAB — CBC
HCT: 29.6 % — ABNORMAL LOW (ref 39.0–52.0)
HEMOGLOBIN: 9.8 g/dL — AB (ref 13.0–17.0)
MCH: 28.5 pg (ref 26.0–34.0)
MCHC: 33.1 g/dL (ref 30.0–36.0)
MCV: 86 fL (ref 78.0–100.0)
Platelets: 506 10*3/uL — ABNORMAL HIGH (ref 150–400)
RBC: 3.44 MIL/uL — AB (ref 4.22–5.81)
RDW: 14.2 % (ref 11.5–15.5)
WBC: 20.2 10*3/uL — ABNORMAL HIGH (ref 4.0–10.5)

## 2015-01-18 LAB — COMPREHENSIVE METABOLIC PANEL
ALK PHOS: 202 U/L — AB (ref 39–117)
ALT: 16 U/L (ref 0–53)
AST: 22 U/L (ref 0–37)
Albumin: 1.3 g/dL — ABNORMAL LOW (ref 3.5–5.2)
Anion gap: 10 (ref 5–15)
BUN: 14 mg/dL (ref 6–23)
CALCIUM: 8.1 mg/dL — AB (ref 8.4–10.5)
CO2: 21 mmol/L (ref 19–32)
CREATININE: 2.13 mg/dL — AB (ref 0.50–1.35)
Chloride: 104 mEq/L (ref 96–112)
GFR calc non Af Amer: 39 mL/min — ABNORMAL LOW (ref >90)
GFR, EST AFRICAN AMERICAN: 45 mL/min — AB (ref >90)
Glucose, Bld: 67 mg/dL — ABNORMAL LOW (ref 70–99)
Potassium: 3.3 mmol/L — ABNORMAL LOW (ref 3.5–5.1)
Sodium: 135 mmol/L (ref 135–145)
Total Bilirubin: 1.6 mg/dL — ABNORMAL HIGH (ref 0.3–1.2)
Total Protein: 6.5 g/dL (ref 6.0–8.3)

## 2015-01-18 LAB — GLUCOSE, CAPILLARY
GLUCOSE-CAPILLARY: 138 mg/dL — AB (ref 70–99)
Glucose-Capillary: 102 mg/dL — ABNORMAL HIGH (ref 70–99)
Glucose-Capillary: 111 mg/dL — ABNORMAL HIGH (ref 70–99)
Glucose-Capillary: 113 mg/dL — ABNORMAL HIGH (ref 70–99)

## 2015-01-18 MED ORDER — FUROSEMIDE 10 MG/ML IJ SOLN
40.0000 mg | Freq: Two times a day (BID) | INTRAMUSCULAR | Status: DC
  Administered 2015-01-18 – 2015-01-21 (×7): 40 mg via INTRAVENOUS
  Filled 2015-01-18 (×10): qty 4

## 2015-01-18 MED ORDER — DEXTROSE 5 % IV SOLN
12.0000 10*6.[IU] | Freq: Two times a day (BID) | INTRAVENOUS | Status: AC
  Administered 2015-01-18 – 2015-01-19 (×2): 12 10*6.[IU] via INTRAVENOUS
  Filled 2015-01-18 (×3): qty 12

## 2015-01-18 NOTE — Progress Notes (Signed)
[place] for Infectious Disease  Day #10 abtx, Day #4 nafcillin  Subjective:  afebrile, evaluated by Dr. Lawana Chambers regarding dental procedure. Prior right shoulder pain improved  24hr events: labs reveal increase in WBC from 13 to 20K as well Cr.   Antibiotics:  Anti-infectives    Start     Dose/Rate Route Frequency Ordered Stop   01/18/15 2000  penicillin G potassium 12 Million Units in dextrose 5 % 500 mL continuous infusion     12 Million Units41.7 mL/hr over 12 Hours Intravenous Every 12 hours 01/18/15 1432     01/15/15 1600  nafcillin 2 g in dextrose 5 % 50 mL IVPB  Status:  Discontinued     2 g100 mL/hr over 30 Minutes Intravenous 6 times per day 01/15/15 1503 01/15/15 1517   01/15/15 1600  nafcillin 2 g in dextrose 5 % 50 mL IVPB     2 g100 mL/hr over 30 Minutes Intravenous 6 times per day 01/15/15 1517 01/18/15 1959   01/15/15 0930  nafcillin injection 2 g  Status:  Discontinued     2 g Intravenous Every 4 hours 01/15/15 0921 01/15/15 1503   01/14/15 1500  DAPTOmycin (CUBICIN) 600 mg in sodium chloride 0.9 % IVPB  Status:  Discontinued     600 mg224 mL/hr over 30 Minutes Intravenous Every 48 hours 01/12/15 1421 01/13/15 1239   01/13/15 1500  ceFAZolin (ANCEF) IVPB 2 g/50 mL premix  Status:  Discontinued     2 g100 mL/hr over 30 Minutes Intravenous 3 times per day 01/13/15 1407 01/15/15 0921   01/12/15 1600  ceFAZolin (ANCEF) IVPB 2 g/50 mL premix  Status:  Discontinued     2 g100 mL/hr over 30 Minutes Intravenous Every 12 hours 01/12/15 1419 01/13/15 1407   01/11/15 1200  DAPTOmycin (CUBICIN) 600 mg in sodium chloride 0.9 % IVPB  Status:  Discontinued     600 mg224 mL/hr over 30 Minutes Intravenous Every 24 hours 01/11/15 1003 01/12/15 1421   01/10/15 1200  vancomycin (VANCOCIN) IVPB 1000 mg/200 mL premix  Status:  Discontinued     1,000 mg200 mL/hr over 60 Minutes Intravenous Every 8 hours 01/10/15 1044 01/11/15 0947   01/10/15 1100  piperacillin-tazobactam (ZOSYN)  IVPB 3.375 g  Status:  Discontinued     3.375 g12.5 mL/hr over 240 Minutes Intravenous Every 8 hours 01/10/15 1043 01/11/15 0902   01/09/15 0700  metroNIDAZOLE (FLAGYL) IVPB 500 mg  Status:  Discontinued     500 mg100 mL/hr over 60 Minutes Intravenous Every 8 hours 01/09/15 0651 01/10/15 1018      Medications: Scheduled Meds: . bisacodyl  10 mg Rectal Once  . chlorhexidine  15 mL Mouth/Throat BID  . feeding supplement (GLUCERNA SHAKE)  237 mL Oral BID BM  . feeding supplement (PRO-STAT SUGAR FREE 64)  30 mL Oral Q1500  . furosemide  40 mg Intravenous BID  . hydrALAZINE  25 mg Oral 3 times per day  . Influenza vac split quadrivalent PF  0.5 mL Intramuscular Tomorrow-1000  . insulin aspart  0-15 Units Subcutaneous QAC lunch  . insulin aspart  0-5 Units Subcutaneous QHS  . insulin aspart protamine- aspart  28 Units Subcutaneous BID WC  . living well with diabetes book   Does not apply Once  . metoprolol tartrate  50 mg Oral BID  . nafcillin IV  2 g Intravenous 6 times per day  . neomycin-bacitracin-polymyxin   Topical Daily  . nicotine  7 mg Transdermal  Daily  . penicillin g continuous IV infusion  12 Million Units Intravenous Q12H  . sodium chloride  500 mL Intravenous Once    Objective: Weight change:   Intake/Output Summary (Last 24 hours) at 01/18/15 1530 Last data filed at 01/18/15 1300  Gross per 24 hour  Intake    480 ml  Output      3 ml  Net    477 ml   Blood pressure 144/83, pulse 90, temperature 98.6 F (37 C), temperature source Oral, resp. rate 18, height _0  (1.88 m), weight 242 lb 9.6 oz (110.043 kg), SpO2 99 %. Temp:  [98.4 F (36.9 C)-98.8 F (37.1 C)] 98.6 F (37 C) (01/19 1407) Pulse Rate:  [90-99] 90 (01/19 1407) Resp:  [18-20] 18 (01/19 1407) BP: (140-158)/(83-92) 144/83 mmHg (01/19 1407) SpO2:  [97 %-100 %] 99 % (01/19 1407)  Physical Exam: BP 144/83 mmHg  Pulse 90  Temp(Src) 98.6 F (37 C) (Oral)  Resp 18  Ht _1  (1.88 m)  Wt 242 lb 9.6  oz (110.043 kg)  BMI 31.13 kg/m2  SpO2 99%  Physical Exam  Constitutional: He is oriented to person, place, and time. He appears well-developed and well-nourished. No distress.  HENT:  Mouth/Throat: Oropharynx is clear and moist. No oropharyngeal exudate.  Cardiovascular: Normal rate, regular rhythm and normal heart sounds. Nl s1, s2, 1/2 HSM at apex Pulmonary/Chest: Effort normal and breath sounds normal. No respiratory distress. He has no wheezes.  Abdominal: Soft. Bowel sounds are normal. He exhibits no distension. There is no tenderness.  Lymphadenopathy:  He has no cervical adenopathy.  Neurological: He is alert and oriented to person, place, and time.  Skin: Skin is warm and dry. No rash noted. No erythema.  Ext: left ankle > right 2/2 hx of surgery. +1 edema lower extremities L > R Psychiatric: flat affect    CBC:   CBC Latest Ref Rng 01/18/2015 01/17/2015 01/16/2015  WBC 4.0 - 10.5 K/uL 20.2(H) 13.7(H) 12.2(H)  Hemoglobin 13.0 - 17.0 g/dL 9.8(L) 9.5(L) 9.1(L)  Hematocrit 39.0 - 52.0 % 29.6(L) 28.7(L) 27.0(L)  Platelets 150 - 400 K/uL 506(H) 387 372     BMET  Recent Labs  01/17/15 0535 01/18/15 0357  NA 132* 135  K 3.6 3.3*  CL 105 104  CO2 23 21  GLUCOSE 228* 67*  BUN 15 14  CREATININE 1.86* 2.13*  CALCIUM 7.6* 8.1*     Liver Panel   Recent Labs  01/17/15 0535 01/18/15 0357  PROT 6.0 6.5  ALBUMIN 1.2* 1.3*  AST 29 22  ALT 17 16  ALKPHOS 209* 202*  BILITOT 1.2 1.6*    Micro Results: Recent Results (from the past 720 hour(s))  Urine culture     Status: None   Collection Time: 01/09/15  1:31 AM  Result Value Ref Range Status   Specimen Description URINE, CLEAN CATCH  Final   Special Requests NONE  Final   Colony Count   Final    3,000 COLONIES/ML Performed at [facility]    Culture   Final    INSIGNIFICANT GROWTH Performed at [facility]    Report Status 01/10/2015 FINAL  Final  Stool culture     Status: None    Collection Time: 01/09/15  4:09 AM  Result Value Ref Range Status   Specimen Description VAGINAL/RECTAL  Final   Special Requests Normal  Final   Culture   Final    NO SALMONELLA, SHIGELLA, CAMPYLOBACTER, YERSINIA, OR  E.COLI 0157:H7 ISOLATED Performed at [facility]    Report Status 01/12/2015 FINAL  Final  Clostridium Difficile by PCR     Status: None   Collection Time: 01/09/15  4:09 AM  Result Value Ref Range Status   C difficile by pcr NEGATIVE NEGATIVE Final  Culture, blood (routine x 2)     Status: None   Collection Time: 01/10/15  4:35 AM  Result Value Ref Range Status   Specimen Description BLOOD RIGHT HAND  Final   Special Requests BOTTLES DRAWN AEROBIC AND ANAEROBIC 10CC EA  Final   Culture   Final    STAPHYLOCOCCUS AUREUS Note: SUSCEPTIBILITIES PERFORMED ON PREVIOUS CULTURE WITHIN THE LAST 5 DAYS. Note: Culture results may be compromised due to an excessive volume of blood received in culture bottles. Gram Stain Report Called to,Read Back By and Verified With:  MONICA YIM ON 01/10/2015 AT 11:36P BY WILEJ Performed at [facility]    Report Status 01/13/2015 FINAL  Final  Culture, blood (routine x 2)     Status: None   Collection Time: 01/10/15  4:46 AM  Result Value Ref Range Status   Specimen Description BLOOD LEFT ARM  Final   Special Requests BOTTLES DRAWN AEROBIC AND ANAEROBIC 10CC EA  Final   Culture   Final    STAPHYLOCOCCUS AUREUS Note: SUSCEPTIBILITIES PERFORMED ON PREVIOUS CULTURE WITHIN THE LAST 5 DAYS. Note: Culture results may be compromised due to an excessive volume of blood received in culture bottles. Gram Stain Report Called to,Read Back By and Verified With: MONICA YIM ON 01/10/2015 AT 11:36P BY WILEJ Performed at [facility]    Report Status 01/13/2015 FINAL  Final  Respiratory virus panel (routine influenza)     Status: None   Collection Time: 01/10/15 10:34 AM  Result Value Ref Range Status   Source - RVPAN NASAL SWAB   Corrected    Comment: CORRECTED ON 01/12 AT 2036: PREVIOUSLY REPORTED AS NASAL SWAB   Respiratory Syncytial Virus A NOT DETECTED  Final   Respiratory Syncytial Virus B NOT DETECTED  Final   Influenza A NOT DETECTED  Final   Influenza B NOT DETECTED  Final   Parainfluenza 1 NOT DETECTED  Final   Parainfluenza 2 NOT DETECTED  Final   Parainfluenza 3 NOT DETECTED  Final   Metapneumovirus NOT DETECTED  Final   Rhinovirus NOT DETECTED  Final   Adenovirus NOT DETECTED  Final   Influenza A H1 NOT DETECTED  Final   Influenza A H3 NOT DETECTED  Final    Comment: (NOTE)       Normal Reference Range for each Analyte: NOT DETECTED Testing performed using the Luminex xTAG Respiratory Viral Panel test kit. The analytical performance characteristics of this assay have been determined by [facility].  The modifications have not been cleared or approved by the FDA. This assay has been validated pursuant to the CLIA regulations and is used for clinical purposes. Performed at [facility], blood (routine x 2)     Status: None   Collection Time: 01/10/15 11:19 AM  Result Value Ref Range Status   Specimen Description BLOOD LEFT FOREARM  Final   Special Requests BOTTLES DRAWN AEROBIC ONLY 5CC  Final   Culture   Final    STAPHYLOCOCCUS AUREUS Note: RIFAMPIN AND GENTAMICIN SHOULD NOT BE USED AS SINGLE DRUGS FOR TREATMENT OF STAPH INFECTIONS. Note: Gram Stain Report Called to,Read Back By and Verified With: NURSE  MONICA YIM 01/11/15 6:05AM THOMI Performed at [facility]    Report Status 01/13/2015 FINAL  Final   Organism ID, Bacteria STAPHYLOCOCCUS AUREUS  Final      Susceptibility   Staphylococcus aureus - MIC*    CLINDAMYCIN <=0.25 SENSITIVE Sensitive     ERYTHROMYCIN <=0.25 SENSITIVE Sensitive     GENTAMICIN <=0.5 SENSITIVE Sensitive     LEVOFLOXACIN 4 INTERMEDIATE Intermediate     OXACILLIN 0.5 SENSITIVE Sensitive     PENICILLIN 0.12 SENSITIVE Sensitive      RIFAMPIN 2 INTERMEDIATE Intermediate     TRIMETH/SULFA <=10 SENSITIVE Sensitive     VANCOMYCIN 1 SENSITIVE Sensitive     TETRACYCLINE <=1 SENSITIVE Sensitive     MOXIFLOXACIN 2 RESISTANT Resistant     * STAPHYLOCOCCUS AUREUS  Culture, blood (routine x 2)     Status: None   Collection Time: 01/10/15 11:27 AM  Result Value Ref Range Status   Specimen Description BLOOD LEFT ANTECUBITAL  Final   Special Requests BOTTLES DRAWN AEROBIC ONLY [place]  Final   Culture   Final    STAPHYLOCOCCUS AUREUS Note: SUSCEPTIBILITIES PERFORMED ON PREVIOUS CULTURE WITHIN THE LAST 5 DAYS. Note: Gram Stain Report Called to,Read Back By and Verified With: NURSE MONICA YIM 01/11/15 6:05AM THOMI Performed at [facility]    Report Status 01/13/2015 FINAL  Final  Culture, blood (routine x 2)     Status: None (Preliminary result)   Collection Time: 01/13/15  7:20 PM  Result Value Ref Range Status   Specimen Description BLOOD RIGHT ARM  Final   Special Requests BOTTLES DRAWN AEROBIC AND ANAEROBIC 5CC  Final   Culture   Final           BLOOD CULTURE RECEIVED NO GROWTH TO DATE CULTURE WILL BE HELD FOR 5 DAYS BEFORE ISSUING A FINAL NEGATIVE REPORT Performed at [facility]    Report Status PENDING  Incomplete  Culture, blood (routine x 2)     Status: None (Preliminary result)   Collection Time: 01/13/15  7:20 PM  Result Value Ref Range Status   Specimen Description BLOOD RIGHT ARM  Final   Special Requests BOTTLES DRAWN AEROBIC AND ANAEROBIC 3CC  Final   Culture   Final           BLOOD CULTURE RECEIVED NO GROWTH TO DATE CULTURE WILL BE HELD FOR 5 DAYS BEFORE ISSUING A FINAL NEGATIVE REPORT Performed at [facility]    Report Status PENDING  Incomplete  Body fluid culture     Status: None   Collection Time: 01/14/15  9:02 AM  Result Value Ref Range Status   Specimen Description FLUID RIGHT SHOULDER SYNOVIAL  Final   Special Requests NONE  Final   Gram Stain   Final    FEW WBC  PRESENT,BOTH PMN AND MONONUCLEAR NO ORGANISMS SEEN Performed at [facility]    Culture   Final    NO GROWTH 3 DAYS Performed at [facility]    Report Status 01/17/2015 FINAL  Final     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 Problems:   Tobacco abuse   Noncompliance   Dehydration   Diarrhea   Diabetes type 2, uncontrolled   Hyperglycemia   Uncontrolled hypertension   Diabetic foot ulcers   Pain   Diabetic ulcer of left foot associated with type 2 diabetes mellitus   Diabetic ulcer of right foot associated with type 2 diabetes mellitus  Osteomyelitis   Shoulder pain   Thigh pain, musculoskeletal   Septic shock   Staphylococcus aureus bacteremia with sepsis   Smoker   Elevated troponin   Acute renal failure   Bacteremia   Septic joint of right shoulder region   Infective myositis of left thigh   Purulent pericarditis   Mitral regurgitation   Endocarditis   Back pain   Poor dentition   Discitis of thoracic region   Congestive heart failure    Dwayne [name] is a 35 y.o. 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 We will change him over to penicillin, (MSSA is sensitive to PCN) in the meantime to see if it makes any difference with Cr function - leukocytosis = is worrisome, will add differential, and repeat blood cultures. He denies dysuria, back pain or shoulder pain - poor dentition = agree that he should undergo extraction before proceeding to valve surgery   LOS: 9 days   Dwayne [name], Dwayne [name] 01/18/2015, 3:30 PM

## 2015-01-18 NOTE — Progress Notes (Signed)
Patient ID: Dwayne HickmanBenjamin [name], male   DOB: 02/25/1980, 35 y.o.   MRN: [id number] [name] states that his right shoulder now barely hurts and is much better than it was a week ago.  He is able to lift it above his head on his own effortlessly and easily.  He then rested his arm behind his head.  I compressed his shoulder with no problems at all and he showed no outward signs of discomfort.  No further recs for his right shoulder at all, which fortunately has improved significantly compared to his other issues.

## 2015-01-18 NOTE — Progress Notes (Signed)
[facility] PROGRESS NOTE  Dwayne HickmanBenjamin [name] ZOX:[id number] DOB: 02/23/1980 DOA: 01/09/2015 PCP: No PCP Per Patient    Brief narrative: Dwayne [name] is a 35 y.o. male with a history of Uncontrolled HTN, DM2 who presents to the ED with complaints of diarrhea and fevers and chills x 24 hours. He reports having weakness and fatigue and He denies any nausea or vomiting. No one is sick at home, and he denies taking any recent antibiotic therapy. He reports that he has been out of his medications for months, and has not seen his PCP in months as well. 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    HPI/Subjective: Ambulating in the room. Left thigh hurt but this is not new. No new symptoms.   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 Algis LimingVanDam recommends 8 weeks of antibiotics post heart surgery and all other potential surgeries - seen by Dr. Barry Dieneswens with CT surgery-- He would prefer the bacteremia to clear up first, however, he is concerned about the degree his MR worsened in 2 days between TTE and TEE. May need earlier surgery his start to have HF symptom. - per Dr. Cornelius Moraswen, ideally need cardiac cath- Cr clearance prohibitive of this at this time - cardiology also following and assisting with management- given a dose of Lasix today for fluid overload - WBC up today - management per ID  Dental caries - will need to call dentist for extraction prior to valve replacement  HTN - BP was poorly controlled - increased Lopressor to 50 bid and added oral hydralazine- cont to follow at current doses  Poorly controlled diabetes - appears to have a poor understand about the need to control oral glucose/carb intake- I have spoken  with him the dietary restrictions he needs to follow Hemoglobin A1c 8.1 Patient admits to being noncompliant with medications Increased 70/30 to achieve better control- now sugars improved As he is on 70/30, he should technically need a sliding scale at lunch only rather than TID - have modified this   Acute renal failure - improving - cr was improve but worse today- may be related to sepsis and increasing WBC count- also received Lasix yesterday  Right shoulder- septic arthritis/ synovitis  - s/p aspiration by IR -ID team recommended I and D to resolve infection -  medical management recommended by ortho for now as taking him to the OR with his currently infected valve would be too high risk  Acute osteomyelitis of T3T4 - noted on MRI to have increased signal which may be osteomyelitis  Myositis L4-L5 area and entire left leg  - noted on MRI  Septic emboli to the brain  Diarrhea C. difficile negative   Code Status: full Family Communication: family at bedside Disposition Plan:  As above   Consultants:  None  Procedures:  None  Antibiotics: Anti-infectives    Start     Dose/Rate Route Frequency Ordered Stop   01/15/15 1600  nafcillin 2 g in dextrose 5 % 50 mL IVPB  Status:  Discontinued     2 g100 mL/hr over 30 Minutes Intravenous 6 times per day 01/15/15 1503 01/15/15 1517   01/15/15 1600  nafcillin 2 g in dextrose 5 % 50 mL IVPB     2  g100 mL/hr over 30 Minutes Intravenous 6 times per day 01/15/15 1517     01/15/15 0930  nafcillin injection 2 g  Status:  Discontinued     2 g Intravenous Every 4 hours 01/15/15 0921 01/15/15 1503   01/14/15 1500  DAPTOmycin (CUBICIN) 600 mg in sodium chloride 0.9 % IVPB  Status:  Discontinued     600 mg224 mL/hr over 30 Minutes Intravenous Every 48 hours 01/12/15 1421 01/13/15 1239   01/13/15 1500  ceFAZolin (ANCEF) IVPB 2 g/50 mL premix  Status:  Discontinued     2 g100 mL/hr over 30 Minutes Intravenous 3 times per day  01/13/15 1407 01/15/15 0921   01/12/15 1600  ceFAZolin (ANCEF) IVPB 2 g/50 mL premix  Status:  Discontinued     2 g100 mL/hr over 30 Minutes Intravenous Every 12 hours 01/12/15 1419 01/13/15 1407   01/11/15 1200  DAPTOmycin (CUBICIN) 600 mg in sodium chloride 0.9 % IVPB  Status:  Discontinued     600 mg224 mL/hr over 30 Minutes Intravenous Every 24 hours 01/11/15 1003 01/12/15 1421   01/10/15 1200  vancomycin (VANCOCIN) IVPB 1000 mg/200 mL premix  Status:  Discontinued     1,000 mg200 mL/hr over 60 Minutes Intravenous Every 8 hours 01/10/15 1044 01/11/15 0947   01/10/15 1100  piperacillin-tazobactam (ZOSYN) IVPB 3.375 g  Status:  Discontinued     3.375 g12.5 mL/hr over 240 Minutes Intravenous Every 8 hours 01/10/15 1043 01/11/15 0902   01/09/15 0700  metroNIDAZOLE (FLAGYL) IVPB 500 mg  Status:  Discontinued     500 mg100 mL/hr over 60 Minutes Intravenous Every 8 hours 01/09/15 0651 01/10/15 1018       Objective: Filed Vitals:   01/17/15 1300 01/17/15 1532 01/17/15 2026 01/18/15 0431  BP: 159/96 140/83 158/87 156/92  Pulse: 93 91 99 96  Temp: 99.7 F (37.6 C) 98.8 F (37.1 C) 98.8 F (37.1 C) 98.4 F (36.9 C)  TempSrc:  Oral Oral Oral  Resp: Height:      Weight:      SpO2: 97% 100% 97% 99%    Intake/Output Summary (Last 24 hours) at 01/18/15 1328 Last data filed at 01/18/15 0702  Gross per 24 hour  Intake    240 ml  Output    602 ml  Net   -362 ml    Exam:  General: alert & oriented x 3 In NAD  Cardiovascular: RRR, nl S1 s2 + murmur 2+ at right sternal border. Respiratory: Decreased breath sounds at the bases, scattered rhonchi, no crackles  Abdomen: soft +BS NT/ND, no masses palpable  Extremities: No cyanosis and no edema      Data Reviewed: Basic Metabolic Panel:  Recent Labs Lab 01/13/15 0503 01/14/15 0518 01/15/15 1540 01/16/15 0509 01/17/15 0535 01/18/15 0357  NA 134* 131* 135 135 132* 135  K 3.1* 3.0* 3.3* 3.6 3.6 3.3*  CL 101 102 104  104 105 104  CO2 GLUCOSE 123* 241* 206* 294* 228* 67*  BUN 37* 33* 27* CREATININE 3.05* 2.66* 2.19* 1.99* 1.86* 2.13*  CALCIUM 8.0* 7.7* 7.9* 7.5* 7.6* 8.1*  MG 2.3  --   --   --   --   --     Liver Function Tests:  Recent Labs Lab 01/14/15 0518 01/15/15 1540 01/16/15 0509 01/17/15 0535 01/18/15 0357  AST 23 40* 42* 29 22  ALT 16  ALKPHOS 211* 263* 233* 209* 202*  BILITOT 0.3 0.3 0.9 1.2 1.6*  PROT 5.6* 6.0 5.5* 6.0 6.5  ALBUMIN 1.3* 1.3* 1.2* 1.2* 1.3*   No results for input(s): LIPASE, AMYLASE in the last 168 hours. No results for input(s): AMMONIA in the last 168 hours.  CBC:  Recent Labs Lab 01/14/15 0518 01/15/15 1540 01/16/15 0509 01/17/15 0535 01/18/15 0357  WBC 10.8* 12.4* 12.2* 13.7* 20.2*  HGB 9.9* 9.8* 9.1* 9.5* 9.8*  HCT 29.1* 29.3* 27.0* 28.7* 29.6*  MCV 83.1 83.5 83.6 86.2 86.0  PLT 264 411* 372 387 506*    Cardiac Enzymes:  Recent Labs Lab 01/12/15 0614 01/12/15 1130 01/12/15 1849  CKTOTAL 49  --   --   TROPONINI 0.84* 0.65* 0.47*   BNP (last 3 results) No results for input(s): PROBNP in the last 8760 hours.   CBG:  Recent Labs Lab 01/17/15 1156 01/17/15 1609 01/17/15 2134 01/18/15 0659 01/18/15 1120  GLUCAP 211* 127* 125* 102* 138*    Imaging results reviewed in detail.    Scheduled Meds: . bisacodyl  10 mg Rectal Once  . chlorhexidine  15 mL Mouth/Throat BID  . feeding supplement (GLUCERNA SHAKE)  237 mL Oral BID BM  . feeding supplement (PRO-STAT SUGAR FREE 64)  30 mL Oral Q1500  . furosemide  40 mg Intravenous BID  . hydrALAZINE  25 mg Oral 3 times per day  . Influenza vac split quadrivalent PF  0.5 mL Intramuscular Tomorrow-1000  . insulin aspart  0-15 Units Subcutaneous QAC lunch  . insulin aspart  0-5 Units Subcutaneous QHS  . insulin aspart protamine- aspart  28 Units Subcutaneous BID WC  . living well with diabetes book   Does not apply Once  . metoprolol tartrate  50  mg Oral BID  . nafcillin IV  2 g Intravenous 6 times per day  . neomycin-bacitracin-polymyxin   Topical Daily  . nicotine  7 mg Transdermal Daily  . sodium chloride  500 mL Intravenous Once   Continuous Infusions:    Time spent: 30 minutes   [facility]  [facility] Pager-  [email or web address], password [email or web address] 01/18/2015, 1:28 PM  LOS: 9 days

## 2015-01-18 NOTE — Progress Notes (Signed)
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 Problems:   Tobacco abuse   Noncompliance   Dehydration   Diarrhea   Diabetes type 2, uncontrolled   Hyperglycemia   Uncontrolled hypertension   Diabetic foot ulcers   Pain   Diabetic ulcer of left foot associated with type 2 diabetes mellitus   Diabetic ulcer of right foot associated with type 2 diabetes mellitus   Osteomyelitis   Shoulder pain   Thigh pain, musculoskeletal   Septic shock   Staphylococcus aureus bacteremia with sepsis   Smoker   Elevated troponin   Acute renal failure   Bacteremia   Septic joint of right shoulder region   Infective myositis of left thigh   Purulent pericarditis   Mitral regurgitation   Endocarditis   Back pain   Poor dentition   Discitis of thoracic region   Congestive heart failure    SUBJECTIVE  Had discomfort including chest pain and SOB all night long, worse with deep inhalation and body rotation. Feels better when sat up and worse when laying down.  CURRENT MEDS . bisacodyl  10 mg Rectal Once  . chlorhexidine  15 mL Mouth/Throat BID  . feeding supplement (GLUCERNA SHAKE)  237 mL Oral BID BM  . feeding supplement (PRO-STAT SUGAR FREE 64)  30 mL Oral Q1500  . hydrALAZINE  25 mg Oral 3 times per day  . Influenza vac split quadrivalent PF  0.5 mL Intramuscular Tomorrow-1000  . insulin aspart  0-15 Units Subcutaneous QAC lunch  . insulin aspart  0-5 Units Subcutaneous QHS  . insulin aspart protamine- aspart  28 Units Subcutaneous BID WC  . living well with diabetes book   Does not apply Once  . metoprolol tartrate  50 mg Oral BID  . nafcillin IV  2 g Intravenous 6 times per day  . neomycin-bacitracin-polymyxin   Topical Daily  . nicotine  7 mg Transdermal Daily  . sodium chloride  500 mL Intravenous Once    OBJECTIVE  Filed Vitals:   01/17/15 1300 01/17/15 1532 01/17/15 2026 01/18/15 0431   BP: 159/96 140/83 158/87 156/92  Pulse: 93 91 99 96  Temp: 99.7 F (37.6 C) 98.8 F (37.1 C) 98.8 F (37.1 C) 98.4 F (36.9 C)  TempSrc:  Oral Oral Oral  Resp: 18 18 20 19   Height:      Weight:      SpO2: 97% 100% 97% 99%    Intake/Output Summary (Last 24 hours) at 01/18/15 0753 Last data filed at 01/18/15 [id number]  Gross per 24 hour  Intake    480 ml  Output    602 ml  Net   -122 ml   Filed Weights   01/09/15 0017 01/09/15 0329 01/17/15 1100  Weight: 220 lb (99.791 kg) 226 lb 6.4 oz (102.694 kg) 242 lb 9.6 oz (110.043 kg)    PHYSICAL EXAM  General: Pleasant, NAD. Eating breakfast Neuro: Alert and oriented X 3. Moves all extremities spontaneously. Psych: Normal affect. HEENT:  Normal  Neck: Supple without bruits or JVD. Lungs:  Resp regular and unlabored, CTA with decreased breath sound in bilateral bases Heart: mildly tachycardic. no s3, s4, or murmurs. Abdomen: Soft, non-tender, non-distended, BS + x 4.  Extremities: No clubbing, cyanosis or edema. DP/PT/Radials 2+ and equal bilaterally.  Accessory Clinical Findings  CBC  Recent Labs  01/17/15 0535 01/18/15 0357  WBC 13.7* 20.2*  HGB 9.5*  9.8*  HCT 28.7* 29.6*  MCV 86.2 86.0  PLT 387 506*   Basic Metabolic Panel  Recent Labs  01/17/15 0535 01/18/15 0357  NA 132* 135  K 3.6 3.3*  CL 105 104  CO2 23 21  GLUCOSE 228* 67*  BUN 15 14  CREATININE 1.86* 2.13*  CALCIUM 7.6* 8.1*   Liver Function Tests  Recent Labs  01/17/15 0535 01/18/15 0357  AST 29 22  ALT 17 16  ALKPHOS 209* 202*  BILITOT 1.2 1.6*  PROT 6.0 6.5  ALBUMIN 1.2* 1.3*    TELE NSR with HR 90s    ECG  No new EKG  Echocardiogram 01/13/2015  LV EF: 60% -  65%  ------------------------------------------------------------------- Study Conclusions  - Left ventricle: The cavity size was normal. There was moderate concentric hypertrophy. Systolic function was normal. The estimated ejection fraction was in the range of  60% to 65%. Wall motion was normal; there were no regional wall motion abnormalities. - Aortic valve: No evidence of vegetation. 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 There is an associated 4x6 mm vegetation. There is severe mitral insufficiency through the perforation in the posterior leaflet with reversal of flow in both the left and right pulmonary veins. - Left atrium: The atrium was mildly dilated. No evidence of thrombus in the atrial cavity or appendage. No spontaneous echo contrast was observed. - Right atrium: No evidence of thrombus in the atrial cavity or appendage. No evidence of thrombus in the atrial cavity or appendage. - Atrial septum: No defect or patent foramen ovale was identified. - Tricuspid valve: No evidence of vegetation. - Pulmonic valve: No evidence of vegetation. - Pericardium, extracardiac: A small pericardial effusion was identified circumferential to the heart. The fluid exhibited a fibrinous appearance.There was no evidence of hemodynamic compromise.    Radiology/Studies  Dg Orthopantogram  01/14/2015   CLINICAL DATA:  Staphylococcal endocarditis.  Poor dentition.  EXAM: ORTHOPANTOGRAM/PANORAMIC  COMPARISON:  None.  FINDINGS: Extensive dental disease with caries involving multiple teeth, including numbers 7, 8, 9, 10, 11, 12, 13, 14, 15, 16, 19, 20, 29, 30, 31 and 32. Periapical lucencies are present adjacent to teeth numbers 29 and 30.  IMPRESSION: Extensive dental disease as described.   Electronically Signed   By: Hulan Saas M.D.   On: 01/14/2015 21:31   Dg Chest 2 View  01/14/2015   CLINICAL DATA:  Right shoulder pain for 6 days.  EXAM: CHEST  2 VIEW  COMPARISON:  01/10/2015  FINDINGS: Two views of the chest demonstrate low lung volumes with bibasilar chest densities, left side greater than right. In addition, the heart appears  to be enlarged. The configuration of the heart raises the possibility for pericardial fluid. No evidence for pulmonary edema. Difficult to exclude small effusions, particularly on the left side.  IMPRESSION: Low lung volumes with bibasilar densities, left side greater than right. Basilar densities could represent a combination of atelectasis and pleural fluid.  Heart appears enlarged which may be accentuated by low lung volumes. Pericardial fluid cannot be excluded.   Electronically Signed   By: Richarda Overlie M.D.   On: 01/14/2015 08:17   Dg Shoulder Right  01/09/2015   CLINICAL DATA:  Anterior right shoulder pain  EXAM: RIGHT SHOULDER - 2+ VIEW  COMPARISON:  None.  FINDINGS: No fracture or dislocation is seen.  The joint spaces are preserved.  The visualized soft tissues  are unremarkable.  Visualized right lung is clear.  IMPRESSION: No fracture or dislocation is seen.   Electronically Signed   By: Charline Bills M.D.   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  EXAM: MRI HEAD WITHOUT CONTRAST  TECHNIQUE: Multiplanar, multiecho pulse sequences of the brain and surrounding structures were obtained without intravenous contrast.  COMPARISON:  Prior study from 04/25/2012  FINDINGS: Cerebral volume within normal limits for patient age. No significant chronic small vessel ischemic changes identified. No mass lesion, midline shift, or mass effect. Ventricles are normal in size without evidence of hydrocephalus. No extra-axial fluid collection.  There are multiple subcentimeter ischemic infarcts involving predominantly the supratentorial brain. These involve both frontal lobes, the right parietal lobe, left temporal lobe, ischemic infarct within the right is cerebral hemisphere is located in the anterior right frontal lobe and measures 5 mm (series 3, image 36). The largest infarct in the left cerebral  hemisphere is located in the deep white matter of the anterior left frontal lobe and measures 13 mm (series 3, image 30). There are infarcts involving the bilateral basal ganglia, the most prominent of which located in the right thalamus which measures 4 mm. There is a single infratentorial infarct within the left cerebellar hemisphere measuring 4 mm. These are most likely embolic in nature given the history of endocarditis and multiple vascular distributions. Scattered susceptibility artifact seen associated with a few of these infarcts, likely reflecting small petechial hemorrhages. No frank hemorrhagic conversion There is associated T2/FLAIR signal intensity without significant mass effect.  Craniocervical junction within normal limits. Pituitary gland normal.  No acute abnormality seen about the orbits.  Paranasal sinuses are clear.  No mastoid effusion.  Bone marrow signal intensity within normal limits. Visualized upper cervical spine unremarkable.  Scalp soft tissues within normal limits.  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 These are likely embolic in nature given the history of endocarditis and involvement of multiple vascular distributions. No significant mass effect. There are likely scattered small petechial hemorrhages with a number these infarcts without frank hemorrhage.   Electronically Signed   By: Rise Mu M.D.   On: 01/14/2015 21:24   Mr Thoracic Spine Wo Contrast  01/15/2015   ADDENDUM REPORT: 01/15/2015 14:40  ADDENDUM: Study discussed by telephone with Dr. Paulette Blanch DAM on 01/15/2015 at 1435 hrs.   Electronically Signed   By: Augusto [name] M.D.   On: 01/15/2015 14:40   01/15/2015   CLINICAL DATA:  35 year old male with sepsis and back pain. Into card itis, purulent pericarditis, multiple small embolic infarcts. Initial encounter.  EXAM: MRI THORACIC AND LUMBAR SPINE WITHOUT CONTRAST  TECHNIQUE: Multiplanar  and multiecho pulse sequences of the thoracic and lumbar spine were obtained without intravenous contrast.  COMPARISON:  Chest radiographs 01/14/2015. CT Abdomen and Pelvis 05/18/2013.  FINDINGS: MR THORACIC SPINE FINDINGS  Limited sagittal imaging of the cervical spine is unremarkable.  Preserved thoracic vertebral height and alignment, preserved thoracic vertebral height and alignment.  Throughout much of the T3 and T4 vertebral bodies there is heterogeneous T2 signal as well as patchy pre STIR signal. The STIR images were repeated, an the same patchy increased signal persisted at T3 and T4.  No definite signal abnormality in the intervening disc, or adjacent disc spaces; there is widespread intervertebral disc T2 hyperintensity, as a normal finding seen in  young patients.  No definite paraspinal or epidural inflammation at those levels.  No suspicious marrow signal elsewhere; intrinsic T1 and T2 hyperintensity at T6 is a benign vertebral body hemangioma.  However, there are layering pleural effusions left greater than right. Negative visualized upper abdominal viscera.  Epidural lipomatosis throughout the thoracic spine and effaces CSF from the thecal sac from the T5 to the T9 level. No definite spinal cord signal abnormality.  MR LUMBAR SPINE FINDINGS  In the lumbar spine T1-T2 and stir marrow signal appears diminished. This is also true in the visible pelvis. Possible associated decreased T2 signal in both the liver and spleen.  Otherwise negative visualized abdominal viscera.  There is mildly increased STIR signal in the lower lumbar erector spinae muscles bilaterally, at L4-L5 (series 17, images 9 and 1). Subcutaneous edema incidentally noted.  Normal lumbar vertebral height and alignment and no marrow edema or acute osseous abnormality in the lumbar spine.  No definite signal abnormality in the lower thoracic spinal cord, conus at L1-L2.  Lumbar facet hypertrophy.  No lumbar spinal stenosis.  IMPRESSION: MR  THORACIC SPINE IMPRESSION  1. Abnormal marrow edema in the T3 and T4 vertebral bodies highly suspicious for acute osteomyelitis in this setting. No associated epidural or paraspinal inflammation identified. 2. No other acute findings in the thoracic spine. Epidural lipomatosis. 3. Left greater than right layering pleural effusions.  MR LUMBAR SPINE IMPRESSION  1. Mild increased signal in the L4-L5 level erector spinae muscles bilaterally, which could be infectious myositis but also this appearance can be seen without infection in chronically debilitated patients. 2. No other acute finding in the lumbar spine. 3. Decreased bone marrow signal suggestive of red marrow conversion versus hemosiderosis.  Electronically Signed: By: Augusto [name] M.D. On: 01/15/2015 14:20   Mr Lumbar Spine Wo Contrast  01/15/2015   ADDENDUM REPORT: 01/15/2015 14:40  ADDENDUM: Study discussed by telephone with Dr. Paulette Blanch DAM on 01/15/2015 at 1435 hrs.   Electronically Signed   By: Augusto [name] M.D.   On: 01/15/2015 14:40   01/15/2015   CLINICAL DATA:  35 year old male with sepsis and back pain. Into card itis, purulent pericarditis, multiple small embolic infarcts. Initial encounter.  EXAM: MRI THORACIC AND LUMBAR SPINE WITHOUT CONTRAST  TECHNIQUE: Multiplanar and multiecho pulse sequences of the thoracic and lumbar spine were obtained without intravenous contrast.  COMPARISON:  Chest radiographs 01/14/2015. CT Abdomen and Pelvis 05/18/2013.  FINDINGS: MR THORACIC SPINE FINDINGS  Limited sagittal imaging of the cervical spine is unremarkable.  Preserved thoracic vertebral height and alignment, preserved thoracic vertebral height and alignment.  Throughout much of the T3 and T4 vertebral bodies there is heterogeneous T2 signal as well as patchy pre STIR signal. The STIR images were repeated, an the same patchy increased signal persisted at T3 and T4.  No definite signal abnormality in the intervening disc, or adjacent disc spaces; there is  widespread intervertebral disc T2 hyperintensity, as a normal finding seen in young patients.  No definite paraspinal or epidural inflammation at those levels.  No suspicious marrow signal elsewhere; intrinsic T1 and T2 hyperintensity at T6 is a benign vertebral body hemangioma.  However, there are layering pleural effusions left greater than right. Negative visualized upper abdominal viscera.  Epidural lipomatosis throughout the thoracic spine and effaces CSF from the thecal sac from the T5 to the T9 level. No definite spinal cord signal abnormality.  MR LUMBAR SPINE FINDINGS  In the lumbar spine T1-T2 and stir marrow signal  appears diminished. This is also true in the visible pelvis. Possible associated decreased T2 signal in both the liver and spleen.  Otherwise negative visualized abdominal viscera.  There is mildly increased STIR signal in the lower lumbar erector spinae muscles bilaterally, at L4-L5 (series 17, images 9 and 1). Subcutaneous edema incidentally noted.  Normal lumbar vertebral height and alignment and no marrow edema or acute osseous abnormality in the lumbar spine.  No definite signal abnormality in the lower thoracic spinal cord, conus at L1-L2.  Lumbar facet hypertrophy.  No lumbar spinal stenosis.  IMPRESSION: MR THORACIC SPINE IMPRESSION  1. Abnormal marrow edema in the T3 and T4 vertebral bodies highly suspicious for acute osteomyelitis in this setting. No associated epidural or paraspinal inflammation identified. 2. No other acute findings in the thoracic spine. Epidural lipomatosis. 3. Left greater than right layering pleural effusions.  MR LUMBAR SPINE IMPRESSION  1. Mild increased signal in the L4-L5 level erector spinae muscles bilaterally, which could be infectious myositis but also this appearance can be seen without infection in chronically debilitated patients. 2. No other acute finding in the lumbar spine. 3. Decreased bone marrow signal suggestive of red marrow conversion versus  hemosiderosis.  Electronically Signed: By: Augusto [name] M.D. On: 01/15/2015 14:20   US Renal  01/12/2015   CLINICAL DATA:  Acute renal failure  EXAM: RENAL/URINARY TRACT ULTRASOUND COMPLETE  COMPARISON:  None.  FINDINGS: Right Kidney:  Length: 12.8 cm in length. Diffuse increased cortical echogenicity suspicious for medical renal disease. No hydronephrosis. No renal calculus.  Left Kidney:  Length: . 12.6 cm in length. Diffuse increased echogenicity suspicious for medical renal disease. No hydronephrosis. No diagnostic renal calculus.  Bladder:  Appears normal for degree of bladder distention.  IMPRESSION: Bilateral kidneys shows diffuse increased echogenicity. Medical renal disease cannot be excluded. No hydronephrosis or diagnostic renal calculus.   Electronically Signed   By: Natasha Mead M.D.   On: 01/12/2015 16:08   Mr Shoulder Right Wo Contrast  01/11/2015   CLINICAL DATA:  Right shoulder pain.  EXAM: MRI OF THE RIGHT SHOULDER WITHOUT CONTRAST  TECHNIQUE: Multiplanar, multisequence MR imaging of the shoulder was performed. No intravenous contrast was administered.  COMPARISON:  Radiographs dated 01/09/2015  FINDINGS: Rotator cuff:  Normal.  Muscles:  Normal.  Biceps long head: Properly located and intact. There is inflammation in the soft tissues around the bicipital tendon sheath with fluid in the bicipital tendon sheath.  Acromioclavicular Joint: Small degenerative cyst in the acromion. Otherwise normal AC joint. Type 1 acromion. No bursitis.  Glenohumeral Joint: There is a moderate glenohumeral joint effusion with extravasation of contrast from the subcoracoid recess of the joint into the adjacent soft tissues. Fluid distends the bicipital tendon sheath.  Labrum:  Intact.  Bones: No significant abnormalities. Tiny degenerative cystic areas in the posterior aspect of the greater tuberosity of the proximal humerus.  IMPRESSION: 1. Moderate glenohumeral joint effusion with some extravasation of joint. Fluid  distends the bicipital tendon sheath with inflammation/extravasated joint fluid around the bicipital tendon sheath. The possibly of synovitis should be considered. 2. Normal rotator cuff.   Electronically Signed   By: Geanie Cooley M.D.   On: 01/11/2015 11:21   Mr Tibia Fibula Right Wo Contrast  01/15/2015   CLINICAL DATA:  Diabetic patient with bacteremia and right leg pain.  EXAM: MRI OF LOWER RIGHT EXTREMITY WITHOUT CONTRAST  TECHNIQUE: Multiplanar, multisequence MR imaging of the right lower leg was performed. No intravenous contrast was administered.  COMPARISON:  None.  FINDINGS: The axial and coronal sequences incidentally include the left lower leg. There is marked subcutaneous edema of both lower legs which appears slightly worse on the left. No focal subcutaneous fluid collection is identified. Mildly increased T2 signal is seen in the gastrocnemius, soleus and and tibialis posterior muscles bilaterally which appears symmetric. No intramuscular fluid collection is identified. No bone marrow signal abnormality to suggest osteomyelitis is seen. There is no muscle or tendon tear.  IMPRESSION: Intense bilateral lower leg subcutaneous edema appears worse on the left and could be due to dependent change or cellulitis.  Mildly increased T2 signal in the gastrocnemius, soleus and tibialis posterior muscles bilaterally could be due to myositis or early changes of denervation atrophy in this diabetic patient.  Negative for abscess or osteomyelitis.   Electronically Signed   By: Drusilla Kanner M.D.   On: 01/15/2015 08:28   Mr Foot Right Wo Contrast  01/11/2015   CLINICAL DATA:  Diabetic foot ulcers.  EXAM: MRI OF THE RIGHT FOREFOOT WITHOUT CONTRAST  TECHNIQUE: Multiplanar, multisequence MR imaging was performed. No intravenous contrast was administered.  COMPARISON:  Radiographs dated 01/09/2011  FINDINGS: There are small erosions of the proximal phalanx at the IP joint of the great toe and there are small  erosions at the first metatarsal phalangeal joint, both consistent with gout. There are no soft tissue abscesses. There is no osteomyelitis. There is nonspecific edema in the subcutaneous fat of the dorsum of the foot. Muscles and tendons and ligaments appear appear normal. No joint effusions.  IMPRESSION: Subcutaneous edema primarily on the dorsum of the foot. 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   Electronically Signed   By: Geanie Cooley M.D.   On: 01/11/2015 12:50   Mr Femur Left Wo Contrast  01/12/2015   CLINICAL DATA:  Diabetes. Peripheral neuropathy. Sepsis. Bilateral thigh pain. RIGHT thigh pain. Tenderness in the medial aspect of the thigh.  EXAM: MR OF THE LEFT FEMUR WITHOUT CONTRAST  TECHNIQUE: Multiplanar, multisequence MR imaging was performed. No intravenous contrast was administered.  COMPARISON:  None.  FINDINGS: Diffuse subcutaneous edema is present around the pelvis and in both thighs, most compatible with anasarca. This could also represent cellulitis in the appropriate clinical setting. Subcutaneous fluid is present in the lateral LEFT thigh, without a discrete abscess. There is myositis of the LEFT vastus lateralis with mild muscular edema av but no significant swelling. No fatty atrophy.  Bone marrow signal shows heterogenous marrow. This is a nonspecific finding most commonly associated with obesity, anemia, cigarette smoking or chronic disease. Negative for hip fracture or AVN. There is no evidence of osteomyelitis. No deep soft tissue abscesses are present.  Bilaterally in the groin, there are prominent lymph nodes which are probably reactive to lower extremity process.  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  IMPRESSION: 1. Diffuse subcutaneous edema in the pelvis and thighs bilaterally, suggesting anasarca. 2. More prominent subcutaneous edema along the LEFT thigh with subcutaneous fluid superficial to the  muscular fascia, likely representing cellulitis. No abscess. 3. Myositis of the proximal vastus lateralis, probably infectious. Reactive edema could produce this appearance as well. 4. Reactive inguinal adenopathy bilaterally. 5. Negative for osteomyelitis.   Electronically Signed   By: Andreas Newport M.D.   On: 01/12/2015 14:33   Mr Foot Left Wo Contrast  01/11/2015   CLINICAL DATA:  Diabetic foot ulcers.  Foot pain.  Bony erosions.  EXAM: MRI OF THE LEFT FOREFOOT WITHOUT CONTRAST  TECHNIQUE: Multiplanar, multisequence MR imaging was performed. No intravenous contrast was administered.  COMPARISON:  Radiograph dated 01/09/2015  FINDINGS: There is slight edema throughout the medial cuneiform. There is artifactual increased signal from the metatarsal heads in from the proximal phalanx of the great toe. There are erosions at IP joint and first metatarsal phalangeal joint consistent with gout.  There are no soft tissue abscesses and there is no osteomyelitis. There is prominent edema in the subcutaneous fat of the dorsum of the foot.  IMPRESSION: No evidence of osteomyelitis or abscess. The edema in the medial cuneiform is most likely a stress reaction. 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   Electronically Signed   By: Geanie Cooley M.D.   On: 01/11/2015 12:15   Dg Chest Port 1 View  01/10/2015   CLINICAL DATA:  Fever and sweats.  EXAM: PORTABLE CHEST - 1 VIEW  COMPARISON:  None.  FINDINGS: Low lung volumes with vascular crowding and streaky subsegmental atelectasis. No obvious infiltrate or effusion. The heart is within normal limits in size given the AP portable technique. The bony structures are grossly normal.  IMPRESSION: Low lung volumes with vascular crowding and bibasilar atelectasis.   Electronically Signed   By: Loralie Champagne M.D.   On: 01/10/2015 11:27   Dg Foot Complete Left  01/09/2015   CLINICAL DATA:  35 year old male with recurrent bilateral foot pain.   EXAM: LEFT FOOT - COMPLETE 3+ VIEW  COMPARISON:  01/05/2014.  FINDINGS: Large periarticular erosions are noted adjacent to the first MTP joint, and at the first interphalangeal joint along the medial margin in both locations. These have erosions have sclerotic margins and overhanging edges, and there is adjacent soft tissue prominence, suggestive of gouty erosions. No adjacent soft tissue calcification is noted. No acute displaced fracture, subluxation or dislocation. Small heterotopic ossification adjacent to the plantar aspect of the calcaneus, likely ossification within the plantar fascia.  IMPRESSION: 1. Findings, as above, suggestive of underlying gout. Clinical correlation is recommended.   Electronically Signed   By: Trudie Reed M.D.   On: 01/09/2015 09:46   Dg Foot Complete Right  01/09/2015   CLINICAL DATA:  Recurring bilateral foot pain  EXAM: RIGHT FOOT COMPLETE - 3+ VIEW  COMPARISON:  None.  FINDINGS: No fracture or dislocation is seen.  Marginal erosion at the medial aspect of the 1st IP joint, involving the proximal phalanx.  The visualized soft tissues are unremarkable.  IMPRESSION: Marginal erosion at the medial aspect of the 1st IP joint. Correlate for inflammatory arthropathy such as gout.  Otherwise, no acute osseus abnormality is seen.   Electronically Signed   By: Charline Bills M.D.   On: 01/09/2015 09:44   Dg Fluoro Guide Ndl Plc/bx  01/14/2015   CLINICAL DATA:  Right shoulder pain and effusion.  Bacteremia.  EXAM: RIGHT SHOULDER ASPIRATION UNDER FLUOROSCOPY  FLUOROSCOPY TIME:  0 min 24 seconds  PROCEDURE: Overlying skin prepped with Betadine, draped in the usual sterile fashion, and infiltrated locally with buffered Lidocaine. Twenty gauge spinal needle was placed in the right glenohumeral joint under direct fluoroscopic visualization.  2 cc of bloody cloudy fluid was aspirated from the joint. Mr. Langan tolerated the procedure well.  IMPRESSION: Technically successful right  shoulder aspiration under fluoroscopy. Fluid sent for laboratory analysis.   Electronically Signed   By: Geanie Cooley M.D.   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    1. Elevated trop: in the setting of endocarditis, myocardial inflammation.  2. 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 - seen by Dr. Barry Dienes with CT surgery, discussed with Dr. Barry Dienes, would prefer the bacteremia to clear up first, however we are concerned about the degree his MR worsened in 2 days between TTE and TEE. May need earlier surgery his start to have HF symptom. Has shown to have septic emboli to the brain based on MRI of brain. ?if has emboli to kidney as well, however prefer not to have CTA of abdomen given recent decline in renal function  - Pericarditis, small effusion. Discussed. - per Dr. Cornelius Moras, ideally need cardiac cath, LE angiography to r/o septic emboli to LLE arterial system, however understandably his renal function is prohibitive of such test now. He will need dental eval prior to surgery. Vascular consulted, no further intervention at this time. LE venous duplex negative for DVT  - if shoulder surgery is necessary, moderate cardiac risk (based on severe MR and underlying endocarditis) for shoulder debridement, and may proceed if benefit felt to outweigh risk . EF normal. At this time, ortho team recommends med mgt. S/p aspiration.  - patient began to have significant orthopnea concerning for fluid overload, given 40mg  IV lasix yesterday, unfortunately renal function worsened overnight, even though did  not have significant urine output (?accuracy). Symptom for orthopnea is slightly atypical, with increasing pain with body rotation and deep inhalation as well. Will hold off on further diuresis for now until discuss with MD    3. staph bacteremia, MSSA- ID, Nafcillin. Now MRI of spine concerning for osteo.  4. PVD, LE ABI 0.64. Vascular evaluated. No DVT, Monitor.  5. Acute on chronic renal insufficiency: improving on IV fluids, now +9L positive, would d/c IV fluids - he is orthopneic today. 6. Hypoalbuminemia 7. Hyponatremia - Suspect this is related to CHF at this point, given his +++ volume status. D/c IV fluids, give lasix today. Follow creatinine. Check BNP.  Signed, Azalee Course PA-C Pager: [phone]

## 2015-01-18 NOTE — Clinical Documentation Improvement (Signed)
MD's,   Per op note "debridement" of bilateral diabetic foot ulcers if appropriate please document  type of procedure done.   Thank you   Please clarify documentation in the medical record of "debridement."  Please document the below (4) key elements in a progress note:  ? Excisional   ? Nonexcisional  Treatment: surgical debridement with irrigation    Thank You,  Lavonda JumboLawanda J Lebron Nauert ,RN Clinical Documentation Specialist:  [phone]  [facility]- Health Information Management

## 2015-01-19 DIAGNOSIS — K0530 Chronic periodontitis, unspecified: Secondary | ICD-10-CM

## 2015-01-19 DIAGNOSIS — I5021 Acute systolic (congestive) heart failure: Secondary | ICD-10-CM

## 2015-01-19 DIAGNOSIS — K045 Chronic apical periodontitis: Secondary | ICD-10-CM

## 2015-01-19 DIAGNOSIS — I330 Acute and subacute infective endocarditis: Secondary | ICD-10-CM

## 2015-01-19 LAB — CBC
HEMATOCRIT: 27 % — AB (ref 39.0–52.0)
Hemoglobin: 8.9 g/dL — ABNORMAL LOW (ref 13.0–17.0)
MCH: 28.4 pg (ref 26.0–34.0)
MCHC: 33 g/dL (ref 30.0–36.0)
MCV: 86.3 fL (ref 78.0–100.0)
Platelets: 434 10*3/uL — ABNORMAL HIGH (ref 150–400)
RBC: 3.13 MIL/uL — ABNORMAL LOW (ref 4.22–5.81)
RDW: 14.4 % (ref 11.5–15.5)
WBC: 13.7 10*3/uL — ABNORMAL HIGH (ref 4.0–10.5)

## 2015-01-19 LAB — COMPREHENSIVE METABOLIC PANEL
ALBUMIN: 1.2 g/dL — AB (ref 3.5–5.2)
ALT: 13 U/L (ref 0–53)
AST: 17 U/L (ref 0–37)
Alkaline Phosphatase: 185 U/L — ABNORMAL HIGH (ref 39–117)
Anion gap: 4 — ABNORMAL LOW (ref 5–15)
BUN: 18 mg/dL (ref 6–23)
CALCIUM: 7.9 mg/dL — AB (ref 8.4–10.5)
CHLORIDE: 104 meq/L (ref 96–112)
CO2: 29 mmol/L (ref 19–32)
CREATININE: 2.2 mg/dL — AB (ref 0.50–1.35)
GFR calc Af Amer: 43 mL/min — ABNORMAL LOW (ref >90)
GFR calc non Af Amer: 37 mL/min — ABNORMAL LOW (ref >90)
Glucose, Bld: 116 mg/dL — ABNORMAL HIGH (ref 70–99)
Potassium: 3.4 mmol/L — ABNORMAL LOW (ref 3.5–5.1)
Sodium: 137 mmol/L (ref 135–145)
Total Bilirubin: 0.9 mg/dL (ref 0.3–1.2)
Total Protein: 5.8 g/dL — ABNORMAL LOW (ref 6.0–8.3)

## 2015-01-19 LAB — GLUCOSE, CAPILLARY
GLUCOSE-CAPILLARY: 94 mg/dL (ref 70–99)
GLUCOSE-CAPILLARY: 185 mg/dL — AB (ref 70–99)
GLUCOSE-CAPILLARY: 211 mg/dL — AB (ref 70–99)
Glucose-Capillary: 227 mg/dL — ABNORMAL HIGH (ref 70–99)

## 2015-01-19 LAB — CK: CK TOTAL: 152 U/L (ref 7–232)

## 2015-01-19 MED ORDER — OXYCODONE HCL 5 MG PO TABS
5.0000 mg | ORAL_TABLET | ORAL | Status: DC | PRN
  Administered 2015-01-19 – 2015-01-24 (×14): 10 mg via ORAL
  Administered 2015-01-25 (×2): 5 mg via ORAL
  Administered 2015-01-25 – 2015-01-28 (×4): 10 mg via ORAL
  Filled 2015-01-19: qty 2
  Filled 2015-01-19: qty 1
  Filled 2015-01-19 (×9): qty 2
  Filled 2015-01-19: qty 1
  Filled 2015-01-19 (×7): qty 2

## 2015-01-19 MED ORDER — POTASSIUM CHLORIDE CRYS ER 20 MEQ PO TBCR
40.0000 meq | EXTENDED_RELEASE_TABLET | Freq: Once | ORAL | Status: AC
  Administered 2015-01-19: 40 meq via ORAL
  Filled 2015-01-19: qty 2

## 2015-01-19 MED ORDER — SODIUM CHLORIDE 0.9 % IV SOLN
2.0000 g | INTRAVENOUS | Status: DC
  Administered 2015-01-19 – 2015-01-28 (×52): 2 g via INTRAVENOUS
  Filled 2015-01-19 (×59): qty 2000

## 2015-01-19 MED ORDER — PRO-STAT SUGAR FREE PO LIQD
30.0000 mL | Freq: Three times a day (TID) | ORAL | Status: DC
Start: 2015-01-20 — End: 2015-01-25
  Administered 2015-01-20 – 2015-01-24 (×4): 30 mL via ORAL
  Filled 2015-01-19 (×19): qty 30

## 2015-01-19 NOTE — Progress Notes (Signed)
[facility] PROGRESS NOTE  Corrion Stirewalt WUJ:[id number] DOB: [birth year] DOA: 01/09/2015 PCP: No PCP Per Patient    Brief narrative: Dwayne [name] is a 35 y.o. male with a history of Uncontrolled HTN, DM2 who presents to the ED with complaints of diarrhea and fevers and chills x 24 hours. He reports having weakness and fatigue and He denies any nausea or vomiting. No one is sick at home, and he denies taking any recent antibiotic therapy. He reports that he has been out of his medications for months, and has not seen his PCP in months as well. he was found to have MSSA bacteremia and came in septic shock. He underwent TEE showing MV endocarditis. CTVs consulted and plan for surgery in the next week. Dental medicine consulted and plan for extraction int he next few as their schedule permits.   HPI/Subjective: Ambulating in the room. Left thigh hurt but this is not new. No new symptoms.   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 Algis Liming recommends 8 weeks of antibiotics post heart surgery and all other potential surgeries - seen by Dr. Barry Dienes with CT surgery-- He would prefer the bacteremia to clear up first, however, he is concerned about the degree his MR worsened in 2 days between TTE and TEE. May need earlier surgery his start to have HF symptom. - per Dr. Cornelius Moras, ideally need cardiac cath- Cr clearance prohibitive of this at this time - cardiology also following and assisting with management- given a dose of Lasix today for fluid overload - WBC up today - management per ID  Dental caries - dental medicine consulted for extraction prior to valve replacement  HTN - BP was better controlled - increased Lopressor to 50 bid and added oral hydralazine- cont to follow at current  doses  Poorly controlled diabetes - appears to have a poor understand about the need to control oral glucose/carb intake- I have spoken with him the dietary restrictions he needs to follow Hemoglobin A1c 8.1 Patient admits to being noncompliant with medications Increased 70/30 to achieve better control- now sugars improved As he is on 70/30, he should technically need a sliding scale at lunch only rather than TID - have modified this   Acute renal failure Creatinine slightly  worse today- may be related to sepsis and diuresis-. Continue to monitor.   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 difficile negative   Code Status: full Family Communication: family at bedside Disposition Plan:  As above   Consultants:  Cardiothoracic surgery  Dental medicine  Infectious disease  Procedures:  None  Antibiotics: Anti-infectives    Start     Dose/Rate Route Frequency Ordered Stop   01/19/15 2000  ampicillin (OMNIPEN) 2 g in sodium chloride 0.9 % 50 mL IVPB     2 g150 mL/hr over 20 Minutes Intravenous 6 times per day 01/19/15 1121     01/18/15 2000  penicillin G potassium 12 Million Units in dextrose 5 % 500 mL continuous infusion     12 Million Units41.7 mL/hr over 12 Hours Intravenous Every 12 hours 01/18/15 1432 01/19/15 2050   01/15/15 1600  nafcillin 2 g in dextrose 5 % 50 mL IVPB  Status:  Discontinued     2 g100 mL/hr over 30 Minutes Intravenous 6 times per day 01/15/15 1503 01/15/15 1517   01/15/15 1600  nafcillin 2 g in dextrose 5 % 50 mL IVPB     2 g100  mL/hr over 30 Minutes Intravenous 6 times per day 01/15/15 1517 01/18/15 1753   01/15/15 0930  nafcillin injection 2 g  Status:  Discontinued     2 g Intravenous Every 4 hours 01/15/15 0921 01/15/15 1503   01/14/15 1500  DAPTOmycin (CUBICIN) 600 mg in sodium chloride 0.9 % IVPB  Status:  Discontinued     600 mg224 mL/hr over 30 Minutes Intravenous Every 48 hours 01/12/15 1421 01/13/15 1239   01/13/15 1500  ceFAZolin (ANCEF) IVPB 2 g/50 mL premix  Status:  Discontinued     2 g100 mL/hr over 30 Minutes Intravenous 3 times per day 01/13/15 1407 01/15/15 0921   01/12/15 1600  ceFAZolin (ANCEF) IVPB 2 g/50 mL premix  Status:  Discontinued     2 g100 mL/hr over 30 Minutes Intravenous Every 12 hours 01/12/15 1419 01/13/15 1407   01/11/15 1200  DAPTOmycin (CUBICIN) 600 mg in sodium chloride 0.9 % IVPB  Status:  Discontinued     600 mg224 mL/hr over 30 Minutes Intravenous Every 24 hours 01/11/15 1003 01/12/15 1421   01/10/15 1200  vancomycin (VANCOCIN) IVPB 1000 mg/200 mL premix  Status:  Discontinued     1,000 mg200 mL/hr over 60 Minutes Intravenous Every 8 hours 01/10/15 1044 01/11/15 0947   01/10/15 1100  piperacillin-tazobactam (ZOSYN) IVPB 3.375 g  Status:  Discontinued     3.375 g12.5 mL/hr over 240 Minutes Intravenous Every 8 hours 01/10/15 1043 01/11/15 0902   01/09/15 0700  metroNIDAZOLE (FLAGYL) IVPB 500 mg  Status:  Discontinued     500 mg100 mL/hr over 60 Minutes Intravenous Every 8 hours 01/09/15 0651 01/10/15 1018       Objective: Filed Vitals:   01/18/15 1407 01/18/15 2013 01/19/15 0418 01/19/15 1409  BP: 144/83 126/60 127/59 131/80  Pulse: 90 101 88 77  Temp: 98.6 F (37 C) 99.1 F (37.3 C) 99.2 F (37.3 C) 98.9 F (37.2 C)  TempSrc: Oral Oral Oral Oral  Resp: Height:      Weight:  90.538 kg (199 lb 9.6 oz) 106.641 kg (235 lb 1.6 oz)   SpO2: 99% 98% 97% 100%    Intake/Output Summary (Last 24 hours) at 01/19/15 1629 Last data filed at 01/19/15 1407  Gross  per 24 hour  Intake    960 ml  Output   2900 ml  Net  -1940 ml    Exam:  General: alert & oriented x 3 In NAD  Cardiovascular: RRR, nl S1 s2 + murmur 2+ at right sternal border. Respiratory: Decreased breath sounds at the bases, scattered rhonchi, no crackles  Abdomen: soft +BS NT/ND, no masses palpable  Extremities: No cyanosis and no edema      Data Reviewed: Basic Metabolic Panel:  Recent Labs Lab 01/13/15 0503  01/15/15 1540 01/16/15 0509 01/17/15 0535 01/18/15 0357 01/19/15 0321  NA 134*  < > 135 135 132* 135 137  K 3.1*  < >  3.3* 3.6 3.6 3.3* 3.4*  CL 101  < > 104 104 105 104 104  CO2 22  < > 22 23 23 21 29   GLUCOSE 123*  < > 206* 294* 228* 67* 116*  BUN 37*  < > 27* 23 15 14 18   CREATININE 3.05*  < > 2.19* 1.99* 1.86* 2.13* 2.20*  CALCIUM 8.0*  < > 7.9* 7.5* 7.6* 8.1* 7.9*  MG 2.3  --   --   --   --   --   --   < > = values in this interval not displayed.  Liver Function Tests:  Recent Labs Lab 01/15/15 1540 01/16/15 0509 01/17/15 0535 01/18/15 0357 01/19/15 0321  AST 40* 42* 29 22 17   ALT 14 19 17 16 13   ALKPHOS 263* 233* 209* 202* 185*  BILITOT 0.3 0.9 1.2 1.6* 0.9  PROT 6.0 5.5* 6.0 6.5 5.8*  ALBUMIN 1.3* 1.2* 1.2* 1.3* 1.2*   No results for input(s): LIPASE, AMYLASE in the last 168 hours. No results for input(s): AMMONIA in the last 168 hours.  CBC:  Recent Labs Lab 01/15/15 1540 01/16/15 0509 01/17/15 0535 01/18/15 0357 01/19/15 0321  WBC 12.4* 12.2* 13.7* 20.2* 13.7*  HGB 9.8* 9.1* 9.5* 9.8* 8.9*  HCT 29.3* 27.0* 28.7* 29.6* 27.0*  MCV 83.5 83.6 86.2 86.0 86.3  PLT 411* 372 387 506* 434*    Cardiac Enzymes:  Recent Labs Lab 01/12/15 1849 01/19/15 0321  CKTOTAL  --  152  TROPONINI 0.47*  --    BNP (last 3 results) No results for input(s): PROBNP in the last 8760 hours.   CBG:  Recent Labs Lab 01/18/15 1120 01/18/15 1619 01/18/15 2144 01/19/15 0609 01/19/15 1054  GLUCAP 138* 111* 113* 94 227*    Imaging  results reviewed in detail.    Scheduled Meds: . ampicillin (OMNIPEN) IV  2 g Intravenous 6 times per day  . bisacodyl  10 mg Rectal Once  . chlorhexidine  15 mL Mouth/Throat BID  . [START ON 01/20/2015] feeding supplement (PRO-STAT SUGAR FREE 64)  30 mL Oral TID WC  . furosemide  40 mg Intravenous BID  . hydrALAZINE  25 mg Oral 3 times per day  . Influenza vac split quadrivalent PF  0.5 mL Intramuscular Tomorrow-1000  . insulin aspart  0-15 Units Subcutaneous QAC lunch  . insulin aspart  0-5 Units Subcutaneous QHS  . insulin aspart protamine- aspart  28 Units Subcutaneous BID WC  . living well with diabetes book   Does not apply Once  . metoprolol tartrate  50 mg Oral BID  . neomycin-bacitracin-polymyxin   Topical Daily  . nicotine  7 mg Transdermal Daily  . penicillin g continuous IV infusion  12 Million Units Intravenous Q12H  . potassium chloride  40 mEq Oral Once  . sodium chloride  500 mL Intravenous Once   Continuous Infusions:    Time spent: 30 minutes   Lucca Ballo  [facility] [phone] Pager-  [email or web address], password [email or web address] 01/19/2015, 4:29 PM  LOS: 10 days

## 2015-01-19 NOTE — Progress Notes (Signed)
NUTRITION FOLLOW UP  INTERVENTION: -D/c Glucerna Shake po BID, each supplement provides 220 kcal and 10 grams of protein. -Increase 30 ml Prostat po to TID, each supplement provides 100 kcal and 15 grams of protein.  NUTRITION DIAGNOSIS: Increased nutrient needs related to wound healing as evidenced by estimated nutrition needs; ongoing  Goal: Pt to meet >/= 90% of their estimated nutrition needs; progressing  Monitor:  PO intake, weight trends, labs, I/O's  35 y.o. male  Admitting Dx: Dehydration  ASSESSMENT: Pt with a history of Uncontrolled HTN, DM2 who presents to the ED with complaints of diarrhea and fevers and chills x 24 hours.Pt with Diabetic Ulcers on both feet.  Pt asleep at time of visit and would not arose to name being called.  He is now on a heart healthy/carb modified diet. Intake continues to be good; PO: 100%. Meal tray at bedside presently unattempted due to pt sleeping.  He continues on Prostat and Glucerna supplements. However, noted that pt has very poor acceptance of supplements.  Noted wt gainl, likely due to fluid retention. Pt on IV lasix for diuresis. Pt is also pending oral surgery due to tooth extractions.   Labs reviewed. K: 3.4, Creat: 2.20, Calcium: 7.9, Glucose: 116.   Height: Ht Readings from Last 1 Encounters:  01/09/15  (1.88 m)    Weight: Wt Readings from Last 1 Encounters:  01/19/15 235 lb 1.6 oz (106.641 kg)   01/09/15 226 lb 6.4 oz (102.694 kg)    BMI:  Body mass index is 30.17 kg/(m^2). Obesity, class I  Re-Estimated Nutritional Needs: Kcal: 2200-2400 Protein: 110-125 grams Fluid: 2.2 - 2.4 L/day  Skin: Diabetic pressure ulcers bilaterally  Diet Order: Diet heart healthy/carb modified   Intake/Output Summary (Last 24 hours) at 01/19/15 1207 Last data filed at 01/19/15 1038  Gross per 24 hour  Intake    840 ml  Output   4201 ml  Net  -3361 ml    Last BM: 1/18  Labs:   Recent Labs Lab 01/13/15 0503   01/17/15 0535 01/18/15 0357 01/19/15 0321  NA 134*  < > 132* 135 137  K 3.1*  < > 3.6 3.3* 3.4*  CL 101  < > 105 104 104  CO2 22  < > BUN 37*  < > CREATININE 3.05*  < > 1.86* 2.13* 2.20*  CALCIUM 8.0*  < > 7.6* 8.1* 7.9*  MG 2.3  --   --   --   --   GLUCOSE 123*  < > 228* 67* 116*  < > = values in this interval not displayed.  CBG (last 3)   Recent Labs  01/18/15 2144 01/19/15 0609 01/19/15 1054  GLUCAP 113* 94 227*    Scheduled Meds: . ampicillin (OMNIPEN) IV  2 g Intravenous 6 times per day  . bisacodyl  10 mg Rectal Once  . chlorhexidine  15 mL Mouth/Throat BID  . feeding supplement (GLUCERNA SHAKE)  237 mL Oral BID BM  . feeding supplement (PRO-STAT SUGAR FREE 64)  30 mL Oral Q1500  . furosemide  40 mg Intravenous BID  . hydrALAZINE  25 mg Oral 3 times per day  . Influenza vac split quadrivalent PF  0.5 mL Intramuscular Tomorrow-1000  . insulin aspart  0-15 Units Subcutaneous QAC lunch  . insulin aspart  0-5 Units Subcutaneous QHS  . insulin aspart protamine- aspart  28 Units Subcutaneous BID WC  . living well with  diabetes book   Does not apply Once  . metoprolol tartrate  50 mg Oral BID  . neomycin-bacitracin-polymyxin   Topical Daily  . nicotine  7 mg Transdermal Daily  . penicillin g continuous IV infusion  12 Million Units Intravenous Q12H  . sodium chloride  500 mL Intravenous Once    Continuous Infusions:    Past Medical History  Diagnosis Date  . Diabetes mellitus   . Hypertension   . Acute renal failure   . Bacteremia     MSSA   . Diabetes type 2, uncontrolled 01/09/2015  . Diabetic foot infection 01/15/2014  . 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 Essential hypertension, benign 01/18/2014  . Infective myositis of left thigh   . Noncompliance 01/18/2014  . Septic shock   . Smoker   . Staphylococcus aureus bacteremia with sepsis   . Tobacco abuse 01/15/2014  . Mitral regurgitation 01/13/2015    Severe by TEE    Past  Surgical History  Procedure Laterality Date  . I&d extremity Bilateral 01/16/2014    Procedure:  DEBRIDEMENT of bilateral foot ulcers;  Surgeon: Kathryne Hitchhristopher Y Blackman, MD;  [facility];  Service: Orthopedics;  Laterality: Bilateral;  . Tee without cardioversion N/A 01/13/2015    Procedure: TRANSESOPHAGEAL ECHOCARDIOGRAM (TEE);  Surgeon: Thurmon FairMihai Croitoru, MD;  [facility];  Service: Cardiovascular;  Laterality: N/A;    Loic Hobin A. Mayford KnifeWilliams, RD, LDN, CDE Pager: [phone] After hours Pager: [phone]

## 2015-01-19 NOTE — Progress Notes (Signed)
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 Problems:   Tobacco abuse   Noncompliance   Dehydration   Diarrhea   Diabetes type 2, uncontrolled   Hyperglycemia   Uncontrolled hypertension   Diabetic foot ulcers   Pain   Diabetic ulcer of left foot associated with type 2 diabetes mellitus   Diabetic ulcer of right foot associated with type 2 diabetes mellitus   Osteomyelitis   Shoulder pain   Thigh pain, musculoskeletal   Septic shock   Staphylococcus aureus bacteremia with sepsis   Smoker   Elevated troponin   Acute renal failure   Bacteremia   Septic joint of right shoulder region   Infective myositis of left thigh   Purulent pericarditis   Mitral regurgitation   Endocarditis   Back pain   Poor dentition   Discitis of thoracic region   Congestive heart failure   Leukocytosis    SUBJECTIVE  Continue to have SOB, also have intermittent CP, did not want to move around this morning. States CP worsen with moving or deep inhalation.  CURRENT MEDS . bisacodyl  10 mg Rectal Once  . chlorhexidine  15 mL Mouth/Throat BID  . feeding supplement (GLUCERNA SHAKE)  237 mL Oral BID BM  . feeding supplement (PRO-STAT SUGAR FREE 64)  30 mL Oral Q1500  . furosemide  40 mg Intravenous BID  . hydrALAZINE  25 mg Oral 3 times per day  . Influenza vac split quadrivalent PF  0.5 mL Intramuscular Tomorrow-1000  . insulin aspart  0-15 Units Subcutaneous QAC lunch  . insulin aspart  0-5 Units Subcutaneous QHS  . insulin aspart protamine- aspart  28 Units Subcutaneous BID WC  . living well with diabetes book   Does not apply Once  . metoprolol tartrate  50 mg Oral BID  . neomycin-bacitracin-polymyxin   Topical Daily  . nicotine  7 mg Transdermal Daily  . penicillin g continuous IV infusion  12 Million Units Intravenous Q12H  . sodium chloride  500 mL Intravenous Once    OBJECTIVE  Filed  Vitals:   01/18/15 0431 01/18/15 1407 01/18/15 2013 01/19/15 0418  BP: 156/92 144/83 126/60 127/59  Pulse: 96 90 101 88  Temp: 98.4 F (36.9 C) 98.6 F (37 C) 99.1 F (37.3 C) 99.2 F (37.3 C)  TempSrc: Oral Oral Oral Oral  Resp: Height:      Weight:   199 lb 9.6 oz (90.538 kg) 235 lb 1.6 oz (106.641 kg)  SpO2: 99% 99% 98% 97%    Intake/Output Summary (Last 24 hours) at 01/19/15 0754 Last data filed at 01/19/15 1610  Gross per 24 hour  Intake    720 ml  Output   3551 ml  Net  -2831 ml   Filed Weights   01/17/15 1100 01/18/15 2013 01/19/15 0418  Weight: 242 lb 9.6 oz (110.043 kg) 199 lb 9.6 oz (90.538 kg) 235 lb 1.6 oz (106.641 kg)    PHYSICAL EXAM  General: Pleasant, NAD. Neuro: Alert and oriented X 3. Moves all extremities spontaneously. Psych: Normal affect. HEENT:  Normal  Neck: Supple without bruits +JVD. Lungs:  Resp regular and unlabored, CTA with decreased breath sound in bilateral bases Heart: RRR no s3, s4. 3/6 systolic murmur Abdomen: Soft, non-tender, non-distended, BS + x 4.  Extremities: No clubbing, cyanosis. DP/PT/Radials 2+ and equal bilaterally. 1-2+ pitting edema  Accessory Clinical Findings  CBC  Recent Labs  01/18/15 0357 01/19/15 0321  WBC 20.2* 13.7*  HGB 9.8* 8.9*  HCT 29.6* 27.0*  MCV 86.0 86.3  PLT 506* 434*   Basic Metabolic Panel  Recent Labs  01/18/15 0357 01/19/15 0321  NA 135 137  K 3.3* 3.4*  CL 104 104  CO2 21 29  GLUCOSE 67* 116*  BUN 14 18  CREATININE 2.13* 2.20*  CALCIUM 8.1* 7.9*   Liver Function Tests  Recent Labs  01/18/15 0357 01/19/15 0321  AST 22 17  ALT 16 13  ALKPHOS 202* 185*  BILITOT 1.6* 0.9  PROT 6.5 5.8*  ALBUMIN 1.3* 1.2*   No results for input(s): LIPASE, AMYLASE in the last 72 hours. Cardiac Enzymes  Recent Labs  01/19/15 0321  CKTOTAL 152    TELE NSR with HR 90s    ECG  No new EKG  Echocardiogram 01/13/2015  LV EF: 60% -   65%  ------------------------------------------------------------------- Study Conclusions  - Left ventricle: The cavity size was normal. There was moderate concentric hypertrophy. Systolic function was normal. The estimated ejection fraction was in the range of 60% to 65%. Wall motion was normal; there were no regional wall motion abnormalities. - Aortic valve: No evidence of vegetation. 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 There is an associated 4x6 mm vegetation. There is severe mitral insufficiency through the perforation in the posterior leaflet with reversal of flow in both the left and right pulmonary veins. - Left atrium: The atrium was mildly dilated. No evidence of thrombus in the atrial cavity or appendage. No spontaneous echo contrast was observed. - Right atrium: No evidence of thrombus in the atrial cavity or appendage. No evidence of thrombus in the atrial cavity or appendage. - Atrial septum: No defect or patent foramen ovale was identified. - Tricuspid valve: No evidence of vegetation. - Pulmonic valve: No evidence of vegetation. - Pericardium, extracardiac: A small pericardial effusion was identified circumferential to the heart. The fluid exhibited a fibrinous appearance.There was no evidence of hemodynamic compromise.     Radiology/Studies  Dg Orthopantogram  01/14/2015   CLINICAL DATA:  Staphylococcal endocarditis.  Poor dentition.  EXAM: ORTHOPANTOGRAM/PANORAMIC  COMPARISON:  None.  FINDINGS: Extensive dental disease with caries involving multiple teeth, including numbers 7, 8, 9, 10, 11, 12, 13, 14, 15, 16, 19, 20, 29, 30, 31 and 32. Periapical lucencies are present adjacent to teeth numbers 29 and 30.  IMPRESSION: Extensive dental disease as described.   Electronically Signed   By: Hulan Saas M.D.   On: 01/14/2015 21:31   Dg  Chest 2 View  01/14/2015   CLINICAL DATA:  Right shoulder pain for 6 days.  EXAM: CHEST  2 VIEW  COMPARISON:  01/10/2015  FINDINGS: Two views of the chest demonstrate low lung volumes with bibasilar chest densities, left side greater than right. In addition, the heart appears to be enlarged. The configuration of the heart raises the possibility for pericardial fluid. No evidence for pulmonary edema. Difficult to exclude small effusions, particularly on the left side.  IMPRESSION: Low lung volumes with bibasilar densities, left side greater than right. Basilar densities could represent a combination of atelectasis and pleural fluid.  Heart appears enlarged which may be accentuated by low lung volumes. Pericardial fluid cannot be excluded.   Electronically Signed   By: Richarda Overlie M.D.   On: 01/14/2015 08:17   Dg Shoulder Right  01/09/2015   CLINICAL DATA:  Anterior right shoulder pain  EXAM: RIGHT SHOULDER - 2+ VIEW  COMPARISON:  None.  FINDINGS: No fracture or dislocation is seen.  The joint spaces are preserved.  The visualized soft tissues are unremarkable.  Visualized right lung is clear.  IMPRESSION: No fracture or dislocation is seen.   Electronically Signed   By: Charline Bills M.D.   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  EXAM: MRI HEAD WITHOUT CONTRAST  TECHNIQUE: Multiplanar, multiecho pulse sequences of the brain and surrounding structures were obtained without intravenous contrast.  COMPARISON:  Prior study from 04/25/2012  FINDINGS: Cerebral volume within normal limits for patient age. No significant chronic small vessel ischemic changes identified. No mass lesion, midline shift, or mass effect. Ventricles are normal in size without evidence of hydrocephalus. No extra-axial fluid collection.  There are multiple subcentimeter ischemic infarcts involving predominantly the supratentorial  brain. These involve both frontal lobes, the right parietal lobe, left temporal lobe, ischemic infarct within the right is cerebral hemisphere is located in the anterior right frontal lobe and measures 5 mm (series 3, image 36). The largest infarct in the left cerebral hemisphere is located in the deep white matter of the anterior left frontal lobe and measures 13 mm (series 3, image 30). There are infarcts involving the bilateral basal ganglia, the most prominent of which located in the right thalamus which measures 4 mm. There is a single infratentorial infarct within the left cerebellar hemisphere measuring 4 mm. These are most likely embolic in nature given the history of endocarditis and multiple vascular distributions. Scattered susceptibility artifact seen associated with a few of these infarcts, likely reflecting small petechial hemorrhages. No frank hemorrhagic conversion There is associated T2/FLAIR signal intensity without significant mass effect.  Craniocervical junction within normal limits. Pituitary gland normal.  No acute abnormality seen about the orbits.  Paranasal sinuses are clear.  No mastoid effusion.  Bone marrow signal intensity within normal limits. Visualized upper cervical spine unremarkable.  Scalp soft tissues within normal limits.  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 These are likely embolic in nature given the history of endocarditis and involvement of multiple vascular distributions. No significant mass effect. There are likely scattered small petechial hemorrhages with a number these infarcts without frank hemorrhage.   Electronically Signed   By: Rise Mu M.D.   On: 01/14/2015 21:24   Mr Thoracic Spine Wo Contrast  01/15/2015   ADDENDUM REPORT: 01/15/2015 14:40  ADDENDUM: Study discussed by telephone with Dr. Paulette Blanch DAM on 01/15/2015 at 1435 hrs.   Electronically Signed   By: Augusto [name] M.D.    On: 01/15/2015 14:40   01/15/2015   CLINICAL DATA:  35 year old male with sepsis and back pain. Into card itis, purulent pericarditis, multiple small embolic infarcts. Initial encounter.  EXAM: MRI THORACIC AND LUMBAR SPINE WITHOUT CONTRAST  TECHNIQUE: Multiplanar and multiecho pulse sequences of the thoracic and lumbar spine were obtained without intravenous contrast.  COMPARISON:  Chest radiographs 01/14/2015. CT Abdomen and Pelvis 05/18/2013.  FINDINGS: MR THORACIC SPINE FINDINGS  Limited sagittal imaging of the cervical spine is unremarkable.  Preserved thoracic vertebral height and alignment, preserved thoracic vertebral height and alignment.  Throughout much of the T3 and T4 vertebral bodies there is heterogeneous T2 signal as well as patchy pre STIR signal. The STIR  images were repeated, an the same patchy increased signal persisted at T3 and T4.  No definite signal abnormality in the intervening disc, or adjacent disc spaces; there is widespread intervertebral disc T2 hyperintensity, as a normal finding seen in young patients.  No definite paraspinal or epidural inflammation at those levels.  No suspicious marrow signal elsewhere; intrinsic T1 and T2 hyperintensity at T6 is a benign vertebral body hemangioma.  However, there are layering pleural effusions left greater than right. Negative visualized upper abdominal viscera.  Epidural lipomatosis throughout the thoracic spine and effaces CSF from the thecal sac from the T5 to the T9 level. No definite spinal cord signal abnormality.  MR LUMBAR SPINE FINDINGS  In the lumbar spine T1-T2 and stir marrow signal appears diminished. This is also true in the visible pelvis. Possible associated decreased T2 signal in both the liver and spleen.  Otherwise negative visualized abdominal viscera.  There is mildly increased STIR signal in the lower lumbar erector spinae muscles bilaterally, at L4-L5 (series 17, images 9 and 1). Subcutaneous edema incidentally noted.   Normal lumbar vertebral height and alignment and no marrow edema or acute osseous abnormality in the lumbar spine.  No definite signal abnormality in the lower thoracic spinal cord, conus at L1-L2.  Lumbar facet hypertrophy.  No lumbar spinal stenosis.  IMPRESSION: MR THORACIC SPINE IMPRESSION  1. Abnormal marrow edema in the T3 and T4 vertebral bodies highly suspicious for acute osteomyelitis in this setting. No associated epidural or paraspinal inflammation identified. 2. No other acute findings in the thoracic spine. Epidural lipomatosis. 3. Left greater than right layering pleural effusions.  MR LUMBAR SPINE IMPRESSION  1. Mild increased signal in the L4-L5 level erector spinae muscles bilaterally, which could be infectious myositis but also this appearance can be seen without infection in chronically debilitated patients. 2. No other acute finding in the lumbar spine. 3. Decreased bone marrow signal suggestive of red marrow conversion versus hemosiderosis.  Electronically Signed: By: Augusto [name] M.D. On: 01/15/2015 14:20   Mr Lumbar Spine Wo Contrast  01/15/2015   ADDENDUM REPORT: 01/15/2015 14:40  ADDENDUM: Study discussed by telephone with Dr. Paulette Blanch DAM on 01/15/2015 at 1435 hrs.   Electronically Signed   By: Augusto [name] M.D.   On: 01/15/2015 14:40   01/15/2015   CLINICAL DATA:  35 year old male with sepsis and back pain. Into card itis, purulent pericarditis, multiple small embolic infarcts. Initial encounter.  EXAM: MRI THORACIC AND LUMBAR SPINE WITHOUT CONTRAST  TECHNIQUE: Multiplanar and multiecho pulse sequences of the thoracic and lumbar spine were obtained without intravenous contrast.  COMPARISON:  Chest radiographs 01/14/2015. CT Abdomen and Pelvis 05/18/2013.  FINDINGS: MR THORACIC SPINE FINDINGS  Limited sagittal imaging of the cervical spine is unremarkable.  Preserved thoracic vertebral height and alignment, preserved thoracic vertebral height and alignment.  Throughout much of the T3 and  T4 vertebral bodies there is heterogeneous T2 signal as well as patchy pre STIR signal. The STIR images were repeated, an the same patchy increased signal persisted at T3 and T4.  No definite signal abnormality in the intervening disc, or adjacent disc spaces; there is widespread intervertebral disc T2 hyperintensity, as a normal finding seen in young patients.  No definite paraspinal or epidural inflammation at those levels.  No suspicious marrow signal elsewhere; intrinsic T1 and T2 hyperintensity at T6 is a benign vertebral body hemangioma.  However, there are layering pleural effusions left greater than right. Negative visualized upper abdominal viscera.  Epidural  lipomatosis throughout the thoracic spine and effaces CSF from the thecal sac from the T5 to the T9 level. No definite spinal cord signal abnormality.  MR LUMBAR SPINE FINDINGS  In the lumbar spine T1-T2 and stir marrow signal appears diminished. This is also true in the visible pelvis. Possible associated decreased T2 signal in both the liver and spleen.  Otherwise negative visualized abdominal viscera.  There is mildly increased STIR signal in the lower lumbar erector spinae muscles bilaterally, at L4-L5 (series 17, images 9 and 1). Subcutaneous edema incidentally noted.  Normal lumbar vertebral height and alignment and no marrow edema or acute osseous abnormality in the lumbar spine.  No definite signal abnormality in the lower thoracic spinal cord, conus at L1-L2.  Lumbar facet hypertrophy.  No lumbar spinal stenosis.  IMPRESSION: MR THORACIC SPINE IMPRESSION  1. Abnormal marrow edema in the T3 and T4 vertebral bodies highly suspicious for acute osteomyelitis in this setting. No associated epidural or paraspinal inflammation identified. 2. No other acute findings in the thoracic spine. Epidural lipomatosis. 3. Left greater than right layering pleural effusions.  MR LUMBAR SPINE IMPRESSION  1. Mild increased signal in the L4-L5 level erector spinae  muscles bilaterally, which could be infectious myositis but also this appearance can be seen without infection in chronically debilitated patients. 2. No other acute finding in the lumbar spine. 3. Decreased bone marrow signal suggestive of red marrow conversion versus hemosiderosis.  Electronically Signed: By: Augusto [name] M.D. On: 01/15/2015 14:20   US Renal  01/12/2015   CLINICAL DATA:  Acute renal failure  EXAM: RENAL/URINARY TRACT ULTRASOUND COMPLETE  COMPARISON:  None.  FINDINGS: Right Kidney:  Length: 12.8 cm in length. Diffuse increased cortical echogenicity suspicious for medical renal disease. No hydronephrosis. No renal calculus.  Left Kidney:  Length: . 12.6 cm in length. Diffuse increased echogenicity suspicious for medical renal disease. No hydronephrosis. No diagnostic renal calculus.  Bladder:  Appears normal for degree of bladder distention.  IMPRESSION: Bilateral kidneys shows diffuse increased echogenicity. Medical renal disease cannot be excluded. No hydronephrosis or diagnostic renal calculus.   Electronically Signed   By: Natasha Mead M.D.   On: 01/12/2015 16:08   Mr Shoulder Right Wo Contrast  01/11/2015   CLINICAL DATA:  Right shoulder pain.  EXAM: MRI OF THE RIGHT SHOULDER WITHOUT CONTRAST  TECHNIQUE: Multiplanar, multisequence MR imaging of the shoulder was performed. No intravenous contrast was administered.  COMPARISON:  Radiographs dated 01/09/2015  FINDINGS: Rotator cuff:  Normal.  Muscles:  Normal.  Biceps long head: Properly located and intact. There is inflammation in the soft tissues around the bicipital tendon sheath with fluid in the bicipital tendon sheath.  Acromioclavicular Joint: Small degenerative cyst in the acromion. Otherwise normal AC joint. Type 1 acromion. No bursitis.  Glenohumeral Joint: There is a moderate glenohumeral joint effusion with extravasation of contrast from the subcoracoid recess of the joint into the adjacent soft tissues. Fluid distends the bicipital  tendon sheath.  Labrum:  Intact.  Bones: No significant abnormalities. Tiny degenerative cystic areas in the posterior aspect of the greater tuberosity of the proximal humerus.  IMPRESSION: 1. Moderate glenohumeral joint effusion with some extravasation of joint. Fluid distends the bicipital tendon sheath with inflammation/extravasated joint fluid around the bicipital tendon sheath. The possibly of synovitis should be considered. 2. Normal rotator cuff.   Electronically Signed   By: Geanie Cooley M.D.   On: 01/11/2015 11:21   Mr Tibia Fibula Right Wo Contrast  01/15/2015  CLINICAL DATA:  Diabetic patient with bacteremia and right leg pain.  EXAM: MRI OF LOWER RIGHT EXTREMITY WITHOUT CONTRAST  TECHNIQUE: Multiplanar, multisequence MR imaging of the right lower leg was performed. No intravenous contrast was administered.  COMPARISON:  None.  FINDINGS: The axial and coronal sequences incidentally include the left lower leg. There is marked subcutaneous edema of both lower legs which appears slightly worse on the left. No focal subcutaneous fluid collection is identified. Mildly increased T2 signal is seen in the gastrocnemius, soleus and and tibialis posterior muscles bilaterally which appears symmetric. No intramuscular fluid collection is identified. No bone marrow signal abnormality to suggest osteomyelitis is seen. There is no muscle or tendon tear.  IMPRESSION: Intense bilateral lower leg subcutaneous edema appears worse on the left and could be due to dependent change or cellulitis.  Mildly increased T2 signal in the gastrocnemius, soleus and tibialis posterior muscles bilaterally could be due to myositis or early changes of denervation atrophy in this diabetic patient.  Negative for abscess or osteomyelitis.   Electronically Signed   By: Drusilla Kannerhomas  Dalessio M.D.   On: 01/15/2015 08:28   Mr Foot Right Wo Contrast  01/11/2015   CLINICAL DATA:  Diabetic foot ulcers.  EXAM: MRI OF THE RIGHT FOREFOOT WITHOUT  CONTRAST  TECHNIQUE: Multiplanar, multisequence MR imaging was performed. No intravenous contrast was administered.  COMPARISON:  Radiographs dated 01/09/2011  FINDINGS: There are small erosions of the proximal phalanx at the IP joint of the great toe and there are small erosions at the first metatarsal phalangeal joint, both consistent with gout. There are no soft tissue abscesses. There is no osteomyelitis. There is nonspecific edema in the subcutaneous fat of the dorsum of the foot. Muscles and tendons and ligaments appear appear normal. No joint effusions.  IMPRESSION: Subcutaneous edema primarily on the dorsum of the foot. 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   Electronically Signed   By: Geanie CooleyJim  Maxwell M.D.   On: 01/11/2015 12:50   Mr Femur Left Wo Contrast  01/12/2015   CLINICAL DATA:  Diabetes. Peripheral neuropathy. Sepsis. Bilateral thigh pain. RIGHT thigh pain. Tenderness in the medial aspect of the thigh.  EXAM: MR OF THE LEFT FEMUR WITHOUT CONTRAST  TECHNIQUE: Multiplanar, multisequence MR imaging was performed. No intravenous contrast was administered.  COMPARISON:  None.  FINDINGS: Diffuse subcutaneous edema is present around the pelvis and in both thighs, most compatible with anasarca. This could also represent cellulitis in the appropriate clinical setting. Subcutaneous fluid is present in the lateral LEFT thigh, without a discrete abscess. There is myositis of the LEFT vastus lateralis with mild muscular edema av but no significant swelling. No fatty atrophy.  Bone marrow signal shows heterogenous marrow. This is a nonspecific finding most commonly associated with obesity, anemia, cigarette smoking or chronic disease. Negative for hip fracture or AVN. There is no evidence of osteomyelitis. No deep soft tissue abscesses are present.  Bilaterally in the groin, there are prominent lymph nodes which are probably reactive to lower extremity process.  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  IMPRESSION: 1. Diffuse subcutaneous edema in the pelvis and thighs bilaterally, suggesting anasarca. 2. More prominent subcutaneous edema along the LEFT thigh with subcutaneous fluid superficial to the muscular fascia, likely representing cellulitis. No abscess. 3. Myositis of the proximal vastus lateralis, probably infectious. Reactive edema could produce this appearance as well. 4. Reactive inguinal adenopathy bilaterally. 5. Negative for osteomyelitis.  Electronically Signed   By: Andreas Newport M.D.   On: 01/12/2015 14:33   Mr Foot Left Wo Contrast  01/11/2015   CLINICAL DATA:  Diabetic foot ulcers.  Foot pain.  Bony erosions.  EXAM: MRI OF THE LEFT FOREFOOT WITHOUT CONTRAST  TECHNIQUE: Multiplanar, multisequence MR imaging was performed. No intravenous contrast was administered.  COMPARISON:  Radiograph dated 01/09/2015  FINDINGS: There is slight edema throughout the medial cuneiform. There is artifactual increased signal from the metatarsal heads in from the proximal phalanx of the great toe. There are erosions at IP joint and first metatarsal phalangeal joint consistent with gout.  There are no soft tissue abscesses and there is no osteomyelitis. There is prominent edema in the subcutaneous fat of the dorsum of the foot.  IMPRESSION: No evidence of osteomyelitis or abscess. The edema in the medial cuneiform is most likely a stress reaction. 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   Electronically Signed   By: Geanie Cooley M.D.   On: 01/11/2015 12:15   Dg Chest Port 1 View  01/10/2015   CLINICAL DATA:  Fever and sweats.  EXAM: PORTABLE CHEST - 1 VIEW  COMPARISON:  None.  FINDINGS: Low lung volumes with vascular crowding and streaky subsegmental atelectasis. No obvious infiltrate or effusion. The heart is within normal limits in size given the AP portable technique. The bony structures are grossly normal.   IMPRESSION: Low lung volumes with vascular crowding and bibasilar atelectasis.   Electronically Signed   By: Loralie Champagne M.D.   On: 01/10/2015 11:27   Dg Foot Complete Left  01/09/2015   CLINICAL DATA:  35 year old male with recurrent bilateral foot pain.  EXAM: LEFT FOOT - COMPLETE 3+ VIEW  COMPARISON:  01/05/2014.  FINDINGS: Large periarticular erosions are noted adjacent to the first MTP joint, and at the first interphalangeal joint along the medial margin in both locations. These have erosions have sclerotic margins and overhanging edges, and there is adjacent soft tissue prominence, suggestive of gouty erosions. No adjacent soft tissue calcification is noted. No acute displaced fracture, subluxation or dislocation. Small heterotopic ossification adjacent to the plantar aspect of the calcaneus, likely ossification within the plantar fascia.  IMPRESSION: 1. Findings, as above, suggestive of underlying gout. Clinical correlation is recommended.   Electronically Signed   By: Trudie Reed M.D.   On: 01/09/2015 09:46   Dg Foot Complete Right  01/09/2015   CLINICAL DATA:  Recurring bilateral foot pain  EXAM: RIGHT FOOT COMPLETE - 3+ VIEW  COMPARISON:  None.  FINDINGS: No fracture or dislocation is seen.  Marginal erosion at the medial aspect of the 1st IP joint, involving the proximal phalanx.  The visualized soft tissues are unremarkable.  IMPRESSION: Marginal erosion at the medial aspect of the 1st IP joint. Correlate for inflammatory arthropathy such as gout.  Otherwise, no acute osseus abnormality is seen.   Electronically Signed   By: Charline Bills M.D.   On: 01/09/2015 09:44   Dg Fluoro Guide Ndl Plc/bx  01/14/2015   CLINICAL DATA:  Right shoulder pain and effusion.  Bacteremia.  EXAM: RIGHT SHOULDER ASPIRATION UNDER FLUOROSCOPY  FLUOROSCOPY TIME:  0 min 24 seconds  PROCEDURE: Overlying skin prepped with Betadine, draped in the usual sterile fashion, and infiltrated locally with buffered  Lidocaine. Twenty gauge spinal needle was placed in the right glenohumeral joint under direct fluoroscopic visualization.  2 cc of bloody cloudy fluid was aspirated from the joint.  Mr. Hartig tolerated the procedure well.  IMPRESSION: Technically successful right shoulder aspiration under fluoroscopy. Fluid sent for laboratory analysis.   Electronically Signed   By: Geanie Cooley M.D.   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    1. Elevated trop: in the setting of endocarditis, myocardial inflammation.  2. 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 Barry Dienes with CT surgery, discussed with Dr. Barry Dienes, would prefer the bacteremia to clear up first, however we are concerned about the degree his MR worsened in 2 days between TTE and TEE. May need earlier surgery his start to have HF symptom. Has shown to have septic emboli to the brain based on MRI of brain. ?if has emboli to kidney as well, however prefer not to have CTA of abdomen given recent decline in renal function  - Pericarditis, small effusion. Discussed. Need tooth extraction prior to valvular surgery - per Dr. Cornelius Moras, ideally need cardiac cath, LE angiography to r/o septic emboli to LLE arterial system, however understandably his renal function is prohibitive of such test now. He will need dental eval prior to surgery. Vascular consulted, no further intervention at this time. LE venous duplex negative for DVT  - if shoulder surgery is necessary, moderate cardiac risk (based on severe MR and underlying endocarditis) for shoulder debridement, and may  proceed if benefit felt to outweigh risk . EF normal. At this time, ortho team recommends med mgt. S/p aspiration.  - continue to have significant pitting edema in LE, diuresed -2.8 yesterday, Cr mildly went up. ?septic emboi to kidney. Continue IV lasix for now. Currently 6+ L total. Nafcillin changed to penicillin by ID given renal function. Per ortho, no plan for intervention, R shoulder stable  3. staph bacteremia, MSSA- ID, penicillin. Now MRI of spine concerning for osteo.   - blood culture obtained yesterday again after WBC went up, now trended down again 4. PVD, LE ABI 0.64. Vascular evaluated. No DVT, Monitor.  5. Acute on chronic renal insufficiency: improving on IV fluids, slightly worsened after diuresis 6. Hypoalbuminemia 7. Hyponatremia - Suspect this is related to CHF at this point, given his +++ volume status. D/c IV fluids, give lasix today. Follow creatinine. Check BNP.   Signed, Azalee Course PA-C Pager: [phone]

## 2015-01-19 NOTE — Progress Notes (Signed)
01/19/2015  Patient:            Rob HickmanBenjamin Mancusi Date of Birth:  03/03/1980 MRN:                [id number]   BP 127/59 mmHg  Pulse 88  Temp(Src) 99.2 F (37.3 C) (Oral)  Resp 20  Ht 6\' 2"  (1.88 m)  Wt 235 lb 1.6 oz (106.641 kg)  BMI 30.17 kg/m2  SpO2 97%  I presented to the patient's room to discuss anticipated plan of care. Patient was sitting up in bed in no acute distress.  We discussed the risks, benefits, complications of various treatment options for the patient in relationship to his medical and dental conditions, bacterial endocarditis, severe mitral valve disease, and the anticipated need for a mitral valve replacement heart surgery. We discussed proceeding with no treatment, total and subtotal extractions with alveoloplasty, pre-prosthetic surgery as indicated, periodontal therapy, dental restorations, root canal therapy, crown and bridge therapy, implant therapy, and replacement missing teeth is indicated after adequate healing by the dentist of his choice. 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 I had a conversation with Dr. Cornelius Moraswen this morning and he indicated that patient could be scheduled in the near future for these operative procedures. I will attempt to schedule the patient as time and space permits in the operating room for later this week or early next week.  Charlynne Panderonald F Kulinski, DDS

## 2015-01-20 DIAGNOSIS — N170 Acute kidney failure with tubular necrosis: Secondary | ICD-10-CM

## 2015-01-20 LAB — GLUCOSE, CAPILLARY
GLUCOSE-CAPILLARY: 93 mg/dL (ref 70–99)
Glucose-Capillary: 160 mg/dL — ABNORMAL HIGH (ref 70–99)
Glucose-Capillary: 118 mg/dL — ABNORMAL HIGH (ref 70–99)
Glucose-Capillary: 95 mg/dL (ref 70–99)

## 2015-01-20 LAB — DIFFERENTIAL
Basophils Absolute: 0 10*3/uL (ref 0.0–0.1)
Basophils Absolute: 0 10*3/uL (ref 0.0–0.1)
Basophils Relative: 0 % (ref 0–1)
Basophils Relative: 0 % (ref 0–1)
Eosinophils Absolute: 0.2 10*3/uL (ref 0.0–0.7)
Eosinophils Absolute: 0.1 10*3/uL (ref 0.0–0.7)
Eosinophils Relative: 1 % (ref 0–5)
Eosinophils Relative: 1 % (ref 0–5)
LYMPHS PCT: 10 % — AB (ref 12–46)
LYMPHS PCT: 8 % — AB (ref 12–46)
Lymphs Abs: 1.5 10*3/uL (ref 0.7–4.0)
Lymphs Abs: 1.1 10*3/uL (ref 0.7–4.0)
MONO ABS: 0.9 10*3/uL (ref 0.1–1.0)
Monocytes Absolute: 0.6 10*3/uL (ref 0.1–1.0)
Monocytes Relative: 6 % (ref 3–12)
Monocytes Relative: 4 % (ref 3–12)
NEUTROS ABS: 11.7 10*3/uL — AB (ref 1.7–7.7)
NEUTROS PCT: 87 % — AB (ref 43–77)
Neutro Abs: 12.2 10*3/uL — ABNORMAL HIGH (ref 1.7–7.7)
Neutrophils Relative %: 83 % — ABNORMAL HIGH (ref 43–77)

## 2015-01-20 LAB — CBC
HCT: 28.6 % — ABNORMAL LOW (ref 39.0–52.0)
HEMOGLOBIN: 9.3 g/dL — AB (ref 13.0–17.0)
MCH: 28.7 pg (ref 26.0–34.0)
MCHC: 32.5 g/dL (ref 30.0–36.0)
MCV: 88.3 fL (ref 78.0–100.0)
PLATELETS: 467 10*3/uL — AB (ref 150–400)
RBC: 3.24 MIL/uL — AB (ref 4.22–5.81)
RDW: 14.2 % (ref 11.5–15.5)
WBC: 14.3 10*3/uL — ABNORMAL HIGH (ref 4.0–10.5)

## 2015-01-20 LAB — BASIC METABOLIC PANEL
Anion gap: 8 (ref 5–15)
BUN: 17 mg/dL (ref 6–23)
CO2: 30 mmol/L (ref 19–32)
CREATININE: 1.96 mg/dL — AB (ref 0.50–1.35)
Calcium: 7.9 mg/dL — ABNORMAL LOW (ref 8.4–10.5)
Chloride: 97 mEq/L (ref 96–112)
GFR calc Af Amer: 50 mL/min — ABNORMAL LOW (ref >90)
GFR calc non Af Amer: 43 mL/min — ABNORMAL LOW (ref >90)
Glucose, Bld: 184 mg/dL — ABNORMAL HIGH (ref 70–99)
Potassium: 4.1 mmol/L (ref 3.5–5.1)
Sodium: 135 mmol/L (ref 135–145)

## 2015-01-20 LAB — CULTURE, BLOOD (ROUTINE X 2)
CULTURE: NO GROWTH
CULTURE: NO GROWTH

## 2015-01-20 NOTE — Progress Notes (Signed)
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 Problems:   Tobacco abuse   Noncompliance   Dehydration   Diarrhea   Diabetes type 2, uncontrolled   Hyperglycemia   Uncontrolled hypertension   Diabetic foot ulcers   Pain   Diabetic ulcer of left foot associated with type 2 diabetes mellitus   Diabetic ulcer of right foot associated with type 2 diabetes mellitus   Osteomyelitis   Shoulder pain   Thigh pain, musculoskeletal   Septic shock   Staphylococcus aureus bacteremia with sepsis   Smoker   Elevated troponin   Acute renal failure   Bacteremia   Septic joint of right shoulder region   Infective myositis of left thigh   Purulent pericarditis   Mitral regurgitation   Endocarditis   Back pain   Poor dentition   Discitis of thoracic region   Congestive heart failure   Leukocytosis    SUBJECTIVE  Continue to have SOB, also have intermittent CP, did not want to move around this morning. States CP worsen with moving or deep inhalation.  CURRENT MEDS . ampicillin (OMNIPEN) IV  2 g Intravenous 6 times per day  . bisacodyl  10 mg Rectal Once  . chlorhexidine  15 mL Mouth/Throat BID  . feeding supplement (PRO-STAT SUGAR FREE 64)  30 mL Oral TID WC  . furosemide  40 mg Intravenous BID  . hydrALAZINE  25 mg Oral 3 times per day  . Influenza vac split quadrivalent PF  0.5 mL Intramuscular Tomorrow-1000  . insulin aspart  0-15 Units Subcutaneous QAC lunch  . insulin aspart  0-5 Units Subcutaneous QHS  . insulin aspart protamine- aspart  28 Units Subcutaneous BID WC  . living well with diabetes book   Does not apply Once  . metoprolol tartrate  50 mg Oral BID  . neomycin-bacitracin-polymyxin   Topical Daily  . nicotine  7 mg Transdermal Daily  . sodium chloride  500 mL Intravenous Once    OBJECTIVE  Filed Vitals:   01/19/15 0418 01/19/15 1409 01/19/15 2015 01/20/15 0359  BP: 127/59  131/80 146/75 117/64  Pulse: 88 77 85 76  Temp: 99.2 F (37.3 C) 98.9 F (37.2 C) 99.1 F (37.3 C) 98.7 F (37.1 C)  TempSrc: Oral Oral Oral Oral  Resp: 20 18 19 20   Height:      Weight: 235 lb 1.6 oz (106.641 kg)   234 lb 1.6 oz (106.187 kg)  SpO2: 97% 100% 100% 100%    Intake/Output Summary (Last 24 hours) at 01/20/15 0801 Last data filed at 01/20/15 0235  Gross per 24 hour  Intake    720 ml  Output   3100 ml  Net  -2380 ml   Filed Weights   01/18/15 2013 01/19/15 0418 01/20/15 0359  Weight: 199 lb 9.6 oz (90.538 kg) 235 lb 1.6 oz (106.641 kg) 234 lb 1.6 oz (106.187 kg)    PHYSICAL EXAM  General: Pleasant, NAD. Neuro: Alert and oriented X 3. Moves all extremities spontaneously. Psych: Normal affect. HEENT:  Normal  Neck: Supple without bruits +JVD. Lungs:  Resp regular and unlabored, CTA with decreased breath sound in bilateral bases Heart: RRR no s3, s4. 3/6 systolic murmur Abdomen: Soft, non-tender, non-distended, BS + x 4.  Extremities: No clubbing, cyanosis. DP/PT/Radials 2+ and equal bilaterally. 1-2+ pitting edema  Accessory Clinical Findings  CBC  Recent Labs  01/19/15 0321 01/20/15 [id number]  WBC 13.7* 14.3*  HGB 8.9* 9.3*  HCT 27.0* 28.6*  MCV 86.3 88.3  PLT 434* 467*   Basic Metabolic Panel  Recent Labs  01/19/15 0321 01/20/15 0433  NA 137 135  K 3.4* 4.1  CL 104 97  CO2 29 30  GLUCOSE 116* 184*  BUN 18 17  CREATININE 2.20* 1.96*  CALCIUM 7.9* 7.9*   Liver Function Tests  Recent Labs  01/18/15 0357 01/19/15 0321  AST 22 17  ALT 16 13  ALKPHOS 202* 185*  BILITOT 1.6* 0.9  PROT 6.5 5.8*  ALBUMIN 1.3* 1.2*   No results for input(s): LIPASE, AMYLASE in the last 72 hours. Cardiac Enzymes  Recent Labs  01/19/15 0321  CKTOTAL 152    TELE NSR with HR 80s    ECG  No new EKG  Echocardiogram 01/13/2015  LV EF: 60% -  65%  ------------------------------------------------------------------- Study Conclusions  - Left  ventricle: The cavity size was normal. There was moderate concentric hypertrophy. Systolic function was normal. The estimated ejection fraction was in the range of 60% to 65%. Wall motion was normal; there were no regional wall motion abnormalities. - Aortic valve: No evidence of vegetation. 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 There is an associated 4x6 mm vegetation. There is severe mitral insufficiency through the perforation in the posterior leaflet with reversal of flow in both the left and right pulmonary veins. - Left atrium: The atrium was mildly dilated. No evidence of thrombus in the atrial cavity or appendage. No spontaneous echo contrast was observed. - Right atrium: No evidence of thrombus in the atrial cavity or appendage. No evidence of thrombus in the atrial cavity or appendage. - Atrial septum: No defect or patent foramen ovale was identified. - Tricuspid valve: No evidence of vegetation. - Pulmonic valve: No evidence of vegetation. - Pericardium, extracardiac: A small pericardial effusion was identified circumferential to the heart. The fluid exhibited a fibrinous appearance.There was no evidence of hemodynamic compromise.     Radiology/Studies  Dg Orthopantogram  01/14/2015   CLINICAL DATA:  Staphylococcal endocarditis.  Poor dentition.  EXAM: ORTHOPANTOGRAM/PANORAMIC  COMPARISON:  None.  FINDINGS: Extensive dental disease with caries involving multiple teeth, including numbers 7, 8, 9, 10, 11, 12, 13, 14, 15, 16, 19, 20, 29, 30, 31 and 32. Periapical lucencies are present adjacent to teeth numbers 29 and 30.  IMPRESSION: Extensive dental disease as described.   Electronically Signed   By: Hulan Saas M.D.   On: 01/14/2015 21:31   Dg Chest 2 View  01/14/2015   CLINICAL DATA:  Right shoulder pain for 6 days.  EXAM: CHEST  2 VIEW   COMPARISON:  01/10/2015  FINDINGS: Two views of the chest demonstrate low lung volumes with bibasilar chest densities, left side greater than right. In addition, the heart appears to be enlarged. The configuration of the heart raises the possibility for pericardial fluid. No evidence for pulmonary edema. Difficult to exclude small effusions, particularly on the left side.  IMPRESSION: Low lung volumes with bibasilar densities, left side greater than right. Basilar densities could represent a combination of atelectasis and pleural fluid.  Heart appears enlarged which may be accentuated by low lung volumes. Pericardial fluid cannot be excluded.   Electronically Signed   By: Richarda Overlie M.D.   On: 01/14/2015 08:17   Dg Shoulder Right  01/09/2015   CLINICAL DATA:  Anterior right shoulder  pain  EXAM: RIGHT SHOULDER - 2+ VIEW  COMPARISON:  None.  FINDINGS: No fracture or dislocation is seen.  The joint spaces are preserved.  The visualized soft tissues are unremarkable.  Visualized right lung is clear.  IMPRESSION: No fracture or dislocation is seen.   Electronically Signed   By: Charline Bills M.D.   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  EXAM: MRI HEAD WITHOUT CONTRAST  TECHNIQUE: Multiplanar, multiecho pulse sequences of the brain and surrounding structures were obtained without intravenous contrast.  COMPARISON:  Prior study from 04/25/2012  FINDINGS: Cerebral volume within normal limits for patient age. No significant chronic small vessel ischemic changes identified. No mass lesion, midline shift, or mass effect. Ventricles are normal in size without evidence of hydrocephalus. No extra-axial fluid collection.  There are multiple subcentimeter ischemic infarcts involving predominantly the supratentorial brain. These involve both frontal lobes, the right parietal lobe, left temporal lobe, ischemic  infarct within the right is cerebral hemisphere is located in the anterior right frontal lobe and measures 5 mm (series 3, image 36). The largest infarct in the left cerebral hemisphere is located in the deep white matter of the anterior left frontal lobe and measures 13 mm (series 3, image 30). There are infarcts involving the bilateral basal ganglia, the most prominent of which located in the right thalamus which measures 4 mm. There is a single infratentorial infarct within the left cerebellar hemisphere measuring 4 mm. These are most likely embolic in nature given the history of endocarditis and multiple vascular distributions. Scattered susceptibility artifact seen associated with a few of these infarcts, likely reflecting small petechial hemorrhages. No frank hemorrhagic conversion There is associated T2/FLAIR signal intensity without significant mass effect.  Craniocervical junction within normal limits. Pituitary gland normal.  No acute abnormality seen about the orbits.  Paranasal sinuses are clear.  No mastoid effusion.  Bone marrow signal intensity within normal limits. Visualized upper cervical spine unremarkable.  Scalp soft tissues within normal limits.  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 These are likely embolic in nature given the history of endocarditis and involvement of multiple vascular distributions. No significant mass effect. There are likely scattered small petechial hemorrhages with a number these infarcts without frank hemorrhage.   Electronically Signed   By: Rise Mu M.D.   On: 01/14/2015 21:24   Mr Thoracic Spine Wo Contrast  01/15/2015   ADDENDUM REPORT: 01/15/2015 14:40  ADDENDUM: Study discussed by telephone with Dr. Paulette Blanch DAM on 01/15/2015 at 1435 hrs.   Electronically Signed   By: Augusto [name] M.D.   On: 01/15/2015 14:40   01/15/2015   CLINICAL DATA:  35 year old male with sepsis and back  pain. Into card itis, purulent pericarditis, multiple small embolic infarcts. Initial encounter.  EXAM: MRI THORACIC AND LUMBAR SPINE WITHOUT CONTRAST  TECHNIQUE: Multiplanar and multiecho pulse sequences of the thoracic and lumbar spine were obtained without intravenous contrast.  COMPARISON:  Chest radiographs 01/14/2015. CT Abdomen and Pelvis 05/18/2013.  FINDINGS: MR THORACIC SPINE FINDINGS  Limited sagittal imaging of the cervical spine is unremarkable.  Preserved thoracic vertebral height and alignment, preserved thoracic vertebral height and alignment.  Throughout much of the T3 and T4 vertebral bodies there is heterogeneous T2 signal as well as patchy pre STIR signal. The STIR images were repeated, an the same patchy increased signal  persisted at T3 and T4.  No definite signal abnormality in the intervening disc, or adjacent disc spaces; there is widespread intervertebral disc T2 hyperintensity, as a normal finding seen in young patients.  No definite paraspinal or epidural inflammation at those levels.  No suspicious marrow signal elsewhere; intrinsic T1 and T2 hyperintensity at T6 is a benign vertebral body hemangioma.  However, there are layering pleural effusions left greater than right. Negative visualized upper abdominal viscera.  Epidural lipomatosis throughout the thoracic spine and effaces CSF from the thecal sac from the T5 to the T9 level. No definite spinal cord signal abnormality.  MR LUMBAR SPINE FINDINGS  In the lumbar spine T1-T2 and stir marrow signal appears diminished. This is also true in the visible pelvis. Possible associated decreased T2 signal in both the liver and spleen.  Otherwise negative visualized abdominal viscera.  There is mildly increased STIR signal in the lower lumbar erector spinae muscles bilaterally, at L4-L5 (series 17, images 9 and 1). Subcutaneous edema incidentally noted.  Normal lumbar vertebral height and alignment and no marrow edema or acute osseous abnormality  in the lumbar spine.  No definite signal abnormality in the lower thoracic spinal cord, conus at L1-L2.  Lumbar facet hypertrophy.  No lumbar spinal stenosis.  IMPRESSION: MR THORACIC SPINE IMPRESSION  1. Abnormal marrow edema in the T3 and T4 vertebral bodies highly suspicious for acute osteomyelitis in this setting. No associated epidural or paraspinal inflammation identified. 2. No other acute findings in the thoracic spine. Epidural lipomatosis. 3. Left greater than right layering pleural effusions.  MR LUMBAR SPINE IMPRESSION  1. Mild increased signal in the L4-L5 level erector spinae muscles bilaterally, which could be infectious myositis but also this appearance can be seen without infection in chronically debilitated patients. 2. No other acute finding in the lumbar spine. 3. Decreased bone marrow signal suggestive of red marrow conversion versus hemosiderosis.  Electronically Signed: By: Augusto [name] M.D. On: 01/15/2015 14:20   Mr Lumbar Spine Wo Contrast  01/15/2015   ADDENDUM REPORT: 01/15/2015 14:40  ADDENDUM: Study discussed by telephone with Dr. Paulette Blanch DAM on 01/15/2015 at 1435 hrs.   Electronically Signed   By: Augusto [name] M.D.   On: 01/15/2015 14:40   01/15/2015   CLINICAL DATA:  35 year old male with sepsis and back pain. Into card itis, purulent pericarditis, multiple small embolic infarcts. Initial encounter.  EXAM: MRI THORACIC AND LUMBAR SPINE WITHOUT CONTRAST  TECHNIQUE: Multiplanar and multiecho pulse sequences of the thoracic and lumbar spine were obtained without intravenous contrast.  COMPARISON:  Chest radiographs 01/14/2015. CT Abdomen and Pelvis 05/18/2013.  FINDINGS: MR THORACIC SPINE FINDINGS  Limited sagittal imaging of the cervical spine is unremarkable.  Preserved thoracic vertebral height and alignment, preserved thoracic vertebral height and alignment.  Throughout much of the T3 and T4 vertebral bodies there is heterogeneous T2 signal as well as patchy pre STIR signal. The  STIR images were repeated, an the same patchy increased signal persisted at T3 and T4.  No definite signal abnormality in the intervening disc, or adjacent disc spaces; there is widespread intervertebral disc T2 hyperintensity, as a normal finding seen in young patients.  No definite paraspinal or epidural inflammation at those levels.  No suspicious marrow signal elsewhere; intrinsic T1 and T2 hyperintensity at T6 is a benign vertebral body hemangioma.  However, there are layering pleural effusions left greater than right. Negative visualized upper abdominal viscera.  Epidural lipomatosis throughout the thoracic spine and effaces CSF from  the thecal sac from the T5 to the T9 level. No definite spinal cord signal abnormality.  MR LUMBAR SPINE FINDINGS  In the lumbar spine T1-T2 and stir marrow signal appears diminished. This is also true in the visible pelvis. Possible associated decreased T2 signal in both the liver and spleen.  Otherwise negative visualized abdominal viscera.  There is mildly increased STIR signal in the lower lumbar erector spinae muscles bilaterally, at L4-L5 (series 17, images 9 and 1). Subcutaneous edema incidentally noted.  Normal lumbar vertebral height and alignment and no marrow edema or acute osseous abnormality in the lumbar spine.  No definite signal abnormality in the lower thoracic spinal cord, conus at L1-L2.  Lumbar facet hypertrophy.  No lumbar spinal stenosis.  IMPRESSION: MR THORACIC SPINE IMPRESSION  1. Abnormal marrow edema in the T3 and T4 vertebral bodies highly suspicious for acute osteomyelitis in this setting. No associated epidural or paraspinal inflammation identified. 2. No other acute findings in the thoracic spine. Epidural lipomatosis. 3. Left greater than right layering pleural effusions.  MR LUMBAR SPINE IMPRESSION  1. Mild increased signal in the L4-L5 level erector spinae muscles bilaterally, which could be infectious myositis but also this appearance can be seen  without infection in chronically debilitated patients. 2. No other acute finding in the lumbar spine. 3. Decreased bone marrow signal suggestive of red marrow conversion versus hemosiderosis.  Electronically Signed: By: Augusto [name] M.D. On: 01/15/2015 14:20   US Renal  01/12/2015   CLINICAL DATA:  Acute renal failure  EXAM: RENAL/URINARY TRACT ULTRASOUND COMPLETE  COMPARISON:  None.  FINDINGS: Right Kidney:  Length: 12.8 cm in length. Diffuse increased cortical echogenicity suspicious for medical renal disease. No hydronephrosis. No renal calculus.  Left Kidney:  Length: . 12.6 cm in length. Diffuse increased echogenicity suspicious for medical renal disease. No hydronephrosis. No diagnostic renal calculus.  Bladder:  Appears normal for degree of bladder distention.  IMPRESSION: Bilateral kidneys shows diffuse increased echogenicity. Medical renal disease cannot be excluded. No hydronephrosis or diagnostic renal calculus.   Electronically Signed   By: Natasha Mead M.D.   On: 01/12/2015 16:08   Mr Shoulder Right Wo Contrast  01/11/2015   CLINICAL DATA:  Right shoulder pain.  EXAM: MRI OF THE RIGHT SHOULDER WITHOUT CONTRAST  TECHNIQUE: Multiplanar, multisequence MR imaging of the shoulder was performed. No intravenous contrast was administered.  COMPARISON:  Radiographs dated 01/09/2015  FINDINGS: Rotator cuff:  Normal.  Muscles:  Normal.  Biceps long head: Properly located and intact. There is inflammation in the soft tissues around the bicipital tendon sheath with fluid in the bicipital tendon sheath.  Acromioclavicular Joint: Small degenerative cyst in the acromion. Otherwise normal AC joint. Type 1 acromion. No bursitis.  Glenohumeral Joint: There is a moderate glenohumeral joint effusion with extravasation of contrast from the subcoracoid recess of the joint into the adjacent soft tissues. Fluid distends the bicipital tendon sheath.  Labrum:  Intact.  Bones: No significant abnormalities. Tiny degenerative  cystic areas in the posterior aspect of the greater tuberosity of the proximal humerus.  IMPRESSION: 1. Moderate glenohumeral joint effusion with some extravasation of joint. Fluid distends the bicipital tendon sheath with inflammation/extravasated joint fluid around the bicipital tendon sheath. The possibly of synovitis should be considered. 2. Normal rotator cuff.   Electronically Signed   By: Geanie Cooley M.D.   On: 01/11/2015 11:21   Mr Tibia Fibula Right Wo Contrast  01/15/2015   CLINICAL DATA:  Diabetic patient with bacteremia  and right leg pain.  EXAM: MRI OF LOWER RIGHT EXTREMITY WITHOUT CONTRAST  TECHNIQUE: Multiplanar, multisequence MR imaging of the right lower leg was performed. No intravenous contrast was administered.  COMPARISON:  None.  FINDINGS: The axial and coronal sequences incidentally include the left lower leg. There is marked subcutaneous edema of both lower legs which appears slightly worse on the left. No focal subcutaneous fluid collection is identified. Mildly increased T2 signal is seen in the gastrocnemius, soleus and and tibialis posterior muscles bilaterally which appears symmetric. No intramuscular fluid collection is identified. No bone marrow signal abnormality to suggest osteomyelitis is seen. There is no muscle or tendon tear.  IMPRESSION: Intense bilateral lower leg subcutaneous edema appears worse on the left and could be due to dependent change or cellulitis.  Mildly increased T2 signal in the gastrocnemius, soleus and tibialis posterior muscles bilaterally could be due to myositis or early changes of denervation atrophy in this diabetic patient.  Negative for abscess or osteomyelitis.   Electronically Signed   By: Drusilla Kanner M.D.   On: 01/15/2015 08:28   Mr Foot Right Wo Contrast  01/11/2015   CLINICAL DATA:  Diabetic foot ulcers.  EXAM: MRI OF THE RIGHT FOREFOOT WITHOUT CONTRAST  TECHNIQUE: Multiplanar, multisequence MR imaging was performed. No intravenous  contrast was administered.  COMPARISON:  Radiographs dated 01/09/2011  FINDINGS: There are small erosions of the proximal phalanx at the IP joint of the great toe and there are small erosions at the first metatarsal phalangeal joint, both consistent with gout. There are no soft tissue abscesses. There is no osteomyelitis. There is nonspecific edema in the subcutaneous fat of the dorsum of the foot. Muscles and tendons and ligaments appear appear normal. No joint effusions.  IMPRESSION: Subcutaneous edema primarily on the dorsum of the foot. 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   Electronically Signed   By: Geanie Cooley M.D.   On: 01/11/2015 12:50   Mr Femur Left Wo Contrast  01/12/2015   CLINICAL DATA:  Diabetes. Peripheral neuropathy. Sepsis. Bilateral thigh pain. RIGHT thigh pain. Tenderness in the medial aspect of the thigh.  EXAM: MR OF THE LEFT FEMUR WITHOUT CONTRAST  TECHNIQUE: Multiplanar, multisequence MR imaging was performed. No intravenous contrast was administered.  COMPARISON:  None.  FINDINGS: Diffuse subcutaneous edema is present around the pelvis and in both thighs, most compatible with anasarca. This could also represent cellulitis in the appropriate clinical setting. Subcutaneous fluid is present in the lateral LEFT thigh, without a discrete abscess. There is myositis of the LEFT vastus lateralis with mild muscular edema av but no significant swelling. No fatty atrophy.  Bone marrow signal shows heterogenous marrow. This is a nonspecific finding most commonly associated with obesity, anemia, cigarette smoking or chronic disease. Negative for hip fracture or AVN. There is no evidence of osteomyelitis. No deep soft tissue abscesses are present.  Bilaterally in the groin, there are prominent lymph nodes which are probably reactive to lower extremity process.  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  IMPRESSION: 1. Diffuse  subcutaneous edema in the pelvis and thighs bilaterally, suggesting anasarca. 2. More prominent subcutaneous edema along the LEFT thigh with subcutaneous fluid superficial to the muscular fascia, likely representing cellulitis. No abscess. 3. Myositis of the proximal vastus lateralis, probably infectious. Reactive edema could produce this appearance as well. 4. Reactive inguinal adenopathy bilaterally. 5. Negative for osteomyelitis.   Electronically Signed   By:  Andreas Newport M.D.   On: 01/12/2015 14:33   Mr Foot Left Wo Contrast  01/11/2015   CLINICAL DATA:  Diabetic foot ulcers.  Foot pain.  Bony erosions.  EXAM: MRI OF THE LEFT FOREFOOT WITHOUT CONTRAST  TECHNIQUE: Multiplanar, multisequence MR imaging was performed. No intravenous contrast was administered.  COMPARISON:  Radiograph dated 01/09/2015  FINDINGS: There is slight edema throughout the medial cuneiform. There is artifactual increased signal from the metatarsal heads in from the proximal phalanx of the great toe. There are erosions at IP joint and first metatarsal phalangeal joint consistent with gout.  There are no soft tissue abscesses and there is no osteomyelitis. There is prominent edema in the subcutaneous fat of the dorsum of the foot.  IMPRESSION: No evidence of osteomyelitis or abscess. The edema in the medial cuneiform is most likely a stress reaction. 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   Electronically Signed   By: Geanie Cooley M.D.   On: 01/11/2015 12:15   Dg Chest Port 1 View  01/10/2015   CLINICAL DATA:  Fever and sweats.  EXAM: PORTABLE CHEST - 1 VIEW  COMPARISON:  None.  FINDINGS: Low lung volumes with vascular crowding and streaky subsegmental atelectasis. No obvious infiltrate or effusion. The heart is within normal limits in size given the AP portable technique. The bony structures are grossly normal.  IMPRESSION: Low lung volumes with vascular crowding and bibasilar atelectasis.    Electronically Signed   By: Loralie Champagne M.D.   On: 01/10/2015 11:27   Dg Foot Complete Left  01/09/2015   CLINICAL DATA:  35 year old male with recurrent bilateral foot pain.  EXAM: LEFT FOOT - COMPLETE 3+ VIEW  COMPARISON:  01/05/2014.  FINDINGS: Large periarticular erosions are noted adjacent to the first MTP joint, and at the first interphalangeal joint along the medial margin in both locations. These have erosions have sclerotic margins and overhanging edges, and there is adjacent soft tissue prominence, suggestive of gouty erosions. No adjacent soft tissue calcification is noted. No acute displaced fracture, subluxation or dislocation. Small heterotopic ossification adjacent to the plantar aspect of the calcaneus, likely ossification within the plantar fascia.  IMPRESSION: 1. Findings, as above, suggestive of underlying gout. Clinical correlation is recommended.   Electronically Signed   By: Trudie Reed M.D.   On: 01/09/2015 09:46   Dg Foot Complete Right  01/09/2015   CLINICAL DATA:  Recurring bilateral foot pain  EXAM: RIGHT FOOT COMPLETE - 3+ VIEW  COMPARISON:  None.  FINDINGS: No fracture or dislocation is seen.  Marginal erosion at the medial aspect of the 1st IP joint, involving the proximal phalanx.  The visualized soft tissues are unremarkable.  IMPRESSION: Marginal erosion at the medial aspect of the 1st IP joint. Correlate for inflammatory arthropathy such as gout.  Otherwise, no acute osseus abnormality is seen.   Electronically Signed   By: Charline Bills M.D.   On: 01/09/2015 09:44   Dg Fluoro Guide Ndl Plc/bx  01/14/2015   CLINICAL DATA:  Right shoulder pain and effusion.  Bacteremia.  EXAM: RIGHT SHOULDER ASPIRATION UNDER FLUOROSCOPY  FLUOROSCOPY TIME:  0 min 24 seconds  PROCEDURE: Overlying skin prepped with Betadine, draped in the usual sterile fashion, and infiltrated locally with buffered Lidocaine. Twenty gauge spinal needle was placed in the right glenohumeral joint  under direct fluoroscopic visualization.  2 cc of bloody cloudy fluid was aspirated from the joint. Mr. Gibler tolerated the procedure  well.  IMPRESSION: Technically successful right shoulder aspiration under fluoroscopy. Fluid sent for laboratory analysis.   Electronically Signed   By: Geanie Cooley M.D.   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    1. Elevated trop: in the setting of endocarditis, myocardial inflammation.  2. 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 Barry Dienes with CT surgery, discussed with Dr. Barry Dienes, would prefer the bacteremia to clear up first, however we are concerned about the degree his MR worsened in 2 days between TTE and TEE. May need earlier surgery his start to have HF symptom. Has shown to have septic emboli to the brain based on MRI of brain. ?if has emboli to kidney as well, however prefer not to have CTA of abdomen given recent decline in renal function  - Pericarditis, small effusion. Discussed. Need tooth extraction prior to valvular surgery - per Dr. Cornelius Moras, ideally need cardiac cath, LE angiography to r/o septic emboli to LLE arterial system, however understandably his renal function is prohibitive of such test now. He will need dental eval prior to surgery. Vascular consulted, no further intervention at this time. LE venous duplex negative for DVT  - if shoulder surgery is necessary, moderate cardiac risk (based on severe MR and underlying endocarditis) for shoulder debridement, and may proceed if benefit felt to outweigh risk . EF normal. At this time, ortho team  recommends med mgt. S/p aspiration.  - ?septic emboi to kidney. Nafcillin changed to penicillin by ID given renal function. Per ortho, no plan for intervention, R shoulder stable  - continue IV diuresis for now, Cr improved, still 3.7 L positive with 1-2+ pitting edema. Per Dr. Orvan July original consult, he likely need diagnostic cath at some point, however as long as he's stable, this can be deferred to minimize risk of contrast nephropathy.  3. staph bacteremia, MSSA- ID, penicillin. Now MRI of spine concerning for osteo.   - blood culture obtained yesterday again after WBC went up, now trended down again 4. PVD, LE ABI 0.64. Vascular evaluated. No DVT, Monitor.  5. Acute on chronic renal insufficiency: improving on IV fluids, slightly worsened after diuresis 6. Hypoalbuminemia 7. Hyponatremia    Signed, Azalee Course PA-C Pager: [phone]

## 2015-01-20 NOTE — Progress Notes (Signed)
Utilization review completed.  

## 2015-01-20 NOTE — Progress Notes (Signed)
[facility] PROGRESS NOTE  Dwayne HickmanBenjamin Berne ZOX:[id number] DOB: 08/06/1980 DOA: 01/09/2015 PCP: No PCP Per Patient    Brief narrative: Dwayne [name] is a 35 y.o. male with a history of Uncontrolled HTN, DM2 who presents to the ED with complaints of diarrhea and fevers and chills x 24 hours. He reports having weakness and fatigue and He denies any nausea or vomiting. No one is sick at home, and he denies taking any recent antibiotic therapy. He reports that he has been out of his medications for months, and has not seen his PCP in months as well. he was found to have MSSA bacteremia and came in septic shock. He underwent TEE showing MV endocarditis. CTVs consulted and plan for surgery in the next week. Dental medicine consulted and plan for extraction int he next few  Days as their schedule permits.   HPI/Subjective: Reports chest soreness.   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 Algis LimingVanDam recommends 8 weeks of antibiotics post heart surgery and all other potential surgeries - seen by Dr. Barry Dieneswens with CT surgery-- He would prefer the bacteremia to clear up first, however, he is concerned about the degree his MR worsened in 2 days between TTE and TEE. May need earlier surgery his start to have HF symptom. - per Dr. Cornelius Moraswen, ideally need cardiac cath- Cr clearance prohibitive of this at this time. Planning for cardiac cath some time next week when creatinine improves.  - cardiology also following and assisting with management- on IV lasix for diuresis.  - WBC up today - management per ID  Dental caries - dental medicine consulted for extraction prior to valve replacement  HTN - BP was better controlled - increased Lopressor to 50 bid and added oral hydralazine- cont to follow at current  doses  Poorly controlled diabetes - appears to have a poor understand about the need to control oral glucose/carb intake- I have spoken with him the dietary restrictions he needs to follow Hemoglobin A1c 8.1 Patient admits to being noncompliant with medications Increased 70/30 to achieve better control- now sugars improved As he is on 70/30, he should technically need a sliding scale at lunch only rather than TID - have modified this   Acute renal failure Improving.  may be related to sepsis and diuresis-. Continue to monitor.   Right shoulder- septic arthritis/ synovitis  - s/p aspiration by IR -ID team recommended I and D to resolve infection -  medical management recommended by ortho for now as taking him to the OR with his currently infected valve would be too high risk  Acute osteomyelitis of T3T4 - noted on MRI to have increased signal which may be osteomyelitis  Myositis L4-L5 area and entire left leg  - noted on MRI  Septic emboli to the brain   Diarrhea C. difficile negative  Anemia: Normocytic and appears to be at baseline and stable.    Hypokalemia:  repleted as needed.    Code Status: full Family Communication: none at bedside Disposition Plan:  As above   Consultants:  Cardiothoracic surgery  Dental medicine  Infectious disease  cardiology  Procedures:  None  Antibiotics: Anti-infectives    Start     Dose/Rate Route Frequency Ordered Stop   01/19/15 2000  ampicillin (OMNIPEN) 2 g in sodium chloride 0.9 % 50 mL IVPB     2  g150 mL/hr over 20 Minutes Intravenous 6 times per day 01/19/15 1121     01/18/15 2000  penicillin G potassium 12 Million Units in dextrose 5 % 500 mL continuous infusion     12 Million Units41.7 mL/hr over 12 Hours Intravenous Every 12 hours 01/18/15 1432 01/19/15 2050   01/15/15 1600  nafcillin 2 g in dextrose 5 % 50 mL IVPB  Status:  Discontinued     2 g100 mL/hr over 30 Minutes Intravenous 6 times per day  01/15/15 1503 01/15/15 1517   01/15/15 1600  nafcillin 2 g in dextrose 5 % 50 mL IVPB     2 g100 mL/hr over 30 Minutes Intravenous 6 times per day 01/15/15 1517 01/18/15 1753   01/15/15 0930  nafcillin injection 2 g  Status:  Discontinued     2 g Intravenous Every 4 hours 01/15/15 0921 01/15/15 1503   01/14/15 1500  DAPTOmycin (CUBICIN) 600 mg in sodium chloride 0.9 % IVPB  Status:  Discontinued     600 mg224 mL/hr over 30 Minutes Intravenous Every 48 hours 01/12/15 1421 01/13/15 1239   01/13/15 1500  ceFAZolin (ANCEF) IVPB 2 g/50 mL premix  Status:  Discontinued     2 g100 mL/hr over 30 Minutes Intravenous 3 times per day 01/13/15 1407 01/15/15 0921   01/12/15 1600  ceFAZolin (ANCEF) IVPB 2 g/50 mL premix  Status:  Discontinued     2 g100 mL/hr over 30 Minutes Intravenous Every 12 hours 01/12/15 1419 01/13/15 1407   01/11/15 1200  DAPTOmycin (CUBICIN) 600 mg in sodium chloride 0.9 % IVPB  Status:  Discontinued     600 mg224 mL/hr over 30 Minutes Intravenous Every 24 hours 01/11/15 1003 01/12/15 1421   01/10/15 1200  vancomycin (VANCOCIN) IVPB 1000 mg/200 mL premix  Status:  Discontinued     1,000 mg200 mL/hr over 60 Minutes Intravenous Every 8 hours 01/10/15 1044 01/11/15 0947   01/10/15 1100  piperacillin-tazobactam (ZOSYN) IVPB 3.375 g  Status:  Discontinued     3.375 g12.5 mL/hr over 240 Minutes Intravenous Every 8 hours 01/10/15 1043 01/11/15 0902   01/09/15 0700  metroNIDAZOLE (FLAGYL) IVPB 500 mg  Status:  Discontinued     500 mg100 mL/hr over 60 Minutes Intravenous Every 8 hours 01/09/15 0651 01/10/15 1018       Objective: Filed Vitals:   01/19/15 0418 01/19/15 1409 01/19/15 2015 01/20/15 0359  BP: 127/59 131/80 146/75 117/64  Pulse: 88 77 85 76  Temp: 99.2 F (37.3 C) 98.9 F (37.2 C) 99.1 F (37.3 C) 98.7 F (37.1 C)  TempSrc: Oral Oral Oral Oral  Resp: Height:      Weight: 106.641 kg (235 lb 1.6 oz)   106.187 kg (234 lb 1.6 oz)  SpO2: 97% 100% 100% 100%     Intake/Output Summary (Last 24 hours) at 01/20/15 1115 Last data filed at 01/20/15 1059  Gross per 24 hour  Intake    600 ml  Output   3050 ml  Net  -2450 ml    Exam:  General: alert  Not in any distress.  Cardiovascular: RRR, nl S1 s2 + murmur 2+ at right sternal border. Respiratory: Decreased breath sounds at the bases, scattered rhonchi, no crackles  Abdomen: soft +BS NT/ND, no masses palpable  Extremities: No cyanosis and no edema      Data Reviewed: Basic Metabolic Panel:  Recent Labs Lab 01/16/15 0509 01/17/15 0535 01/18/15 0357 01/19/15 0321 01/20/15 0433  NA  135 132* 135 137 135  K 3.6 3.6 3.3* 3.4* 4.1  CL 104 105 104 104 97  CO2 GLUCOSE 294* 228* 67* 116* 184*  BUN CREATININE 1.99* 1.86* 2.13* 2.20* 1.96*  CALCIUM 7.5* 7.6* 8.1* 7.9* 7.9*    Liver Function Tests:  Recent Labs Lab 01/15/15 1540 01/16/15 0509 01/17/15 0535 01/18/15 0357 01/19/15 0321  AST 40* 42* ALT ALKPHOS 263* 233* 209* 202* 185*  BILITOT 0.3 0.9 1.2 1.6* 0.9  PROT 6.0 5.5* 6.0 6.5 5.8*  ALBUMIN 1.3* 1.2* 1.2* 1.3* 1.2*   No results for input(s): LIPASE, AMYLASE in the last 168 hours. No results for input(s): AMMONIA in the last 168 hours.  CBC:  Recent Labs Lab 01/16/15 0509 01/17/15 0535 01/18/15 0357 01/19/15 0321 01/20/15 0433  WBC 12.2* 13.7* 20.2* 13.7* 14.3*  HGB 9.1* 9.5* 9.8* 8.9* 9.3*  HCT 27.0* 28.7* 29.6* 27.0* 28.6*  MCV 83.6 86.2 86.0 86.3 88.3  PLT 372 387 506* 434* 467*    Cardiac Enzymes:  Recent Labs Lab 01/19/15 0321  CKTOTAL 152   BNP (last 3 results) No results for input(s): PROBNP in the last 8760 hours.   CBG:  Recent Labs Lab 01/19/15 0609 01/19/15 1054 01/19/15 1703 01/19/15 2047 01/20/15 0611  GLUCAP 94 227* 185* 211* 160*    Imaging results reviewed in detail.    Scheduled Meds: . ampicillin (OMNIPEN) IV  2 g Intravenous 6 times per day  . bisacodyl   10 mg Rectal Once  . chlorhexidine  15 mL Mouth/Throat BID  . feeding supplement (PRO-STAT SUGAR FREE 64)  30 mL Oral TID WC  . furosemide  40 mg Intravenous BID  . hydrALAZINE  25 mg Oral 3 times per day  . Influenza vac split quadrivalent PF  0.5 mL Intramuscular Tomorrow-1000  . insulin aspart  0-15 Units Subcutaneous QAC lunch  . insulin aspart  0-5 Units Subcutaneous QHS  . insulin aspart protamine- aspart  28 Units Subcutaneous BID WC  . living well with diabetes book   Does not apply Once  . metoprolol tartrate  50 mg Oral BID  . neomycin-bacitracin-polymyxin   Topical Daily  . nicotine  7 mg Transdermal Daily  . sodium chloride  500 mL Intravenous Once   Continuous Infusions:    Time spent: 30 minutes   Shenelle Klas  [facility] [phone] Pager-  [email or web address], password [facility] 01/20/2015, 11:15 AM  LOS: 11 days

## 2015-01-21 DIAGNOSIS — D72829 Elevated white blood cell count, unspecified: Secondary | ICD-10-CM

## 2015-01-21 DIAGNOSIS — I340 Nonrheumatic mitral (valve) insufficiency: Secondary | ICD-10-CM

## 2015-01-21 DIAGNOSIS — N189 Chronic kidney disease, unspecified: Secondary | ICD-10-CM

## 2015-01-21 DIAGNOSIS — I729 Aneurysm of unspecified site: Secondary | ICD-10-CM

## 2015-01-21 LAB — BASIC METABOLIC PANEL
Anion gap: 11 (ref 5–15)
BUN: 19 mg/dL (ref 6–23)
CO2: 28 mmol/L (ref 19–32)
Calcium: 8 mg/dL — ABNORMAL LOW (ref 8.4–10.5)
Chloride: 95 mEq/L — ABNORMAL LOW (ref 96–112)
Creatinine, Ser: 2.12 mg/dL — ABNORMAL HIGH (ref 0.50–1.35)
GFR calc Af Amer: 45 mL/min — ABNORMAL LOW (ref >90)
GFR calc non Af Amer: 39 mL/min — ABNORMAL LOW (ref >90)
Glucose, Bld: 131 mg/dL — ABNORMAL HIGH (ref 70–99)
POTASSIUM: 3.9 mmol/L (ref 3.5–5.1)
Sodium: 134 mmol/L — ABNORMAL LOW (ref 135–145)

## 2015-01-21 LAB — GLUCOSE, CAPILLARY
GLUCOSE-CAPILLARY: 83 mg/dL (ref 70–99)
Glucose-Capillary: 141 mg/dL — ABNORMAL HIGH (ref 70–99)
Glucose-Capillary: 131 mg/dL — ABNORMAL HIGH (ref 70–99)
Glucose-Capillary: 171 mg/dL — ABNORMAL HIGH (ref 70–99)

## 2015-01-21 LAB — CBC
HCT: 27.2 % — ABNORMAL LOW (ref 39.0–52.0)
Hemoglobin: 8.7 g/dL — ABNORMAL LOW (ref 13.0–17.0)
MCH: 28.5 pg (ref 26.0–34.0)
MCHC: 32 g/dL (ref 30.0–36.0)
MCV: 89.2 fL (ref 78.0–100.0)
Platelets: 458 10*3/uL — ABNORMAL HIGH (ref 150–400)
RBC: 3.05 MIL/uL — ABNORMAL LOW (ref 4.22–5.81)
RDW: 14.1 % (ref 11.5–15.5)
WBC: 15.4 10*3/uL — ABNORMAL HIGH (ref 4.0–10.5)

## 2015-01-21 MED ORDER — FUROSEMIDE 40 MG PO TABS
40.0000 mg | ORAL_TABLET | Freq: Every day | ORAL | Status: DC
  Administered 2015-01-22: 40 mg via ORAL
  Filled 2015-01-21 (×2): qty 1

## 2015-01-21 NOTE — Progress Notes (Signed)
Continues to diurese - creatinine is starting to rise. Sodium and chloride are mildly decreased. Likely close to diuretic endpoint. Would switch to po diuretics for maintenance. Cardiology will follow peripherally over the weekend - could do [facility] on Monday. Possible edentulation next week after than and surgical plans after several weeks of antibiotics as outlined by Dr. Cornelius Moraswen.  Chrystie NoseKenneth C. Hilty, MD, University Hospitals Of ClevelandFACC Attending Cardiologist [facility]

## 2015-01-21 NOTE — Progress Notes (Signed)
[facility]  Day #13 abtx, Day #3 ampicillin  Subjective:  afebrile,  right shoulder pain still present with decrease range of motion in comparison to left  24hr events: leukocytosis still remains at 15K,    Antibiotics:  Anti-infectives    Start     Dose/Rate Route Frequency Ordered Stop   01/19/15 2000  ampicillin (OMNIPEN) 2 g in sodium chloride 0.9 % 50 mL IVPB     2 g150 mL/hr over 20 Minutes Intravenous 6 times per day 01/19/15 1121     01/18/15 2000  penicillin G potassium 12 Million Units in dextrose 5 % 500 mL continuous infusion     12 Million Units41.7 mL/hr over 12 Hours Intravenous Every 12 hours 01/18/15 1432 01/19/15 2050   01/15/15 1600  nafcillin 2 g in dextrose 5 % 50 mL IVPB  Status:  Discontinued     2 g100 mL/hr over 30 Minutes Intravenous 6 times per day 01/15/15 1503 01/15/15 1517   01/15/15 1600  nafcillin 2 g in dextrose 5 % 50 mL IVPB     2 g100 mL/hr over 30 Minutes Intravenous 6 times per day 01/15/15 1517 01/18/15 1753   01/15/15 0930  nafcillin injection 2 g  Status:  Discontinued     2 g Intravenous Every 4 hours 01/15/15 0921 01/15/15 1503   01/14/15 1500  DAPTOmycin (CUBICIN) 600 mg in sodium chloride 0.9 % IVPB  Status:  Discontinued     600 mg224 mL/hr over 30 Minutes Intravenous Every 48 hours 01/12/15 1421 01/13/15 1239   01/13/15 1500  ceFAZolin (ANCEF) IVPB 2 g/50 mL premix  Status:  Discontinued     2 g100 mL/hr over 30 Minutes Intravenous 3 times per day 01/13/15 1407 01/15/15 0921   01/12/15 1600  ceFAZolin (ANCEF) IVPB 2 g/50 mL premix  Status:  Discontinued     2 g100 mL/hr over 30 Minutes Intravenous Every 12 hours 01/12/15 1419 01/13/15 1407   01/11/15 1200  DAPTOmycin (CUBICIN) 600 mg in sodium chloride 0.9 % IVPB  Status:  Discontinued     600 mg224 mL/hr over 30 Minutes Intravenous Every 24 hours 01/11/15 1003 01/12/15 1421   01/10/15 1200  vancomycin (VANCOCIN) IVPB 1000 mg/200 mL premix  Status:   Discontinued     1,000 mg200 mL/hr over 60 Minutes Intravenous Every 8 hours 01/10/15 1044 01/11/15 0947   01/10/15 1100  piperacillin-tazobactam (ZOSYN) IVPB 3.375 g  Status:  Discontinued     3.375 g12.5 mL/hr over 240 Minutes Intravenous Every 8 hours 01/10/15 1043 01/11/15 0902   01/09/15 0700  metroNIDAZOLE (FLAGYL) IVPB 500 mg  Status:  Discontinued     500 mg100 mL/hr over 60 Minutes Intravenous Every 8 hours 01/09/15 0651 01/10/15 1018      Medications: Scheduled Meds: . ampicillin (OMNIPEN) IV  2 g Intravenous 6 times per day  . bisacodyl  10 mg Rectal Once  . chlorhexidine  15 mL Mouth/Throat BID  . feeding supplement (PRO-STAT SUGAR FREE 64)  30 mL Oral TID WC  . furosemide  40 mg Intravenous BID  . hydrALAZINE  25 mg Oral 3 times per day  . Influenza vac split quadrivalent PF  0.5 mL Intramuscular Tomorrow-1000  . insulin aspart  0-15 Units Subcutaneous QAC lunch  . insulin aspart  0-5 Units Subcutaneous QHS  . insulin aspart protamine- aspart  28 Units Subcutaneous BID WC  . living well with diabetes book   Does not apply Once  .  metoprolol tartrate  50 mg Oral BID  . neomycin-bacitracin-polymyxin   Topical Daily  . nicotine  7 mg Transdermal Daily  . sodium chloride  500 mL Intravenous Once    Objective: Weight change: 1 lb 6.4 oz (0.635 kg)  Intake/Output Summary (Last 24 hours) at 01/21/15 1116 Last data filed at 01/21/15 0915  Gross per 24 hour  Intake    720 ml  Output   1800 ml  Net  -1080 ml   Blood pressure 126/63, pulse 81, temperature 98.4 F (36.9 C), temperature source Oral, resp. rate 19, height 6' 2" (1.88 m), weight 235 lb 8 oz (106.822 kg), SpO2 99 %. Temp:  [98.4 F (36.9 C)-98.6 F (37 C)] 98.4 F (36.9 C) (01/22 0452) Pulse Rate:  [77-81] 81 (01/22 0452) Resp:  [19-21] 19 (01/22 0452) BP: (126-137)/(63-75) 126/63 mmHg (01/22 0452) SpO2:  [99 %-100 %] 99 % (01/22 0452) Weight:  [235 lb 8 oz (094.179 kg)] 235 lb 8 oz (106.822 kg) (01/22  0500)  Physical Exam: BP 126/63 mmHg  Pulse 81  Temp(Src) 98.4 F (36.9 C) (Oral)  Resp 19  Ht 6' 2" (1.88 m)  Wt 235 lb 8 oz (106.822 kg)  BMI 30.22 kg/m2  SpO2 99%  Physical Exam  Constitutional: He is oriented to person, place, and time. He appears well-developed and well-nourished. No distress.  HENT:  Mouth/Throat: Oropharynx is clear and moist. No oropharyngeal exudate.  Cardiovascular: Normal rate, regular rhythm and normal heart sounds. Nl s1, s2, 1/2 HSM at apex Pulmonary/Chest: Effort normal and breath sounds normal. No respiratory distress. He has no wheezes.  Abdominal: Soft. Bowel sounds are normal. He exhibits no distension. There is no tenderness.  Lymphadenopathy:  He has no cervical adenopathy.  Skin: Skin is warm and dry. No rash noted. No erythema.  Ext: right shoulder decrease range of motion than left shoulder.  left ankle > right 2/2 hx of surgery. +1 edema lower extremities L > R   CBC:   CBC Latest Ref Rng 01/21/2015 01/20/2015 01/19/2015  WBC 4.0 - 10.5 K/uL 15.4(H) 14.3(H) 13.7(H)  Hemoglobin 13.0 - 17.0 g/dL 8.7(L) 9.3(L) 8.9(L)  Hematocrit 39.0 - 52.0 % 27.2(L) 28.6(L) 27.0(L)  Platelets 150 - 400 K/uL 458(H) 467(H) 434(H)     BMET  Recent Labs  01/20/15 0433 01/21/15 0418  NA 135 134*  K 4.1 3.9  CL 97 95*  CO2 30 28  GLUCOSE 184* 131*  BUN 17 19  CREATININE 1.96* 2.12*  CALCIUM 7.9* 8.0*     Liver Panel   Recent Labs  01/19/15 0321  PROT 5.8*  ALBUMIN 1.2*  AST 17  ALT 13  ALKPHOS 185*  BILITOT 0.9    Micro Results: Recent Results (from the past 720 hour(s))  Urine culture     Status: None   Collection Time: 01/09/15  1:31 AM  Result Value Ref Range Status   Specimen Description URINE, CLEAN CATCH  Final   Special Requests NONE  Final   Colony Count   Final    3,000 COLONIES/ML Performed at [facility]    Culture   Final    INSIGNIFICANT GROWTH Performed at [facility]    Report Status  01/10/2015 FINAL  Final  Stool culture     Status: None   Collection Time: 01/09/15  4:09 AM  Result Value Ref Range Status   Specimen Description VAGINAL/RECTAL  Final   Special Requests Normal  Final   Culture  Final    NO SALMONELLA, SHIGELLA, CAMPYLOBACTER, YERSINIA, OR E.COLI 0157:H7 ISOLATED Performed at [facility]    Report Status 01/12/2015 FINAL  Final  Clostridium Difficile by PCR     Status: None   Collection Time: 01/09/15  4:09 AM  Result Value Ref Range Status   C difficile by pcr NEGATIVE NEGATIVE Final  Culture, blood (routine x 2)     Status: None   Collection Time: 01/10/15  4:35 AM  Result Value Ref Range Status   Specimen Description BLOOD RIGHT HAND  Final   Special Requests BOTTLES DRAWN AEROBIC AND ANAEROBIC 10CC EA  Final   Culture   Final    STAPHYLOCOCCUS AUREUS Note: SUSCEPTIBILITIES PERFORMED ON PREVIOUS CULTURE WITHIN THE LAST 5 DAYS. Note: Culture results may be compromised due to an excessive volume of blood received in culture bottles. Gram Stain Report Called to,Read Back By and Verified With:  MONICA YIM ON 01/10/2015 AT 11:36P BY WILEJ Performed at [facility]    Report Status 01/13/2015 FINAL  Final  Culture, blood (routine x 2)     Status: None   Collection Time: 01/10/15  4:46 AM  Result Value Ref Range Status   Specimen Description BLOOD LEFT ARM  Final   Special Requests BOTTLES DRAWN AEROBIC AND ANAEROBIC 10CC EA  Final   Culture   Final    STAPHYLOCOCCUS AUREUS Note: SUSCEPTIBILITIES PERFORMED ON PREVIOUS CULTURE WITHIN THE LAST 5 DAYS. Note: Culture results may be compromised due to an excessive volume of blood received in culture bottles. Gram Stain Report Called to,Read Back By and Verified With: MONICA YIM ON 01/10/2015 AT 11:36P BY WILEJ Performed at [facility]    Report Status 01/13/2015 FINAL  Final  Respiratory virus panel (routine influenza)     Status: None   Collection Time: 01/10/15 10:34 AM    Result Value Ref Range Status   Source - RVPAN NASAL SWAB  Corrected    Comment: CORRECTED ON 01/12 AT 2036: PREVIOUSLY REPORTED AS NASAL SWAB   Respiratory Syncytial Virus A NOT DETECTED  Final   Respiratory Syncytial Virus B NOT DETECTED  Final   Influenza A NOT DETECTED  Final   Influenza B NOT DETECTED  Final   Parainfluenza 1 NOT DETECTED  Final   Parainfluenza 2 NOT DETECTED  Final   Parainfluenza 3 NOT DETECTED  Final   Metapneumovirus NOT DETECTED  Final   Rhinovirus NOT DETECTED  Final   Adenovirus NOT DETECTED  Final   Influenza A H1 NOT DETECTED  Final   Influenza A H3 NOT DETECTED  Final    Comment: (NOTE)       Normal Reference Range for each Analyte: NOT DETECTED Testing performed using the Luminex xTAG Respiratory Viral Panel test kit. The analytical performance characteristics of this assay have been determined by [facility].  The modifications have not been cleared or approved by the FDA. This assay has been validated pursuant to the CLIA regulations and is used for clinical purposes. Performed at [facility], blood (routine x 2)     Status: None   Collection Time: 01/10/15 11:19 AM  Result Value Ref Range Status   Specimen Description BLOOD LEFT FOREARM  Final   Special Requests BOTTLES DRAWN AEROBIC ONLY 5CC  Final   Culture   Final    STAPHYLOCOCCUS AUREUS Note: RIFAMPIN AND GENTAMICIN SHOULD NOT BE USED AS SINGLE DRUGS FOR TREATMENT OF STAPH INFECTIONS. Note:  Gram Stain Report Called to,Read Back By and Verified With: NURSE MONICA YIM 01/11/15 6:05AM THOMI Performed at [facility]    Report Status 01/13/2015 FINAL  Final   Organism ID, Bacteria STAPHYLOCOCCUS AUREUS  Final      Susceptibility   Staphylococcus aureus - MIC*    CLINDAMYCIN <=0.25 SENSITIVE Sensitive     ERYTHROMYCIN <=0.25 SENSITIVE Sensitive     GENTAMICIN <=0.5 SENSITIVE Sensitive     LEVOFLOXACIN 4 INTERMEDIATE Intermediate     OXACILLIN 0.5  SENSITIVE Sensitive     PENICILLIN 0.12 SENSITIVE Sensitive     RIFAMPIN 2 INTERMEDIATE Intermediate     TRIMETH/SULFA <=10 SENSITIVE Sensitive     VANCOMYCIN 1 SENSITIVE Sensitive     TETRACYCLINE <=1 SENSITIVE Sensitive     MOXIFLOXACIN 2 RESISTANT Resistant     * STAPHYLOCOCCUS AUREUS  Culture, blood (routine x 2)     Status: None   Collection Time: 01/10/15 11:27 AM  Result Value Ref Range Status   Specimen Description BLOOD LEFT ANTECUBITAL  Final   Special Requests BOTTLES DRAWN AEROBIC ONLY [place]  Final   Culture   Final    STAPHYLOCOCCUS AUREUS Note: SUSCEPTIBILITIES PERFORMED ON PREVIOUS CULTURE WITHIN THE LAST 5 DAYS. Note: Gram Stain Report Called to,Read Back By and Verified With: NURSE MONICA YIM 01/11/15 6:05AM THOMI Performed at [facility]    Report Status 01/13/2015 FINAL  Final  Culture, blood (routine x 2)     Status: None   Collection Time: 01/13/15  7:20 PM  Result Value Ref Range Status   Specimen Description BLOOD RIGHT ARM  Final   Special Requests BOTTLES DRAWN AEROBIC AND ANAEROBIC 5CC  Final   Culture   Final    NO GROWTH 5 DAYS Performed at [facility]    Report Status 01/20/2015 FINAL  Final  Culture, blood (routine x 2)     Status: None   Collection Time: 01/13/15  7:20 PM  Result Value Ref Range Status   Specimen Description BLOOD RIGHT ARM  Final   Special Requests BOTTLES DRAWN AEROBIC AND ANAEROBIC 3CC  Final   Culture   Final    NO GROWTH 5 DAYS Performed at [facility]    Report Status 01/20/2015 FINAL  Final  Body fluid culture     Status: None   Collection Time: 01/14/15  9:02 AM  Result Value Ref Range Status   Specimen Description FLUID RIGHT SHOULDER SYNOVIAL  Final   Special Requests NONE  Final   Gram Stain   Final    FEW WBC PRESENT,BOTH PMN AND MONONUCLEAR NO ORGANISMS SEEN Performed at [facility]    Culture   Final    NO GROWTH 3 DAYS Performed at [facility]    Report Status  01/17/2015 FINAL  Final  Culture, blood (routine x 2)     Status: None (Preliminary result)   Collection Time: 01/18/15  6:10 PM  Result Value Ref Range Status   Specimen Description BLOOD LEFT ARM  Final   Special Requests BOTTLES DRAWN AEROBIC AND ANAEROBIC 10CC  Final   Culture   Final           BLOOD CULTURE RECEIVED NO GROWTH TO DATE CULTURE WILL BE HELD FOR 5 DAYS BEFORE ISSUING A FINAL NEGATIVE REPORT Performed at [facility]    Report Status PENDING  Incomplete  Culture, blood (routine x 2)     Status: None (Preliminary result)  Collection Time: 01/18/15  6:18 PM  Result Value Ref Range Status   Specimen Description BLOOD RIGHT ARM  Final   Special Requests BOTTLES DRAWN AEROBIC AND ANAEROBIC 10CC  Final   Culture   Final           BLOOD CULTURE RECEIVED NO GROWTH TO DATE CULTURE WILL BE HELD FOR 5 DAYS BEFORE ISSUING A FINAL NEGATIVE REPORT Performed at [facility]    Report Status PENDING  Incomplete     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 Problems:   Tobacco abuse   Noncompliance   Dehydration   Diarrhea   Diabetes type 2, uncontrolled   Hyperglycemia   Uncontrolled hypertension   Diabetic foot ulcers   Pain   Diabetic ulcer of left foot associated with type 2 diabetes mellitus   Diabetic ulcer of right foot associated with type 2 diabetes mellitus   Osteomyelitis   Shoulder pain   Thigh pain, musculoskeletal   Septic shock   Staphylococcus aureus bacteremia with sepsis   Smoker   Elevated troponin   Acute renal failure   Bacteremia   Septic joint of right shoulder region   Infective myositis of left thigh   Purulent pericarditis   Mitral regurgitation   Endocarditis   Back pain   Poor dentition   Discitis of thoracic region   Congestive heart failure   Leukocytosis    Dwayne [name] is a 35 y.o. 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  MSSA endocarditis/bacteremia=  -bacteremia cleared on 1/14, currently on ampicilin (since PCN sensitive), initially had on nafcillin but changed due to aki. Ampicillin would also have good penetrations into cns. Will need 6 wk of IV antibiotics. Likely restart the abtx clock at time of surgery as well. Would send tissue to pcr testing.  Right shoulder septic arthritis = after heart cath and dental extraction next week, would approach ortho to reassess if we need to do I x D. Synovial aspirate clearly shows that it is c/w septic arthritis. cx were negative since he had been on antibiotics at that time. Would he been fit from arthroscopic wash out?  - leukocytosis = is worrisome, since it is not trending down despite on appropriate antibiotics, worrisome for large burden of disease  - poor dentition = agree that he should undergo extraction before proceeding to valve surgery  -acute on chronic kidney disease = still not improving much in the last 5 days. currently getting diuresed to help with fluid status   LOS: 12 days   Dwayne [name] 01/21/2015, 11:16 AM

## 2015-01-21 NOTE — Progress Notes (Signed)
Patient refuses pro-stat feeding supplement. "Nasty stuff" as per patient.

## 2015-01-21 NOTE — Progress Notes (Signed)
Subjective: He reports chest and back pain when he lies down. It improves when sitting up.    Objective: Vital signs in last 24 hours: Temp:  [98.4 F (36.9 C)-98.6 F (37 C)] 98.4 F (36.9 C) (01/22 0452) Pulse Rate:  [77-81] 81 (01/22 0452) Resp:  [19-21] 19 (01/22 0452) BP: (126-137)/(63-75) 126/63 mmHg (01/22 0452) SpO2:  [99 %-100 %] 99 % (01/22 0452) Weight:  [235 lb 8 oz (811.914(106.822 kg)] 235 lb 8 oz (106.822 kg) (01/22 0500) Last BM Date: 01/17/15  Intake/Output from previous day: 01/21 0701 - 01/22 0700 In: 720 [P.O.:720] Out: 2000 [Urine:2000] Intake/Output this shift: Total I/O In: 240 [P.O.:240] Out: 400 [Urine:400]  Medications Current Facility-Administered Medications  Medication Dose Route Frequency Provider Last Rate Last Dose  . acetaminophen (TYLENOL) tablet 650 mg  650 mg Oral Q6H PRN Ron ParkerHarvette C Jenkins, MD   650 mg at 01/19/15 0844   Or  . acetaminophen (TYLENOL) suppository 650 mg  650 mg Rectal Q6H PRN Ron ParkerHarvette C Jenkins, MD      . alum & mag hydroxide-simeth (MAALOX/MYLANTA) 200-200-20 MG/5ML suspension 30 mL  30 mL Oral Q6H PRN Ron ParkerHarvette C Jenkins, MD      . ampicillin (OMNIPEN) 2 g in sodium chloride 0.9 % 50 mL IVPB  2 g Intravenous 6 times per day Judyann Munsonynthia Snider, MD   2 g at 01/21/15 1102  . bisacodyl (DULCOLAX) suppository 10 mg  10 mg Rectal Once Calvert CantorSaima Rizwan, MD   10 mg at 01/17/15 2148  . chlorhexidine (PERIDEX) 0.12 % solution 15 mL  15 mL Mouth/Throat BID Purcell Nailslarence H Owen, MD   15 mL at 01/21/15 1100  . feeding supplement (PRO-STAT SUGAR FREE 64) liquid 30 mL  30 mL Oral TID WC Jenifer A Williams, RD   30 mL at 01/20/15 0602  . furosemide (LASIX) injection 40 mg  40 mg Intravenous BID Chrystie NoseKenneth C. Hilty, MD   40 mg at 01/21/15 0900  . hydrALAZINE (APRESOLINE) injection 10 mg  10 mg Intravenous Q6H PRN Ron ParkerHarvette C Jenkins, MD   10 mg at 01/10/15 0654  . hydrALAZINE (APRESOLINE) tablet 25 mg  25 mg Oral 3 times per day Calvert CantorSaima Rizwan, MD   25 mg at  01/21/15 0538  . HYDROmorphone (DILAUDID) injection 1 mg  1 mg Intravenous Q3H PRN Ripudeep K Rai, MD   1 mg at 01/21/15 0001  . Influenza vac split quadrivalent PF (FLUARIX) injection 0.5 mL  0.5 mL Intramuscular Tomorrow-1000 Ripudeep K Rai, MD   0.5 mL at 01/10/15 1000  . insulin aspart (novoLOG) injection 0-15 Units  0-15 Units Subcutaneous QAC lunch Calvert CantorSaima Rizwan, MD   5 Units at 01/19/15 1243  . insulin aspart (novoLOG) injection 0-5 Units  0-5 Units Subcutaneous QHS Ripudeep Jenna LuoK Rai, MD   2 Units at 01/19/15 2117  . insulin aspart protamine- aspart (NOVOLOG MIX 70/30) injection 28 Units  28 Units Subcutaneous BID WC Calvert CantorSaima Rizwan, MD   28 Units at 01/21/15 0906  . living well with diabetes book MISC   Does not apply Once Richarda OverlieNayana Abrol, MD      . metoprolol (LOPRESSOR) tablet 50 mg  50 mg Oral BID Calvert CantorSaima Rizwan, MD   50 mg at 01/21/15 1100  . neomycin-bacitracin-polymyxin (NEOSPORIN) ointment   Topical Daily Richarda OverlieNayana Abrol, MD      . nicotine (NICODERM CQ - dosed in mg/24 hr) patch 7 mg  7 mg Transdermal Daily Ron ParkerHarvette C Jenkins, MD   7 mg at  01/09/15 0855  . ondansetron (ZOFRAN) tablet 4 mg  4 mg Oral Q6H PRN Ron Parker, MD   4 mg at 01/10/15 0012   Or  . ondansetron (ZOFRAN) injection 4 mg  4 mg Intravenous Q6H PRN Ron Parker, MD   4 mg at 01/11/15 0721  . oxyCODONE (Oxy IR/ROXICODONE) immediate release tablet 5-10 mg  5-10 mg Oral Q4H PRN Kathlen Mody, MD   10 mg at 01/21/15 0900  . sodium chloride 0.9 % bolus 500 mL  500 mL Intravenous Once Richarda Overlie, MD        PE: General appearance: alert, cooperative and no distress Lungs: clear to auscultation bilaterally Heart: regular rate and rhythm, S1, S2 normal, no murmur, click, rub or gallop Extremities: 2+ LEE Pulses: 2+ and symmetric Skin: Warm and dry Neurologic: Grossly normal  Lab Results:   Recent Labs  01/19/15 0321 01/20/15 0433 01/21/15 0418  WBC 13.7* 14.3* 15.4*  HGB 8.9* 9.3* 8.7*  HCT 27.0* 28.6* 27.2*    PLT 434* 467* 458*   BMET  Recent Labs  01/19/15 0321 01/20/15 0433 01/21/15 0418  NA 137 135 134*  K 3.4* 4.1 3.9  CL 104 97 95*  CO2 GLUCOSE 116* 184* 131*  BUN CREATININE 2.20* 1.96* 2.12*  CALCIUM 7.9* 7.9* 8.0*     Assessment/Plan   1. Elevated trop: in the setting of endocarditis, myocardial inflammation.  2. MV endocarditis with infectious aneurysm, and valve perforation and severe MR - septic emboli to brain - shoulder aspirate with WBC  - ID following - seen by Dr. Barry Dienes with CT surgery(see note), Has shown to have septic emboli to the brain based on MRI of brain. ? if has emboli to kidney as well, however prefer not to have CTA of abdomen given recent decline in renal function . - Pericarditis, small effusion.   Needs tooth extraction prior to valvular surgery L/R heart caths possibly Monday.  Contrast nephropathy will be a concern    EF normal. At this time, ortho team recommends med mgt. S/p aspiration.  - Per ortho, no plan for intervention, R shoulder stable  Diuresis improved.  SCr relatively stable.  Net fluids: -1.3L/-2.5L.  Lungs are clear on exam. Appear to need more diuresis.  Continue lasix today.      3. Staph bacteremia, MSSA- ID, penicillin. Now MRI of spine concerning for osteo.  - blood culture obtained on 1/19 after WBC went up.  4. PVD, LE ABI 0.64.  No DVT, Monitor.  5. Acute on chronic renal insufficiency: improving on IV fluids, slightly worsened after diuresis 6. Hypoalbuminemia 7. Hyponatremia  8. Anemia  Stable    LOS: 12 days    Willet Schleifer PA-C 01/21/2015 11:41 AM

## 2015-01-21 NOTE — Progress Notes (Signed)
[address]       Dwayne [name] [id number]             [phone]     CARDIOTHORACIC SURGERY PROGRESS NOTE  8 Days Post-Op  S/P Procedure(s) (LRB): TRANSESOPHAGEAL ECHOCARDIOGRAM (TEE) (N/A)  Subjective: Patient reports that overall he feels a little better, although improvement has been very slow.  Denies SOB.  Right shoulder pain persists, although he can move his shoulder reasonably well.  He also reports pain in left forearm and some persistent pain in left thigh.  No significant back pain.  Mild pain in chest w/ deep inspiriation  Objective: Vital signs in last 24 hours: Temp:  [98.4 F (36.9 C)-98.6 F (37 C)] 98.4 F (36.9 C) (01/22 0452) Pulse Rate:  [77-81] 81 (01/22 0452) Cardiac Rhythm:  [-] Normal sinus rhythm;Sinus tachycardia (01/22 0900) Resp:  [19-21] 19 (01/22 0452) BP: (126-137)/(63-75) 126/63 mmHg (01/22 0452) SpO2:  [99 %-100 %] 99 % (01/22 0452) Weight:  [106.822 kg (235 lb 8 oz)] 106.822 kg (235 lb 8 oz) (01/22 0500)  Physical Exam:  Rhythm:   sinus  Breath sounds: Slightly diminished right base, otherwise clear  Heart sounds:  RRR w/ II/VI systolic murmur  Incisions:  n/a  Abdomen:  Soft, non-distended, non-tender  Extremities:  Warm, well-perfused, distal pulses intact left wrist, mild bilateral LE edema    Intake/Output from previous day: 01/21 0701 - 01/22 0700 In: 720 [P.O.:720] Out: 2000 [Urine:2000] Intake/Output this shift: Total I/O In: 240 [P.O.:240] Out: 400 [Urine:400]  Lab Results:  Recent Labs  01/20/15 0433 01/21/15 0418  WBC 14.3* 15.4*  HGB 9.3* 8.7*  HCT 28.6* 27.2*  PLT 467* 458*   BMET:  Recent Labs  01/20/15 0433 01/21/15 0418  NA 135 134*  K 4.1 3.9  CL 97 95*  CO2 30 28  GLUCOSE 184* 131*  BUN 17 19  CREATININE 1.96* 2.12*  CALCIUM 7.9* 8.0*    CBG (last 3)   Recent Labs  01/20/15 1632 01/20/15 2125 01/21/15 0612  GLUCAP 118* 95 141*   PT/INR:  No results for input(s):  LABPROT, INR in the last 72 hours.  CXR:  N/A  Assessment/Plan: S/P Procedure(s) (LRB): TRANSESOPHAGEAL ECHOCARDIOGRAM (TEE) (N/A)  Dwayne [name] remains very stable from a cardiac standpoint.  There are no signs of worsening CHF nor have their been any significant rhythm changes.  Will check 12-lead EKG to f/u PR interval and CXR to look for right pleural effusion or other signs of CHF.    Dwayne [name]'s serum creatinine appears to be stabilizing around 2.0-2.1.  I suspect that he embolized to one or both kidneys, as he never had any periods of hypotension to explain the development of acute on chronic renal insufficiency.  At some point he will need diagnostic cardiac catheterization, and it might be reasonable to proceed soon since his renal function may not improve much further.  Will defer to the cardiology team.  I agree w/ plans to proceed with dental extraction next week.  Similarly, there is no reason not to proceed with I+D of right shoulder if indicated, but it not clear that he needs shoulder I+D at this time.  Left forearm pain may be another area of myositis, but distal pulses are intact in the left wrist.  I think Dwayne [name] needs PT consult to assist w/ mobility and rehab.     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  Under the circumstances I favor completing a full 6 week course of antibiotics before proceeding with mitral valve repair/replacement.  This will improve the likelihood of valve repair and diminish the chances of recurrent/persistent endocarditis postoperatively.  However, because he still has persistent leukocytosis and pain in numerous locations (presumably related to septic arthritis and myositis), he is probably not currently a candidate for outpatient therapy.  Moreover, the fact that the organism involved is Staphylococcus  aureus he still has the potential to deteriorate clinically without much warning.  Will continue to follow intermittently, but please call if specific questions arise.  I spent in excess of 30 minutes during the conduct of this hospital encounter and >50% of this time involved direct face-to-face encounter with the patient for counseling and/or coordination of their care.   OWEN,CLARENCE H 01/21/2015 10:06 AM

## 2015-01-21 NOTE — Progress Notes (Signed)
[facility] PROGRESS NOTE  Dwayne HickmanBenjamin [name] YNW:[id number]:[id number] DOB: 11/27/1980 DOA: 01/09/2015 PCP: No PCP Per Patient    Brief narrative: Dwayne [name] is a 35 y.o. male with a history of Uncontrolled HTN, DM2 who presents to the ED with complaints of diarrhea and fevers and chills x 24 hours. He reports having weakness and fatigue and He denies any nausea or vomiting. No one is sick at home, and he denies taking any recent antibiotic therapy. He reports that he has been out of his medications for months, and has not seen his PCP in months as well. he was found to have MSSA bacteremia and came in septic shock. He underwent TEE showing MV endocarditis. CTVs consulted and plan for surgery in the next week. Dental medicine consulted and plan for extraction int he next few  Days as their schedule permits.   HPI/Subjective: Reports chest soreness. Right shoulder pain and left forearm pain . He is unable to lift the right shoulder.   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 Algis LimingVanDam recommends 8 weeks of antibiotics post heart surgery and all other potential surgeries - seen by Dr. Barry Dieneswens with CT surgery-- He would prefer the bacteremia to clear up first, however, he is concerned about the degree his MR worsened in 2 days between TTE and TEE. May need surgery probably next week before he goes in to heart failure.. - per Dr. Cornelius Moraswen, ideally need cardiac cath- Cr clearance prohibitive of this at this time. Planning for cardiac cath on monday when creatinine improves.  - cardiology also following and assisting with management- on IV lasix for diuresis. He has been appropriately diuresed, sob has improved. Plan to change to oral lasix in am.  - WBC up today - management per ID.  Dental caries - dental  medicine consulted for extraction prior to valve replacement. Plan for dental extraction next week after the cardiac cath.   HTN - BP was better controlled - increased Lopressor to 50 bid and added oral hydralazine- cont to follow at current doses. No more episodes of hypotension.   Poorly controlled diabetes - appears to have a poor understand about the need to control oral glucose/carb intake-  We have spoken with him the dietary restrictions he needs to follow Hemoglobin A1c 8.1. He reportsbeing a diabetic since the age of 35.  Patient admits to being noncompliant with medications Increased 70/30 to achieve better control- now sugars improved As he is on 70/30, he should technically need a sliding scale at lunch only rather than TID - have modified this   Acute renal failure Improving.  may be related to sepsis and diuresis-. Continue to monitor. His creatinine is around 2 .   Right shoulder- septic arthritis/ synovitis : worsened pain today. Better with pain meds.  - s/p aspiration by IR -ID team recommended I and D to resolve infection -  medical management recommended by ortho for now as taking him to the OR with his currently infected valve would be too high risk  Acute osteomyelitis of T3T4 - noted on MRI to have increased signal which may be osteomyelitis  Myositis L4-L5 area and entire left leg  - noted on MRI  Septic emboli to the brain   Diarrhea C. difficile negative  Anemia: Normocytic and appears to be at baseline and stable.    Hypokalemia:  repleted as needed.    Code Status: full Family Communication: none at bedside Disposition Plan:  As above   Consultants:  Cardiothoracic surgery  Dental medicine  Infectious disease  cardiology  Procedures:  None  Antibiotics: Anti-infectives    Start     Dose/Rate Route Frequency Ordered Stop   01/19/15 2000  ampicillin (OMNIPEN) 2 g in sodium chloride 0.9 % 50 mL IVPB     2 g150 mL/hr  over 20 Minutes Intravenous 6 times per day 01/19/15 1121     01/18/15 2000  penicillin G potassium 12 Million Units in dextrose 5 % 500 mL continuous infusion     12 Million Units41.7 mL/hr over 12 Hours Intravenous Every 12 hours 01/18/15 1432 01/19/15 2050   01/15/15 1600  nafcillin 2 g in dextrose 5 % 50 mL IVPB  Status:  Discontinued     2 g100 mL/hr over 30 Minutes Intravenous 6 times per day 01/15/15 1503 01/15/15 1517   01/15/15 1600  nafcillin 2 g in dextrose 5 % 50 mL IVPB     2 g100 mL/hr over 30 Minutes Intravenous 6 times per day 01/15/15 1517 01/18/15 1753   01/15/15 0930  nafcillin injection 2 g  Status:  Discontinued     2 g Intravenous Every 4 hours 01/15/15 0921 01/15/15 1503   01/14/15 1500  DAPTOmycin (CUBICIN) 600 mg in sodium chloride 0.9 % IVPB  Status:  Discontinued     600 mg224 mL/hr over 30 Minutes Intravenous Every 48 hours 01/12/15 1421 01/13/15 1239   01/13/15 1500  ceFAZolin (ANCEF) IVPB 2 g/50 mL premix  Status:  Discontinued     2 g100 mL/hr over 30 Minutes Intravenous 3 times per day 01/13/15 1407 01/15/15 0921   01/12/15 1600  ceFAZolin (ANCEF) IVPB 2 g/50 mL premix  Status:  Discontinued     2 g100 mL/hr over 30 Minutes Intravenous Every 12 hours 01/12/15 1419 01/13/15 1407   01/11/15 1200  DAPTOmycin (CUBICIN) 600 mg in sodium chloride 0.9 % IVPB  Status:  Discontinued     600 mg224 mL/hr over 30 Minutes Intravenous Every 24 hours 01/11/15 1003 01/12/15 1421   01/10/15 1200  vancomycin (VANCOCIN) IVPB 1000 mg/200 mL premix  Status:  Discontinued     1,000 mg200 mL/hr over 60 Minutes Intravenous Every 8 hours 01/10/15 1044 01/11/15 0947   01/10/15 1100  piperacillin-tazobactam (ZOSYN) IVPB 3.375 g  Status:  Discontinued     3.375 g12.5 mL/hr over 240 Minutes Intravenous Every 8 hours 01/10/15 1043 01/11/15 0902   01/09/15 0700  metroNIDAZOLE (FLAGYL) IVPB 500 mg  Status:  Discontinued     500 mg100 mL/hr over 60 Minutes Intravenous Every 8 hours 01/09/15 0651  01/10/15 1018       Objective: Filed Vitals:   01/20/15 2021 01/21/15 0452 01/21/15 0500 01/21/15 1336  BP: 137/75 126/63  127/65  Pulse: 79 81  72  Temp: 98.6 F (37 C) 98.4 F (36.9 C)  98.3 F (36.8 C)  TempSrc: Oral Oral  Oral  Resp: Height:      Weight:   106.822 kg (235 lb 8 oz)   SpO2: 100% 99%  100%    Intake/Output Summary (Last 24 hours) at 01/21/15 1550 Last data filed at 01/21/15 1337  Gross per 24 hour  Intake    840 ml  Output   2300 ml  Net  -1460 ml    Exam:  General: alert  Not in  any distress.  Cardiovascular: RRR, nl S1 s2 + murmur 2+ at right sternal border. Respiratory: Decreased breath sounds at the bases, scattered rhonchi, no crackles  Abdomen: soft +BS NT/ND, no masses palpable  Extremities: No cyanosis and no edema      Data Reviewed: Basic Metabolic Panel:  Recent Labs Lab 01/17/15 0535 01/18/15 0357 01/19/15 0321 01/20/15 0433 01/21/15 0418  NA 132* 135 137 135 134*  K 3.6 3.3* 3.4* 4.1 3.9  CL 105 104 104 97 95*  CO2 GLUCOSE 228* 67* 116* 184* 131*  BUN CREATININE 1.86* 2.13* 2.20* 1.96* 2.12*  CALCIUM 7.6* 8.1* 7.9* 7.9* 8.0*    Liver Function Tests:  Recent Labs Lab 01/15/15 1540 01/16/15 0509 01/17/15 0535 01/18/15 0357 01/19/15 0321  AST 40* 42* ALT ALKPHOS 263* 233* 209* 202* 185*  BILITOT 0.3 0.9 1.2 1.6* 0.9  PROT 6.0 5.5* 6.0 6.5 5.8*  ALBUMIN 1.3* 1.2* 1.2* 1.3* 1.2*   No results for input(s): LIPASE, AMYLASE in the last 168 hours. No results for input(s): AMMONIA in the last 168 hours.  CBC:  Recent Labs Lab 01/17/15 0535 01/18/15 0357 01/19/15 0321 01/20/15 0433 01/20/15 1602 01/21/15 0418  WBC 13.7* 20.2* 13.7* 14.3*  --  15.4*  NEUTROABS  --   --   --  12.2* 11.7*  --   HGB 9.5* 9.8* 8.9* 9.3*  --  8.7*  HCT 28.7* 29.6* 27.0* 28.6*  --  27.2*  MCV 86.2 86.0 86.3 88.3  --  89.2  PLT 387 506* 434* 467*  --  458*     Cardiac Enzymes:  Recent Labs Lab 01/19/15 0321  CKTOTAL 152   BNP (last 3 results) No results for input(s): PROBNP in the last 8760 hours.   CBG:  Recent Labs Lab 01/20/15 1113 01/20/15 1632 01/20/15 2125 01/21/15 0612 01/21/15 1122  GLUCAP 93 118* 95 141* 131*    Imaging results reviewed in detail.    Scheduled Meds: . ampicillin (OMNIPEN) IV  2 g Intravenous 6 times per day  . bisacodyl  10 mg Rectal Once  . chlorhexidine  15 mL Mouth/Throat BID  . feeding supplement (PRO-STAT SUGAR FREE 64)  30 mL Oral TID WC  . [START ON 01/22/2015] furosemide  40 mg Oral Daily  . hydrALAZINE  25 mg Oral 3 times per day  . Influenza vac split quadrivalent PF  0.5 mL Intramuscular Tomorrow-1000  . insulin aspart  0-15 Units Subcutaneous QAC lunch  . insulin aspart  0-5 Units Subcutaneous QHS  . insulin aspart protamine- aspart  28 Units Subcutaneous BID WC  . living well with diabetes book   Does not apply Once  . metoprolol tartrate  50 mg Oral BID  . neomycin-bacitracin-polymyxin   Topical Daily  . nicotine  7 mg Transdermal Daily  . sodium chloride  500 mL Intravenous Once   Continuous Infusions:    Time spent: 30 minutes, which includes 50% of the time with face-to-face with patient and coordinating care related to the above assessment and plan.   [facility]  [facility] [phone] Pager-  [email or web address], password [facility] 01/21/2015, 3:50 PM  LOS: 12 days

## 2015-01-22 ENCOUNTER — Inpatient Hospital Stay (HOSPITAL_COMMUNITY): Payer: Medicaid Other

## 2015-01-22 LAB — CBC
HCT: 25.9 % — ABNORMAL LOW (ref 39.0–52.0)
HEMOGLOBIN: 8.2 g/dL — AB (ref 13.0–17.0)
MCH: 27.8 pg (ref 26.0–34.0)
MCHC: 31.7 g/dL (ref 30.0–36.0)
MCV: 87.8 fL (ref 78.0–100.0)
PLATELETS: 421 10*3/uL — AB (ref 150–400)
RBC: 2.95 MIL/uL — ABNORMAL LOW (ref 4.22–5.81)
RDW: 14 % (ref 11.5–15.5)
WBC: 10.9 10*3/uL — ABNORMAL HIGH (ref 4.0–10.5)

## 2015-01-22 LAB — BASIC METABOLIC PANEL
Anion gap: 11 (ref 5–15)
BUN: 20 mg/dL (ref 6–23)
CO2: 29 mmol/L (ref 19–32)
Calcium: 8.4 mg/dL (ref 8.4–10.5)
Chloride: 93 mmol/L — ABNORMAL LOW (ref 96–112)
Creatinine, Ser: 2.16 mg/dL — ABNORMAL HIGH (ref 0.50–1.35)
GFR calc Af Amer: 44 mL/min — ABNORMAL LOW (ref >90)
GFR, EST NON AFRICAN AMERICAN: 38 mL/min — AB (ref >90)
Glucose, Bld: 97 mg/dL (ref 70–99)
POTASSIUM: 3.8 mmol/L (ref 3.5–5.1)
Sodium: 133 mmol/L — ABNORMAL LOW (ref 135–145)

## 2015-01-22 LAB — GLUCOSE, CAPILLARY
GLUCOSE-CAPILLARY: 99 mg/dL (ref 70–99)
Glucose-Capillary: 108 mg/dL — ABNORMAL HIGH (ref 70–99)
Glucose-Capillary: 117 mg/dL — ABNORMAL HIGH (ref 70–99)
Glucose-Capillary: 100 mg/dL — ABNORMAL HIGH (ref 70–99)
Glucose-Capillary: 67 mg/dL — ABNORMAL LOW (ref 70–99)

## 2015-01-22 LAB — BRAIN NATRIURETIC PEPTIDE: B Natriuretic Peptide: 218.9 pg/mL — ABNORMAL HIGH (ref 0.0–100.0)

## 2015-01-22 NOTE — Progress Notes (Signed)
Consulting cardiologist: Dr. Kristeen Miss  Seen for followup:  Mitral valve endocarditis, severe mitral regurgitation  Subjective:    Patient reports no shortness of breath at rest, no chest pain. Describes limited range of motion in his right shoulder and pain with movement.  Objective:   Temp:  [98.3 F (36.8 C)-98.4 F (36.9 C)] 98.4 F (36.9 C) (01/23 0439) Pulse Rate:  [72-86] 86 (01/23 0948) Resp:  [18-20] 18 (01/23 0439) BP: (127-156)/(65-88) 136/68 mmHg (01/23 0948) SpO2:  [99 %-100 %] 100 % (01/23 0439) Weight:  [236 lb 12.4 oz (107.4 kg)] 236 lb 12.4 oz (107.4 kg) (01/23 0439) Last BM Date: 01/17/15  Filed Weights   01/20/15 0359 01/21/15 0500 01/22/15 0439  Weight: 234 lb 1.6 oz (106.187 kg) 235 lb 8 oz (106.822 kg) 236 lb 12.4 oz (107.4 kg)    Intake/Output Summary (Last 24 hours) at 01/22/15 1006 Last data filed at 01/22/15 0821  Gross per 24 hour  Intake   1280 ml  Output   1705 ml  Net   -425 ml    Telemetry: Sinus rhythm.  Exam:  General: No distress.  Lungs: Clear, nonlabored  Cardiac: RRR with 2/6 apical systolic murmur.  Abdomen: NABS.  Extremities: No pitting edema.   Lab Results:  Basic Metabolic Panel:  Recent Labs Lab 01/19/15 0321 01/20/15 0433 01/21/15 0418  NA 137 135 134*  K 3.4* 4.1 3.9  CL 104 97 95*  CO2 GLUCOSE 116* 184* 131*  BUN CREATININE 2.20* 1.96* 2.12*  CALCIUM 7.9* 7.9* 8.0*    Liver Function Tests:  Recent Labs Lab 01/17/15 0535 01/18/15 0357 01/19/15 0321  AST ALT ALKPHOS 209* 202* 185*  BILITOT 1.2 1.6* 0.9  PROT 6.0 6.5 5.8*  ALBUMIN 1.2* 1.3* 1.2*    CBC:  Recent Labs Lab 01/20/15 0433 01/21/15 0418 01/22/15 0347  WBC 14.3* 15.4* 10.9*  HGB 9.3* 8.7* 8.2*  HCT 28.6* 27.2* 25.9*  MCV 88.3 89.2 87.8  PLT 467* 458* 421*    Imaging: EXAM: CHEST - 1 VIEW  COMPARISON: 01/14/2015  FINDINGS: Cardiac shadow is mildly enlarged.  Bibasilar atelectatic changes and effusions are seen slightly increased from the prior exam. No other focal abnormality is noted.  IMPRESSION: Slight increase in bibasilar changes.   Medications:   Scheduled Medications: . ampicillin (OMNIPEN) IV  2 g Intravenous 6 times per day  . bisacodyl  10 mg Rectal Once  . chlorhexidine  15 mL Mouth/Throat BID  . feeding supplement (PRO-STAT SUGAR FREE 64)  30 mL Oral TID WC  . furosemide  40 mg Oral Daily  . hydrALAZINE  25 mg Oral 3 times per day  . Influenza vac split quadrivalent PF  0.5 mL Intramuscular Tomorrow-1000  . insulin aspart  0-15 Units Subcutaneous QAC lunch  . insulin aspart  0-5 Units Subcutaneous QHS  . insulin aspart protamine- aspart  28 Units Subcutaneous BID WC  . living well with diabetes book   Does not apply Once  . metoprolol tartrate  50 mg Oral BID  . neomycin-bacitracin-polymyxin   Topical Daily  . nicotine  7 mg Transdermal Daily  . sodium chloride  500 mL Intravenous Once     PRN Medications:  acetaminophen **OR** acetaminophen, alum & mag hydroxide-simeth, hydrALAZINE, HYDROmorphone (DILAUDID) injection, ondansetron **OR** ondansetron (ZOFRAN) IV, oxyCODONE   Assessment:   1. Mitral valve endocarditis, MSSA bacteremia documented. This  is associated with an infectious aneurysm with perforation at the base of the medial scallop of the posterior mitral leaflet and severe mitral regurgitation. Patient is on IV antibiotics, has been seen by Dr. Cornelius Moraswen. He is pending dental extractions and also probable heart catheterization next week in anticipation ultimately of valve surgery.  2. Diastolic heart failure, complicated by valvular heart disease.  3. Osteomyelitis of T3 and T4, also right shoulder septic arthritis/synovitis.  4. Evidence of septic emboli to brain.  5. Acute renal failure, creatinine 2.1 yesterday. Possibly embolic in etiology and also contributed to by diuresis.   Plan/Discussion:     Need follow-up BMET to reassess renal function. He has on oral Lasix at this time. Otherwise continue hydralazine and Lopressor. Tentative plans for coronary angiography on Monday presuming renal function stabilizes.   Jonelle SidleSamuel G. Khaidyn Staebell, M.D., F.A.C.C.

## 2015-01-22 NOTE — Progress Notes (Signed)
[facility] PROGRESS NOTE  Dwayne [name] WGN:[id number] DOB: [birth year] DOA: 01/09/2015 PCP: No PCP Per Patient    Brief narrative: Dwayne [name] is a 35 y.o. male with a history of Uncontrolled HTN, DM2 who presents to the ED with complaints of diarrhea and fevers and chills x 24 hours. He reports having weakness and fatigue and He denies any nausea or vomiting. No one is sick at home, and he denies taking any recent antibiotic therapy. He reports that he has been out of his medications for months, and has not seen his PCP in months as well. he was found to have MSSA bacteremia and came in septic shock. He underwent TEE showing MV endocarditis. CTVs consulted and plan for surgery in the next week. Dental medicine consulted and plan for extraction int he next few  Days as their schedule permits.   HPI/Subjective: No chest pain or sob , saw him walking in the hallway. Appears comfortable. Still worried about his  Right shoulder pain and left forearm pain . He is unable to lift the right shoulder.   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 Algis Liming recommends 8 weeks of antibiotics post heart surgery and all other potential surgeries - seen by Dr. Barry Dienes with CT surgery-- He would prefer the bacteremia to clear up first, however, he is concerned about the degree his MR worsened in 2 days between TTE and TEE. May need surgery probably next week before he goes in to heart failure.. - per Dr. Cornelius Moras, ideally need cardiac cath- Cr clearance prohibitive of this at this time. Planning for cardiac cath on monday when creatinine improves.  - cardiology also following and assisting with management- on IV lasix for diuresis. He has been appropriately diuresed, sob has improved. Plan to change to oral  lasix in am.  - WBC up today - management per ID.  Dental caries - dental medicine consulted for extraction prior to valve replacement. Plan for dental extraction next week after the cardiac cath.   HTN - BP was better controlled - increased Lopressor to 50 bid and added oral hydralazine- cont to follow at current doses. No more episodes of hypotension.   Poorly controlled diabetes - appears to have a poor understand about the need to control oral glucose/carb intake-  We have spoken with him the dietary restrictions he needs to follow Hemoglobin A1c 8.1. He reportsbeing a diabetic since the age of 50.  Patient admits to being noncompliant with medications Increased 70/30 to achieve better control- now sugars improved As he is on 70/30, he should technically need a sliding scale at lunch only rather than TID - have modified this   Acute renal failure Improving.  may be related to sepsis and diuresis-. Continue to monitor. His creatinine is around 2 .   Right shoulder- septic arthritis/ synovitis : worsened pain today. Better with pain meds.  - s/p aspiration by IR -ID team recommended I and D to resolve infection -  medical management recommended by ortho for now as taking him to the OR with his currently infected valve would be too high risk  Acute osteomyelitis of T3T4 - noted on MRI to have increased signal which may be osteomyelitis  Myositis L4-L5 area and entire left leg  - noted on MRI  Septic emboli to the brain   Diarrhea C. difficile negative  Anemia: Normocytic and appears to be at baseline and stable.    Hypokalemia:  repleted as needed.    Code Status: full Family Communication: none at bedside Disposition Plan:  As above   Consultants:  Cardiothoracic surgery  Dental medicine  Infectious disease  cardiology  Procedures:  None  Antibiotics: Anti-infectives    Start     Dose/Rate Route Frequency Ordered Stop   01/19/15 2000   ampicillin (OMNIPEN) 2 g in sodium chloride 0.9 % 50 mL IVPB     2 g150 mL/hr over 20 Minutes Intravenous 6 times per day 01/19/15 1121     01/18/15 2000  penicillin G potassium 12 Million Units in dextrose 5 % 500 mL continuous infusion     12 Million Units41.7 mL/hr over 12 Hours Intravenous Every 12 hours 01/18/15 1432 01/19/15 2050   01/15/15 1600  nafcillin 2 g in dextrose 5 % 50 mL IVPB  Status:  Discontinued     2 g100 mL/hr over 30 Minutes Intravenous 6 times per day 01/15/15 1503 01/15/15 1517   01/15/15 1600  nafcillin 2 g in dextrose 5 % 50 mL IVPB     2 g100 mL/hr over 30 Minutes Intravenous 6 times per day 01/15/15 1517 01/18/15 1753   01/15/15 0930  nafcillin injection 2 g  Status:  Discontinued     2 g Intravenous Every 4 hours 01/15/15 0921 01/15/15 1503   01/14/15 1500  DAPTOmycin (CUBICIN) 600 mg in sodium chloride 0.9 % IVPB  Status:  Discontinued     600 mg224 mL/hr over 30 Minutes Intravenous Every 48 hours 01/12/15 1421 01/13/15 1239   01/13/15 1500  ceFAZolin (ANCEF) IVPB 2 g/50 mL premix  Status:  Discontinued     2 g100 mL/hr over 30 Minutes Intravenous 3 times per day 01/13/15 1407 01/15/15 0921   01/12/15 1600  ceFAZolin (ANCEF) IVPB 2 g/50 mL premix  Status:  Discontinued     2 g100 mL/hr over 30 Minutes Intravenous Every 12 hours 01/12/15 1419 01/13/15 1407   01/11/15 1200  DAPTOmycin (CUBICIN) 600 mg in sodium chloride 0.9 % IVPB  Status:  Discontinued     600 mg224 mL/hr over 30 Minutes Intravenous Every 24 hours 01/11/15 1003 01/12/15 1421   01/10/15 1200  vancomycin (VANCOCIN) IVPB 1000 mg/200 mL premix  Status:  Discontinued     1,000 mg200 mL/hr over 60 Minutes Intravenous Every 8 hours 01/10/15 1044 01/11/15 0947   01/10/15 1100  piperacillin-tazobactam (ZOSYN) IVPB 3.375 g  Status:  Discontinued     3.375 g12.5 mL/hr over 240 Minutes Intravenous Every 8 hours 01/10/15 1043 01/11/15 0902   01/09/15 0700  metroNIDAZOLE (FLAGYL) IVPB 500 mg  Status:   Discontinued     500 mg100 mL/hr over 60 Minutes Intravenous Every 8 hours 01/09/15 0651 01/10/15 1018       Objective: Filed Vitals:   01/21/15 1958 01/22/15 0439 01/22/15 0948 01/22/15 1324  BP: 156/88 132/77 136/68 123/66  Pulse: 85 76 86 75  Temp: 98.3 F (36.8 C) 98.4 F (36.9 C)  98.1 F (36.7 C)  TempSrc: Oral Oral  Oral  Resp: 18 18  16   Height:      Weight:  107.4 kg (236 lb 12.4 oz)    SpO2: 99% 100%  100%    Intake/Output Summary (Last 24 hours) at 01/22/15 1529 Last data filed at 01/22/15 1200  Gross per 24 hour  Intake   2120 ml  Output   1205 ml  Net    915 ml    Exam:  General: alert  Not in any distress.  Cardiovascular: RRR, nl S1 s2 + murmur 2+ at right sternal border. Respiratory: Decreased breath sounds at the bases, scattered rhonchi, no crackles  Abdomen: soft +BS NT/ND, no masses palpable  Extremities: No cyanosis and no edema      Data Reviewed: Basic Metabolic Panel:  Recent Labs Lab 01/18/15 0357 01/19/15 0321 01/20/15 0433 01/21/15 0418 01/22/15 1232  NA 135 137 135 134* 133*  K 3.3* 3.4* 4.1 3.9 3.8  CL 104 104 97 95* 93*  CO2 GLUCOSE 67* 116* 184* 131* 97  BUN CREATININE 2.13* 2.20* 1.96* 2.12* 2.16*  CALCIUM 8.1* 7.9* 7.9* 8.0* 8.4    Liver Function Tests:  Recent Labs Lab 01/15/15 1540 01/16/15 0509 01/17/15 0535 01/18/15 0357 01/19/15 0321  AST 40* 42* ALT ALKPHOS 263* 233* 209* 202* 185*  BILITOT 0.3 0.9 1.2 1.6* 0.9  PROT 6.0 5.5* 6.0 6.5 5.8*  ALBUMIN 1.3* 1.2* 1.2* 1.3* 1.2*   No results for input(s): LIPASE, AMYLASE in the last 168 hours. No results for input(s): AMMONIA in the last 168 hours.  CBC:  Recent Labs Lab 01/18/15 0357 01/19/15 0321 01/20/15 0433 01/20/15 1602 01/21/15 0418 01/22/15 0347  WBC 20.2* 13.7* 14.3*  --  15.4* 10.9*  NEUTROABS  --   --  12.2* 11.7*  --   --   HGB 9.8* 8.9* 9.3*  --  8.7* 8.2*  HCT 29.6* 27.0*  28.6*  --  27.2* 25.9*  MCV 86.0 86.3 88.3  --  89.2 87.8  PLT 506* 434* 467*  --  458* 421*    Cardiac Enzymes:  Recent Labs Lab 01/19/15 0321  CKTOTAL 152   BNP (last 3 results) No results for input(s): PROBNP in the last 8760 hours.   CBG:  Recent Labs Lab 01/21/15 1122 01/21/15 1610 01/21/15 2128 01/22/15 0606 01/22/15 1141  GLUCAP 131* 83 171* 108* 117*    Imaging results reviewed in detail.    Scheduled Meds: . ampicillin (OMNIPEN) IV  2 g Intravenous 6 times per day  . chlorhexidine  15 mL Mouth/Throat BID  . feeding supplement (PRO-STAT SUGAR FREE 64)  30 mL Oral TID WC  . furosemide  40 mg Oral Daily  . hydrALAZINE  25 mg Oral 3 times per day  . Influenza vac split quadrivalent PF  0.5 mL Intramuscular Tomorrow-1000  . insulin aspart  0-15 Units Subcutaneous QAC lunch  . insulin aspart  0-5 Units Subcutaneous QHS  . insulin aspart protamine- aspart  28 Units Subcutaneous BID WC  . living well with diabetes book   Does not apply Once  . metoprolol tartrate  50 mg Oral BID  . neomycin-bacitracin-polymyxin   Topical Daily  . nicotine  7 mg Transdermal Daily   Continuous Infusions:    Time spent: 30 minutes, which includes 50% of the time with face-to-face with patient and coordinating care related to the above assessment and plan.   [facility]  [facility] [phone] Pager-  [email or web address], password [facility] 01/22/2015, 3:29 PM  LOS: 13 days

## 2015-01-23 LAB — BASIC METABOLIC PANEL
ANION GAP: 14 (ref 5–15)
BUN: 26 mg/dL — AB (ref 6–23)
CHLORIDE: 95 mmol/L — AB (ref 96–112)
CO2: 25 mmol/L (ref 19–32)
CREATININE: 2.27 mg/dL — AB (ref 0.50–1.35)
Calcium: 8.3 mg/dL — ABNORMAL LOW (ref 8.4–10.5)
GFR calc non Af Amer: 36 mL/min — ABNORMAL LOW (ref >90)
GFR, EST AFRICAN AMERICAN: 42 mL/min — AB (ref >90)
Glucose, Bld: 164 mg/dL — ABNORMAL HIGH (ref 70–99)
Potassium: 4.1 mmol/L (ref 3.5–5.1)
Sodium: 134 mmol/L — ABNORMAL LOW (ref 135–145)

## 2015-01-23 LAB — GLUCOSE, CAPILLARY
Glucose-Capillary: 122 mg/dL — ABNORMAL HIGH (ref 70–99)
Glucose-Capillary: 173 mg/dL — ABNORMAL HIGH (ref 70–99)
Glucose-Capillary: 169 mg/dL — ABNORMAL HIGH (ref 70–99)
Glucose-Capillary: 225 mg/dL — ABNORMAL HIGH (ref 70–99)

## 2015-01-23 MED ORDER — INSULIN ASPART PROT & ASPART (70-30 MIX) 100 UNIT/ML ~~LOC~~ SUSP
14.0000 [IU] | Freq: Two times a day (BID) | SUBCUTANEOUS | Status: DC
  Administered 2015-01-24 – 2015-01-28 (×6): 14 [IU] via SUBCUTANEOUS
  Filled 2015-01-23: qty 10

## 2015-01-23 MED ORDER — HYDROMORPHONE HCL 1 MG/ML IJ SOLN
1.0000 mg | INTRAMUSCULAR | Status: DC | PRN
  Administered 2015-01-27: 1 mg via INTRAVENOUS
  Administered 2015-01-28: 2 mg via INTRAVENOUS
  Filled 2015-01-23 (×2): qty 1
  Filled 2015-01-23: qty 2

## 2015-01-23 NOTE — Progress Notes (Signed)
[facility] PROGRESS NOTE  Dwayne [name] WUJ:[id number] DOB: 1980-01-08 DOA: 01/09/2015 PCP: No PCP Per Patient    Brief narrative: Dwayne [name] is a 35 y.o. male with a history of Uncontrolled HTN, DM2 who presents to the ED with complaints of diarrhea and fevers and chills x 24 hours. He reports having weakness and fatigue and He denies any nausea or vomiting. No one is sick at home, and he denies taking any recent antibiotic therapy. He reports that he has been out of his medications for months, and has not seen his PCP in months as well. he was found to have MSSA bacteremia and came in septic shock. He underwent TEE showing MV endocarditis. CTVs consulted and plan for surgery in the next week. Dental medicine consulted and plan for extraction int he next few  days as their schedule permits.   plan for cardiac catheterization on Monday.  HPI/Subjective: No chest pain or sob , persistent shoulder pain. Pain controlled with increased pain meds.  He is unable to lift the right shoulder.   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 Algis Liming recommends 8 weeks of antibiotics post heart surgery and all other potential surgeries - seen by Dr. Barry Dienes with CT surgery-- He would prefer the bacteremia to clear up first, however, he is concerned about the degree his MR worsened in 2 days between TTE and TEE. May need surgery probably next week before he goes in to heart failure.. - per Dr. Cornelius Moras, ideally need cardiac cath- Cr clearance prohibitive of this at this time. Planning for cardiac cath on monday when creatinine improves.  - cardiology also following and assisting with management- on IV lasix for diuresis, stopped, so the creatnine can improve for the cath in am.   Dental caries - dental  medicine consulted for extraction prior to valve replacement. Plan for dental extraction next week after the cardiac cath.   HTN - BP was better controlled - increased Lopressor to 50 bid and added oral hydralazine- cont to follow at current doses. No more episodes of hypotension.   Poorly controlled diabetes - appears to have a poor understand about the need to control oral glucose/carb intake-  We have spoken with him the dietary restrictions he needs to follow Hemoglobin A1c 8.1. He reportsbeing a diabetic since the age of 76.  Patient admits to being noncompliant with medications He was on 28 units of 70/30 BID, but his cbg's have been borderline and i have decreased his insulin dose to half the dose.  CBG (last 3)   Recent Labs  01/23/15 0644 01/23/15 1123 01/23/15 1612  GLUCAP 122* 173* 169*       Acute renal failure Slightly worsened over the last 48 hours.  may be related to sepsis and diuresis-. Continue to monitor. His creatinine is around 2 .   Right shoulder- septic arthritis/ synovitis : worsened pain today. Better with pain meds.  - s/p aspiration by IR -ID team recommended I and D to resolve infection -  medical management recommended by ortho for now as taking him to the OR with his currently infected valve would be too high risk  Acute osteomyelitis of T3T4 - noted on MRI to have increased signal which may be osteomyelitis  Myositis L4-L5 area and entire left leg  - noted on MRI  Septic emboli to the  brain   Diarrhea C. difficile negative  Anemia: Normocytic and appears to be at baseline and stable.    Hypokalemia:  repleted as needed.    Code Status: full Family Communication: none at bedside Disposition Plan:  As above   Consultants:  Cardiothoracic surgery  Dental medicine  Infectious disease  cardiology  Procedures:  None  Antibiotics: Anti-infectives    Start     Dose/Rate Route Frequency Ordered Stop   01/19/15  2000  ampicillin (OMNIPEN) 2 g in sodium chloride 0.9 % 50 mL IVPB     2 g150 mL/hr over 20 Minutes Intravenous 6 times per day 01/19/15 1121     01/18/15 2000  penicillin G potassium 12 Million Units in dextrose 5 % 500 mL continuous infusion     12 Million Units41.7 mL/hr over 12 Hours Intravenous Every 12 hours 01/18/15 1432 01/19/15 2050   01/15/15 1600  nafcillin 2 g in dextrose 5 % 50 mL IVPB  Status:  Discontinued     2 g100 mL/hr over 30 Minutes Intravenous 6 times per day 01/15/15 1503 01/15/15 1517   01/15/15 1600  nafcillin 2 g in dextrose 5 % 50 mL IVPB     2 g100 mL/hr over 30 Minutes Intravenous 6 times per day 01/15/15 1517 01/18/15 1753   01/15/15 0930  nafcillin injection 2 g  Status:  Discontinued     2 g Intravenous Every 4 hours 01/15/15 0921 01/15/15 1503   01/14/15 1500  DAPTOmycin (CUBICIN) 600 mg in sodium chloride 0.9 % IVPB  Status:  Discontinued     600 mg224 mL/hr over 30 Minutes Intravenous Every 48 hours 01/12/15 1421 01/13/15 1239   01/13/15 1500  ceFAZolin (ANCEF) IVPB 2 g/50 mL premix  Status:  Discontinued     2 g100 mL/hr over 30 Minutes Intravenous 3 times per day 01/13/15 1407 01/15/15 0921   01/12/15 1600  ceFAZolin (ANCEF) IVPB 2 g/50 mL premix  Status:  Discontinued     2 g100 mL/hr over 30 Minutes Intravenous Every 12 hours 01/12/15 1419 01/13/15 1407   01/11/15 1200  DAPTOmycin (CUBICIN) 600 mg in sodium chloride 0.9 % IVPB  Status:  Discontinued     600 mg224 mL/hr over 30 Minutes Intravenous Every 24 hours 01/11/15 1003 01/12/15 1421   01/10/15 1200  vancomycin (VANCOCIN) IVPB 1000 mg/200 mL premix  Status:  Discontinued     1,000 mg200 mL/hr over 60 Minutes Intravenous Every 8 hours 01/10/15 1044 01/11/15 0947   01/10/15 1100  piperacillin-tazobactam (ZOSYN) IVPB 3.375 g  Status:  Discontinued     3.375 g12.5 mL/hr over 240 Minutes Intravenous Every 8 hours 01/10/15 1043 01/11/15 0902   01/09/15 0700  metroNIDAZOLE (FLAGYL) IVPB 500 mg  Status:   Discontinued     500 mg100 mL/hr over 60 Minutes Intravenous Every 8 hours 01/09/15 0651 01/10/15 1018       Objective: Filed Vitals:   01/22/15 1324 01/22/15 2040 01/23/15 0544 01/23/15 1424  BP: 123/66 165/83 137/73 133/79  Pulse: 75 89 88 71  Temp: 98.1 F (36.7 C) 98.2 F (36.8 C) 98.4 F (36.9 C) 98.2 F (36.8 C)  TempSrc: Oral Oral Oral Oral  Resp: 16 18 16 19   Height:      Weight:   109.907 kg (242 lb 4.8 oz)   SpO2: 100% 100% 97% 99%    Intake/Output Summary (Last 24 hours) at 01/23/15 1910 Last data filed at 01/23/15 1700  Gross per 24 hour  Intake  680 ml  Output   2725 ml  Net  -2045 ml    Exam:  General: alert  Not in any distress.  Cardiovascular: RRR, nl S1 s2 + murmur 2+ at right sternal border. Respiratory: Decreased breath sounds at the bases, scattered rhonchi, no crackles  Abdomen: soft +BS NT/ND, no masses palpable  Extremities: No cyanosis and no edema      Data Reviewed: Basic Metabolic Panel:  Recent Labs Lab 01/19/15 0321 01/20/15 0433 01/21/15 0418 01/22/15 1232 01/23/15 0307  NA 137 135 134* 133* 134*  K 3.4* 4.1 3.9 3.8 4.1  CL 104 97 95* 93* 95*  CO2 GLUCOSE 116* 184* 131* 97 164*  BUN 26*  CREATININE 2.20* 1.96* 2.12* 2.16* 2.27*  CALCIUM 7.9* 7.9* 8.0* 8.4 8.3*    Liver Function Tests:  Recent Labs Lab 01/17/15 0535 01/18/15 0357 01/19/15 0321  AST ALT ALKPHOS 209* 202* 185*  BILITOT 1.2 1.6* 0.9  PROT 6.0 6.5 5.8*  ALBUMIN 1.2* 1.3* 1.2*   No results for input(s): LIPASE, AMYLASE in the last 168 hours. No results for input(s): AMMONIA in the last 168 hours.  CBC:  Recent Labs Lab 01/18/15 0357 01/19/15 0321 01/20/15 0433 01/20/15 1602 01/21/15 0418 01/22/15 0347  WBC 20.2* 13.7* 14.3*  --  15.4* 10.9*  NEUTROABS  --   --  12.2* 11.7*  --   --   HGB 9.8* 8.9* 9.3*  --  8.7* 8.2*  HCT 29.6* 27.0* 28.6*  --  27.2* 25.9*  MCV 86.0 86.3 88.3  --   89.2 87.8  PLT 506* 434* 467*  --  458* 421*    Cardiac Enzymes:  Recent Labs Lab 01/19/15 0321  CKTOTAL 152   BNP (last 3 results) No results for input(s): PROBNP in the last 8760 hours.   CBG:  Recent Labs Lab 01/22/15 2109 01/22/15 2213 01/23/15 0644 01/23/15 1123 01/23/15 1612  GLUCAP 67* 99 122* 173* 169*    Imaging results reviewed in detail.    Scheduled Meds: . ampicillin (OMNIPEN) IV  2 g Intravenous 6 times per day  . chlorhexidine  15 mL Mouth/Throat BID  . feeding supplement (PRO-STAT SUGAR FREE 64)  30 mL Oral TID WC  . hydrALAZINE  25 mg Oral 3 times per day  . Influenza vac split quadrivalent PF  0.5 mL Intramuscular Tomorrow-1000  . insulin aspart  0-15 Units Subcutaneous QAC lunch  . insulin aspart  0-5 Units Subcutaneous QHS  . insulin aspart protamine- aspart  28 Units Subcutaneous BID WC  . living well with diabetes book   Does not apply Once  . metoprolol tartrate  50 mg Oral BID  . neomycin-bacitracin-polymyxin   Topical Daily  . nicotine  7 mg Transdermal Daily   Continuous Infusions:    Time spent: 30 minutes, which includes 50% of the time with face-to-face with patient and coordinating care related to the above assessment and plan.   [facility]  [facility] [phone] Pager-  [email or web address], password [facility] 01/23/2015, 7:10 PM  LOS: 14 days

## 2015-01-23 NOTE — Progress Notes (Signed)
Utilization review completed.  

## 2015-01-23 NOTE — Progress Notes (Addendum)
Consulting cardiologist: Dr. Kristeen Miss  Seen for followup:  Mitral valve endocarditis, severe mitral regurgitation  Subjective:    No shortness of breath at rest, no chest pain. Still reports limited range of motion in his right shoulder and pain with movement. Appetite OK.  Objective:   Temp:  [98.1 F (36.7 C)-98.4 F (36.9 C)] 98.4 F (36.9 C) (01/24 0544) Pulse Rate:  [75-89] 88 (01/24 0544) Resp:  [16-18] 16 (01/24 0544) BP: (123-165)/(66-83) 137/73 mmHg (01/24 0544) SpO2:  [97 %-100 %] 97 % (01/24 0544) Weight:  [242 lb 4.8 oz (109.907 kg)] 242 lb 4.8 oz (109.907 kg) (01/24 0544) Last BM Date: 01/22/15  Filed Weights   01/21/15 0500 01/22/15 0439 01/23/15 0544  Weight: 235 lb 8 oz (106.822 kg) 236 lb 12.4 oz (107.4 kg) 242 lb 4.8 oz (109.907 kg)    Intake/Output Summary (Last 24 hours) at 01/23/15 0939 Last data filed at 01/23/15 0700  Gross per 24 hour  Intake   1500 ml  Output   2325 ml  Net   -825 ml    Telemetry: Sinus rhythm.  Exam:  General: No distress.  Lungs: Clear, nonlabored  Cardiac: RRR with 2/6 apical systolic murmur.  Abdomen: NABS.  Extremities: No pitting edema.   Lab Results:  Basic Metabolic Panel:  Recent Labs Lab 01/21/15 0418 01/22/15 1232 01/23/15 0307  NA 134* 133* 134*  K 3.9 3.8 4.1  CL 95* 93* 95*  CO2 GLUCOSE 131* 97 164*  BUN 19 20 26*  CREATININE 2.12* 2.16* 2.27*  CALCIUM 8.0* 8.4 8.3*    Liver Function Tests:  Recent Labs Lab 01/17/15 0535 01/18/15 0357 01/19/15 0321  AST ALT ALKPHOS 209* 202* 185*  BILITOT 1.2 1.6* 0.9  PROT 6.0 6.5 5.8*  ALBUMIN 1.2* 1.3* 1.2*    CBC:  Recent Labs Lab 01/20/15 0433 01/21/15 0418 01/22/15 0347  WBC 14.3* 15.4* 10.9*  HGB 9.3* 8.7* 8.2*  HCT 28.6* 27.2* 25.9*  MCV 88.3 89.2 87.8  PLT 467* 458* 421*    Imaging: EXAM: CHEST - 1 VIEW  COMPARISON: 01/14/2015  FINDINGS: Cardiac shadow is mildly enlarged.  Bibasilar atelectatic changes and effusions are seen slightly increased from the prior exam. No other focal abnormality is noted.  IMPRESSION: Slight increase in bibasilar changes.   Medications:   Scheduled Medications: . ampicillin (OMNIPEN) IV  2 g Intravenous 6 times per day  . chlorhexidine  15 mL Mouth/Throat BID  . feeding supplement (PRO-STAT SUGAR FREE 64)  30 mL Oral TID WC  . furosemide  40 mg Oral Daily  . hydrALAZINE  25 mg Oral 3 times per day  . Influenza vac split quadrivalent PF  0.5 mL Intramuscular Tomorrow-1000  . insulin aspart  0-15 Units Subcutaneous QAC lunch  . insulin aspart  0-5 Units Subcutaneous QHS  . insulin aspart protamine- aspart  28 Units Subcutaneous BID WC  . living well with diabetes book   Does not apply Once  . metoprolol tartrate  50 mg Oral BID  . neomycin-bacitracin-polymyxin   Topical Daily  . nicotine  7 mg Transdermal Daily    PRN Medications: acetaminophen **OR** acetaminophen, alum & mag hydroxide-simeth, hydrALAZINE, HYDROmorphone (DILAUDID) injection, ondansetron **OR** ondansetron (ZOFRAN) IV, oxyCODONE   Assessment:   1. Mitral valve endocarditis, MSSA bacteremia documented. This is associated with an infectious aneurysm with perforation at the base of the medial scallop of the posterior  mitral leaflet and severe mitral regurgitation. Patient is on IV antibiotics, has been seen by Dr. Cornelius Moraswen. He is pending dental extractions and also probable heart catheterization next week in anticipation ultimately of valve surgery.  2. Diastolic heart failure, complicated by valvular heart disease.  3. Osteomyelitis of T3 and T4, also right shoulder septic arthritis/synovitis.  4. Evidence of septic emboli to brain.  5. Acute renal failure, creatinine up to 2.2 yesterday. Possibly embolic in etiology and also contributed to by diuresis.   Plan/Discussion:    Follow-up BMET in a.m. to reassess renal function. Will hold oral Lasix at this  time. Otherwise continue hydralazine and Lopressor. Tentative plans for coronary angiography this week presuming renal function stabilizes. He is on schedule for Monday, need to review BMET first.   Jonelle SidleSamuel G. Freya Zobrist, M.D., F.A.C.C.

## 2015-01-24 ENCOUNTER — Encounter (HOSPITAL_COMMUNITY): Payer: Self-pay | Admitting: Cardiology

## 2015-01-24 ENCOUNTER — Encounter (HOSPITAL_COMMUNITY)
Admission: EM | Disposition: A | Discharge status: Home Health | Payer: Self-pay | Source: Home / Self Care | Attending: Internal Medicine

## 2015-01-24 DIAGNOSIS — I739 Peripheral vascular disease, unspecified: Secondary | ICD-10-CM

## 2015-01-24 DIAGNOSIS — I76 Septic arterial embolism: Secondary | ICD-10-CM

## 2015-01-24 DIAGNOSIS — I119 Hypertensive heart disease without heart failure: Secondary | ICD-10-CM

## 2015-01-24 DIAGNOSIS — K056 Periodontal disease, unspecified: Secondary | ICD-10-CM

## 2015-01-24 HISTORY — PX: LEFT AND RIGHT HEART CATHETERIZATION WITH CORONARY ANGIOGRAM: SHX5449

## 2015-01-24 LAB — BASIC METABOLIC PANEL
ANION GAP: 5 (ref 5–15)
BUN: 22 mg/dL (ref 6–23)
CO2: 31 mmol/L (ref 19–32)
CREATININE: 2.1 mg/dL — AB (ref 0.50–1.35)
Calcium: 8.1 mg/dL — ABNORMAL LOW (ref 8.4–10.5)
Chloride: 100 mmol/L (ref 96–112)
GFR calc Af Amer: 46 mL/min — ABNORMAL LOW (ref >90)
GFR, EST NON AFRICAN AMERICAN: 39 mL/min — AB (ref >90)
Glucose, Bld: 198 mg/dL — ABNORMAL HIGH (ref 70–99)
Potassium: 4 mmol/L (ref 3.5–5.1)
Sodium: 136 mmol/L (ref 135–145)

## 2015-01-24 LAB — POCT I-STAT 3, VENOUS BLOOD GAS (G3P V)
Acid-Base Excess: 4 mmol/L — ABNORMAL HIGH (ref 0.0–2.0)
Bicarbonate: 29 mEq/L — ABNORMAL HIGH (ref 20.0–24.0)
O2 SAT: 49 %
PCO2 VEN: 46.6 mmHg (ref 45.0–50.0)
TCO2: 30 mmol/L (ref 0–100)
pH, Ven: 7.402 — ABNORMAL HIGH (ref 7.250–7.300)
pO2, Ven: 27 mmHg — CL (ref 30.0–45.0)

## 2015-01-24 LAB — POCT I-STAT 3, ART BLOOD GAS (G3+)
ACID-BASE EXCESS: 3 mmol/L — AB (ref 0.0–2.0)
Bicarbonate: 27.2 mEq/L — ABNORMAL HIGH (ref 20.0–24.0)
O2 SAT: 90 %
PH ART: 7.45 (ref 7.350–7.450)
TCO2: 28 mmol/L (ref 0–100)
pCO2 arterial: 39.1 mmHg (ref 35.0–45.0)
pO2, Arterial: 57 mmHg — ABNORMAL LOW (ref 80.0–100.0)

## 2015-01-24 LAB — CBC
HCT: 25 % — ABNORMAL LOW (ref 39.0–52.0)
HEMOGLOBIN: 7.7 g/dL — AB (ref 13.0–17.0)
MCH: 27.2 pg (ref 26.0–34.0)
MCHC: 30.8 g/dL (ref 30.0–36.0)
MCV: 88.3 fL (ref 78.0–100.0)
PLATELETS: 409 10*3/uL — AB (ref 150–400)
RBC: 2.83 MIL/uL — AB (ref 4.22–5.81)
RDW: 13.6 % (ref 11.5–15.5)
WBC: 10.1 10*3/uL (ref 4.0–10.5)

## 2015-01-24 LAB — GLUCOSE, CAPILLARY
GLUCOSE-CAPILLARY: 169 mg/dL — AB (ref 70–99)
GLUCOSE-CAPILLARY: 165 mg/dL — AB (ref 70–99)
Glucose-Capillary: 150 mg/dL — ABNORMAL HIGH (ref 70–99)
Glucose-Capillary: 193 mg/dL — ABNORMAL HIGH (ref 70–99)
Glucose-Capillary: 168 mg/dL — ABNORMAL HIGH (ref 70–99)

## 2015-01-24 SURGERY — LEFT AND RIGHT HEART CATHETERIZATION WITH CORONARY ANGIOGRAM
Anesthesia: LOCAL

## 2015-01-24 MED ORDER — SODIUM CHLORIDE 0.9 % IV SOLN: 1.0000 mL/kg/h | INTRAVENOUS | Status: DC

## 2015-01-24 MED ORDER — SODIUM CHLORIDE 0.9 % IV SOLN: 250.0000 mL | INTRAVENOUS | Status: DC | PRN

## 2015-01-24 MED ORDER — HEPARIN (PORCINE) IN NACL 2-0.9 UNIT/ML-% IJ SOLN
INTRAMUSCULAR | Status: AC
  Filled 2015-01-24: qty 500

## 2015-01-24 MED ORDER — SODIUM CHLORIDE 0.9 % IJ SOLN: 3.0000 mL | Freq: Two times a day (BID) | INTRAMUSCULAR | Status: DC

## 2015-01-24 MED ORDER — ASPIRIN 81 MG PO CHEW: 81.0000 mg | CHEWABLE_TABLET | ORAL | Status: DC

## 2015-01-24 MED ORDER — SODIUM CHLORIDE 0.9 % IV SOLN: INTRAVENOUS | Status: AC

## 2015-01-24 MED ORDER — SODIUM CHLORIDE 0.9 % IV SOLN: INTRAVENOUS | Status: DC

## 2015-01-24 MED ORDER — FENTANYL CITRATE 0.05 MG/ML IJ SOLN
INTRAMUSCULAR | Status: AC
  Administered 2015-01-25: 50 ug via INTRAVENOUS
  Filled 2015-01-24: qty 2

## 2015-01-24 MED ORDER — HYDRALAZINE HCL 20 MG/ML IJ SOLN
INTRAMUSCULAR | Status: AC
  Filled 2015-01-24: qty 1

## 2015-01-24 MED ORDER — MIDAZOLAM HCL 2 MG/2ML IJ SOLN
INTRAMUSCULAR | Status: AC
  Filled 2015-01-24: qty 2

## 2015-01-24 MED ORDER — SODIUM CHLORIDE 0.9 % IJ SOLN: 3.0000 mL | INTRAMUSCULAR | Status: DC | PRN

## 2015-01-24 MED ORDER — LIDOCAINE HCL (PF) 1 % IJ SOLN
INTRAMUSCULAR | Status: AC
  Filled 2015-01-24: qty 30

## 2015-01-24 NOTE — Progress Notes (Signed)
PRE-OPERATIVE NOTE:  01/24/2015 Dwayne HickmanBenjamin [name] [id number]  VITALS: BP 152/81 mmHg  Pulse 83  Temp(Src) 98.4 F (36.9 C) (Oral)  Resp 16  Ht 6\' 2"  (1.88 m)  Wt 243 lb 11.2 oz (110.542 kg)  BMI 31.28 kg/m2  SpO2 99%  Lab Results  Component Value Date   WBC 10.1 01/24/2015   HGB 7.7* 01/24/2015   HCT 25.0* 01/24/2015   MCV 88.3 01/24/2015   PLT 409* 01/24/2015   BMET    Component Value Date/Time   NA 136 01/24/2015 0450   K 4.0 01/24/2015 0450   CL 100 01/24/2015 0450   CO2 31 01/24/2015 0450   GLUCOSE 198* 01/24/2015 0450   BUN 22 01/24/2015 0450   CREATININE 2.10* 01/24/2015 0450   CREATININE 1.24 03/16/2014 1239   CALCIUM 8.1* 01/24/2015 0450   GFRNONAA 39* 01/24/2015 0450   GFRAA 46* 01/24/2015 0450    Lab Results  Component Value Date   INR 1.22 01/14/2015   No results found for: PTT   Dwayne HickmanBenjamin [name] is scheduled for multiple dental extractions with alveoloplasty and pre-prosthetic surgery as indicated along with gross debridement of remaining dentition on 01/25/2015. The patient is scheduled for cardiac catheterization later this afternoon.  SUBJECTIVE: The patient denies any acute dental changes and agrees to proceed with treatment as planned. We'll proceed with multiple dental extractions with alveoloplasty and gross debridement of remaining dentition. I will then assess possibility of bilateral mandibular tori reductions as time and patient condition allows.  EXAM: No sign of acute dental changes.  ASSESSMENT: Patient is affected by chronic apical periodontitis, multiple retained root segments, chronic periodontitis, accretions, multiple dental caries, and malocclusion.  PLAN: Patient agrees to proceed with treatment as planned in the operating room as previously discussed and accepts the risks, benefits, and complications of the proposed treatment. Patient is aware of the risk for bleeding, bruising, swelling, infection, pain, nerve damage,  soft tissue damage, sinus involvement, root tip fracture, mandible fracture, and the risks of complications associated with the anesthesia. Patient also is aware of the potential for other complications not mentioned above.   Charlynne Panderonald F. Kulinski, DDS

## 2015-01-24 NOTE — Progress Notes (Signed)
[facility] PROGRESS NOTE  Dwayne HickmanBenjamin Sublette ONG:[id number] DOB: 04/03/1980 DOA: 01/09/2015 PCP: No PCP Per Patient    Brief narrative: Dwayne [name] is a 35 y.o. male with a history of Uncontrolled HTN, DM2 who presents to the ED with complaints of diarrhea and fevers and chills x 24 hours. He reports having weakness and fatigue and He denies any nausea or vomiting. No one is sick at home, and he denies taking any recent antibiotic therapy. He reports that he has been out of his medications for months, and has not seen his PCP in months as well. he was found to have MSSA bacteremia and came in septic shock. He underwent TEE showing MV endocarditis. CTVs consulted and plan for surgery in the next week. Dental medicine consulted and plan for extraction in am. He underwent cardiac cath showing normal coronary angiography. HPI/Subjective: No chest pain or sob , persistent shoulder pain. Pain controlled with increased pain meds.  Wife at bedside and answered all questions.    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 Algis LimingVanDam recommends 8 weeks of antibiotics post heart surgery and all other potential surgeries - seen by Dr. Barry Dieneswens with CT surgery-- He would prefer the bacteremia to clear up first, however, he is concerned about the degree his MR worsened in 2 days between TTE and TEE. May need surgery probably next week before he goes in to heart failure.. - per Dr. Cornelius Moraswen, ideally need cardiac cath- Cr clearance prohibitive of this at this time. Planning for cardiac cath on monday when creatinine improves.  - cardiology also following and assisting with management- on IV lasix for diuresis, stopped, so the creatnine can improve for the cath. He underwent cardiac cath on 1/25 and was found to have  normal coronary anatomy with moderate pulmonary hypertension and with elevated LV filling pressures.  Dental caries - dental medicine consulted for extraction prior to valve replacement. Plan for dental extraction tomorrow.   HTN - BP was better controlled - increased Lopressor to 50 bid and added oral hydralazine- cont to follow at current doses. No more episodes of hypotension.   Poorly controlled diabetes - appears to have a poor understand about the need to control oral glucose/carb intake-  We have spoken with him the dietary restrictions he needs to follow Hemoglobin A1c 8.1. He reportsbeing a diabetic since the age of 35.  Patient admits to being noncompliant with medications He was on 28 units of 70/30 BID, but his cbg's have been borderline and i have decreased his insulin dose to half the dose.  CBG (last 3)   Recent Labs  01/24/15 0639 01/24/15 1116 01/24/15 1412  GLUCAP 150* 193* 169*       Acute renal failure Slightly worsened over the last 48 hours.  may be related to sepsis and diuresis-. Continue to monitor. His creatinine is around 2 .   Right shoulder- septic arthritis/ synovitis : worsened pain today. Better with pain meds.  - s/p aspiration by IR -ID team recommended I and D to resolve infection -  medical management recommended by ortho for now as taking him to the OR with his currently infected valve would be too high risk  Acute osteomyelitis of T3T4 - noted on MRI to have increased signal which may be osteomyelitis  Myositis L4-L5 area and entire left leg  - noted  on MRI  Septic emboli to the brain   Diarrhea C. difficile negative  Anemia: Normocytic and appears to be at baseline and stable.    Hypokalemia:  repleted as needed.    Code Status: full Family Communication: none at bedside Disposition Plan:  As above   Consultants:  Cardiothoracic surgery  Dental medicine  Infectious  disease  cardiology  Procedures:  None  Antibiotics: Anti-infectives    Start     Dose/Rate Route Frequency Ordered Stop   01/19/15 2000  ampicillin (OMNIPEN) 2 g in sodium chloride 0.9 % 50 mL IVPB     2 g150 mL/hr over 20 Minutes Intravenous 6 times per day 01/19/15 1121     01/18/15 2000  penicillin G potassium 12 Million Units in dextrose 5 % 500 mL continuous infusion     12 Million Units41.7 mL/hr over 12 Hours Intravenous Every 12 hours 01/18/15 1432 01/19/15 2050   01/15/15 1600  nafcillin 2 g in dextrose 5 % 50 mL IVPB  Status:  Discontinued     2 g100 mL/hr over 30 Minutes Intravenous 6 times per day 01/15/15 1503 01/15/15 1517   01/15/15 1600  nafcillin 2 g in dextrose 5 % 50 mL IVPB     2 g100 mL/hr over 30 Minutes Intravenous 6 times per day 01/15/15 1517 01/18/15 1753   01/15/15 0930  nafcillin injection 2 g  Status:  Discontinued     2 g Intravenous Every 4 hours 01/15/15 0921 01/15/15 1503   01/14/15 1500  DAPTOmycin (CUBICIN) 600 mg in sodium chloride 0.9 % IVPB  Status:  Discontinued     600 mg224 mL/hr over 30 Minutes Intravenous Every 48 hours 01/12/15 1421 01/13/15 1239   01/13/15 1500  ceFAZolin (ANCEF) IVPB 2 g/50 mL premix  Status:  Discontinued     2 g100 mL/hr over 30 Minutes Intravenous 3 times per day 01/13/15 1407 01/15/15 0921   01/12/15 1600  ceFAZolin (ANCEF) IVPB 2 g/50 mL premix  Status:  Discontinued     2 g100 mL/hr over 30 Minutes Intravenous Every 12 hours 01/12/15 1419 01/13/15 1407   01/11/15 1200  DAPTOmycin (CUBICIN) 600 mg in sodium chloride 0.9 % IVPB  Status:  Discontinued     600 mg224 mL/hr over 30 Minutes Intravenous Every 24 hours 01/11/15 1003 01/12/15 1421   01/10/15 1200  vancomycin (VANCOCIN) IVPB 1000 mg/200 mL premix  Status:  Discontinued     1,000 mg200 mL/hr over 60 Minutes Intravenous Every 8 hours 01/10/15 1044 01/11/15 0947   01/10/15 1100  piperacillin-tazobactam (ZOSYN) IVPB 3.375 g  Status:  Discontinued     3.375 g12.5  mL/hr over 240 Minutes Intravenous Every 8 hours 01/10/15 1043 01/11/15 0902   01/09/15 0700  metroNIDAZOLE (FLAGYL) IVPB 500 mg  Status:  Discontinued     500 mg100 mL/hr over 60 Minutes Intravenous Every 8 hours 01/09/15 0651 01/10/15 1018       Objective: Filed Vitals:   01/24/15 1435 01/24/15 1440 01/24/15 1445 01/24/15 1450  BP: 175/92 178/86 178/84 171/86  Pulse: 83 84 84 83  Temp:      TempSrc:      Resp: Height:      Weight:      SpO2: 94% 96% 96% 97%    Intake/Output Summary (Last 24 hours) at 01/24/15 1549 Last data filed at 01/24/15 1300  Gross per 24 hour  Intake    360 ml  Output   2250  ml  Net  -1890 ml    Exam:  General: alert  Not in any distress.  Cardiovascular: RRR, nl S1 s2 + murmur 2+ at right sternal border. Respiratory: Decreased breath sounds at the bases, scattered rhonchi, no crackles  Abdomen: soft +BS NT/ND, no masses palpable  Extremities: No cyanosis 3+ edema. Painful right shoulder on movement. Unable to raise the right shoulder.       Data Reviewed: Basic Metabolic Panel:  Recent Labs Lab 01/20/15 0433 01/21/15 0418 01/22/15 1232 01/23/15 0307 01/24/15 0450  NA 135 134* 133* 134* 136  K 4.1 3.9 3.8 4.1 4.0  CL 97 95* 93* 95* 100  CO2 GLUCOSE 184* 131* 97 164* 198*  BUN 26* 22  CREATININE 1.96* 2.12* 2.16* 2.27* 2.10*  CALCIUM 7.9* 8.0* 8.4 8.3* 8.1*    Liver Function Tests:  Recent Labs Lab 01/18/15 0357 01/19/15 0321  AST 22 17  ALT 16 13  ALKPHOS 202* 185*  BILITOT 1.6* 0.9  PROT 6.5 5.8*  ALBUMIN 1.3* 1.2*   No results for input(s): LIPASE, AMYLASE in the last 168 hours. No results for input(s): AMMONIA in the last 168 hours.  CBC:  Recent Labs Lab 01/19/15 0321 01/20/15 0433 01/20/15 1602 01/21/15 0418 01/22/15 0347 01/24/15 0450  WBC 13.7* 14.3*  --  15.4* 10.9* 10.1  NEUTROABS  --  12.2* 11.7*  --   --   --   HGB 8.9* 9.3*  --  8.7* 8.2* 7.7*  HCT 27.0*  28.6*  --  27.2* 25.9* 25.0*  MCV 86.3 88.3  --  89.2 87.8 88.3  PLT 434* 467*  --  458* 421* 409*    Cardiac Enzymes:  Recent Labs Lab 01/19/15 0321  CKTOTAL 152   BNP (last 3 results) No results for input(s): PROBNP in the last 8760 hours.   CBG:  Recent Labs Lab 01/23/15 1612 01/23/15 2200 01/24/15 0639 01/24/15 1116 01/24/15 1412  GLUCAP 169* 225* 150* 193* 169*    Imaging results reviewed in detail.    Scheduled Meds: . ampicillin (OMNIPEN) IV  2 g Intravenous 6 times per day  . aspirin  81 mg Oral Pre-Cath  . chlorhexidine  15 mL Mouth/Throat BID  . feeding supplement (PRO-STAT SUGAR FREE 64)  30 mL Oral TID WC  . hydrALAZINE  25 mg Oral 3 times per day  . Influenza vac split quadrivalent PF  0.5 mL Intramuscular Tomorrow-1000  . insulin aspart  0-15 Units Subcutaneous QAC lunch  . insulin aspart  0-5 Units Subcutaneous QHS  . insulin aspart protamine- aspart  14 Units Subcutaneous BID WC  . living well with diabetes book   Does not apply Once  . metoprolol tartrate  50 mg Oral BID  . neomycin-bacitracin-polymyxin   Topical Daily  . nicotine  7 mg Transdermal Daily  . sodium chloride  3 mL Intravenous Q12H   Continuous Infusions: . [START ON 01/25/2015] sodium chloride    . sodium chloride 600 mL (01/24/15 1425)    Time spent: 30 minutes, which includes 50% of the time with face-to-face with patient and coordinating care related to the above assessment and plan.   [facility]  [facility] [phone] Pager-  [email or web address], password [facility] 01/24/2015, 3:49 PM  LOS: 15 days

## 2015-01-24 NOTE — Progress Notes (Signed)
Site area: rfa/frv Site Prior to Removal:  Level 0 Pressure Applied For:6420min Manual:   yes Patient Status During Pull:  stable Post Pull Site:  Level0 Post Pull Instructions Given:  yes Post Pull Pulses Present: doppler Dressing Applied:  clear Bedrest begins @ 1450 Comments:

## 2015-01-24 NOTE — Progress Notes (Signed)
Cath lab staff was in this morning to see patient. Per patient, his girlfriend and night shift RN, patient was not going to the cath lab today due to kidney function.  RN made aware at 1305 that patient was going to the cath lab and staff was on the unit to transport patient. [facility] and consent was obtained at 1308. Patient prepped and sent with cath lab staff.

## 2015-01-24 NOTE — CV Procedure (Signed)
    Cardiac Catheterization Procedure Note  Name: Dwayne HickmanBenjamin Tufano MRN: [id number] DOB: 04/19/1980  Procedure: Right Heart Cath,   LV angiography  Indication: 35 yo BM with endocarditis of the MV with severe MR.   Procedural Details: The right groin was prepped, draped, and anesthetized with 1% lidocaine. Using the modified Seldinger technique a 5 Fr  sheath was placed in the right femoral artery and a 7 French sheath was placed in the right femoral vein. A Swan-Ganz catheter was used for the right heart catheterization. Standard protocol was followed for recording of right heart pressures and sampling of oxygen saturations. Fick cardiac output was calculated. Standard Judkins catheters were used for selective coronary angiography. There were no immediate procedural complications. The patient was transferred to the post catheterization recovery area for further monitoring. 40 cc of contrast used.  Procedural Findings: Hemodynamics RA 16/18 mean 12 mm Hg RV 57/18 mm Hg PA 56/22 mean 38 mm Hg PCWP 22/43 mean 24 mm Hg, large V wave LV Not done AO 151/95 mean 119 mm Hg  Oxygen saturations: PA 49% AO 90%  Cardiac Output (Fick) 7.3 L/min  Cardiac Index (Fick) 3.1 L/min/meter squared.   Coronary angiography: Coronary dominance: right  Left mainstem: Normal   Left anterior descending (LAD): Normal  Left circumflex (LCx): Normal  Right coronary artery (RCA): Normal.     Final Conclusions:   1. Normal coronary anatomy 2. Moderate pulmonary HTN 3. Elevated LV filling pressures with large V wave consistent with severe MR.  Recommendations: Continue medical therapy in anticipation of MV surgery.   Peter SwazilandJordan, MDFACC 01/24/2015, 1:59 PM

## 2015-01-24 NOTE — H&P (View-Only) (Signed)
Patient: Dwayne [name] / Admit Date: 01/09/2015 / Date of Encounter: 01/24/2015, 8:01 AM   Subjective: C/o feeling tired, right shoulder pain. Ate this morning about 7:30am.  ADDENDUM TO ROUNDING MD: Dr. Kristin Bruins wants an update of the plan for cath, pager [phone]. Tentatively the patient is scheduled for dental extraction tomorrow at 9:30am and should not receive any blood thinners prior to this procedure. He has not been on any pharmacologic DVT prophylaxis - remains on SCDS.  Objective: Telemetry: NSR Physical Exam: Blood pressure 152/81, pulse 83, temperature 98.4 F (36.9 C), temperature source Oral, resp. rate 16, height  (1.88 m), weight 243 lb 11.2 oz (110.542 kg), SpO2 99 %. General: Well developed tired appearing M in no acute distress. Head: Normocephalic, atraumatic, sclera non-icteric, no xanthomas, nares are without discharge. Neck: JVP not elevated. Lungs: Clear bilaterally to auscultation without wheezes, rales, or rhonchi. Breathing is unlabored. Heart: RRR S1 S2 with 2/6 systolic murmur at apex, no rubs, or gallops.  Abdomen: Soft, non-tender, non-distended with normoactive bowel sounds. No rebound/guarding. Extremities: 1+ BLE edema.  Neuro: A+Ox3 but appears tired. Psych:  Flat affect.   Intake/Output Summary (Last 24 hours) at 01/24/15 0801 Last data filed at 01/24/15 4540  Gross per 24 hour  Intake    240 ml  Output   2450 ml  Net  -2210 ml    Inpatient Medications:  . ampicillin (OMNIPEN) IV  2 g Intravenous 6 times per day  . chlorhexidine  15 mL Mouth/Throat BID  . feeding supplement (PRO-STAT SUGAR FREE 64)  30 mL Oral TID WC  . hydrALAZINE  25 mg Oral 3 times per day  . Influenza vac split quadrivalent PF  0.5 mL Intramuscular Tomorrow-1000  . insulin aspart  0-15 Units Subcutaneous QAC lunch  . insulin aspart  0-5 Units Subcutaneous QHS  . insulin aspart protamine- aspart  14 Units Subcutaneous BID WC  . living well with diabetes book    Does not apply Once  . metoprolol tartrate  50 mg Oral BID  . neomycin-bacitracin-polymyxin   Topical Daily  . nicotine  7 mg Transdermal Daily   Infusions:    Labs:  Recent Labs  01/23/15 0307 01/24/15 0450  NA 134* 136  K 4.1 4.0  CL 95* 100  CO2 25 31  GLUCOSE 164* 198*  BUN 26* 22  CREATININE 2.27* 2.10*  CALCIUM 8.3* 8.1*   No results for input(s): AST, ALT, ALKPHOS, BILITOT, PROT, ALBUMIN in the last 72 hours.  Recent Labs  01/22/15 0347 01/24/15 0450  WBC 10.9* 10.1  HGB 8.2* 7.7*  HCT 25.9* 25.0*  MCV 87.8 88.3  PLT 421* 409*   No results for input(s): CKTOTAL, CKMB, TROPONINI in the last 72 hours. Invalid input(s): POCBNP No results for input(s): HGBA1C in the last 72 hours.   Radiology/Studies:  Dg Orthopantogram  01/14/2015   CLINICAL DATA:  Staphylococcal endocarditis.  Poor dentition.  EXAM: ORTHOPANTOGRAM/PANORAMIC  COMPARISON:  None.  FINDINGS: Extensive dental disease with caries involving multiple teeth, including numbers 7, 8, 9, 10, 11, 12, 13, 14, 15, 16, 19, 20, 29, 30, 31 and 32. Periapical lucencies are present adjacent to teeth numbers 29 and 30.  IMPRESSION: Extensive dental disease as described.   Electronically Signed   By: Hulan Saas M.D.   On: 01/14/2015 21:31   Dg Chest 1 View  01/22/2015   CLINICAL DATA:  CHF, increasing right-sided chest pain  EXAM: CHEST - 1 VIEW  COMPARISON:  01/14/2015  FINDINGS: Cardiac shadow is mildly enlarged. Bibasilar atelectatic changes and effusions are seen slightly increased from the prior exam. No other focal abnormality is noted.  IMPRESSION: Slight increase in bibasilar changes.   Electronically Signed   By: Alcide Clever M.D.   On: 01/22/2015 09:57   Dg Chest 2 View  01/14/2015   CLINICAL DATA:  Right shoulder pain for 6 days.  EXAM: CHEST  2 VIEW  COMPARISON:  01/10/2015  FINDINGS: Two views of the chest demonstrate low lung volumes with bibasilar chest densities, left side greater than right. In  addition, the heart appears to be enlarged. The configuration of the heart raises the possibility for pericardial fluid. No evidence for pulmonary edema. Difficult to exclude small effusions, particularly on the left side.  IMPRESSION: Low lung volumes with bibasilar densities, left side greater than right. Basilar densities could represent a combination of atelectasis and pleural fluid.  Heart appears enlarged which may be accentuated by low lung volumes. Pericardial fluid cannot be excluded.   Electronically Signed   By: Richarda Overlie M.D.   On: 01/14/2015 08:17   Dg Shoulder Right  01/09/2015   CLINICAL DATA:  Anterior right shoulder pain  EXAM: RIGHT SHOULDER - 2+ VIEW  COMPARISON:  None.  FINDINGS: No fracture or dislocation is seen.  The joint spaces are preserved.  The visualized soft tissues are unremarkable.  Visualized right lung is clear.  IMPRESSION: No fracture or dislocation is seen.   Electronically Signed   By: Charline Bills M.D.   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  EXAM: MRI HEAD WITHOUT CONTRAST  TECHNIQUE: Multiplanar, multiecho pulse sequences of the brain and surrounding structures were obtained without intravenous contrast.  COMPARISON:  Prior study from 04/25/2012  FINDINGS: Cerebral volume within normal limits for patient age. No significant chronic small vessel ischemic changes identified. No mass lesion, midline shift, or mass effect. Ventricles are normal in size without evidence of hydrocephalus. No extra-axial fluid collection.  There are multiple subcentimeter ischemic infarcts involving predominantly the supratentorial brain. These involve both frontal lobes, the right parietal lobe, left temporal lobe, ischemic infarct within the right is cerebral hemisphere is located in the anterior right frontal lobe and measures 5 mm (series 3, image 36). The largest infarct in  the left cerebral hemisphere is located in the deep white matter of the anterior left frontal lobe and measures 13 mm (series 3, image 30). There are infarcts involving the bilateral basal ganglia, the most prominent of which located in the right thalamus which measures 4 mm. There is a single infratentorial infarct within the left cerebellar hemisphere measuring 4 mm. These are most likely embolic in nature given the history of endocarditis and multiple vascular distributions. Scattered susceptibility artifact seen associated with a few of these infarcts, likely reflecting small petechial hemorrhages. No frank hemorrhagic conversion There is associated T2/FLAIR signal intensity without significant mass effect.  Craniocervical junction within normal limits. Pituitary gland normal.  No acute abnormality seen about the orbits.  Paranasal sinuses are clear.  No mastoid effusion.  Bone marrow signal intensity within normal limits. Visualized upper cervical spine unremarkable.  Scalp soft tissues within normal limits.  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 These are likely embolic in nature given the history of endocarditis and involvement of multiple vascular distributions. No significant mass effect. There  are likely scattered small petechial hemorrhages with a number these infarcts without frank hemorrhage.   Electronically Signed   By: Rise Mu M.D.   On: 01/14/2015 21:24   Mr Thoracic Spine Wo Contrast  01/15/2015   ADDENDUM REPORT: 01/15/2015 14:40  ADDENDUM: Study discussed by telephone with Dr. Paulette Blanch DAM on 01/15/2015 at 1435 hrs.   Electronically Signed   By: Augusto [name] M.D.   On: 01/15/2015 14:40   01/15/2015   CLINICAL DATA:  35 year old male with sepsis and back pain. Into card itis, purulent pericarditis, multiple small embolic infarcts. Initial encounter.  EXAM: MRI THORACIC AND LUMBAR SPINE WITHOUT CONTRAST   TECHNIQUE: Multiplanar and multiecho pulse sequences of the thoracic and lumbar spine were obtained without intravenous contrast.  COMPARISON:  Chest radiographs 01/14/2015. CT Abdomen and Pelvis 05/18/2013.  FINDINGS: MR THORACIC SPINE FINDINGS  Limited sagittal imaging of the cervical spine is unremarkable.  Preserved thoracic vertebral height and alignment, preserved thoracic vertebral height and alignment.  Throughout much of the T3 and T4 vertebral bodies there is heterogeneous T2 signal as well as patchy pre STIR signal. The STIR images were repeated, an the same patchy increased signal persisted at T3 and T4.  No definite signal abnormality in the intervening disc, or adjacent disc spaces; there is widespread intervertebral disc T2 hyperintensity, as a normal finding seen in young patients.  No definite paraspinal or epidural inflammation at those levels.  No suspicious marrow signal elsewhere; intrinsic T1 and T2 hyperintensity at T6 is a benign vertebral body hemangioma.  However, there are layering pleural effusions left greater than right. Negative visualized upper abdominal viscera.  Epidural lipomatosis throughout the thoracic spine and effaces CSF from the thecal sac from the T5 to the T9 level. No definite spinal cord signal abnormality.  MR LUMBAR SPINE FINDINGS  In the lumbar spine T1-T2 and stir marrow signal appears diminished. This is also true in the visible pelvis. Possible associated decreased T2 signal in both the liver and spleen.  Otherwise negative visualized abdominal viscera.  There is mildly increased STIR signal in the lower lumbar erector spinae muscles bilaterally, at L4-L5 (series 17, images 9 and 1). Subcutaneous edema incidentally noted.  Normal lumbar vertebral height and alignment and no marrow edema or acute osseous abnormality in the lumbar spine.  No definite signal abnormality in the lower thoracic spinal cord, conus at L1-L2.  Lumbar facet hypertrophy.  No lumbar spinal  stenosis.  IMPRESSION: MR THORACIC SPINE IMPRESSION  1. Abnormal marrow edema in the T3 and T4 vertebral bodies highly suspicious for acute osteomyelitis in this setting. No associated epidural or paraspinal inflammation identified. 2. No other acute findings in the thoracic spine. Epidural lipomatosis. 3. Left greater than right layering pleural effusions.  MR LUMBAR SPINE IMPRESSION  1. Mild increased signal in the L4-L5 level erector spinae muscles bilaterally, which could be infectious myositis but also this appearance can be seen without infection in chronically debilitated patients. 2. No other acute finding in the lumbar spine. 3. Decreased bone marrow signal suggestive of red marrow conversion versus hemosiderosis.  Electronically Signed: By: Augusto [name] M.D. On: 01/15/2015 14:20   Mr Lumbar Spine Wo Contrast  01/15/2015   ADDENDUM REPORT: 01/15/2015 14:40  ADDENDUM: Study discussed by telephone with Dr. Paulette Blanch DAM on 01/15/2015 at 1435 hrs.   Electronically Signed   By: Augusto [name] M.D.   On: 01/15/2015 14:40   01/15/2015   CLINICAL DATA:  35 year old male with sepsis  and back pain. Into card itis, purulent pericarditis, multiple small embolic infarcts. Initial encounter.  EXAM: MRI THORACIC AND LUMBAR SPINE WITHOUT CONTRAST  TECHNIQUE: Multiplanar and multiecho pulse sequences of the thoracic and lumbar spine were obtained without intravenous contrast.  COMPARISON:  Chest radiographs 01/14/2015. CT Abdomen and Pelvis 05/18/2013.  FINDINGS: MR THORACIC SPINE FINDINGS  Limited sagittal imaging of the cervical spine is unremarkable.  Preserved thoracic vertebral height and alignment, preserved thoracic vertebral height and alignment.  Throughout much of the T3 and T4 vertebral bodies there is heterogeneous T2 signal as well as patchy pre STIR signal. The STIR images were repeated, an the same patchy increased signal persisted at T3 and T4.  No definite signal abnormality in the intervening disc, or  adjacent disc spaces; there is widespread intervertebral disc T2 hyperintensity, as a normal finding seen in young patients.  No definite paraspinal or epidural inflammation at those levels.  No suspicious marrow signal elsewhere; intrinsic T1 and T2 hyperintensity at T6 is a benign vertebral body hemangioma.  However, there are layering pleural effusions left greater than right. Negative visualized upper abdominal viscera.  Epidural lipomatosis throughout the thoracic spine and effaces CSF from the thecal sac from the T5 to the T9 level. No definite spinal cord signal abnormality.  MR LUMBAR SPINE FINDINGS  In the lumbar spine T1-T2 and stir marrow signal appears diminished. This is also true in the visible pelvis. Possible associated decreased T2 signal in both the liver and spleen.  Otherwise negative visualized abdominal viscera.  There is mildly increased STIR signal in the lower lumbar erector spinae muscles bilaterally, at L4-L5 (series 17, images 9 and 1). Subcutaneous edema incidentally noted.  Normal lumbar vertebral height and alignment and no marrow edema or acute osseous abnormality in the lumbar spine.  No definite signal abnormality in the lower thoracic spinal cord, conus at L1-L2.  Lumbar facet hypertrophy.  No lumbar spinal stenosis.  IMPRESSION: MR THORACIC SPINE IMPRESSION  1. Abnormal marrow edema in the T3 and T4 vertebral bodies highly suspicious for acute osteomyelitis in this setting. No associated epidural or paraspinal inflammation identified. 2. No other acute findings in the thoracic spine. Epidural lipomatosis. 3. Left greater than right layering pleural effusions.  MR LUMBAR SPINE IMPRESSION  1. Mild increased signal in the L4-L5 level erector spinae muscles bilaterally, which could be infectious myositis but also this appearance can be seen without infection in chronically debilitated patients. 2. No other acute finding in the lumbar spine. 3. Decreased bone marrow signal suggestive  of red marrow conversion versus hemosiderosis.  Electronically Signed: By: Augusto [name] M.D. On: 01/15/2015 14:20   US Renal  01/12/2015   CLINICAL DATA:  Acute renal failure  EXAM: RENAL/URINARY TRACT ULTRASOUND COMPLETE  COMPARISON:  None.  FINDINGS: Right Kidney:  Length: 12.8 cm in length. Diffuse increased cortical echogenicity suspicious for medical renal disease. No hydronephrosis. No renal calculus.  Left Kidney:  Length: . 12.6 cm in length. Diffuse increased echogenicity suspicious for medical renal disease. No hydronephrosis. No diagnostic renal calculus.  Bladder:  Appears normal for degree of bladder distention.  IMPRESSION: Bilateral kidneys shows diffuse increased echogenicity. Medical renal disease cannot be excluded. No hydronephrosis or diagnostic renal calculus.   Electronically Signed   By: Natasha Mead M.D.   On: 01/12/2015 16:08   Mr Shoulder Right Wo Contrast  01/11/2015   CLINICAL DATA:  Right shoulder pain.  EXAM: MRI OF THE RIGHT SHOULDER WITHOUT CONTRAST  TECHNIQUE: Multiplanar, multisequence  MR imaging of the shoulder was performed. No intravenous contrast was administered.  COMPARISON:  Radiographs dated 01/09/2015  FINDINGS: Rotator cuff:  Normal.  Muscles:  Normal.  Biceps long head: Properly located and intact. There is inflammation in the soft tissues around the bicipital tendon sheath with fluid in the bicipital tendon sheath.  Acromioclavicular Joint: Small degenerative cyst in the acromion. Otherwise normal AC joint. Type 1 acromion. No bursitis.  Glenohumeral Joint: There is a moderate glenohumeral joint effusion with extravasation of contrast from the subcoracoid recess of the joint into the adjacent soft tissues. Fluid distends the bicipital tendon sheath.  Labrum:  Intact.  Bones: No significant abnormalities. Tiny degenerative cystic areas in the posterior aspect of the greater tuberosity of the proximal humerus.  IMPRESSION: 1. Moderate glenohumeral joint effusion with some  extravasation of joint. Fluid distends the bicipital tendon sheath with inflammation/extravasated joint fluid around the bicipital tendon sheath. The possibly of synovitis should be considered. 2. Normal rotator cuff.   Electronically Signed   By: Geanie Cooley M.D.   On: 01/11/2015 11:21   Mr Tibia Fibula Right Wo Contrast  01/15/2015   CLINICAL DATA:  Diabetic patient with bacteremia and right leg pain.  EXAM: MRI OF LOWER RIGHT EXTREMITY WITHOUT CONTRAST  TECHNIQUE: Multiplanar, multisequence MR imaging of the right lower leg was performed. No intravenous contrast was administered.  COMPARISON:  None.  FINDINGS: The axial and coronal sequences incidentally include the left lower leg. There is marked subcutaneous edema of both lower legs which appears slightly worse on the left. No focal subcutaneous fluid collection is identified. Mildly increased T2 signal is seen in the gastrocnemius, soleus and and tibialis posterior muscles bilaterally which appears symmetric. No intramuscular fluid collection is identified. No bone marrow signal abnormality to suggest osteomyelitis is seen. There is no muscle or tendon tear.  IMPRESSION: Intense bilateral lower leg subcutaneous edema appears worse on the left and could be due to dependent change or cellulitis.  Mildly increased T2 signal in the gastrocnemius, soleus and tibialis posterior muscles bilaterally could be due to myositis or early changes of denervation atrophy in this diabetic patient.  Negative for abscess or osteomyelitis.   Electronically Signed   By: Drusilla Kanner M.D.   On: 01/15/2015 08:28   Mr Foot Right Wo Contrast  01/11/2015   CLINICAL DATA:  Diabetic foot ulcers.  EXAM: MRI OF THE RIGHT FOREFOOT WITHOUT CONTRAST  TECHNIQUE: Multiplanar, multisequence MR imaging was performed. No intravenous contrast was administered.  COMPARISON:  Radiographs dated 01/09/2011  FINDINGS: There are small erosions of the proximal phalanx at the IP joint of the  great toe and there are small erosions at the first metatarsal phalangeal joint, both consistent with gout. There are no soft tissue abscesses. There is no osteomyelitis. There is nonspecific edema in the subcutaneous fat of the dorsum of the foot. Muscles and tendons and ligaments appear appear normal. No joint effusions.  IMPRESSION: Subcutaneous edema primarily on the dorsum of the foot. 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   Electronically Signed   By: Geanie Cooley M.D.   On: 01/11/2015 12:50   Mr Femur Left Wo Contrast  01/12/2015   CLINICAL DATA:  Diabetes. Peripheral neuropathy. Sepsis. Bilateral thigh pain. RIGHT thigh pain. Tenderness in the medial aspect of the thigh.  EXAM: MR OF THE LEFT FEMUR WITHOUT CONTRAST  TECHNIQUE: Multiplanar, multisequence MR imaging was performed. No intravenous contrast was administered.  COMPARISON:  None.  FINDINGS: Diffuse subcutaneous  edema is present around the pelvis and in both thighs, most compatible with anasarca. This could also represent cellulitis in the appropriate clinical setting. Subcutaneous fluid is present in the lateral LEFT thigh, without a discrete abscess. There is myositis of the LEFT vastus lateralis with mild muscular edema av but no significant swelling. No fatty atrophy.  Bone marrow signal shows heterogenous marrow. This is a nonspecific finding most commonly associated with obesity, anemia, cigarette smoking or chronic disease. Negative for hip fracture or AVN. There is no evidence of osteomyelitis. No deep soft tissue abscesses are present.  Bilaterally in the groin, there are prominent lymph nodes which are probably reactive to lower extremity process.  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  IMPRESSION: 1. Diffuse subcutaneous edema in the pelvis and thighs bilaterally, suggesting anasarca. 2. More prominent subcutaneous edema along the LEFT thigh with subcutaneous  fluid superficial to the muscular fascia, likely representing cellulitis. No abscess. 3. Myositis of the proximal vastus lateralis, probably infectious. Reactive edema could produce this appearance as well. 4. Reactive inguinal adenopathy bilaterally. 5. Negative for osteomyelitis.   Electronically Signed   By: Andreas NewportGeoffrey  Lamke M.D.   On: 01/12/2015 14:33   Mr Foot Left Wo Contrast  01/11/2015   CLINICAL DATA:  Diabetic foot ulcers.  Foot pain.  Bony erosions.  EXAM: MRI OF THE LEFT FOREFOOT WITHOUT CONTRAST  TECHNIQUE: Multiplanar, multisequence MR imaging was performed. No intravenous contrast was administered.  COMPARISON:  Radiograph dated 01/09/2015  FINDINGS: There is slight edema throughout the medial cuneiform. There is artifactual increased signal from the metatarsal heads in from the proximal phalanx of the great toe. There are erosions at IP joint and first metatarsal phalangeal joint consistent with gout.  There are no soft tissue abscesses and there is no osteomyelitis. There is prominent edema in the subcutaneous fat of the dorsum of the foot.  IMPRESSION: No evidence of osteomyelitis or abscess. The edema in the medial cuneiform is most likely a stress reaction. 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   Electronically Signed   By: Geanie CooleyJim  Maxwell M.D.   On: 01/11/2015 12:15   Dg Chest Port 1 View  01/10/2015   CLINICAL DATA:  Fever and sweats.  EXAM: PORTABLE CHEST - 1 VIEW  COMPARISON:  None.  FINDINGS: Low lung volumes with vascular crowding and streaky subsegmental atelectasis. No obvious infiltrate or effusion. The heart is within normal limits in size given the AP portable technique. The bony structures are grossly normal.  IMPRESSION: Low lung volumes with vascular crowding and bibasilar atelectasis.   Electronically Signed   By: Loralie ChampagneMark  Gallerani M.D.   On: 01/10/2015 11:27   Dg Foot Complete Left  01/09/2015   CLINICAL DATA:  35 year old male with  recurrent bilateral foot pain.  EXAM: LEFT FOOT - COMPLETE 3+ VIEW  COMPARISON:  01/05/2014.  FINDINGS: Large periarticular erosions are noted adjacent to the first MTP joint, and at the first interphalangeal joint along the medial margin in both locations. These have erosions have sclerotic margins and overhanging edges, and there is adjacent soft tissue prominence, suggestive of gouty erosions. No adjacent soft tissue calcification is noted. No acute displaced fracture, subluxation or dislocation. Small heterotopic ossification adjacent to the plantar aspect of the calcaneus, likely ossification within the plantar fascia.  IMPRESSION: 1. Findings, as above, suggestive of underlying gout. Clinical correlation is recommended.   Electronically Signed  By: Trudie Reed M.D.   On: 01/09/2015 09:46   Dg Foot Complete Right  01/09/2015   CLINICAL DATA:  Recurring bilateral foot pain  EXAM: RIGHT FOOT COMPLETE - 3+ VIEW  COMPARISON:  None.  FINDINGS: No fracture or dislocation is seen.  Marginal erosion at the medial aspect of the 1st IP joint, involving the proximal phalanx.  The visualized soft tissues are unremarkable.  IMPRESSION: Marginal erosion at the medial aspect of the 1st IP joint. Correlate for inflammatory arthropathy such as gout.  Otherwise, no acute osseus abnormality is seen.   Electronically Signed   By: Charline Bills M.D.   On: 01/09/2015 09:44   Dg Fluoro Guide Ndl Plc/bx  01/14/2015   CLINICAL DATA:  Right shoulder pain and effusion.  Bacteremia.  EXAM: RIGHT SHOULDER ASPIRATION UNDER FLUOROSCOPY  FLUOROSCOPY TIME:  0 min 24 seconds  PROCEDURE: Overlying skin prepped with Betadine, draped in the usual sterile fashion, and infiltrated locally with buffered Lidocaine. Twenty gauge spinal needle was placed in the right glenohumeral joint under direct fluoroscopic visualization.  2 cc of bloody cloudy fluid was aspirated from the joint. Mr. Tipps tolerated the procedure well.   IMPRESSION: Technically successful right shoulder aspiration under fluoroscopy. Fluid sent for laboratory analysis.   Electronically Signed   By: Geanie Cooley M.D.   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 Other valves appear normal. Normal LV function. Moderate to severe LVH. - most recent TCTS input: favoring a 6 week course of antibiotics before proceding with mitral valve repair/replacement - however, due to persistent leukocytosis and pain in numerous places (presumably related to septic arthritis and myositis), he is probably not currently a candidate for outpatient therapy; mreover, the fact that the organism involved is Staphylococcus aureus he still has the potential to deteriorate clinically without much warning - would need dental extraction per dental consult note prior to valvular surgery - ID also following - on ampicillin - will discuss timing of cath with MD given plateaued creatinine and continually declining anemia - his orders were not released over the weekend so he ate this AM, but will keep NPO from this point forward - risks and benefits of cardiac catheterization have been discussed with the patient- these include bleeding, infection, kidney damage, stroke, heart attack, death.   The patient understands these risks and is willing to proceed.  2. Elevated troponin - likely related to acute sepsis, overall illness - to be evaluated with cath  3. Pericarditis with small fibrinous pericardial effusion by TEE 01/13/15 - Dr. Anne Fu considered evaluation by TCTS for pericardial stripping as he felt this may be challenging to tx with abx alone - follow clinically  4. 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 Acute renal insufficiency - ? CKD - Cr was 1.37 in 11/2013 - this admission: Cr peak 3.15, possibly embolic in etiology  - renal function has plateauted around the 2 range - renal US 1/13: increased echogenicity, cannot exclude medical renal disease; no other acute findings - nephrology following  6. 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 Edilia Bo did not feel there was any utility in a more aggressive workup of his decreased ABI on the left - 01/15/15: LLE venous duplex neg for DVT  7. 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 Hyponatremia 10. Hypoalbuminemia 11. Hypokalemia 12. Worsening anemia with thrombocytosis, per IM  Signed, Dayna Dunn PA-C  Agree with note by Ronie Spies PA-C  Complex and unfortunate AAM with MSSA SBE/bacteremia/sepsis with MV involvement and severe MR. 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 On IV ATBX per ID. Pt ate this AM but is NPO now. I have gone over the severity of his illness and the need for diagnostic R/L heart cath (limited contrast, radial, cors only). He is being considered for MVR (he also has a small fibrinous pericardial effusion ? Infectious). I have reviewed the procedure with the patient and he agrees to proceed later this PM.  Runell Gess, M.D., FACP, [facility], [facility] [address] [place], [place]  [address]  [phone] 01/24/2015 10:40 AM

## 2015-01-24 NOTE — Progress Notes (Addendum)
Patient: Dwayne [name] / Admit Date: 01/09/2015 / Date of Encounter: 01/24/2015, 8:01 AM   Subjective: C/o feeling tired, right shoulder pain. Ate this morning about 7:30am.  ADDENDUM TO ROUNDING MD: Dr. Kulinski wants an update of the plan for cath, pager [phone]. Tentatively the patient is scheduled for dental extraction tomorrow at 9:30am and should not receive any blood thinners prior to this procedure. He has not been on any pharmacologic DVT prophylaxis - remains on SCDS.  Objective: Telemetry: NSR Physical Exam: Blood pressure 152/81, pulse 83, temperature 98.4 F (36.9 C), temperature source Oral, resp. rate 16, height 6' 2" (1.88 m), weight 243 lb 11.2 oz (110.542 kg), SpO2 99 %. General: Well developed tired appearing M in no acute distress. Head: Normocephalic, atraumatic, sclera non-icteric, no xanthomas, nares are without discharge. Neck: JVP not elevated. Lungs: Clear bilaterally to auscultation without wheezes, rales, or rhonchi. Breathing is unlabored. Heart: RRR S1 S2 with 2/6 systolic murmur at apex, no rubs, or gallops.  Abdomen: Soft, non-tender, non-distended with normoactive bowel sounds. No rebound/guarding. Extremities: 1+ BLE edema.  Neuro: A+Ox3 but appears tired. Psych:  Flat affect.   Intake/Output Summary (Last 24 hours) at 01/24/15 0801 Last data filed at 01/24/15 0647  Gross per 24 hour  Intake    240 ml  Output   2450 ml  Net  -2210 ml    Inpatient Medications:  . ampicillin (OMNIPEN) IV  2 g Intravenous 6 times per day  . chlorhexidine  15 mL Mouth/Throat BID  . feeding supplement (PRO-STAT SUGAR FREE 64)  30 mL Oral TID WC  . hydrALAZINE  25 mg Oral 3 times per day  . Influenza vac split quadrivalent PF  0.5 mL Intramuscular Tomorrow-1000  . insulin aspart  0-15 Units Subcutaneous QAC lunch  . insulin aspart  0-5 Units Subcutaneous QHS  . insulin aspart protamine- aspart  14 Units Subcutaneous BID WC  . living well with diabetes book    Does not apply Once  . metoprolol tartrate  50 mg Oral BID  . neomycin-bacitracin-polymyxin   Topical Daily  . nicotine  7 mg Transdermal Daily   Infusions:    Labs:  Recent Labs  01/23/15 0307 01/24/15 0450  NA 134* 136  K 4.1 4.0  CL 95* 100  CO2 25 31  GLUCOSE 164* 198*  BUN 26* 22  CREATININE 2.27* 2.10*  CALCIUM 8.3* 8.1*   No results for input(s): AST, ALT, ALKPHOS, BILITOT, PROT, ALBUMIN in the last 72 hours.  Recent Labs  01/22/15 0347 01/24/15 0450  WBC 10.9* 10.1  HGB 8.2* 7.7*  HCT 25.9* 25.0*  MCV 87.8 88.3  PLT 421* 409*   No results for input(s): CKTOTAL, CKMB, TROPONINI in the last 72 hours. Invalid input(s): POCBNP No results for input(s): HGBA1C in the last 72 hours.   Radiology/Studies:  Dg Orthopantogram  01/14/2015   CLINICAL DATA:  Staphylococcal endocarditis.  Poor dentition.  EXAM: ORTHOPANTOGRAM/PANORAMIC  COMPARISON:  None.  FINDINGS: Extensive dental disease with caries involving multiple teeth, including numbers 7, 8, 9, 10, 11, 12, 13, 14, 15, 16, 19, 20, 29, 30, 31 and 32. Periapical lucencies are present adjacent to teeth numbers 29 and 30.  IMPRESSION: Extensive dental disease as described.   Electronically Signed   By: Thomas  Lawrence M.D.   On: 01/14/2015 21:31   Dg Chest 1 View  01/22/2015   CLINICAL DATA:  CHF, increasing right-sided chest pain  EXAM: CHEST - 1 VIEW  COMPARISON:  01/14/2015    FINDINGS: Cardiac shadow is mildly enlarged. Bibasilar atelectatic changes and effusions are seen slightly increased from the prior exam. No other focal abnormality is noted.  IMPRESSION: Slight increase in bibasilar changes.   Electronically Signed   By: Mark  Lukens M.D.   On: 01/22/2015 09:57   Dg Chest 2 View  01/14/2015   CLINICAL DATA:  Right shoulder pain for 6 days.  EXAM: CHEST  2 VIEW  COMPARISON:  01/10/2015  FINDINGS: Two views of the chest demonstrate low lung volumes with bibasilar chest densities, left side greater than right. In  addition, the heart appears to be enlarged. The configuration of the heart raises the possibility for pericardial fluid. No evidence for pulmonary edema. Difficult to exclude small effusions, particularly on the left side.  IMPRESSION: Low lung volumes with bibasilar densities, left side greater than right. Basilar densities could represent a combination of atelectasis and pleural fluid.  Heart appears enlarged which may be accentuated by low lung volumes. Pericardial fluid cannot be excluded.   Electronically Signed   By: Adam  Henn M.D.   On: 01/14/2015 08:17   Dg Shoulder Right  01/09/2015   CLINICAL DATA:  Anterior right shoulder pain  EXAM: RIGHT SHOULDER - 2+ VIEW  COMPARISON:  None.  FINDINGS: No fracture or dislocation is seen.  The joint spaces are preserved.  The visualized soft tissues are unremarkable.  Visualized right lung is clear.  IMPRESSION: No fracture or dislocation is seen.   Electronically Signed   By: Sriyesh  Krishnan M.D.   On: 01/09/2015 09:44   Mr Brain Wo Contrast  01/14/2015   CLINICAL DATA:  34-year-old male with history of staph aureus bacteremia and endocarditis.  EXAM: MRI HEAD WITHOUT CONTRAST  TECHNIQUE: Multiplanar, multiecho pulse sequences of the brain and surrounding structures were obtained without intravenous contrast.  COMPARISON:  Prior study from 04/25/2012  FINDINGS: Cerebral volume within normal limits for patient age. No significant chronic small vessel ischemic changes identified. No mass lesion, midline shift, or mass effect. Ventricles are normal in size without evidence of hydrocephalus. No extra-axial fluid collection.  There are multiple subcentimeter ischemic infarcts involving predominantly the supratentorial brain. These involve both frontal lobes, the right parietal lobe, left temporal lobe, ischemic infarct within the right is cerebral hemisphere is located in the anterior right frontal lobe and measures 5 mm (series 3, image 36). The largest infarct in  the left cerebral hemisphere is located in the deep white matter of the anterior left frontal lobe and measures 13 mm (series 3, image 30). There are infarcts involving the bilateral basal ganglia, the most prominent of which located in the right thalamus which measures 4 mm. There is a single infratentorial infarct within the left cerebellar hemisphere measuring 4 mm. These are most likely embolic in nature given the history of endocarditis and multiple vascular distributions. Scattered susceptibility artifact seen associated with a few of these infarcts, likely reflecting small petechial hemorrhages. No frank hemorrhagic conversion There is associated T2/FLAIR signal intensity without significant mass effect.  Craniocervical junction within normal limits. Pituitary gland normal.  No acute abnormality seen about the orbits.  Paranasal sinuses are clear.  No mastoid effusion.  Bone marrow signal intensity within normal limits. Visualized upper cervical spine unremarkable.  Scalp soft tissues within normal limits.  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 These are likely embolic in nature given the history of endocarditis and involvement of multiple vascular distributions. No significant mass effect. There   are likely scattered small petechial hemorrhages with a number these infarcts without frank hemorrhage.   Electronically Signed   By: Vennie  McClintock M.D.   On: 01/14/2015 21:24   Mr Thoracic Spine Wo Contrast  01/15/2015   ADDENDUM REPORT: 01/15/2015 14:40  ADDENDUM: Study discussed by telephone with Dr. CORNELIUS VAN DAM on 01/15/2015 at 1435 hrs.   Electronically Signed   By: Lee  Hall M.D.   On: 01/15/2015 14:40   01/15/2015   CLINICAL DATA:  34-year-old male with sepsis and back pain. Into card itis, purulent pericarditis, multiple small embolic infarcts. Initial encounter.  EXAM: MRI THORACIC AND LUMBAR SPINE WITHOUT CONTRAST   TECHNIQUE: Multiplanar and multiecho pulse sequences of the thoracic and lumbar spine were obtained without intravenous contrast.  COMPARISON:  Chest radiographs 01/14/2015. CT Abdomen and Pelvis 05/18/2013.  FINDINGS: MR THORACIC SPINE FINDINGS  Limited sagittal imaging of the cervical spine is unremarkable.  Preserved thoracic vertebral height and alignment, preserved thoracic vertebral height and alignment.  Throughout much of the T3 and T4 vertebral bodies there is heterogeneous T2 signal as well as patchy pre STIR signal. The STIR images were repeated, an the same patchy increased signal persisted at T3 and T4.  No definite signal abnormality in the intervening disc, or adjacent disc spaces; there is widespread intervertebral disc T2 hyperintensity, as a normal finding seen in young patients.  No definite paraspinal or epidural inflammation at those levels.  No suspicious marrow signal elsewhere; intrinsic T1 and T2 hyperintensity at T6 is a benign vertebral body hemangioma.  However, there are layering pleural effusions left greater than right. Negative visualized upper abdominal viscera.  Epidural lipomatosis throughout the thoracic spine and effaces CSF from the thecal sac from the T5 to the T9 level. No definite spinal cord signal abnormality.  MR LUMBAR SPINE FINDINGS  In the lumbar spine T1-T2 and stir marrow signal appears diminished. This is also true in the visible pelvis. Possible associated decreased T2 signal in both the liver and spleen.  Otherwise negative visualized abdominal viscera.  There is mildly increased STIR signal in the lower lumbar erector spinae muscles bilaterally, at L4-L5 (series 17, images 9 and 1). Subcutaneous edema incidentally noted.  Normal lumbar vertebral height and alignment and no marrow edema or acute osseous abnormality in the lumbar spine.  No definite signal abnormality in the lower thoracic spinal cord, conus at L1-L2.  Lumbar facet hypertrophy.  No lumbar spinal  stenosis.  IMPRESSION: MR THORACIC SPINE IMPRESSION  1. Abnormal marrow edema in the T3 and T4 vertebral bodies highly suspicious for acute osteomyelitis in this setting. No associated epidural or paraspinal inflammation identified. 2. No other acute findings in the thoracic spine. Epidural lipomatosis. 3. Left greater than right layering pleural effusions.  MR LUMBAR SPINE IMPRESSION  1. Mild increased signal in the L4-L5 level erector spinae muscles bilaterally, which could be infectious myositis but also this appearance can be seen without infection in chronically debilitated patients. 2. No other acute finding in the lumbar spine. 3. Decreased bone marrow signal suggestive of red marrow conversion versus hemosiderosis.  Electronically Signed: By: Lee  Hall M.D. On: 01/15/2015 14:20   Mr Lumbar Spine Wo Contrast  01/15/2015   ADDENDUM REPORT: 01/15/2015 14:40  ADDENDUM: Study discussed by telephone with Dr. CORNELIUS VAN DAM on 01/15/2015 at 1435 hrs.   Electronically Signed   By: Lee  Hall M.D.   On: 01/15/2015 14:40   01/15/2015   CLINICAL DATA:  34-year-old male with sepsis   and back pain. Into card itis, purulent pericarditis, multiple small embolic infarcts. Initial encounter.  EXAM: MRI THORACIC AND LUMBAR SPINE WITHOUT CONTRAST  TECHNIQUE: Multiplanar and multiecho pulse sequences of the thoracic and lumbar spine were obtained without intravenous contrast.  COMPARISON:  Chest radiographs 01/14/2015. CT Abdomen and Pelvis 05/18/2013.  FINDINGS: MR THORACIC SPINE FINDINGS  Limited sagittal imaging of the cervical spine is unremarkable.  Preserved thoracic vertebral height and alignment, preserved thoracic vertebral height and alignment.  Throughout much of the T3 and T4 vertebral bodies there is heterogeneous T2 signal as well as patchy pre STIR signal. The STIR images were repeated, an the same patchy increased signal persisted at T3 and T4.  No definite signal abnormality in the intervening disc, or  adjacent disc spaces; there is widespread intervertebral disc T2 hyperintensity, as a normal finding seen in young patients.  No definite paraspinal or epidural inflammation at those levels.  No suspicious marrow signal elsewhere; intrinsic T1 and T2 hyperintensity at T6 is a benign vertebral body hemangioma.  However, there are layering pleural effusions left greater than right. Negative visualized upper abdominal viscera.  Epidural lipomatosis throughout the thoracic spine and effaces CSF from the thecal sac from the T5 to the T9 level. No definite spinal cord signal abnormality.  MR LUMBAR SPINE FINDINGS  In the lumbar spine T1-T2 and stir marrow signal appears diminished. This is also true in the visible pelvis. Possible associated decreased T2 signal in both the liver and spleen.  Otherwise negative visualized abdominal viscera.  There is mildly increased STIR signal in the lower lumbar erector spinae muscles bilaterally, at L4-L5 (series 17, images 9 and 1). Subcutaneous edema incidentally noted.  Normal lumbar vertebral height and alignment and no marrow edema or acute osseous abnormality in the lumbar spine.  No definite signal abnormality in the lower thoracic spinal cord, conus at L1-L2.  Lumbar facet hypertrophy.  No lumbar spinal stenosis.  IMPRESSION: MR THORACIC SPINE IMPRESSION  1. Abnormal marrow edema in the T3 and T4 vertebral bodies highly suspicious for acute osteomyelitis in this setting. No associated epidural or paraspinal inflammation identified. 2. No other acute findings in the thoracic spine. Epidural lipomatosis. 3. Left greater than right layering pleural effusions.  MR LUMBAR SPINE IMPRESSION  1. Mild increased signal in the L4-L5 level erector spinae muscles bilaterally, which could be infectious myositis but also this appearance can be seen without infection in chronically debilitated patients. 2. No other acute finding in the lumbar spine. 3. Decreased bone marrow signal suggestive  of red marrow conversion versus hemosiderosis.  Electronically Signed: By: Lee  Hall M.D. On: 01/15/2015 14:20   Us Renal  01/12/2015   CLINICAL DATA:  Acute renal failure  EXAM: RENAL/URINARY TRACT ULTRASOUND COMPLETE  COMPARISON:  None.  FINDINGS: Right Kidney:  Length: 12.8 cm in length. Diffuse increased cortical echogenicity suspicious for medical renal disease. No hydronephrosis. No renal calculus.  Left Kidney:  Length: . 12.6 cm in length. Diffuse increased echogenicity suspicious for medical renal disease. No hydronephrosis. No diagnostic renal calculus.  Bladder:  Appears normal for degree of bladder distention.  IMPRESSION: Bilateral kidneys shows diffuse increased echogenicity. Medical renal disease cannot be excluded. No hydronephrosis or diagnostic renal calculus.   Electronically Signed   By: Liviu  Pop M.D.   On: 01/12/2015 16:08   Mr Shoulder Right Wo Contrast  01/11/2015   CLINICAL DATA:  Right shoulder pain.  EXAM: MRI OF THE RIGHT SHOULDER WITHOUT CONTRAST  TECHNIQUE: Multiplanar, multisequence   MR imaging of the shoulder was performed. No intravenous contrast was administered.  COMPARISON:  Radiographs dated 01/09/2015  FINDINGS: Rotator cuff:  Normal.  Muscles:  Normal.  Biceps long head: Properly located and intact. There is inflammation in the soft tissues around the bicipital tendon sheath with fluid in the bicipital tendon sheath.  Acromioclavicular Joint: Small degenerative cyst in the acromion. Otherwise normal AC joint. Type 1 acromion. No bursitis.  Glenohumeral Joint: There is a moderate glenohumeral joint effusion with extravasation of contrast from the subcoracoid recess of the joint into the adjacent soft tissues. Fluid distends the bicipital tendon sheath.  Labrum:  Intact.  Bones: No significant abnormalities. Tiny degenerative cystic areas in the posterior aspect of the greater tuberosity of the proximal humerus.  IMPRESSION: 1. Moderate glenohumeral joint effusion with some  extravasation of joint. Fluid distends the bicipital tendon sheath with inflammation/extravasated joint fluid around the bicipital tendon sheath. The possibly of synovitis should be considered. 2. Normal rotator cuff.   Electronically Signed   By: Jim  Maxwell M.D.   On: 01/11/2015 11:21   Mr Tibia Fibula Right Wo Contrast  01/15/2015   CLINICAL DATA:  Diabetic patient with bacteremia and right leg pain.  EXAM: MRI OF LOWER RIGHT EXTREMITY WITHOUT CONTRAST  TECHNIQUE: Multiplanar, multisequence MR imaging of the right lower leg was performed. No intravenous contrast was administered.  COMPARISON:  None.  FINDINGS: The axial and coronal sequences incidentally include the left lower leg. There is marked subcutaneous edema of both lower legs which appears slightly worse on the left. No focal subcutaneous fluid collection is identified. Mildly increased T2 signal is seen in the gastrocnemius, soleus and and tibialis posterior muscles bilaterally which appears symmetric. No intramuscular fluid collection is identified. No bone marrow signal abnormality to suggest osteomyelitis is seen. There is no muscle or tendon tear.  IMPRESSION: Intense bilateral lower leg subcutaneous edema appears worse on the left and could be due to dependent change or cellulitis.  Mildly increased T2 signal in the gastrocnemius, soleus and tibialis posterior muscles bilaterally could be due to myositis or early changes of denervation atrophy in this diabetic patient.  Negative for abscess or osteomyelitis.   Electronically Signed   By: Thomas  Dalessio M.D.   On: 01/15/2015 08:28   Mr Foot Right Wo Contrast  01/11/2015   CLINICAL DATA:  Diabetic foot ulcers.  EXAM: MRI OF THE RIGHT FOREFOOT WITHOUT CONTRAST  TECHNIQUE: Multiplanar, multisequence MR imaging was performed. No intravenous contrast was administered.  COMPARISON:  Radiographs dated 01/09/2011  FINDINGS: There are small erosions of the proximal phalanx at the IP joint of the  great toe and there are small erosions at the first metatarsal phalangeal joint, both consistent with gout. There are no soft tissue abscesses. There is no osteomyelitis. There is nonspecific edema in the subcutaneous fat of the dorsum of the foot. Muscles and tendons and ligaments appear appear normal. No joint effusions.  IMPRESSION: Subcutaneous edema primarily on the dorsum of the foot. 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   Electronically Signed   By: Jim  Maxwell M.D.   On: 01/11/2015 12:50   Mr Femur Left Wo Contrast  01/12/2015   CLINICAL DATA:  Diabetes. Peripheral neuropathy. Sepsis. Bilateral thigh pain. RIGHT thigh pain. Tenderness in the medial aspect of the thigh.  EXAM: MR OF THE LEFT FEMUR WITHOUT CONTRAST  TECHNIQUE: Multiplanar, multisequence MR imaging was performed. No intravenous contrast was administered.  COMPARISON:  None.  FINDINGS: Diffuse subcutaneous   edema is present around the pelvis and in both thighs, most compatible with anasarca. This could also represent cellulitis in the appropriate clinical setting. Subcutaneous fluid is present in the lateral LEFT thigh, without a discrete abscess. There is myositis of the LEFT vastus lateralis with mild muscular edema av but no significant swelling. No fatty atrophy.  Bone marrow signal shows heterogenous marrow. This is a nonspecific finding most commonly associated with obesity, anemia, cigarette smoking or chronic disease. Negative for hip fracture or AVN. There is no evidence of osteomyelitis. No deep soft tissue abscesses are present.  Bilaterally in the groin, there are prominent lymph nodes which are probably reactive to lower extremity process.  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  IMPRESSION: 1. Diffuse subcutaneous edema in the pelvis and thighs bilaterally, suggesting anasarca. 2. More prominent subcutaneous edema along the LEFT thigh with subcutaneous  fluid superficial to the muscular fascia, likely representing cellulitis. No abscess. 3. Myositis of the proximal vastus lateralis, probably infectious. Reactive edema could produce this appearance as well. 4. Reactive inguinal adenopathy bilaterally. 5. Negative for osteomyelitis.   Electronically Signed   By: Geoffrey  Lamke M.D.   On: 01/12/2015 14:33   Mr Foot Left Wo Contrast  01/11/2015   CLINICAL DATA:  Diabetic foot ulcers.  Foot pain.  Bony erosions.  EXAM: MRI OF THE LEFT FOREFOOT WITHOUT CONTRAST  TECHNIQUE: Multiplanar, multisequence MR imaging was performed. No intravenous contrast was administered.  COMPARISON:  Radiograph dated 01/09/2015  FINDINGS: There is slight edema throughout the medial cuneiform. There is artifactual increased signal from the metatarsal heads in from the proximal phalanx of the great toe. There are erosions at IP joint and first metatarsal phalangeal joint consistent with gout.  There are no soft tissue abscesses and there is no osteomyelitis. There is prominent edema in the subcutaneous fat of the dorsum of the foot.  IMPRESSION: No evidence of osteomyelitis or abscess. The edema in the medial cuneiform is most likely a stress reaction. 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   Electronically Signed   By: Jim  Maxwell M.D.   On: 01/11/2015 12:15   Dg Chest Port 1 View  01/10/2015   CLINICAL DATA:  Fever and sweats.  EXAM: PORTABLE CHEST - 1 VIEW  COMPARISON:  None.  FINDINGS: Low lung volumes with vascular crowding and streaky subsegmental atelectasis. No obvious infiltrate or effusion. The heart is within normal limits in size given the AP portable technique. The bony structures are grossly normal.  IMPRESSION: Low lung volumes with vascular crowding and bibasilar atelectasis.   Electronically Signed   By: Mark  Gallerani M.D.   On: 01/10/2015 11:27   Dg Foot Complete Left  01/09/2015   CLINICAL DATA:  34-year-old male with  recurrent bilateral foot pain.  EXAM: LEFT FOOT - COMPLETE 3+ VIEW  COMPARISON:  01/05/2014.  FINDINGS: Large periarticular erosions are noted adjacent to the first MTP joint, and at the first interphalangeal joint along the medial margin in both locations. These have erosions have sclerotic margins and overhanging edges, and there is adjacent soft tissue prominence, suggestive of gouty erosions. No adjacent soft tissue calcification is noted. No acute displaced fracture, subluxation or dislocation. Small heterotopic ossification adjacent to the plantar aspect of the calcaneus, likely ossification within the plantar fascia.  IMPRESSION: 1. Findings, as above, suggestive of underlying gout. Clinical correlation is recommended.   Electronically Signed     By: Daniel  Entrikin M.D.   On: 01/09/2015 09:46   Dg Foot Complete Right  01/09/2015   CLINICAL DATA:  Recurring bilateral foot pain  EXAM: RIGHT FOOT COMPLETE - 3+ VIEW  COMPARISON:  None.  FINDINGS: No fracture or dislocation is seen.  Marginal erosion at the medial aspect of the 1st IP joint, involving the proximal phalanx.  The visualized soft tissues are unremarkable.  IMPRESSION: Marginal erosion at the medial aspect of the 1st IP joint. Correlate for inflammatory arthropathy such as gout.  Otherwise, no acute osseus abnormality is seen.   Electronically Signed   By: Sriyesh  Krishnan M.D.   On: 01/09/2015 09:44   Dg Fluoro Guide Ndl Plc/bx  01/14/2015   CLINICAL DATA:  Right shoulder pain and effusion.  Bacteremia.  EXAM: RIGHT SHOULDER ASPIRATION UNDER FLUOROSCOPY  FLUOROSCOPY TIME:  0 min 24 seconds  PROCEDURE: Overlying skin prepped with Betadine, draped in the usual sterile fashion, and infiltrated locally with buffered Lidocaine. Twenty gauge spinal needle was placed in the right glenohumeral joint under direct fluoroscopic visualization.  2 cc of bloody cloudy fluid was aspirated from the joint. Mr. Taborda tolerated the procedure well.   IMPRESSION: Technically successful right shoulder aspiration under fluoroscopy. Fluid sent for laboratory analysis.   Electronically Signed   By: Jim  Maxwell M.D.   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 Other valves appear normal. Normal LV function. Moderate to severe LVH. - most recent TCTS input: favoring a 6 week course of antibiotics before proceding with mitral valve repair/replacement - however, due to persistent leukocytosis and pain in numerous places (presumably related to septic arthritis and myositis), he is probably not currently a candidate for outpatient therapy; mreover, the fact that the organism involved is Staphylococcus aureus he still has the potential to deteriorate clinically without much warning - would need dental extraction per dental consult note prior to valvular surgery - ID also following - on ampicillin - will discuss timing of cath with MD given plateaued creatinine and continually declining anemia - his orders were not released over the weekend so he ate this AM, but will keep NPO from this point forward - risks and benefits of cardiac catheterization have been discussed with the patient- these include bleeding, infection, kidney damage, stroke, heart attack, death.   The patient understands these risks and is willing to proceed.  2. Elevated troponin - likely related to acute sepsis, overall illness - to be evaluated with cath  3. Pericarditis with small fibrinous pericardial effusion by TEE 01/13/15 - Dr. Skains considered evaluation by TCTS for pericardial stripping as he felt this may be challenging to tx with abx alone - follow clinically  4. 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 Acute renal insufficiency - ? CKD - Cr was 1.37 in 11/2013 - this admission: Cr peak 3.15, possibly embolic in etiology  - renal function has plateauted around the 2 range - renal US 1/13: increased echogenicity, cannot exclude medical renal disease; no other acute findings - nephrology following  6. 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 Dickson did not feel there was any utility in a more aggressive workup of his decreased ABI on the left - 01/15/15: LLE venous duplex neg for DVT  7. 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 Hyponatremia 10. Hypoalbuminemia 11. Hypokalemia 12. 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 On IV ATBX per ID. Pt ate this AM but is NPO now. I have gone over the severity of his illness and the need for diagnostic R/L heart cath (limited contrast, radial, cors only). He is being considered for MVR (he also has a small fibrinous pericardial effusion ? Infectious). I have reviewed the procedure with the patient and he agrees to proceed later this PM.  Jonathan J. Berry, M.D., FACP, FACC, FAHA, FSCAI [facility] [address] [place], [place]  [address]  [phone] 01/24/2015 10:40 AM    

## 2015-01-24 NOTE — Interval H&P Note (Signed)
History and Physical Interval Note:  01/24/2015 1:28 PM  Dwayne HickmanBenjamin [name]  has presented today for surgery, with the diagnosis of endocarditis  The various methods of treatment have been discussed with the patient and family. After consideration of risks, benefits and other options for treatment, the patient has consented to  Procedure(s): LEFT AND RIGHT HEART CATHETERIZATION WITH CORONARY ANGIOGRAM (N/A) as a surgical intervention .  The patient's history has been reviewed, patient examined, no change in status, stable for surgery.  I have reviewed the patient's chart and labs.  Questions were answered to the patient's satisfaction.   Cath Lab Visit (complete for each Cath Lab visit)  Clinical Evaluation Leading to the Procedure:   ACS: No.  Non-ACS:    Anginal Classification: No Symptoms  Anti-ischemic medical therapy: No Therapy  Non-Invasive Test Results: No non-invasive testing performed  Prior CABG: No previous CABG       Theron AristaPeter St Lukes [facility] BuxmontJordanMD,FACC 01/24/2015 1:28 PM

## 2015-01-25 ENCOUNTER — Encounter (HOSPITAL_COMMUNITY)
Admission: EM | Disposition: A | Discharge status: Home Health | Payer: Self-pay | Source: Home / Self Care | Attending: Internal Medicine

## 2015-01-25 ENCOUNTER — Inpatient Hospital Stay (HOSPITAL_COMMUNITY): Payer: Medicaid Other | Admitting: Certified Registered Nurse Anesthetist

## 2015-01-25 ENCOUNTER — Encounter (HOSPITAL_COMMUNITY): Payer: Self-pay | Admitting: Certified Registered Nurse Anesthetist

## 2015-01-25 DIAGNOSIS — K036 Deposits [accretions] on teeth: Secondary | ICD-10-CM

## 2015-01-25 DIAGNOSIS — I309 Acute pericarditis, unspecified: Secondary | ICD-10-CM

## 2015-01-25 DIAGNOSIS — K045 Chronic apical periodontitis: Secondary | ICD-10-CM

## 2015-01-25 DIAGNOSIS — K083 Retained dental root: Secondary | ICD-10-CM

## 2015-01-25 HISTORY — PX: MULTIPLE EXTRACTIONS WITH ALVEOLOPLASTY: SHX5342

## 2015-01-25 LAB — CBC
HCT: 23.2 % — ABNORMAL LOW (ref 39.0–52.0)
Hemoglobin: 7.3 g/dL — ABNORMAL LOW (ref 13.0–17.0)
MCH: 27.5 pg (ref 26.0–34.0)
MCHC: 31.5 g/dL (ref 30.0–36.0)
MCV: 87.5 fL (ref 78.0–100.0)
Platelets: 384 10*3/uL (ref 150–400)
RBC: 2.65 MIL/uL — ABNORMAL LOW (ref 4.22–5.81)
RDW: 13.6 % (ref 11.5–15.5)
WBC: 9 10*3/uL (ref 4.0–10.5)

## 2015-01-25 LAB — PREPARE RBC (CROSSMATCH)

## 2015-01-25 LAB — CULTURE, BLOOD (ROUTINE X 2)
Culture: NO GROWTH
Culture: NO GROWTH

## 2015-01-25 LAB — GLUCOSE, CAPILLARY
GLUCOSE-CAPILLARY: 125 mg/dL — AB (ref 70–99)
GLUCOSE-CAPILLARY: 281 mg/dL — AB (ref 70–99)
Glucose-Capillary: 115 mg/dL — ABNORMAL HIGH (ref 70–99)
Glucose-Capillary: 167 mg/dL — ABNORMAL HIGH (ref 70–99)
Glucose-Capillary: 150 mg/dL — ABNORMAL HIGH (ref 70–99)
Glucose-Capillary: 298 mg/dL — ABNORMAL HIGH (ref 70–99)

## 2015-01-25 LAB — BASIC METABOLIC PANEL
Anion gap: 4 — ABNORMAL LOW (ref 5–15)
BUN: 22 mg/dL (ref 6–23)
CO2: 31 mmol/L (ref 19–32)
Calcium: 7.9 mg/dL — ABNORMAL LOW (ref 8.4–10.5)
Chloride: 104 mmol/L (ref 96–112)
Creatinine, Ser: 2.38 mg/dL — ABNORMAL HIGH (ref 0.50–1.35)
GFR calc Af Amer: 39 mL/min — ABNORMAL LOW (ref >90)
GFR calc non Af Amer: 34 mL/min — ABNORMAL LOW (ref >90)
Glucose, Bld: 132 mg/dL — ABNORMAL HIGH (ref 70–99)
Potassium: 4 mmol/L (ref 3.5–5.1)
Sodium: 139 mmol/L (ref 135–145)

## 2015-01-25 LAB — SURGICAL PCR SCREEN
MRSA, PCR: NEGATIVE
Staphylococcus aureus: NEGATIVE

## 2015-01-25 LAB — ABO/RH: ABO/RH(D): B POS

## 2015-01-25 LAB — RETICULOCYTES
RBC.: 2.96 MIL/uL — ABNORMAL LOW (ref 4.22–5.81)
Retic Count, Absolute: 56.2 10*3/uL (ref 19.0–186.0)
Retic Ct Pct: 1.9 % (ref 0.4–3.1)

## 2015-01-25 SURGERY — MULTIPLE EXTRACTION WITH ALVEOLOPLASTY
Anesthesia: General | Site: Mouth

## 2015-01-25 MED ORDER — ONDANSETRON HCL 4 MG/2ML IJ SOLN
INTRAMUSCULAR | Status: DC | PRN
  Administered 2015-01-25: 4 mg via INTRAVENOUS

## 2015-01-25 MED ORDER — GLYCOPYRROLATE 0.2 MG/ML IJ SOLN
INTRAMUSCULAR | Status: DC | PRN
  Administered 2015-01-25: 0.2 mg via INTRAVENOUS

## 2015-01-25 MED ORDER — MORPHINE SULFATE 2 MG/ML IJ SOLN
2.0000 mg | INTRAMUSCULAR | Status: DC | PRN
  Administered 2015-01-25: 2 mg via INTRAVENOUS
  Filled 2015-01-25: qty 1

## 2015-01-25 MED ORDER — FENTANYL CITRATE 0.05 MG/ML IJ SOLN
INTRAMUSCULAR | Status: AC
  Filled 2015-01-25: qty 2

## 2015-01-25 MED ORDER — SODIUM CHLORIDE 0.9 % IV SOLN: INTRAVENOUS | Status: DC

## 2015-01-25 MED ORDER — PROPOFOL 10 MG/ML IV BOLUS
INTRAVENOUS | Status: AC
  Filled 2015-01-25: qty 20

## 2015-01-25 MED ORDER — OXYCODONE HCL 5 MG PO TABS
ORAL_TABLET | ORAL | Status: AC
  Administered 2015-01-25: 10 mg via ORAL
  Filled 2015-01-25: qty 2

## 2015-01-25 MED ORDER — BUPIVACAINE-EPINEPHRINE 0.5% -1:200000 IJ SOLN
INTRAMUSCULAR | Status: DC | PRN
  Administered 2015-01-25 (×2): 1.8 mL

## 2015-01-25 MED ORDER — LIDOCAINE-EPINEPHRINE 2 %-1:100000 IJ SOLN
INTRAMUSCULAR | Status: DC | PRN
  Administered 2015-01-25 (×6): 1.7 mL via INTRADERMAL

## 2015-01-25 MED ORDER — FENTANYL CITRATE 0.05 MG/ML IJ SOLN
INTRAMUSCULAR | Status: DC | PRN
  Administered 2015-01-25 (×4): 50 ug via INTRAVENOUS

## 2015-01-25 MED ORDER — OXYMETAZOLINE HCL 0.05 % NA SOLN
NASAL | Status: AC
  Filled 2015-01-25: qty 15

## 2015-01-25 MED ORDER — NEOSTIGMINE METHYLSULFATE 10 MG/10ML IV SOLN
INTRAVENOUS | Status: DC | PRN
  Administered 2015-01-25: 1 mg via INTRAVENOUS

## 2015-01-25 MED ORDER — 0.9 % SODIUM CHLORIDE (POUR BTL) OPTIME
TOPICAL | Status: DC | PRN
  Administered 2015-01-25: 1000 mL

## 2015-01-25 MED ORDER — SODIUM CHLORIDE 0.9 % IV SOLN
Freq: Once | INTRAVENOUS | Status: AC
  Administered 2015-01-25: 16:00:00 via INTRAVENOUS

## 2015-01-25 MED ORDER — FENTANYL CITRATE 0.05 MG/ML IJ SOLN
INTRAMUSCULAR | Status: AC
  Filled 2015-01-25: qty 5

## 2015-01-25 MED ORDER — FENTANYL CITRATE 0.05 MG/ML IJ SOLN
25.0000 ug | INTRAMUSCULAR | Status: DC | PRN
  Administered 2015-01-25: 50 ug via INTRAVENOUS

## 2015-01-25 MED ORDER — LIDOCAINE-EPINEPHRINE 2 %-1:100000 IJ SOLN
INTRAMUSCULAR | Status: AC
  Filled 2015-01-25: qty 1.7

## 2015-01-25 MED ORDER — SODIUM CHLORIDE 0.9 % IR SOLN: 200.0000 mL | Status: DC

## 2015-01-25 MED ORDER — HEMOSTATIC AGENTS (NO CHARGE) OPTIME
TOPICAL | Status: DC | PRN
  Administered 2015-01-25: 1 via TOPICAL

## 2015-01-25 MED ORDER — ONDANSETRON HCL 4 MG/2ML IJ SOLN
INTRAMUSCULAR | Status: AC
  Filled 2015-01-25: qty 2

## 2015-01-25 MED ORDER — PROMETHAZINE HCL 25 MG/ML IJ SOLN: 6.2500 mg | INTRAMUSCULAR | Status: DC | PRN

## 2015-01-25 MED ORDER — SUCCINYLCHOLINE CHLORIDE 20 MG/ML IJ SOLN
INTRAMUSCULAR | Status: DC | PRN
  Administered 2015-01-25: 120 mg via INTRAVENOUS

## 2015-01-25 MED ORDER — OXYMETAZOLINE HCL 0.05 % NA SOLN
NASAL | Status: DC | PRN
  Administered 2015-01-25: 1 via NASAL

## 2015-01-25 MED ORDER — MEPERIDINE HCL 25 MG/ML IJ SOLN: 6.2500 mg | INTRAMUSCULAR | Status: DC | PRN

## 2015-01-25 MED ORDER — SENNOSIDES-DOCUSATE SODIUM 8.6-50 MG PO TABS
2.0000 | ORAL_TABLET | Freq: Two times a day (BID) | ORAL | Status: DC
Start: 2015-01-25 — End: 2015-01-28
  Administered 2015-01-25 – 2015-01-28 (×5): 2 via ORAL
  Filled 2015-01-25 (×8): qty 2

## 2015-01-25 MED ORDER — VECURONIUM BROMIDE 10 MG IV SOLR
INTRAVENOUS | Status: DC | PRN
  Administered 2015-01-25: 1 mg via INTRAVENOUS

## 2015-01-25 MED ORDER — POLYETHYLENE GLYCOL 3350 17 G PO PACK
17.0000 g | PACK | Freq: Every day | ORAL | Status: DC
  Administered 2015-01-27 – 2015-01-28 (×2): 17 g via ORAL
  Filled 2015-01-25 (×4): qty 1

## 2015-01-25 MED ORDER — BOOST / RESOURCE BREEZE PO LIQD
1.0000 | Freq: Two times a day (BID) | ORAL | Status: DC
  Administered 2015-01-26 – 2015-01-27 (×2): 1 via ORAL

## 2015-01-25 MED ORDER — BUPIVACAINE-EPINEPHRINE (PF) 0.5% -1:200000 IJ SOLN
INTRAMUSCULAR | Status: AC
  Filled 2015-01-25: qty 1.8

## 2015-01-25 MED ORDER — LIDOCAINE HCL (CARDIAC) 20 MG/ML IV SOLN
INTRAVENOUS | Status: DC | PRN
  Administered 2015-01-25: 80 mg via INTRAVENOUS

## 2015-01-25 MED ORDER — MIDAZOLAM HCL 2 MG/2ML IJ SOLN
INTRAMUSCULAR | Status: AC
  Filled 2015-01-25: qty 2

## 2015-01-25 MED ORDER — EPHEDRINE SULFATE 50 MG/ML IJ SOLN
INTRAMUSCULAR | Status: DC | PRN
  Administered 2015-01-25 (×4): 5 mg via INTRAVENOUS

## 2015-01-25 MED ORDER — PRO-STAT SUGAR FREE PO LIQD
30.0000 mL | ORAL | Status: DC
  Administered 2015-01-27 – 2015-01-28 (×2): 30 mL via ORAL
  Filled 2015-01-25 (×4): qty 30

## 2015-01-25 MED ORDER — SODIUM CHLORIDE 0.9 % IV SOLN
INTRAVENOUS | Status: DC
  Administered 2015-01-25 (×2): via INTRAVENOUS

## 2015-01-25 MED ORDER — PROPOFOL 10 MG/ML IV BOLUS
INTRAVENOUS | Status: DC | PRN
  Administered 2015-01-25: 20 mg via INTRAVENOUS
  Administered 2015-01-25: 150 mg via INTRAVENOUS
  Administered 2015-01-25: 30 mg via INTRAVENOUS

## 2015-01-25 SURGICAL SUPPLY — 35 items
ALCOHOL 70% 16 OZ (MISCELLANEOUS) ×3 IMPLANT
ATTRACTOMAT 16X20 MAGNETIC DRP (DRAPES) ×3 IMPLANT
BLADE SURG 15 STRL LF DISP TIS (BLADE) ×2 IMPLANT
BLADE SURG 15 STRL SS (BLADE) ×4
COVER SURGICAL LIGHT HANDLE (MISCELLANEOUS) ×3 IMPLANT
GAUZE PACKING FOLDED 2  STR (GAUZE/BANDAGES/DRESSINGS) ×2
GAUZE PACKING FOLDED 2 STR (GAUZE/BANDAGES/DRESSINGS) ×1 IMPLANT
GAUZE SPONGE 4X4 12PLY STRL (GAUZE/BANDAGES/DRESSINGS) ×3 IMPLANT
GAUZE SPONGE 4X4 16PLY XRAY LF (GAUZE/BANDAGES/DRESSINGS) ×3 IMPLANT
GLOVE BIOGEL PI IND STRL 6 (GLOVE) ×1 IMPLANT
GLOVE BIOGEL PI INDICATOR 6 (GLOVE) ×2
GLOVE SURG ORTHO 8.0 STRL STRW (GLOVE) ×3 IMPLANT
GLOVE SURG SS PI 6.0 STRL IVOR (GLOVE) ×3 IMPLANT
GOWN STRL REUS W/ TWL LRG LVL3 (GOWN DISPOSABLE) ×1 IMPLANT
GOWN STRL REUS W/TWL 2XL LVL3 (GOWN DISPOSABLE) ×3 IMPLANT
GOWN STRL REUS W/TWL LRG LVL3 (GOWN DISPOSABLE) ×2
HEMOSTAT SURGICEL 2X14 (HEMOSTASIS) ×3 IMPLANT
KIT BASIN OR (CUSTOM PROCEDURE TRAY) ×3 IMPLANT
KIT ROOM TURNOVER OR (KITS) ×3 IMPLANT
MANIFOLD NEPTUNE WASTE (CANNULA) ×3 IMPLANT
NEEDLE BLUNT 16X1.5 OR ONLY (NEEDLE) ×3 IMPLANT
NS IRRIG 1000ML POUR BTL (IV SOLUTION) ×3 IMPLANT
PACK EENT II TURBAN DRAPE (CUSTOM PROCEDURE TRAY) ×3 IMPLANT
PAD ARMBOARD 7.5X6 YLW CONV (MISCELLANEOUS) ×3 IMPLANT
SPONGE GAUZE 4X4 12PLY STER LF (GAUZE/BANDAGES/DRESSINGS) ×3 IMPLANT
SPONGE SURGIFOAM ABS GEL 100 (HEMOSTASIS) IMPLANT
SPONGE SURGIFOAM ABS GEL 12-7 (HEMOSTASIS) ×3 IMPLANT
SPONGE SURGIFOAM ABS GEL SZ50 (HEMOSTASIS) IMPLANT
SUCTION FRAZIER TIP 10 FR DISP (SUCTIONS) ×3 IMPLANT
SUT CHROMIC 3 0 PS 2 (SUTURE) ×6 IMPLANT
SYR 50ML SLIP (SYRINGE) ×3 IMPLANT
TOWEL OR 17X26 10 PK STRL BLUE (TOWEL DISPOSABLE) ×3 IMPLANT
TUBE CONNECTING 12'X1/4 (SUCTIONS) ×1
TUBE CONNECTING 12X1/4 (SUCTIONS) ×2 IMPLANT
YANKAUER SUCT BULB TIP NO VENT (SUCTIONS) ×3 IMPLANT

## 2015-01-25 NOTE — Progress Notes (Signed)
NUTRITION FOLLOW UP/CONSULT  INTERVENTION: - Provide Resource Breeze BID while on Clear Liquid diet - Add Glucerna Shake po BID when diet is advanced, each supplement provides 220 kcal and 10 grams of protein. -30 ml Prostat po once daily, each supplement provides 100 kcal and 15 grams of protein.  -Provided and discussed "Recommendations for After Oral Surgery" and " Difficulty Eating for People with Diabetes Nutrition Therapy" handouts from the Academy of Nutrition and Dietetics  NUTRITION DIAGNOSIS: Increased nutrient needs related to wound healing as evidenced by estimated nutrition needs; ongoing  Goal: Pt to meet >/= 90% of their estimated nutrition needs; progressing  Monitor:  PO intake, weight trends, labs, I/O's  35 y.o. male  Admitting Dx: Dehydration  ASSESSMENT: Pt with a history of Uncontrolled HTN, DM2 who presents to the ED with complaints of diarrhea and fevers and chills x 24 hours.Pt with Diabetic Ulcers on both feet.  RD consulted for nutrition assessment due to pt being edentulous. Pt being followed by nutrition team throughout admission. He reports consuming some of Pro-stat supplements but, not all. Per nursing notes, pt has been eating 75-100% of most meals the past few days.   RD provided and discussed "Recommendations for After Oral Surgery" and " Difficulty Eating for People with Diabetes Nutrition Therapy" handouts from the Academy of Nutrition and Dietetics. Discussed appropriate textures of foods and tips for modifying foods from each food group to the appropriate consistency. Pt did not open his mouth to talk during RD visit due to recent dental extraction. RD encouraged pt to request return visit from nutrition team via RN if he has any additional questions or concerns.   Height: Ht Readings from Last 1 Encounters:  01/25/15  (1.88 m)    Weight: Wt Readings from Last 1 Encounters:  01/25/15 242 lb 14.4 oz (110.179 kg)   01/09/15 226 lb 6.4  oz (102.694 kg)    BMI:  Body mass index is 31.17 kg/(m^2). Obesity, class I  Re-Estimated Nutritional Needs: Kcal: 2200-2400 Protein: 110-125 grams Fluid: 2.2 - 2.4 L/day  Skin: Diabetic pressure ulcers bilaterally  Diet Order: Diet clear liquid   Intake/Output Summary (Last 24 hours) at 01/25/15 1622 Last data filed at 01/25/15 1200  Gross per 24 hour  Intake   1920 ml  Output   2075 ml  Net   -155 ml    Last BM: 1/25  Labs:   Recent Labs Lab 01/23/15 0307 01/24/15 0450 01/25/15 0505  NA 134* 136 139  K 4.1 4.0 4.0  CL 95* 100 104  CO2 BUN 26* 22 22  CREATININE 2.27* 2.10* 2.38*  CALCIUM 8.3* 8.1* 7.9*  GLUCOSE 164* 198* 132*    CBG (last 3)   Recent Labs  01/25/15 0824 01/25/15 1157 01/25/15 1321  GLUCAP 115* 167* 150*    Scheduled Meds: . ampicillin (OMNIPEN) IV  2 g Intravenous 6 times per day  . chlorhexidine  15 mL Mouth/Throat BID  . feeding supplement (PRO-STAT SUGAR FREE 64)  30 mL Oral TID WC  . fentaNYL      . hydrALAZINE  25 mg Oral 3 times per day  . Influenza vac split quadrivalent PF  0.5 mL Intramuscular Tomorrow-1000  . insulin aspart  0-15 Units Subcutaneous QAC lunch  . insulin aspart  0-5 Units Subcutaneous QHS  . insulin aspart protamine- aspart  14 Units Subcutaneous BID WC  . living well with diabetes book   Does not apply Once  .  metoprolol tartrate  50 mg Oral BID  . neomycin-bacitracin-polymyxin   Topical Daily  . nicotine  7 mg Transdermal Daily  . polyethylene glycol  17 g Oral Daily  . senna-docusate  2 tablet Oral BID    Continuous Infusions: . sodium chloride 10 mL/hr at 01/25/15 0924  . sodium chloride    . [START ON 01/26/2015] sodium chloride irrigation     Ian Malkineanne Barnett RD, LDN Inpatient Clinical Dietitian Pager: [phone] After Hours Pager: [phone]

## 2015-01-25 NOTE — Op Note (Signed)
OPERATIVE REPORT  Patient:            Dwayne [name] Date of Birth:  05/14/1980 MRN:                [id number]   DATE OF PROCEDURE:  01/25/2015  PREOPERATIVE DIAGNOSES: 1.  Bacterial endocarditis and mitral valve vegetation 2.  Pre-mitral valve replacement dental protocol 3.  Chronic apical periodontitis 4.  Multiple retained root segments 5.  Chronic periodontitis 6.  Accretions 7. Tooth mobility 8. Multiple dental caries   POSTOPERATIVE DIAGNOSES: 1.  Bacterial endocarditis and mitral valve vegetation 2.  Pre-mitral valve replacement dental protocol 3. Chronic apical periodontitis 4. Multiple retained root segments 5. Chronic periodontitis 6. Accretions 7. Tooth mobility 8. Multiple dental caries   OPERATIONS: 1. Multiple extraction of tooth numbers 1, 2, 3, 4, 5, 6, 8, 9, 11, 12, 13, 14, 15, 16, 17, 18, 19, 28, 31, and 32 2. 4 Quadrants of alveoloplasty 3. Gross debridement of remaining dentition   SURGEON: Charlynne Pander, DDS  ASSISTANT: Rory Percy, (dental assistant)  ANESTHESIA: General anesthesia via nasoendotracheal tube.  MEDICATIONS: 1. Ampicillin IV antibiotic therapy per previous orders prior to invasive dental procedures. 2. Local anesthesia with a total utilization of 6 carpules each containing 34 mg of lidocaine with 0.017 mg of epinephrine as well as 2 carpules each containing 9 mg of bupivacaine with 0.009 mg of epinephrine.  SPECIMENS: There were 20 teeth that were discarded.  DRAINS: None  CULTURES: None  COMPLICATIONS: None   ESTIMATED BLOOD LOSS: 100 mLs.  INTRAVENOUS FLUIDS: 1200 mL of normal saline solution  INDICATIONS: The patient was recently diagnosed with bacterial endocarditis with mitral valve vegetation.  A dental consultation was then requested to evaluate poor dentition as part of a medically necessary pre-heart valve surgery dental protocol.  The patient was examined and treatment planned for multiple extractions  with alveoloplasty and pre-prosthetic surgery as indicated along with gross debridement of the remaining dentition.  This treatment plan was formulated to decrease the risks and complications associated with dental infection from further affecting the patient's systemic health and anticipated mitral valve replacement  OPERATIVE FINDINGS: Patient was examined operating room number 8.  The teeth were identified for extraction. Patient was examined and numbers 1, 2, 3, 4, 5, 6, 8, 9, 11, 12, 13, 14, 15, 16, 17, 18, 19, 28, 31, and 32 and need for extraction. Please note: retained root tips in the area of tooth numbers 1, 2, 17 were noted at the time during the examination that were perviously undiagnosed. Missing teeth in the area of tooth numbers 7 and 10 were also confirmed.  The patient was also noted have bilateral mandibular lingual tori that would NOT need to be reduced at this time. The patient was noted to be affected by bacterial endocarditis, pre-mitral valve replacement , chronic apical periodontitis chronic periodontitis, multiple retained root segments, multiple dental caries, significant accretions, and history of oral neglect.  DESCRIPTION OF PROCEDURE: Patient was brought to the main operating room number 8. Patient was then placed in the supine position on the operating table. General anesthesia was then induced per the anesthesia team. The patient was then prepped and draped in the usual manner for dental medicine procedure. A timeout was performed. The patient was identified and procedures were verified. A throat pack was placed at this time. The oral cavity was then thoroughly examined with the findings noted above. The patient was then ready for dental medicine procedure as  follows:  Local anesthesia was then administered sequentially with a total utilization of 6 carpules each containing 34 mg of lidocaine with 0.017 mg of epinephrine as well as 2 carpules  each containing 9 mg bupivacaine  with 0.009 mg of epinephrine.  The Maxillary left and right quadrants first approached. Anesthesia was then delivered utilizing infiltration with lidocaine with epinephrine. A #15 blade incision was then made from the maxillary right tuberosity and extended to the maxillary left tuberosity.  A  surgical flap was then carefully reflected. Appropriate amounts of buccal and interseptal bone were then removed utilizing a surgical handpiece and bur and copious amounts of sterile water around tooth numbers 3, 4, 6, 11, 12.  The teeth were then subluxated with a series of straight elevators. Tooth numbers 1, 2, 3, 4, 5, 6, 8, 9, 11, 12, 13, 14, 15, 16 were then removed with a 150 forceps and/or ronguers without complications. Alveoloplasty was then performed utilizing a ronguers and bone file. The surgical site was then irrigated with copious amounts of sterile saline. The tissues were approximated and trimmed appropriately. The maxillary right surgical site was then closed from the maxillary right tuberosity and extended to the mesial #8 utilizing 3-0 chromic gut suture in a continuous interrupted suture technique 1. Maxillary left surgical site was then closed from the maxillary left tuberosity and extended the mesial #9 utilizing 3-0 chromic gut suture material in a continuous interrupted suture technique 1.  At this point time, the mandibular quadrants were approached. The patient was given bilateral inferior alveolar nerve blocks and long buccal nerve blocks utilizing the bupivacaine with epinephrine. Further infiltration was then achieved utilizing the lidocaine with epinephrine. A 15 blade incision was then made from the distal of number #17 and extended to the mesial of #20.  A second 15 blade incision was made from the distal of #32 and extended to the distal of #29. A sulcular incision was then made around tooth #28 on the facial and lingual aspects. All surgical flaps were then carefully reflected.  Appropriate amounts of buccal and interseptal bone were then removed utilizing a surgical handpiece and copious amount of sterile water around tooth numbers 18, 19, 28. The lower teeth were then subluxated with a series of straight elevators. Retained root tip in the area of #17, 31, and 32 were then removed with a rongeurs without complications. Tooth #28 was then removed with a 151 forceps without complications. Tooth #19 was then removed with a 23 forceps without complications. Tooth #18 was then sectioned from the buccal to the lingual and the roots were elevated out with a series of elevators. Alveoloplasty was then performed utilizing a rongeurs and bone file. The tissues were approximated and trimmed appropriately. The surgical sites were then irrigated with copious amounts of sterile saline. A piece of Surgifoam was then placed in the extraction socket #28. The mandibular left surgical site was then closed from the distal of  #17 and extended to the distal of #20 utilizing 3-0 chromic gut suture in a continuous interrupted suture technique 1. The mandibular right surgical site was then closed from the distal of #32 and extended to the distal of #29 utilizing 3-0 chromic gut suture in a continuous interrupted suture technique 1. A continuous interrupted suture was then placed from the mesial of #29 and extended to the distal of #27 utilizing 3-0 chromic gut material.  At this point time the remaining mandibular dentition was approached. A sonic scaler was used to remove  significant accretions. A series of hand curettes were then utilized to further refine removal of accretions. A sonic scaler was then again used to further refine removal of accretions as needed. At this point in time, additional incipient dental caries were noted on the facial aspect of tooth numbers 20, 22, and 27.  These caries will need to be cared for in follow-up dental care by the general dentist of his choice once he is medically  stable from the anticipated mitral valve replacement.  At this point time, the entire mouth was irrigated with copious amounts of sterile saline. The patient was examined for complications, seeing none, the dental medicine procedure was deemed to be complete. The throat pack was removed at this time. An oral airway was then placed at the request of the anesthesia team. A series of 4 x 4 gauze were placed in the mouth to aid hemostasis. The patient was then handed over to the anesthesia team for final disposition. After an appropriate amount of time, the patient was extubated and taken to the postanesthsia care unit in condition.  All counts were correct for the dental medicine procedure.  The patient is to be followed by cardiology, medicine, and thoracic surgery teams for follow-up evaluation and treatment as indicated. Due to decreasing hemoglobin, further CBC studies and evaluation for transfusion by the medical team is recommended at this time.   Charlynne Pander, DDS.

## 2015-01-25 NOTE — Progress Notes (Signed)
Patient: Dwayne [name] / Admit Date: 01/09/2015 / Date of Encounter: 01/25/2015, 7:50 AM   Subjective: Still has right shoulder pain. Feels rough. Breathing stable. Lying flat. No CP. Reports that LEE has been present for several months. Hemoglobin continues to fall, but no obvious evidence of bleeding.  Objective: Telemetry: NSR Physical Exam: Blood pressure 140/68, pulse 83, temperature 98.4 F (36.9 C), temperature source Oral, resp. rate 18, height 6\' 2"  (1.88 m), weight 242 lb 14.4 oz (110.179 kg), SpO2 100 %. General: Well developed tired appearing M in no acute distress. Head: Normocephalic, atraumatic, sclera non-icteric, no xanthomas, nares are without discharge. Neck: JVP not elevated. Lungs: Clear bilaterally to auscultation without wheezes, rales, or rhonchi. Breathing is unlabored. Heart: RRR S1 S2 with 2/6 systolic murmur at apex, no rubs, or gallops.  Abdomen: Soft, non-tender, non-distended with normoactive bowel sounds. No rebound/guarding. Extremities: 1+ BLE edema. Right groin site without hematoma, ecchymosis or bruit. Neuro: A+Ox3 but appears tired. Psych: Flat affect.   Intake/Output Summary (Last 24 hours) at 01/25/15 0750 Last data filed at 01/25/15 0616  Gross per 24 hour  Intake    960 ml  Output   3025 ml  Net  -2065 ml    Inpatient Medications:  . ampicillin (OMNIPEN) IV  2 g Intravenous 6 times per day  . chlorhexidine  15 mL Mouth/Throat BID  . feeding supplement (PRO-STAT SUGAR FREE 64)  30 mL Oral TID WC  . hydrALAZINE  25 mg Oral 3 times per day  . Influenza vac split quadrivalent PF  0.5 mL Intramuscular Tomorrow-1000  . insulin aspart  0-15 Units Subcutaneous QAC lunch  . insulin aspart  0-5 Units Subcutaneous QHS  . insulin aspart protamine- aspart  14 Units Subcutaneous BID WC  . living well with diabetes book   Does not apply Once  . metoprolol tartrate  50 mg Oral BID  . neomycin-bacitracin-polymyxin   Topical Daily  . nicotine   7 mg Transdermal Daily   Infusions:    Labs:  Recent Labs  01/24/15 0450 01/25/15 0505  NA 136 139  K 4.0 4.0  CL 100 104  CO2 31 31  GLUCOSE 198* 132*  BUN 22 22  CREATININE 2.10* 2.38*  CALCIUM 8.1* 7.9*   No results for input(s): AST, ALT, ALKPHOS, BILITOT, PROT, ALBUMIN in the last 72 hours.  Recent Labs  01/24/15 0450 01/25/15 0505  WBC 10.1 9.0  HGB 7.7* 7.3*  HCT 25.0* 23.2*  MCV 88.3 87.5  PLT 409* 384    Radiology/Studies:  Dg Orthopantogram  01/14/2015   CLINICAL DATA:  Staphylococcal endocarditis.  Poor dentition.  EXAM: ORTHOPANTOGRAM/PANORAMIC  COMPARISON:  None.  FINDINGS: Extensive dental disease with caries involving multiple teeth, including numbers 7, 8, 9, 10, 11, 12, 13, 14, 15, 16, 19, 20, 29, 30, 31 and 32. Periapical lucencies are present adjacent to teeth numbers 29 and 30.  IMPRESSION: Extensive dental disease as described.   Electronically Signed   By: Hulan Saas M.D.   On: 01/14/2015 21:31   Dg Chest 1 View  01/22/2015   CLINICAL DATA:  CHF, increasing right-sided chest pain  EXAM: CHEST - 1 VIEW  COMPARISON:  01/14/2015  FINDINGS: Cardiac shadow is mildly enlarged. Bibasilar atelectatic changes and effusions are seen slightly increased from the prior exam. No other focal abnormality is noted.  IMPRESSION: Slight increase in bibasilar changes.   Electronically Signed   By: Alcide Clever M.D.   On: 01/22/2015 09:57  Dg Chest 2 View  01/14/2015   CLINICAL DATA:  Right shoulder pain for 6 days.  EXAM: CHEST  2 VIEW  COMPARISON:  01/10/2015  FINDINGS: Two views of the chest demonstrate low lung volumes with bibasilar chest densities, left side greater than right. In addition, the heart appears to be enlarged. The configuration of the heart raises the possibility for pericardial fluid. No evidence for pulmonary edema. Difficult to exclude small effusions, particularly on the left side.  IMPRESSION: Low lung volumes with bibasilar densities, left  side greater than right. Basilar densities could represent a combination of atelectasis and pleural fluid.  Heart appears enlarged which may be accentuated by low lung volumes. Pericardial fluid cannot be excluded.   Electronically Signed   By: Richarda Overlie M.D.   On: 01/14/2015 08:17   Dg Shoulder Right  01/09/2015   CLINICAL DATA:  Anterior right shoulder pain  EXAM: RIGHT SHOULDER - 2+ VIEW  COMPARISON:  None.  FINDINGS: No fracture or dislocation is seen.  The joint spaces are preserved.  The visualized soft tissues are unremarkable.  Visualized right lung is clear.  IMPRESSION: No fracture or dislocation is seen.   Electronically Signed   By: Charline Bills M.D.   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  EXAM: MRI HEAD WITHOUT CONTRAST  TECHNIQUE: Multiplanar, multiecho pulse sequences of the brain and surrounding structures were obtained without intravenous contrast.  COMPARISON:  Prior study from 04/25/2012  FINDINGS: Cerebral volume within normal limits for patient age. No significant chronic small vessel ischemic changes identified. No mass lesion, midline shift, or mass effect. Ventricles are normal in size without evidence of hydrocephalus. No extra-axial fluid collection.  There are multiple subcentimeter ischemic infarcts involving predominantly the supratentorial brain. These involve both frontal lobes, the right parietal lobe, left temporal lobe, ischemic infarct within the right is cerebral hemisphere is located in the anterior right frontal lobe and measures 5 mm (series 3, image 36). The largest infarct in the left cerebral hemisphere is located in the deep white matter of the anterior left frontal lobe and measures 13 mm (series 3, image 30). There are infarcts involving the bilateral basal ganglia, the most prominent of which located in the right thalamus which measures 4 mm. There  is a single infratentorial infarct within the left cerebellar hemisphere measuring 4 mm. These are most likely embolic in nature given the history of endocarditis and multiple vascular distributions. Scattered susceptibility artifact seen associated with a few of these infarcts, likely reflecting small petechial hemorrhages. No frank hemorrhagic conversion There is associated T2/FLAIR signal intensity without significant mass effect.  Craniocervical junction within normal limits. Pituitary gland normal.  No acute abnormality seen about the orbits.  Paranasal sinuses are clear.  No mastoid effusion.  Bone marrow signal intensity within normal limits. Visualized upper cervical spine unremarkable.  Scalp soft tissues within normal limits.  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 These are likely embolic in nature given the history of endocarditis and involvement of multiple vascular distributions. No significant mass effect. There are likely scattered small petechial hemorrhages with a number these infarcts without frank hemorrhage.   Electronically Signed   By: Rise Mu M.D.   On: 01/14/2015 21:24   Mr Thoracic Spine Wo Contrast  01/15/2015   ADDENDUM REPORT: 01/15/2015 14:40  ADDENDUM: Study discussed by  telephone with Dr. Paulette Blanch DAM on 01/15/2015 at 1435 hrs.   Electronically Signed   By: Augusto [name] M.D.   On: 01/15/2015 14:40   01/15/2015   CLINICAL DATA:  35 year old male with sepsis and back pain. Into card itis, purulent pericarditis, multiple small embolic infarcts. Initial encounter.  EXAM: MRI THORACIC AND LUMBAR SPINE WITHOUT CONTRAST  TECHNIQUE: Multiplanar and multiecho pulse sequences of the thoracic and lumbar spine were obtained without intravenous contrast.  COMPARISON:  Chest radiographs 01/14/2015. CT Abdomen and Pelvis 05/18/2013.  FINDINGS: MR THORACIC SPINE FINDINGS  Limited sagittal imaging of the cervical  spine is unremarkable.  Preserved thoracic vertebral height and alignment, preserved thoracic vertebral height and alignment.  Throughout much of the T3 and T4 vertebral bodies there is heterogeneous T2 signal as well as patchy pre STIR signal. The STIR images were repeated, an the same patchy increased signal persisted at T3 and T4.  No definite signal abnormality in the intervening disc, or adjacent disc spaces; there is widespread intervertebral disc T2 hyperintensity, as a normal finding seen in young patients.  No definite paraspinal or epidural inflammation at those levels.  No suspicious marrow signal elsewhere; intrinsic T1 and T2 hyperintensity at T6 is a benign vertebral body hemangioma.  However, there are layering pleural effusions left greater than right. Negative visualized upper abdominal viscera.  Epidural lipomatosis throughout the thoracic spine and effaces CSF from the thecal sac from the T5 to the T9 level. No definite spinal cord signal abnormality.  MR LUMBAR SPINE FINDINGS  In the lumbar spine T1-T2 and stir marrow signal appears diminished. This is also true in the visible pelvis. Possible associated decreased T2 signal in both the liver and spleen.  Otherwise negative visualized abdominal viscera.  There is mildly increased STIR signal in the lower lumbar erector spinae muscles bilaterally, at L4-L5 (series 17, images 9 and 1). Subcutaneous edema incidentally noted.  Normal lumbar vertebral height and alignment and no marrow edema or acute osseous abnormality in the lumbar spine.  No definite signal abnormality in the lower thoracic spinal cord, conus at L1-L2.  Lumbar facet hypertrophy.  No lumbar spinal stenosis.  IMPRESSION: MR THORACIC SPINE IMPRESSION  1. Abnormal marrow edema in the T3 and T4 vertebral bodies highly suspicious for acute osteomyelitis in this setting. No associated epidural or paraspinal inflammation identified. 2. No other acute findings in the thoracic spine. Epidural  lipomatosis. 3. Left greater than right layering pleural effusions.  MR LUMBAR SPINE IMPRESSION  1. Mild increased signal in the L4-L5 level erector spinae muscles bilaterally, which could be infectious myositis but also this appearance can be seen without infection in chronically debilitated patients. 2. No other acute finding in the lumbar spine. 3. Decreased bone marrow signal suggestive of red marrow conversion versus hemosiderosis.  Electronically Signed: By: Augusto [name] M.D. On: 01/15/2015 14:20   Mr Lumbar Spine Wo Contrast  01/15/2015   ADDENDUM REPORT: 01/15/2015 14:40  ADDENDUM: Study discussed by telephone with Dr. Paulette Blanch DAM on 01/15/2015 at 1435 hrs.   Electronically Signed   By: Augusto [name] M.D.   On: 01/15/2015 14:40   01/15/2015   CLINICAL DATA:  35 year old male with sepsis and back pain. Into card itis, purulent pericarditis, multiple small embolic infarcts. Initial encounter.  EXAM: MRI THORACIC AND LUMBAR SPINE WITHOUT CONTRAST  TECHNIQUE: Multiplanar and multiecho pulse sequences of the thoracic and lumbar spine were obtained without intravenous contrast.  COMPARISON:  Chest radiographs 01/14/2015. CT Abdomen and  Pelvis 05/18/2013.  FINDINGS: MR THORACIC SPINE FINDINGS  Limited sagittal imaging of the cervical spine is unremarkable.  Preserved thoracic vertebral height and alignment, preserved thoracic vertebral height and alignment.  Throughout much of the T3 and T4 vertebral bodies there is heterogeneous T2 signal as well as patchy pre STIR signal. The STIR images were repeated, an the same patchy increased signal persisted at T3 and T4.  No definite signal abnormality in the intervening disc, or adjacent disc spaces; there is widespread intervertebral disc T2 hyperintensity, as a normal finding seen in young patients.  No definite paraspinal or epidural inflammation at those levels.  No suspicious marrow signal elsewhere; intrinsic T1 and T2 hyperintensity at T6 is a benign vertebral  body hemangioma.  However, there are layering pleural effusions left greater than right. Negative visualized upper abdominal viscera.  Epidural lipomatosis throughout the thoracic spine and effaces CSF from the thecal sac from the T5 to the T9 level. No definite spinal cord signal abnormality.  MR LUMBAR SPINE FINDINGS  In the lumbar spine T1-T2 and stir marrow signal appears diminished. This is also true in the visible pelvis. Possible associated decreased T2 signal in both the liver and spleen.  Otherwise negative visualized abdominal viscera.  There is mildly increased STIR signal in the lower lumbar erector spinae muscles bilaterally, at L4-L5 (series 17, images 9 and 1). Subcutaneous edema incidentally noted.  Normal lumbar vertebral height and alignment and no marrow edema or acute osseous abnormality in the lumbar spine.  No definite signal abnormality in the lower thoracic spinal cord, conus at L1-L2.  Lumbar facet hypertrophy.  No lumbar spinal stenosis.  IMPRESSION: MR THORACIC SPINE IMPRESSION  1. Abnormal marrow edema in the T3 and T4 vertebral bodies highly suspicious for acute osteomyelitis in this setting. No associated epidural or paraspinal inflammation identified. 2. No other acute findings in the thoracic spine. Epidural lipomatosis. 3. Left greater than right layering pleural effusions.  MR LUMBAR SPINE IMPRESSION  1. Mild increased signal in the L4-L5 level erector spinae muscles bilaterally, which could be infectious myositis but also this appearance can be seen without infection in chronically debilitated patients. 2. No other acute finding in the lumbar spine. 3. Decreased bone marrow signal suggestive of red marrow conversion versus hemosiderosis.  Electronically Signed: By: Augusto [name] M.D. On: 01/15/2015 14:20   US Renal  01/12/2015   CLINICAL DATA:  Acute renal failure  EXAM: RENAL/URINARY TRACT ULTRASOUND COMPLETE  COMPARISON:  None.  FINDINGS: Right Kidney:  Length: 12.8 cm in length.  Diffuse increased cortical echogenicity suspicious for medical renal disease. No hydronephrosis. No renal calculus.  Left Kidney:  Length: . 12.6 cm in length. Diffuse increased echogenicity suspicious for medical renal disease. No hydronephrosis. No diagnostic renal calculus.  Bladder:  Appears normal for degree of bladder distention.  IMPRESSION: Bilateral kidneys shows diffuse increased echogenicity. Medical renal disease cannot be excluded. No hydronephrosis or diagnostic renal calculus.   Electronically Signed   By: Natasha Mead M.D.   On: 01/12/2015 16:08   Mr Shoulder Right Wo Contrast  01/11/2015   CLINICAL DATA:  Right shoulder pain.  EXAM: MRI OF THE RIGHT SHOULDER WITHOUT CONTRAST  TECHNIQUE: Multiplanar, multisequence MR imaging of the shoulder was performed. No intravenous contrast was administered.  COMPARISON:  Radiographs dated 01/09/2015  FINDINGS: Rotator cuff:  Normal.  Muscles:  Normal.  Biceps long head: Properly located and intact. There is inflammation in the soft tissues around the bicipital tendon sheath with fluid  in the bicipital tendon sheath.  Acromioclavicular Joint: Small degenerative cyst in the acromion. Otherwise normal AC joint. Type 1 acromion. No bursitis.  Glenohumeral Joint: There is a moderate glenohumeral joint effusion with extravasation of contrast from the subcoracoid recess of the joint into the adjacent soft tissues. Fluid distends the bicipital tendon sheath.  Labrum:  Intact.  Bones: No significant abnormalities. Tiny degenerative cystic areas in the posterior aspect of the greater tuberosity of the proximal humerus.  IMPRESSION: 1. Moderate glenohumeral joint effusion with some extravasation of joint. Fluid distends the bicipital tendon sheath with inflammation/extravasated joint fluid around the bicipital tendon sheath. The possibly of synovitis should be considered. 2. Normal rotator cuff.   Electronically Signed   By: Geanie Cooley M.D.   On: 01/11/2015 11:21    Mr Tibia Fibula Right Wo Contrast  01/15/2015   CLINICAL DATA:  Diabetic patient with bacteremia and right leg pain.  EXAM: MRI OF LOWER RIGHT EXTREMITY WITHOUT CONTRAST  TECHNIQUE: Multiplanar, multisequence MR imaging of the right lower leg was performed. No intravenous contrast was administered.  COMPARISON:  None.  FINDINGS: The axial and coronal sequences incidentally include the left lower leg. There is marked subcutaneous edema of both lower legs which appears slightly worse on the left. No focal subcutaneous fluid collection is identified. Mildly increased T2 signal is seen in the gastrocnemius, soleus and and tibialis posterior muscles bilaterally which appears symmetric. No intramuscular fluid collection is identified. No bone marrow signal abnormality to suggest osteomyelitis is seen. There is no muscle or tendon tear.  IMPRESSION: Intense bilateral lower leg subcutaneous edema appears worse on the left and could be due to dependent change or cellulitis.  Mildly increased T2 signal in the gastrocnemius, soleus and tibialis posterior muscles bilaterally could be due to myositis or early changes of denervation atrophy in this diabetic patient.  Negative for abscess or osteomyelitis.   Electronically Signed   By: Drusilla Kanner M.D.   On: 01/15/2015 08:28   Mr Foot Right Wo Contrast  01/11/2015   CLINICAL DATA:  Diabetic foot ulcers.  EXAM: MRI OF THE RIGHT FOREFOOT WITHOUT CONTRAST  TECHNIQUE: Multiplanar, multisequence MR imaging was performed. No intravenous contrast was administered.  COMPARISON:  Radiographs dated 01/09/2011  FINDINGS: There are small erosions of the proximal phalanx at the IP joint of the great toe and there are small erosions at the first metatarsal phalangeal joint, both consistent with gout. There are no soft tissue abscesses. There is no osteomyelitis. There is nonspecific edema in the subcutaneous fat of the dorsum of the foot. Muscles and tendons and ligaments appear  appear normal. No joint effusions.  IMPRESSION: Subcutaneous edema primarily on the dorsum of the foot. 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   Electronically Signed   By: Geanie Cooley M.D.   On: 01/11/2015 12:50   Mr Femur Left Wo Contrast  01/12/2015   CLINICAL DATA:  Diabetes. Peripheral neuropathy. Sepsis. Bilateral thigh pain. RIGHT thigh pain. Tenderness in the medial aspect of the thigh.  EXAM: MR OF THE LEFT FEMUR WITHOUT CONTRAST  TECHNIQUE: Multiplanar, multisequence MR imaging was performed. No intravenous contrast was administered.  COMPARISON:  None.  FINDINGS: Diffuse subcutaneous edema is present around the pelvis and in both thighs, most compatible with anasarca. This could also represent cellulitis in the appropriate clinical setting. Subcutaneous fluid is present in the lateral LEFT thigh, without a discrete abscess. There is myositis of the LEFT vastus lateralis with mild muscular edema av  but no significant swelling. No fatty atrophy.  Bone marrow signal shows heterogenous marrow. This is a nonspecific finding most commonly associated with obesity, anemia, cigarette smoking or chronic disease. Negative for hip fracture or AVN. There is no evidence of osteomyelitis. No deep soft tissue abscesses are present.  Bilaterally in the groin, there are prominent lymph nodes which are probably reactive to lower extremity process.  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  IMPRESSION: 1. Diffuse subcutaneous edema in the pelvis and thighs bilaterally, suggesting anasarca. 2. More prominent subcutaneous edema along the LEFT thigh with subcutaneous fluid superficial to the muscular fascia, likely representing cellulitis. No abscess. 3. Myositis of the proximal vastus lateralis, probably infectious. Reactive edema could produce this appearance as well. 4. Reactive inguinal adenopathy bilaterally. 5. Negative for osteomyelitis.    Electronically Signed   By: Andreas Newport M.D.   On: 01/12/2015 14:33   Mr Foot Left Wo Contrast  01/11/2015   CLINICAL DATA:  Diabetic foot ulcers.  Foot pain.  Bony erosions.  EXAM: MRI OF THE LEFT FOREFOOT WITHOUT CONTRAST  TECHNIQUE: Multiplanar, multisequence MR imaging was performed. No intravenous contrast was administered.  COMPARISON:  Radiograph dated 01/09/2015  FINDINGS: There is slight edema throughout the medial cuneiform. There is artifactual increased signal from the metatarsal heads in from the proximal phalanx of the great toe. There are erosions at IP joint and first metatarsal phalangeal joint consistent with gout.  There are no soft tissue abscesses and there is no osteomyelitis. There is prominent edema in the subcutaneous fat of the dorsum of the foot.  IMPRESSION: No evidence of osteomyelitis or abscess. The edema in the medial cuneiform is most likely a stress reaction. 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   Electronically Signed   By: Geanie Cooley M.D.   On: 01/11/2015 12:15   Dg Chest Port 1 View  01/10/2015   CLINICAL DATA:  Fever and sweats.  EXAM: PORTABLE CHEST - 1 VIEW  COMPARISON:  None.  FINDINGS: Low lung volumes with vascular crowding and streaky subsegmental atelectasis. No obvious infiltrate or effusion. The heart is within normal limits in size given the AP portable technique. The bony structures are grossly normal.  IMPRESSION: Low lung volumes with vascular crowding and bibasilar atelectasis.   Electronically Signed   By: Loralie Champagne M.D.   On: 01/10/2015 11:27   Dg Foot Complete Left  01/09/2015   CLINICAL DATA:  35 year old male with recurrent bilateral foot pain.  EXAM: LEFT FOOT - COMPLETE 3+ VIEW  COMPARISON:  01/05/2014.  FINDINGS: Large periarticular erosions are noted adjacent to the first MTP joint, and at the first interphalangeal joint along the medial margin in both locations. These have erosions have sclerotic  margins and overhanging edges, and there is adjacent soft tissue prominence, suggestive of gouty erosions. No adjacent soft tissue calcification is noted. No acute displaced fracture, subluxation or dislocation. Small heterotopic ossification adjacent to the plantar aspect of the calcaneus, likely ossification within the plantar fascia.  IMPRESSION: 1. Findings, as above, suggestive of underlying gout. Clinical correlation is recommended.   Electronically Signed   By: Trudie Reed M.D.   On: 01/09/2015 09:46   Dg Foot Complete Right  01/09/2015   CLINICAL DATA:  Recurring bilateral foot pain  EXAM: RIGHT FOOT COMPLETE - 3+ VIEW  COMPARISON:  None.  FINDINGS: No fracture or dislocation is seen.  Marginal erosion at the medial aspect of the 1st IP joint, involving the proximal phalanx.  The visualized soft tissues are unremarkable.  IMPRESSION: Marginal erosion at the medial aspect of the 1st IP joint. Correlate for inflammatory arthropathy such as gout.  Otherwise, no acute osseus abnormality is seen.   Electronically Signed   By: Charline Bills M.D.   On: 01/09/2015 09:44   Dg Fluoro Guide Ndl Plc/bx  01/14/2015   CLINICAL DATA:  Right shoulder pain and effusion.  Bacteremia.  EXAM: RIGHT SHOULDER ASPIRATION UNDER FLUOROSCOPY  FLUOROSCOPY TIME:  0 min 24 seconds  PROCEDURE: Overlying skin prepped with Betadine, draped in the usual sterile fashion, and infiltrated locally with buffered Lidocaine. Twenty gauge spinal needle was placed in the right glenohumeral joint under direct fluoroscopic visualization.  2 cc of bloody cloudy fluid was aspirated from the joint. Mr. Olander tolerated the procedure well.  IMPRESSION: Technically successful right shoulder aspiration under fluoroscopy. Fluid sent for laboratory analysis.   Electronically Signed   By: Geanie Cooley M.D.   On: 01/14/2015 09:30     Assessment and Plan  35 y/o M with DM c/b peripheral neuropathy and foot ulcer history (debridement  12/2013), tobacco abuse admitted with fevers, diarrhea, fatigue and found to have septic shock, MSSA bacetremia, elevated troponin, acute renal failure, dehydration, abnormal ABIs, poor dental status, osteomyelitis, septic emboli to the brain, and pericardial effusion.   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 Other valves appear normal. Normal LV function. Moderate to severe LVH. - most recent TCTS input: favoring a 6 week course of antibiotics before proceding with mitral valve repair/replacement - however, due to persistent leukocytosis and pain in numerous places (presumably related to septic arthritis and myositis), he is probably not currently a candidate for outpatient therapy; mreover, the fact that the organism involved is Staphylococcus aureus he still has the potential to deteriorate clinically without much warning - LHC 01/24/15: normal cors, mod pulm HTN, elevated LV filling pressures with large V wave c/w severe MR - ID also following - on ampicillin - for dental extraction today  2. Elevated troponin c/w demand ischemia - cath with normal cors  3. Pericarditis with small fibrinous pericardial effusion by TEE 01/13/15 - Dr. Anne Fu recommended consideration by TCTS for pericardial stripping as he felt this may be challenging to tx with abx alone - follow clinically  4. 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 Acute renal insufficiency - ? CKD - Cr was 1.37 in 11/2013  - this admission: Cr peak 3.15, possibly embolic in etiology  - renal function has plateauted around the 2 range - slight increase to 2.38 s/p cath - will continue to follow - renal US 1/13: increased echogenicity, cannot exclude medical renal disease; no other acute findings - nephrology following  6. Worsening anemia with  thrombocytosis, per IM  - Hgb 11.7 on admission with gradual worsening to 7.3 (decline happening before cath) - ?IM feels at baseline but does not appear that way - recommend further eval per IM  7. 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 Edilia Boickson did not feel there was any utility in a more aggressive workup of his decreased ABI on the left - 01/15/15: LLE venous duplex neg for DVT  8. 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 Hyponatremia 11. Hypoalbuminemia 12. Hypokalemia  Signed, Dayna Dunn PA-C  Agree with findings by Ronie Spiesayna Dunn PA-C  S/P R/L heart cath yesterday. Nl cors, elevated filling pressures and high V-wave c/w MR. Had dental extraction this AM. ATBX per IM and ID.   Runell GessJonathan J. Alejah Aristizabal, M.D., FACP, [facility] HospitalFACC, Earl LagosFAHA, [facility] LLCFSCAI [facility] [address] [place], [place]  [address]  [phone] 01/25/2015 2:02 PM

## 2015-01-25 NOTE — Anesthesia Postprocedure Evaluation (Signed)
  Anesthesia Post-op Note  Patient: Rob HickmanBenjamin Knowles  Procedure(s) Performed: Procedure(s): Extraction of tooth #'s 1,2,3,4,5,6,8,9,11,12,13,14,15,16,17,18,19 [id number] with alveoloplasty and gross debridement of remaining teeth. (N/A)  Patient Location: PACU  Anesthesia Type:General  Level of Consciousness: awake and alert   Airway and Oxygen Therapy: Patient Spontanous Breathing and Patient connected to nasal cannula oxygen  Post-op Pain: mild  Post-op Assessment: Post-op Vital signs reviewed and Patient's Cardiovascular Status Stable  Post-op Vital Signs: Reviewed and stable  Last Vitals:  Filed Vitals:   01/25/15 1237  BP: 178/82  Pulse: 81  Temp: 36.7 C  Resp: 16    Complications: No apparent anesthesia complications

## 2015-01-25 NOTE — Anesthesia Procedure Notes (Addendum)
Procedure Name: Intubation Date/Time: 01/25/2015 9:48 AM Performed by: Lovie CholOCK, Vidya Bamford K Pre-anesthesia Checklist: Patient identified, Emergency Drugs available, Suction available, Patient being monitored and Timeout performed Patient Re-evaluated:Patient Re-evaluated prior to inductionOxygen Delivery Method: Circle system utilized Preoxygenation: Pre-oxygenation with 100% oxygen Intubation Type: IV induction Ventilation: Mask ventilation without difficulty Grade View: Grade I Nasal Tubes: Right, Nasal Rae and Nasal prep performed Tube size: 7.0 mm Number of attempts: 1 Airway Equipment and Method: Video-laryngoscopy Placement Confirmation: positive ETCO2,  CO2 detector and breath sounds checked- equal and bilateral Secured at: 28 cm Tube secured with: Tape Dental Injury: Teeth and Oropharynx as per pre-operative assessment

## 2015-01-25 NOTE — Progress Notes (Signed)
[facility] for Infectious Disease  Day #17 abtx, Day #7 ampicillin  Subjective: Had 20 teeth extracted this morning, still having significant dental pain as one would imagine PPD#1 from [facility]  Antibiotics:  Anti-infectives    Start     Dose/Rate Route Frequency Ordered Stop   01/19/15 2000  ampicillin (OMNIPEN) 2 g in sodium chloride 0.9 % 50 mL IVPB     2 g150 mL/hr over 20 Minutes Intravenous 6 times per day 01/19/15 1121     01/18/15 2000  penicillin G potassium 12 Million Units in dextrose 5 % 500 mL continuous infusion     12 Million Units41.7 mL/hr over 12 Hours Intravenous Every 12 hours 01/18/15 1432 01/19/15 2050   01/15/15 1600  nafcillin 2 g in dextrose 5 % 50 mL IVPB  Status:  Discontinued     2 g100 mL/hr over 30 Minutes Intravenous 6 times per day 01/15/15 1503 01/15/15 1517   01/15/15 1600  nafcillin 2 g in dextrose 5 % 50 mL IVPB     2 g100 mL/hr over 30 Minutes Intravenous 6 times per day 01/15/15 1517 01/18/15 1753   01/15/15 0930  nafcillin injection 2 g  Status:  Discontinued     2 g Intravenous Every 4 hours 01/15/15 0921 01/15/15 1503   01/14/15 1500  DAPTOmycin (CUBICIN) 600 mg in sodium chloride 0.9 % IVPB  Status:  Discontinued     600 mg224 mL/hr over 30 Minutes Intravenous Every 48 hours 01/12/15 1421 01/13/15 1239   01/13/15 1500  ceFAZolin (ANCEF) IVPB 2 g/50 mL premix  Status:  Discontinued     2 g100 mL/hr over 30 Minutes Intravenous 3 times per day 01/13/15 1407 01/15/15 0921   01/12/15 1600  ceFAZolin (ANCEF) IVPB 2 g/50 mL premix  Status:  Discontinued     2 g100 mL/hr over 30 Minutes Intravenous Every 12 hours 01/12/15 1419 01/13/15 1407   01/11/15 1200  DAPTOmycin (CUBICIN) 600 mg in sodium chloride 0.9 % IVPB  Status:  Discontinued     600 mg224 mL/hr over 30 Minutes Intravenous Every 24 hours 01/11/15 1003 01/12/15 1421   01/10/15 1200  vancomycin (VANCOCIN) IVPB 1000 mg/200 mL premix  Status:  Discontinued     1,000 mg200 mL/hr over 60  Minutes Intravenous Every 8 hours 01/10/15 1044 01/11/15 0947   01/10/15 1100  piperacillin-tazobactam (ZOSYN) IVPB 3.375 g  Status:  Discontinued     3.375 g12.5 mL/hr over 240 Minutes Intravenous Every 8 hours 01/10/15 1043 01/11/15 0902   01/09/15 0700  metroNIDAZOLE (FLAGYL) IVPB 500 mg  Status:  Discontinued     500 mg100 mL/hr over 60 Minutes Intravenous Every 8 hours 01/09/15 0651 01/10/15 1018      Medications: Scheduled Meds: . sodium chloride   Intravenous Once  . ampicillin (OMNIPEN) IV  2 g Intravenous 6 times per day  . chlorhexidine  15 mL Mouth/Throat BID  . feeding supplement (PRO-STAT SUGAR FREE 64)  30 mL Oral TID WC  . fentaNYL      . hydrALAZINE  25 mg Oral 3 times per day  . Influenza vac split quadrivalent PF  0.5 mL Intramuscular Tomorrow-1000  . insulin aspart  0-15 Units Subcutaneous QAC lunch  . insulin aspart  0-5 Units Subcutaneous QHS  . insulin aspart protamine- aspart  14 Units Subcutaneous BID WC  . living well with diabetes book   Does not apply Once  . metoprolol tartrate  50 mg Oral BID  .  neomycin-bacitracin-polymyxin   Topical Daily  . nicotine  7 mg Transdermal Daily  . polyethylene glycol  17 g Oral Daily  . senna-docusate  2 tablet Oral BID    Objective: Weight change: -12.8 oz (-0.363 kg)  Intake/Output Summary (Last 24 hours) at 01/25/15 1524 Last data filed at 01/25/15 1200  Gross per 24 hour  Intake   1920 ml  Output   2675 ml  Net   -755 ml   Blood pressure 171/90, pulse 86, temperature 98.3 F (36.8 C), temperature source Axillary, resp. rate 17, height 6' 2"  (1.88 m), weight 242 lb 14.4 oz (110.179 kg), SpO2 93 %. Temp:  [97.5 F (36.4 C)-98.4 F (36.9 C)] 98.3 F (36.8 C) (01/26 1328) Pulse Rate:  [79-93] 86 (01/26 1328) Resp:  [15-20] 17 (01/26 1328) BP: (138-178)/(68-90) 171/90 mmHg (01/26 1328) SpO2:  [93 %-100 %] 93 % (01/26 1328) Weight:  [242 lb 14.4 oz (110.179 kg)] 242 lb 14.4 oz (110.179 kg) (01/26  0018)  Physical Exam: BP 171/90 mmHg  Pulse 86  Temp(Src) 98.3 F (36.8 C) (Axillary)  Resp 17  Ht 6' 2"  (1.88 m)  Wt 242 lb 14.4 oz (110.179 kg)  BMI 31.17 kg/m2  SpO2 93%  Physical Exam  Constitutional: He is oriented to person, sedated from pain medication sitting on edge of bed. No distress.  HENT:  Mouth/Throat: swollen gum, blood tinged gauze Cardiovascular: Normal rate, regular rhythm and normal heart sounds. Nl s1, s2, 1/2 HSM at apex Pulmonary/Chest: Effort normal and breath sounds normal. No respiratory distress. He has no wheezes.  Abdominal: Soft. Bowel sounds are normal. He exhibits no distension. There is no tenderness.  Lymphadenopathy:  He has no cervical adenopathy.  Skin: Skin is warm and dry. No rash noted. No erythema.  Ext: right shoulder decrease range of motion than left shoulder.    CBC:   CBC Latest Ref Rng 01/25/2015 01/24/2015 01/22/2015  WBC 4.0 - 10.5 K/uL 9.0 10.1 10.9(H)  Hemoglobin 13.0 - 17.0 g/dL 7.3(L) 7.7(L) 8.2(L)  Hematocrit 39.0 - 52.0 % 23.2(L) 25.0(L) 25.9(L)  Platelets 150 - 400 K/uL 384 409(H) 421(H)     BMET  Recent Labs  01/24/15 0450 01/25/15 0505  NA 136 139  K 4.0 4.0  CL 100 104  CO2 31 31  GLUCOSE 198* 132*  BUN 22 22  CREATININE 2.10* 2.38*  CALCIUM 8.1* 7.9*     Liver Panel  No results for input(s): PROT, ALBUMIN, AST, ALT, ALKPHOS, BILITOT, BILIDIR, IBILI in the last 72 hours.  Micro Results: Recent Results (from the past 720 hour(s))  Urine culture     Status: None   Collection Time: 01/09/15  1:31 AM  Result Value Ref Range Status   Specimen Description URINE, CLEAN CATCH  Final   Special Requests NONE  Final   Colony Count   Final    3,000 COLONIES/ML Performed at [facility]    Culture   Final    INSIGNIFICANT GROWTH Performed at [facility]    Report Status 01/10/2015 FINAL  Final  Stool culture     Status: None   Collection Time: 01/09/15  4:09 AM  Result Value Ref  Range Status   Specimen Description VAGINAL/RECTAL  Final   Special Requests Normal  Final   Culture   Final    NO SALMONELLA, SHIGELLA, CAMPYLOBACTER, YERSINIA, OR E.COLI 0157:H7 ISOLATED Performed at [facility]    Report Status 01/12/2015 FINAL  Final  Clostridium Difficile  by PCR     Status: None   Collection Time: 01/09/15  4:09 AM  Result Value Ref Range Status   C difficile by pcr NEGATIVE NEGATIVE Final  Culture, blood (routine x 2)     Status: None   Collection Time: 01/10/15  4:35 AM  Result Value Ref Range Status   Specimen Description BLOOD RIGHT HAND  Final   Special Requests BOTTLES DRAWN AEROBIC AND ANAEROBIC 10CC EA  Final   Culture   Final    STAPHYLOCOCCUS AUREUS Note: SUSCEPTIBILITIES PERFORMED ON PREVIOUS CULTURE WITHIN THE LAST 5 DAYS. Note: Culture results may be compromised due to an excessive volume of blood received in culture bottles. Gram Stain Report Called to,Read Back By and Verified With:  MONICA YIM ON 01/10/2015 AT 11:36P BY WILEJ Performed at [facility]    Report Status 01/13/2015 FINAL  Final  Culture, blood (routine x 2)     Status: None   Collection Time: 01/10/15  4:46 AM  Result Value Ref Range Status   Specimen Description BLOOD LEFT ARM  Final   Special Requests BOTTLES DRAWN AEROBIC AND ANAEROBIC 10CC EA  Final   Culture   Final    STAPHYLOCOCCUS AUREUS Note: SUSCEPTIBILITIES PERFORMED ON PREVIOUS CULTURE WITHIN THE LAST 5 DAYS. Note: Culture results may be compromised due to an excessive volume of blood received in culture bottles. Gram Stain Report Called to,Read Back By and Verified With: MONICA YIM ON 01/10/2015 AT 11:36P BY WILEJ Performed at [facility]    Report Status 01/13/2015 FINAL  Final  Respiratory virus panel (routine influenza)     Status: None   Collection Time: 01/10/15 10:34 AM  Result Value Ref Range Status   Source - RVPAN NASAL SWAB  Corrected    Comment: CORRECTED ON 01/12 AT 2036:  PREVIOUSLY REPORTED AS NASAL SWAB   Respiratory Syncytial Virus A NOT DETECTED  Final   Respiratory Syncytial Virus B NOT DETECTED  Final   Influenza A NOT DETECTED  Final   Influenza B NOT DETECTED  Final   Parainfluenza 1 NOT DETECTED  Final   Parainfluenza 2 NOT DETECTED  Final   Parainfluenza 3 NOT DETECTED  Final   Metapneumovirus NOT DETECTED  Final   Rhinovirus NOT DETECTED  Final   Adenovirus NOT DETECTED  Final   Influenza A H1 NOT DETECTED  Final   Influenza A H3 NOT DETECTED  Final    Comment: (NOTE)       Normal Reference Range for each Analyte: NOT DETECTED Testing performed using the Luminex xTAG Respiratory Viral Panel test kit. The analytical performance characteristics of this assay have been determined by [facility].  The modifications have not been cleared or approved by the FDA. This assay has been validated pursuant to the CLIA regulations and is used for clinical purposes. Performed at [facility], blood (routine x 2)     Status: None   Collection Time: 01/10/15 11:19 AM  Result Value Ref Range Status   Specimen Description BLOOD LEFT FOREARM  Final   Special Requests BOTTLES DRAWN AEROBIC ONLY 5CC  Final   Culture   Final    STAPHYLOCOCCUS AUREUS Note: RIFAMPIN AND GENTAMICIN SHOULD NOT BE USED AS SINGLE DRUGS FOR TREATMENT OF STAPH INFECTIONS. Note: Gram Stain Report Called to,Read Back By and Verified With: NURSE MONICA YIM 01/11/15 6:05AM THOMI Performed at [facility]    Report Status 01/13/2015 FINAL  Final  Organism ID, Bacteria STAPHYLOCOCCUS AUREUS  Final      Susceptibility   Staphylococcus aureus - MIC*    CLINDAMYCIN <=0.25 SENSITIVE Sensitive     ERYTHROMYCIN <=0.25 SENSITIVE Sensitive     GENTAMICIN <=0.5 SENSITIVE Sensitive     LEVOFLOXACIN 4 INTERMEDIATE Intermediate     OXACILLIN 0.5 SENSITIVE Sensitive     PENICILLIN 0.12 SENSITIVE Sensitive     RIFAMPIN 2 INTERMEDIATE Intermediate      TRIMETH/SULFA <=10 SENSITIVE Sensitive     VANCOMYCIN 1 SENSITIVE Sensitive     TETRACYCLINE <=1 SENSITIVE Sensitive     MOXIFLOXACIN 2 RESISTANT Resistant     * STAPHYLOCOCCUS AUREUS  Culture, blood (routine x 2)     Status: None   Collection Time: 01/10/15 11:27 AM  Result Value Ref Range Status   Specimen Description BLOOD LEFT ANTECUBITAL  Final   Special Requests BOTTLES DRAWN AEROBIC ONLY Thomson  Final   Culture   Final    STAPHYLOCOCCUS AUREUS Note: SUSCEPTIBILITIES PERFORMED ON PREVIOUS CULTURE WITHIN THE LAST 5 DAYS. Note: Gram Stain Report Called to,Read Back By and Verified With: NURSE MONICA YIM 01/11/15 6:05AM THOMI Performed at [facility]    Report Status 01/13/2015 FINAL  Final  Culture, blood (routine x 2)     Status: None   Collection Time: 01/13/15  7:20 PM  Result Value Ref Range Status   Specimen Description BLOOD RIGHT ARM  Final   Special Requests BOTTLES DRAWN AEROBIC AND ANAEROBIC 5CC  Final   Culture   Final    NO GROWTH 5 DAYS Performed at [facility]    Report Status 01/20/2015 FINAL  Final  Culture, blood (routine x 2)     Status: None   Collection Time: 01/13/15  7:20 PM  Result Value Ref Range Status   Specimen Description BLOOD RIGHT ARM  Final   Special Requests BOTTLES DRAWN AEROBIC AND ANAEROBIC 3CC  Final   Culture   Final    NO GROWTH 5 DAYS Performed at [facility]    Report Status 01/20/2015 FINAL  Final  Body fluid culture     Status: None   Collection Time: 01/14/15  9:02 AM  Result Value Ref Range Status   Specimen Description FLUID RIGHT SHOULDER SYNOVIAL  Final   Special Requests NONE  Final   Gram Stain   Final    FEW WBC PRESENT,BOTH PMN AND MONONUCLEAR NO ORGANISMS SEEN Performed at [facility]    Culture   Final    NO GROWTH 3 DAYS Performed at [facility]    Report Status 01/17/2015 FINAL  Final  Culture, blood (routine x 2)     Status: None   Collection Time: 01/18/15  6:10  PM  Result Value Ref Range Status   Specimen Description BLOOD LEFT ARM  Final   Special Requests BOTTLES DRAWN AEROBIC AND ANAEROBIC 10CC  Final   Culture   Final    NO GROWTH 5 DAYS Performed at [facility]    Report Status 01/25/2015 FINAL  Final  Culture, blood (routine x 2)     Status: None   Collection Time: 01/18/15  6:18 PM  Result Value Ref Range Status   Specimen Description BLOOD RIGHT ARM  Final   Special Requests BOTTLES DRAWN AEROBIC AND ANAEROBIC 10CC  Final   Culture   Final    NO GROWTH 5 DAYS Performed at [facility]    Report Status 01/25/2015 FINAL  Final  Surgical pcr screen     Status: None   Collection Time: 01/25/15  4:47 AM  Result Value Ref Range Status   MRSA, PCR NEGATIVE NEGATIVE Final   Staphylococcus aureus NEGATIVE NEGATIVE Final    Comment:        The Xpert SA Assay (FDA approved for NASAL specimens in patients over 51 years of age), is one component of a comprehensive surveillance program.  Test performance has been validated by [facility] for patients greater than or equal to 37 year old. It is not intended to diagnose infection nor to guide or monitor treatment.      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 Problems:   Tobacco abuse   Noncompliance   Dehydration   Diarrhea   Diabetes type 2, uncontrolled   Hyperglycemia   Uncontrolled hypertension   Diabetic foot ulcers   Pain   Diabetic ulcer of left foot associated with type 2 diabetes mellitus   Diabetic ulcer of right foot associated with type 2 diabetes mellitus   Osteomyelitis   Shoulder pain   Thigh pain, musculoskeletal   Septic shock   Staphylococcus aureus bacteremia with sepsis   Smoker   Elevated troponin   Acute renal failure   Bacteremia   Septic joint of right shoulder region   Infective myositis of left thigh   Purulent pericarditis   Mitral regurgitation   Endocarditis   Back pain    Poor dentition   Discitis of thoracic region   Congestive heart failure   Leukocytosis   Hypertensive heart disease   Periodontal disease   PVD (peripheral vascular disease)   Septic embolism    Dwayne [name] is a 35 y.o. 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  MSSA endocarditis/bacteremia=  -bacteremia cleared on 1/14, currently on ampicilin (since PCN sensitive), initially had on nafcillin but changed due to aki. Ampicillin would also have good penetrations into cns. Will need 6 wk of IV antibiotics. Likely restart the abtx clock at time of surgery as well. Would send tissue to pcr testing.  - underwent right heart cath on 1/25 that found normal coronary anatomy, mod pulm HTN, and elevated LV filling pressure c/w severe MR  Right shoulder septic arthritis = after heart cath and dental extraction next week, would approach ortho to reassess if we need to do I x D. Synovial aspirate clearly shows that it is c/w septic arthritis. cx were negative since he had been on antibiotics at that time. Would he been fit from arthroscopic wash out?  - leukocytosis = very slowly trending down despite 17 days of antibiotics, worrisome for large burden of disease. His wbc 9 today. Will add differential  - poor dentition = had significant teeth extractions today. Continue with pain management  -acute on chronic kidney disease = staying in the 2.1-2.38. Possibly elevated today from cath procedure yesterday. Will continue monitor   LOS: 16 days   Declyn Delsol 01/25/2015, 3:24 PM

## 2015-01-25 NOTE — Progress Notes (Signed)
PRE-OPERATIVE NOTE:  01/25/2015 Dwayne [name] [id number]  VITALS: BP 140/68 mmHg  Pulse 83  Temp(Src) 98.4 F (36.9 C) (Oral)  Resp 18  Ht 6\' 2"  (1.88 m)  Wt 242 lb 14.4 oz (110.179 kg)  BMI 31.17 kg/m2  SpO2 100%  Lab Results  Component Value Date   WBC 9.0 01/25/2015   HGB 7.3* 01/25/2015   HCT 23.2* 01/25/2015   MCV 87.5 01/25/2015   PLT 384 01/25/2015   BMET    Component Value Date/Time   NA 139 01/25/2015 0505   K 4.0 01/25/2015 0505   CL 104 01/25/2015 0505   CO2 31 01/25/2015 0505   GLUCOSE 132* 01/25/2015 0505   BUN 22 01/25/2015 0505   CREATININE 2.38* 01/25/2015 0505   CREATININE 1.24 03/16/2014 1239   CALCIUM 7.9* 01/25/2015 0505   GFRNONAA 34* 01/25/2015 0505   GFRAA 39* 01/25/2015 0505    Lab Results  Component Value Date   INR 1.22 01/14/2015   No results found for: PTT   Dwayne HickmanBenjamin [name] presents for multiple dental extractions with alveoloplasty, pre-prosthetic surgery, and gross debridement of remaining dentition in the OR with general anesthesia. The patient had cardiac catheterization yesterday with no significant coronary artery disease noted. Hemoglobin of 7.3 was noted this morning and is being followed by cardiology and medical team. The patient is aware that potential blood transfusion may need to be given.  SUBJECTIVE: The patient denies any acute dental changes.  The patient understands and treatment plan for removal of indicated teeth with alveoloplasty and gross debridement of remaining dentition. I will consider removal of bilateral mandibular tori after further evaluation in the operating room and the patient expresses understanding with this plan of care. Patient agrees to proceed with the treatment as discussed and accepts the risks, benefits, and potential complications of the procedures.  EXAM: No sign of acute dental changes.  ASSESSMENT: Patient is affected by bacterial endocarditis, pre-heart valve surgery dental  protocol, chronic apical periodontitis, multiple dental caries, multiple retained root segments, chronic periodontitis, and significant accretions.  PLAN: Patient agrees to proceed with treatment as planned in the operating room as previously discussed and accepts the risks, benefits, and complications of the proposed treatment. Patient is aware of the risk for bleeding, bruising, swelling, infection, pain, nerve damage, soft tissue damage, sinus involvement, root tip fracture, mandible fracture, and the risks of complications associated with the anesthesia. The patient is aware of the potential complication up to and including death to his overall cardiovascular compromise. Patient also is aware of the potential for other complications not mentioned above.   Dwayne [name], DDS

## 2015-01-25 NOTE — Progress Notes (Signed)
[facility] PROGRESS NOTE  Dwayne [name] WJX:[id number] DOB: Nov 06, 1980 DOA: 01/09/2015 PCP: No PCP Per Patient    Brief narrative: Dwayne [name] is a 35 y.o. male with a history of Uncontrolled HTN, DM2 who presents to the ED with complaints of diarrhea and fevers and chills x 24 hours. He reports having weakness and fatigue and He denies any nausea or vomiting. No one is sick at home, and he denies taking any recent antibiotic therapy. He reports that he has been out of his medications for months, and has not seen his PCP in months as well. he was found to have MSSA bacteremia and came in septic shock. He underwent TEE showing MV endocarditis. CTVs consulted and plan for surgery in the next week.He underwent cardiac cath on 1/25  showing normal coronary angiography. Dental medicine consulted and planned for extraction of infected teeth today on 1/26. He will be scheduled for mitral valve surgery as per cardio thoracic surgery.  HPI/Subjective: Pain controlled with increased pain meds.  Wife at bedside and answered all questions.  He appears sleepy.   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  - Infectious disease Dr. Algis Liming recommends 8 weeks of antibiotics post heart surgery and all other potential surgeries - seen by Dr. Barry Dienes with CT surgery-- He would prefer the bacteremia to clear up first, however, he is concerned about the degree his MR worsened in 2 days between TTE and TEE. May need surgery probably next week before he goes in to heart failure. He underwent cardiac catheterization on 1/25, showing normal coronary angiography.   - cardiology also following and assisting with management-  He was initially started on IV lasix for diuresis, stopped, so the  creatnine can improve for the cath. He underwent cardiac cath on 1/25 and was found to have normal coronary anatomy with moderate pulmonary hypertension and with elevated LV filling pressures.   Dental caries - dental medicine consulted for extraction prior to valve replacement. He underwent extraction of multiple infection teeth on 1/26. Pain control.   HTN - BP was better controlled - increased Lopressor to 50 bid and added oral hydralazine- cont to follow at current doses. No more episodes of hypotension.   Poorly controlled diabetes - appears to have a poor understand about the need to control oral glucose/carb intake-  We have spoken with him the dietary restrictions he needs to follow Hemoglobin A1c 8.1. He reportsbeing a diabetic since the age of 41.  Patient admits to being noncompliant with medications He was on 28 units of 70/30 BID, but his cbg's have been borderline and i have decreased his insulin dose to half the dose.  CBG (last 3)   Recent Labs  01/25/15 0824 01/25/15 1157 01/25/15 1321  GLUCAP 115* 167* 150*       Acute renal failure Appears to be been stabilized around 2.  may be related to sepsis and diuresis-. Continue to monitor. .   Right shoulder- septic arthritis/ synovitis : worsened pain today. Better with pain meds.  - s/p aspiration by IR -ID team recommended I and D to resolve infection -  medical management recommended by ortho for now as taking him to the OR with his currently infected valve would be too high risk  Acute osteomyelitis of T3T4 - noted on MRI to have increased  signal which may be osteomyelitis  Myositis L4-L5 area and entire left leg  - noted on MRI  Septic emboli to the brain   Diarrhea C. difficile negative  Anemia: Normocytic , his hemoglobin on admission 1/10 was 11.7, dropped to 8.9 by 1/20.  Patient's hemoglobin slowly decreased from 8.9  to 7.7 by 1/25. No signs of hematochezia or hematuria. Probably from  MV endocarditis. Anemia panel and stool for occult blood sent.  2 units of prbc transfusion ordered for the surgery. Repeat hemoglobin in am.    Hypokalemia: Will be replaced as needed.    Code Status: full Family Communication: none at bedside Disposition Plan:  As above   Consultants:  Cardiothoracic surgery  Dental medicine  Infectious disease  cardiology  Procedures:  Cardiac cath on 1/25  Dental extraction on 1/26  TEE ON 1/14  TTE on 1/12    Antibiotics: Anti-infectives    Start     Dose/Rate Route Frequency Ordered Stop   01/19/15 2000  ampicillin (OMNIPEN) 2 g in sodium chloride 0.9 % 50 mL IVPB     2 g150 mL/hr over 20 Minutes Intravenous 6 times per day 01/19/15 1121     01/18/15 2000  penicillin G potassium 12 Million Units in dextrose 5 % 500 mL continuous infusion     12 Million Units41.7 mL/hr over 12 Hours Intravenous Every 12 hours 01/18/15 1432 01/19/15 2050   01/15/15 1600  nafcillin 2 g in dextrose 5 % 50 mL IVPB  Status:  Discontinued     2 g100 mL/hr over 30 Minutes Intravenous 6 times per day 01/15/15 1503 01/15/15 1517   01/15/15 1600  nafcillin 2 g in dextrose 5 % 50 mL IVPB     2 g100 mL/hr over 30 Minutes Intravenous 6 times per day 01/15/15 1517 01/18/15 1753   01/15/15 0930  nafcillin injection 2 g  Status:  Discontinued     2 g Intravenous Every 4 hours 01/15/15 0921 01/15/15 1503   01/14/15 1500  DAPTOmycin (CUBICIN) 600 mg in sodium chloride 0.9 % IVPB  Status:  Discontinued     600 mg224 mL/hr over 30 Minutes Intravenous Every 48 hours 01/12/15 1421 01/13/15 1239   01/13/15 1500  ceFAZolin (ANCEF) IVPB 2 g/50 mL premix  Status:  Discontinued     2 g100 mL/hr over 30 Minutes Intravenous 3 times per day 01/13/15 1407 01/15/15 0921   01/12/15 1600  ceFAZolin (ANCEF) IVPB 2 g/50 mL premix  Status:  Discontinued     2 g100 mL/hr over 30 Minutes Intravenous Every 12 hours 01/12/15 1419 01/13/15 1407   01/11/15 1200  DAPTOmycin (CUBICIN)  600 mg in sodium chloride 0.9 % IVPB  Status:  Discontinued     600 mg224 mL/hr over 30 Minutes Intravenous Every 24 hours 01/11/15 1003 01/12/15 1421   01/10/15 1200  vancomycin (VANCOCIN) IVPB 1000 mg/200 mL premix  Status:  Discontinued     1,000 mg200 mL/hr over 60 Minutes Intravenous Every 8 hours 01/10/15 1044 01/11/15 0947   01/10/15 1100  piperacillin-tazobactam (ZOSYN) IVPB 3.375 g  Status:  Discontinued     3.375 g12.5 mL/hr over 240 Minutes Intravenous Every 8 hours 01/10/15 1043 01/11/15 0902   01/09/15 0700  metroNIDAZOLE (FLAGYL) IVPB 500 mg  Status:  Discontinued     500 mg100 mL/hr over 60 Minutes Intravenous Every 8 hours 01/09/15 0651 01/10/15 1018       Objective: Filed Vitals:   01/25/15 1215 01/25/15 1230 01/25/15 1237  01/25/15 1328  BP: 174/83  178/82 171/90  Pulse: 81 79 81 86  Temp:   98.1 F (36.7 C) 98.3 F (36.8 C)  TempSrc:    Axillary  Resp: Height:      Weight:      SpO2: 94% 96% 97% 93%    Intake/Output Summary (Last 24 hours) at 01/25/15 1457 Last data filed at 01/25/15 1200  Gross per 24 hour  Intake   1920 ml  Output   2675 ml  Net   -755 ml    Exam:  General: alert  Not in any distress.  Cardiovascular: RRR, nl S1 s2 + murmur 2+ at right sternal border. Respiratory: Decreased breath sounds at the bases, scattered rhonchi, no crackles  Abdomen: soft +BS NT/ND, no masses palpable  Extremities: No cyanosis 3+ edema. Painful right shoulder on movement. Unable to raise the right shoulder.       Data Reviewed: Basic Metabolic Panel:  Recent Labs Lab 01/21/15 0418 01/22/15 1232 01/23/15 0307 01/24/15 0450 01/25/15 0505  NA 134* 133* 134* 136 139  K 3.9 3.8 4.1 4.0 4.0  CL 95* 93* 95* 100 104  CO2 GLUCOSE 131* 97 164* 198* 132*  BUN 19 20 26* 22 22  CREATININE 2.12* 2.16* 2.27* 2.10* 2.38*  CALCIUM 8.0* 8.4 8.3* 8.1* 7.9*    Liver Function Tests:  Recent Labs Lab 01/19/15 0321  AST 17  ALT  13  ALKPHOS 185*  BILITOT 0.9  PROT 5.8*  ALBUMIN 1.2*   No results for input(s): LIPASE, AMYLASE in the last 168 hours. No results for input(s): AMMONIA in the last 168 hours.  CBC:  Recent Labs Lab 01/20/15 0433 01/20/15 1602 01/21/15 0418 01/22/15 0347 01/24/15 0450 01/25/15 0505  WBC 14.3*  --  15.4* 10.9* 10.1 9.0  NEUTROABS 12.2* 11.7*  --   --   --   --   HGB 9.3*  --  8.7* 8.2* 7.7* 7.3*  HCT 28.6*  --  27.2* 25.9* 25.0* 23.2*  MCV 88.3  --  89.2 87.8 88.3 87.5  PLT 467*  --  458* 421* 409* 384    Cardiac Enzymes:  Recent Labs Lab 01/19/15 0321  CKTOTAL 152   BNP (last 3 results) No results for input(s): PROBNP in the last 8760 hours.   CBG:  Recent Labs Lab 01/24/15 2028 01/25/15 0615 01/25/15 0824 01/25/15 1157 01/25/15 1321  GLUCAP 165* 125* 115* 167* 150*    Imaging results reviewed in detail.    Scheduled Meds: . sodium chloride   Intravenous Once  . ampicillin (OMNIPEN) IV  2 g Intravenous 6 times per day  . chlorhexidine  15 mL Mouth/Throat BID  . feeding supplement (PRO-STAT SUGAR FREE 64)  30 mL Oral TID WC  . fentaNYL      . hydrALAZINE  25 mg Oral 3 times per day  . Influenza vac split quadrivalent PF  0.5 mL Intramuscular Tomorrow-1000  . insulin aspart  0-15 Units Subcutaneous QAC lunch  . insulin aspart  0-5 Units Subcutaneous QHS  . insulin aspart protamine- aspart  14 Units Subcutaneous BID WC  . living well with diabetes book   Does not apply Once  . metoprolol tartrate  50 mg Oral BID  . neomycin-bacitracin-polymyxin   Topical Daily  . nicotine  7 mg Transdermal Daily  . polyethylene glycol  17 g Oral Daily  . senna-docusate  2 tablet Oral BID  Continuous Infusions: . sodium chloride 10 mL/hr at 01/25/15 0924  . sodium chloride    . [START ON 01/26/2015] sodium chloride irrigation      Time spent: 30 minutes, which includes 50% of the time with face-to-face with patient and coordinating care related to the above  assessment and plan.   [facility]  [facility] [phone] Pager-  [email or web address], password [facility] 01/25/2015, 2:57 PM  LOS: 16 days

## 2015-01-25 NOTE — Discharge Instructions (Signed)

## 2015-01-25 NOTE — Transfer of Care (Signed)
Immediate Anesthesia Transfer of Care Note  Patient: Dwayne HickmanBenjamin [name]  Procedure(s) Performed: Procedure(s): Extraction of tooth #'s 1,2,3,4,5,6,8,9,11,12,13,14,15,16,17,18,19 [id number] with alveoloplasty and gross debridement of remaining teeth. (N/A)  Patient Location: PACU  Anesthesia Type:General  Level of Consciousness: awake, oriented and pateint uncooperative  Airway & Oxygen Therapy: Patient Spontanous Breathing and Patient connected to nasal cannula oxygen  Post-op Assessment: Report given to PACU RN and Post -op Vital signs reviewed and stable  Post vital signs: Reviewed  Complications: No apparent anesthesia complications

## 2015-01-25 NOTE — Anesthesia Preprocedure Evaluation (Addendum)
Anesthesia Evaluation  Patient identified by MRN, date of birth, ID band Patient awake    Reviewed: Allergy & Precautions, NPO status , Patient's Chart, lab work & pertinent test results, reviewed documented beta blocker date and time   History of Anesthesia Complications Negative for: history of anesthetic complications  Airway Mallampati: II  TM Distance: >3 FB Neck ROM: Full    Dental  (+) Poor Dentition, Missing, Loose, Chipped, Dental Advisory Given   Pulmonary Current Smoker,    + decreased breath sounds      Cardiovascular hypertension, Pt. 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     Musculoskeletal   Abdominal   Peds  Hematology  (+) anemia , 7.1/23   Anesthesia Other Findings   Reproductive/Obstetrics negative OB ROS                            Anesthesia Physical Anesthesia Plan  ASA: III  Anesthesia Plan: General   Post-op Pain Management:    Induction: Intravenous  Airway Management Planned: Oral ETT and Nasal ETT  Additional Equipment:   Intra-op Plan:   Post-operative Plan: Extubation in OR  Informed Consent: I have reviewed the patients History and Physical, chart, labs and discussed the procedure including the risks, benefits and alternatives for the proposed anesthesia with the patient or authorized representative who has indicated his/her understanding and acceptance.     Plan Discussed with:   Anesthesia Plan Comments:         Anesthesia Quick Evaluation

## 2015-01-26 ENCOUNTER — Inpatient Hospital Stay (HOSPITAL_COMMUNITY): Payer: Medicaid Other

## 2015-01-26 DIAGNOSIS — I509 Heart failure, unspecified: Secondary | ICD-10-CM

## 2015-01-26 DIAGNOSIS — I2690 Septic pulmonary embolism without acute cor pulmonale: Secondary | ICD-10-CM

## 2015-01-26 DIAGNOSIS — I110 Hypertensive heart disease with heart failure: Secondary | ICD-10-CM

## 2015-01-26 DIAGNOSIS — I340 Nonrheumatic mitral (valve) insufficiency: Secondary | ICD-10-CM

## 2015-01-26 DIAGNOSIS — K088 Other specified disorders of teeth and supporting structures: Secondary | ICD-10-CM

## 2015-01-26 LAB — CK: Total CK: 166 U/L (ref 7–232)

## 2015-01-26 LAB — CBC
HCT: 28.2 % — ABNORMAL LOW (ref 39.0–52.0)
Hemoglobin: 9.2 g/dL — ABNORMAL LOW (ref 13.0–17.0)
MCH: 27.8 pg (ref 26.0–34.0)
MCHC: 32.6 g/dL (ref 30.0–36.0)
MCV: 85.2 fL (ref 78.0–100.0)
PLATELETS: 430 10*3/uL — AB (ref 150–400)
RBC: 3.31 MIL/uL — ABNORMAL LOW (ref 4.22–5.81)
RDW: 13.8 % (ref 11.5–15.5)
WBC: 10.6 10*3/uL — AB (ref 4.0–10.5)

## 2015-01-26 LAB — GLUCOSE, CAPILLARY
GLUCOSE-CAPILLARY: 237 mg/dL — AB (ref 70–99)
GLUCOSE-CAPILLARY: 144 mg/dL — AB (ref 70–99)
Glucose-Capillary: 200 mg/dL — ABNORMAL HIGH (ref 70–99)
Glucose-Capillary: 84 mg/dL (ref 70–99)

## 2015-01-26 LAB — BASIC METABOLIC PANEL
Anion gap: 9 (ref 5–15)
BUN: 22 mg/dL (ref 6–23)
CALCIUM: 8.5 mg/dL (ref 8.4–10.5)
CHLORIDE: 99 mmol/L (ref 96–112)
CO2: 26 mmol/L (ref 19–32)
Creatinine, Ser: 2.42 mg/dL — ABNORMAL HIGH (ref 0.50–1.35)
GFR calc Af Amer: 38 mL/min — ABNORMAL LOW (ref >90)
GFR calc non Af Amer: 33 mL/min — ABNORMAL LOW (ref >90)
GLUCOSE: 232 mg/dL — AB (ref 70–99)
Potassium: 4.1 mmol/L (ref 3.5–5.1)
SODIUM: 134 mmol/L — AB (ref 135–145)

## 2015-01-26 LAB — IRON AND TIBC
IRON: 16 ug/dL — AB (ref 42–165)
SATURATION RATIOS: 10 % — AB (ref 20–55)
TIBC: 153 ug/dL — AB (ref 215–435)
UIBC: 137 ug/dL (ref 125–400)

## 2015-01-26 LAB — FOLATE: FOLATE: 6.9 ng/mL

## 2015-01-26 LAB — FERRITIN: Ferritin: 436 ng/mL — ABNORMAL HIGH (ref 22–322)

## 2015-01-26 MED ORDER — METOPROLOL TARTRATE 50 MG PO TABS
75.0000 mg | ORAL_TABLET | Freq: Two times a day (BID) | ORAL | Status: DC
  Administered 2015-01-26 – 2015-01-28 (×5): 75 mg via ORAL
  Filled 2015-01-26 (×6): qty 1

## 2015-01-26 MED ORDER — INSULIN ASPART 100 UNIT/ML ~~LOC~~ SOLN
0.0000 [IU] | Freq: Three times a day (TID) | SUBCUTANEOUS | Status: DC
  Administered 2015-01-26 – 2015-01-27 (×3): 2 [IU] via SUBCUTANEOUS
  Administered 2015-01-28: 3 [IU] via SUBCUTANEOUS

## 2015-01-26 MED ORDER — FUROSEMIDE 10 MG/ML IJ SOLN
80.0000 mg | Freq: Two times a day (BID) | INTRAMUSCULAR | Status: AC
  Administered 2015-01-26 (×2): 80 mg via INTRAVENOUS
  Filled 2015-01-26 (×3): qty 8

## 2015-01-26 NOTE — Progress Notes (Signed)
Inpatient Diabetes Program Recommendations  AACE/ADA: New Consensus Statement on Inpatient Glycemic Control (2013)  Target Ranges:  Prepandial:   less than 140 mg/dL      Peak postprandial:   less than 180 mg/dL (1-2 hours)      Critically ill patients:  140 - 180 mg/dL  Results for Dwayne [name], Dwayne [name] (MRN [id number]) as of 01/26/2015 11:19  Ref. Range 01/25/2015 13:21 01/25/2015 16:11 01/25/2015 21:15 01/26/2015 06:26  Glucose-Capillary Latest Range: 70-99 mg/dL 629150 (H) 528298 (H) 413281 (H) 237 (H)   Inpatient Diabetes Program Recommendations Correction (SSI): change Novolog to TID instead of NOON only Thank you  Piedad ClimesGina Lucresia Simic BSN, RN,CDE Inpatient Diabetes Coordinator [phone] (team pager)

## 2015-01-26 NOTE — Progress Notes (Signed)
[facility]  Day #18 abtx, Day #8 ampicillin  Subjective: Still having significant jaw swelling from recent dental extracted yesterday. Has good pain control Antibiotics:  Anti-infectives    Start     Dose/Rate Route Frequency Ordered Stop   01/19/15 2000  ampicillin (OMNIPEN) 2 g in sodium chloride 0.9 % 50 mL IVPB     2 g150 mL/hr over 20 Minutes Intravenous 6 times per day 01/19/15 1121     01/18/15 2000  penicillin G potassium 12 Million Units in dextrose 5 % 500 mL continuous infusion     12 Million Units41.7 mL/hr over 12 Hours Intravenous Every 12 hours 01/18/15 1432 01/19/15 2050   01/15/15 1600  nafcillin 2 g in dextrose 5 % 50 mL IVPB  Status:  Discontinued     2 g100 mL/hr over 30 Minutes Intravenous 6 times per day 01/15/15 1503 01/15/15 1517   01/15/15 1600  nafcillin 2 g in dextrose 5 % 50 mL IVPB     2 g100 mL/hr over 30 Minutes Intravenous 6 times per day 01/15/15 1517 01/18/15 1753   01/15/15 0930  nafcillin injection 2 g  Status:  Discontinued     2 g Intravenous Every 4 hours 01/15/15 0921 01/15/15 1503   01/14/15 1500  DAPTOmycin (CUBICIN) 600 mg in sodium chloride 0.9 % IVPB  Status:  Discontinued     600 mg224 mL/hr over 30 Minutes Intravenous Every 48 hours 01/12/15 1421 01/13/15 1239   01/13/15 1500  ceFAZolin (ANCEF) IVPB 2 g/50 mL premix  Status:  Discontinued     2 g100 mL/hr over 30 Minutes Intravenous 3 times per day 01/13/15 1407 01/15/15 0921   01/12/15 1600  ceFAZolin (ANCEF) IVPB 2 g/50 mL premix  Status:  Discontinued     2 g100 mL/hr over 30 Minutes Intravenous Every 12 hours 01/12/15 1419 01/13/15 1407   01/11/15 1200  DAPTOmycin (CUBICIN) 600 mg in sodium chloride 0.9 % IVPB  Status:  Discontinued     600 mg224 mL/hr over 30 Minutes Intravenous Every 24 hours 01/11/15 1003 01/12/15 1421   01/10/15 1200  vancomycin (VANCOCIN) IVPB 1000 mg/200 mL premix  Status:  Discontinued     1,000 mg200 mL/hr over 60 Minutes  Intravenous Every 8 hours 01/10/15 1044 01/11/15 0947   01/10/15 1100  piperacillin-tazobactam (ZOSYN) IVPB 3.375 g  Status:  Discontinued     3.375 g12.5 mL/hr over 240 Minutes Intravenous Every 8 hours 01/10/15 1043 01/11/15 0902   01/09/15 0700  metroNIDAZOLE (FLAGYL) IVPB 500 mg  Status:  Discontinued     500 mg100 mL/hr over 60 Minutes Intravenous Every 8 hours 01/09/15 0651 01/10/15 1018      Medications: Scheduled Meds: . ampicillin (OMNIPEN) IV  2 g Intravenous 6 times per day  . chlorhexidine  15 mL Mouth/Throat BID  . feeding supplement (PRO-STAT SUGAR FREE 64)  30 mL Oral Q24H  . feeding supplement (RESOURCE BREEZE)  1 Container Oral BID PC  . furosemide  80 mg Intravenous BID  . hydrALAZINE  25 mg Oral 3 times per day  . Influenza vac split quadrivalent PF  0.5 mL Intramuscular Tomorrow-1000  . insulin aspart  0-15 Units Subcutaneous TID WC  . insulin aspart  0-5 Units Subcutaneous QHS  . insulin aspart protamine- aspart  14 Units Subcutaneous BID WC  . living well with diabetes book   Does not apply Once  . metoprolol tartrate  75 mg Oral BID  .  neomycin-bacitracin-polymyxin   Topical Daily  . nicotine  7 mg Transdermal Daily  . polyethylene glycol  17 g Oral Daily  . senna-docusate  2 tablet Oral BID    Objective: Weight change: 5 lb 8.9 oz (2.521 kg)  Intake/Output Summary (Last 24 hours) at 01/26/15 1436 Last data filed at 01/26/15 1300  Gross per 24 hour  Intake   1800 ml  Output   1300 ml  Net    500 ml   Blood pressure 160/90, pulse 82, temperature 98.2 F (36.8 C), temperature source Oral, resp. rate 18, height 6' 2"  (1.88 m), weight 248 lb 7.3 oz (112.7 kg), SpO2 98 %. Temp:  [98 F (36.7 C)-99.4 F (37.4 C)] 98.2 F (36.8 C) (01/27 1356) Pulse Rate:  [82-98] 82 (01/27 1356) Resp:  [18-28] 18 (01/27 1356) BP: (160-175)/(86-98) 160/90 mmHg (01/27 1356) SpO2:  [91 %-100 %] 98 % (01/27 1356) Weight:  [248 lb 7.3 oz (112.7 kg)] 248 lb 7.3 oz (112.7 kg)  (01/27 0330)  Physical Exam: BP 160/90 mmHg  Pulse 82  Temp(Src) 98.2 F (36.8 C) (Oral)  Resp 18  Ht 6' 2"  (1.88 m)  Wt 248 lb 7.3 oz (112.7 kg)  BMI 31.89 kg/m2  SpO2 98%  Physical Exam  Constitutional: He is oriented to person, sedated from pain medication sitting on edge of bed. No distress.  HENT:  Mouth/Throat: swollen gum, blood tinged gauze. Still moderate swelling to left side jaw Cardiovascular: Normal rate, regular rhythm and normal heart sounds. Nl s1, s2, 1/2 HSM at apex Pulmonary/Chest: Effort normal and breath sounds normal. No respiratory distress. He has no wheezes.  Abdominal: Soft. Bowel sounds are normal. He exhibits no distension. There is no tenderness.  Lymphadenopathy:  He has no cervical adenopathy.  Skin: Skin is warm and dry. No rash noted. No erythema.     CBC:   CBC Latest Ref Rng 01/26/2015 01/25/2015 01/24/2015  WBC 4.0 - 10.5 K/uL 10.6(H) 9.0 10.1  Hemoglobin 13.0 - 17.0 g/dL 9.2(L) 7.3(L) 7.7(L)  Hematocrit 39.0 - 52.0 % 28.2(L) 23.2(L) 25.0(L)  Platelets 150 - 400 K/uL 430(H) 384 409(H)     BMET  Recent Labs  01/25/15 0505 01/26/15 0748  NA 139 134*  K 4.0 4.1  CL 104 99  CO2 31 26  GLUCOSE 132* 232*  BUN 22 22  CREATININE 2.38* 2.42*  CALCIUM 7.9* 8.5     Liver Panel  No results for input(s): PROT, ALBUMIN, AST, ALT, ALKPHOS, BILITOT, BILIDIR, IBILI in the last 72 hours.  Micro Results: Recent Results (from the past 720 hour(s))  Urine culture     Status: None   Collection Time: 01/09/15  1:31 AM  Result Value Ref Range Status   Specimen Description URINE, CLEAN CATCH  Final   Special Requests NONE  Final   Colony Count   Final    3,000 COLONIES/ML Performed at [facility]    Culture   Final    INSIGNIFICANT GROWTH Performed at [facility]    Report Status 01/10/2015 FINAL  Final  Stool culture     Status: None   Collection Time: 01/09/15  4:09 AM  Result Value Ref Range Status   Specimen  Description VAGINAL/RECTAL  Final   Special Requests Normal  Final   Culture   Final    NO SALMONELLA, SHIGELLA, CAMPYLOBACTER, YERSINIA, OR E.COLI 0157:H7 ISOLATED Performed at [facility]    Report Status 01/12/2015 FINAL  Final  Clostridium Difficile  by PCR     Status: None   Collection Time: 01/09/15  4:09 AM  Result Value Ref Range Status   C difficile by pcr NEGATIVE NEGATIVE Final  Culture, blood (routine x 2)     Status: None   Collection Time: 01/10/15  4:35 AM  Result Value Ref Range Status   Specimen Description BLOOD RIGHT HAND  Final   Special Requests BOTTLES DRAWN AEROBIC AND ANAEROBIC 10CC EA  Final   Culture   Final    STAPHYLOCOCCUS AUREUS Note: SUSCEPTIBILITIES PERFORMED ON PREVIOUS CULTURE WITHIN THE LAST 5 DAYS. Note: Culture results may be compromised due to an excessive volume of blood received in culture bottles. Gram Stain Report Called to,Read Back By and Verified With:  MONICA YIM ON 01/10/2015 AT 11:36P BY WILEJ Performed at [facility]    Report Status 01/13/2015 FINAL  Final  Culture, blood (routine x 2)     Status: None   Collection Time: 01/10/15  4:46 AM  Result Value Ref Range Status   Specimen Description BLOOD LEFT ARM  Final   Special Requests BOTTLES DRAWN AEROBIC AND ANAEROBIC 10CC EA  Final   Culture   Final    STAPHYLOCOCCUS AUREUS Note: SUSCEPTIBILITIES PERFORMED ON PREVIOUS CULTURE WITHIN THE LAST 5 DAYS. Note: Culture results may be compromised due to an excessive volume of blood received in culture bottles. Gram Stain Report Called to,Read Back By and Verified With: MONICA YIM ON 01/10/2015 AT 11:36P BY WILEJ Performed at [facility]    Report Status 01/13/2015 FINAL  Final  Respiratory virus panel (routine influenza)     Status: None   Collection Time: 01/10/15 10:34 AM  Result Value Ref Range Status   Source - RVPAN NASAL SWAB  Corrected    Comment: CORRECTED ON 01/12 AT 2036: PREVIOUSLY REPORTED AS NASAL  SWAB   Respiratory Syncytial Virus A NOT DETECTED  Final   Respiratory Syncytial Virus B NOT DETECTED  Final   Influenza A NOT DETECTED  Final   Influenza B NOT DETECTED  Final   Parainfluenza 1 NOT DETECTED  Final   Parainfluenza 2 NOT DETECTED  Final   Parainfluenza 3 NOT DETECTED  Final   Metapneumovirus NOT DETECTED  Final   Rhinovirus NOT DETECTED  Final   Adenovirus NOT DETECTED  Final   Influenza A H1 NOT DETECTED  Final   Influenza A H3 NOT DETECTED  Final    Comment: (NOTE)       Normal Reference Range for each Analyte: NOT DETECTED Testing performed using the Luminex xTAG Respiratory Viral Panel test kit. The analytical performance characteristics of this assay have been determined by [facility].  The modifications have not been cleared or approved by the FDA. This assay has been validated pursuant to the CLIA regulations and is used for clinical purposes. Performed at [facility], blood (routine x 2)     Status: None   Collection Time: 01/10/15 11:19 AM  Result Value Ref Range Status   Specimen Description BLOOD LEFT FOREARM  Final   Special Requests BOTTLES DRAWN AEROBIC ONLY 5CC  Final   Culture   Final    STAPHYLOCOCCUS AUREUS Note: RIFAMPIN AND GENTAMICIN SHOULD NOT BE USED AS SINGLE DRUGS FOR TREATMENT OF STAPH INFECTIONS. Note: Gram Stain Report Called to,Read Back By and Verified With: NURSE MONICA YIM 01/11/15 6:05AM THOMI Performed at [facility]    Report Status 01/13/2015 FINAL  Final  Organism ID, Bacteria STAPHYLOCOCCUS AUREUS  Final      Susceptibility   Staphylococcus aureus - MIC*    CLINDAMYCIN <=0.25 SENSITIVE Sensitive     ERYTHROMYCIN <=0.25 SENSITIVE Sensitive     GENTAMICIN <=0.5 SENSITIVE Sensitive     LEVOFLOXACIN 4 INTERMEDIATE Intermediate     OXACILLIN 0.5 SENSITIVE Sensitive     PENICILLIN 0.12 SENSITIVE Sensitive     RIFAMPIN 2 INTERMEDIATE Intermediate     TRIMETH/SULFA <=10 SENSITIVE Sensitive      VANCOMYCIN 1 SENSITIVE Sensitive     TETRACYCLINE <=1 SENSITIVE Sensitive     MOXIFLOXACIN 2 RESISTANT Resistant     * STAPHYLOCOCCUS AUREUS  Culture, blood (routine x 2)     Status: None   Collection Time: 01/10/15 11:27 AM  Result Value Ref Range Status   Specimen Description BLOOD LEFT ANTECUBITAL  Final   Special Requests BOTTLES DRAWN AEROBIC ONLY [place]  Final   Culture   Final    STAPHYLOCOCCUS AUREUS Note: SUSCEPTIBILITIES PERFORMED ON PREVIOUS CULTURE WITHIN THE LAST 5 DAYS. Note: Gram Stain Report Called to,Read Back By and Verified With: NURSE MONICA YIM 01/11/15 6:05AM THOMI Performed at [facility]    Report Status 01/13/2015 FINAL  Final  Culture, blood (routine x 2)     Status: None   Collection Time: 01/13/15  7:20 PM  Result Value Ref Range Status   Specimen Description BLOOD RIGHT ARM  Final   Special Requests BOTTLES DRAWN AEROBIC AND ANAEROBIC 5CC  Final   Culture   Final    NO GROWTH 5 DAYS Performed at [facility]    Report Status 01/20/2015 FINAL  Final  Culture, blood (routine x 2)     Status: None   Collection Time: 01/13/15  7:20 PM  Result Value Ref Range Status   Specimen Description BLOOD RIGHT ARM  Final   Special Requests BOTTLES DRAWN AEROBIC AND ANAEROBIC 3CC  Final   Culture   Final    NO GROWTH 5 DAYS Performed at [facility]    Report Status 01/20/2015 FINAL  Final  Body fluid culture     Status: None   Collection Time: 01/14/15  9:02 AM  Result Value Ref Range Status   Specimen Description FLUID RIGHT SHOULDER SYNOVIAL  Final   Special Requests NONE  Final   Gram Stain   Final    FEW WBC PRESENT,BOTH PMN AND MONONUCLEAR NO ORGANISMS SEEN Performed at [facility]    Culture   Final    NO GROWTH 3 DAYS Performed at [facility]    Report Status 01/17/2015 FINAL  Final  Culture, blood (routine x 2)     Status: None   Collection Time: 01/18/15  6:10 PM  Result Value Ref Range Status    Specimen Description BLOOD LEFT ARM  Final   Special Requests BOTTLES DRAWN AEROBIC AND ANAEROBIC 10CC  Final   Culture   Final    NO GROWTH 5 DAYS Performed at [facility]    Report Status 01/25/2015 FINAL  Final  Culture, blood (routine x 2)     Status: None   Collection Time: 01/18/15  6:18 PM  Result Value Ref Range Status   Specimen Description BLOOD RIGHT ARM  Final   Special Requests BOTTLES DRAWN AEROBIC AND ANAEROBIC 10CC  Final   Culture   Final    NO GROWTH 5 DAYS Performed at [facility]    Report Status 01/25/2015 FINAL  Final  Surgical pcr screen     Status: None   Collection Time: 01/25/15  4:47 AM  Result Value Ref Range Status   MRSA, PCR NEGATIVE NEGATIVE Final   Staphylococcus aureus NEGATIVE NEGATIVE Final    Comment:        The Xpert SA Assay (FDA approved for NASAL specimens in patients over 110 years of age), is one component of a comprehensive surveillance program.  Test performance has been validated by [facility] for patients greater than or equal to 5 year old. It is not intended to diagnose infection nor to guide or monitor treatment.      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 Problems:   Tobacco abuse   Noncompliance   Dehydration   Diarrhea   Diabetes type 2, uncontrolled   Hyperglycemia   Uncontrolled hypertension   Diabetic foot ulcers   Pain   Diabetic ulcer of left foot associated with type 2 diabetes mellitus   Diabetic ulcer of right foot associated with type 2 diabetes mellitus   Osteomyelitis   Shoulder pain   Thigh pain, musculoskeletal   Septic shock   Staphylococcus aureus bacteremia with sepsis   Smoker   Elevated troponin   Acute renal failure   Bacteremia   Septic joint of right shoulder region   Infective myositis of left thigh   Purulent pericarditis   Mitral regurgitation   Endocarditis   Back pain   Poor dentition   Discitis of thoracic  region   Congestive heart failure   Leukocytosis   Hypertensive heart disease   Periodontal disease   PVD (peripheral vascular disease)   Septic embolism    Amedee Cerrone is a 35 y.o. 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  MSSA endocarditis/bacteremia=  -bacteremia cleared on 1/14, currently on ampicilin (since PCN sensitive), initially had on nafcillin but changed due to aki. Ampicillin would also have good penetrations into cns. Will need 6 wk of IV antibiotics. Likely restart the abtx clock at time of surgery as well. Would send tissue to pcr testing.  - underwent right heart cath on 1/25 that found normal coronary anatomy, mod pulm HTN, and elevated LV filling pressure c/w severe MR  Right shoulder septic arthritis = . Would ask orhto if patient would benefit from arthroscopic wash out? Prior to heart surgery since arthrocentesis c/w septic arthritis. Would not want any other sources complicating upcoming valve surgery  - leukocytosis = very slowly trending down despite 17 days of antibiotics, worrisome for large burden of disease. Will continue to monitor cbc  - poor dentition = had significant teeth extractions yesterday. Continue with pain management  -acute on chronic kidney disease = staying in the 2.1-2.38. Possibly elevated today from recent cath and decreased po intake from dental procedure. Will continue monitor   LOS: 17 days   Apphia Cropley 01/26/2015, 2:36 PM

## 2015-01-26 NOTE — Progress Notes (Addendum)
[address]       [place] [id number]             [phone]   Dwayne [name]  CARDIOTHORACIC SURGERY PROGRESS NOTE  1 Day Post-Op  S/P Procedure(s) (LRB): Extraction of tooth #'s 1,2,3,4,5,6,8,9,11,12,13,14,15,16,17,18,19 [phone] with alveoloplasty and gross debridement of remaining teeth. (N/A)  Subjective: Patient is not very interactive, but he reports his biggest complaint is expected soreness in mouth.  Still having some right shoulder discomfort, but he thinks this has improved and he can move right shoulder reasonably well.  He denies SOB  Objective: Vital signs in last 24 hours: Temp:  [98 F (36.7 C)-99.4 F (37.4 C)] 98.2 F (36.8 C) (01/27 1356) Pulse Rate:  [82-98] 82 (01/27 1356) Cardiac Rhythm:  [-] Normal sinus rhythm (01/27 0815) Resp:  [18-28] 18 (01/27 1356) BP: (160-175)/(86-98) 160/90 mmHg (01/27 1356) SpO2:  [91 %-100 %] 98 % (01/27 1356) Weight:  [112.7 kg (248 lb 7.3 oz)] 112.7 kg (248 lb 7.3 oz) (01/27 0330)  Physical Exam:  Rhythm:   sinus  Breath sounds: Bibasilar inspiratory crackles w/out wheezing  Heart sounds:  RRR w/ holosystolic murmur  Incisions:  n/a  Abdomen:  Soft, non-distended, non-tender  Extremities:  Warm, well-perfused, 3+ bilateral LE edema   Intake/Output from previous day: 01/26 0701 - 01/27 0700 In: 2200 [P.O.:600; I.V.:1200; Blood:400] Out: 600 [Urine:550; Blood:50] Intake/Output this shift: Total I/O In: 800 [P.O.:600; Blood:200] Out: 750 [Urine:750]  Lab Results:  Recent Labs  01/25/15 0505 01/26/15 0748  WBC 9.0 10.6*  HGB 7.3* 9.2*  HCT 23.2* 28.2*  PLT 384 430*   BMET:  Recent Labs  01/25/15 0505 01/26/15 0748  NA 139 134*  K 4.0 4.1  CL 104 99  CO2 31 26  GLUCOSE 132* 232*  BUN 22 22  CREATININE 2.38* 2.42*  CALCIUM 7.9* 8.5    CBG (last 3)   Recent Labs  01/25/15 2115 01/26/15 0626 01/26/15 1123  GLUCAP 281* 237* 200*   PT/INR:  No results for input(s): LABPROT, INR in the  last 72 hours.  CXR:  PORTABLE CHEST - 1 VIEW  COMPARISON: 01/22/2015  FINDINGS: Cardiac enlargement without vascular congestion. Shallow inspiration with mild atelectasis in the lung bases. Small bilateral pleural effusion. No pneumothorax. No change since prior study.  IMPRESSION: Cardiac enlargement. Atelectasis in the lung bases. Small bilateral pleural effusion.   Electronically Signed  By: Burman NievesWilliam Stevens M.D.  On: 01/26/2015 05:55    Cardiac Catheterization Procedure Note  Name: Dwayne [name] MRN: [id number] DOB: 04/10/1980  Procedure: Right Heart Cath, LV angiography  Indication: 35 yo BM with endocarditis of the MV with severe MR.  Procedural Details: The right groin was prepped, draped, and anesthetized with 1% lidocaine. Using the modified Seldinger technique a 5 Fr sheath was placed in the right femoral artery and a 7 French sheath was placed in the right femoral vein. A Swan-Ganz catheter was used for the right heart catheterization. Standard protocol was followed for recording of right heart pressures and sampling of oxygen saturations. Fick cardiac output was calculated. Standard Judkins catheters were used for selective coronary angiography. There were no immediate procedural complications. The patient was transferred to the post catheterization recovery area for further monitoring. 40 cc of contrast used.  Procedural Findings: Hemodynamics RA 16/18 mean 12 mm Hg RV 57/18 mm Hg PA 56/22 mean 38 mm Hg PCWP 22/43 mean 24 mm Hg, large V wave LV Not done AO  151/95 mean 119 mm Hg  Oxygen saturations: PA 49% AO 90%  Cardiac Output (Fick) 7.3 L/min  Cardiac Index (Fick) 3.1 L/min/meter squared.  Coronary angiography: Coronary dominance: right  Left mainstem: Normal   Left anterior descending (LAD): Normal  Left circumflex (LCx): Normal  Right coronary artery (RCA): Normal.     Final Conclusions:  1. Normal  coronary anatomy 2. Moderate pulmonary HTN 3. Elevated LV filling pressures with large V wave consistent with severe MR.  Recommendations: Continue medical therapy in anticipation of MV surgery.   Peter [place], MDFACC 01/24/2015, 1:59 PM              Assessment/Plan:  [name] remains stable from cardiac standpoint.  He does appear more volume-overloaded today, although patient received 2 units PRBC's yesterday, I/O's postitive, weight up today.  pCXR yesterday w/out edema.  WBC < 11k for last 5 days and no fevers.  At present there remain no indications for urgent mitral valve repair/replacement, although he will clearly need surgery at some point.  Results of cath noted.  Will continue to follow.  I spent in excess of 15 minutes during the conduct of this hospital encounter and >50% of this time involved direct face-to-face encounter with the patient for counseling and/or coordination of their care.   OWEN,CLARENCE H 01/26/2015 3:59 PM

## 2015-01-26 NOTE — Progress Notes (Signed)
Patient: Dwayne HickmanBenjamin [name] / Admit Date: 01/09/2015 / Date of Encounter: 01/26/2015, 9:02 AM   Subjective: He indicates oral pain, indicates he feels "so-so" by hand motion. His girlfriend says last night he had an episode of feeling like his heart was racing, SOB, and vomiting. Tele stable, HR 90s.   Objective: Telemetry: NSR Physical Exam: Blood pressure 164/86, pulse 90, temperature 98.4 F (36.9 C), temperature source Oral, resp. rate 28, height 6\' 2"  (1.88 m), weight 248 lb 7.3 oz (112.7 kg), SpO2 95 %. General: Well developed appearing M in no acute distress. Head: Normocephalic, atraumatic, sclera non-icteric, no xanthomas, nares are without discharge. Neck: JVP not elevated. Lungs: Decreased BS at bases, crackles 1/2 way up bilaterally. No wheezes or rhonchi. Breathing is unlabored. Heart: RRR S1 S2 with soft systolic murmur at apex, no rubs, or gallops.  Abdomen: Soft, non-tender, non-distended with normoactive bowel sounds. No rebound/guarding. Extremities: 1+ BLE pedal edema. Right groin site without hematoma, ecchymosis or bruit. Neuro: A+Ox3 but appears tired. Psych: Flat affect. Does not appear eager to participate in our interaction.   Intake/Output Summary (Last 24 hours) at 01/26/15 0902 Last data filed at 01/26/15 0715  Gross per 24 hour  Intake   2400 ml  Output    600 ml  Net   1800 ml    Inpatient Medications:  . ampicillin (OMNIPEN) IV  2 g Intravenous 6 times per day  . chlorhexidine  15 mL Mouth/Throat BID  . feeding supplement (PRO-STAT SUGAR FREE 64)  30 mL Oral Q24H  . feeding supplement (RESOURCE BREEZE)  1 Container Oral BID PC  . hydrALAZINE  25 mg Oral 3 times per day  . Influenza vac split quadrivalent PF  0.5 mL Intramuscular Tomorrow-1000  . insulin aspart  0-15 Units Subcutaneous QAC lunch  . insulin aspart  0-5 Units Subcutaneous QHS  . insulin aspart protamine- aspart  14 Units Subcutaneous BID WC  . living well with diabetes book   Does  not apply Once  . metoprolol tartrate  50 mg Oral BID  . neomycin-bacitracin-polymyxin   Topical Daily  . nicotine  7 mg Transdermal Daily  . polyethylene glycol  17 g Oral Daily  . senna-docusate  2 tablet Oral BID   Infusions:  . sodium chloride 10 mL/hr at 01/25/15 0924  . sodium chloride    . sodium chloride irrigation      Labs:  Recent Labs  01/25/15 0505 01/26/15 0748  NA 139 134*  K 4.0 4.1  CL 104 99  CO2 31 26  GLUCOSE 132* 232*  BUN 22 22  CREATININE 2.38* 2.42*  CALCIUM 7.9* 8.5   No results for input(s): AST, ALT, ALKPHOS, BILITOT, PROT, ALBUMIN in the last 72 hours.  Recent Labs  01/25/15 0505 01/26/15 0748  WBC 9.0 10.6*  HGB 7.3* 9.2*  HCT 23.2* 28.2*  MCV 87.5 85.2  PLT 384 430*    Recent Labs  01/26/15 0748  CKTOTAL 166   Invalid input(s): POCBNP No results for input(s): HGBA1C in the last 72 hours.   Radiology/Studies:  Dg Orthopantogram  01/14/2015   CLINICAL DATA:  Staphylococcal endocarditis.  Poor dentition.  EXAM: ORTHOPANTOGRAM/PANORAMIC  COMPARISON:  None.  FINDINGS: Extensive dental disease with caries involving multiple teeth, including numbers 7, 8, 9, 10, 11, 12, 13, 14, 15, 16, 19, 20, 29, 30, 31 and 32. Periapical lucencies are present adjacent to teeth numbers 29 and 30.  IMPRESSION: Extensive dental disease as described.   Electronically  Signed   By: Hulan Saas M.D.   On: 01/14/2015 21:31   Dg Chest 1 View  01/22/2015   CLINICAL DATA:  CHF, increasing right-sided chest pain  EXAM: CHEST - 1 VIEW  COMPARISON:  01/14/2015  FINDINGS: Cardiac shadow is mildly enlarged. Bibasilar atelectatic changes and effusions are seen slightly increased from the prior exam. No other focal abnormality is noted.  IMPRESSION: Slight increase in bibasilar changes.   Electronically Signed   By: Alcide Clever M.D.   On: 01/22/2015 09:57   Dg Chest 2 View  01/14/2015   CLINICAL DATA:  Right shoulder pain for 6 days.  EXAM: CHEST  2 VIEW   COMPARISON:  01/10/2015  FINDINGS: Two views of the chest demonstrate low lung volumes with bibasilar chest densities, left side greater than right. In addition, the heart appears to be enlarged. The configuration of the heart raises the possibility for pericardial fluid. No evidence for pulmonary edema. Difficult to exclude small effusions, particularly on the left side.  IMPRESSION: Low lung volumes with bibasilar densities, left side greater than right. Basilar densities could represent a combination of atelectasis and pleural fluid.  Heart appears enlarged which may be accentuated by low lung volumes. Pericardial fluid cannot be excluded.   Electronically Signed   By: Richarda Overlie M.D.   On: 01/14/2015 08:17   Dg Shoulder Right  01/09/2015   CLINICAL DATA:  Anterior right shoulder pain  EXAM: RIGHT SHOULDER - 2+ VIEW  COMPARISON:  None.  FINDINGS: No fracture or dislocation is seen.  The joint spaces are preserved.  The visualized soft tissues are unremarkable.  Visualized right lung is clear.  IMPRESSION: No fracture or dislocation is seen.   Electronically Signed   By: Charline Bills M.D.   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  EXAM: MRI HEAD WITHOUT CONTRAST  TECHNIQUE: Multiplanar, multiecho pulse sequences of the brain and surrounding structures were obtained without intravenous contrast.  COMPARISON:  Prior study from 04/25/2012  FINDINGS: Cerebral volume within normal limits for patient age. No significant chronic small vessel ischemic changes identified. No mass lesion, midline shift, or mass effect. Ventricles are normal in size without evidence of hydrocephalus. No extra-axial fluid collection.  There are multiple subcentimeter ischemic infarcts involving predominantly the supratentorial brain. These involve both frontal lobes, the right parietal lobe, left temporal lobe, ischemic  infarct within the right is cerebral hemisphere is located in the anterior right frontal lobe and measures 5 mm (series 3, image 36). The largest infarct in the left cerebral hemisphere is located in the deep white matter of the anterior left frontal lobe and measures 13 mm (series 3, image 30). There are infarcts involving the bilateral basal ganglia, the most prominent of which located in the right thalamus which measures 4 mm. There is a single infratentorial infarct within the left cerebellar hemisphere measuring 4 mm. These are most likely embolic in nature given the history of endocarditis and multiple vascular distributions. Scattered susceptibility artifact seen associated with a few of these infarcts, likely reflecting small petechial hemorrhages. No frank hemorrhagic conversion There is associated T2/FLAIR signal intensity without significant mass effect.  Craniocervical junction within normal limits. Pituitary gland normal.  No acute abnormality seen about the orbits.  Paranasal sinuses are clear.  No mastoid effusion.  Bone marrow signal intensity within normal limits. Visualized upper cervical spine unremarkable.  Scalp soft tissues within normal limits.  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 These are likely embolic in nature given the history of endocarditis and involvement of multiple vascular distributions. No significant mass effect. There are likely scattered small petechial hemorrhages with a number these infarcts without frank hemorrhage.   Electronically Signed   By: Rise Mu M.D.   On: 01/14/2015 21:24   Mr Thoracic Spine Wo Contrast  01/15/2015   ADDENDUM REPORT: 01/15/2015 14:40  ADDENDUM: Study discussed by telephone with Dr. Paulette Blanch DAM on 01/15/2015 at 1435 hrs.   Electronically Signed   By: Augusto [name] M.D.   On: 01/15/2015 14:40   01/15/2015   CLINICAL DATA:  35 year old male with sepsis and back  pain. Into card itis, purulent pericarditis, multiple small embolic infarcts. Initial encounter.  EXAM: MRI THORACIC AND LUMBAR SPINE WITHOUT CONTRAST  TECHNIQUE: Multiplanar and multiecho pulse sequences of the thoracic and lumbar spine were obtained without intravenous contrast.  COMPARISON:  Chest radiographs 01/14/2015. CT Abdomen and Pelvis 05/18/2013.  FINDINGS: MR THORACIC SPINE FINDINGS  Limited sagittal imaging of the cervical spine is unremarkable.  Preserved thoracic vertebral height and alignment, preserved thoracic vertebral height and alignment.  Throughout much of the T3 and T4 vertebral bodies there is heterogeneous T2 signal as well as patchy pre STIR signal. The STIR images were repeated, an the same patchy increased signal persisted at T3 and T4.  No definite signal abnormality in the intervening disc, or adjacent disc spaces; there is widespread intervertebral disc T2 hyperintensity, as a normal finding seen in young patients.  No definite paraspinal or epidural inflammation at those levels.  No suspicious marrow signal elsewhere; intrinsic T1 and T2 hyperintensity at T6 is a benign vertebral body hemangioma.  However, there are layering pleural effusions left greater than right. Negative visualized upper abdominal viscera.  Epidural lipomatosis throughout the thoracic spine and effaces CSF from the thecal sac from the T5 to the T9 level. No definite spinal cord signal abnormality.  MR LUMBAR SPINE FINDINGS  In the lumbar spine T1-T2 and stir marrow signal appears diminished. This is also true in the visible pelvis. Possible associated decreased T2 signal in both the liver and spleen.  Otherwise negative visualized abdominal viscera.  There is mildly increased STIR signal in the lower lumbar erector spinae muscles bilaterally, at L4-L5 (series 17, images 9 and 1). Subcutaneous edema incidentally noted.  Normal lumbar vertebral height and alignment and no marrow edema or acute osseous abnormality  in the lumbar spine.  No definite signal abnormality in the lower thoracic spinal cord, conus at L1-L2.  Lumbar facet hypertrophy.  No lumbar spinal stenosis.  IMPRESSION: MR THORACIC SPINE IMPRESSION  1. Abnormal marrow edema in the T3 and T4 vertebral bodies highly suspicious for acute osteomyelitis in this setting. No associated epidural or paraspinal inflammation identified. 2. No other acute findings in the thoracic spine. Epidural lipomatosis. 3. Left greater than right layering pleural effusions.  MR LUMBAR SPINE IMPRESSION  1. Mild increased signal in the L4-L5 level erector spinae muscles bilaterally, which could be infectious myositis but also this appearance can be seen without infection in chronically debilitated patients. 2. No other acute finding in the lumbar spine. 3. Decreased bone marrow signal suggestive of red marrow conversion versus hemosiderosis.  Electronically Signed: By: Augusto [name] M.D. On: 01/15/2015 14:20   Mr Lumbar Spine Wo Contrast  01/15/2015   ADDENDUM REPORT: 01/15/2015 14:40  ADDENDUM: Sarita Bottom  discussed by telephone with Dr. Paulette Blanch DAM on 01/15/2015 at 1435 hrs.   Electronically Signed   By: Augusto [name] M.D.   On: 01/15/2015 14:40   01/15/2015   CLINICAL DATA:  35 year old male with sepsis and back pain. Into card itis, purulent pericarditis, multiple small embolic infarcts. Initial encounter.  EXAM: MRI THORACIC AND LUMBAR SPINE WITHOUT CONTRAST  TECHNIQUE: Multiplanar and multiecho pulse sequences of the thoracic and lumbar spine were obtained without intravenous contrast.  COMPARISON:  Chest radiographs 01/14/2015. CT Abdomen and Pelvis 05/18/2013.  FINDINGS: MR THORACIC SPINE FINDINGS  Limited sagittal imaging of the cervical spine is unremarkable.  Preserved thoracic vertebral height and alignment, preserved thoracic vertebral height and alignment.  Throughout much of the T3 and T4 vertebral bodies there is heterogeneous T2 signal as well as patchy pre STIR signal. The  STIR images were repeated, an the same patchy increased signal persisted at T3 and T4.  No definite signal abnormality in the intervening disc, or adjacent disc spaces; there is widespread intervertebral disc T2 hyperintensity, as a normal finding seen in young patients.  No definite paraspinal or epidural inflammation at those levels.  No suspicious marrow signal elsewhere; intrinsic T1 and T2 hyperintensity at T6 is a benign vertebral body hemangioma.  However, there are layering pleural effusions left greater than right. Negative visualized upper abdominal viscera.  Epidural lipomatosis throughout the thoracic spine and effaces CSF from the thecal sac from the T5 to the T9 level. No definite spinal cord signal abnormality.  MR LUMBAR SPINE FINDINGS  In the lumbar spine T1-T2 and stir marrow signal appears diminished. This is also true in the visible pelvis. Possible associated decreased T2 signal in both the liver and spleen.  Otherwise negative visualized abdominal viscera.  There is mildly increased STIR signal in the lower lumbar erector spinae muscles bilaterally, at L4-L5 (series 17, images 9 and 1). Subcutaneous edema incidentally noted.  Normal lumbar vertebral height and alignment and no marrow edema or acute osseous abnormality in the lumbar spine.  No definite signal abnormality in the lower thoracic spinal cord, conus at L1-L2.  Lumbar facet hypertrophy.  No lumbar spinal stenosis.  IMPRESSION: MR THORACIC SPINE IMPRESSION  1. Abnormal marrow edema in the T3 and T4 vertebral bodies highly suspicious for acute osteomyelitis in this setting. No associated epidural or paraspinal inflammation identified. 2. No other acute findings in the thoracic spine. Epidural lipomatosis. 3. Left greater than right layering pleural effusions.  MR LUMBAR SPINE IMPRESSION  1. Mild increased signal in the L4-L5 level erector spinae muscles bilaterally, which could be infectious myositis but also this appearance can be seen  without infection in chronically debilitated patients. 2. No other acute finding in the lumbar spine. 3. Decreased bone marrow signal suggestive of red marrow conversion versus hemosiderosis.  Electronically Signed: By: Augusto [name] M.D. On: 01/15/2015 14:20   US Renal  01/12/2015   CLINICAL DATA:  Acute renal failure  EXAM: RENAL/URINARY TRACT ULTRASOUND COMPLETE  COMPARISON:  None.  FINDINGS: Right Kidney:  Length: 12.8 cm in length. Diffuse increased cortical echogenicity suspicious for medical renal disease. No hydronephrosis. No renal calculus.  Left Kidney:  Length: . 12.6 cm in length. Diffuse increased echogenicity suspicious for medical renal disease. No hydronephrosis. No diagnostic renal calculus.  Bladder:  Appears normal for degree of bladder distention.  IMPRESSION: Bilateral kidneys shows diffuse increased echogenicity. Medical renal disease cannot be excluded. No hydronephrosis or diagnostic renal calculus.   Electronically Signed  By: Natasha Mead M.D.   On: 01/12/2015 16:08   Mr Shoulder Right Wo Contrast  01/11/2015   CLINICAL DATA:  Right shoulder pain.  EXAM: MRI OF THE RIGHT SHOULDER WITHOUT CONTRAST  TECHNIQUE: Multiplanar, multisequence MR imaging of the shoulder was performed. No intravenous contrast was administered.  COMPARISON:  Radiographs dated 01/09/2015  FINDINGS: Rotator cuff:  Normal.  Muscles:  Normal.  Biceps long head: Properly located and intact. There is inflammation in the soft tissues around the bicipital tendon sheath with fluid in the bicipital tendon sheath.  Acromioclavicular Joint: Small degenerative cyst in the acromion. Otherwise normal AC joint. Type 1 acromion. No bursitis.  Glenohumeral Joint: There is a moderate glenohumeral joint effusion with extravasation of contrast from the subcoracoid recess of the joint into the adjacent soft tissues. Fluid distends the bicipital tendon sheath.  Labrum:  Intact.  Bones: No significant abnormalities. Tiny degenerative  cystic areas in the posterior aspect of the greater tuberosity of the proximal humerus.  IMPRESSION: 1. Moderate glenohumeral joint effusion with some extravasation of joint. Fluid distends the bicipital tendon sheath with inflammation/extravasated joint fluid around the bicipital tendon sheath. The possibly of synovitis should be considered. 2. Normal rotator cuff.   Electronically Signed   By: Geanie Cooley M.D.   On: 01/11/2015 11:21   Mr Tibia Fibula Right Wo Contrast  01/15/2015   CLINICAL DATA:  Diabetic patient with bacteremia and right leg pain.  EXAM: MRI OF LOWER RIGHT EXTREMITY WITHOUT CONTRAST  TECHNIQUE: Multiplanar, multisequence MR imaging of the right lower leg was performed. No intravenous contrast was administered.  COMPARISON:  None.  FINDINGS: The axial and coronal sequences incidentally include the left lower leg. There is marked subcutaneous edema of both lower legs which appears slightly worse on the left. No focal subcutaneous fluid collection is identified. Mildly increased T2 signal is seen in the gastrocnemius, soleus and and tibialis posterior muscles bilaterally which appears symmetric. No intramuscular fluid collection is identified. No bone marrow signal abnormality to suggest osteomyelitis is seen. There is no muscle or tendon tear.  IMPRESSION: Intense bilateral lower leg subcutaneous edema appears worse on the left and could be due to dependent change or cellulitis.  Mildly increased T2 signal in the gastrocnemius, soleus and tibialis posterior muscles bilaterally could be due to myositis or early changes of denervation atrophy in this diabetic patient.  Negative for abscess or osteomyelitis.   Electronically Signed   By: Drusilla Kanner M.D.   On: 01/15/2015 08:28   Mr Foot Right Wo Contrast  01/11/2015   CLINICAL DATA:  Diabetic foot ulcers.  EXAM: MRI OF THE RIGHT FOREFOOT WITHOUT CONTRAST  TECHNIQUE: Multiplanar, multisequence MR imaging was performed. No intravenous  contrast was administered.  COMPARISON:  Radiographs dated 01/09/2011  FINDINGS: There are small erosions of the proximal phalanx at the IP joint of the great toe and there are small erosions at the first metatarsal phalangeal joint, both consistent with gout. There are no soft tissue abscesses. There is no osteomyelitis. There is nonspecific edema in the subcutaneous fat of the dorsum of the foot. Muscles and tendons and ligaments appear appear normal. No joint effusions.  IMPRESSION: Subcutaneous edema primarily on the dorsum of the foot. 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   Electronically Signed   By: Geanie Cooley M.D.   On: 01/11/2015 12:50   Mr Femur Left Wo Contrast  01/12/2015   CLINICAL DATA:  Diabetes. Peripheral neuropathy. Sepsis. Bilateral thigh pain. RIGHT  thigh pain. Tenderness in the medial aspect of the thigh.  EXAM: MR OF THE LEFT FEMUR WITHOUT CONTRAST  TECHNIQUE: Multiplanar, multisequence MR imaging was performed. No intravenous contrast was administered.  COMPARISON:  None.  FINDINGS: Diffuse subcutaneous edema is present around the pelvis and in both thighs, most compatible with anasarca. This could also represent cellulitis in the appropriate clinical setting. Subcutaneous fluid is present in the lateral LEFT thigh, without a discrete abscess. There is myositis of the LEFT vastus lateralis with mild muscular edema av but no significant swelling. No fatty atrophy.  Bone marrow signal shows heterogenous marrow. This is a nonspecific finding most commonly associated with obesity, anemia, cigarette smoking or chronic disease. Negative for hip fracture or AVN. There is no evidence of osteomyelitis. No deep soft tissue abscesses are present.  Bilaterally in the groin, there are prominent lymph nodes which are probably reactive to lower extremity process.  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  IMPRESSION: 1. Diffuse  subcutaneous edema in the pelvis and thighs bilaterally, suggesting anasarca. 2. More prominent subcutaneous edema along the LEFT thigh with subcutaneous fluid superficial to the muscular fascia, likely representing cellulitis. No abscess. 3. Myositis of the proximal vastus lateralis, probably infectious. Reactive edema could produce this appearance as well. 4. Reactive inguinal adenopathy bilaterally. 5. Negative for osteomyelitis.   Electronically Signed   By: Andreas Newport M.D.   On: 01/12/2015 14:33   Mr Foot Left Wo Contrast  01/11/2015   CLINICAL DATA:  Diabetic foot ulcers.  Foot pain.  Bony erosions.  EXAM: MRI OF THE LEFT FOREFOOT WITHOUT CONTRAST  TECHNIQUE: Multiplanar, multisequence MR imaging was performed. No intravenous contrast was administered.  COMPARISON:  Radiograph dated 01/09/2015  FINDINGS: There is slight edema throughout the medial cuneiform. There is artifactual increased signal from the metatarsal heads in from the proximal phalanx of the great toe. There are erosions at IP joint and first metatarsal phalangeal joint consistent with gout.  There are no soft tissue abscesses and there is no osteomyelitis. There is prominent edema in the subcutaneous fat of the dorsum of the foot.  IMPRESSION: No evidence of osteomyelitis or abscess. The edema in the medial cuneiform is most likely a stress reaction. 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   Electronically Signed   By: Geanie Cooley M.D.   On: 01/11/2015 12:15   Dg Chest Port 1 View  01/26/2015   CLINICAL DATA:  Shortness of breath.  Feels choked when lying flat.  EXAM: PORTABLE CHEST - 1 VIEW  COMPARISON:  01/22/2015  FINDINGS: Cardiac enlargement without vascular congestion. Shallow inspiration with mild atelectasis in the lung bases. Small bilateral pleural effusion. No pneumothorax. No change since prior study.  IMPRESSION: Cardiac enlargement. Atelectasis in the lung bases. Small bilateral  pleural effusion.   Electronically Signed   By: Burman Nieves M.D.   On: 01/26/2015 05:55   Dg Chest Port 1 View  01/10/2015   CLINICAL DATA:  Fever and sweats.  EXAM: PORTABLE CHEST - 1 VIEW  COMPARISON:  None.  FINDINGS: Low lung volumes with vascular crowding and streaky subsegmental atelectasis. No obvious infiltrate or effusion. The heart is within normal limits in size given the AP portable technique. The bony structures are grossly normal.  IMPRESSION: Low lung volumes with vascular crowding and bibasilar atelectasis.   Electronically Signed   By: Luan Pulling.D.  On: 01/10/2015 11:27   Dg Foot Complete Left  01/09/2015   CLINICAL DATA:  35 year old male with recurrent bilateral foot pain.  EXAM: LEFT FOOT - COMPLETE 3+ VIEW  COMPARISON:  01/05/2014.  FINDINGS: Large periarticular erosions are noted adjacent to the first MTP joint, and at the first interphalangeal joint along the medial margin in both locations. These have erosions have sclerotic margins and overhanging edges, and there is adjacent soft tissue prominence, suggestive of gouty erosions. No adjacent soft tissue calcification is noted. No acute displaced fracture, subluxation or dislocation. Small heterotopic ossification adjacent to the plantar aspect of the calcaneus, likely ossification within the plantar fascia.  IMPRESSION: 1. Findings, as above, suggestive of underlying gout. Clinical correlation is recommended.   Electronically Signed   By: Trudie Reed M.D.   On: 01/09/2015 09:46   Dg Foot Complete Right  01/09/2015   CLINICAL DATA:  Recurring bilateral foot pain  EXAM: RIGHT FOOT COMPLETE - 3+ VIEW  COMPARISON:  None.  FINDINGS: No fracture or dislocation is seen.  Marginal erosion at the medial aspect of the 1st IP joint, involving the proximal phalanx.  The visualized soft tissues are unremarkable.  IMPRESSION: Marginal erosion at the medial aspect of the 1st IP joint. Correlate for inflammatory arthropathy such  as gout.  Otherwise, no acute osseus abnormality is seen.   Electronically Signed   By: Charline Bills M.D.   On: 01/09/2015 09:44   Dg Fluoro Guide Ndl Plc/bx  01/14/2015   CLINICAL DATA:  Right shoulder pain and effusion.  Bacteremia.  EXAM: RIGHT SHOULDER ASPIRATION UNDER FLUOROSCOPY  FLUOROSCOPY TIME:  0 min 24 seconds  PROCEDURE: Overlying skin prepped with Betadine, draped in the usual sterile fashion, and infiltrated locally with buffered Lidocaine. Twenty gauge spinal needle was placed in the right glenohumeral joint under direct fluoroscopic visualization.  2 cc of bloody cloudy fluid was aspirated from the joint. Mr. Nohr tolerated the procedure well.  IMPRESSION: Technically successful right shoulder aspiration under fluoroscopy. Fluid sent for laboratory analysis.   Electronically Signed   By: Geanie Cooley M.D.   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 Other valves appear normal. Normal LV function. Moderate to severe LVH. - most recent TCTS input: favoring a 6 week course of antibiotics before proceding with mitral valve repair/replacement - however, due to persistent leukocytosis and pain in numerous places (presumably related to septic arthritis and myositis), he is probably not  currently a candidate for outpatient therapy; mreover, the fact that the organism involved is Staphylococcus aureus he still has the potential to deteriorate clinically without much warning - LHC 01/24/15: normal cors, mod pulm HTN, elevated LV filling pressures with large V wave c/w severe MR - s/p dental extraction 01/25/15 - ID also following - on ampicillin - they are worried for large burden of disease with slow  downtrend of WBC - await TCTS input regarding further plans for surgery  2. Elevated troponin c/w demand ischemia - cath with normal cors  3. Acute diastolic CHF with hypertensive heart disease complicated by valvular heart disease - TEE 01/13/15 as above  - some of his edema may be r/t severe hypoalbuminemia (1.3)  - will increase BB for HR/BP control - suspect ? in volume from blood - per d/w MD, diurese with 80mg  IV Lasix BID x 2 doses, reassess in AM  4. Acute renal insufficiency - ? CKD - Cr was 1.37 in 11/2013; peak Cr 3.15  this admission: possibly embolic in etiology, then plateaued around 2 - renal US 1/13: increased echogenicity, cannot exclude medical renal disease; no other acute findings - slight increase 2.1-> 2.38->2.42 post-cath but appears volume overloaded - follow with diuresis  5. Pericarditis with small fibrinous pericardial effusion by TEE 01/13/15 - Dr. Anne Fu recommended consideration by TCTS for pericardial stripping as he felt this may be challenging to tx with abx alone - follow clinically  6. Worsening anemia with thrombocytosis, per IM  - Hgb 11.7 gradually ? 7.3 yesterday - ?r/t acute illness - low iron but high ferritin, low TIBC - s/p 2 u PRBCs 01/25/15  7. 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 Edilia Bo did not feel there was any utility in a more aggressive workup of his decreased ABI on the  left - 01/15/15: LLE venous duplex neg for DVT  8. 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 Hyponatremia 11. Hypoalbuminemia 12. Hypokalemia  Signed, Dayna Dunn PA-C  Agree with findings by Ronie Spies PA-C  Appears volume overloaded after Tx 2U PRBCs. Crackles at bases and 2+ pitting edema. Will start IV diuretics and follow I/O, volume status and SCr. S/P dental extraction yesterday.   Runell Gess, M.D., FACP, [facility], [facility] [address] [place], [place]  [address]  [phone] 01/26/2015 10:44 AM

## 2015-01-26 NOTE — Progress Notes (Signed)
[facility]                                                                              Patient Demographics  Dwayne [name], is a 35 y.o. male, DOB - 04/27/1980, ZOX:[id number]  Admit date - 01/09/2015   Admitting Physician Ron ParkerHarvette C Jenkins, MD  Outpatient Primary MD for the patient is No PCP Per Patient  LOS - 17   Chief Complaint  Patient presents with  . Headache  . Nausea  . Diarrhea      HPI on 01/09/2015 by Dr. Della GooHarvette Jenkins Dwayne HickmanBenjamin [name] is a 35 y.o. male with a history of Uncontrolled HTN, DM2 who presents to the ED with complaints of diarrhea and fevers and chills x 24 hours. He reports having weakness and fatigue and He denies any nausea or vomiting. No one is sick at home, and he denies taking any recent antibiotic therapy. He reports that he has been out of his medications for months, and has not seen his PCP in months as well. 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  Interim history He was found to have MSSA bacteremia and came in septic shock. He underwent TEE showing MV endocarditis. CTVs consulted and plan for surgery in the next week.He underwent cardiac cath on 1/25 showing normal coronary angiography. Dental medicine consulted and planned for extraction of infected teeth today on 1/26. He will be scheduled for mitral valve surgery as per cardio thoracic surgery.   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 with perforation and severe mitral insufficiency -Cardiothoracic surgery recommended six-week course of antibiotics before proceeding with mitral valve repair or replacement however due to septic arthritis and myositis, with deterioration, patient may warrant intervention sooner rather than  later -Left heart cath on 01/24/2015 showed normal coronaries, moderate pulmonary hypertension with elevated LV filling pressures with large V wave and severe MR -Patient underwent dental extraction of 20 teeth on 01/25/2015 -Cardiology/Infectious disease consulted and appreciated -Continue ampicillin -Pending further recommendations from cardiothoracic surgery  Elevated troponin -Likely secondary to demand ischemia -Heart catheterization showed normal coronary arteries  Acute diastolic heart failure -Complicated by valvular disease and transfusion of 2 units packed red blood cells -Cardiology consultation appreciated -Patient started on IV Lasix, will continue to monitor intake and output as well as daily weights -Patient refuses to keep his legs elevated  Acute renal insufficiency/ ? 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, no need for aggressive workup  Normocytic Anemia  -Patient was given 2 units packed red blood cells 01/25/2015 -Anemia panel showed low  iron but high ferritin (possibly acute phase reactant)   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, my ascites of L4-L5 -Treatment plan as above -Patient had fluoroguided aspiration, cultures show no growth -Patient seen by orthopedic surgery, no surgical intervention for now  Diabetes mellitus -Poorly controlled -Hemoglobin A1c 8.1 -Patient admits to being noncompliant medications -Diabetes coordinator consulted and  appreciated -Continue insulin sliding scale CBG monitoring and 70/30  Diarrhea -Cdiff PCR negative  Hypertension -Continue lopressor and hydralazine PRN  Code Status: Full  Family Communication: Girlfriend at bedside  Disposition Plan: Admitted  Time Spent in minutes   30 minutes  Procedures  Echocardiogram (TTE) TEE ABI Lower ext doppler Right heart Cath, LV angiography Dental extraction Fluoroguided aspiration of shoulder  Consults   Cardiology Infectious Disease Dentistry Nephrology Orthopedic surgery Cardiothoracic surgery Vascular surgery  DVT Prophylaxis  SCDs  Lab Results  Component Value Date   PLT 430* 01/26/2015    Medications  Scheduled Meds: . ampicillin (OMNIPEN) IV  2 g Intravenous 6 times per day  . chlorhexidine  15 mL Mouth/Throat BID  . feeding supplement (PRO-STAT SUGAR FREE 64)  30 mL Oral Q24H  . feeding supplement (RESOURCE BREEZE)  1 Container Oral BID PC  . furosemide  80 mg Intravenous BID  . hydrALAZINE  25 mg Oral 3 times per day  . Influenza vac split quadrivalent PF  0.5 mL Intramuscular Tomorrow-1000  . insulin aspart  0-15 Units Subcutaneous QAC lunch  . insulin aspart  0-5 Units Subcutaneous QHS  . insulin aspart protamine- aspart  14 Units Subcutaneous BID WC  . living well with diabetes book   Does not apply Once  . metoprolol tartrate  75 mg Oral BID  . neomycin-bacitracin-polymyxin   Topical Daily  . nicotine  7 mg Transdermal Daily  . polyethylene glycol  17 g Oral Daily  . senna-docusate  2 tablet Oral BID   Continuous Infusions: . sodium chloride 10 mL/hr at 01/25/15 0924  . sodium chloride    . sodium chloride irrigation     PRN Meds:.acetaminophen **OR** acetaminophen, alum & mag hydroxide-simeth, hydrALAZINE, HYDROmorphone (DILAUDID) injection, morphine injection, ondansetron **OR** ondansetron (ZOFRAN) IV, oxyCODONE  Antibiotics    Anti-infectives    Start     Dose/Rate Route Frequency Ordered Stop    01/19/15 2000  ampicillin (OMNIPEN) 2 g in sodium chloride 0.9 % 50 mL IVPB     2 g150 mL/hr over 20 Minutes Intravenous 6 times per day 01/19/15 1121     01/18/15 2000  penicillin G potassium 12 Million Units in dextrose 5 % 500 mL continuous infusion     12 Million Units41.7 mL/hr over 12 Hours Intravenous Every 12 hours 01/18/15 1432 01/19/15 2050   01/15/15 1600  nafcillin 2 g in dextrose 5 % 50 mL IVPB  Status:  Discontinued     2 g100 mL/hr over 30 Minutes Intravenous 6 times per day 01/15/15 1503 01/15/15 1517   01/15/15 1600  nafcillin 2 g in dextrose 5 % 50 mL IVPB     2 g100 mL/hr over 30 Minutes Intravenous 6 times per day 01/15/15 1517 01/18/15 1753   01/15/15 0930  nafcillin injection 2 g  Status:  Discontinued     2 g Intravenous Every 4 hours 01/15/15 0921 01/15/15 1503   01/14/15 1500  DAPTOmycin (CUBICIN) 600 mg in sodium chloride 0.9 % IVPB  Status:  Discontinued     600 mg224 mL/hr over 30 Minutes Intravenous Every 48 hours 01/12/15  1421 01/13/15 1239   01/13/15 1500  ceFAZolin (ANCEF) IVPB 2 g/50 mL premix  Status:  Discontinued     2 g100 mL/hr over 30 Minutes Intravenous 3 times per day 01/13/15 1407 01/15/15 0921   01/12/15 1600  ceFAZolin (ANCEF) IVPB 2 g/50 mL premix  Status:  Discontinued     2 g100 mL/hr over 30 Minutes Intravenous Every 12 hours 01/12/15 1419 01/13/15 1407   01/11/15 1200  DAPTOmycin (CUBICIN) 600 mg in sodium chloride 0.9 % IVPB  Status:  Discontinued     600 mg224 mL/hr over 30 Minutes Intravenous Every 24 hours 01/11/15 1003 01/12/15 1421   01/10/15 1200  vancomycin (VANCOCIN) IVPB 1000 mg/200 mL premix  Status:  Discontinued     1,000 mg200 mL/hr over 60 Minutes Intravenous Every 8 hours 01/10/15 1044 01/11/15 0947   01/10/15 1100  piperacillin-tazobactam (ZOSYN) IVPB 3.375 g  Status:  Discontinued     3.375 g12.5 mL/hr over 240 Minutes Intravenous Every 8 hours 01/10/15 1043 01/11/15 0902   01/09/15 0700  metroNIDAZOLE (FLAGYL) IVPB 500 mg   Status:  Discontinued     500 mg100 mL/hr over 60 Minutes Intravenous Every 8 hours 01/09/15 0651 01/10/15 1018      Subjective:   Dwayne [name] seen and examined today.  Patient is not very verbal at this time.  He is up and communicate with shaking and nodding of his head as well as written communication.  Patient refuses to elevate his legs.  Denies chest pain or shortness of breath.  Objective:   Filed Vitals:   01/25/15 2220 01/26/15 0251 01/26/15 0330 01/26/15 0453  BP: 165/93 174/96 175/98 164/86  Pulse: 93 85 88 90  Temp: 98.5 F (36.9 C) 98 F (36.7 C) 98.4 F (36.9 C)   TempSrc: Oral Oral Oral   Resp:  18 22 28   Height:      Weight:   112.7 kg (248 lb 7.3 oz)   SpO2: 100% 91% 95% 95%    Wt Readings from Last 3 Encounters:  01/26/15 112.7 kg (248 lb 7.3 oz)  01/28/14 100.699 kg (222 lb)  01/16/14 100.744 kg (222 lb 1.6 oz)     Intake/Output Summary (Last 24 hours) at 01/26/15 1250 Last data filed at 01/26/15 1200  Gross per 24 hour  Intake   1440 ml  Output   1300 ml  Net    140 ml    Exam  General: Well developed, well nourished, NAD, appears stated age  HEENT: NCAT, mucous membranes moist.   Cardiovascular: S1 S2 auscultated, 2/6 SEM, Regular rate and rhythm.  Respiratory: Decreased breath sounds particularly at the bases, crackles, no wheezing noted  Abdomen: Soft, nontender, nondistended, + bowel sounds  Extremities: warm dry without cyanosis clubbing. +2 pitting edema in LE B/L  Neuro: AAOx3, nonfocal  Psych: Flat affect  Data Review   Micro Results Recent Results (from the past 240 hour(s))  Culture, blood (routine x 2)     Status: None   Collection Time: 01/18/15  6:10 PM  Result Value Ref Range Status   Specimen Description BLOOD LEFT ARM  Final   Special Requests BOTTLES DRAWN AEROBIC AND ANAEROBIC 10CC  Final   Culture   Final    NO GROWTH 5 DAYS Performed at [facility]    Report Status 01/25/2015 FINAL  Final    Culture, blood (routine x 2)     Status: None   Collection Time: 01/18/15  6:18 PM  Result Value Ref Range Status   Specimen Description BLOOD RIGHT ARM  Final   Special Requests BOTTLES DRAWN AEROBIC AND ANAEROBIC 10CC  Final   Culture   Final    NO GROWTH 5 DAYS Performed at [facility]    Report Status 01/25/2015 FINAL  Final  Surgical pcr screen     Status: None   Collection Time: 01/25/15  4:47 AM  Result Value Ref Range Status   MRSA, PCR NEGATIVE NEGATIVE Final   Staphylococcus aureus NEGATIVE NEGATIVE Final    Comment:        The Xpert SA Assay (FDA approved for NASAL specimens in patients over 23 years of age), is one component of a comprehensive surveillance program.  Test performance has been validated by [facility] for patients greater than or equal to 35 year old. It is not intended to diagnose infection nor to guide or monitor treatment.     Radiology Reports Dg Orthopantogram  01/14/2015   CLINICAL DATA:  Staphylococcal endocarditis.  Poor dentition.  EXAM: ORTHOPANTOGRAM/PANORAMIC  COMPARISON:  None.  FINDINGS: Extensive dental disease with caries involving multiple teeth, including numbers 7, 8, 9, 10, 11, 12, 13, 14, 15, 16, 19, 20, 29, 30, 31 and 32. Periapical lucencies are present adjacent to teeth numbers 29 and 30.  IMPRESSION: Extensive dental disease as described.   Electronically Signed   By: Hulan Saas M.D.   On: 01/14/2015 21:31   Dg Chest 1 View  01/22/2015   CLINICAL DATA:  CHF, increasing right-sided chest pain  EXAM: CHEST - 1 VIEW  COMPARISON:  01/14/2015  FINDINGS: Cardiac shadow is mildly enlarged. Bibasilar atelectatic changes and effusions are seen slightly increased from the prior exam. No other focal abnormality is noted.  IMPRESSION: Slight increase in bibasilar changes.   Electronically Signed   By: Alcide Clever M.D.   On: 01/22/2015 09:57   Dg Chest 2 View  01/14/2015   CLINICAL DATA:  Right shoulder pain for 6 days.   EXAM: CHEST  2 VIEW  COMPARISON:  01/10/2015  FINDINGS: Two views of the chest demonstrate low lung volumes with bibasilar chest densities, left side greater than right. In addition, the heart appears to be enlarged. The configuration of the heart raises the possibility for pericardial fluid. No evidence for pulmonary edema. Difficult to exclude small effusions, particularly on the left side.  IMPRESSION: Low lung volumes with bibasilar densities, left side greater than right. Basilar densities could represent a combination of atelectasis and pleural fluid.  Heart appears enlarged which may be accentuated by low lung volumes. Pericardial fluid cannot be excluded.   Electronically Signed   By: Richarda Overlie M.D.   On: 01/14/2015 08:17   Dg Shoulder Right  01/09/2015   CLINICAL DATA:  Anterior right shoulder pain  EXAM: RIGHT SHOULDER - 2+ VIEW  COMPARISON:  None.  FINDINGS: No fracture or dislocation is seen.  The joint spaces are preserved.  The visualized soft tissues are unremarkable.  Visualized right lung is clear.  IMPRESSION: No fracture or dislocation is seen.   Electronically Signed   By: Charline Bills M.D.   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  EXAM: MRI HEAD WITHOUT CONTRAST  TECHNIQUE: Multiplanar, multiecho pulse sequences of the brain and surrounding structures were obtained without intravenous contrast.  COMPARISON:  Prior study from 04/25/2012  FINDINGS: Cerebral  volume within normal limits for patient age. No significant chronic small vessel ischemic changes identified. No mass lesion, midline shift, or mass effect. Ventricles are normal in size without evidence of hydrocephalus. No extra-axial fluid collection.  There are multiple subcentimeter ischemic infarcts involving predominantly the supratentorial brain. These involve both frontal lobes, the right parietal lobe, left  temporal lobe, ischemic infarct within the right is cerebral hemisphere is located in the anterior right frontal lobe and measures 5 mm (series 3, image 36). The largest infarct in the left cerebral hemisphere is located in the deep white matter of the anterior left frontal lobe and measures 13 mm (series 3, image 30). There are infarcts involving the bilateral basal ganglia, the most prominent of which located in the right thalamus which measures 4 mm. There is a single infratentorial infarct within the left cerebellar hemisphere measuring 4 mm. These are most likely embolic in nature given the history of endocarditis and multiple vascular distributions. Scattered susceptibility artifact seen associated with a few of these infarcts, likely reflecting small petechial hemorrhages. No frank hemorrhagic conversion There is associated T2/FLAIR signal intensity without significant mass effect.  Craniocervical junction within normal limits. Pituitary gland normal.  No acute abnormality seen about the orbits.  Paranasal sinuses are clear.  No mastoid effusion.  Bone marrow signal intensity within normal limits. Visualized upper cervical spine unremarkable.  Scalp soft tissues within normal limits.  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 These are likely embolic in nature given the history of endocarditis and involvement of multiple vascular distributions. No significant mass effect. There are likely scattered small petechial hemorrhages with a number these infarcts without frank hemorrhage.   Electronically Signed   By: Rise Mu M.D.   On: 01/14/2015 21:24   Mr Thoracic Spine Wo Contrast  01/15/2015   ADDENDUM REPORT: 01/15/2015 14:40  ADDENDUM: Study discussed by telephone with Dr. Paulette Blanch DAM on 01/15/2015 at 1435 hrs.   Electronically Signed   By: Augusto [name] M.D.   On: 01/15/2015 14:40   01/15/2015   CLINICAL DATA:  35 year old male  with sepsis and back pain. Into card itis, purulent pericarditis, multiple small embolic infarcts. Initial encounter.  EXAM: MRI THORACIC AND LUMBAR SPINE WITHOUT CONTRAST  TECHNIQUE: Multiplanar and multiecho pulse sequences of the thoracic and lumbar spine were obtained without intravenous contrast.  COMPARISON:  Chest radiographs 01/14/2015. CT Abdomen and Pelvis 05/18/2013.  FINDINGS: MR THORACIC SPINE FINDINGS  Limited sagittal imaging of the cervical spine is unremarkable.  Preserved thoracic vertebral height and alignment, preserved thoracic vertebral height and alignment.  Throughout much of the T3 and T4 vertebral bodies there is heterogeneous T2 signal as well as patchy pre STIR signal. The STIR images were repeated, an the same patchy increased signal persisted at T3 and T4.  No definite signal abnormality in the intervening disc, or adjacent disc spaces; there is widespread intervertebral disc T2 hyperintensity, as a normal finding seen in young patients.  No definite paraspinal or epidural inflammation at those levels.  No suspicious marrow signal elsewhere; intrinsic T1 and T2 hyperintensity at T6 is a benign vertebral body hemangioma.  However, there are layering pleural effusions left greater than right. Negative visualized upper abdominal viscera.  Epidural lipomatosis throughout the thoracic spine and effaces CSF from the thecal sac from the T5 to the T9 level. No definite spinal cord signal abnormality.  MR LUMBAR SPINE FINDINGS  In the lumbar spine T1-T2 and stir marrow  signal appears diminished. This is also true in the visible pelvis. Possible associated decreased T2 signal in both the liver and spleen.  Otherwise negative visualized abdominal viscera.  There is mildly increased STIR signal in the lower lumbar erector spinae muscles bilaterally, at L4-L5 (series 17, images 9 and 1). Subcutaneous edema incidentally noted.  Normal lumbar vertebral height and alignment and no marrow edema or acute  osseous abnormality in the lumbar spine.  No definite signal abnormality in the lower thoracic spinal cord, conus at L1-L2.  Lumbar facet hypertrophy.  No lumbar spinal stenosis.  IMPRESSION: MR THORACIC SPINE IMPRESSION  1. Abnormal marrow edema in the T3 and T4 vertebral bodies highly suspicious for acute osteomyelitis in this setting. No associated epidural or paraspinal inflammation identified. 2. No other acute findings in the thoracic spine. Epidural lipomatosis. 3. Left greater than right layering pleural effusions.  MR LUMBAR SPINE IMPRESSION  1. Mild increased signal in the L4-L5 level erector spinae muscles bilaterally, which could be infectious myositis but also this appearance can be seen without infection in chronically debilitated patients. 2. No other acute finding in the lumbar spine. 3. Decreased bone marrow signal suggestive of red marrow conversion versus hemosiderosis.  Electronically Signed: By: Augusto [name] M.D. On: 01/15/2015 14:20   Mr Lumbar Spine Wo Contrast  01/15/2015   ADDENDUM REPORT: 01/15/2015 14:40  ADDENDUM: Study discussed by telephone with Dr. Paulette Blanch DAM on 01/15/2015 at 1435 hrs.   Electronically Signed   By: Augusto [name] M.D.   On: 01/15/2015 14:40   01/15/2015   CLINICAL DATA:  35 year old male with sepsis and back pain. Into card itis, purulent pericarditis, multiple small embolic infarcts. Initial encounter.  EXAM: MRI THORACIC AND LUMBAR SPINE WITHOUT CONTRAST  TECHNIQUE: Multiplanar and multiecho pulse sequences of the thoracic and lumbar spine were obtained without intravenous contrast.  COMPARISON:  Chest radiographs 01/14/2015. CT Abdomen and Pelvis 05/18/2013.  FINDINGS: MR THORACIC SPINE FINDINGS  Limited sagittal imaging of the cervical spine is unremarkable.  Preserved thoracic vertebral height and alignment, preserved thoracic vertebral height and alignment.  Throughout much of the T3 and T4 vertebral bodies there is heterogeneous T2 signal as well as patchy pre  STIR signal. The STIR images were repeated, an the same patchy increased signal persisted at T3 and T4.  No definite signal abnormality in the intervening disc, or adjacent disc spaces; there is widespread intervertebral disc T2 hyperintensity, as a normal finding seen in young patients.  No definite paraspinal or epidural inflammation at those levels.  No suspicious marrow signal elsewhere; intrinsic T1 and T2 hyperintensity at T6 is a benign vertebral body hemangioma.  However, there are layering pleural effusions left greater than right. Negative visualized upper abdominal viscera.  Epidural lipomatosis throughout the thoracic spine and effaces CSF from the thecal sac from the T5 to the T9 level. No definite spinal cord signal abnormality.  MR LUMBAR SPINE FINDINGS  In the lumbar spine T1-T2 and stir marrow signal appears diminished. This is also true in the visible pelvis. Possible associated decreased T2 signal in both the liver and spleen.  Otherwise negative visualized abdominal viscera.  There is mildly increased STIR signal in the lower lumbar erector spinae muscles bilaterally, at L4-L5 (series 17, images 9 and 1). Subcutaneous edema incidentally noted.  Normal lumbar vertebral height and alignment and no marrow edema or acute osseous abnormality in the lumbar spine.  No definite signal abnormality in the lower thoracic spinal cord, conus at L1-L2.  Lumbar facet hypertrophy.  No lumbar spinal stenosis.  IMPRESSION: MR THORACIC SPINE IMPRESSION  1. Abnormal marrow edema in the T3 and T4 vertebral bodies highly suspicious for acute osteomyelitis in this setting. No associated epidural or paraspinal inflammation identified. 2. No other acute findings in the thoracic spine. Epidural lipomatosis. 3. Left greater than right layering pleural effusions.  MR LUMBAR SPINE IMPRESSION  1. Mild increased signal in the L4-L5 level erector spinae muscles bilaterally, which could be infectious myositis but also this  appearance can be seen without infection in chronically debilitated patients. 2. No other acute finding in the lumbar spine. 3. Decreased bone marrow signal suggestive of red marrow conversion versus hemosiderosis.  Electronically Signed: By: Augusto [name] M.D. On: 01/15/2015 14:20   US Renal  01/12/2015   CLINICAL DATA:  Acute renal failure  EXAM: RENAL/URINARY TRACT ULTRASOUND COMPLETE  COMPARISON:  None.  FINDINGS: Right Kidney:  Length: 12.8 cm in length. Diffuse increased cortical echogenicity suspicious for medical renal disease. No hydronephrosis. No renal calculus.  Left Kidney:  Length: . 12.6 cm in length. Diffuse increased echogenicity suspicious for medical renal disease. No hydronephrosis. No diagnostic renal calculus.  Bladder:  Appears normal for degree of bladder distention.  IMPRESSION: Bilateral kidneys shows diffuse increased echogenicity. Medical renal disease cannot be excluded. No hydronephrosis or diagnostic renal calculus.   Electronically Signed   By: Natasha Mead M.D.   On: 01/12/2015 16:08   Mr Shoulder Right Wo Contrast  01/11/2015   CLINICAL DATA:  Right shoulder pain.  EXAM: MRI OF THE RIGHT SHOULDER WITHOUT CONTRAST  TECHNIQUE: Multiplanar, multisequence MR imaging of the shoulder was performed. No intravenous contrast was administered.  COMPARISON:  Radiographs dated 01/09/2015  FINDINGS: Rotator cuff:  Normal.  Muscles:  Normal.  Biceps long head: Properly located and intact. There is inflammation in the soft tissues around the bicipital tendon sheath with fluid in the bicipital tendon sheath.  Acromioclavicular Joint: Small degenerative cyst in the acromion. Otherwise normal AC joint. Type 1 acromion. No bursitis.  Glenohumeral Joint: There is a moderate glenohumeral joint effusion with extravasation of contrast from the subcoracoid recess of the joint into the adjacent soft tissues. Fluid distends the bicipital tendon sheath.  Labrum:  Intact.  Bones: No significant abnormalities.  Tiny degenerative cystic areas in the posterior aspect of the greater tuberosity of the proximal humerus.  IMPRESSION: 1. Moderate glenohumeral joint effusion with some extravasation of joint. Fluid distends the bicipital tendon sheath with inflammation/extravasated joint fluid around the bicipital tendon sheath. The possibly of synovitis should be considered. 2. Normal rotator cuff.   Electronically Signed   By: Geanie Cooley M.D.   On: 01/11/2015 11:21   Mr Tibia Fibula Right Wo Contrast  01/15/2015   CLINICAL DATA:  Diabetic patient with bacteremia and right leg pain.  EXAM: MRI OF LOWER RIGHT EXTREMITY WITHOUT CONTRAST  TECHNIQUE: Multiplanar, multisequence MR imaging of the right lower leg was performed. No intravenous contrast was administered.  COMPARISON:  None.  FINDINGS: The axial and coronal sequences incidentally include the left lower leg. There is marked subcutaneous edema of both lower legs which appears slightly worse on the left. No focal subcutaneous fluid collection is identified. Mildly increased T2 signal is seen in the gastrocnemius, soleus and and tibialis posterior muscles bilaterally which appears symmetric. No intramuscular fluid collection is identified. No bone marrow signal abnormality to suggest osteomyelitis is seen. There is no muscle or tendon tear.  IMPRESSION: Intense bilateral lower leg  subcutaneous edema appears worse on the left and could be due to dependent change or cellulitis.  Mildly increased T2 signal in the gastrocnemius, soleus and tibialis posterior muscles bilaterally could be due to myositis or early changes of denervation atrophy in this diabetic patient.  Negative for abscess or osteomyelitis.   Electronically Signed   By: Drusilla Kanner M.D.   On: 01/15/2015 08:28   Mr Foot Right Wo Contrast  01/11/2015   CLINICAL DATA:  Diabetic foot ulcers.  EXAM: MRI OF THE RIGHT FOREFOOT WITHOUT CONTRAST  TECHNIQUE: Multiplanar, multisequence MR imaging was performed. No  intravenous contrast was administered.  COMPARISON:  Radiographs dated 01/09/2011  FINDINGS: There are small erosions of the proximal phalanx at the IP joint of the great toe and there are small erosions at the first metatarsal phalangeal joint, both consistent with gout. There are no soft tissue abscesses. There is no osteomyelitis. There is nonspecific edema in the subcutaneous fat of the dorsum of the foot. Muscles and tendons and ligaments appear appear normal. No joint effusions.  IMPRESSION: Subcutaneous edema primarily on the dorsum of the foot. 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   Electronically Signed   By: Geanie Cooley M.D.   On: 01/11/2015 12:50   Mr Femur Left Wo Contrast  01/12/2015   CLINICAL DATA:  Diabetes. Peripheral neuropathy. Sepsis. Bilateral thigh pain. RIGHT thigh pain. Tenderness in the medial aspect of the thigh.  EXAM: MR OF THE LEFT FEMUR WITHOUT CONTRAST  TECHNIQUE: Multiplanar, multisequence MR imaging was performed. No intravenous contrast was administered.  COMPARISON:  None.  FINDINGS: Diffuse subcutaneous edema is present around the pelvis and in both thighs, most compatible with anasarca. This could also represent cellulitis in the appropriate clinical setting. Subcutaneous fluid is present in the lateral LEFT thigh, without a discrete abscess. There is myositis of the LEFT vastus lateralis with mild muscular edema av but no significant swelling. No fatty atrophy.  Bone marrow signal shows heterogenous marrow. This is a nonspecific finding most commonly associated with obesity, anemia, cigarette smoking or chronic disease. Negative for hip fracture or AVN. There is no evidence of osteomyelitis. No deep soft tissue abscesses are present.  Bilaterally in the groin, there are prominent lymph nodes which are probably reactive to lower extremity process.  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  IMPRESSION: 1.  Diffuse subcutaneous edema in the pelvis and thighs bilaterally, suggesting anasarca. 2. More prominent subcutaneous edema along the LEFT thigh with subcutaneous fluid superficial to the muscular fascia, likely representing cellulitis. No abscess. 3. Myositis of the proximal vastus lateralis, probably infectious. Reactive edema could produce this appearance as well. 4. Reactive inguinal adenopathy bilaterally. 5. Negative for osteomyelitis.   Electronically Signed   By: Andreas Newport M.D.   On: 01/12/2015 14:33   Mr Foot Left Wo Contrast  01/11/2015   CLINICAL DATA:  Diabetic foot ulcers.  Foot pain.  Bony erosions.  EXAM: MRI OF THE LEFT FOREFOOT WITHOUT CONTRAST  TECHNIQUE: Multiplanar, multisequence MR imaging was performed. No intravenous contrast was administered.  COMPARISON:  Radiograph dated 01/09/2015  FINDINGS: There is slight edema throughout the medial cuneiform. There is artifactual increased signal from the metatarsal heads in from the proximal phalanx of the great toe. There are erosions at IP joint and first metatarsal phalangeal joint consistent with gout.  There are no soft tissue abscesses and there is no osteomyelitis. There is prominent edema in  the subcutaneous fat of the dorsum of the foot.  IMPRESSION: No evidence of osteomyelitis or abscess. The edema in the medial cuneiform is most likely a stress reaction. 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   Electronically Signed   By: Geanie Cooley M.D.   On: 01/11/2015 12:15   Dg Chest Port 1 View  01/26/2015   CLINICAL DATA:  Shortness of breath.  Feels choked when lying flat.  EXAM: PORTABLE CHEST - 1 VIEW  COMPARISON:  01/22/2015  FINDINGS: Cardiac enlargement without vascular congestion. Shallow inspiration with mild atelectasis in the lung bases. Small bilateral pleural effusion. No pneumothorax. No change since prior study.  IMPRESSION: Cardiac enlargement. Atelectasis in the lung bases. Small  bilateral pleural effusion.   Electronically Signed   By: Burman Nieves M.D.   On: 01/26/2015 05:55   Dg Chest Port 1 View  01/10/2015   CLINICAL DATA:  Fever and sweats.  EXAM: PORTABLE CHEST - 1 VIEW  COMPARISON:  None.  FINDINGS: Low lung volumes with vascular crowding and streaky subsegmental atelectasis. No obvious infiltrate or effusion. The heart is within normal limits in size given the AP portable technique. The bony structures are grossly normal.  IMPRESSION: Low lung volumes with vascular crowding and bibasilar atelectasis.   Electronically Signed   By: Loralie Champagne M.D.   On: 01/10/2015 11:27   Dg Foot Complete Left  01/09/2015   CLINICAL DATA:  35 year old male with recurrent bilateral foot pain.  EXAM: LEFT FOOT - COMPLETE 3+ VIEW  COMPARISON:  01/05/2014.  FINDINGS: Large periarticular erosions are noted adjacent to the first MTP joint, and at the first interphalangeal joint along the medial margin in both locations. These have erosions have sclerotic margins and overhanging edges, and there is adjacent soft tissue prominence, suggestive of gouty erosions. No adjacent soft tissue calcification is noted. No acute displaced fracture, subluxation or dislocation. Small heterotopic ossification adjacent to the plantar aspect of the calcaneus, likely ossification within the plantar fascia.  IMPRESSION: 1. Findings, as above, suggestive of underlying gout. Clinical correlation is recommended.   Electronically Signed   By: Trudie Reed M.D.   On: 01/09/2015 09:46   Dg Foot Complete Right  01/09/2015   CLINICAL DATA:  Recurring bilateral foot pain  EXAM: RIGHT FOOT COMPLETE - 3+ VIEW  COMPARISON:  None.  FINDINGS: No fracture or dislocation is seen.  Marginal erosion at the medial aspect of the 1st IP joint, involving the proximal phalanx.  The visualized soft tissues are unremarkable.  IMPRESSION: Marginal erosion at the medial aspect of the 1st IP joint. Correlate for inflammatory  arthropathy such as gout.  Otherwise, no acute osseus abnormality is seen.   Electronically Signed   By: Charline Bills M.D.   On: 01/09/2015 09:44   Dg Fluoro Guide Ndl Plc/bx  01/14/2015   CLINICAL DATA:  Right shoulder pain and effusion.  Bacteremia.  EXAM: RIGHT SHOULDER ASPIRATION UNDER FLUOROSCOPY  FLUOROSCOPY TIME:  0 min 24 seconds  PROCEDURE: Overlying skin prepped with Betadine, draped in the usual sterile fashion, and infiltrated locally with buffered Lidocaine. Twenty gauge spinal needle was placed in the right glenohumeral joint under direct fluoroscopic visualization.  2 cc of bloody cloudy fluid was aspirated from the joint. Mr. Bromwell tolerated the procedure well.  IMPRESSION: Technically successful right shoulder aspiration under fluoroscopy. Fluid sent for laboratory analysis.   Electronically Signed   By: Geanie Cooley M.D.   On:  01/14/2015 09:30    CBC  Recent Labs Lab 01/20/15 0433 01/20/15 1602 01/21/15 0418 01/22/15 0347 01/24/15 0450 01/25/15 0505 01/26/15 0748  WBC 14.3*  --  15.4* 10.9* 10.1 9.0 10.6*  HGB 9.3*  --  8.7* 8.2* 7.7* 7.3* 9.2*  HCT 28.6*  --  27.2* 25.9* 25.0* 23.2* 28.2*  PLT 467*  --  458* 421* 409* 384 430*  MCV 88.3  --  89.2 87.8 88.3 87.5 85.2  MCH 28.7  --  28.5 27.8 27.2 27.5 27.8  MCHC 32.5  --  32.0 31.7 30.8 31.5 32.6  RDW 14.2  --  14.1 14.0 13.6 13.6 13.8  LYMPHSABS 1.5 1.1  --   --   --   --   --   MONOABS 0.9 0.6  --   --   --   --   --   EOSABS 0.2 0.1  --   --   --   --   --   BASOSABS 0.0 0.0  --   --   --   --   --     Chemistries   Recent Labs Lab 01/22/15 1232 01/23/15 0307 01/24/15 0450 01/25/15 0505 01/26/15 0748  NA 133* 134* 136 139 134*  K 3.8 4.1 4.0 4.0 4.1  CL 93* 95* 100 104 99  CO2 29 25 31 31 26   GLUCOSE 97 164* 198* 132* 232*  BUN 20 26* 22 22 22   CREATININE 2.16* 2.27* 2.10* 2.38* 2.42*  CALCIUM 8.4 8.3* 8.1* 7.9* 8.5    ------------------------------------------------------------------------------------------------------------------ estimated creatinine clearance is 57.4 mL/min (by C-G formula based on Cr of 2.42). ------------------------------------------------------------------------------------------------------------------ No results for input(s): HGBA1C in the last 72 hours. ------------------------------------------------------------------------------------------------------------------ No results for input(s): CHOL, HDL, LDLCALC, TRIG, CHOLHDL, LDLDIRECT in the last 72 hours. ------------------------------------------------------------------------------------------------------------------ No results for input(s): TSH, T4TOTAL, T3FREE, THYROIDAB in the last 72 hours.  Invalid input(s): FREET3 ------------------------------------------------------------------------------------------------------------------  Recent Labs  01/25/15 1841  FOLATE 6.9  FERRITIN 436*  TIBC 153*  IRON 16*  RETICCTPCT 1.9    Coagulation profile No results for input(s): INR, PROTIME in the last 168 hours.  No results for input(s): DDIMER in the last 72 hours.  Cardiac Enzymes No results for input(s): CKMB, TROPONINI, MYOGLOBIN in the last 168 hours.  Invalid input(s): CK ------------------------------------------------------------------------------------------------------------------ Invalid input(s): POCBNP    Owynn Mosqueda D.O. on 01/26/2015 at 12:50 PM  Between 7am to 7pm - Pager - [phone]  After 7pm go to [email or web address] - password [email or web address]  And look for the night coverage person covering for me after hours  [facility] Office  [phone]

## 2015-01-27 ENCOUNTER — Inpatient Hospital Stay (HOSPITAL_COMMUNITY): Payer: Medicaid Other

## 2015-01-27 ENCOUNTER — Telehealth: Payer: Self-pay | Admitting: Cardiovascular Disease

## 2015-01-27 ENCOUNTER — Encounter (HOSPITAL_COMMUNITY): Payer: Self-pay | Admitting: Dentistry

## 2015-01-27 ENCOUNTER — Other Ambulatory Visit: Payer: Self-pay | Admitting: *Deleted

## 2015-01-27 DIAGNOSIS — I119 Hypertensive heart disease without heart failure: Secondary | ICD-10-CM

## 2015-01-27 DIAGNOSIS — I340 Nonrheumatic mitral (valve) insufficiency: Secondary | ICD-10-CM

## 2015-01-27 DIAGNOSIS — I739 Peripheral vascular disease, unspecified: Secondary | ICD-10-CM

## 2015-01-27 DIAGNOSIS — K056 Periodontal disease, unspecified: Secondary | ICD-10-CM

## 2015-01-27 LAB — BASIC METABOLIC PANEL
ANION GAP: 6 (ref 5–15)
BUN: 18 mg/dL (ref 6–23)
CALCIUM: 8.4 mg/dL (ref 8.4–10.5)
CO2: 29 mmol/L (ref 19–32)
Chloride: 101 mmol/L (ref 96–112)
Creatinine, Ser: 2.24 mg/dL — ABNORMAL HIGH (ref 0.50–1.35)
GFR calc Af Amer: 42 mL/min — ABNORMAL LOW (ref >90)
GFR calc non Af Amer: 36 mL/min — ABNORMAL LOW (ref >90)
Glucose, Bld: 136 mg/dL — ABNORMAL HIGH (ref 70–99)
POTASSIUM: 3.3 mmol/L — AB (ref 3.5–5.1)
Sodium: 136 mmol/L (ref 135–145)

## 2015-01-27 LAB — GLUCOSE, CAPILLARY
GLUCOSE-CAPILLARY: 134 mg/dL — AB (ref 70–99)
GLUCOSE-CAPILLARY: 119 mg/dL — AB (ref 70–99)
Glucose-Capillary: 136 mg/dL — ABNORMAL HIGH (ref 70–99)
Glucose-Capillary: 138 mg/dL — ABNORMAL HIGH (ref 70–99)

## 2015-01-27 LAB — TYPE AND SCREEN
ABO/RH(D): B POS
Antibody Screen: NEGATIVE
Unit division: 0
Unit division: 0

## 2015-01-27 LAB — CBC
HEMATOCRIT: 28 % — AB (ref 39.0–52.0)
HEMOGLOBIN: 9.1 g/dL — AB (ref 13.0–17.0)
MCH: 27.7 pg (ref 26.0–34.0)
MCHC: 32.5 g/dL (ref 30.0–36.0)
MCV: 85.4 fL (ref 78.0–100.0)
Platelets: 378 10*3/uL (ref 150–400)
RBC: 3.28 MIL/uL — ABNORMAL LOW (ref 4.22–5.81)
RDW: 13.9 % (ref 11.5–15.5)
WBC: 7.1 10*3/uL (ref 4.0–10.5)

## 2015-01-27 LAB — OCCULT BLOOD X 1 CARD TO LAB, STOOL: FECAL OCCULT BLD: NEGATIVE

## 2015-01-27 MED ORDER — POTASSIUM CHLORIDE CRYS ER 20 MEQ PO TBCR
40.0000 meq | EXTENDED_RELEASE_TABLET | Freq: Once | ORAL | Status: AC
  Administered 2015-01-27: 40 meq via ORAL
  Filled 2015-01-27: qty 2

## 2015-01-27 MED ORDER — GLUCERNA SHAKE PO LIQD
237.0000 mL | Freq: Two times a day (BID) | ORAL | Status: DC
  Administered 2015-01-27 – 2015-01-28 (×3): 237 mL via ORAL

## 2015-01-27 MED ORDER — FUROSEMIDE 40 MG PO TABS
40.0000 mg | ORAL_TABLET | Freq: Every day | ORAL | Status: DC
  Administered 2015-01-27 – 2015-01-28 (×2): 40 mg via ORAL
  Filled 2015-01-27 (×3): qty 1

## 2015-01-27 NOTE — Progress Notes (Signed)
Patient: Dwayne [name] / Admit Date: 01/09/2015 / Date of Encounter: 01/27/2015, 8:06 AM   Subjective: Mouth still hurting. Shoulder still hurting. Breathing stable. No CP.   Objective: Telemetry: NSR Physical Exam: Blood pressure 169/87, pulse 85, temperature 98.6 F (37 C), temperature source Oral, resp. rate 16, height  (1.88 m), weight 244 lb 0.8 oz (110.7 kg), SpO2 100 %. General: Well developed M in no acute distress. Head: Normocephalic, atraumatic, sclera non-icteric, no xanthomas, nares are without discharge. Neck: JVP not elevated. Lungs: Decreased BS at bases, no rales todayy. No wheezes or rhonchi. Breathing is unlabored. Heart: RRR S1 S2 with soft systolic murmur at apex, no rubs, or gallops.  Abdomen: Soft, non-tender, non-distended with normoactive bowel sounds. No rebound/guarding. Extremities: Tr-1+ BLE pedal edema. Right groin site without hematoma, ecchymosis or bruit. Neuro: A+Ox3 but appears tired. Psych: Flat affect. Does not appear eager to participate in our interaction.    Intake/Output Summary (Last 24 hours) at 01/27/15 0806 Last data filed at 01/27/15 0449  Gross per 24 hour  Intake   1080 ml  Output   1950 ml  Net   -870 ml    Inpatient Medications:  . ampicillin (OMNIPEN) IV  2 g Intravenous 6 times per day  . chlorhexidine  15 mL Mouth/Throat BID  . feeding supplement (PRO-STAT SUGAR FREE 64)  30 mL Oral Q24H  . feeding supplement (RESOURCE BREEZE)  1 Container Oral BID PC  . hydrALAZINE  25 mg Oral 3 times per day  . Influenza vac split quadrivalent PF  0.5 mL Intramuscular Tomorrow-1000  . insulin aspart  0-15 Units Subcutaneous TID WC  . insulin aspart  0-5 Units Subcutaneous QHS  . insulin aspart protamine- aspart  14 Units Subcutaneous BID WC  . living well with diabetes book   Does not apply Once  . metoprolol tartrate  75 mg Oral BID  . neomycin-bacitracin-polymyxin   Topical Daily  . nicotine  7 mg Transdermal Daily  .  polyethylene glycol  17 g Oral Daily  . senna-docusate  2 tablet Oral BID   Infusions:  . sodium chloride 10 mL/hr at 01/25/15 0924  . sodium chloride    . sodium chloride irrigation      Labs:  Recent Labs  01/26/15 0748 01/27/15 0555  NA 134* 136  K 4.1 3.3*  CL 99 101  CO2 26 29  GLUCOSE 232* 136*  BUN 22 18  CREATININE 2.42* 2.24*  CALCIUM 8.5 8.4     Recent Labs  01/26/15 0748 01/27/15 0555  WBC 10.6* 7.1  HGB 9.2* 9.1*  HCT 28.2* 28.0*  MCV 85.2 85.4  PLT 430* 378    Recent Labs  01/26/15 0748  CKTOTAL 166    Radiology/Studies:  Dg Chest Port 1 View 01/26/2015   CLINICAL DATA:  Shortness of breath.  Feels choked when lying flat.  EXAM: PORTABLE CHEST - 1 VIEW  COMPARISON:  01/22/2015  FINDINGS: Cardiac enlargement without vascular congestion. Shallow inspiration with mild atelectasis in the lung bases. Small bilateral pleural effusion. No pneumothorax. No change since prior study.  IMPRESSION: Cardiac enlargement. Atelectasis in the lung bases. Small bilateral pleural effusion.   Electronically Signed   By: Burman Nieves M.D.   On: 01/26/2015 05:55     Assessment and Plan  35 y/o M with DM c/b peripheral neuropathy and foot ulcer history (debridement 12/2013), tobacco abuse admitted with fevers, diarrhea, fatigue and found to have septic shock, MSSA bacetremia, elevated troponin,  acute renal failure, dehydration, abnormal ABIs, poor dental status, osteomyelitis, septic emboli to the brain, and pericardial effusion.   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 Other valves appear normal. Normal LV function. Moderate to severe LVH. - LHC 01/24/15:  normal cors, mod pulm HTN, elevated LV filling pressures with large V wave c/w severe MR - s/p dental extraction 01/25/15 - ID also following - on ampicillin - they are worried for large burden of disease with slow downtrend of WBC - TCTS input: favoring a 6 week course of antibiotics before proceding with mitral valve repair/replacement - however, due to persistent leukocytosis and pain in numerous places (presumably related to septic arthritis and myositis), he is probably not currently a candidate for outpatient therapy; mreover, the fact that the organism involved is Staphylococcus aureus he still has the potential to deteriorate clinically without much warning - most recently they feel there is no indication for urgent MV repair/replacement although he will clearly need surgery at some point  2. Elevated troponin c/w demand ischemia - cath with normal cors  3. 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 Acute renal insufficiency - ? CKD - Cr was 1.37 in 11/2013; peak Cr 3.15 this admission: possibly embolic in etiology, then plateaued around 2 - renal US 1/13: increased echogenicity, cannot exclude medical renal disease; no other acute findings - slight increase 2.1-> 2.42 post-cath but improving following diuresis  5. Pericarditis with small fibrinous pericardial effusion by TEE 01/13/15 - Dr. Anne FuSkains recommended consideration by TCTS for pericardial stripping as he felt this may be challenging to tx with abx alone - follow clinically  6. Worsening anemia with thrombocytosis, per IM  - Hgb 11.7 gradually ? 7.3 yesterday - ?r/t acute illness - low iron but high ferritin, low TIBC - s/p 2 u PRBCs 01/25/15  7. Hypokalemia - will replete with 40meq PO today  (cautious given renal function)  8. 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 Edilia Boickson did not feel there was any utility in a more aggressive workup of his decreased ABI on the left - 01/15/15: LLE venous duplex neg for DVT  9. 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 Multiple septic emboli to the brain by MRI 01/14/15 11. Hyponatremia 12. Hypoalbuminemia    Signed, Dayna Dunn PA-C  Agree with findings by Ronie Spiesayna Dunn PA-C  Diuresing. Not very communicative this AM. On ATBX per ID. WBC steadily declining. Mod RI with SCR increased 2.0---> 2.4 prob secondary to cath but slowly declining. Still has 2+ pitting edema but not SOB. On approp meds. Plan remains 6 wks of IV ATBX followed by [facility]. We will S/O. Call with further questions.   Runell GessJonathan J. Tanishi Nault, M.D., FACP, Patients Choice Medical CenterFACC, Kathryne ErikssonFAHA, FSCAI [facility] [phone] [place], [place]  [address]  [phone] 01/27/2015 9:14 AM

## 2015-01-27 NOTE — Progress Notes (Signed)
Since patient may be discharged soon, made tentative [facility], InOC office visit on 02/04/15 to follow-up in our office. Appt on AVS. Reylynn Vanalstine PA-C

## 2015-01-27 NOTE — Progress Notes (Signed)
POST OPERATIVE NOTE:  01/27/2015 Dwayne HickmanBenjamin [name] [id number]  VITALS: BP 169/87 mmHg  Pulse 85  Temp(Src) 98.6 F (37 C) (Oral)  Resp 16  Ht 6\' 2"  (1.88 m)  Wt 244 lb 0.8 oz (110.7 kg)  BMI 31.32 kg/m2  SpO2 100%  LABS:  Lab Results  Component Value Date   WBC 7.1 01/27/2015   HGB 9.1* 01/27/2015   HCT 28.0* 01/27/2015   MCV 85.4 01/27/2015   PLT 378 01/27/2015   BMET    Component Value Date/Time   NA 136 01/27/2015 0555   K 3.3* 01/27/2015 0555   CL 101 01/27/2015 0555   CO2 29 01/27/2015 0555   GLUCOSE 136* 01/27/2015 0555   BUN 18 01/27/2015 0555   CREATININE 2.24* 01/27/2015 0555   CREATININE 1.24 03/16/2014 1239   CALCIUM 8.4 01/27/2015 0555   GFRNONAA 36* 01/27/2015 0555   GFRAA 42* 01/27/2015 0555    Lab Results  Component Value Date   INR 1.22 01/14/2015   No results found for: PTT   Dwayne [name] is status post multiple extractions with alveoloplasty and gross treatment remaining dentition the operating room on 01/25/2015. Patient is also status post 2 units of packed red blood cell transfusion on the night of the surgery.  SUBJECTIVE: Patient indicates that he is having some discomfort with dental extraction sites. Patient, however, again remains mainly nonverbal with his response to questions.   EXAM: There is no sign of infection, heme, or ooze. Sutures are intact. Clots are present. Patient with some generalized ecchymoses and swelling. Patient has generalized primary closure from extraction sites.  ASSESSMENT: Post operative course is consistent with dental procedures performed in the operating room.  PLAN: 1. Continue chlorhexidine rinses twice daily. 2. Use salt water rinses in between the chlorhexidine rinses to aid healing. 3. Maintain oral hygiene with brushing after meals and at bedtime. Brush tongue daily. 4. Advance diet as tolerated to soft mechanical diet. 5. Sutures will dissolve on their own in 7-10 days. 6. Follow up  with general dentist once medically stable for evaluation for other comprehensive dental needs.  Charlynne Panderonald F. Kulinski, DDS

## 2015-01-27 NOTE — Progress Notes (Signed)
Utilization review completed.  

## 2015-01-27 NOTE — Progress Notes (Signed)
[facility]                                                                              Patient Demographics  Dwayne [name], is a 35 y.o. male, DOB - 09-08-1980, ZOX:[id number]  Admit date - 01/09/2015   Admitting Physician Ron Parker, MD  Outpatient Primary MD for the patient is No PCP Per Patient  LOS - 18   Chief Complaint  Patient presents with  . Headache  . Nausea  . Diarrhea      HPI on 01/09/2015 by Dr. Della Goo Dwayne [name] is a 34 y.o. male with a history of Uncontrolled HTN, DM2 who presents to the ED with complaints of diarrhea and fevers and chills x 24 hours. He reports having weakness and fatigue and He denies any nausea or vomiting. No one is sick at home, and he denies taking any recent antibiotic therapy. He reports that he has been out of his medications for months, and has not seen his PCP in months as well. 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  Interim history He was found to have MSSA bacteremia and came in septic shock. He underwent TEE showing MV endocarditis. CTVs consulted and plan for surgery in the next week.He underwent cardiac cath on 1/25 showing normal coronary angiography. Dental medicine consulted and planned for extraction of infected teeth today on 1/26. He will be scheduled for mitral valve surgery as per cardio thoracic surgery.   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 with perforation and severe mitral insufficiency -Cardiothoracic surgery recommended six-week course of antibiotics before proceeding with mitral valve repair or replacement however due to septic arthritis and myositis, with deterioration, patient may warrant intervention sooner rather than  later -Left heart cath on 01/24/2015 showed normal coronaries, moderate pulmonary hypertension with elevated LV filling pressures with large V wave and severe MR -Patient underwent dental extraction of 20 teeth on 01/25/2015 -Cardiology/Infectious disease consulted and appreciated -Continue ampicillin -Cardiothoracic surgery recommends continuing medical therapy with anticipation of MV surgery at some point.  -Spoke with ID (1/28)-Dr. Drue Second.  Will ask IR to joint aspiration to follow cell count and culture -Will speak to CM regarding possible home health options  -Will order PICC line placement for 1/29  Elevated troponin -Likely secondary to demand ischemia -Heart catheterization showed normal coronary arteries -Cardiology has signed off (1/28) and will follow with patient as an outpatient  Dental Caries -Removed by denistry -Spoke with Dr. Kristin Bruins who states the sutures will dissolve on their own -Patient will need to followup with DDS as an outpatient  Acute diastolic heart failure -Complicated by valvular disease and transfusion of 2 units packed red blood cells -Cardiology consultation appreciated -Patient was started on IV Lasix,- has good UOP 1950 over the past 24 hours -Continue to monitor intake and output as well as daily weights  Acute renal insufficiency/ ? 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, no need for aggressive workup  Normocytic  Anemia  -Patient was given 2 units packed red blood cells 01/25/2015 -Anemia panel showed low iron but high ferritin (possibly acute phase reactant)   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, my ascites of L4-L5 -Treatment plan as above -Patient had fluoroguided aspiration, cultures show no growth, however elevated cell count -Will ask IR for repeat aspiration -Patient seen by orthopedic surgery, no surgical intervention for now  Diabetes mellitus -Poorly controlled -Hemoglobin A1c 8.1 -Patient admits to being noncompliant medications -Diabetes coordinator consulted and appreciated -Continue insulin sliding scale CBG monitoring and 70/30  Diarrhea -Cdiff PCR negative  Hypertension -Continue lopressor and hydralazine PRN  Code Status: Full  Family Communication: Girlfriend at bedside  Disposition Plan: Admitted  Time Spent in minutes   30 minutes  Procedures  Echocardiogram (TTE) TEE ABI Lower ext doppler Right heart Cath, LV angiography Dental extraction Fluoroguided aspiration of shoulder  Consults   Cardiology Infectious Disease Dentistry Nephrology Orthopedic surgery Cardiothoracic surgery Vascular surgery  DVT Prophylaxis  SCDs  Lab Results  Component Value Date   PLT 378 01/27/2015    Medications  Scheduled Meds: . ampicillin (OMNIPEN) IV  2 g Intravenous 6 times per day  . chlorhexidine  15 mL Mouth/Throat BID  . feeding supplement (PRO-STAT SUGAR FREE 64)  30 mL Oral Q24H  . feeding supplement (RESOURCE BREEZE)  1 Container Oral BID PC  . furosemide  40 mg Oral Daily  . hydrALAZINE  25 mg Oral 3 times per day  . Influenza vac split quadrivalent PF  0.5 mL Intramuscular Tomorrow-1000  . insulin aspart  0-15 Units Subcutaneous TID WC  . insulin aspart  0-5 Units Subcutaneous QHS  . insulin aspart protamine- aspart  14 Units Subcutaneous BID WC  . living well with diabetes book   Does not apply Once  . metoprolol tartrate  75 mg  Oral BID  . neomycin-bacitracin-polymyxin   Topical Daily  . nicotine  7 mg Transdermal Daily  . polyethylene glycol  17 g Oral Daily  . potassium chloride  40 mEq Oral Once  . senna-docusate  2 tablet Oral BID   Continuous Infusions: . sodium chloride 10 mL/hr at 01/25/15 0924  . sodium chloride    . sodium chloride irrigation     PRN Meds:.acetaminophen **OR** acetaminophen, alum & mag hydroxide-simeth, hydrALAZINE, HYDROmorphone (DILAUDID) injection, morphine injection, ondansetron **OR** ondansetron (ZOFRAN) IV, oxyCODONE  Antibiotics    Anti-infectives    Start     Dose/Rate Route Frequency Ordered Stop   01/19/15 2000  ampicillin (OMNIPEN) 2 g in sodium chloride 0.9 % 50 mL IVPB     2 g150 mL/hr over 20 Minutes Intravenous 6 times per day 01/19/15 1121     01/18/15 2000  penicillin G potassium 12 Million Units in dextrose 5 % 500 mL continuous infusion     12 Million Units41.7 mL/hr over 12 Hours Intravenous Every 12 hours 01/18/15 1432 01/19/15 2050   01/15/15 1600  nafcillin 2 g in dextrose 5 % 50 mL IVPB  Status:  Discontinued  2 g100 mL/hr over 30 Minutes Intravenous 6 times per day 01/15/15 1503 01/15/15 1517   01/15/15 1600  nafcillin 2 g in dextrose 5 % 50 mL IVPB     2 g100 mL/hr over 30 Minutes Intravenous 6 times per day 01/15/15 1517 01/18/15 1753   01/15/15 0930  nafcillin injection 2 g  Status:  Discontinued     2 g Intravenous Every 4 hours 01/15/15 0921 01/15/15 1503   01/14/15 1500  DAPTOmycin (CUBICIN) 600 mg in sodium chloride 0.9 % IVPB  Status:  Discontinued     600 mg224 mL/hr over 30 Minutes Intravenous Every 48 hours 01/12/15 1421 01/13/15 1239   01/13/15 1500  ceFAZolin (ANCEF) IVPB 2 g/50 mL premix  Status:  Discontinued     2 g100 mL/hr over 30 Minutes Intravenous 3 times per day 01/13/15 1407 01/15/15 0921   01/12/15 1600  ceFAZolin (ANCEF) IVPB 2 g/50 mL premix  Status:  Discontinued     2 g100 mL/hr over 30 Minutes Intravenous Every 12 hours  01/12/15 1419 01/13/15 1407   01/11/15 1200  DAPTOmycin (CUBICIN) 600 mg in sodium chloride 0.9 % IVPB  Status:  Discontinued     600 mg224 mL/hr over 30 Minutes Intravenous Every 24 hours 01/11/15 1003 01/12/15 1421   01/10/15 1200  vancomycin (VANCOCIN) IVPB 1000 mg/200 mL premix  Status:  Discontinued     1,000 mg200 mL/hr over 60 Minutes Intravenous Every 8 hours 01/10/15 1044 01/11/15 0947   01/10/15 1100  piperacillin-tazobactam (ZOSYN) IVPB 3.375 g  Status:  Discontinued     3.375 g12.5 mL/hr over 240 Minutes Intravenous Every 8 hours 01/10/15 1043 01/11/15 0902   01/09/15 0700  metroNIDAZOLE (FLAGYL) IVPB 500 mg  Status:  Discontinued     500 mg100 mL/hr over 60 Minutes Intravenous Every 8 hours 01/09/15 0651 01/10/15 1018      Subjective:   Dwayne [name] seen and examined today.  Patient is not very verbal at this time but does complain of mouth pain and states "wires are hurting him."  He refuses to open his mouth.  Patient states he is doing "so/so."  Denies chest pain or shortness of breath.  Objective:   Filed Vitals:   01/26/15 0453 01/26/15 1356 01/26/15 2150 01/27/15 0446  BP: 164/86 160/90 158/96 169/87  Pulse: 90 82 94 85  Temp:  98.2 F (36.8 C) 98.8 F (37.1 C) 98.6 F (37 C)  TempSrc:  Oral Oral Oral  Resp: 28 18 18 16   Height:      Weight:    110.7 kg (244 lb 0.8 oz)  SpO2: 95% 98% 100% 100%    Wt Readings from Last 3 Encounters:  01/27/15 110.7 kg (244 lb 0.8 oz)  01/28/14 100.699 kg (222 lb)  01/16/14 100.744 kg (222 lb 1.6 oz)     Intake/Output Summary (Last 24 hours) at 01/27/15 1013 Last data filed at 01/27/15 0800  Gross per 24 hour  Intake   1320 ml  Output   1950 ml  Net   -630 ml    Exam  General: Well developed, well nourished, NAD, appears stated age  HEENT: NCAT, mucous membranes moist.   Cardiovascular: S1 S2 auscultated, 2/6 SEM, Regular rate and rhythm.  Respiratory: Patient does not breath in deeply, Diminished breath  sounds, cannot appreciate wheezing or crackles.  Abdomen: Soft, nontender, nondistended, + bowel sounds  Extremities: warm dry without cyanosis clubbing. +2 pitting edema in LE B/L  Neuro: AAOx3, nonfocal  Psych: Flat affect  Data Review   Micro Results Recent Results (from the past 240 hour(s))  Culture, blood (routine x 2)     Status: None   Collection Time: 01/18/15  6:10 PM  Result Value Ref Range Status   Specimen Description BLOOD LEFT ARM  Final   Special Requests BOTTLES DRAWN AEROBIC AND ANAEROBIC 10CC  Final   Culture   Final    NO GROWTH 5 DAYS Performed at [facility]    Report Status 01/25/2015 FINAL  Final  Culture, blood (routine x 2)     Status: None   Collection Time: 01/18/15  6:18 PM  Result Value Ref Range Status   Specimen Description BLOOD RIGHT ARM  Final   Special Requests BOTTLES DRAWN AEROBIC AND ANAEROBIC 10CC  Final   Culture   Final    NO GROWTH 5 DAYS Performed at [facility]    Report Status 01/25/2015 FINAL  Final  Surgical pcr screen     Status: None   Collection Time: 01/25/15  4:47 AM  Result Value Ref Range Status   MRSA, PCR NEGATIVE NEGATIVE Final   Staphylococcus aureus NEGATIVE NEGATIVE Final    Comment:        The Xpert SA Assay (FDA approved for NASAL specimens in patients over 23 years of age), is one component of a comprehensive surveillance program.  Test performance has been validated by [facility] for patients greater than or equal to 68 year old. It is not intended to diagnose infection nor to guide or monitor treatment.     Radiology Reports Dg Orthopantogram  01/14/2015   CLINICAL DATA:  Staphylococcal endocarditis.  Poor dentition.  EXAM: ORTHOPANTOGRAM/PANORAMIC  COMPARISON:  None.  FINDINGS: Extensive dental disease with caries involving multiple teeth, including numbers 7, 8, 9, 10, 11, 12, 13, 14, 15, 16, 19, 20, 29, 30, 31 and 32. Periapical lucencies are present adjacent to teeth  numbers 29 and 30.  IMPRESSION: Extensive dental disease as described.   Electronically Signed   By: Hulan Saas M.D.   On: 01/14/2015 21:31   Dg Chest 1 View  01/22/2015   CLINICAL DATA:  CHF, increasing right-sided chest pain  EXAM: CHEST - 1 VIEW  COMPARISON:  01/14/2015  FINDINGS: Cardiac shadow is mildly enlarged. Bibasilar atelectatic changes and effusions are seen slightly increased from the prior exam. No other focal abnormality is noted.  IMPRESSION: Slight increase in bibasilar changes.   Electronically Signed   By: Alcide Clever M.D.   On: 01/22/2015 09:57   Dg Chest 2 View  01/14/2015   CLINICAL DATA:  Right shoulder pain for 6 days.  EXAM: CHEST  2 VIEW  COMPARISON:  01/10/2015  FINDINGS: Two views of the chest demonstrate low lung volumes with bibasilar chest densities, left side greater than right. In addition, the heart appears to be enlarged. The configuration of the heart raises the possibility for pericardial fluid. No evidence for pulmonary edema. Difficult to exclude small effusions, particularly on the left side.  IMPRESSION: Low lung volumes with bibasilar densities, left side greater than right. Basilar densities could represent a combination of atelectasis and pleural fluid.  Heart appears enlarged which may be accentuated by low lung volumes. Pericardial fluid cannot be excluded.   Electronically Signed   By: Richarda Overlie M.D.   On: 01/14/2015 08:17   Dg Shoulder Right  01/09/2015   CLINICAL DATA:  Anterior right shoulder pain  EXAM: RIGHT SHOULDER - 2+  VIEW  COMPARISON:  None.  FINDINGS: No fracture or dislocation is seen.  The joint spaces are preserved.  The visualized soft tissues are unremarkable.  Visualized right lung is clear.  IMPRESSION: No fracture or dislocation is seen.   Electronically Signed   By: Charline BillsSriyesh  Krishnan M.D.   On: 01/09/2015 09:44   Mr Brain Wo Contrast  01/14/2015   CLINICAL DATA:  [age] year old male with history of staph aureus bacteremia and  endocarditis.  EXAM: MRI HEAD WITHOUT CONTRAST  TECHNIQUE: Multiplanar, multiecho pulse sequences of the brain and surrounding structures were obtained without intravenous contrast.  COMPARISON:  Prior study from 04/25/2012  FINDINGS: Cerebral volume within normal limits for patient age. No significant chronic small vessel ischemic changes identified. No mass lesion, midline shift, or mass effect. Ventricles are normal in size without evidence of hydrocephalus. No extra-axial fluid collection.  There are multiple subcentimeter ischemic infarcts involving predominantly the supratentorial brain. These involve both frontal lobes, the right parietal lobe, left temporal lobe, ischemic infarct within the right is cerebral hemisphere is located in the anterior right frontal lobe and measures 5 mm (series 3, image 36). The largest infarct in the left cerebral hemisphere is located in the deep white matter of the anterior left frontal lobe and measures 13 mm (series 3, image 30). There are infarcts involving the bilateral basal ganglia, the most prominent of which located in the right thalamus which measures 4 mm. There is a single infratentorial infarct within the left cerebellar hemisphere measuring 4 mm. These are most likely embolic in nature given the history of endocarditis and multiple vascular distributions. Scattered susceptibility artifact seen associated with a few of these infarcts, likely reflecting small petechial hemorrhages. No frank hemorrhagic conversion There is associated T2/FLAIR signal intensity without significant mass effect.  Craniocervical junction within normal limits. Pituitary gland normal.  No acute abnormality seen about the orbits.  Paranasal sinuses are clear.  No mastoid effusion.  Bone marrow signal intensity within normal limits. Visualized upper cervical spine unremarkable.  Scalp soft tissues within normal limits.  IMPRESSION: Multi focal ischemic predominately subcentimeter and  supratentorial ischemic infarcts as above. These are likely embolic in nature given the history of endocarditis and involvement of multiple vascular distributions. No significant mass effect. There are likely scattered small petechial hemorrhages with a number these infarcts without frank hemorrhage.   Electronically Signed   By: Rise MuBenjamin  McClintock M.D.   On: 01/14/2015 21:24   Mr Thoracic Spine Wo Contrast  01/15/2015   ADDENDUM REPORT: 01/15/2015 14:40  ADDENDUM: Study discussed by telephone with Dr. Paulette BlanchORNELIUS VAN DAM on 01/15/2015 at 1435 hrs.   Electronically Signed   By: Augusto GambleLee  Hall M.D.   On: 01/15/2015 14:40   01/15/2015   CLINICAL DATA:  35 year old male with sepsis and back pain. Into card itis, purulent pericarditis, multiple small embolic infarcts. Initial encounter.  EXAM: MRI THORACIC AND LUMBAR SPINE WITHOUT CONTRAST  TECHNIQUE: Multiplanar and multiecho pulse sequences of the thoracic and lumbar spine were obtained without intravenous contrast.  COMPARISON:  Chest radiographs 01/14/2015. CT Abdomen and Pelvis 05/18/2013.  FINDINGS: MR THORACIC SPINE FINDINGS  Limited sagittal imaging of the cervical spine is unremarkable.  Preserved thoracic vertebral height and alignment, preserved thoracic vertebral height and alignment.  Throughout much of the T3 and T4 vertebral bodies there is heterogeneous T2 signal as well as patchy pre STIR signal. The STIR images were repeated, an the same patchy increased signal persisted at T3 and T4.  No  definite signal abnormality in the intervening disc, or adjacent disc spaces; there is widespread intervertebral disc T2 hyperintensity, as a normal finding seen in young patients.  No definite paraspinal or epidural inflammation at those levels.  No suspicious marrow signal elsewhere; intrinsic T1 and T2 hyperintensity at T6 is a benign vertebral body hemangioma.  However, there are layering pleural effusions left greater than right. Negative visualized upper abdominal  viscera.  Epidural lipomatosis throughout the thoracic spine and effaces CSF from the thecal sac from the T5 to the T9 level. No definite spinal cord signal abnormality.  MR LUMBAR SPINE FINDINGS  In the lumbar spine T1-T2 and stir marrow signal appears diminished. This is also true in the visible pelvis. Possible associated decreased T2 signal in both the liver and spleen.  Otherwise negative visualized abdominal viscera.  There is mildly increased STIR signal in the lower lumbar erector spinae muscles bilaterally, at L4-L5 (series 17, images 9 and 1). Subcutaneous edema incidentally noted.  Normal lumbar vertebral height and alignment and no marrow edema or acute osseous abnormality in the lumbar spine.  No definite signal abnormality in the lower thoracic spinal cord, conus at L1-L2.  Lumbar facet hypertrophy.  No lumbar spinal stenosis.  IMPRESSION: MR THORACIC SPINE IMPRESSION  1. Abnormal marrow edema in the T3 and T4 vertebral bodies highly suspicious for acute osteomyelitis in this setting. No associated epidural or paraspinal inflammation identified. 2. No other acute findings in the thoracic spine. Epidural lipomatosis. 3. Left greater than right layering pleural effusions.  MR LUMBAR SPINE IMPRESSION  1. Mild increased signal in the L4-L5 level erector spinae muscles bilaterally, which could be infectious myositis but also this appearance can be seen without infection in chronically debilitated patients. 2. No other acute finding in the lumbar spine. 3. Decreased bone marrow signal suggestive of red marrow conversion versus hemosiderosis.  Electronically Signed: By: Augusto [name] M.D. On: 01/15/2015 14:20   Mr Lumbar Spine Wo Contrast  01/15/2015   ADDENDUM REPORT: 01/15/2015 14:40  ADDENDUM: Study discussed by telephone with Dr. Paulette Blanch DAM on 01/15/2015 at 1435 hrs.   Electronically Signed   By: Augusto [name] M.D.   On: 01/15/2015 14:40   01/15/2015   CLINICAL DATA:  35 year old male with sepsis and  back pain. Into card itis, purulent pericarditis, multiple small embolic infarcts. Initial encounter.  EXAM: MRI THORACIC AND LUMBAR SPINE WITHOUT CONTRAST  TECHNIQUE: Multiplanar and multiecho pulse sequences of the thoracic and lumbar spine were obtained without intravenous contrast.  COMPARISON:  Chest radiographs 01/14/2015. CT Abdomen and Pelvis 05/18/2013.  FINDINGS: MR THORACIC SPINE FINDINGS  Limited sagittal imaging of the cervical spine is unremarkable.  Preserved thoracic vertebral height and alignment, preserved thoracic vertebral height and alignment.  Throughout much of the T3 and T4 vertebral bodies there is heterogeneous T2 signal as well as patchy pre STIR signal. The STIR images were repeated, an the same patchy increased signal persisted at T3 and T4.  No definite signal abnormality in the intervening disc, or adjacent disc spaces; there is widespread intervertebral disc T2 hyperintensity, as a normal finding seen in young patients.  No definite paraspinal or epidural inflammation at those levels.  No suspicious marrow signal elsewhere; intrinsic T1 and T2 hyperintensity at T6 is a benign vertebral body hemangioma.  However, there are layering pleural effusions left greater than right. Negative visualized upper abdominal viscera.  Epidural lipomatosis throughout the thoracic spine and effaces CSF from the thecal sac from the T5 to  the T9 level. No definite spinal cord signal abnormality.  MR LUMBAR SPINE FINDINGS  In the lumbar spine T1-T2 and stir marrow signal appears diminished. This is also true in the visible pelvis. Possible associated decreased T2 signal in both the liver and spleen.  Otherwise negative visualized abdominal viscera.  There is mildly increased STIR signal in the lower lumbar erector spinae muscles bilaterally, at L4-L5 (series 17, images 9 and 1). Subcutaneous edema incidentally noted.  Normal lumbar vertebral height and alignment and no marrow edema or acute osseous  abnormality in the lumbar spine.  No definite signal abnormality in the lower thoracic spinal cord, conus at L1-L2.  Lumbar facet hypertrophy.  No lumbar spinal stenosis.  IMPRESSION: MR THORACIC SPINE IMPRESSION  1. Abnormal marrow edema in the T3 and T4 vertebral bodies highly suspicious for acute osteomyelitis in this setting. No associated epidural or paraspinal inflammation identified. 2. No other acute findings in the thoracic spine. Epidural lipomatosis. 3. Left greater than right layering pleural effusions.  MR LUMBAR SPINE IMPRESSION  1. Mild increased signal in the L4-L5 level erector spinae muscles bilaterally, which could be infectious myositis but also this appearance can be seen without infection in chronically debilitated patients. 2. No other acute finding in the lumbar spine. 3. Decreased bone marrow signal suggestive of red marrow conversion versus hemosiderosis.  Electronically Signed: By: Augusto [name] M.D. On: 01/15/2015 14:20   US Renal  01/12/2015   CLINICAL DATA:  Acute renal failure  EXAM: RENAL/URINARY TRACT ULTRASOUND COMPLETE  COMPARISON:  None.  FINDINGS: Right Kidney:  Length: 12.8 cm in length. Diffuse increased cortical echogenicity suspicious for medical renal disease. No hydronephrosis. No renal calculus.  Left Kidney:  Length: . 12.6 cm in length. Diffuse increased echogenicity suspicious for medical renal disease. No hydronephrosis. No diagnostic renal calculus.  Bladder:  Appears normal for degree of bladder distention.  IMPRESSION: Bilateral kidneys shows diffuse increased echogenicity. Medical renal disease cannot be excluded. No hydronephrosis or diagnostic renal calculus.   Electronically Signed   By: Natasha Mead M.D.   On: 01/12/2015 16:08   Mr Shoulder Right Wo Contrast  01/11/2015   CLINICAL DATA:  Right shoulder pain.  EXAM: MRI OF THE RIGHT SHOULDER WITHOUT CONTRAST  TECHNIQUE: Multiplanar, multisequence MR imaging of the shoulder was performed. No intravenous contrast  was administered.  COMPARISON:  Radiographs dated 01/09/2015  FINDINGS: Rotator cuff:  Normal.  Muscles:  Normal.  Biceps long head: Properly located and intact. There is inflammation in the soft tissues around the bicipital tendon sheath with fluid in the bicipital tendon sheath.  Acromioclavicular Joint: Small degenerative cyst in the acromion. Otherwise normal AC joint. Type 1 acromion. No bursitis.  Glenohumeral Joint: There is a moderate glenohumeral joint effusion with extravasation of contrast from the subcoracoid recess of the joint into the adjacent soft tissues. Fluid distends the bicipital tendon sheath.  Labrum:  Intact.  Bones: No significant abnormalities. Tiny degenerative cystic areas in the posterior aspect of the greater tuberosity of the proximal humerus.  IMPRESSION: 1. Moderate glenohumeral joint effusion with some extravasation of joint. Fluid distends the bicipital tendon sheath with inflammation/extravasated joint fluid around the bicipital tendon sheath. The possibly of synovitis should be considered. 2. Normal rotator cuff.   Electronically Signed   By: Geanie Cooley M.D.   On: 01/11/2015 11:21   Mr Tibia Fibula Right Wo Contrast  01/15/2015   CLINICAL DATA:  Diabetic patient with bacteremia and right leg pain.  EXAM: MRI  OF LOWER RIGHT EXTREMITY WITHOUT CONTRAST  TECHNIQUE: Multiplanar, multisequence MR imaging of the right lower leg was performed. No intravenous contrast was administered.  COMPARISON:  None.  FINDINGS: The axial and coronal sequences incidentally include the left lower leg. There is marked subcutaneous edema of both lower legs which appears slightly worse on the left. No focal subcutaneous fluid collection is identified. Mildly increased T2 signal is seen in the gastrocnemius, soleus and and tibialis posterior muscles bilaterally which appears symmetric. No intramuscular fluid collection is identified. No bone marrow signal abnormality to suggest osteomyelitis is seen.  There is no muscle or tendon tear.  IMPRESSION: Intense bilateral lower leg subcutaneous edema appears worse on the left and could be due to dependent change or cellulitis.  Mildly increased T2 signal in the gastrocnemius, soleus and tibialis posterior muscles bilaterally could be due to myositis or early changes of denervation atrophy in this diabetic patient.  Negative for abscess or osteomyelitis.   Electronically Signed   By: Drusilla Kanner M.D.   On: 01/15/2015 08:28   Mr Foot Right Wo Contrast  01/11/2015   CLINICAL DATA:  Diabetic foot ulcers.  EXAM: MRI OF THE RIGHT FOREFOOT WITHOUT CONTRAST  TECHNIQUE: Multiplanar, multisequence MR imaging was performed. No intravenous contrast was administered.  COMPARISON:  Radiographs dated 01/09/2011  FINDINGS: There are small erosions of the proximal phalanx at the IP joint of the great toe and there are small erosions at the first metatarsal phalangeal joint, both consistent with gout. There are no soft tissue abscesses. There is no osteomyelitis. There is nonspecific edema in the subcutaneous fat of the dorsum of the foot. Muscles and tendons and ligaments appear appear normal. No joint effusions.  IMPRESSION: Subcutaneous edema primarily on the dorsum of the foot. 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   Electronically Signed   By: Geanie Cooley M.D.   On: 01/11/2015 12:50   Mr Femur Left Wo Contrast  01/12/2015   CLINICAL DATA:  Diabetes. Peripheral neuropathy. Sepsis. Bilateral thigh pain. RIGHT thigh pain. Tenderness in the medial aspect of the thigh.  EXAM: MR OF THE LEFT FEMUR WITHOUT CONTRAST  TECHNIQUE: Multiplanar, multisequence MR imaging was performed. No intravenous contrast was administered.  COMPARISON:  None.  FINDINGS: Diffuse subcutaneous edema is present around the pelvis and in both thighs, most compatible with anasarca. This could also represent cellulitis in the appropriate clinical setting. Subcutaneous fluid is present in the lateral LEFT  thigh, without a discrete abscess. There is myositis of the LEFT vastus lateralis with mild muscular edema av but no significant swelling. No fatty atrophy.  Bone marrow signal shows heterogenous marrow. This is a nonspecific finding most commonly associated with obesity, anemia, cigarette smoking or chronic disease. Negative for hip fracture or AVN. There is no evidence of osteomyelitis. No deep soft tissue abscesses are present.  Bilaterally in the groin, there are prominent lymph nodes which are probably reactive to lower extremity process.  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  IMPRESSION: 1. Diffuse subcutaneous edema in the pelvis and thighs bilaterally, suggesting anasarca. 2. More prominent subcutaneous edema along the LEFT thigh with subcutaneous fluid superficial to the muscular fascia, likely representing cellulitis. No abscess. 3. Myositis of the proximal vastus lateralis, probably infectious. Reactive edema could produce this appearance as well. 4. Reactive inguinal adenopathy bilaterally. 5. Negative for osteomyelitis.   Electronically Signed   By: Andreas Newport M.D.   On:  01/12/2015 14:33   Mr Foot Left Wo Contrast  01/11/2015   CLINICAL DATA:  Diabetic foot ulcers.  Foot pain.  Bony erosions.  EXAM: MRI OF THE LEFT FOREFOOT WITHOUT CONTRAST  TECHNIQUE: Multiplanar, multisequence MR imaging was performed. No intravenous contrast was administered.  COMPARISON:  Radiograph dated 01/09/2015  FINDINGS: There is slight edema throughout the medial cuneiform. There is artifactual increased signal from the metatarsal heads in from the proximal phalanx of the great toe. There are erosions at IP joint and first metatarsal phalangeal joint consistent with gout.  There are no soft tissue abscesses and there is no osteomyelitis. There is prominent edema in the subcutaneous fat of the dorsum of the foot.  IMPRESSION: No evidence of  osteomyelitis or abscess. The edema in the medial cuneiform is most likely a stress reaction. 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   Electronically Signed   By: Geanie Cooley M.D.   On: 01/11/2015 12:15   Dg Chest Port 1 View  01/26/2015   CLINICAL DATA:  Shortness of breath.  Feels choked when lying flat.  EXAM: PORTABLE CHEST - 1 VIEW  COMPARISON:  01/22/2015  FINDINGS: Cardiac enlargement without vascular congestion. Shallow inspiration with mild atelectasis in the lung bases. Small bilateral pleural effusion. No pneumothorax. No change since prior study.  IMPRESSION: Cardiac enlargement. Atelectasis in the lung bases. Small bilateral pleural effusion.   Electronically Signed   By: Burman Nieves M.D.   On: 01/26/2015 05:55   Dg Chest Port 1 View  01/10/2015   CLINICAL DATA:  Fever and sweats.  EXAM: PORTABLE CHEST - 1 VIEW  COMPARISON:  None.  FINDINGS: Low lung volumes with vascular crowding and streaky subsegmental atelectasis. No obvious infiltrate or effusion. The heart is within normal limits in size given the AP portable technique. The bony structures are grossly normal.  IMPRESSION: Low lung volumes with vascular crowding and bibasilar atelectasis.   Electronically Signed   By: Loralie Champagne M.D.   On: 01/10/2015 11:27   Dg Foot Complete Left  01/09/2015   CLINICAL DATA:  35 year old male with recurrent bilateral foot pain.  EXAM: LEFT FOOT - COMPLETE 3+ VIEW  COMPARISON:  01/05/2014.  FINDINGS: Large periarticular erosions are noted adjacent to the first MTP joint, and at the first interphalangeal joint along the medial margin in both locations. These have erosions have sclerotic margins and overhanging edges, and there is adjacent soft tissue prominence, suggestive of gouty erosions. No adjacent soft tissue calcification is noted. No acute displaced fracture, subluxation or dislocation. Small heterotopic ossification adjacent to the plantar aspect of the  calcaneus, likely ossification within the plantar fascia.  IMPRESSION: 1. Findings, as above, suggestive of underlying gout. Clinical correlation is recommended.   Electronically Signed   By: Trudie Reed M.D.   On: 01/09/2015 09:46   Dg Foot Complete Right  01/09/2015   CLINICAL DATA:  Recurring bilateral foot pain  EXAM: RIGHT FOOT COMPLETE - 3+ VIEW  COMPARISON:  None.  FINDINGS: No fracture or dislocation is seen.  Marginal erosion at the medial aspect of the 1st IP joint, involving the proximal phalanx.  The visualized soft tissues are unremarkable.  IMPRESSION: Marginal erosion at the medial aspect of the 1st IP joint. Correlate for inflammatory arthropathy such as gout.  Otherwise, no acute osseus abnormality is seen.   Electronically Signed   By: Charline Bills M.D.   On: 01/09/2015 09:44   Dg Fluoro  Guide Ndl Plc/bx  01/14/2015   CLINICAL DATA:  Right shoulder pain and effusion.  Bacteremia.  EXAM: RIGHT SHOULDER ASPIRATION UNDER FLUOROSCOPY  FLUOROSCOPY TIME:  0 min 24 seconds  PROCEDURE: Overlying skin prepped with Betadine, draped in the usual sterile fashion, and infiltrated locally with buffered Lidocaine. Twenty gauge spinal needle was placed in the right glenohumeral joint under direct fluoroscopic visualization.  2 cc of bloody cloudy fluid was aspirated from the joint. Mr. Gillian tolerated the procedure well.  IMPRESSION: Technically successful right shoulder aspiration under fluoroscopy. Fluid sent for laboratory analysis.   Electronically Signed   By: Geanie Cooley M.D.   On: 01/14/2015 09:30    CBC  Recent Labs Lab 01/20/15 1602  01/22/15 0347 01/24/15 0450 01/25/15 0505 01/26/15 0748 01/27/15 0555  WBC  --   < > 10.9* 10.1 9.0 10.6* 7.1  HGB  --   < > 8.2* 7.7* 7.3* 9.2* 9.1*  HCT  --   < > 25.9* 25.0* 23.2* 28.2* 28.0*  PLT  --   < > 421* 409* 384 430* 378  MCV  --   < > 87.8 88.3 87.5 85.2 85.4  MCH  --   < > 27.8 27.2 27.5 27.8 27.7  MCHC  --   < > 31.7 30.8  31.5 32.6 32.5  RDW  --   < > 14.0 13.6 13.6 13.8 13.9  LYMPHSABS 1.1  --   --   --   --   --   --   MONOABS 0.6  --   --   --   --   --   --   EOSABS 0.1  --   --   --   --   --   --   BASOSABS 0.0  --   --   --   --   --   --   < > = values in this interval not displayed.  Chemistries   Recent Labs Lab 01/23/15 0307 01/24/15 0450 01/25/15 0505 01/26/15 0748 01/27/15 0555  NA 134* 136 139 134* 136  K 4.1 4.0 4.0 4.1 3.3*  CL 95* 100 104 99 101  CO2 25 31 31 26 29   GLUCOSE 164* 198* 132* 232* 136*  BUN 26* 22 22 22 18   CREATININE 2.27* 2.10* 2.38* 2.42* 2.24*  CALCIUM 8.3* 8.1* 7.9* 8.5 8.4   ------------------------------------------------------------------------------------------------------------------ estimated creatinine clearance is 61.5 mL/min (by C-G formula based on Cr of 2.24). ------------------------------------------------------------------------------------------------------------------ No results for input(s): HGBA1C in the last 72 hours. ------------------------------------------------------------------------------------------------------------------ No results for input(s): CHOL, HDL, LDLCALC, TRIG, CHOLHDL, LDLDIRECT in the last 72 hours. ------------------------------------------------------------------------------------------------------------------ No results for input(s): TSH, T4TOTAL, T3FREE, THYROIDAB in the last 72 hours.  Invalid input(s): FREET3 ------------------------------------------------------------------------------------------------------------------  Recent Labs  01/25/15 1841  FOLATE 6.9  FERRITIN 436*  TIBC 153*  IRON 16*  RETICCTPCT 1.9    Coagulation profile No results for input(s): INR, PROTIME in the last 168 hours.  No results for input(s): DDIMER in the last 72 hours.  Cardiac Enzymes No results for input(s): CKMB, TROPONINI, MYOGLOBIN in the last 168 hours.  Invalid input(s):  CK ------------------------------------------------------------------------------------------------------------------ Invalid input(s): POCBNP    Rosalea Withrow D.O. on 01/27/2015 at 10:13 AM  Between 7am to 7pm - Pager - [phone]  After 7pm go to [email or web address] - password [email or web address]  And look for the night coverage person covering for me after hours  [facility] Office  [phone]

## 2015-01-27 NOTE — Telephone Encounter (Signed)
New message     tcm per dayna dunn appt on  2.5.2016 @ 9:15

## 2015-01-28 ENCOUNTER — Inpatient Hospital Stay (HOSPITAL_COMMUNITY)
Admission: EM | Admit: 2015-01-28 | Discharge: 2015-01-28 | Disposition: A | Discharge status: Home / Self Care | Payer: Medicaid Other | Source: Home / Self Care | Attending: Thoracic Surgery (Cardiothoracic Vascular Surgery) | Admitting: Thoracic Surgery (Cardiothoracic Vascular Surgery)

## 2015-01-28 DIAGNOSIS — E1110 Type 2 diabetes mellitus with ketoacidosis without coma: Secondary | ICD-10-CM | POA: Insufficient documentation

## 2015-01-28 DIAGNOSIS — E1310 Other specified diabetes mellitus with ketoacidosis without coma: Secondary | ICD-10-CM

## 2015-01-28 DIAGNOSIS — I340 Nonrheumatic mitral (valve) insufficiency: Secondary | ICD-10-CM

## 2015-01-28 DIAGNOSIS — E09621 Drug or chemical induced diabetes mellitus with foot ulcer: Secondary | ICD-10-CM

## 2015-01-28 DIAGNOSIS — Z01810 Encounter for preprocedural cardiovascular examination: Secondary | ICD-10-CM

## 2015-01-28 LAB — CBC
HEMATOCRIT: 28.2 % — AB (ref 39.0–52.0)
Hemoglobin: 9 g/dL — ABNORMAL LOW (ref 13.0–17.0)
MCH: 27.9 pg (ref 26.0–34.0)
MCHC: 31.9 g/dL (ref 30.0–36.0)
MCV: 87.3 fL (ref 78.0–100.0)
PLATELETS: 338 10*3/uL (ref 150–400)
RBC: 3.23 MIL/uL — ABNORMAL LOW (ref 4.22–5.81)
RDW: 13.8 % (ref 11.5–15.5)
WBC: 6.9 10*3/uL (ref 4.0–10.5)

## 2015-01-28 LAB — BASIC METABOLIC PANEL
ANION GAP: 4 — AB (ref 5–15)
BUN: 13 mg/dL (ref 6–23)
CALCIUM: 8.3 mg/dL — AB (ref 8.4–10.5)
CO2: 29 mmol/L (ref 19–32)
CREATININE: 2.07 mg/dL — AB (ref 0.50–1.35)
Chloride: 105 mmol/L (ref 96–112)
GFR, EST AFRICAN AMERICAN: 46 mL/min — AB (ref >90)
GFR, EST NON AFRICAN AMERICAN: 40 mL/min — AB (ref >90)
Glucose, Bld: 187 mg/dL — ABNORMAL HIGH (ref 70–99)
Potassium: 3.8 mmol/L (ref 3.5–5.1)
Sodium: 138 mmol/L (ref 135–145)

## 2015-01-28 LAB — PULMONARY FUNCTION TEST
DL/VA % pred: 134 %
DL/VA: 6.56 ml/min/mmHg/L
DLCO UNC % PRED: 50 %
DLCO UNC: 19.21 ml/min/mmHg
DLCO cor % pred: 63 %
DLCO cor: 24.13 ml/min/mmHg
FEF 25-75 POST: 1.37 L/s
FEF 25-75 Pre: 1.36 L/sec
FEF2575-%Change-Post: 0 %
FEF2575-%PRED-PRE: 31 %
FEF2575-%Pred-Post: 31 %
FEV1-%Change-Post: 1 %
FEV1-%PRED-POST: 41 %
FEV1-%PRED-PRE: 40 %
FEV1-PRE: 1.7 L
FEV1-Post: 1.72 L
FEV1FVC-%Change-Post: 4 %
FEV1FVC-%Pred-Pre: 86 %
FEV6-%CHANGE-POST: -3 %
FEV6-%Pred-Post: 45 %
FEV6-%Pred-Pre: 47 %
FEV6-Post: 2.29 L
FEV6-Pre: 2.36 L
FEV6FVC-%Change-Post: 0 %
FEV6FVC-%PRED-POST: 101 %
FEV6FVC-%Pred-Pre: 102 %
FVC-%CHANGE-POST: -2 %
FVC-%PRED-PRE: 46 %
FVC-%Pred-Post: 44 %
FVC-PRE: 2.36 L
FVC-Post: 2.29 L
POST FEV6/FVC RATIO: 100 %
Post FEV1/FVC ratio: 75 %
Pre FEV1/FVC ratio: 72 %
Pre FEV6/FVC Ratio: 100 %
RV % pred: 125 %
RV: 2.44 L
TLC % pred: 62 %
TLC: 4.83 L

## 2015-01-28 LAB — GLUCOSE, CAPILLARY
GLUCOSE-CAPILLARY: 181 mg/dL — AB (ref 70–99)
Glucose-Capillary: 66 mg/dL — ABNORMAL LOW (ref 70–99)

## 2015-01-28 MED ORDER — OXYCODONE HCL 5 MG PO TABS: 5.0000 mg | ORAL_TABLET | ORAL | Status: DC | PRN

## 2015-01-28 MED ORDER — ALBUTEROL SULFATE (2.5 MG/3ML) 0.083% IN NEBU
2.5000 mg | INHALATION_SOLUTION | Freq: Once | RESPIRATORY_TRACT | Status: AC
  Administered 2015-01-28: 2.5 mg via RESPIRATORY_TRACT

## 2015-01-28 MED ORDER — SENNOSIDES-DOCUSATE SODIUM 8.6-50 MG PO TABS: 2.0000 | ORAL_TABLET | Freq: Two times a day (BID) | ORAL | Status: DC

## 2015-01-28 MED ORDER — SODIUM CHLORIDE 0.9 % IV SOLN: INTRAVENOUS | Status: DC

## 2015-01-28 MED ORDER — FREESTYLE LANCETS MISC
Status: DC
Start: 2015-01-28 — End: 2015-12-05

## 2015-01-28 MED ORDER — "INSULIN SYRINGE-NEEDLE U-100 28G X 1/2"" 0.5 ML MISC": Status: DC

## 2015-01-28 MED ORDER — SODIUM CHLORIDE 0.9 % IJ SOLN: 10.0000 mL | INTRAMUSCULAR | Status: DC | PRN

## 2015-01-28 MED ORDER — FUROSEMIDE 40 MG PO TABS: 20.0000 mg | ORAL_TABLET | Freq: Every day | ORAL | Status: DC

## 2015-01-28 MED ORDER — PRO-STAT SUGAR FREE PO LIQD: 30.0000 mL | ORAL | Status: DC

## 2015-01-28 MED ORDER — NICOTINE 7 MG/24HR TD PT24: 7.0000 mg | MEDICATED_PATCH | Freq: Every day | TRANSDERMAL | Status: DC

## 2015-01-28 MED ORDER — POLYETHYLENE GLYCOL 3350 17 G PO PACK: 17.0000 g | PACK | Freq: Every day | ORAL | Status: DC

## 2015-01-28 MED ORDER — INSULIN ASPART PROT & ASPART (70-30 MIX) 100 UNIT/ML ~~LOC~~ SUSP: 14.0000 [IU] | Freq: Two times a day (BID) | SUBCUTANEOUS | Status: DC

## 2015-01-28 MED ORDER — METOPROLOL TARTRATE 25 MG PO TABS: 75.0000 mg | ORAL_TABLET | Freq: Two times a day (BID) | ORAL | Status: DC

## 2015-01-28 MED ORDER — GLUCERNA SHAKE PO LIQD: 237.0000 mL | Freq: Two times a day (BID) | ORAL | Status: DC

## 2015-01-28 MED ORDER — SODIUM CHLORIDE 0.9 % IJ SOLN: 10.0000 mL | Freq: Two times a day (BID) | INTRAMUSCULAR | Status: DC

## 2015-01-28 MED ORDER — HYDRALAZINE HCL 25 MG PO TABS: 25.0000 mg | ORAL_TABLET | Freq: Three times a day (TID) | ORAL | Status: DC

## 2015-01-28 NOTE — Care Management Note (Signed)
    Page 1 of 2   01/28/2015     4:20:14 PM CARE MANAGEMENT NOTE 01/28/2015  Patient:  Dwayne [name],Dwayne [name]   Account Number:  [id number]  Date Initiated:  01/17/2015  Documentation initiated by:  Donn PieriniWEBSTER,Shakea Isip  Subjective/Objective Assessment:   Pt admitted with endocarditis     Action/Plan:   PTA pt lived at home with wife   Anticipated DC Date:  01/28/2015   Anticipated DC Plan:  HOME W HOME HEALTH SERVICES      DC Planning Services  CM consult      [facility]   Choice offered to / List presented to:          [facility] arranged  HH[facility] RN      [facility]   Status of service:  Completed, signed off Medicare Important Message given?   (If response is "NO", the following Medicare IM given date fields will be blank) Date Medicare IM given:   Medicare IM given by:   Date Additional Medicare IM given:   Additional Medicare IM given by:    Discharge Disposition:  HOME W HOME HEALTH SERVICES  Per UR Regulation:  Reviewed for med. necessity/level of care/duration of stay  If discussed at Long Length of Stay Meetings, dates discussed:   01/20/2015  01/25/2015  01/27/2015    Comments:  01/28/15- 1400- Donn PieriniKristi Earle Troiano RN, BSN [phone]' Pt for home today with home IV abx HH RN- pt has had PICC line placed this AM- have spoken with pt regarding HH and explained [facility]aware and following for IV abx at home- [facility]u [facility]- made for 02/07/15 at 1200- also reviewed meds with pt's wife (tiffany)- per conversation pt already has vial of insulin at home- and wife states that they should be ok with other meds- [facility]- explained that CM could not assist with pain meds- [facility]  01/27/15- 1100- Donn PieriniKristi Luanne Krzyzanowski RN, BSN [phone] [facility]c with IV abx for home-  with f/u in am regarding finial plan.   01/17/15 1700- Donn PieriniKristi Mylena Sedberry RN, BSN [phone] pt tx to 2w06 today- NCM to follow- Infectious disease Dr. Algis LimingVanDam recommends 8 weeks of antibiotics post heart surgery and all other potential surgeries. awaiting dental extractions-  - seen by Dr. Barry Dieneswens with CT surgery-- He  would prefer the bacteremia to clear up first, however, he is concerned about the degree his MR worsened in 2 days between TTE and TEE. May need earlier surgery if  he starts to have HF symptom.  [facility] needs.

## 2015-01-28 NOTE — Telephone Encounter (Signed)
LMTCB in reference to tcm call and appt with Ronie Spiesayna Dunn PA for 02-04-15 0915.

## 2015-01-28 NOTE — Progress Notes (Signed)
  Outpatient Parenteral Antimicrobial Therapy (OPAT) Pharmacy Monitoring  Guidelines developed by the Infectious Diseases Society of America (IDSA) recommend that hospitals who send patients home on intravenous (IV) antibiotics have an active performance improvement program.  IDSA guidelines also recommend that an infection specialist, such as a pharmacist, be involved in the evaluation of candidates for discharge with OPAT.      Yes No Comments  Assessed patient for appropriateness of OPAT? [x]  []  Is IV antimicrobial therapy needed? Is the home environment safe and adequate to support care? Are the patient/caregivers willing to participate and able to safely, effectively, and reliably deliver parenteral antimicrobial therapy? Is communication about problems and monitoring of therapy in place with patient? Does the patient/caregiver understand the benefits/risks with OPAT?  Are oral antibiotics an option? []  [x]  Patients diagnosed with endocarditis are not candidates for oral antibiotics.  Is antibiotic selected the best option for treatment indication? [x]  []  Can therapy be de-escalated further?  Is the dose appropriate? []  [x]  Consider renal function and administration schedule for outpatient setting.  Is the length of therapy appropriate? [x]  []  Consider disease-specific guidelines when available.  Recommend changing antibiotic regimen? [x]  []  If yes, recommend ampicillin 12 gm over 24 hours (continuous infusion).    Final antibiotic regimen for discharge is ampicillin 12 gm over 24 hours (continuous infusion) for a total of 6 weeks (using 01/13/15 as day 1) (include drug, dose, route, frequency and duration).  Laboratory monitoring and follow-up for discharge with OPAT is follow-up with ID in the [facility] within 4 weeks of discharge. Also recommend obtaining a BMET once weekly to assess renal function.   Thank you for allowing pharmacy to be a part of this patient's care team.  Cassie L.  Roseanne RenoStewart, PharmD Clinical Pharmacy Resident Pager: [phone] 01/28/2015 1:50 PM

## 2015-01-28 NOTE — Progress Notes (Signed)
[facility] for Infectious Disease  Day #20abtx, Day #11 ampicillin  Subjective: Still having sligh jaw swelling from teeth extraction, he still reports intermittent right shoulder pain  Antibiotics:  Anti-infectives    Start     Dose/Rate Route Frequency Ordered Stop   01/19/15 2000  ampicillin (OMNIPEN) 2 g in sodium chloride 0.9 % 50 mL IVPB     2 g150 mL/hr over 20 Minutes Intravenous 6 times per day 01/19/15 1121     01/18/15 2000  penicillin G potassium 12 Million Units in dextrose 5 % 500 mL continuous infusion     12 Million Units41.7 mL/hr over 12 Hours Intravenous Every 12 hours 01/18/15 1432 01/19/15 2050   01/15/15 1600  nafcillin 2 g in dextrose 5 % 50 mL IVPB  Status:  Discontinued     2 g100 mL/hr over 30 Minutes Intravenous 6 times per day 01/15/15 1503 01/15/15 1517   01/15/15 1600  nafcillin 2 g in dextrose 5 % 50 mL IVPB     2 g100 mL/hr over 30 Minutes Intravenous 6 times per day 01/15/15 1517 01/18/15 1753   01/15/15 0930  nafcillin injection 2 g  Status:  Discontinued     2 g Intravenous Every 4 hours 01/15/15 0921 01/15/15 1503   01/14/15 1500  DAPTOmycin (CUBICIN) 600 mg in sodium chloride 0.9 % IVPB  Status:  Discontinued     600 mg224 mL/hr over 30 Minutes Intravenous Every 48 hours 01/12/15 1421 01/13/15 1239   01/13/15 1500  ceFAZolin (ANCEF) IVPB 2 g/50 mL premix  Status:  Discontinued     2 g100 mL/hr over 30 Minutes Intravenous 3 times per day 01/13/15 1407 01/15/15 0921   01/12/15 1600  ceFAZolin (ANCEF) IVPB 2 g/50 mL premix  Status:  Discontinued     2 g100 mL/hr over 30 Minutes Intravenous Every 12 hours 01/12/15 1419 01/13/15 1407   01/11/15 1200  DAPTOmycin (CUBICIN) 600 mg in sodium chloride 0.9 % IVPB  Status:  Discontinued     600 mg224 mL/hr over 30 Minutes Intravenous Every 24 hours 01/11/15 1003 01/12/15 1421   01/10/15 1200  vancomycin (VANCOCIN) IVPB 1000 mg/200 mL premix  Status:  Discontinued     1,000 mg200 mL/hr over 60 Minutes  Intravenous Every 8 hours 01/10/15 1044 01/11/15 0947   01/10/15 1100  piperacillin-tazobactam (ZOSYN) IVPB 3.375 g  Status:  Discontinued     3.375 g12.5 mL/hr over 240 Minutes Intravenous Every 8 hours 01/10/15 1043 01/11/15 0902   01/09/15 0700  metroNIDAZOLE (FLAGYL) IVPB 500 mg  Status:  Discontinued     500 mg100 mL/hr over 60 Minutes Intravenous Every 8 hours 01/09/15 0651 01/10/15 1018      Medications: Scheduled Meds: . ampicillin (OMNIPEN) IV  2 g Intravenous 6 times per day  . chlorhexidine  15 mL Mouth/Throat BID  . feeding supplement (GLUCERNA SHAKE)  237 mL Oral BID BM  . feeding supplement (PRO-STAT SUGAR FREE 64)  30 mL Oral Q24H  . furosemide  40 mg Oral Daily  . hydrALAZINE  25 mg Oral 3 times per day  . Influenza vac split quadrivalent PF  0.5 mL Intramuscular Tomorrow-1000  . insulin aspart  0-15 Units Subcutaneous TID WC  . insulin aspart  0-5 Units Subcutaneous QHS  . insulin aspart protamine- aspart  14 Units Subcutaneous BID WC  . living well with diabetes book   Does not apply Once  . metoprolol tartrate  75 mg Oral BID  .  neomycin-bacitracin-polymyxin   Topical Daily  . nicotine  7 mg Transdermal Daily  . polyethylene glycol  17 g Oral Daily  . senna-docusate  2 tablet Oral BID    Objective: Weight change: -1 lb 8.7 oz (-0.7 kg)  Intake/Output Summary (Last 24 hours) at 01/28/15 9628 Last data filed at 01/28/15 0528  Gross per 24 hour  Intake   1040 ml  Output   3301 ml  Net  -2261 ml   Blood pressure 160/94, pulse 78, temperature 98.1 F (36.7 C), temperature source Oral, resp. rate 18, height 6' 2"  (1.88 m), weight 242 lb 8.1 oz (110 kg), SpO2 99 %. Temp:  [98 F (36.7 C)-98.4 F (36.9 C)] 98.1 F (36.7 C) (01/29 0444) Pulse Rate:  [78-84] 78 (01/29 0444) Resp:  [18] 18 (01/29 0444) BP: (144-177)/(78-103) 160/94 mmHg (01/29 0444) SpO2:  [99 %-100 %] 99 % (01/29 0444) Weight:  [242 lb 8.1 oz (110 kg)] 242 lb 8.1 oz (110 kg) (01/29  0444)  Physical Exam: BP 160/94 mmHg  Pulse 78  Temp(Src) 98.1 F (36.7 C) (Oral)  Resp 18  Ht 6' 2"  (1.88 m)  Wt 242 lb 8.1 oz (110 kg)  BMI 31.12 kg/m2  SpO2 99%  Physical Exam  Constitutional: He is oriented x 3, sitting on edge of bed. Slow to answer questionsNo distress.  HENT:  Mouth/Throat: swollen gum, mild swelling to left side jaw Cardiovascular: Normal rate, regular rhythm and normal heart sounds. Nl s1, s2, 1/2 HSM at apex Pulmonary/Chest: Effort normal and breath sounds normal. No respiratory distress. He has no wheezes.  Abdominal: Soft. Bowel sounds are normal. He exhibits no distension. There is no tenderness.  Lymphadenopathy:  He has no cervical adenopathy.  Skin: Skin is warm and dry. No rash noted. No erythema.     CBC:   CBC Latest Ref Rng 01/28/2015 01/27/2015 01/26/2015  WBC 4.0 - 10.5 K/uL 6.9 7.1 10.6(H)  Hemoglobin 13.0 - 17.0 g/dL 9.0(L) 9.1(L) 9.2(L)  Hematocrit 39.0 - 52.0 % 28.2(L) 28.0(L) 28.2(L)  Platelets 150 - 400 K/uL 338 378 430(H)     BMET  Recent Labs  01/27/15 0555 01/28/15 0451  NA 136 138  K 3.3* 3.8  CL 101 105  CO2 29 29  GLUCOSE 136* 187*  BUN 18 13  CREATININE 2.24* 2.07*  CALCIUM 8.4 8.3*     Liver Panel  No results for input(s): PROT, ALBUMIN, AST, ALT, ALKPHOS, BILITOT, BILIDIR, IBILI in the last 72 hours.  Micro Results: Recent Results (from the past 720 hour(s))  Urine culture     Status: None   Collection Time: 01/09/15  1:31 AM  Result Value Ref Range Status   Specimen Description URINE, CLEAN CATCH  Final   Special Requests NONE  Final   Colony Count   Final    3,000 COLONIES/ML Performed at [facility]    Culture   Final    INSIGNIFICANT GROWTH Performed at [facility]    Report Status 01/10/2015 FINAL  Final  Stool culture     Status: None   Collection Time: 01/09/15  4:09 AM  Result Value Ref Range Status   Specimen Description VAGINAL/RECTAL  Final   Special Requests  Normal  Final   Culture   Final    NO SALMONELLA, SHIGELLA, CAMPYLOBACTER, YERSINIA, OR E.COLI 0157:H7 ISOLATED Performed at [facility]    Report Status 01/12/2015 FINAL  Final  Clostridium Difficile by PCR     Status:  None   Collection Time: 01/09/15  4:09 AM  Result Value Ref Range Status   C difficile by pcr NEGATIVE NEGATIVE Final  Culture, blood (routine x 2)     Status: None   Collection Time: 01/10/15  4:35 AM  Result Value Ref Range Status   Specimen Description BLOOD RIGHT HAND  Final   Special Requests BOTTLES DRAWN AEROBIC AND ANAEROBIC 10CC EA  Final   Culture   Final    STAPHYLOCOCCUS AUREUS Note: SUSCEPTIBILITIES PERFORMED ON PREVIOUS CULTURE WITHIN THE LAST 5 DAYS. Note: Culture results may be compromised due to an excessive volume of blood received in culture bottles. Gram Stain Report Called to,Read Back By and Verified With:  MONICA YIM ON 01/10/2015 AT 11:36P BY WILEJ Performed at [facility]    Report Status 01/13/2015 FINAL  Final  Culture, blood (routine x 2)     Status: None   Collection Time: 01/10/15  4:46 AM  Result Value Ref Range Status   Specimen Description BLOOD LEFT ARM  Final   Special Requests BOTTLES DRAWN AEROBIC AND ANAEROBIC 10CC EA  Final   Culture   Final    STAPHYLOCOCCUS AUREUS Note: SUSCEPTIBILITIES PERFORMED ON PREVIOUS CULTURE WITHIN THE LAST 5 DAYS. Note: Culture results may be compromised due to an excessive volume of blood received in culture bottles. Gram Stain Report Called to,Read Back By and Verified With: MONICA YIM ON 01/10/2015 AT 11:36P BY WILEJ Performed at [facility]    Report Status 01/13/2015 FINAL  Final  Respiratory virus panel (routine influenza)     Status: None   Collection Time: 01/10/15 10:34 AM  Result Value Ref Range Status   Source - RVPAN NASAL SWAB  Corrected    Comment: CORRECTED ON 01/12 AT 2036: PREVIOUSLY REPORTED AS NASAL SWAB   Respiratory Syncytial Virus A NOT DETECTED   Final   Respiratory Syncytial Virus B NOT DETECTED  Final   Influenza A NOT DETECTED  Final   Influenza B NOT DETECTED  Final   Parainfluenza 1 NOT DETECTED  Final   Parainfluenza 2 NOT DETECTED  Final   Parainfluenza 3 NOT DETECTED  Final   Metapneumovirus NOT DETECTED  Final   Rhinovirus NOT DETECTED  Final   Adenovirus NOT DETECTED  Final   Influenza A H1 NOT DETECTED  Final   Influenza A H3 NOT DETECTED  Final    Comment: (NOTE)       Normal Reference Range for each Analyte: NOT DETECTED Testing performed using the Luminex xTAG Respiratory Viral Panel test kit. The analytical performance characteristics of this assay have been determined by [facility].  The modifications have not been cleared or approved by the FDA. This assay has been validated pursuant to the CLIA regulations and is used for clinical purposes. Performed at [facility], blood (routine x 2)     Status: None   Collection Time: 01/10/15 11:19 AM  Result Value Ref Range Status   Specimen Description BLOOD LEFT FOREARM  Final   Special Requests BOTTLES DRAWN AEROBIC ONLY 5CC  Final   Culture   Final    STAPHYLOCOCCUS AUREUS Note: RIFAMPIN AND GENTAMICIN SHOULD NOT BE USED AS SINGLE DRUGS FOR TREATMENT OF STAPH INFECTIONS. Note: Gram Stain Report Called to,Read Back By and Verified With: NURSE MONICA YIM 01/11/15 6:05AM THOMI Performed at [facility]    Report Status 01/13/2015 FINAL  Final   Organism ID, Bacteria STAPHYLOCOCCUS AUREUS  Final      Susceptibility   Staphylococcus aureus - MIC*    CLINDAMYCIN <=0.25 SENSITIVE Sensitive     ERYTHROMYCIN <=0.25 SENSITIVE Sensitive     GENTAMICIN <=0.5 SENSITIVE Sensitive     LEVOFLOXACIN 4 INTERMEDIATE Intermediate     OXACILLIN 0.5 SENSITIVE Sensitive     PENICILLIN 0.12 SENSITIVE Sensitive     RIFAMPIN 2 INTERMEDIATE Intermediate     TRIMETH/SULFA <=10 SENSITIVE Sensitive     VANCOMYCIN 1 SENSITIVE Sensitive      TETRACYCLINE <=1 SENSITIVE Sensitive     MOXIFLOXACIN 2 RESISTANT Resistant     * STAPHYLOCOCCUS AUREUS  Culture, blood (routine x 2)     Status: None   Collection Time: 01/10/15 11:27 AM  Result Value Ref Range Status   Specimen Description BLOOD LEFT ANTECUBITAL  Final   Special Requests BOTTLES DRAWN AEROBIC ONLY [place]  Final   Culture   Final    STAPHYLOCOCCUS AUREUS Note: SUSCEPTIBILITIES PERFORMED ON PREVIOUS CULTURE WITHIN THE LAST 5 DAYS. Note: Gram Stain Report Called to,Read Back By and Verified With: NURSE MONICA YIM 01/11/15 6:05AM THOMI Performed at [facility]    Report Status 01/13/2015 FINAL  Final  Culture, blood (routine x 2)     Status: None   Collection Time: 01/13/15  7:20 PM  Result Value Ref Range Status   Specimen Description BLOOD RIGHT ARM  Final   Special Requests BOTTLES DRAWN AEROBIC AND ANAEROBIC 5CC  Final   Culture   Final    NO GROWTH 5 DAYS Performed at [facility]    Report Status 01/20/2015 FINAL  Final  Culture, blood (routine x 2)     Status: None   Collection Time: 01/13/15  7:20 PM  Result Value Ref Range Status   Specimen Description BLOOD RIGHT ARM  Final   Special Requests BOTTLES DRAWN AEROBIC AND ANAEROBIC 3CC  Final   Culture   Final    NO GROWTH 5 DAYS Performed at [facility]    Report Status 01/20/2015 FINAL  Final  Body fluid culture     Status: None   Collection Time: 01/14/15  9:02 AM  Result Value Ref Range Status   Specimen Description FLUID RIGHT SHOULDER SYNOVIAL  Final   Special Requests NONE  Final   Gram Stain   Final    FEW WBC PRESENT,BOTH PMN AND MONONUCLEAR NO ORGANISMS SEEN Performed at [facility]    Culture   Final    NO GROWTH 3 DAYS Performed at [facility]    Report Status 01/17/2015 FINAL  Final  Culture, blood (routine x 2)     Status: None   Collection Time: 01/18/15  6:10 PM  Result Value Ref Range Status   Specimen Description BLOOD LEFT ARM  Final    Special Requests BOTTLES DRAWN AEROBIC AND ANAEROBIC 10CC  Final   Culture   Final    NO GROWTH 5 DAYS Performed at [facility]    Report Status 01/25/2015 FINAL  Final  Culture, blood (routine x 2)     Status: None   Collection Time: 01/18/15  6:18 PM  Result Value Ref Range Status   Specimen Description BLOOD RIGHT ARM  Final   Special Requests BOTTLES DRAWN AEROBIC AND ANAEROBIC 10CC  Final   Culture   Final    NO GROWTH 5 DAYS Performed at [facility]    Report Status 01/25/2015 FINAL  Final  Surgical pcr screen  Status: None   Collection Time: 01/25/15  4:47 AM  Result Value Ref Range Status   MRSA, PCR NEGATIVE NEGATIVE Final   Staphylococcus aureus NEGATIVE NEGATIVE Final    Comment:        The Xpert SA Assay (FDA approved for NASAL specimens in patients over 28 years of age), is one component of a comprehensive surveillance program.  Test performance has been validated by [facility] for patients greater than or equal to 56 year old. It is not intended to diagnose infection nor to guide or monitor treatment.      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 Problems:   Tobacco abuse   Noncompliance   Dehydration   Diarrhea   Diabetes type 2, uncontrolled   Hyperglycemia   Uncontrolled hypertension   Diabetic foot ulcers   Pain   Diabetic ulcer of left foot associated with type 2 diabetes mellitus   Diabetic ulcer of right foot associated with type 2 diabetes mellitus   Osteomyelitis   Shoulder pain   Thigh pain, musculoskeletal   Septic shock   Staphylococcus aureus bacteremia with sepsis   Smoker   Elevated troponin   Acute renal failure   Bacteremia   Septic joint of right shoulder region   Infective myositis of left thigh   Purulent pericarditis   Mitral regurgitation   Endocarditis   Back pain   Poor dentition   Discitis of thoracic region   Congestive heart failure    Leukocytosis   Hypertensive heart disease   Periodontal disease   PVD (peripheral vascular disease)   Septic embolism    Dwayne [name] is a 35 y.o. male with MSSA bacteremia (penicillin sensitive/PCN 0.12) and  MV endocarditis c/b ANEURYSM, MV perforation, SEVERE MR,  as well as CNS septic emboli,  Right septic shoulder, thoracic spine diskitis c/b acute on ckd.  MSSA endocarditis/bacteremia=  -bacteremia cleared on 1/14, currently on ampicilin (since PCN sensitive), initially had on nafcillin but changed due to aki. Ampicillin would also have good penetrations into cns.  - Will need 6 wk of IV antibiotics, using 1/14 as day 1 of 42. Likely restart the abtx clock at time of surgery as well.  - Would send tissue to pcr testing. - For outpatient, can have home health arrange for continuous infusion of ampicilin so that he only changes the picc line once a day - he will need weekly cbc and bmp as an outpatient - will set up follow up with in ID clinic in 2-3 wk  - will also need to have follow up with cardiology and CT surgery for scheduling of valve replacement; if shoulder has worsening pain would recommend ortho follow up as well.  Right shoulder septic arthritis = as discussed with Dr. Ree Kida. IR attempted repeat aspiration but no fluid found thus, continue with medical management.  - leukocytosis = resolved  - poor dentition =Continue with pain management  -acute on chronic kidney disease = staying in the 2.0 range for now. Will continue to monitor with outpatient labs   LOS: 19 days   Dwayne [name] 01/28/2015, 8:21 AM

## 2015-01-28 NOTE — Plan of Care (Signed)
Problem: Discharge Progression Outcomes Goal: Hemodynamically stable Outcome: Progressing BP still elevated at times

## 2015-01-28 NOTE — Progress Notes (Addendum)
VASCULAR LAB PRELIMINARY  PRELIMINARY  PRELIMINARY  PRELIMINARY  Pre-op Cardiac Surgery  Carotid Findings:  Bilateral:  1-39% ICA stenosis.  Vertebral artery flow is antegrade.      Upper Extremity Right Left  Brachial Pressures 170 N/A- IV location  Radial Waveforms Tri Tri  Ulnar Waveforms Tri Tri  Palmar Arch (Allen's Test) Obliterates with radial compression, decreases >50% with ulnar compression Obliterates with radial compression, normal with ulnar compression       Dwayne [name], RDMS, RVT  Dwayne [name], Dwayne [name], RVT 01/28/2015, 2:02 PM

## 2015-01-28 NOTE — Progress Notes (Signed)
Peripherally Inserted Central Catheter/Midline Placement  The IV Nurse has discussed with the patient and/or persons authorized to consent for the patient, the purpose of this procedure and the potential benefits and risks involved with this procedure.  The benefits include less needle sticks, lab draws from the catheter and patient may be discharged home with the catheter.  Risks include, but not limited to, infection, bleeding, blood clot (thrombus formation), and puncture of an artery; nerve damage and irregular heat beat.  Alternatives to this procedure were also discussed.  PICC/Midline Placement Documentation  PICC / Midline Single Lumen 01/28/15 PICC Left Basilic 49 cm 0 cm (Active)  Indication for Insertion or Continuance of Line Home intravenous therapies (PICC only) 01/28/2015 10:41 AM  Exposed Catheter (cm) 0 cm 01/28/2015 10:41 AM  Line Status Flushed;Saline locked;Blood return noted 01/28/2015 10:41 AM  Dressing Change Due 02/04/15 01/28/2015 10:41 AM       Ethelda Chickurrie, Marquell Saenz Robert 01/28/2015, 10:43 AM

## 2015-01-28 NOTE — Discharge Summary (Signed)
Physician Discharge Summary  Dwayne [name] ZOX:[id number] DOB: May 01, 1980 DOA: 01/09/2015  PCP: No PCP Per Patient  Admit date: 01/09/2015 Discharge date: 01/28/2015  Time spent: 45 minutes  Recommendations for Outpatient Follow-up:  Patient will be discharged with home health nursing.  He will need to continue his medications as prescribed.  Patient will need to follow-up with cardiology at the specified time, his primary care physician within one week of discharge, cardiothoracic surgery within 4 weeks of discharge, infectious disease within 4 weeks of discharge, and general dentistry. Patient to continue full liquid diet possibly advance to soft foods given his recent dental surgery.   Discharge Diagnoses:  Mitral valve endocarditis/MSSA bacteremia/septic emboli Elevated troponin Dental caries Acute diastolic heart failure Acute renal insufficiency/CKD, stage III Pericarditis with pericardial effusion PVD Normocytic anemia Right shoulder septic arthritis/osteomyelitis/myositis Diabetes mellitus Diarrhea Hypertension  Discharge Condition: Stable  Diet recommendation: Full liquid  Filed Weights   01/26/15 0330 01/27/15 0446 01/28/15 0444  Weight: 112.7 kg (248 lb 7.3 oz) 110.7 kg (244 lb 0.8 oz) 110 kg (242 lb 8.1 oz)    History of present illness:  on 01/09/2015 by Dr. Della Goo Dwayne [name] is a 35 y.o. male with a history of Uncontrolled HTN, DM2 who presents to the ED with complaints of diarrhea and fevers and chills x 24 hours. He reports having weakness and fatigue and He denies any nausea or vomiting. No one is sick at home, and he denies taking any recent antibiotic therapy. He reports that he has been out of his medications for months, and has not seen his PCP in months as well. 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  Interim history He was found to have MSSA bacteremia  and came in septic shock. He underwent TEE showing MV endocarditis. CTVs consulted and plan for surgery in the next week.He underwent cardiac cath on 1/25 showing normal coronary angiography. Dental medicine consulted and planned for extraction of infected teeth today on 1/26. He will be scheduled for mitral valve surgery as per cardio thoracic surgery.   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 and severe mitral insufficiency -Cardiothoracic surgery recommended six-week course of antibiotics before proceeding with mitral valve repair or replacement however due to septic arthritis and myositis, with deterioration, patient may warrant intervention sooner rather than later -Left heart cath on 01/24/2015 showed normal coronaries, moderate pulmonary hypertension with elevated LV filling pressures with large V wave and severe MR -Patient underwent dental extraction of 20 teeth on 01/25/2015 -Cardiology/Infectious disease consulted and appreciated -Cardiothoracic surgery recommends continuing medical therapy with anticipation of MV surgery at some point.  -Spoke with ID (1/28)-Dr. Drue Second. Will ask IR to joint aspiration to follow cell count and culture -Spoke with Dr. Eppie Gibson (IR), there was not enough fluid in the right shoulder for aspiration (possible improvement with medical therapy) -Patient has been afebrile and has not leukocytosis for the past several days -Will continue Ampicillin for a total of 6 weeks (from 01/13/2015 until 02/24/2015)- continue as a continuous infusion 12g per day (will be assisted by home health) -Patient will need to follow up with cardiology, cardiothoracic surgery, ID   Elevated troponin -Likely secondary to demand ischemia -Heart catheterization showed normal coronary  arteries -Cardiology has signed off (1/28) and will  follow with patient as an outpatient  Dental Caries -Removed by denistry -Spoke with Dr. Kristin BruinsKulinski who states the sutures will dissolve on their own -Patient will need to followup with DDS as an outpatient  Acute diastolic heart failure -Complicated by valvular disease and transfusion of 2 units packed red blood cells -Cardiology consultation appreciated -Patient was started on IV Lasix,- has good UOP 1950 over the past 24 hours -Continue to monitor intake and output as well as daily weights  Acute renal insufficiency/ ? 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, no need for aggressive workup  Normocytic Anemia  -Patient was given 2 units packed red blood cells 01/25/2015 -Anemia panel showed low iron but high ferritin (possibly acute phase reactant)   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, myoscites -Treatment plan as above -Patient had fluoroguided aspiration, cultures show no growth, however elevated cell count -Patient seen by orthopedic surgery, no surgical intervention for now  Diabetes mellitus -Poorly controlled -Hemoglobin A1c 8.1 -Patient admits to being noncompliant medications -Diabetes coordinator consulted and appreciated -Was initially placed on sliding scale CBG monitoring and 70/30 -Will discharge patient with Novolog 70/30 14 units BID  Diarrhea -Cdiff PCR  negative  Hypertension -Continue lopressor and hydralazine PRN  Procedures: Echocardiogram (TTE) TEE ABI Lower ext doppler Right heart Cath, LV angiography Dental extraction Fluoroguided aspiration of shoulder Insertion of PICC  Consultations: Cardiology Infectious Disease Dentistry Nephrology Orthopedic surgery Cardiothoracic surgery Vascular surgery Interventional radiology  Discharge Exam: Filed Vitals:   01/28/15 0444  BP: 160/94  Pulse: 78  Temp: 98.1 F (36.7 C)  Resp: 18   Exam  General: Well developed, well nourished, NAD, appears stated age  HEENT: NCAT, mucous membranes moist.   Cardiovascular: S1 S2 auscultated, 2/6 SEM, Regular rate and rhythm.  Respiratory: Clear, however, patient does not inhale deeply  Abdomen: Soft, nontender, nondistended, + bowel sounds  Extremities: warm dry without cyanosis clubbing. + pitting edema in LE B/L  Neuro: AAOx3, nonfocal  Psych: Flat affect  Discharge Instructions      Discharge Instructions    Discharge instructions    Complete by:  As directed   Patient will be discharged with home health nursing.  He will need to continue his medications as prescribed.  Patient will need to follow-up with cardiology at the specified time, his primary care physician within one week of discharge, cardiothoracic surgery within 4 weeks of discharge, infectious disease within 4 weeks of discharge, and general dentistry. Patient to continue full liquid diet possibly advance to soft foods given his recent dental surgery.            Medication List    STOP taking these medications        ciprofloxacin 500 MG tablet  Commonly known as:  CIPRO     doxycycline 100 MG tablet  Commonly known as:  VIBRA-TABS     lisinopril 20 MG tablet  Commonly known as:  PRINIVIL,ZESTRIL     metFORMIN 500 MG tablet  Commonly known as:  GLUCOPHAGE  TAKE these medications        ampicillin in sodium chloride 0.9 % 50 mL  12g  continuous infusion via CAP pump with minimal fluid volume     feeding supplement (GLUCERNA SHAKE) Liqd  Take 237 mLs by mouth 2 (two) times daily between meals.     feeding supplement (PRO-STAT SUGAR FREE 64) Liqd  Take 30 mLs by mouth daily.     freestyle lancets  Any generic available. Use as instructed     furosemide 40 MG tablet  Commonly known as:  LASIX  Take 0.5 tablets (20 mg total) by mouth daily.     hydrALAZINE 25 MG tablet  Commonly known as:  APRESOLINE  Take 1 tablet (25 mg total) by mouth every 8 (eight) hours.     insulin aspart protamine- aspart (70-30) 100 UNIT/ML injection  Commonly known as:  NOVOLOG MIX 70/30  Inject 0.14 mLs (14 Units total) into the skin 2 (two) times daily with a meal.     Insulin Syringe-Needle U-100 28G X 1/2" 0.5 ML Misc  Use as directed. Any generic available.     metoprolol tartrate 25 MG tablet  Commonly known as:  LOPRESSOR  Take 3 tablets (75 mg total) by mouth 2 (two) times daily.     nicotine 7 mg/24hr patch  Commonly known as:  NICODERM CQ - dosed in mg/24 hr  Place 1 patch (7 mg total) onto the skin daily.     oxyCODONE 5 MG immediate release tablet  Commonly known as:  Oxy IR/ROXICODONE  Take 1 tablet (5 mg total) by mouth every 4 (four) hours as needed for moderate pain or severe pain.     polyethylene glycol packet  Commonly known as:  MIRALAX / GLYCOLAX  Take 17 g by mouth daily.     senna-docusate 8.6-50 MG per tablet  Commonly known as:  Senokot-S  Take 2 tablets by mouth 2 (two) times daily.       No Known Allergies Follow-up Information    Follow up with Ronie Spies, PA-C.   Specialty:  Cardiology   Why:  [facility] - 02/04/15 at 9:45am   Contact information:   [address] [place] [address] [phone]       Follow up with Purcell Nails, MD. Schedule an appointment as soon as possible for a visit in 4 weeks.   Specialty:  Cardiothoracic Surgery   Why:  Hospital  followup   Contact information:   [address] [place] [address] [phone]       Follow up with Charlynne Pander, DDS. Schedule an appointment as soon as possible for a visit in 1 week.   Specialty:  Dentistry   Why:  Hospital followup   Contact information:   [address] [place] [address] [phone]       Follow up with Judyann Munson, MD. Schedule an appointment as soon as possible for a visit in 2 weeks.   Specialty:  Infectious Diseases   Why:  Hospital followup   Contact information:   [address] [place] [address] [phone]        The results of significant diagnostics from this hospitalization (including imaging, microbiology, ancillary and laboratory) are listed below for reference.    Significant Diagnostic Studies: Dg Orthopantogram  01/14/2015   CLINICAL DATA:  Staphylococcal endocarditis.  Poor dentition.  EXAM: ORTHOPANTOGRAM/PANORAMIC  COMPARISON:  None.  FINDINGS: Extensive dental disease  with caries involving multiple teeth, including numbers 7, 8, 9, 10, 11, 12, 13, 14, 15, 16, 19, 20, 29, 30, 31 and 32. Periapical lucencies are present adjacent to teeth numbers 29 and 30.  IMPRESSION: Extensive dental disease as described.   Electronically Signed   By: Hulan Saas M.D.   On: 01/14/2015 21:31   Dg Chest 1 View  01/22/2015   CLINICAL DATA:  CHF, increasing right-sided chest pain  EXAM: CHEST - 1 VIEW  COMPARISON:  01/14/2015  FINDINGS: Cardiac shadow is mildly enlarged. Bibasilar atelectatic changes and effusions are seen slightly increased from the prior exam. No other focal abnormality is noted.  IMPRESSION: Slight increase in bibasilar changes.   Electronically Signed   By: Alcide Clever M.D.   On: 01/22/2015 09:57   Dg Chest 2 View  01/14/2015   CLINICAL DATA:  Right shoulder pain for 6 days.  EXAM: CHEST  2 VIEW  COMPARISON:  01/10/2015  FINDINGS: Two views of the chest demonstrate low lung  volumes with bibasilar chest densities, left side greater than right. In addition, the heart appears to be enlarged. The configuration of the heart raises the possibility for pericardial fluid. No evidence for pulmonary edema. Difficult to exclude small effusions, particularly on the left side.  IMPRESSION: Low lung volumes with bibasilar densities, left side greater than right. Basilar densities could represent a combination of atelectasis and pleural fluid.  Heart appears enlarged which may be accentuated by low lung volumes. Pericardial fluid cannot be excluded.   Electronically Signed   By: Richarda Overlie M.D.   On: 01/14/2015 08:17   Dg Shoulder Right  01/09/2015   CLINICAL DATA:  Anterior right shoulder pain  EXAM: RIGHT SHOULDER - 2+ VIEW  COMPARISON:  None.  FINDINGS: No fracture or dislocation is seen.  The joint spaces are preserved.  The visualized soft tissues are unremarkable.  Visualized right lung is clear.  IMPRESSION: No fracture or dislocation is seen.   Electronically Signed   By: Charline Bills M.D.   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  EXAM: MRI HEAD WITHOUT CONTRAST  TECHNIQUE: Multiplanar, multiecho pulse sequences of the brain and surrounding structures were obtained without intravenous contrast.  COMPARISON:  Prior study from 04/25/2012  FINDINGS: Cerebral volume within normal limits for patient age. No significant chronic small vessel ischemic changes identified. No mass lesion, midline shift, or mass effect. Ventricles are normal in size without evidence of hydrocephalus. No extra-axial fluid collection.  There are multiple subcentimeter ischemic infarcts involving predominantly the supratentorial brain. These involve both frontal lobes, the right parietal lobe, left temporal lobe, ischemic infarct within the right is cerebral hemisphere is located in the anterior right  frontal lobe and measures 5 mm (series 3, image 36). The largest infarct in the left cerebral hemisphere is located in the deep white matter of the anterior left frontal lobe and measures 13 mm (series 3, image 30). There are infarcts involving the bilateral basal ganglia, the most prominent of which located in the right thalamus which measures 4 mm. There is a single infratentorial infarct within the left cerebellar hemisphere measuring 4 mm. These are most likely embolic in nature given the history of endocarditis and multiple vascular distributions. Scattered susceptibility artifact seen associated with a few of these infarcts, likely reflecting small petechial hemorrhages. No frank hemorrhagic conversion There is associated T2/FLAIR  signal intensity without significant mass effect.  Craniocervical junction within normal limits. Pituitary gland normal.  No acute abnormality seen about the orbits.  Paranasal sinuses are clear.  No mastoid effusion.  Bone marrow signal intensity within normal limits. Visualized upper cervical spine unremarkable.  Scalp soft tissues within normal limits.  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 These are likely embolic in nature given the history of endocarditis and involvement of multiple vascular distributions. No significant mass effect. There are likely scattered small petechial hemorrhages with a number these infarcts without frank hemorrhage.   Electronically Signed   By: Rise Mu M.D.   On: 01/14/2015 21:24   Mr Thoracic Spine Wo Contrast  01/15/2015   ADDENDUM REPORT: 01/15/2015 14:40  ADDENDUM: Study discussed by telephone with Dr. Paulette Blanch DAM on 01/15/2015 at 1435 hrs.   Electronically Signed   By: Augusto [name] M.D.   On: 01/15/2015 14:40   01/15/2015   CLINICAL DATA:  35 year old male with sepsis and back pain. Into card itis, purulent pericarditis, multiple small embolic infarcts.  Initial encounter.  EXAM: MRI THORACIC AND LUMBAR SPINE WITHOUT CONTRAST  TECHNIQUE: Multiplanar and multiecho pulse sequences of the thoracic and lumbar spine were obtained without intravenous contrast.  COMPARISON:  Chest radiographs 01/14/2015. CT Abdomen and Pelvis 05/18/2013.  FINDINGS: MR THORACIC SPINE FINDINGS  Limited sagittal imaging of the cervical spine is unremarkable.  Preserved thoracic vertebral height and alignment, preserved thoracic vertebral height and alignment.  Throughout much of the T3 and T4 vertebral bodies there is heterogeneous T2 signal as well as patchy pre STIR signal. The STIR images were repeated, an the same patchy increased signal persisted at T3 and T4.  No definite signal abnormality in the intervening disc, or adjacent disc spaces; there is widespread intervertebral disc T2 hyperintensity, as a normal finding seen in young patients.  No definite paraspinal or epidural inflammation at those levels.  No suspicious marrow signal elsewhere; intrinsic T1 and T2 hyperintensity at T6 is a benign vertebral body hemangioma.  However, there are layering pleural effusions left greater than right. Negative visualized upper abdominal viscera.  Epidural lipomatosis throughout the thoracic spine and effaces CSF from the thecal sac from the T5 to the T9 level. No definite spinal cord signal abnormality.  MR LUMBAR SPINE FINDINGS  In the lumbar spine T1-T2 and stir marrow signal appears diminished. This is also true in the visible pelvis. Possible associated decreased T2 signal in both the liver and spleen.  Otherwise negative visualized abdominal viscera.  There is mildly increased STIR signal in the lower lumbar erector spinae muscles bilaterally, at L4-L5 (series 17, images 9 and 1). Subcutaneous edema incidentally noted.  Normal lumbar vertebral height and alignment and no marrow edema or acute osseous abnormality in the lumbar spine.  No definite signal abnormality in the lower thoracic  spinal cord, conus at L1-L2.  Lumbar facet hypertrophy.  No lumbar spinal stenosis.  IMPRESSION: MR THORACIC SPINE IMPRESSION  1. Abnormal marrow edema in the T3 and T4 vertebral bodies highly suspicious for acute osteomyelitis in this setting. No associated epidural or paraspinal inflammation identified. 2. No other acute findings in the thoracic spine. Epidural lipomatosis. 3. Left greater than right layering pleural effusions.  MR LUMBAR SPINE IMPRESSION  1. Mild increased signal in the L4-L5 level erector spinae muscles bilaterally, which could be infectious myositis but also this appearance can be seen without infection in chronically debilitated patients. 2. No other acute  finding in the lumbar spine. 3. Decreased bone marrow signal suggestive of red marrow conversion versus hemosiderosis.  Electronically Signed: By: Augusto [name] M.D. On: 01/15/2015 14:20   Mr Lumbar Spine Wo Contrast  01/15/2015   ADDENDUM REPORT: 01/15/2015 14:40  ADDENDUM: Study discussed by telephone with Dr. Paulette Blanch DAM on 01/15/2015 at 1435 hrs.   Electronically Signed   By: Augusto [name] M.D.   On: 01/15/2015 14:40   01/15/2015   CLINICAL DATA:  35 year old male with sepsis and back pain. Into card itis, purulent pericarditis, multiple small embolic infarcts. Initial encounter.  EXAM: MRI THORACIC AND LUMBAR SPINE WITHOUT CONTRAST  TECHNIQUE: Multiplanar and multiecho pulse sequences of the thoracic and lumbar spine were obtained without intravenous contrast.  COMPARISON:  Chest radiographs 01/14/2015. CT Abdomen and Pelvis 05/18/2013.  FINDINGS: MR THORACIC SPINE FINDINGS  Limited sagittal imaging of the cervical spine is unremarkable.  Preserved thoracic vertebral height and alignment, preserved thoracic vertebral height and alignment.  Throughout much of the T3 and T4 vertebral bodies there is heterogeneous T2 signal as well as patchy pre STIR signal. The STIR images were repeated, an the same patchy increased signal persisted at  T3 and T4.  No definite signal abnormality in the intervening disc, or adjacent disc spaces; there is widespread intervertebral disc T2 hyperintensity, as a normal finding seen in young patients.  No definite paraspinal or epidural inflammation at those levels.  No suspicious marrow signal elsewhere; intrinsic T1 and T2 hyperintensity at T6 is a benign vertebral body hemangioma.  However, there are layering pleural effusions left greater than right. Negative visualized upper abdominal viscera.  Epidural lipomatosis throughout the thoracic spine and effaces CSF from the thecal sac from the T5 to the T9 level. No definite spinal cord signal abnormality.  MR LUMBAR SPINE FINDINGS  In the lumbar spine T1-T2 and stir marrow signal appears diminished. This is also true in the visible pelvis. Possible associated decreased T2 signal in both the liver and spleen.  Otherwise negative visualized abdominal viscera.  There is mildly increased STIR signal in the lower lumbar erector spinae muscles bilaterally, at L4-L5 (series 17, images 9 and 1). Subcutaneous edema incidentally noted.  Normal lumbar vertebral height and alignment and no marrow edema or acute osseous abnormality in the lumbar spine.  No definite signal abnormality in the lower thoracic spinal cord, conus at L1-L2.  Lumbar facet hypertrophy.  No lumbar spinal stenosis.  IMPRESSION: MR THORACIC SPINE IMPRESSION  1. Abnormal marrow edema in the T3 and T4 vertebral bodies highly suspicious for acute osteomyelitis in this setting. No associated epidural or paraspinal inflammation identified. 2. No other acute findings in the thoracic spine. Epidural lipomatosis. 3. Left greater than right layering pleural effusions.  MR LUMBAR SPINE IMPRESSION  1. Mild increased signal in the L4-L5 level erector spinae muscles bilaterally, which could be infectious myositis but also this appearance can be seen without infection in chronically debilitated patients. 2. No other acute  finding in the lumbar spine. 3. Decreased bone marrow signal suggestive of red marrow conversion versus hemosiderosis.  Electronically Signed: By: Augusto [name] M.D. On: 01/15/2015 14:20   US Renal  01/12/2015   CLINICAL DATA:  Acute renal failure  EXAM: RENAL/URINARY TRACT ULTRASOUND COMPLETE  COMPARISON:  None.  FINDINGS: Right Kidney:  Length: 12.8 cm in length. Diffuse increased cortical echogenicity suspicious for medical renal disease. No hydronephrosis. No renal calculus.  Left Kidney:  Length: . 12.6 cm in length. Diffuse increased echogenicity  suspicious for medical renal disease. No hydronephrosis. No diagnostic renal calculus.  Bladder:  Appears normal for degree of bladder distention.  IMPRESSION: Bilateral kidneys shows diffuse increased echogenicity. Medical renal disease cannot be excluded. No hydronephrosis or diagnostic renal calculus.   Electronically Signed   By: Natasha Mead M.D.   On: 01/12/2015 16:08   Mr Shoulder Right Wo Contrast  01/11/2015   CLINICAL DATA:  Right shoulder pain.  EXAM: MRI OF THE RIGHT SHOULDER WITHOUT CONTRAST  TECHNIQUE: Multiplanar, multisequence MR imaging of the shoulder was performed. No intravenous contrast was administered.  COMPARISON:  Radiographs dated 01/09/2015  FINDINGS: Rotator cuff:  Normal.  Muscles:  Normal.  Biceps long head: Properly located and intact. There is inflammation in the soft tissues around the bicipital tendon sheath with fluid in the bicipital tendon sheath.  Acromioclavicular Joint: Small degenerative cyst in the acromion. Otherwise normal AC joint. Type 1 acromion. No bursitis.  Glenohumeral Joint: There is a moderate glenohumeral joint effusion with extravasation of contrast from the subcoracoid recess of the joint into the adjacent soft tissues. Fluid distends the bicipital tendon sheath.  Labrum:  Intact.  Bones: No significant abnormalities. Tiny degenerative cystic areas in the posterior aspect of the greater tuberosity of the proximal  humerus.  IMPRESSION: 1. Moderate glenohumeral joint effusion with some extravasation of joint. Fluid distends the bicipital tendon sheath with inflammation/extravasated joint fluid around the bicipital tendon sheath. The possibly of synovitis should be considered. 2. Normal rotator cuff.   Electronically Signed   By: Geanie Cooley M.D.   On: 01/11/2015 11:21   Mr Tibia Fibula Right Wo Contrast  01/15/2015   CLINICAL DATA:  Diabetic patient with bacteremia and right leg pain.  EXAM: MRI OF LOWER RIGHT EXTREMITY WITHOUT CONTRAST  TECHNIQUE: Multiplanar, multisequence MR imaging of the right lower leg was performed. No intravenous contrast was administered.  COMPARISON:  None.  FINDINGS: The axial and coronal sequences incidentally include the left lower leg. There is marked subcutaneous edema of both lower legs which appears slightly worse on the left. No focal subcutaneous fluid collection is identified. Mildly increased T2 signal is seen in the gastrocnemius, soleus and and tibialis posterior muscles bilaterally which appears symmetric. No intramuscular fluid collection is identified. No bone marrow signal abnormality to suggest osteomyelitis is seen. There is no muscle or tendon tear.  IMPRESSION: Intense bilateral lower leg subcutaneous edema appears worse on the left and could be due to dependent change or cellulitis.  Mildly increased T2 signal in the gastrocnemius, soleus and tibialis posterior muscles bilaterally could be due to myositis or early changes of denervation atrophy in this diabetic patient.  Negative for abscess or osteomyelitis.   Electronically Signed   By: Drusilla Kanner M.D.   On: 01/15/2015 08:28   Mr Foot Right Wo Contrast  01/11/2015   CLINICAL DATA:  Diabetic foot ulcers.  EXAM: MRI OF THE RIGHT FOREFOOT WITHOUT CONTRAST  TECHNIQUE: Multiplanar, multisequence MR imaging was performed. No intravenous contrast was administered.  COMPARISON:  Radiographs dated 01/09/2011  FINDINGS:  There are small erosions of the proximal phalanx at the IP joint of the great toe and there are small erosions at the first metatarsal phalangeal joint, both consistent with gout. There are no soft tissue abscesses. There is no osteomyelitis. There is nonspecific edema in the subcutaneous fat of the dorsum of the foot. Muscles and tendons and ligaments appear appear normal. No joint effusions.  IMPRESSION: Subcutaneous edema primarily on the dorsum of  the foot. 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   Electronically Signed   By: Geanie Cooley M.D.   On: 01/11/2015 12:50   Mr Femur Left Wo Contrast  01/12/2015   CLINICAL DATA:  Diabetes. Peripheral neuropathy. Sepsis. Bilateral thigh pain. RIGHT thigh pain. Tenderness in the medial aspect of the thigh.  EXAM: MR OF THE LEFT FEMUR WITHOUT CONTRAST  TECHNIQUE: Multiplanar, multisequence MR imaging was performed. No intravenous contrast was administered.  COMPARISON:  None.  FINDINGS: Diffuse subcutaneous edema is present around the pelvis and in both thighs, most compatible with anasarca. This could also represent cellulitis in the appropriate clinical setting. Subcutaneous fluid is present in the lateral LEFT thigh, without a discrete abscess. There is myositis of the LEFT vastus lateralis with mild muscular edema av but no significant swelling. No fatty atrophy.  Bone marrow signal shows heterogenous marrow. This is a nonspecific finding most commonly associated with obesity, anemia, cigarette smoking or chronic disease. Negative for hip fracture or AVN. There is no evidence of osteomyelitis. No deep soft tissue abscesses are present.  Bilaterally in the groin, there are prominent lymph nodes which are probably reactive to lower extremity process.  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  IMPRESSION: 1. Diffuse subcutaneous edema in the pelvis and thighs bilaterally, suggesting anasarca. 2.  More prominent subcutaneous edema along the LEFT thigh with subcutaneous fluid superficial to the muscular fascia, likely representing cellulitis. No abscess. 3. Myositis of the proximal vastus lateralis, probably infectious. Reactive edema could produce this appearance as well. 4. Reactive inguinal adenopathy bilaterally. 5. Negative for osteomyelitis.   Electronically Signed   By: Andreas Newport M.D.   On: 01/12/2015 14:33   Mr Foot Left Wo Contrast  01/11/2015   CLINICAL DATA:  Diabetic foot ulcers.  Foot pain.  Bony erosions.  EXAM: MRI OF THE LEFT FOREFOOT WITHOUT CONTRAST  TECHNIQUE: Multiplanar, multisequence MR imaging was performed. No intravenous contrast was administered.  COMPARISON:  Radiograph dated 01/09/2015  FINDINGS: There is slight edema throughout the medial cuneiform. There is artifactual increased signal from the metatarsal heads in from the proximal phalanx of the great toe. There are erosions at IP joint and first metatarsal phalangeal joint consistent with gout.  There are no soft tissue abscesses and there is no osteomyelitis. There is prominent edema in the subcutaneous fat of the dorsum of the foot.  IMPRESSION: No evidence of osteomyelitis or abscess. The edema in the medial cuneiform is most likely a stress reaction. 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   Electronically Signed   By: Geanie Cooley M.D.   On: 01/11/2015 12:15   Dg Chest Port 1 View  01/26/2015   CLINICAL DATA:  Shortness of breath.  Feels choked when lying flat.  EXAM: PORTABLE CHEST - 1 VIEW  COMPARISON:  01/22/2015  FINDINGS: Cardiac enlargement without vascular congestion. Shallow inspiration with mild atelectasis in the lung bases. Small bilateral pleural effusion. No pneumothorax. No change since prior study.  IMPRESSION: Cardiac enlargement. Atelectasis in the lung bases. Small bilateral pleural effusion.   Electronically Signed   By: Burman Nieves M.D.   On: 01/26/2015  05:55   Dg Chest Port 1 View  01/10/2015   CLINICAL DATA:  Fever and sweats.  EXAM: PORTABLE CHEST - 1 VIEW  COMPARISON:  None.  FINDINGS: Low lung volumes with vascular crowding and streaky subsegmental atelectasis. No  obvious infiltrate or effusion. The heart is within normal limits in size given the AP portable technique. The bony structures are grossly normal.  IMPRESSION: Low lung volumes with vascular crowding and bibasilar atelectasis.   Electronically Signed   By: Loralie Champagne M.D.   On: 01/10/2015 11:27   Dg Foot Complete Left  01/09/2015   CLINICAL DATA:  35 year old male with recurrent bilateral foot pain.  EXAM: LEFT FOOT - COMPLETE 3+ VIEW  COMPARISON:  01/05/2014.  FINDINGS: Large periarticular erosions are noted adjacent to the first MTP joint, and at the first interphalangeal joint along the medial margin in both locations. These have erosions have sclerotic margins and overhanging edges, and there is adjacent soft tissue prominence, suggestive of gouty erosions. No adjacent soft tissue calcification is noted. No acute displaced fracture, subluxation or dislocation. Small heterotopic ossification adjacent to the plantar aspect of the calcaneus, likely ossification within the plantar fascia.  IMPRESSION: 1. Findings, as above, suggestive of underlying gout. Clinical correlation is recommended.   Electronically Signed   By: Trudie Reed M.D.   On: 01/09/2015 09:46   Dg Foot Complete Right  01/09/2015   CLINICAL DATA:  Recurring bilateral foot pain  EXAM: RIGHT FOOT COMPLETE - 3+ VIEW  COMPARISON:  None.  FINDINGS: No fracture or dislocation is seen.  Marginal erosion at the medial aspect of the 1st IP joint, involving the proximal phalanx.  The visualized soft tissues are unremarkable.  IMPRESSION: Marginal erosion at the medial aspect of the 1st IP joint. Correlate for inflammatory arthropathy such as gout.  Otherwise, no acute osseus abnormality is seen.   Electronically Signed   By:  Charline Bills M.D.   On: 01/09/2015 09:44   Dg Fluoro Guide Ndl Plc/bx  01/14/2015   CLINICAL DATA:  Right shoulder pain and effusion.  Bacteremia.  EXAM: RIGHT SHOULDER ASPIRATION UNDER FLUOROSCOPY  FLUOROSCOPY TIME:  0 min 24 seconds  PROCEDURE: Overlying skin prepped with Betadine, draped in the usual sterile fashion, and infiltrated locally with buffered Lidocaine. Twenty gauge spinal needle was placed in the right glenohumeral joint under direct fluoroscopic visualization.  2 cc of bloody cloudy fluid was aspirated from the joint. Mr. Revolorio tolerated the procedure well.  IMPRESSION: Technically successful right shoulder aspiration under fluoroscopy. Fluid sent for laboratory analysis.   Electronically Signed   By: Geanie Cooley M.D.   On: 01/14/2015 09:30    Microbiology: Recent Results (from the past 240 hour(s))  Culture, blood (routine x 2)     Status: None   Collection Time: 01/18/15  6:10 PM  Result Value Ref Range Status   Specimen Description BLOOD LEFT ARM  Final   Special Requests BOTTLES DRAWN AEROBIC AND ANAEROBIC 10CC  Final   Culture   Final    NO GROWTH 5 DAYS Performed at [facility]    Report Status 01/25/2015 FINAL  Final  Culture, blood (routine x 2)     Status: None   Collection Time: 01/18/15  6:18 PM  Result Value Ref Range Status   Specimen Description BLOOD RIGHT ARM  Final   Special Requests BOTTLES DRAWN AEROBIC AND ANAEROBIC 10CC  Final   Culture   Final    NO GROWTH 5 DAYS Performed at [facility]    Report Status 01/25/2015 FINAL  Final  Surgical pcr screen     Status: None   Collection Time: 01/25/15  4:47 AM  Result Value Ref Range Status   MRSA, PCR NEGATIVE  NEGATIVE Final   Staphylococcus aureus NEGATIVE NEGATIVE Final    Comment:        The Xpert SA Assay (FDA approved for NASAL specimens in patients over 24 years of age), is one component of a comprehensive surveillance program.  Test performance has been  validated by [facility] for patients greater than or equal to 22 year old. It is not intended to diagnose infection nor to guide or monitor treatment.      Labs: Basic Metabolic Panel:  Recent Labs Lab 01/24/15 0450 01/25/15 0505 01/26/15 0748 01/27/15 0555 01/28/15 0451  NA 136 139 134* 136 138  K 4.0 4.0 4.1 3.3* 3.8  CL 100 104 99 101 105  CO2 31 31 26 29 29   GLUCOSE 198* 132* 232* 136* 187*  BUN 22 22 22 18 13   CREATININE 2.10* 2.38* 2.42* 2.24* 2.07*  CALCIUM 8.1* 7.9* 8.5 8.4 8.3*   Liver Function Tests: No results for input(s): AST, ALT, ALKPHOS, BILITOT, PROT, ALBUMIN in the last 168 hours. No results for input(s): LIPASE, AMYLASE in the last 168 hours. No results for input(s): AMMONIA in the last 168 hours. CBC:  Recent Labs Lab 01/24/15 0450 01/25/15 0505 01/26/15 0748 01/27/15 0555 01/28/15 0451  WBC 10.1 9.0 10.6* 7.1 6.9  HGB 7.7* 7.3* 9.2* 9.1* 9.0*  HCT 25.0* 23.2* 28.2* 28.0* 28.2*  MCV 88.3 87.5 85.2 85.4 87.3  PLT 409* 384 430* 378 338   Cardiac Enzymes:  Recent Labs Lab 01/26/15 0748  CKTOTAL 166   BNP: BNP (last 3 results) No results for input(s): PROBNP in the last 8760 hours. CBG:  Recent Labs Lab 01/27/15 1124 01/27/15 1645 01/27/15 2148 01/28/15 0614 01/28/15 1114  GLUCAP 136* 119* 138* 181* 66*       Signed:  Jeremie Giangrande  [facility] 01/28/2015, 11:44 AM

## 2015-01-31 ENCOUNTER — Encounter (HOSPITAL_COMMUNITY): Payer: Self-pay

## 2015-01-31 NOTE — Telephone Encounter (Signed)
Patient contacted regarding discharge from [facility] on 1/29.  Patient understands to follow up with provider Ronie Spiesayna Dunn, PA-C on Friday Feb. [facility]. Patient understands discharge instructions? Yes Patient understands medications and regiment? yes Patient understands to bring all medications to this visit? Yes  Patient denies complaints or questions from discharge and is aware of f/u appointment at our office on 2/5.

## 2015-02-02 ENCOUNTER — Encounter (HOSPITAL_COMMUNITY): Payer: Self-pay | Admitting: Physical Medicine and Rehabilitation

## 2015-02-02 ENCOUNTER — Emergency Department (HOSPITAL_COMMUNITY): Payer: Medicaid Other

## 2015-02-02 ENCOUNTER — Inpatient Hospital Stay (HOSPITAL_COMMUNITY)
Admission: EM | Admit: 2015-02-02 | Discharge: 2015-02-25 | DRG: 219 | Disposition: A | Discharge status: Home Health | Payer: Medicaid Other | Attending: Thoracic Surgery (Cardiothoracic Vascular Surgery) | Admitting: Thoracic Surgery (Cardiothoracic Vascular Surgery)

## 2015-02-02 DIAGNOSIS — X58XXXA Exposure to other specified factors, initial encounter: Secondary | ICD-10-CM | POA: Diagnosis present

## 2015-02-02 DIAGNOSIS — D638 Anemia in other chronic diseases classified elsewhere: Secondary | ICD-10-CM | POA: Diagnosis present

## 2015-02-02 DIAGNOSIS — M659 Synovitis and tenosynovitis, unspecified: Secondary | ICD-10-CM | POA: Diagnosis present

## 2015-02-02 DIAGNOSIS — S46211A Strain of muscle, fascia and tendon of other parts of biceps, right arm, initial encounter: Secondary | ICD-10-CM | POA: Diagnosis present

## 2015-02-02 DIAGNOSIS — N183 Chronic kidney disease, stage 3 unspecified: Secondary | ICD-10-CM | POA: Diagnosis present

## 2015-02-02 DIAGNOSIS — Z7901 Long term (current) use of anticoagulants: Secondary | ICD-10-CM

## 2015-02-02 DIAGNOSIS — Z9889 Other specified postprocedural states: Secondary | ICD-10-CM

## 2015-02-02 DIAGNOSIS — R05 Cough: Secondary | ICD-10-CM

## 2015-02-02 DIAGNOSIS — I10 Essential (primary) hypertension: Secondary | ICD-10-CM | POA: Diagnosis not present

## 2015-02-02 DIAGNOSIS — N189 Chronic kidney disease, unspecified: Secondary | ICD-10-CM | POA: Diagnosis not present

## 2015-02-02 DIAGNOSIS — M009 Pyogenic arthritis, unspecified: Secondary | ICD-10-CM | POA: Diagnosis present

## 2015-02-02 DIAGNOSIS — I130 Hypertensive heart and chronic kidney disease with heart failure and stage 1 through stage 4 chronic kidney disease, or unspecified chronic kidney disease: Secondary | ICD-10-CM | POA: Diagnosis present

## 2015-02-02 DIAGNOSIS — R200 Anesthesia of skin: Secondary | ICD-10-CM

## 2015-02-02 DIAGNOSIS — I38 Endocarditis, valve unspecified: Secondary | ICD-10-CM | POA: Diagnosis not present

## 2015-02-02 DIAGNOSIS — I5033 Acute on chronic diastolic (congestive) heart failure: Secondary | ICD-10-CM | POA: Diagnosis present

## 2015-02-02 DIAGNOSIS — I76 Septic arterial embolism: Secondary | ICD-10-CM | POA: Diagnosis present

## 2015-02-02 DIAGNOSIS — K5900 Constipation, unspecified: Secondary | ICD-10-CM | POA: Diagnosis not present

## 2015-02-02 DIAGNOSIS — N179 Acute kidney failure, unspecified: Secondary | ICD-10-CM | POA: Diagnosis not present

## 2015-02-02 DIAGNOSIS — M25511 Pain in right shoulder: Secondary | ICD-10-CM

## 2015-02-02 DIAGNOSIS — B958 Unspecified staphylococcus as the cause of diseases classified elsewhere: Secondary | ICD-10-CM | POA: Diagnosis not present

## 2015-02-02 DIAGNOSIS — I669 Occlusion and stenosis of unspecified cerebral artery: Secondary | ICD-10-CM | POA: Diagnosis present

## 2015-02-02 DIAGNOSIS — B9561 Methicillin susceptible Staphylococcus aureus infection as the cause of diseases classified elsewhere: Secondary | ICD-10-CM | POA: Diagnosis present

## 2015-02-02 DIAGNOSIS — E162 Hypoglycemia, unspecified: Secondary | ICD-10-CM

## 2015-02-02 DIAGNOSIS — E876 Hypokalemia: Secondary | ICD-10-CM | POA: Diagnosis not present

## 2015-02-02 DIAGNOSIS — I340 Nonrheumatic mitral (valve) insufficiency: Secondary | ICD-10-CM | POA: Diagnosis present

## 2015-02-02 DIAGNOSIS — R7881 Bacteremia: Secondary | ICD-10-CM

## 2015-02-02 DIAGNOSIS — Z79899 Other long term (current) drug therapy: Secondary | ICD-10-CM

## 2015-02-02 DIAGNOSIS — E1165 Type 2 diabetes mellitus with hyperglycemia: Secondary | ICD-10-CM | POA: Diagnosis not present

## 2015-02-02 DIAGNOSIS — E860 Dehydration: Secondary | ICD-10-CM | POA: Diagnosis present

## 2015-02-02 DIAGNOSIS — E11649 Type 2 diabetes mellitus with hypoglycemia without coma: Secondary | ICD-10-CM | POA: Diagnosis present

## 2015-02-02 DIAGNOSIS — R601 Generalized edema: Secondary | ICD-10-CM | POA: Diagnosis not present

## 2015-02-02 DIAGNOSIS — N184 Chronic kidney disease, stage 4 (severe): Secondary | ICD-10-CM | POA: Diagnosis present

## 2015-02-02 DIAGNOSIS — I5031 Acute diastolic (congestive) heart failure: Secondary | ICD-10-CM | POA: Diagnosis present

## 2015-02-02 DIAGNOSIS — D62 Acute posthemorrhagic anemia: Secondary | ICD-10-CM | POA: Diagnosis present

## 2015-02-02 DIAGNOSIS — S43431A Superior glenoid labrum lesion of right shoulder, initial encounter: Secondary | ICD-10-CM | POA: Diagnosis present

## 2015-02-02 DIAGNOSIS — F17210 Nicotine dependence, cigarettes, uncomplicated: Secondary | ICD-10-CM | POA: Diagnosis present

## 2015-02-02 DIAGNOSIS — I739 Peripheral vascular disease, unspecified: Secondary | ICD-10-CM | POA: Diagnosis present

## 2015-02-02 DIAGNOSIS — IMO0002 Reserved for concepts with insufficient information to code with codable children: Secondary | ICD-10-CM | POA: Diagnosis present

## 2015-02-02 DIAGNOSIS — M4620 Osteomyelitis of vertebra, site unspecified: Secondary | ICD-10-CM | POA: Diagnosis present

## 2015-02-02 DIAGNOSIS — I509 Heart failure, unspecified: Secondary | ICD-10-CM

## 2015-02-02 DIAGNOSIS — M4644 Discitis, unspecified, thoracic region: Secondary | ICD-10-CM | POA: Diagnosis present

## 2015-02-02 DIAGNOSIS — I330 Acute and subacute infective endocarditis: Secondary | ICD-10-CM | POA: Diagnosis present

## 2015-02-02 DIAGNOSIS — Z7982 Long term (current) use of aspirin: Secondary | ICD-10-CM

## 2015-02-02 DIAGNOSIS — R609 Edema, unspecified: Secondary | ICD-10-CM | POA: Diagnosis not present

## 2015-02-02 DIAGNOSIS — R0600 Dyspnea, unspecified: Secondary | ICD-10-CM | POA: Diagnosis not present

## 2015-02-02 DIAGNOSIS — I309 Acute pericarditis, unspecified: Secondary | ICD-10-CM | POA: Diagnosis present

## 2015-02-02 DIAGNOSIS — M4624 Osteomyelitis of vertebra, thoracic region: Secondary | ICD-10-CM | POA: Diagnosis present

## 2015-02-02 DIAGNOSIS — I059 Rheumatic mitral valve disease, unspecified: Secondary | ICD-10-CM | POA: Diagnosis not present

## 2015-02-02 DIAGNOSIS — E8809 Other disorders of plasma-protein metabolism, not elsewhere classified: Secondary | ICD-10-CM | POA: Diagnosis present

## 2015-02-02 DIAGNOSIS — Z794 Long term (current) use of insulin: Secondary | ICD-10-CM | POA: Diagnosis not present

## 2015-02-02 DIAGNOSIS — N50811 Right testicular pain: Secondary | ICD-10-CM

## 2015-02-02 DIAGNOSIS — J9811 Atelectasis: Secondary | ICD-10-CM

## 2015-02-02 DIAGNOSIS — R059 Cough, unspecified: Secondary | ICD-10-CM

## 2015-02-02 DIAGNOSIS — Z9119 Patient's noncompliance with other medical treatment and regimen: Secondary | ICD-10-CM | POA: Diagnosis present

## 2015-02-02 DIAGNOSIS — R079 Chest pain, unspecified: Secondary | ICD-10-CM | POA: Diagnosis present

## 2015-02-02 HISTORY — DX: Methicillin susceptible Staphylococcus aureus infection, unspecified site: A49.01

## 2015-02-02 HISTORY — DX: Type 2 diabetes mellitus without complications: E11.9

## 2015-02-02 HISTORY — DX: Other specified postprocedural states: Z98.890

## 2015-02-02 LAB — COMPREHENSIVE METABOLIC PANEL
ALBUMIN: 1.6 g/dL — AB (ref 3.5–5.2)
ALT: 20 U/L (ref 0–53)
AST: 18 U/L (ref 0–37)
Alkaline Phosphatase: 112 U/L (ref 39–117)
Anion gap: 5 (ref 5–15)
BILIRUBIN TOTAL: 0.3 mg/dL (ref 0.3–1.2)
BUN: 12 mg/dL (ref 6–23)
CHLORIDE: 110 mmol/L (ref 96–112)
CO2: 27 mmol/L (ref 19–32)
Calcium: 8.2 mg/dL — ABNORMAL LOW (ref 8.4–10.5)
Creatinine, Ser: 1.73 mg/dL — ABNORMAL HIGH (ref 0.50–1.35)
GFR calc Af Amer: 58 mL/min — ABNORMAL LOW (ref >90)
GFR calc non Af Amer: 50 mL/min — ABNORMAL LOW (ref >90)
GLUCOSE: 65 mg/dL — AB (ref 70–99)
Potassium: 3.6 mmol/L (ref 3.5–5.1)
Sodium: 142 mmol/L (ref 135–145)
TOTAL PROTEIN: 6.5 g/dL (ref 6.0–8.3)

## 2015-02-02 LAB — CBC WITH DIFFERENTIAL/PLATELET
Basophils Absolute: 0 10*3/uL (ref 0.0–0.1)
Basophils Relative: 1 % (ref 0–1)
Eosinophils Absolute: 0.5 10*3/uL (ref 0.0–0.7)
Eosinophils Relative: 8 % — ABNORMAL HIGH (ref 0–5)
HCT: 27.2 % — ABNORMAL LOW (ref 39.0–52.0)
Hemoglobin: 9 g/dL — ABNORMAL LOW (ref 13.0–17.0)
Lymphocytes Relative: 19 % (ref 12–46)
Lymphs Abs: 1.2 10*3/uL (ref 0.7–4.0)
MCH: 28.5 pg (ref 26.0–34.0)
MCHC: 33.1 g/dL (ref 30.0–36.0)
MCV: 86.1 fL (ref 78.0–100.0)
MONOS PCT: 6 % (ref 3–12)
Monocytes Absolute: 0.4 10*3/uL (ref 0.1–1.0)
Neutro Abs: 4.2 10*3/uL (ref 1.7–7.7)
Neutrophils Relative %: 66 % (ref 43–77)
PLATELETS: 316 10*3/uL (ref 150–400)
RBC: 3.16 MIL/uL — ABNORMAL LOW (ref 4.22–5.81)
RDW: 13.5 % (ref 11.5–15.5)
WBC: 6.3 10*3/uL (ref 4.0–10.5)

## 2015-02-02 LAB — CBG MONITORING, ED
GLUCOSE-CAPILLARY: 101 mg/dL — AB (ref 70–99)
Glucose-Capillary: 43 mg/dL — CL (ref 70–99)
Glucose-Capillary: 45 mg/dL — ABNORMAL LOW (ref 70–99)

## 2015-02-02 LAB — GLUCOSE, CAPILLARY: GLUCOSE-CAPILLARY: 149 mg/dL — AB (ref 70–99)

## 2015-02-02 LAB — BRAIN NATRIURETIC PEPTIDE: B NATRIURETIC PEPTIDE 5: 629 pg/mL — AB (ref 0.0–100.0)

## 2015-02-02 LAB — I-STAT TROPONIN, ED: TROPONIN I, POC: 0.02 ng/mL (ref 0.00–0.08)

## 2015-02-02 LAB — MRSA PCR SCREENING: MRSA BY PCR: NEGATIVE

## 2015-02-02 MED ORDER — ONDANSETRON HCL 4 MG/2ML IJ SOLN: 4.0000 mg | Freq: Four times a day (QID) | INTRAMUSCULAR | Status: DC | PRN

## 2015-02-02 MED ORDER — HYDRALAZINE HCL 25 MG PO TABS
25.0000 mg | ORAL_TABLET | Freq: Three times a day (TID) | ORAL | Status: DC
  Administered 2015-02-02 – 2015-02-03 (×2): 25 mg via ORAL
  Filled 2015-02-02 (×2): qty 1

## 2015-02-02 MED ORDER — PRO-STAT SUGAR FREE PO LIQD: 30.0000 mL | ORAL | Status: DC

## 2015-02-02 MED ORDER — POLYETHYLENE GLYCOL 3350 17 G PO PACK: 17.0000 g | PACK | Freq: Every day | ORAL | Status: DC

## 2015-02-02 MED ORDER — INSULIN ASPART 100 UNIT/ML ~~LOC~~ SOLN
0.0000 [IU] | Freq: Every day | SUBCUTANEOUS | Status: DC
Start: 2015-02-02 — End: 2015-02-02

## 2015-02-02 MED ORDER — MAGNESIUM HYDROXIDE 400 MG/5ML PO SUSP
30.0000 mL | Freq: Every day | ORAL | Status: DC | PRN
  Filled 2015-02-02: qty 30

## 2015-02-02 MED ORDER — SENNOSIDES-DOCUSATE SODIUM 8.6-50 MG PO TABS: 2.0000 | ORAL_TABLET | Freq: Two times a day (BID) | ORAL | Status: DC

## 2015-02-02 MED ORDER — HEPARIN SODIUM (PORCINE) 5000 UNIT/ML IJ SOLN
5000.0000 [IU] | Freq: Three times a day (TID) | INTRAMUSCULAR | Status: DC
  Administered 2015-02-02 – 2015-02-04 (×6): 5000 [IU] via SUBCUTANEOUS
  Filled 2015-02-02 (×6): qty 1

## 2015-02-02 MED ORDER — FUROSEMIDE 10 MG/ML IJ SOLN: 80.0000 mg | Freq: Two times a day (BID) | INTRAMUSCULAR | Status: DC

## 2015-02-02 MED ORDER — NITROGLYCERIN 2 % TD OINT
0.5000 [in_us] | TOPICAL_OINTMENT | Freq: Four times a day (QID) | TRANSDERMAL | Status: DC
  Administered 2015-02-02 – 2015-02-06 (×12): 0.5 [in_us] via TOPICAL
  Filled 2015-02-02 (×2): qty 30
  Filled 2015-02-02: qty 1
  Filled 2015-02-02 (×3): qty 30

## 2015-02-02 MED ORDER — INFLUENZA VAC SPLIT QUAD 0.5 ML IM SUSY
0.5000 mL | PREFILLED_SYRINGE | INTRAMUSCULAR | Status: DC
  Filled 2015-02-02: qty 0.5

## 2015-02-02 MED ORDER — NICOTINE 7 MG/24HR TD PT24: 7.0000 mg | MEDICATED_PATCH | Freq: Every day | TRANSDERMAL | Status: DC

## 2015-02-02 MED ORDER — AMPICILLIN SODIUM 2 G IJ SOLR
2.0000 g | INTRAMUSCULAR | Status: DC
Start: 2015-02-02 — End: 2015-02-13
  Administered 2015-02-02 – 2015-02-13 (×65): 2 g via INTRAVENOUS
  Filled 2015-02-02 (×69): qty 2000

## 2015-02-02 MED ORDER — HYDRALAZINE HCL 20 MG/ML IJ SOLN
10.0000 mg | Freq: Four times a day (QID) | INTRAMUSCULAR | Status: DC | PRN
  Administered 2015-02-02 – 2015-02-12 (×10): 10 mg via INTRAVENOUS
  Filled 2015-02-02 (×10): qty 1

## 2015-02-02 MED ORDER — ALUM & MAG HYDROXIDE-SIMETH 200-200-20 MG/5ML PO SUSP: 30.0000 mL | Freq: Four times a day (QID) | ORAL | Status: DC | PRN

## 2015-02-02 MED ORDER — METOPROLOL TARTRATE 100 MG PO TABS
100.0000 mg | ORAL_TABLET | Freq: Two times a day (BID) | ORAL | Status: DC
  Administered 2015-02-02 – 2015-02-06 (×7): 100 mg via ORAL
  Filled 2015-02-02 (×8): qty 1

## 2015-02-02 MED ORDER — SODIUM CHLORIDE 0.9 % IJ SOLN
3.0000 mL | Freq: Two times a day (BID) | INTRAMUSCULAR | Status: DC
  Administered 2015-02-02 – 2015-02-17 (×8): 3 mL via INTRAVENOUS

## 2015-02-02 MED ORDER — SODIUM CHLORIDE 0.9 % IV SOLN: INTRAVENOUS | Status: DC

## 2015-02-02 MED ORDER — GUAIFENESIN-DM 100-10 MG/5ML PO SYRP
5.0000 mL | ORAL_SOLUTION | ORAL | Status: DC | PRN
  Administered 2015-02-03 – 2015-02-06 (×2): 5 mL via ORAL
  Filled 2015-02-02 (×2): qty 5

## 2015-02-02 MED ORDER — GLUCERNA SHAKE PO LIQD: 237.0000 mL | Freq: Two times a day (BID) | ORAL | Status: DC

## 2015-02-02 MED ORDER — INSULIN ASPART PROT & ASPART (70-30 MIX) 100 UNIT/ML ~~LOC~~ SUSP: 14.0000 [IU] | Freq: Two times a day (BID) | SUBCUTANEOUS | Status: DC

## 2015-02-02 MED ORDER — INSULIN ASPART 100 UNIT/ML ~~LOC~~ SOLN
0.0000 [IU] | Freq: Three times a day (TID) | SUBCUTANEOUS | Status: DC
Start: 2015-02-03 — End: 2015-02-18
  Administered 2015-02-03 (×3): 1 [IU] via SUBCUTANEOUS
  Administered 2015-02-04: 2 [IU] via SUBCUTANEOUS
  Administered 2015-02-05: 1 [IU] via SUBCUTANEOUS
  Administered 2015-02-06: 3 [IU] via SUBCUTANEOUS
  Administered 2015-02-06 – 2015-02-07 (×4): 2 [IU] via SUBCUTANEOUS
  Administered 2015-02-07: 1 [IU] via SUBCUTANEOUS
  Administered 2015-02-08 (×2): 3 [IU] via SUBCUTANEOUS
  Administered 2015-02-09 (×2): 2 [IU] via SUBCUTANEOUS
  Administered 2015-02-09: 5 [IU] via SUBCUTANEOUS
  Administered 2015-02-10 (×2): 3 [IU] via SUBCUTANEOUS
  Administered 2015-02-10 – 2015-02-11 (×3): 2 [IU] via SUBCUTANEOUS
  Administered 2015-02-11: 3 [IU] via SUBCUTANEOUS
  Administered 2015-02-12 (×2): 2 [IU] via SUBCUTANEOUS
  Administered 2015-02-13: 1 [IU] via SUBCUTANEOUS
  Administered 2015-02-14 (×2): 2 [IU] via SUBCUTANEOUS
  Administered 2015-02-14: 1 [IU] via SUBCUTANEOUS
  Administered 2015-02-15: 7 [IU] via SUBCUTANEOUS
  Administered 2015-02-15: 2 [IU] via SUBCUTANEOUS
  Administered 2015-02-15: 3 [IU] via SUBCUTANEOUS
  Administered 2015-02-16: 1 [IU] via SUBCUTANEOUS
  Administered 2015-02-16: 2 [IU] via SUBCUTANEOUS
  Administered 2015-02-16: 3 [IU] via SUBCUTANEOUS
  Administered 2015-02-17: 1 [IU] via SUBCUTANEOUS
  Administered 2015-02-17: 2 [IU] via SUBCUTANEOUS
  Administered 2015-02-17: 3 [IU] via SUBCUTANEOUS

## 2015-02-02 MED ORDER — ONDANSETRON HCL 4 MG PO TABS: 4.0000 mg | ORAL_TABLET | Freq: Four times a day (QID) | ORAL | Status: DC | PRN

## 2015-02-02 MED ORDER — HYDROCODONE-ACETAMINOPHEN 5-325 MG PO TABS
1.0000 | ORAL_TABLET | ORAL | Status: DC | PRN
  Administered 2015-02-02 – 2015-02-04 (×5): 2 via ORAL
  Administered 2015-02-05: 1 via ORAL
  Administered 2015-02-06: 2 via ORAL
  Filled 2015-02-02: qty 1
  Filled 2015-02-02 (×7): qty 2

## 2015-02-02 MED ORDER — FUROSEMIDE 10 MG/ML IJ SOLN
80.0000 mg | Freq: Two times a day (BID) | INTRAMUSCULAR | Status: DC
  Administered 2015-02-02 – 2015-02-07 (×9): 80 mg via INTRAVENOUS
  Filled 2015-02-02 (×10): qty 8

## 2015-02-02 NOTE — ED Notes (Signed)
Phlebotomy at the bedside  

## 2015-02-02 NOTE — H&P (Signed)
Patient Demographics  Dwayne [name], is a 35 y.o. male  MRN: [id number]   DOB - [birth year]  Admit Date - 02/02/2015  Outpatient Primary MD for the patient is No PCP Per Patient   With History of -  Past Medical History  Diagnosis Date  . Diabetes mellitus   . Hypertension   . Acute renal failure   . Bacteremia     MSSA   . Diabetes type 2, uncontrolled 01/09/2015  . Diabetic foot infection 01/15/2014  . 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 Essential hypertension, benign 01/18/2014  . Infective myositis of left thigh   . Noncompliance 01/18/2014  . Septic shock   . Smoker   . Staphylococcus aureus bacteremia with sepsis   . Tobacco abuse 01/15/2014  . Mitral regurgitation 01/13/2015    Severe by TEE      Past Surgical History  Procedure Laterality Date  . I&d extremity Bilateral 01/16/2014    Procedure:  DEBRIDEMENT of bilateral foot ulcers;  Surgeon: Kathryne Hitch, MD;  [facility];  Service: Orthopedics;  Laterality: Bilateral;  . Tee without cardioversion N/A 01/13/2015    Procedure: TRANSESOPHAGEAL ECHOCARDIOGRAM (TEE);  Surgeon: Thurmon Fair, MD;  Location: [facility] ENDOSCOPY;  Service: Cardiovascular;  Laterality: N/A;  . Left and right heart catheterization with coronary angiogram N/A 01/24/2015    Procedure: LEFT AND RIGHT HEART CATHETERIZATION WITH CORONARY ANGIOGRAM;  Surgeon: Peter M Swaziland, MD;  Location: [facility] CATH LAB;  Service: Cardiovascular;  Laterality: N/A;  . Multiple extractions with alveoloplasty N/A 01/25/2015    Procedure: Extraction of tooth #'s 1,2,3,4,5,6,8,9,11,12,13,14,15,16,17,18,19 28,31,32 with alveoloplasty and gross debridement of remaining teeth.;  Surgeon: Charlynne Pander, DDS;  Location: [facility] OR;  Service: Oral Surgery;  Laterality: N/A;    in for   Chief  Complaint  Patient presents with  . Chest Pain     HPI  Dwayne [name]  is a 35 y.o. 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 He was planned to undergo open heart surgery for valve replacement in 4-6 weeks.  He now comes to the ER with worsening edema up to his waist and scrotum, he is also developing progressive shortness of breath and orthopnea, no chest pain, no fever chills, in the ER workup suggestive of severe acute on chronic diastolic CHF, seen by cardiology who has contacted cardiothoracic surgery to Priebe on his surgery. That cultures were drawn to rule out ongoing bacteremia. I was called to admit the patient for acute on chronic diastolic CHF precipitated by mitral valve endocarditis.    Review of Systems    In addition to the  HPI above,   No Fever-chills, No Headache, No changes with Vision or hearing, No problems swallowing food or Liquids, No Chest pain, Cough , progressive orthopnea and Shortness of Breath, No Abdominal pain, No Nausea or Vommitting, Bowel movements are regular, No Blood in stool or Urine, No dysuria, No new skin rashes or bruises, No new joints pains-aches, except some ongoing right shoulder pain which is better than before. No new weakness, tingling, numbness in any extremity, No recent weight loss, or weight gain No polyuria, polydypsia or polyphagia, No significant Mental Stressors.  A full 10 point Review of Systems was done, except as stated above, all other Review of Systems were negative.   Social History History  Substance Use Topics  . Smoking status: Current Every Day Smoker    Types:  Cigars  . Smokeless tobacco: Never Used  . Alcohol Use: Yes      Family History No CAD at young age  Prior to Admission medications   Medication Sig Start Date End Date Taking? Authorizing Provider  ampicillin in sodium chloride 0.9 % 50 mL 12g continuous infusion via CAP pump with minimal fluid volume 01/28/15  Yes [name], DO  furosemide (LASIX) 40 MG tablet Take 0.5 tablets (20 mg total) by mouth daily. 01/28/15  Yes [name], DO  hydrALAZINE (APRESOLINE) 25 MG tablet Take 1 tablet (25 mg total) by mouth every 8 (eight) hours. 01/28/15  Yes [name], DO  insulin aspart protamine- aspart (NOVOLOG MIX 70/30) (70-30) 100 UNIT/ML injection Inject 0.14 mLs (14 Units total) into the skin 2 (two) times daily with a meal. 01/28/15  Yes [name], DO  Insulin Syringe-Needle U-100 28G X 1/2" 0.5 ML MISC Use as directed. Any generic available. 01/28/15  Yes [name], DO  Lancets (FREESTYLE) lancets Any generic available. Use as instructed 01/28/15  Yes [name], DO  metoprolol tartrate (LOPRESSOR) 25 MG tablet Take 3 tablets (75 mg total) by mouth 2 (two) times daily. 01/28/15  Yes [name], DO  oxyCODONE (OXY IR/ROXICODONE) 5 MG immediate release tablet Take 1 tablet (5 mg total) by mouth every 4 (four) hours as needed for moderate pain or severe pain. 01/28/15  Yes [name], DO  Amino Acids-Protein Hydrolys (FEEDING SUPPLEMENT, PRO-STAT SUGAR FREE 64,) LIQD Take 30 mLs by mouth daily. Patient not taking: Reported on 02/02/2015 01/28/15   Edsel PetrinMaryann Mikhail, DO  feeding supplement, GLUCERNA SHAKE, (GLUCERNA SHAKE) LIQD Take 237 mLs by mouth 2 (two) times daily between meals. Patient not taking: Reported on 02/02/2015 01/28/15   Nita SellsMaryann Mikhail, DO  nicotine (NICODERM CQ - DOSED IN MG/24 HR) 7 mg/24hr patch Place 1 patch (7 mg total) onto the skin daily. Patient not taking: Reported on 02/02/2015 01/28/15   Nita SellsMaryann Mikhail, DO  polyethylene glycol (MIRALAX /  GLYCOLAX) packet Take 17 g by mouth daily. Patient not taking: Reported on 02/02/2015 01/28/15   [name], DO  senna-docusate (SENOKOT-S) 8.6-50 MG per tablet Take 2 tablets by mouth 2 (two) times daily. Patient not taking: Reported on 02/02/2015 01/28/15   Edsel PetrinMaryann Mikhail, DO    No Known Allergies  Physical Exam  Vitals  Blood pressure 163/88, pulse 91, temperature 98.4 F (36.9 C), temperature source Oral, resp. rate 13, SpO2 97 %.   1. General young AA male lying in bed in NAD,     2. Normal affect and insight, Not Suicidal or Homicidal, Awake Alert, Oriented X 3.  3. No F.N deficits, ALL C.Nerves Intact, Strength 5/5 all  4 extremities, Sensation intact all 4 extremities, Plantars down going.  4. Ears and Eyes appear Normal, Conjunctivae clear, PERRLA. Moist Oral Mucosa. Multiple teeth removed.  5. Supple Neck, No JVD, No cervical lymphadenopathy appriciated, No Carotid Bruits.  6. Symmetrical Chest wall movement, Good air movement bilaterally, CTAB.  7. RRR, No Gallops, Rubs , positive systolic murmur, No Parasternal Heave.  8. Positive Bowel Sounds, Abdomen Soft, No tenderness, No organomegaly appriciated,No rebound -guarding or rigidity.  9.  No Cyanosis, Normal Skin Turgor, No Skin Rash or Bruise. 2+ edema upto mid abdomen  10. Good muscle tone,  joints appear normal , no effusions, Normal ROM. Minimal tenderness on right shoulder on deep palpation.  11. No Palpable Lymph Nodes in Neck or Axillae     Data Review  CBC  Recent Labs Lab 01/27/15 0555 01/28/15 0451 02/02/15 1035  WBC 7.1 6.9 6.3  HGB 9.1* 9.0* 9.0*  HCT 28.0* 28.2* 27.2*  PLT 378 338 316  MCV 85.4 87.3 86.1  MCH 27.7 27.9 28.5  MCHC 32.5 31.9 33.1  RDW 13.9 13.8 13.5  LYMPHSABS  --   --  1.2  MONOABS  --   --  0.4  EOSABS  --   --  0.5  BASOSABS  --   --  0.0    ------------------------------------------------------------------------------------------------------------------  Chemistries   Recent Labs Lab 01/27/15 0555 01/28/15 0451 02/02/15 1035  NA 136 138 142  K 3.3* 3.8 3.6  CL 101 105 110  CO2 GLUCOSE 136* 187* 65*  BUN CREATININE 2.24* 2.07* 1.73*  CALCIUM 8.4 8.3* 8.2*  AST  --   --  18  ALT  --   --  20  ALKPHOS  --   --  112  BILITOT  --   --  0.3   ------------------------------------------------------------------------------------------------------------------ estimated creatinine clearance is 79.4 mL/min (by C-G formula based on Cr of 1.73). ------------------------------------------------------------------------------------------------------------------ No results for input(s): TSH, T4TOTAL, T3FREE, THYROIDAB in the last 72 hours.  Invalid input(s): FREET3   Coagulation profile No results for input(s): INR, PROTIME in the last 168 hours. ------------------------------------------------------------------------------------------------------------------- No results for input(s): DDIMER in the last 72 hours. -------------------------------------------------------------------------------------------------------------------  Cardiac Enzymes No results for input(s): CKMB, TROPONINI, MYOGLOBIN in the last 168 hours.  Invalid input(s): CK ------------------------------------------------------------------------------------------------------------------ Invalid input(s): POCBNP   ---------------------------------------------------------------------------------------------------------------  Urinalysis    Component Value Date/Time   COLORURINE YELLOW 01/14/2015 0508   APPEARANCEUR CLEAR 01/14/2015 0508   LABSPEC 1.015 01/14/2015 0508   PHURINE 5.5 01/14/2015 0508   GLUCOSEU 250* 01/14/2015 0508   HGBUR MODERATE* 01/14/2015 0508   BILIRUBINUR NEGATIVE 01/14/2015 0508   KETONESUR NEGATIVE  01/14/2015 0508   PROTEINUR >300* 01/14/2015 0508   UROBILINOGEN 1.0 01/14/2015 0508   NITRITE NEGATIVE 01/14/2015 0508   LEUKOCYTESUR NEGATIVE 01/14/2015 0508    ----------------------------------------------------------------------------------------------------------------  Imaging results:   Dg Chest 2 View  02/02/2015   CLINICAL DATA:  left sided chest pain and SOB x today. Pt also c/o swelling of legs, arms and abdomen x a few weeks and a cough and congestion x a few days. Hx HTN controlled with medication, diabetes, and ex-smoker as of 01/09/15.  EXAM: CHEST - 2 VIEW  COMPARISON:  01/26/2015  FINDINGS: Patchy airspace opacities in both upper lobes, right greater than left, and at the left lung base. Left arm PICC line to the distal SVC. Small pleural effusions. Mild cardiomegaly stable. Visualized skeletal structures are unremarkable.  IMPRESSION: 1. New patchy airspace  infiltrates in both upper lobes and at the left lung base, suggesting multifocal pneumonia versus atypical edema. 2. Stable cardiomegaly and small pleural effusions. 3. PICC line to the distal SVC.   Electronically Signed   By: Oley Balm M.D.   On: 02/02/2015 11:52    My personal review of EKG: Rhythm NSR,  no Acute ST changes    Assessment & Plan   1. Acute on chronic diastolic dysfunction EF 60%. 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 He will be admitted to stepdown unit, IV Lasix 80 mg twice a day one dose now, nitro paste, continue beta blocker, R [employer] Cardura 6 surgery consulted by cardiology. Blood cultures drawn to make sure no ongoing bacteremia. Continue IV ampicillin dosed by pharmacy for now. Has left arm PICC line. Supportive care with oxygen and nebulizer treatments.   2. Dental caries. Multiple teeth removed one week ago by an to surgery.   3. Right shoulder septic arthritis. Seen by orthopedics last admission. IV antibiotics. No surgical intervention planned by orthopedics.  Clinically improved.   4. DM type II. Last A1c was above 8. A new home dose 70-30, add sliding scale.   5. Essential hypertension. Home medications to continue. Add Nitropaste and as needed IV hydrazine for better control.   6. Chronic kidney disease stage IV. Baseline creatinine around 2. Currently at or better than baseline.     DVT Prophylaxis Heparin    AM Labs Ordered, also please review Full Orders  Family Communication: Admission, patients condition and plan of care including tests being ordered have been discussed with the patient and wife who indicate understanding and agree with the plan and Code Status.  Code Status Full  Likely DC to  TBD  Condition GUARDED    Time spent in minutes : 35    Pilar Corrales K M.D on 02/02/2015 at 2:41 PM  Between 7am to 7pm - Pager - [phone]  After 7pm go to [email or web address] - password [facility]  [facility] Office  [phone]

## 2015-02-02 NOTE — Consult Note (Signed)
Reason for Consult: CHF Referring Physician: ER  Jewelz Kobus is an 35 y.o. male.  HPI:   Patient is a 35 year old male who was just discharged on 01/27/2015 with a diagnosis of mitral valve endocarditis with MSSA bacteremia. This was complicated by septic emboli to the brain and possible emboli of the kidney as well. 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 Normal LV function. Moderate to severe LVH. Other valves appeared normal.  His troponin elevation was related to demand ischemia.  A left heart catheterization on 01/24/2015 which showed normal coronary arteries moderate pulmonary hypertension elevated LV filling pressures with large V wave consistent with severe MR.  Patient underwent dental extraction on 01/25/2015. He was evaluated by cardiothoracic surgery who favored 6 week course of antibiotics before proceeding with mitral valve repair/replacement.  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 The right shoulder with septic arthritis/synovitis.  01/11/2015 he had a right ABI that was okay and the left of 0.64 with unobtainable left TBI. Vascular surgery thought he had an embolization to the left leg, however, the patient claimed to have a long history of left lower extremity claudication so aggressive workup was not initiated.  Lower extremity venous Doppler was negative for DVT. He was discharged on  ampicillin for a total of 6 weeks.  Patient presents today with SOB, orthopnea and worsening lower extremity and abdominal  edema.  He has been eating a lot of foods in can(soup, chilli, ravioli) and drinking a lot of fluid because his mouth is dry.  The patient currently denies nausea, vomiting, fever, chest pain, dizziness, abdominal pain, hematochezia, melena.  He does not weigh himself because he does not have a scale.  He reports good UOP.     Past Medical History  Diagnosis Date  . Diabetes mellitus   . Hypertension   . Acute renal failure   . Bacteremia     MSSA   . Diabetes type 2, uncontrolled 01/09/2015  . Diabetic foot infection 01/15/2014  . 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 Essential hypertension, benign 01/18/2014  . Infective myositis of left thigh   . Noncompliance 01/18/2014  . Septic shock   . Smoker   . Staphylococcus aureus bacteremia with sepsis   . Tobacco abuse 01/15/2014  . Mitral regurgitation 01/13/2015    Severe by TEE    Past Surgical History  Procedure Laterality Date  . I&d extremity Bilateral 01/16/2014    Procedure:  DEBRIDEMENT of bilateral foot ulcers;  Surgeon: [name], MD;  Location: [place];  Service: Orthopedics;  Laterality: Bilateral;  . Tee without cardioversion N/A 01/13/2015    Procedure: TRANSESOPHAGEAL ECHOCARDIOGRAM (TEE);  Surgeon: [name], MD;  Location: [facility] ENDOSCOPY;  Service: Cardiovascular;  Laterality: N/A;  . Left and right heart catheterization with coronary angiogram N/A 01/24/2015    Procedure: LEFT AND RIGHT HEART CATHETERIZATION WITH CORONARY ANGIOGRAM;  Surgeon: Keilyn Nadal M Martinique, MD;  Location: [facility] CATH LAB;  Service: Cardiovascular;  Laterality: N/A;  . Multiple extractions with alveoloplasty N/A 01/25/2015    Procedure: Extraction of tooth #'s 1,2,3,4,5,6,8,9,11,12,13,14,15,16,17,18,19 28,31,32 with alveoloplasty and gross  debridement of remaining teeth.;  Surgeon: [name], DDS;  Location: [place];  Service: Oral Surgery;  Laterality: N/A;    History reviewed. No pertinent family history.  Social History:  reports that  he has been smoking Cigars.  He has never used smokeless tobacco. He reports that he drinks alcohol. He reports that he does not use illicit drugs.  Allergies: No Known Allergies  Medications: Medication Sig  ampicillin in sodium chloride 0.9 % 50 mL 12g continuous infusion via CAP pump with minimal fluid volume  furosemide (LASIX) 40 MG tablet Take 0.5 tablets (20 mg total) by mouth daily.  hydrALAZINE (APRESOLINE) 25 MG tablet Take 1 tablet (25 mg total) by mouth every 8 (eight) hours.  insulin aspart protamine- aspart (NOVOLOG MIX 70/30) (70-30) 100 UNIT/ML injection Inject 0.14 mLs (14 Units total) into the skin 2 (two) times daily with a meal.  Insulin Syringe-Needle U-100 28G X 1/2" 0.5 ML MISC Use as directed. Any generic available.  Lancets (FREESTYLE) lancets Any generic available. Use as instructed  metoprolol tartrate (LOPRESSOR) 25 MG tablet Take 3 tablets (75 mg total) by mouth 2 (two) times daily.  oxyCODONE (OXY IR/ROXICODONE) 5 MG immediate release tablet Take 1 tablet (5 mg total) by mouth every 4 (four) hours as needed for moderate pain or severe pain.  Amino Acids-Protein Hydrolys (FEEDING SUPPLEMENT, PRO-STAT SUGAR FREE 64,) LIQD Take 30 mLs by mouth daily. Patient not taking: Reported on 02/02/2015  feeding supplement, GLUCERNA SHAKE, (GLUCERNA SHAKE) LIQD Take 237 mLs by mouth 2 (two) times daily between meals. Patient not taking: Reported on 02/02/2015  nicotine (NICODERM CQ - DOSED IN MG/24 HR) 7 mg/24hr patch Place 1 patch (7 mg total) onto the skin daily. Patient not taking: Reported on 02/02/2015  polyethylene glycol (MIRALAX / GLYCOLAX) packet Take 17 g by mouth daily. Patient not taking: Reported on 02/02/2015  senna-docusate (SENOKOT-S) 8.6-50 MG per tablet Take 2 tablets by mouth 2 (two) times daily. Patient not taking: Reported on 02/02/2015     Results for orders placed or performed during the hospital encounter of 02/02/15 (from the past 48 hour(s))  CBC with  Differential     Status: Abnormal   Collection Time: 02/02/15 10:35 AM  Result Value Ref Range   WBC 6.3 4.0 - 10.5 K/uL   RBC 3.16 (L) 4.22 - 5.81 MIL/uL   Hemoglobin 9.0 (L) 13.0 - 17.0 g/dL   HCT 27.2 (L) 39.0 - 52.0 %   MCV 86.1 78.0 - 100.0 fL   MCH 28.5 26.0 - 34.0 pg   MCHC 33.1 30.0 - 36.0 g/dL   RDW 13.5 11.5 - 15.5 %   Platelets 316 150 - 400 K/uL   Neutrophils Relative % 66 43 - 77 %   Neutro Abs 4.2 1.7 - 7.7 K/uL   Lymphocytes Relative 19 12 - 46 %   Lymphs Abs 1.2 0.7 - 4.0 K/uL   Monocytes Relative 6 3 - 12 %   Monocytes Absolute 0.4 0.1 - 1.0 K/uL   Eosinophils Relative 8 (H) 0 - 5 %   Eosinophils Absolute 0.5 0.0 - 0.7 K/uL   Basophils Relative 1 0 - 1 %   Basophils Absolute 0.0 0.0 - 0.1 K/uL  Comprehensive metabolic panel     Status: Abnormal   Collection Time: 02/02/15 10:35 AM  Result Value Ref Range   Sodium 142 135 - 145 mmol/L   Potassium 3.6 3.5 - 5.1 mmol/L   Chloride 110 96 - 112  mmol/L   CO2 27 19 - 32 mmol/L   Glucose, Bld 65 (L) 70 - 99 mg/dL   BUN 12 6 - 23 mg/dL   Creatinine, Ser 1.73 (H) 0.50 - 1.35 mg/dL   Calcium 8.2 (L) 8.4 - 10.5 mg/dL   Total Protein 6.5 6.0 - 8.3 g/dL   Albumin 1.6 (L) 3.5 - 5.2 g/dL   AST 18 0 - 37 U/L   ALT 20 0 - 53 U/L   Alkaline Phosphatase 112 39 - 117 U/L   Total Bilirubin 0.3 0.3 - 1.2 mg/dL   GFR calc non Af Amer 50 (L) >90 mL/min   GFR calc Af Amer 58 (L) >90 mL/min    Comment: (NOTE) The eGFR has been calculated using the CKD EPI equation. This calculation has not been validated in all clinical situations. eGFR's persistently <90 mL/min signify possible Chronic Kidney Disease.    Anion gap 5 5 - 15  Brain natriuretic peptide     Status: Abnormal   Collection Time: 02/02/15 10:35 AM  Result Value Ref Range   B Natriuretic Peptide 629.0 (H) 0.0 - 100.0 pg/mL  I-Stat Troponin, ED (not at [facility])     Status: None   Collection Time: 02/02/15 11:35 AM  Result Value Ref Range   Troponin i, poc 0.02 0.00 -  0.08 ng/mL   Comment 3            Comment: Due to the release kinetics of cTnI, a negative result within the first hours of the onset of symptoms does not rule out myocardial infarction with certainty. If myocardial infarction is still suspected, repeat the test at appropriate intervals.   CBG monitoring, ED     Status: Abnormal   Collection Time: 02/02/15 12:09 PM  Result Value Ref Range   Glucose-Capillary 43 (LL) 70 - 99 mg/dL  CBG monitoring, ED     Status: Abnormal   Collection Time: 02/02/15 12:34 PM  Result Value Ref Range   Glucose-Capillary 45 (L) 70 - 99 mg/dL  CBG monitoring, ED     Status: Abnormal   Collection Time: 02/02/15  1:17 PM  Result Value Ref Range   Glucose-Capillary 101 (H) 70 - 99 mg/dL    Dg Chest 2 View  02/02/2015   CLINICAL DATA:  left sided chest pain and SOB x today. Pt also c/o swelling of legs, arms and abdomen x a few weeks and a cough and congestion x a few days. Hx HTN controlled with medication, diabetes, and ex-smoker as of 01/09/15.  EXAM: CHEST - 2 VIEW  COMPARISON:  01/26/2015  FINDINGS: Patchy airspace opacities in both upper lobes, right greater than left, and at the left lung base. Left arm PICC line to the distal SVC. Small pleural effusions. Mild cardiomegaly stable. Visualized skeletal structures are unremarkable.  IMPRESSION: 1. New patchy airspace infiltrates in both upper lobes and at the left lung base, suggesting multifocal pneumonia versus atypical edema. 2. Stable cardiomegaly and small pleural effusions. 3. PICC line to the distal SVC.   Electronically Signed   By: Arne Cleveland M.D.   On: 02/02/2015 11:52    Review of Systems  Constitutional: Negative for fever and diaphoresis.  HENT: Positive for congestion. Negative for sore throat.   Respiratory: Positive for cough and shortness of breath.   Cardiovascular: Positive for orthopnea, claudication and leg swelling. Negative for chest pain.  Gastrointestinal: Negative for nausea,  vomiting, abdominal pain, blood in stool and melena.  Neurological: Negative  for dizziness.  All other systems reviewed and are negative.  Blood pressure 163/88, pulse 91, temperature 98.4 F (36.9 C), temperature source Oral, resp. rate 13, SpO2 97 %. Physical Exam  Nursing note and vitals reviewed. Constitutional: He is oriented to person, place, and time. He appears well-developed and well-nourished. No distress.  HENT:  Head: Normocephalic and atraumatic.  Eyes: EOM are normal. Pupils are equal, round, and reactive to light. No scleral icterus.  Neck: Normal range of motion. Neck supple.  Cardiovascular: Normal rate, regular rhythm, S1 normal and S2 normal.   Murmur heard.  Systolic murmur is present with a grade of 1/6  Pulses:      Radial pulses are 2+ on the right side, and 2+ on the left side.  Respiratory: Effort normal. He has no wheezes.  Decreased BS.  Mild rales  GI: Soft. Bowel sounds are normal. He exhibits distension. There is no tenderness.  Musculoskeletal: He exhibits edema.  3+ tense edema.   Lymphadenopathy:    He has no cervical adenopathy.  Neurological: He is alert and oriented to person, place, and time. He exhibits normal muscle tone.  Skin: Skin is warm and dry.  Psychiatric: He has a normal mood and affect.    Assessment/Plan: 35 year old male who was just discharged on 01/27/2015 with a diagnosis of mitral valve endocarditis with MSSA bacteremia.  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 The right shoulder with septic arthritis/synovitis.  01/11/2015 he had a right ABI that was okay and the left of 0.64 with unobtainable left TBI.  Echo Normal LV function. Moderate to severe LVH.  Presents with volume overload.    Principal Problem:   Acute diastolic CHF (congestive heart failure), NYHA class 3 Active Problems:   Essential hypertension, benign   Diabetes type 2, uncontrolled   Acute renal failure   Mitral regurgitation   Endocarditis   PVD (peripheral vascular disease)   Hypoalbuminemia   Anasarca  His acute CHF is likely a combination of diastolic DF, MR and hypoalbuminemia.  The patient will be admitted by IM.  We will start IV lasix at 66m BID.  Blood cultures.  New echo.  Fluid restriction.  Strict I/O.  Daily weight.  TCTS consult.      HTarri Fuller  PNorth [facility] LP2/03/2015, 2:39 PM     Patient examined chart reviewed.  Discussed need for hospitalization with patient and wife.  Reviewed cath, TTE and TEE.  He has perforated P3 posterior scallop with severe MR.  Other valves ok  PICC line getting home antibiotics.  Admitted with CHF Lasix iv bid  ECG ok No CAD on cath.  BC;s x 2 Repeat echo Dr ORicard Dillonservice called Consider moving surgery date up.  Exam remarkable for anasarca with plus 3 LE edema and basilar rales.  MR murmur not as impressive as you would think.  Not toxic appearing   PJenkins Rouge

## 2015-02-02 NOTE — ED Notes (Signed)
Pt presents to department for evaluation of L sided chest pain and SOB. Onset this morning. Pt has PICC line, receiving antibiotics at home. 9/10 pain upon arrival to ED. Pt is alert and oriented x4.

## 2015-02-02 NOTE — ED Notes (Signed)
CBG 101. Patient resting. ALert and oriented when awakened.

## 2015-02-02 NOTE — Progress Notes (Signed)
[address]       [place] [id number]             [phone]     CARDIOTHORACIC SURGERY PROGRESS NOTE  Subjective: Patient readmitted to hospital with worsening severe bilateral LE edema, abdominal swelling, SOB and orthopnea.  He still has significant right shoulder pain that has not improved.  He denies fever, chills, sweats.  He reports no other new significant complaints.  Objective: Vital signs in last 24 hours: Temp:  [98.2 F (36.8 C)-98.4 F (36.9 C)] 98.2 F (36.8 C) (02/03 1718) Pulse Rate:  [81-91] 89 (02/03 1630) Cardiac Rhythm:  [-] Normal sinus rhythm (02/03 1111) Resp:  [12-27] 24 (02/03 1630) BP: (160-183)/(88-102) 160/95 mmHg (02/03 1630) SpO2:  [94 %-99 %] 96 % (02/03 1630) Weight:  [119.704 kg (263 lb 14.4 oz)] 119.704 kg (263 lb 14.4 oz) (02/03 1718)  Physical Exam:  Rhythm:   sinus  Breath sounds: Fairly clear, symmetrical  Heart sounds:  RRR w/ soft systolic murmur  Incisions:  n/a  Abdomen:  Distended but soft and non tender  Extremities:  Warm, well-perfused, severe bilateral LE edema, + mild-moderate discomfort w/ right shoulder but adequate ROM   Intake/Output from previous day:   Intake/Output this shift:    Lab Results:  Recent Labs  02/02/15 1035  WBC 6.3  HGB 9.0*  HCT 27.2*  PLT 316   BMET:  Recent Labs  02/02/15 1035  NA 142  K 3.6  CL 110  CO2 27  GLUCOSE 65*  BUN 12  CREATININE 1.73*  CALCIUM 8.2*    CBG (last 3)   Recent Labs  02/02/15 1209 02/02/15 1234 02/02/15 1317  GLUCAP 43* 45* 101*   PT/INR:  No results for input(s): LABPROT, INR in the last 72 hours.  CXR:  CHEST - 2 VIEW  COMPARISON: 01/26/2015  FINDINGS: Patchy airspace opacities in both upper lobes, right greater than left, and at the left lung base. Left arm PICC line to the distal SVC. Small pleural effusions. Mild cardiomegaly stable. Visualized skeletal structures are unremarkable.  IMPRESSION: 1. New patchy  airspace infiltrates in both upper lobes and at the left lung base, suggesting multifocal pneumonia versus atypical edema. 2. Stable cardiomegaly and small pleural effusions. 3. PICC line to the distal SVC.   Electronically Signed  By: Oley Balmaniel Hassell M.D.  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  His previous hospital course was notable for acute non-oliguric renal failure that resolved without need for dialysis, although his serum creatinine never returned completely to baseline, raising the possibility that he might have embolized to one or both kidneys.  MRI of the brain revealed multiple small focal embolic infarcts c/w subclinical septic emboli to the brain.  He also has known septic arthritis of right shoulder that has been managed with only medical therapy thus far.  Because of the complex circumstances including the severity of mitral regurgitation and the patient's coexisting renal insufficiency with unresolved septic arthritis of the right shoulder, I recommended INPATIENT therapy to facilitate very close observation.  Because the patient remained stable and improved on medical therapy, it seemed wise to delay mitral valve repair/replacement until his infections had cleared to improve the chances for valve repair and decrease the likelihood  of PVE postoperatively.  The patient now returns only 5 days after hospital discharge with worsening fluid overload.  Renal function is stable.  He is not having fevers, blood counts are stable, and he does not appear toxic.  His shoulder pain has not improved.  I favor checking f/u transthoracic echocardiogram and possible repeat TEE if there is any question of new complicating features related to his  mitral valve endocarditis.  He clearly needs diuresis and his renal function will need to be monitored carefully.  He needs repeat blood cultures and f/u with the I.D. Team (I have spoken with Dr Daiva Eves).  He needs to be seen in f/u by Orthopedic Surgery and probably should have his shoulder washed out.  He will likely need mitral valve repair/replacement during this hospitalization, but not until his condition has been optimized.  Will continue to follow with you.  Discussed issues at length with the patient and his girlfriend.  All questions answered.   I spent in excess of 30 minutes during the conduct of this hospital encounter and >50% of this time involved direct face-to-face encounter with the patient for counseling and/or coordination of their care.   OWEN,CLARENCE H 02/02/2015 5:51 PM

## 2015-02-02 NOTE — ED Notes (Signed)
Patient returned from X-ray 

## 2015-02-02 NOTE — ED Notes (Signed)
Called Lab to add on BNP 

## 2015-02-02 NOTE — ED Notes (Signed)
Pt's Personal Antibiotics are started again at 28.3 ml/hr.

## 2015-02-02 NOTE — Progress Notes (Signed)
[facility]  Patient Status: Actie pt with [facility] prior to this readmission  [facility] is providing the following services:   HHRN and Home infusion Pharmacy for home IV ABX. [facility] will follow [name] while here and support transition home when ordered.  If patient discharges after hours, please call [phone].   Dwayne [name] 02/02/2015, 5:51 PM

## 2015-02-02 NOTE — ED Notes (Signed)
Dr. Effie Shywentz notified of pt CBG, told to encourage patient to eat pudding and jello. Pt eating meal tray at this time, in NAD.

## 2015-02-02 NOTE — ED Notes (Signed)
Patient was admitted a week ago with septic arthritis per family member. Patient has had increased swelling in his leg and scrotal area. The chest pain did not occure=

## 2015-02-02 NOTE — Progress Notes (Signed)
ANTIBIOTIC CONSULT NOTE - INITIAL  Pharmacy Consult for Ampicillin Indication: Endocarditis  No Known Allergies  Patient Measurements: TBW: 110kg  Vital Signs: Temp: 98.4 F (36.9 C) (02/03 1031) Temp Source: Oral (02/03 1031) BP: 164/99 mmHg (02/03 1545) Pulse Rate: 89 (02/03 1545) Intake/Output from previous day:   Intake/Output from this shift:    Labs:  Recent Labs  02/02/15 1035  WBC 6.3  HGB 9.0*  PLT 316  CREATININE 1.73*   Estimated Creatinine Clearance: 79.4 mL/min (by C-G formula based on Cr of 1.73). No results for input(s): VANCOTROUGH, VANCOPEAK, VANCORANDOM, GENTTROUGH, GENTPEAK, GENTRANDOM, TOBRATROUGH, TOBRAPEAK, TOBRARND, AMIKACINPEAK, AMIKACINTROU, AMIKACIN in the last 72 hours.   Microbiology: Recent Results (from the past 720 hour(s))  Urine culture     Status: None   Collection Time: 01/09/15  1:31 AM  Result Value Ref Range Status   Specimen Description URINE, CLEAN CATCH  Final   Special Requests NONE  Final   Colony Count   Final    3,000 COLONIES/ML Performed at [facility]    Culture   Final    INSIGNIFICANT GROWTH Performed at [facility]    Report Status 01/10/2015 FINAL  Final  Stool culture     Status: None   Collection Time: 01/09/15  4:09 AM  Result Value Ref Range Status   Specimen Description VAGINAL/RECTAL  Final   Special Requests Normal  Final   Culture   Final    NO SALMONELLA, SHIGELLA, CAMPYLOBACTER, YERSINIA, OR E.COLI 0157:H7 ISOLATED Performed at [facility]    Report Status 01/12/2015 FINAL  Final  Clostridium Difficile by PCR     Status: None   Collection Time: 01/09/15  4:09 AM  Result Value Ref Range Status   C difficile by pcr NEGATIVE NEGATIVE Final  Culture, blood (routine x 2)     Status: None   Collection Time: 01/10/15  4:35 AM  Result Value Ref Range Status   Specimen Description BLOOD RIGHT HAND  Final   Special Requests BOTTLES DRAWN AEROBIC AND ANAEROBIC 10CC EA   Final   Culture   Final    STAPHYLOCOCCUS AUREUS Note: SUSCEPTIBILITIES PERFORMED ON PREVIOUS CULTURE WITHIN THE LAST 5 DAYS. Note: Culture results may be compromised due to an excessive volume of blood received in culture bottles. Gram Stain Report Called to,Read Back By and Verified With:  MONICA YIM ON 01/10/2015 AT 11:36P BY WILEJ Performed at [facility]    Report Status 01/13/2015 FINAL  Final  Culture, blood (routine x 2)     Status: None   Collection Time: 01/10/15  4:46 AM  Result Value Ref Range Status   Specimen Description BLOOD LEFT ARM  Final   Special Requests BOTTLES DRAWN AEROBIC AND ANAEROBIC 10CC EA  Final   Culture   Final    STAPHYLOCOCCUS AUREUS Note: SUSCEPTIBILITIES PERFORMED ON PREVIOUS CULTURE WITHIN THE LAST 5 DAYS. Note: Culture results may be compromised due to an excessive volume of blood received in culture bottles. Gram Stain Report Called to,Read Back By and Verified With: MONICA YIM ON 01/10/2015 AT 11:36P BY Dennard Nip Performed at [facility]    Report Status 01/13/2015 FINAL  Final  Respiratory virus panel (routine influenza)     Status: None   Collection Time: 01/10/15 10:34 AM  Result Value Ref Range Status   Source - RVPAN NASAL SWAB  Corrected    Comment: CORRECTED ON 01/12 AT 2036: PREVIOUSLY REPORTED AS NASAL SWAB   Respiratory  Syncytial Virus A NOT DETECTED  Final   Respiratory Syncytial Virus B NOT DETECTED  Final   Influenza A NOT DETECTED  Final   Influenza B NOT DETECTED  Final   Parainfluenza 1 NOT DETECTED  Final   Parainfluenza 2 NOT DETECTED  Final   Parainfluenza 3 NOT DETECTED  Final   Metapneumovirus NOT DETECTED  Final   Rhinovirus NOT DETECTED  Final   Adenovirus NOT DETECTED  Final   Influenza A H1 NOT DETECTED  Final   Influenza A H3 NOT DETECTED  Final    Comment: (NOTE)       Normal Reference Range for each Analyte: NOT DETECTED Testing performed using the Luminex xTAG Respiratory Viral Panel  test kit. The analytical performance characteristics of this assay have been determined by [facility].  The modifications have not been cleared or approved by the FDA. This assay has been validated pursuant to the CLIA regulations and is used for clinical purposes. Performed at [facility], blood (routine x 2)     Status: None   Collection Time: 01/10/15 11:19 AM  Result Value Ref Range Status   Specimen Description BLOOD LEFT FOREARM  Final   Special Requests BOTTLES DRAWN AEROBIC ONLY 5CC  Final   Culture   Final    STAPHYLOCOCCUS AUREUS Note: RIFAMPIN AND GENTAMICIN SHOULD NOT BE USED AS SINGLE DRUGS FOR TREATMENT OF STAPH INFECTIONS. Note: Gram Stain Report Called to,Read Back By and Verified With: NURSE MONICA YIM 01/11/15 6:05AM THOMI Performed at [facility]    Report Status 01/13/2015 FINAL  Final   Organism ID, Bacteria STAPHYLOCOCCUS AUREUS  Final      Susceptibility   Staphylococcus aureus - MIC*    CLINDAMYCIN <=0.25 SENSITIVE Sensitive     ERYTHROMYCIN <=0.25 SENSITIVE Sensitive     GENTAMICIN <=0.5 SENSITIVE Sensitive     LEVOFLOXACIN 4 INTERMEDIATE Intermediate     OXACILLIN 0.5 SENSITIVE Sensitive     PENICILLIN 0.12 SENSITIVE Sensitive     RIFAMPIN 2 INTERMEDIATE Intermediate     TRIMETH/SULFA <=10 SENSITIVE Sensitive     VANCOMYCIN 1 SENSITIVE Sensitive     TETRACYCLINE <=1 SENSITIVE Sensitive     MOXIFLOXACIN 2 RESISTANT Resistant     * STAPHYLOCOCCUS AUREUS  Culture, blood (routine x 2)     Status: None   Collection Time: 01/10/15 11:27 AM  Result Value Ref Range Status   Specimen Description BLOOD LEFT ANTECUBITAL  Final   Special Requests BOTTLES DRAWN AEROBIC ONLY [place]  Final   Culture   Final    STAPHYLOCOCCUS AUREUS Note: SUSCEPTIBILITIES PERFORMED ON PREVIOUS CULTURE WITHIN THE LAST 5 DAYS. Note: Gram Stain Report Called to,Read Back By and Verified With: NURSE MONICA YIM 01/11/15 6:05AM THOMI Performed at  [facility]    Report Status 01/13/2015 FINAL  Final  Culture, blood (routine x 2)     Status: None   Collection Time: 01/13/15  7:20 PM  Result Value Ref Range Status   Specimen Description BLOOD RIGHT ARM  Final   Special Requests BOTTLES DRAWN AEROBIC AND ANAEROBIC 5CC  Final   Culture   Final    NO GROWTH 5 DAYS Performed at [facility]    Report Status 01/20/2015 FINAL  Final  Culture, blood (routine x 2)     Status: None   Collection Time: 01/13/15  7:20 PM  Result Value Ref Range Status   Specimen Description BLOOD RIGHT ARM  Final   Special Requests BOTTLES DRAWN AEROBIC AND ANAEROBIC 3CC  Final   Culture   Final    NO GROWTH 5 DAYS Performed at [facility]    Report Status 01/20/2015 FINAL  Final  Body fluid culture     Status: None   Collection Time: 01/14/15  9:02 AM  Result Value Ref Range Status   Specimen Description FLUID RIGHT SHOULDER SYNOVIAL  Final   Special Requests NONE  Final   Gram Stain   Final    FEW WBC PRESENT,BOTH PMN AND MONONUCLEAR NO ORGANISMS SEEN Performed at [facility]    Culture   Final    NO GROWTH 3 DAYS Performed at [facility]    Report Status 01/17/2015 FINAL  Final  Culture, blood (routine x 2)     Status: None   Collection Time: 01/18/15  6:10 PM  Result Value Ref Range Status   Specimen Description BLOOD LEFT ARM  Final   Special Requests BOTTLES DRAWN AEROBIC AND ANAEROBIC 10CC  Final   Culture   Final    NO GROWTH 5 DAYS Performed at [facility]    Report Status 01/25/2015 FINAL  Final  Culture, blood (routine x 2)     Status: None   Collection Time: 01/18/15  6:18 PM  Result Value Ref Range Status   Specimen Description BLOOD RIGHT ARM  Final   Special Requests BOTTLES DRAWN AEROBIC AND ANAEROBIC 10CC  Final   Culture   Final    NO GROWTH 5 DAYS Performed at [facility]    Report Status 01/25/2015 FINAL  Final  Surgical pcr screen     Status: None    Collection Time: 01/25/15  4:47 AM  Result Value Ref Range Status   MRSA, PCR NEGATIVE NEGATIVE Final   Staphylococcus aureus NEGATIVE NEGATIVE Final    Comment:        The Xpert SA Assay (FDA approved for NASAL specimens in patients over 42 years of age), is one component of a comprehensive surveillance program.  Test performance has been validated by [facility] for patients greater than or equal to 38 year old. It is not intended to diagnose infection nor to guide or monitor treatment.     Medical History: Past Medical History  Diagnosis Date  . Diabetes mellitus   . Hypertension   . Acute renal failure   . Bacteremia     MSSA   . Diabetes type 2, uncontrolled 01/09/2015  . Diabetic foot infection 01/15/2014  . 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 Essential hypertension, benign 01/18/2014  . Infective myositis of left thigh   . Noncompliance 01/18/2014  . Septic shock   . Smoker   . Staphylococcus aureus bacteremia with sepsis   . Tobacco abuse 01/15/2014  . Mitral regurgitation 01/13/2015    Severe by TEE    Medications:   (Not in a hospital admission)   Assessment: 35 yo M presents on 2/3 with chest pain. 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 Pharmacy to continue dosing ampicillin for endocarditis. Pt is afebrile and WBC wnl. SCr 1.73, CrCl ~46m/min.  Goal of Therapy:  Resolution of infection  Plan:  Start ampicillin 2g IV Q4 Monitor renal function, clinical picture  BReginia Naas2/03/2015,4:32 PM

## 2015-02-02 NOTE — ED Notes (Signed)
Unable to get blood cultures. MD approved stopping antibiotics for thirty minutes to get the first set of blood cultures. Antibiotics stopped at this time.

## 2015-02-02 NOTE — ED Provider Notes (Signed)
CSN: [id number]     Arrival date & time 02/02/15  1021 History   First MD Initiated Contact with Patient 02/02/15 1053     Chief Complaint  Patient presents with  . Chest Pain     (Consider location/radiation/quality/duration/timing/severity/associated sxs/prior Treatment) HPI   Dwayne [name] is a 35 y.o. male who presents for evaluation of left-sided chest pain, and swelling all over.  He was recently discharged from the hospital after a prolonged admission for mitral valve endocarditis, and had dental extractions.  During admission he had a coronary catheterization that showed normal coronary arteries.  He is being treated at home, with antibiotic infusions by PICC line.  He states his dental extraction site is feeling better.  He has had a cough productive of white sputum, for several days.  He denies fever, chills, shortness of breath, weakness or dizziness.  He has not been seen by a provider since hospital discharge.  There are no other known modifying factors.   Past Medical History  Diagnosis Date  . Diabetes mellitus   . Hypertension   . Acute renal failure   . Bacteremia     MSSA   . Diabetes type 2, uncontrolled 01/09/2015  . Diabetic foot infection 01/15/2014  . 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 Essential hypertension, benign 01/18/2014  . Infective myositis of left thigh   . Noncompliance 01/18/2014  . Septic shock   . Smoker   . Staphylococcus aureus bacteremia with sepsis   . Tobacco abuse 01/15/2014  . Mitral regurgitation 01/13/2015    Severe by TEE   Past Surgical History  Procedure Laterality Date  . I&d extremity Bilateral 01/16/2014    Procedure:  DEBRIDEMENT of bilateral foot ulcers;  Surgeon: Kathryne Hitch, MD;  Location: [facility] OR;  Service: Orthopedics;  Laterality: Bilateral;  . Tee without cardioversion N/A 01/13/2015    Procedure: TRANSESOPHAGEAL ECHOCARDIOGRAM (TEE);  Surgeon: Thurmon Fair, MD;  Location: [facility] ENDOSCOPY;  Service:  Cardiovascular;  Laterality: N/A;  . Left and right heart catheterization with coronary angiogram N/A 01/24/2015    Procedure: LEFT AND RIGHT HEART CATHETERIZATION WITH CORONARY ANGIOGRAM;  Surgeon: Peter M Swaziland, MD;  Location: [facility] CATH LAB;  Service: Cardiovascular;  Laterality: N/A;  . Multiple extractions with alveoloplasty N/A 01/25/2015    Procedure: Extraction of tooth #'s 1,2,3,4,5,6,8,9,11,12,13,14,15,16,17,18,19 28,31,32 with alveoloplasty and gross debridement of remaining teeth.;  Surgeon: Charlynne Pander, DDS;  Location: [facility] OR;  Service: Oral Surgery;  Laterality: N/A;   History reviewed. No pertinent family history. History  Substance Use Topics  . Smoking status: Current Every Day Smoker    Types: Cigars  . Smokeless tobacco: Never Used  . Alcohol Use: Yes    Review of Systems  All other systems reviewed and are negative.     Allergies  Review of patient's allergies indicates no known allergies.  Home Medications   Prior to Admission medications   Medication Sig Start Date End Date Taking? Authorizing Provider  ampicillin in sodium chloride 0.9 % 50 mL 12g continuous infusion via CAP pump with minimal fluid volume 01/28/15  Yes [name], DO  furosemide (LASIX) 40 MG tablet Take 0.5 tablets (20 mg total) by mouth daily. 01/28/15  Yes [name], DO  hydrALAZINE (APRESOLINE) 25 MG tablet Take 1 tablet (25 mg total) by mouth every 8 (eight) hours. 01/28/15  Yes [name], DO  insulin aspart protamine- aspart (NOVOLOG MIX 70/30) (70-30) 100 UNIT/ML injection Inject 0.14 mLs (14 Units total) into  the skin 2 (two) times daily with a meal. 01/28/15  Yes [name], DO  Insulin Syringe-Needle U-100 28G X 1/2" 0.5 ML MISC Use as directed. Any generic available. 01/28/15  Yes [name], DO  Lancets (FREESTYLE) lancets Any generic available. Use as instructed 01/28/15  Yes [name], DO  metoprolol tartrate (LOPRESSOR) 25 MG tablet Take 3 tablets (75  mg total) by mouth 2 (two) times daily. 01/28/15  Yes [name], DO  oxyCODONE (OXY IR/ROXICODONE) 5 MG immediate release tablet Take 1 tablet (5 mg total) by mouth every 4 (four) hours as needed for moderate pain or severe pain. 01/28/15  Yes [name], DO  Amino Acids-Protein Hydrolys (FEEDING SUPPLEMENT, PRO-STAT SUGAR FREE 64,) LIQD Take 30 mLs by mouth daily. Patient not taking: Reported on 02/02/2015 01/28/15   Edsel Petrin, DO  feeding supplement, GLUCERNA SHAKE, (GLUCERNA SHAKE) LIQD Take 237 mLs by mouth 2 (two) times daily between meals. Patient not taking: Reported on 02/02/2015 01/28/15   Nita Sells Mikhail, DO  nicotine (NICODERM CQ - DOSED IN MG/24 HR) 7 mg/24hr patch Place 1 patch (7 mg total) onto the skin daily. Patient not taking: Reported on 02/02/2015 01/28/15   Nita Sells Mikhail, DO  polyethylene glycol (MIRALAX / GLYCOLAX) packet Take 17 g by mouth daily. Patient not taking: Reported on 02/02/2015 01/28/15   [name], DO  senna-docusate (SENOKOT-S) 8.6-50 MG per tablet Take 2 tablets by mouth 2 (two) times daily. Patient not taking: Reported on 02/02/2015 01/28/15   [name], DO   BP 183/102 mmHg  Pulse 82  Temp(Src) 98.4 F (36.9 C) (Oral)  Resp 17  SpO2 96% Physical Exam  Constitutional: He is oriented to person, place, and time. He appears well-developed and well-nourished.  HENT:  Head: Normocephalic and atraumatic.  Right Ear: External ear normal.  Left Ear: External ear normal.  Eyes: Conjunctivae and EOM are normal. Pupils are equal, round, and reactive to light.  Neck: Normal range of motion and phonation normal. Neck supple.  Cardiovascular: Normal rate, regular rhythm and normal heart sounds.   Patient has anasarca  Pulmonary/Chest: Effort normal and breath sounds normal. He exhibits no bony tenderness.  Abdominal: Soft. There is no tenderness.  Musculoskeletal: Normal range of motion. He exhibits edema.  Neurological: He is alert and oriented to  person, place, and time. No cranial nerve deficit or sensory deficit. He exhibits normal muscle tone. Coordination normal.  Skin: Skin is warm, dry and intact.  Psychiatric: He has a normal mood and affect. His behavior is normal. Judgment and thought content normal.  Nursing note and vitals reviewed.   ED Course  Procedures (including critical care time)  Medications - No data to display  Patient Vitals for the past 24 hrs:  BP Temp Temp src Pulse Resp SpO2  02/02/15 1230 (!) 183/102 mmHg - - 82 17 96 %  02/02/15 1211 - - - 84 12 96 %  02/02/15 1210 177/93 mmHg - - - - -  02/02/15 1115 (!) 162/102 mmHg - - 82 18 96 %  02/02/15 1113 160/98 mmHg - - 82 22 96 %  02/02/15 1032 170/92 mmHg - - - - -  02/02/15 1031 - 98.4 F (36.9 C) Oral 81 18 99 %    13:00- cardiology consultation for evaluation of possible congestive heart failure.    - He was seen by Dr. Eden Emms, at 14:15- Dr. Eden Emms agreed with diagnosis of anasarca, and recommended blood cultures and admission.  He felt that  the patient may possibly need mitral valve replacement during this hospitalization.  1:07 PM Reevaluation with update and discussion. After initial assessment and treatment, an updated evaluation reveals he is eating heartily, at this time.  He ate earlier today, and took his usual medicine.  Hypoglycemia was treated with oral nutrition. Donyae Kilner L   2:27 PM-Consult complete with Hospitalist. Patient case explained and discussed. She agrees to admit patient for further evaluation and treatment. Call ended at 1435  Labs Review Labs Reviewed  CBC WITH DIFFERENTIAL/PLATELET - Abnormal; Notable for the following:    RBC 3.16 (*)    Hemoglobin 9.0 (*)    HCT 27.2 (*)    Eosinophils Relative 8 (*)    All other components within normal limits  COMPREHENSIVE METABOLIC PANEL - Abnormal; Notable for the following:    Glucose, Bld 65 (*)    Creatinine, Ser 1.73 (*)    Calcium 8.2 (*)    Albumin 1.6 (*)    GFR  calc non Af Amer 50 (*)    GFR calc Af Amer 58 (*)    All other components within normal limits  CBG MONITORING, ED - Abnormal; Notable for the following:    Glucose-Capillary 43 (*)    All other components within normal limits  CBG MONITORING, ED - Abnormal; Notable for the following:    Glucose-Capillary 45 (*)    All other components within normal limits  I-STAT TROPOININ, ED   Component     Latest Ref Rng 01/27/2015 01/28/2015 02/02/2015            WBC     4.0 - 10.5 K/uL 7.1 6.9 6.3  RBC     4.22 - 5.81 MIL/uL 3.28 (L) 3.23 (L) 3.16 (L)  Hemoglobin     13.0 - 17.0 g/dL 9.1 (L) 9.0 (L) 9.0 (L)  HCT     39.0 - 52.0 % 28.0 (L) 28.2 (L) 27.2 (L)  MCV     78.0 - 100.0 fL 85.4 87.3 86.1  MCH     26.0 - 34.0 pg 27.7 27.9 28.5  MCHC     30.0 - 36.0 g/dL 16.132.5 09.631.9 04.533.1  RDW     11.5 - 15.5 % 13.9 13.8 13.5  Platelets     150 - 400 K/uL 378 338 316  Neutrophils Relative %     43 - 77 %   66  NEUT#     1.7 - 7.7 K/uL   4.2  Lymphocytes Relative     12 - 46 %   19  Lymphocytes Absolute     0.7 - 4.0 K/uL   1.2  Monocytes Relative     3 - 12 %   6  Monocytes Absolute     0.1 - 1.0 K/uL   0.4  Eosinophils Relative     0 - 5 %   8 (H)  Eosinophils Absolute     0.0 - 0.7 K/uL   0.5  Basophils Relative     0 - 1 %   1  Basophils Absolute     0.0 - 0.1 K/uL   0.0   BUN  Date Value Ref Range Status  02/02/2015 12 6 - 23 mg/dL Final  40/98/119101/29/2016 13 6 - 23 mg/dL Final  47/82/956201/28/2016 18 6 - 23 mg/dL Final  13/08/657801/27/2016 22 6 - 23 mg/dL Final   CREAT  Date Value Ref Range Status  03/16/2014 1.24 0.50 - 1.35 mg/dL Final   CREATININE, SER  Date Value Ref Range Status  02/02/2015 1.73* 0.50 - 1.35 mg/dL Final  16/10/9603 5.40* 0.50 - 1.35 mg/dL Final  98/10/9146 8.29* 0.50 - 1.35 mg/dL Final  56/21/3086 5.78* 0.50 - 1.35 mg/dL Final      Imaging Review Dg Chest 2 View  02/02/2015   CLINICAL DATA:  left sided chest pain and SOB x today. Pt also c/o swelling of legs, arms  and abdomen x a few weeks and a cough and congestion x a few days. Hx HTN controlled with medication, diabetes, and ex-smoker as of 01/09/15.  EXAM: CHEST - 2 VIEW  COMPARISON:  01/26/2015  FINDINGS: Patchy airspace opacities in both upper lobes, right greater than left, and at the left lung base. Left arm PICC line to the distal SVC. Small pleural effusions. Mild cardiomegaly stable. Visualized skeletal structures are unremarkable.  IMPRESSION: 1. New patchy airspace infiltrates in both upper lobes and at the left lung base, suggesting multifocal pneumonia versus atypical edema. 2. Stable cardiomegaly and small pleural effusions. 3. PICC line to the distal SVC.   Electronically Signed   By: Oley Balm M.D.   On: 02/02/2015 11:52     EKG Interpretation   Date/Time:  Wednesday February 02 2015 10:28:08 EST Ventricular Rate:  80 PR Interval:  144 QRS Duration: 86 QT Interval:  408 QTC Calculation: 470 R Axis:   64 Text Interpretation:  Normal sinus rhythm Nonspecific T wave abnormality  Prolonged QT Abnormal ECG Since last tracing Non-specific ST-t changes  have improved Confirmed by Letrice Pollok  MD, Tequan Redmon ([id number]) on 02/02/2015 11:01:24  AM      MDM   Final diagnoses:  Anasarca  Cough  Hypoglycemia    Nonspecific swelling, with known recent cardiac ejection fraction 60%.  I suspect multifactorial volume overload.  Hemoglobin is stable from prior recent tests.  There is no clear clinical evidence for pneumonia.  He has persistent renal insufficiency with improving parameters.   Nursing Notes Reviewed/ Care Coordinated Applicable Imaging Reviewed Interpretation of Laboratory Data incorporated into ED treatment  Plan: Admit  Flint Melter, MD 02/03/15 1616

## 2015-02-02 NOTE — ED Notes (Addendum)
Patient's CBG 43. Patient given orange juice, peanut butter, and apple juice. Patient alert and oriented x4. Patient remains on the cardiac monitor. Patient diaphoretic upon arrival. MD made aware of the plan of care.

## 2015-02-02 NOTE — ED Notes (Signed)
Antibiotics stopped to obtain cultures.

## 2015-02-03 DIAGNOSIS — I76 Septic arterial embolism: Secondary | ICD-10-CM

## 2015-02-03 DIAGNOSIS — B9561 Methicillin susceptible Staphylococcus aureus infection as the cause of diseases classified elsewhere: Secondary | ICD-10-CM

## 2015-02-03 DIAGNOSIS — E1165 Type 2 diabetes mellitus with hyperglycemia: Secondary | ICD-10-CM

## 2015-02-03 DIAGNOSIS — I059 Rheumatic mitral valve disease, unspecified: Secondary | ICD-10-CM

## 2015-02-03 DIAGNOSIS — I309 Acute pericarditis, unspecified: Secondary | ICD-10-CM | POA: Insufficient documentation

## 2015-02-03 DIAGNOSIS — I739 Peripheral vascular disease, unspecified: Secondary | ICD-10-CM

## 2015-02-03 DIAGNOSIS — I340 Nonrheumatic mitral (valve) insufficiency: Secondary | ICD-10-CM

## 2015-02-03 DIAGNOSIS — M009 Pyogenic arthritis, unspecified: Secondary | ICD-10-CM | POA: Diagnosis present

## 2015-02-03 DIAGNOSIS — L03113 Cellulitis of right upper limb: Secondary | ICD-10-CM

## 2015-02-03 DIAGNOSIS — I10 Essential (primary) hypertension: Secondary | ICD-10-CM

## 2015-02-03 DIAGNOSIS — I319 Disease of pericardium, unspecified: Secondary | ICD-10-CM

## 2015-02-03 DIAGNOSIS — R601 Generalized edema: Secondary | ICD-10-CM

## 2015-02-03 DIAGNOSIS — M4644 Discitis, unspecified, thoracic region: Secondary | ICD-10-CM

## 2015-02-03 DIAGNOSIS — R7881 Bacteremia: Secondary | ICD-10-CM

## 2015-02-03 DIAGNOSIS — I5031 Acute diastolic (congestive) heart failure: Secondary | ICD-10-CM

## 2015-02-03 LAB — BASIC METABOLIC PANEL
Anion gap: 4 — ABNORMAL LOW (ref 5–15)
BUN: 13 mg/dL (ref 6–23)
CHLORIDE: 107 mmol/L (ref 96–112)
CO2: 28 mmol/L (ref 19–32)
Calcium: 7.8 mg/dL — ABNORMAL LOW (ref 8.4–10.5)
Creatinine, Ser: 1.78 mg/dL — ABNORMAL HIGH (ref 0.50–1.35)
GFR calc non Af Amer: 48 mL/min — ABNORMAL LOW (ref >90)
GFR, EST AFRICAN AMERICAN: 56 mL/min — AB (ref >90)
Glucose, Bld: 126 mg/dL — ABNORMAL HIGH (ref 70–99)
Potassium: 3.5 mmol/L (ref 3.5–5.1)
SODIUM: 139 mmol/L (ref 135–145)

## 2015-02-03 LAB — GLUCOSE, CAPILLARY
GLUCOSE-CAPILLARY: 145 mg/dL — AB (ref 70–99)
GLUCOSE-CAPILLARY: 140 mg/dL — AB (ref 70–99)
GLUCOSE-CAPILLARY: 138 mg/dL — AB (ref 70–99)
GLUCOSE-CAPILLARY: 167 mg/dL — AB (ref 70–99)
Glucose-Capillary: 132 mg/dL — ABNORMAL HIGH (ref 70–99)

## 2015-02-03 LAB — CBC
HCT: 24.9 % — ABNORMAL LOW (ref 39.0–52.0)
Hemoglobin: 8.2 g/dL — ABNORMAL LOW (ref 13.0–17.0)
MCH: 28 pg (ref 26.0–34.0)
MCHC: 32.9 g/dL (ref 30.0–36.0)
MCV: 85 fL (ref 78.0–100.0)
PLATELETS: 285 10*3/uL (ref 150–400)
RBC: 2.93 MIL/uL — AB (ref 4.22–5.81)
RDW: 13.6 % (ref 11.5–15.5)
WBC: 5.8 10*3/uL (ref 4.0–10.5)

## 2015-02-03 MED ORDER — ACETAMINOPHEN 325 MG PO TABS
650.0000 mg | ORAL_TABLET | Freq: Four times a day (QID) | ORAL | Status: DC | PRN
  Administered 2015-02-03: 650 mg via ORAL
  Filled 2015-02-03: qty 2

## 2015-02-03 MED ORDER — ENSURE COMPLETE PO LIQD
237.0000 mL | Freq: Three times a day (TID) | ORAL | Status: DC
  Administered 2015-02-03 – 2015-02-17 (×30): 237 mL via ORAL

## 2015-02-03 MED ORDER — ENSURE PUDDING PO PUDG: 1.0000 | Freq: Three times a day (TID) | ORAL | Status: DC

## 2015-02-03 MED ORDER — HYDRALAZINE HCL 50 MG PO TABS
50.0000 mg | ORAL_TABLET | Freq: Three times a day (TID) | ORAL | Status: DC
  Administered 2015-02-03 – 2015-02-06 (×10): 50 mg via ORAL
  Filled 2015-02-03 (×10): qty 1

## 2015-02-03 NOTE — Progress Notes (Signed)
[facility] for Infectious Disease    Subjective:   She readmitted with diffuse edema   Antibiotics:  Anti-infectives    Start     Dose/Rate Route Frequency Ordered Stop   02/02/15 1700  ampicillin (OMNIPEN) 2 g in sodium chloride 0.9 % 50 mL IVPB     2 g150 mL/hr over 20 Minutes Intravenous Every 4 hours 02/02/15 1638        Medications: Scheduled Meds: . ampicillin (OMNIPEN) IV  2 g Intravenous Q4H  . feeding supplement (ENSURE COMPLETE)  237 mL Oral TID WC  . furosemide  80 mg Intravenous BID  . heparin  5,000 Units Subcutaneous 3 times per day  . hydrALAZINE  50 mg Oral 3 times per day  . Influenza vac split quadrivalent PF  0.5 mL Intramuscular Tomorrow-1000  . insulin aspart  0-9 Units Subcutaneous TID WC  . metoprolol tartrate  100 mg Oral BID  . nitroGLYCERIN  0.5 inch Topical 4 times per day  . sodium chloride  3 mL Intravenous Q12H   Continuous Infusions:   PRN Meds:.acetaminophen, alum & mag hydroxide-simeth, guaiFENesin-dextromethorphan, hydrALAZINE, HYDROcodone-acetaminophen, magnesium hydroxide, ondansetron **OR** ondansetron (ZOFRAN) IV    Objective: Weight change:   Intake/Output Summary (Last 24 hours) at 02/03/15 1551 Last data filed at 02/03/15 1229  Gross per 24 hour  Intake   1107 ml  Output   1525 ml  Net   -418 ml   Blood pressure 149/83, pulse 78, temperature 98.6 F (37 C), temperature source Oral, resp. rate 19, height 6' 2"  (1.88 m), weight 263 lb 6.4 oz (119.477 kg), SpO2 97 %. Temp:  [97.7 F (36.5 C)-98.6 F (37 C)] 98.6 F (37 C) (02/04 1138) Pulse Rate:  [78-89] 78 (02/04 1138) Resp:  [18-24] 19 (02/04 1138) BP: (139-183)/(81-95) 149/83 mmHg (02/04 1138) SpO2:  [93 %-98 %] 97 % (02/04 1138) Weight:  [263 lb 6.4 oz (119.477 kg)-263 lb 14.4 oz (119.704 kg)] 263 lb 6.4 oz (119.477 kg) (02/04 0841)  Physical Exam: General: aox3  CV: Systolic murmur loudest at PMI Pulm: clear, no wheezes or rhonchi  Shoulder: less  tender than what I remember last month Neuro; non focal CBC:   CBC Latest Ref Rng 02/03/2015 02/02/2015 01/28/2015  WBC 4.0 - 10.5 K/uL 5.8 6.3 6.9  Hemoglobin 13.0 - 17.0 g/dL 8.2(L) 9.0(L) 9.0(L)  Hematocrit 39.0 - 52.0 % 24.9(L) 27.2(L) 28.2(L)  Platelets 150 - 400 K/uL 285 316 338     BMET  Recent Labs  02/02/15 1035 02/03/15 0550  NA 142 139  K 3.6 3.5  CL 110 107  CO2 27 28  GLUCOSE 65* 126*  BUN 12 13  CREATININE 1.73* 1.78*  CALCIUM 8.2* 7.8*     Liver Panel   Recent Labs  02/02/15 1035  PROT 6.5  ALBUMIN 1.6*  AST 18  ALT 20  ALKPHOS 112  BILITOT 0.3       Sedimentation Rate No results for input(s): ESRSEDRATE in the last 72 hours. C-Reactive Protein No results for input(s): CRP in the last 72 hours.  Micro Results: Recent Results (from the past 720 hour(s))  Urine culture     Status: None   Collection Time: 01/09/15  1:31 AM  Result Value Ref Range Status   Specimen Description URINE, CLEAN CATCH  Final   Special Requests NONE  Final   Colony Count   Final    3,000 COLONIES/ML Performed at [employer]  Final    INSIGNIFICANT GROWTH Performed at [facility]    Report Status 01/10/2015 FINAL  Final  Stool culture     Status: None   Collection Time: 01/09/15  4:09 AM  Result Value Ref Range Status   Specimen Description VAGINAL/RECTAL  Final   Special Requests Normal  Final   Culture   Final    NO SALMONELLA, SHIGELLA, CAMPYLOBACTER, YERSINIA, OR E.COLI 0157:H7 ISOLATED Performed at [facility]    Report Status 01/12/2015 FINAL  Final  Clostridium Difficile by PCR     Status: None   Collection Time: 01/09/15  4:09 AM  Result Value Ref Range Status   C difficile by pcr NEGATIVE NEGATIVE Final  Culture, blood (routine x 2)     Status: None   Collection Time: 01/10/15  4:35 AM  Result Value Ref Range Status   Specimen Description BLOOD RIGHT HAND  Final   Special Requests BOTTLES DRAWN AEROBIC  AND ANAEROBIC 10CC EA  Final   Culture   Final    STAPHYLOCOCCUS AUREUS Note: SUSCEPTIBILITIES PERFORMED ON PREVIOUS CULTURE WITHIN THE LAST 5 DAYS. Note: Culture results may be compromised due to an excessive volume of blood received in culture bottles. Gram Stain Report Called to,Read Back By and Verified With:  MONICA YIM ON 01/10/2015 AT 11:36P BY WILEJ Performed at [facility]    Report Status 01/13/2015 FINAL  Final  Culture, blood (routine x 2)     Status: None   Collection Time: 01/10/15  4:46 AM  Result Value Ref Range Status   Specimen Description BLOOD LEFT ARM  Final   Special Requests BOTTLES DRAWN AEROBIC AND ANAEROBIC 10CC EA  Final   Culture   Final    STAPHYLOCOCCUS AUREUS Note: SUSCEPTIBILITIES PERFORMED ON PREVIOUS CULTURE WITHIN THE LAST 5 DAYS. Note: Culture results may be compromised due to an excessive volume of blood received in culture bottles. Gram Stain Report Called to,Read Back By and Verified With: MONICA YIM ON 01/10/2015 AT 11:36P BY WILEJ Performed at [facility]    Report Status 01/13/2015 FINAL  Final  Respiratory virus panel (routine influenza)     Status: None   Collection Time: 01/10/15 10:34 AM  Result Value Ref Range Status   Source - RVPAN NASAL SWAB  Corrected    Comment: CORRECTED ON 01/12 AT 2036: PREVIOUSLY REPORTED AS NASAL SWAB   Respiratory Syncytial Virus A NOT DETECTED  Final   Respiratory Syncytial Virus B NOT DETECTED  Final   Influenza A NOT DETECTED  Final   Influenza B NOT DETECTED  Final   Parainfluenza 1 NOT DETECTED  Final   Parainfluenza 2 NOT DETECTED  Final   Parainfluenza 3 NOT DETECTED  Final   Metapneumovirus NOT DETECTED  Final   Rhinovirus NOT DETECTED  Final   Adenovirus NOT DETECTED  Final   Influenza A H1 NOT DETECTED  Final   Influenza A H3 NOT DETECTED  Final    Comment: (NOTE)       Normal Reference Range for each Analyte: NOT DETECTED Testing performed using the Luminex xTAG Respiratory  Viral Panel test kit. The analytical performance characteristics of this assay have been determined by [facility].  The modifications have not been cleared or approved by the FDA. This assay has been validated pursuant to the CLIA regulations and is used for clinical purposes. Performed at [facility], blood (routine x 2)     Status:  None   Collection Time: 01/10/15 11:19 AM  Result Value Ref Range Status   Specimen Description BLOOD LEFT FOREARM  Final   Special Requests BOTTLES DRAWN AEROBIC ONLY 5CC  Final   Culture   Final    STAPHYLOCOCCUS AUREUS Note: RIFAMPIN AND GENTAMICIN SHOULD NOT BE USED AS SINGLE DRUGS FOR TREATMENT OF STAPH INFECTIONS. Note: Gram Stain Report Called to,Read Back By and Verified With: NURSE MONICA YIM 01/11/15 6:05AM THOMI Performed at [facility]    Report Status 01/13/2015 FINAL  Final   Organism ID, Bacteria STAPHYLOCOCCUS AUREUS  Final      Susceptibility   Staphylococcus aureus - MIC*    CLINDAMYCIN <=0.25 SENSITIVE Sensitive     ERYTHROMYCIN <=0.25 SENSITIVE Sensitive     GENTAMICIN <=0.5 SENSITIVE Sensitive     LEVOFLOXACIN 4 INTERMEDIATE Intermediate     OXACILLIN 0.5 SENSITIVE Sensitive     PENICILLIN 0.12 SENSITIVE Sensitive     RIFAMPIN 2 INTERMEDIATE Intermediate     TRIMETH/SULFA <=10 SENSITIVE Sensitive     VANCOMYCIN 1 SENSITIVE Sensitive     TETRACYCLINE <=1 SENSITIVE Sensitive     MOXIFLOXACIN 2 RESISTANT Resistant     * STAPHYLOCOCCUS AUREUS  Culture, blood (routine x 2)     Status: None   Collection Time: 01/10/15 11:27 AM  Result Value Ref Range Status   Specimen Description BLOOD LEFT ANTECUBITAL  Final   Special Requests BOTTLES DRAWN AEROBIC ONLY [place]  Final   Culture   Final    STAPHYLOCOCCUS AUREUS Note: SUSCEPTIBILITIES PERFORMED ON PREVIOUS CULTURE WITHIN THE LAST 5 DAYS. Note: Gram Stain Report Called to,Read Back By and Verified With: NURSE MONICA YIM 01/11/15 6:05AM  THOMI Performed at [facility]    Report Status 01/13/2015 FINAL  Final  Culture, blood (routine x 2)     Status: None   Collection Time: 01/13/15  7:20 PM  Result Value Ref Range Status   Specimen Description BLOOD RIGHT ARM  Final   Special Requests BOTTLES DRAWN AEROBIC AND ANAEROBIC 5CC  Final   Culture   Final    NO GROWTH 5 DAYS Performed at [facility]    Report Status 01/20/2015 FINAL  Final  Culture, blood (routine x 2)     Status: None   Collection Time: 01/13/15  7:20 PM  Result Value Ref Range Status   Specimen Description BLOOD RIGHT ARM  Final   Special Requests BOTTLES DRAWN AEROBIC AND ANAEROBIC 3CC  Final   Culture   Final    NO GROWTH 5 DAYS Performed at [facility]    Report Status 01/20/2015 FINAL  Final  Body fluid culture     Status: None   Collection Time: 01/14/15  9:02 AM  Result Value Ref Range Status   Specimen Description FLUID RIGHT SHOULDER SYNOVIAL  Final   Special Requests NONE  Final   Gram Stain   Final    FEW WBC PRESENT,BOTH PMN AND MONONUCLEAR NO ORGANISMS SEEN Performed at [facility]    Culture   Final    NO GROWTH 3 DAYS Performed at [facility]    Report Status 01/17/2015 FINAL  Final  Culture, blood (routine x 2)     Status: None   Collection Time: 01/18/15  6:10 PM  Result Value Ref Range Status   Specimen Description BLOOD LEFT ARM  Final   Special Requests BOTTLES DRAWN AEROBIC AND ANAEROBIC 10CC  Final   Culture   Final  NO GROWTH 5 DAYS Performed at [facility]    Report Status 01/25/2015 FINAL  Final  Culture, blood (routine x 2)     Status: None   Collection Time: 01/18/15  6:18 PM  Result Value Ref Range Status   Specimen Description BLOOD RIGHT ARM  Final   Special Requests BOTTLES DRAWN AEROBIC AND ANAEROBIC 10CC  Final   Culture   Final    NO GROWTH 5 DAYS Performed at [facility]    Report Status 01/25/2015 FINAL  Final  Surgical pcr screen      Status: None   Collection Time: 01/25/15  4:47 AM  Result Value Ref Range Status   MRSA, PCR NEGATIVE NEGATIVE Final   Staphylococcus aureus NEGATIVE NEGATIVE Final    Comment:        The Xpert SA Assay (FDA approved for NASAL specimens in patients over 75 years of age), is one component of a comprehensive surveillance program.  Test performance has been validated by [facility] for patients greater than or equal to 20 year old. It is not intended to diagnose infection nor to guide or monitor treatment.   Culture, blood (routine x 2)     Status: None (Preliminary result)   Collection Time: 02/02/15  3:53 PM  Result Value Ref Range Status   Specimen Description BLOOD PICC LINE  Final   Special Requests BOTTLES DRAWN AEROBIC AND ANAEROBIC 5ML  Final   Culture   Final           BLOOD CULTURE RECEIVED NO GROWTH TO DATE CULTURE WILL BE HELD FOR 5 DAYS BEFORE ISSUING A FINAL NEGATIVE REPORT Performed at [facility]    Report Status PENDING  Incomplete  Culture, blood (routine x 2)     Status: None (Preliminary result)   Collection Time: 02/02/15  4:32 PM  Result Value Ref Range Status   Specimen Description BLOOD PICC LINE  Final   Special Requests BOTTLES DRAWN AEROBIC AND ANAEROBIC 10CC  Final   Culture   Final           BLOOD CULTURE RECEIVED NO GROWTH TO DATE CULTURE WILL BE HELD FOR 5 DAYS BEFORE ISSUING A FINAL NEGATIVE REPORT Performed at [facility]    Report Status PENDING  Incomplete  MRSA PCR Screening     Status: None   Collection Time: 02/02/15  5:14 PM  Result Value Ref Range Status   MRSA by PCR NEGATIVE NEGATIVE Final    Comment:        The GeneXpert MRSA Assay (FDA approved for NASAL specimens only), is one component of a comprehensive MRSA colonization surveillance program. It is not intended to diagnose MRSA infection nor to guide or monitor treatment for MRSA infections.     Studies/Results: Dg Chest 2 View  02/02/2015    CLINICAL DATA:  left sided chest pain and SOB x today. Pt also c/o swelling of legs, arms and abdomen x a few weeks and a cough and congestion x a few days. Hx HTN controlled with medication, diabetes, and ex-smoker as of 01/09/15.  EXAM: CHEST - 2 VIEW  COMPARISON:  01/26/2015  FINDINGS: Patchy airspace opacities in both upper lobes, right greater than left, and at the left lung base. Left arm PICC line to the distal SVC. Small pleural effusions. Mild cardiomegaly stable. Visualized skeletal structures are unremarkable.  IMPRESSION: 1. New patchy airspace infiltrates in both upper lobes and at the left lung base, suggesting multifocal  pneumonia versus atypical edema. 2. Stable cardiomegaly and small pleural effusions. 3. PICC line to the distal SVC.   Electronically Signed   By: Arne Cleveland M.D.   On: 02/02/2015 11:52      Assessment/Plan:  Principal Problem:   Acute diastolic CHF (congestive heart failure), NYHA class 3 Active Problems:   Essential hypertension, benign   Diabetes type 2, uncontrolled   Acute renal failure   Mitral regurgitation   Endocarditis   PVD (peripheral vascular disease)   Hypoalbuminemia   Anasarca    Dwayne [name] is a 35 y.o. 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 Repeat TEE  --if he is having further valvular heart failure due to his endocardiis, may need more urgent Heart valve repair     LOS: 1 day   Alcide Evener 02/03/2015, 3:51 PM

## 2015-02-03 NOTE — Care Management Note (Signed)
Page 1 of 2   02/25/2015     12:50:18 PM CARE MANAGEMENT NOTE 02/25/2015  Patient:  BILAAL, LEIB   Account Number:  [id number]  Date Initiated:  02/03/2015  Documentation initiated by:  GRAVES-BIGELOW,BRENDA  Subjective/Objective Assessment:   Pt admitted for worsening edema up to his waist and scrotum with progressive SOB and orthopnea. Pt is from home with wife. Active with [facility] for IV ABX therapy. Has PICC line.     Action/Plan:   Pt was previously d/c and has no PCP or insurance. [facility] set up for 02-07-15. CM to reschedule if needs to. CM to monitor for additional needs.   Anticipated DC Date:  02/25/2015   Anticipated DC Plan:  HOME W HOME HEALTH SERVICES      DC Planning Services  CM consult  Follow-up appt scheduled  [facility]      [facility] Choice  Resumption Of Svcs/PTA Provider   Choice offered to / List presented to:     DME arranged  Levan Hurst      DME agency  [facility]     [facility] arranged  HH-1 RN  IV Antibiotics      HH agency  [facility]   Status of service:  Completed, signed off Medicare Important Message given?  NO (If response is "NO", the following Medicare IM given date fields will be blank) Date Medicare IM given:   Medicare IM given by:   Date Additional Medicare IM given:   Additional Medicare IM given by:    Discharge Disposition:  HOME W HOME HEALTH SERVICES  Per UR Regulation:  Reviewed for med. necessity/level of care/duration of stay  If discussed at Long Length of Stay Meetings, dates discussed:   02/10/2015  02/17/2015  02/24/2015    Comments:  [facility] Pain Mother   [phone]    Gunawan,[name] Spouse   [phone]  02/25/15- 1200- Donn Pierini RN, BSN [phone] Pt for possible d/c later today, spoke with [facility]) they can see pt this afternoon to set up home IV abx, have arranged f/u appointment with [facility] for Mar. 8 at 9:00. pt would like a RW for  home per cardiac rehab- order placed and call made to [facility] with [facility] regarding DME need- RW to be delivered to room prior to discharge. Pt to go home with HH-RN with [facility] for home IV abx needs.  02/23/15- 1630- Donn Pierini RN, BSN [phone] Per ID note pt will need IV abx through 03/31/15- spoke with [facility]) who is following pt for IV abx d/c needs- plan is for pt to have a tunneled IJ cath placed- (trying to save arm veins for possible future HD needs) will f/u in am- pt could possible be ready for d/c tomorrow or friday.  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  Progressed to TCU unit today.  Continues on IV antibiotics.  On 25 days. Plan for 35 days- but appears may not need that long, ID to talk with surgery.  Is already a patient of [facility] with IV antibiotics.  CM will continue to follow.  02-16-15 1525 Tomi Bamberger, RN BSN [phone] Monitoring Cr- @ 2.60 today per MD notes Cr needs to be closer to 2 before MVR. CM will continue to monitor.    02-14-15 [address], [place] [phone] Pt  with increased Cr 3.5- Renal following and recommends Cr to be at 2 before MVR. Surgery possible Wed. CM to monitor for disposition needs.   02-09-15 Tomi BambergerBrenda Graves-Bigelow, RN, BSN [phone] Pt continues with IV Lasix. Plan for possible MVR 02-14-15. CM will continue to follow.   02-08-15 [address]1512 Tomi BambergerBrenda Graves-Bigelow, [place] [phone] CM to call the [facility] once pt is medically stable for d/c for Hospital F/u appointment. Pt missed recent appointment 02-07-15 since here in hospital. CM will continue to f/u.

## 2015-02-03 NOTE — Progress Notes (Signed)
UR Completed Javell Blackburn Graves-Bigelow, RN,BSN [phone]  

## 2015-02-03 NOTE — Progress Notes (Signed)
PROGRESS NOTE  Dwayne HickmanBenjamin [name] WUJ:[id number]:[id number] DOB: 04/25/1980 DOA: 02/02/2015 PCP: No PCP Per Patient  HPI/Recap of past 24 hours: States feeling better, denies shoulder pain at this moment. Sitting on the edge of bed playing tablet.  Assessment/Plan: Principal Problem:   Acute diastolic CHF (congestive heart failure), NYHA class 3 Active Problems:   Essential hypertension, benign   Diabetes type 2, uncontrolled   Acute renal failure   Mitral regurgitation   Endocarditis   PVD (peripheral vascular disease)   Hypoalbuminemia   Anasarca   Arthritis, septic, shoulder   Acute pericarditis 1. Acute on chronic diastolic dysfunction EF 60%. 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 stepdown unit, IV Lasix 80 mg twice a day one dose now, nitro paste, continue beta blocker, R [employer] board. Cardura 6 surgery consulted by cardiology. Blood cultures drawn to make sure no ongoing bacteremia. Continue IV ampicillin dosed by pharmacy for now. Infectious disease consulted. Has left arm PICC line. Supportive care with oxygen and nebulizer treatments.   2. Dental caries. Multiple teeth removed one week ago by an to surgery.   3. Right shoulder septic arthritis. Seen by orthopedics last admission. IV antibiotics. No surgical intervention planned by orthopedics. Clinically improved. Mri shoulder on 2/4 to r/o shoulder joint infection   4. DM type II. Last A1c was above 8. A new home dose 70-30, add sliding scale.    5. Essential hypertension. Home medications to continue. Add Nitropaste and as needed IV hydrazine for better control.   6. Chronic kidney disease stage IV. Baseline creatinine around 2. Currently at or better than baseline.  Code Status: full  Family Communication: patient  Disposition Plan: remain in patient   Consultants:  Cardiology/cardiothoracic surgery/infectious disease  Procedures:  none  Antibiotics:  ampicillin   Objective: BP 141/72 mmHg   Pulse 77  Temp(Src) 98.9 F (37.2 C) (Oral)  Resp 18  Ht 6\' 2"  (1.88 m)  Wt 119.477 kg (263 lb 6.4 oz)  BMI 33.80 kg/m2  SpO2 94%  Intake/Output Summary (Last 24 hours) at 02/03/15 1705 Last data filed at 02/03/15 1530  Gross per 24 hour  Intake   1107 ml  Output   1675 ml  Net   -568 ml   Filed Weights   02/02/15 1718 02/03/15 0500 02/03/15 0841  Weight: 119.704 kg (263 lb 14.4 oz) 119.659 kg (263 lb 12.8 oz) 119.477 kg (263 lb 6.4 oz)    Exam:   General:  Aaox3, NAD  Cardiovascular: RRR, ? murmur  Respiratory: decreased at bases, no wheezing/no rhonchi/ rales?  Abdomen: soft/NT/ND, positive bowel sounds  Musculoskeletal: pitting edema bilateral  Data Reviewed: Basic Metabolic Panel:  Recent Labs Lab 01/28/15 0451 02/02/15 1035 02/03/15 0550  NA 138 142 139  K 3.8 3.6 3.5  CL 105 110 107  CO2 29 27 28   GLUCOSE 187* 65* 126*  BUN 13 12 13   CREATININE 2.07* 1.73* 1.78*  CALCIUM 8.3* 8.2* 7.8*   Liver Function Tests:  Recent Labs Lab 02/02/15 1035  AST 18  ALT 20  ALKPHOS 112  BILITOT 0.3  PROT 6.5  ALBUMIN 1.6*   No results for input(s): LIPASE, AMYLASE in the last 168 hours. No results for input(s): AMMONIA in the last 168 hours. CBC:  Recent Labs Lab 01/28/15 0451 02/02/15 1035 02/03/15 0550  WBC 6.9 6.3 5.8  NEUTROABS  --  4.2  --   HGB 9.0* 9.0* 8.2*  HCT 28.2* 27.2* 24.9*  MCV 87.3 86.1 85.0  PLT 338 316 285   Cardiac Enzymes:   No results for input(s): CKTOTAL, CKMB, CKMBINDEX, TROPONINI in the last 168 hours. BNP (last 3 results)  Recent Labs  01/17/15 0535 01/22/15 0347 02/02/15 1035  BNP 315.1* 218.9* 629.0*    ProBNP (last 3 results) No results for input(s): PROBNP in the last 8760 hours.  CBG:  Recent Labs Lab 02/02/15 1820 02/02/15 2059 02/03/15 0737 02/03/15 1137 02/03/15 1630  GLUCAP 149* 145* 140* 138* 132*    Recent Results (from the past 240 hour(s))  Surgical pcr screen     Status: None    Collection Time: 01/25/15  4:47 AM  Result Value Ref Range Status   MRSA, PCR NEGATIVE NEGATIVE Final   Staphylococcus aureus NEGATIVE NEGATIVE Final    Comment:        The Xpert SA Assay (FDA approved for NASAL specimens in patients over 71 years of age), is one component of a comprehensive surveillance program.  Test performance has been validated by [facility] for patients greater than or equal to 48 year old. It is not intended to diagnose infection nor to guide or monitor treatment.   Culture, blood (routine x 2)     Status: None (Preliminary result)   Collection Time: 02/02/15  3:53 PM  Result Value Ref Range Status   Specimen Description BLOOD PICC LINE  Final   Special Requests BOTTLES DRAWN AEROBIC AND ANAEROBIC  Final   Culture   Final           BLOOD CULTURE RECEIVED NO GROWTH TO DATE CULTURE WILL BE HELD FOR 5 DAYS BEFORE ISSUING A FINAL NEGATIVE REPORT Performed at [facility]    Report Status PENDING  Incomplete  Culture, blood (routine x 2)     Status: None (Preliminary result)   Collection Time: 02/02/15  4:32 PM  Result Value Ref Range Status   Specimen Description BLOOD PICC LINE  Final   Special Requests BOTTLES DRAWN AEROBIC AND ANAEROBIC 10CC  Final   Culture   Final           BLOOD CULTURE RECEIVED NO GROWTH TO DATE CULTURE WILL BE HELD FOR 5 DAYS BEFORE ISSUING A FINAL NEGATIVE REPORT Performed at [facility]    Report Status PENDING  Incomplete  MRSA PCR Screening     Status: None   Collection Time: 02/02/15  5:14 PM  Result Value Ref Range Status   MRSA by PCR NEGATIVE NEGATIVE Final    Comment:        The GeneXpert MRSA Assay (FDA approved for NASAL specimens only), is one component of a comprehensive MRSA colonization surveillance program. It is not intended to diagnose MRSA infection nor to guide or monitor treatment for MRSA infections.      Studies: Dg Chest 2 View  02/02/2015   CLINICAL DATA:  left sided  chest pain and SOB x today. Pt also c/o swelling of legs, arms and abdomen x a few weeks and a cough and congestion x a few days. Hx HTN controlled with medication, diabetes, and ex-smoker as of 01/09/15.  EXAM: CHEST - 2 VIEW  COMPARISON:  01/26/2015  FINDINGS: Patchy airspace opacities in both upper lobes, right greater than left, and at the left lung base. Left arm PICC line to the distal SVC. Small pleural effusions. Mild cardiomegaly stable. Visualized skeletal structures are unremarkable.  IMPRESSION: 1. New patchy airspace infiltrates in both upper lobes and at the left lung base, suggesting  multifocal pneumonia versus atypical edema. 2. Stable cardiomegaly and small pleural effusions. 3. PICC line to the distal SVC.   Electronically Signed   By: Oley Balm M.D.   On: 02/02/2015 11:52    Scheduled Meds: . ampicillin (OMNIPEN) IV  2 g Intravenous Q4H  . feeding supplement (ENSURE COMPLETE)  237 mL Oral TID WC  . furosemide  80 mg Intravenous BID  . heparin  5,000 Units Subcutaneous 3 times per day  . hydrALAZINE  50 mg Oral 3 times per day  . Influenza vac split quadrivalent PF  0.5 mL Intramuscular Tomorrow-1000  . insulin aspart  0-9 Units Subcutaneous TID WC  . metoprolol tartrate  100 mg Oral BID  . nitroGLYCERIN  0.5 inch Topical 4 times per day  . sodium chloride  3 mL Intravenous Q12H    Continuous Infusions:      Makaria Poarch  [facility] Pager [phone]. If 7PM-7AM, please contact night-coverage at [email or web address], password [facility] 02/03/2015, 5:05 PM  LOS: 1 day

## 2015-02-03 NOTE — Progress Notes (Signed)
Subjective: No improvement in dyspnea  Objective: Vital signs in last 24 hours: Temp:  [97.7 F (36.5 C)-98.4 F (36.9 C)] 98.2 F (36.8 C) (02/04 0741) Pulse Rate:  [79-91] 80 (02/04 0741) Resp:  [12-27] 20 (02/04 0741) BP: (142-183)/(81-102) 183/95 mmHg (02/04 0741) SpO2:  [93 %-99 %] 93 % (02/04 0741) Weight:  [263 lb 6.4 oz (119.477 kg)-263 lb 14.4 oz (119.704 kg)] 263 lb 6.4 oz (119.477 kg) (02/04 0841) Last BM Date: 01/28/15  Intake/Output from previous day: 02/03 0701 - 02/04 0700 In: 290 [P.O.:240; IV Piggyback:50] Out: 400 [Urine:400] Intake/Output this shift: Total I/O In: 240 [P.O.:240] Out: 150 [Urine:150]  Medications Current Facility-Administered Medications  Medication Dose Route Frequency Provider Last Rate Last Dose  . alum & mag hydroxide-simeth (MAALOX/MYLANTA) 200-200-20 MG/5ML suspension 30 mL  30 mL Oral Q6H PRN Leroy Sea, MD      . ampicillin (OMNIPEN) 2 g in sodium chloride 0.9 % 50 mL IVPB  2 g Intravenous Q4H Para March Batchelder, RPH   2 g at 02/03/15 2130  . furosemide (LASIX) injection 80 mg  80 mg Intravenous BID Dwana Melena, PA-C   80 mg at 02/03/15 8657  . guaiFENesin-dextromethorphan (ROBITUSSIN DM) 100-10 MG/5ML syrup 5 mL  5 mL Oral Q4H PRN Leroy Sea, MD   5 mL at 02/03/15 0520  . heparin injection 5,000 Units  5,000 Units Subcutaneous 3 times per day Leroy Sea, MD   5,000 Units at 02/03/15 0540  . hydrALAZINE (APRESOLINE) injection 10 mg  10 mg Intravenous Q6H PRN Leroy Sea, MD   10 mg at 02/03/15 0820  . hydrALAZINE (APRESOLINE) tablet 25 mg  25 mg Oral 3 times per day Leroy Sea, MD   25 mg at 02/03/15 0539  . HYDROcodone-acetaminophen (NORCO/VICODIN) 5-325 MG per tablet 1-2 tablet  1-2 tablet Oral Q4H PRN Leroy Sea, MD   2 tablet at 02/03/15 [phone]  . Influenza vac split quadrivalent PF (FLUARIX) injection 0.5 mL  0.5 mL Intramuscular Tomorrow-1000 Leroy Sea, MD      . insulin aspart  (novoLOG) injection 0-9 Units  0-9 Units Subcutaneous TID WC Leroy Sea, MD   1 Units at 02/03/15 0813  . magnesium hydroxide (MILK OF MAGNESIA) suspension 30 mL  30 mL Oral Daily PRN Leroy Sea, MD      . metoprolol (LOPRESSOR) tablet 100 mg  100 mg Oral BID Leroy Sea, MD   100 mg at 02/02/15 1827  . nitroGLYCERIN (NITROGLYN) 2 % ointment 0.5 inch  0.5 inch Topical 4 times per day Leroy Sea, MD   0.5 inch at 02/03/15 0535  . ondansetron (ZOFRAN) tablet 4 mg  4 mg Oral Q6H PRN Leroy Sea, MD       Or  . ondansetron (ZOFRAN) injection 4 mg  4 mg Intravenous Q6H PRN Leroy Sea, MD      . sodium chloride 0.9 % injection 3 mL  3 mL Intravenous Q12H Leroy Sea, MD   3 mL at 02/02/15 2200    PE: General appearance: alert, cooperative and no distress Lungs: Decreased BS,  Rales Heart: regular rate and rhythm and 1/6 sys MM Extremities: 3+ LEE Pulses: 2+ and symmetric Skin: Warm and dry Neurologic: Grossly normal  Lab Results:   Recent Labs  02/02/15 1035 02/03/15 0550  WBC 6.3 5.8  HGB 9.0* 8.2*  HCT 27.2* 24.9*  PLT 316 285   BMET  Recent Labs  02/02/15 1035 02/03/15 0550  NA 142 139  K 3.6 3.5  CL 110 107  CO2 27 28  GLUCOSE 65* 126*  BUN 12 13  CREATININE 1.73* 1.78*  CALCIUM 8.2* 7.8*    Assessment/Plan  Principal Problem:   Acute diastolic CHF (congestive heart failure), NYHA class 3 Net fluids:  -0.1ML  Needs strict I/O's.  Limit fluid intake to 1200ml.  Wt 263 which is 21# higher than discharge wt.  The patient reports increased UOP but he may be drinking a lot.  I discussed limiting this.    Essential hypertension, benign  Poorly controlled.  Hydralazine 25 Q8,--increase to 50mg  Q8 today.  Lopressor 100BID, NTG ointment Q6.     Diabetes type 2, uncontrolled  Per IM   Acute renal failure  SCr stable at 1.78.     Mitral regurgitation  Seen by Dr. Dillon Bjorkwen(see note)   Endocarditis  IV ampicillin.  Blood cultures  pending.     PVD (peripheral vascular disease): LLE ABI 0.64   Hypoalbuminemia  Needs high protein diet.  Will add ensure.    Anasarca   Right shoulder septic arthritis.  Per ortho     LOS: 1 day    HAGER, BRYAN PA-C 02/03/2015 8:45 AM  Patient seen and examined and history reviewed. Agree with above findings and plan. Patient is feeling OK this am. Shoulder pain is not bad. Breathing is better. Still quite edematous. Repeat cultures are pending. Will repeat Echo. Continue diuresis. Monitor renal function closely.  Hakan Nudelman [place], MDFACC 02/03/2015 9:54 AM

## 2015-02-04 ENCOUNTER — Inpatient Hospital Stay (HOSPITAL_COMMUNITY): Payer: Medicaid Other

## 2015-02-04 ENCOUNTER — Encounter: Payer: Self-pay | Admitting: Physician Assistant

## 2015-02-04 DIAGNOSIS — R0600 Dyspnea, unspecified: Secondary | ICD-10-CM

## 2015-02-04 DIAGNOSIS — M25511 Pain in right shoulder: Secondary | ICD-10-CM | POA: Insufficient documentation

## 2015-02-04 DIAGNOSIS — M009 Pyogenic arthritis, unspecified: Secondary | ICD-10-CM

## 2015-02-04 DIAGNOSIS — I340 Nonrheumatic mitral (valve) insufficiency: Secondary | ICD-10-CM

## 2015-02-04 LAB — CBC
HCT: 25.2 % — ABNORMAL LOW (ref 39.0–52.0)
Hemoglobin: 8.1 g/dL — ABNORMAL LOW (ref 13.0–17.0)
MCH: 28.1 pg (ref 26.0–34.0)
MCHC: 32.1 g/dL (ref 30.0–36.0)
MCV: 87.5 fL (ref 78.0–100.0)
PLATELETS: 292 10*3/uL (ref 150–400)
RBC: 2.88 MIL/uL — AB (ref 4.22–5.81)
RDW: 13.8 % (ref 11.5–15.5)
WBC: 4.6 10*3/uL (ref 4.0–10.5)

## 2015-02-04 LAB — TSH: TSH: 2.046 u[IU]/mL (ref 0.350–4.500)

## 2015-02-04 LAB — GLUCOSE, CAPILLARY
GLUCOSE-CAPILLARY: 121 mg/dL — AB (ref 70–99)
GLUCOSE-CAPILLARY: 183 mg/dL — AB (ref 70–99)
Glucose-Capillary: 128 mg/dL — ABNORMAL HIGH (ref 70–99)
Glucose-Capillary: 184 mg/dL — ABNORMAL HIGH (ref 70–99)
Glucose-Capillary: 157 mg/dL — ABNORMAL HIGH (ref 70–99)

## 2015-02-04 LAB — BASIC METABOLIC PANEL
ANION GAP: 8 (ref 5–15)
BUN: 16 mg/dL (ref 6–23)
CHLORIDE: 106 mmol/L (ref 96–112)
CO2: 27 mmol/L (ref 19–32)
CREATININE: 1.81 mg/dL — AB (ref 0.50–1.35)
Calcium: 7.8 mg/dL — ABNORMAL LOW (ref 8.4–10.5)
GFR calc Af Amer: 55 mL/min — ABNORMAL LOW (ref >90)
GFR, EST NON AFRICAN AMERICAN: 47 mL/min — AB (ref >90)
Glucose, Bld: 142 mg/dL — ABNORMAL HIGH (ref 70–99)
POTASSIUM: 3.6 mmol/L (ref 3.5–5.1)
Sodium: 141 mmol/L (ref 135–145)

## 2015-02-04 LAB — HEMOGLOBIN A1C
Hgb A1c MFr Bld: 7.5 % — ABNORMAL HIGH (ref 4.8–5.6)
MEAN PLASMA GLUCOSE: 169 mg/dL

## 2015-02-04 LAB — MAGNESIUM: Magnesium: 1.9 mg/dL (ref 1.5–2.5)

## 2015-02-04 MED ORDER — IOHEXOL 180 MG/ML  SOLN
20.0000 mL | Freq: Once | INTRAMUSCULAR | Status: AC | PRN
  Administered 2015-02-04: 0.5 mL via INTRATHECAL

## 2015-02-04 NOTE — Progress Notes (Signed)
[address]       [name] [address]             [phone]     CARDIOTHORACIC SURGERY PROGRESS NOTE   Subjective: Reports feeling better.  Denies SOB.  Mild right shoulder discomfort  Objective: Vital signs in last 24 hours: Temp:  [98.5 F (36.9 C)-99.2 F (37.3 C)] 99.2 F (37.3 C) (02/05 1258) Pulse Rate:  [74-87] 74 (02/05 1258) Cardiac Rhythm:  [-] Normal sinus rhythm (02/05 0730) Resp:  [18] 18 (02/05 0428) BP: (141-155)/(72-91) 152/81 mmHg (02/05 1258) SpO2:  [93 %-100 %] 100 % (02/05 1258) Weight:  [119.296 kg (263 lb)] 119.296 kg (263 lb) (02/05 0428)  Physical Exam:  Rhythm:   sinus  Breath sounds: clear  Heart sounds:  RRR w/ systolic murmur  Incisions:  n/a  Abdomen:  Soft, non-distended, non-tender  Extremities:  Warm, well-perfused    Intake/Output from previous day: 02/04 0701 - 02/05 0700 In: 1697 [P.O.:1437; I.V.:60; IV Piggyback:200] Out: 3150 [Urine:3150] Intake/Output this shift: Total I/O In: 240 [P.O.:240] Out: 1950 [Urine:1950]  Lab Results:  Recent Labs  02/03/15 0550 02/04/15 0545  WBC 5.8 4.6  HGB 8.2* 8.1*  HCT 24.9* 25.2*  PLT 285 292   BMET:  Recent Labs  02/03/15 0550 02/04/15 0545  NA 139 141  K 3.5 3.6  CL 107 106  CO2 28 27  GLUCOSE 126* 142*  BUN 13 16  CREATININE 1.78* 1.81*  CALCIUM 7.8* 7.8*    CBG (last 3)   Recent Labs  02/03/15 2115 02/04/15 0754 02/04/15 1207  GLUCAP 167* 128* 184*   PT/INR:  No results for input(s): LABPROT, INR in the last 72 hours.  CXR:  N/A  Transthoracic Echocardiography  Patient:  Adonus, Uselman MR #:    [id number] Study Date: 02/04/2015 Gender:   M Age:    35 Height:   188 cm Weight:   119.3 kg BSA:    2.53 m^2 Pt. Status: Room:    3W16C  ADMITTING  Leroy Sea SONOGRAPHER Delcie Roch, RDCS, CCT ORDERING   Dwana Melena PERFORMING  [facility], Inpatient ATTENDING  Albertine Grates  [id number]  cc:  ------------------------------------------------------------------- LV EF: 55% -  60%  ------------------------------------------------------------------- Indications:   Dyspnea 786.09. Mitral regurgitation 424.0.  ------------------------------------------------------------------- History:  PMH:  Dyspnea. Congestive heart failure. Risk factors: Endocarditis. Edema. Current tobacco use. Hypertension. Diabetes mellitus.  ------------------------------------------------------------------- Study Conclusions  - Left ventricle: The cavity size was normal. Wall thickness was increased in a pattern of moderate LVH. Systolic function was normal. The estimated ejection fraction was in the range of 55% to 60%. Wall motion was normal; there were no regional wall motion abnormalities. Features are consistent with a pseudonormal left ventricular filling pattern, with concomitant abnormal relaxation and increased filling pressure (grade 2 diastolic dysfunction). Doppler parameters are consistent with high ventricular filling pressure. - Mitral valve: There was severe regurgitation. - Left atrium: The atrium was mildly dilated. - Pulmonary arteries: Systolic pressure was moderately increased. PA peak pressure: 46 mm Hg (S). - Pericardium, extracardiac: A trivial pericardial effusion was identified. There was a left pleural effusion.  Impressions:  - Normal LV function; moderate LVH; grade 2 diastolic dysfunction; mild LAE; severe MR (possible perforation of posterior MV leaflet noted on apical 2 chamber view); moderately elevated pulmonary pressure.  Transthoracic echocardiography. M-mode, complete 2D, spectral Doppler, and color Doppler. Birthdate: Patient birthdate: Jun 26, 1980. Age: Patient is 35 yr old. Sex: Gender:  male. BMI: 33.8 kg/m^2. Blood pressure:   153/84 Patient status: Inpatient. Study date: Study date:  02/04/2015. Study time: 08:14 AM. Location: Bedside.  -------------------------------------------------------------------  ------------------------------------------------------------------- Left ventricle: The cavity size was normal. Wall thickness was increased in a pattern of moderate LVH. Systolic function was normal. The estimated ejection fraction was in the range of 55% to 60%. Wall motion was normal; there were no regional wall motion abnormalities. Features are consistent with a pseudonormal left ventricular filling pattern, with concomitant abnormal relaxation and increased filling pressure (grade 2 diastolic dysfunction). Doppler parameters are consistent with high ventricular filling pressure.  ------------------------------------------------------------------- Aortic valve:  Trileaflet; normal thickness leaflets. Mobility was not restricted. Doppler: Transvalvular velocity was within the normal range. There was no stenosis. There was no regurgitation.  ------------------------------------------------------------------- Aorta: Aortic root: The aortic root was normal in size.  ------------------------------------------------------------------- Mitral valve:  Structurally normal valve.  Mobility was not restricted. Doppler: Transvalvular velocity was within the normal range. There was no evidence for stenosis. There was severe regurgitation.  Valve area by pressure half-time: 3.73 cm^2. Indexed valve area by pressure half-time: 1.47 cm^2/m^2.  Mean gradient (D): 5 mm Hg. Peak gradient (D): 9 mm Hg.  ------------------------------------------------------------------- Left atrium: The atrium was mildly dilated.  ------------------------------------------------------------------- Right ventricle: The cavity size was normal. Systolic function was normal.  ------------------------------------------------------------------- Pulmonic valve:  Doppler:  Transvalvular velocity was within the normal range. There was no evidence for stenosis. There was trivial regurgitation.  ------------------------------------------------------------------- Tricuspid valve:  Structurally normal valve.  Doppler: Transvalvular velocity was within the normal range. There was trivial regurgitation.  ------------------------------------------------------------------- Pulmonary artery:  Systolic pressure was moderately increased.  ------------------------------------------------------------------- Right atrium: The atrium was normal in size.  ------------------------------------------------------------------- Pericardium: A trivial pericardial effusion was identified.  ------------------------------------------------------------------- Systemic veins: Inferior vena cava: The vessel was normal in size.  ------------------------------------------------------------------- Pleura: There was a left pleural effusion.  ------------------------------------------------------------------- Measurements  Left ventricle              Value     Reference LV ID, ED, PLAX chordal          48  mm    43 - 52 LV ID, ES, PLAX chordal          30.2 mm    23 - 38 LV fx shortening, PLAX chordal      37  %    >=29 LV PW thickness, ED            13.6 mm    --------- IVS/LV PW ratio, ED            1.13      <=1.3 LV e&', lateral              12.8 cm/s   --------- LV E/e&', lateral             11.72     --------- LV e&', medial               7.1  cm/s   --------- LV E/e&', medial              21.13     --------- LV e&', average              9.95 cm/s   --------- LV E/e&', average             15.08     ---------  Ventricular septum            Value  Reference IVS thickness, ED             15.33 mm    ---------  LVOT                   Value     Reference LVOT ID, S                24  mm    --------- LVOT area                 4.52 cm^2   ---------  Aorta                   Value     Reference Aortic root ID, ED            32  mm    ---------  Left atrium                Value     Reference LA ID, A-P, ES              46  mm    --------- LA ID/bsa, A-P              1.82 cm/m^2  <=2.2 LA volume, S               84  ml    --------- LA volume/bsa, S             33.2 ml/m^2  --------- LA volume, ES, 1-p A4C          76  ml    --------- LA volume/bsa, ES, 1-p A4C        30  ml/m^2  --------- LA volume, ES, 1-p A2C          85  ml    --------- LA volume/bsa, ES, 1-p A2C        33.6 ml/m^2  ---------  Mitral valve               Value     Reference Mitral E-wave peak velocity        150  cm/s   --------- Mitral A-wave peak velocity        69.6 cm/s   --------- Mitral mean velocity, D          95.4 cm/s   --------- Mitral deceleration time         211  ms    150 - 230 Mitral pressure half-time         59  ms    --------- Mitral mean gradient, D          5   mm Hg  --------- Mitral peak gradient, D          9   mm Hg  --------- Mitral E/A ratio, peak          2.2      --------- Mitral valve area, PHT, DP        3.73 cm^2   --------- Mitral valve area/bsa, PHT, DP      1.47 cm^2/m^2 --------- Mitral annulus VTI, D           38.5 cm    --------- Mitral maximal regurg velocity,      609  cm/s    --------- PISA Mitral regurg VTI, PISA          160  cm    --------- Mitral ERO, PISA  0.16 cm^2   --------- Mitral regurg volume, PISA        26  ml    ---------  Pulmonary arteries            Value     Reference PA pressure, S, DP        (H)   46  mm Hg  <=30  Tricuspid valve              Value     Reference Tricuspid regurg peak velocity      326  cm/s   --------- Tricuspid peak RV-RA gradient       43  mm Hg  ---------  Systemic veins              Value     Reference Estimated CVP               3   mm Hg  ---------  Right ventricle              Value     Reference RV pressure, S, DP        (H)   46  mm Hg  <=30 RV s&', lateral, S             16.7 cm/s   ---------  Legend: (L) and (H) mark values outside specified reference range.  ------------------------------------------------------------------- Prepared and Electronically Authenticated by  Olga Millers 2016-02-05T10:29:46     MRI OF THE RIGHT SHOULDER WITHOUT CONTRAST  TECHNIQUE: Multiplanar, multisequence MR imaging of the shoulder was performed. No intravenous contrast was administered.  COMPARISON: MRI dated 01/11/2015 and radiographs dated 01/09/2015  FINDINGS: Glenohumeral Joint: There is a large glenohumeral joint effusion with some debris in the joint. These have increased since the prior exam.  Bones: There is new edema in the head of the humerus around the superior aspect of the bicipital groove. There is no bone destruction but given the patient's known septic joint, the possibility of early osteomyelitis must be considered.  Rotator cuff: Normal.  Muscles: There is slight edema in and around all of the muscles of the rotator cuff. Some of this appears to be  extravasated joint fluid. In the setting of infection, this could represent infectious myositis.  Biceps long head: Properly located and intact. Fluid distends the bicipital tendon sheath.  Acromioclavicular Joint: Slight arthritic changes of the AC joint, stable. New small joint effusion.  Labrum: Normal.  IMPRESSION: 1. Increased glenohumeral joint effusion with extravasated joint fluid extending around the muscles of the rotator cuff with edema in the muscles. The findings are worrisome for persistent infection in the joint as well as adjacent infectious myositis. 2. New edema in the humeral head adjacent to the bicipital groove. In the setting of septic joint, the possibility of early osteomyelitis has to be considered. 3. New small effusion in the acromioclavicular joint. Given the patient's sepsis, the possibility of infection in the [facility] joint mass be considered.   Electronically Signed  By: Geanie Cooley M.D.  On: 02/04/2015 12:13   Assessment/Plan:  Patient remains very stable from a cardiac standpoint.  No fevers and f/u blood cultures negative to date.  CHF improving w/ diuretic therapy.  I have personally reviewed the patient's f/u echocardiogram.  He has severe MR but no new or complicating features.  LV size and systolic function normal.  Trivial residual pericardial effusion.  Given the findings on today's MRI of the shoulder, it seems clear that the patient has failed  non-operative management of septic arthritis involving the right shoulder.  This probably should be addressed promptly and must be dealt with before I would consider proceeding with mitral valve repair/replacement.  I spent in excess of 15 minutes during the conduct of this hospital encounter and >50% of this time involved direct face-to-face encounter with the patient for counseling and/or coordination of their care.   Jacilyn Sanpedro H 02/04/2015 2:14 PM
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
## 2015-02-04 NOTE — Progress Notes (Signed)
Subjective: Improvement in dyspnea.  Will try to lie flat tonight.  Objective: Vital signs in last 24 hours: Temp:  [98.5 F (36.9 C)-98.9 F (37.2 C)] 98.5 F (36.9 C) (02/05 0800) Pulse Rate:  [77-87] 80 (02/05 0800) Resp:  [18] 18 (02/05 0428) BP: (141-155)/(72-91) 153/84 mmHg (02/05 0800) SpO2:  [93 %-100 %] 100 % (02/05 0800) Weight:  [263 lb (119.296 kg)] 263 lb (119.296 kg) (02/05 0428) Last BM Date: 01/28/15  Intake/Output from previous day: 02/04 0701 - 02/05 0700 In: 1697 [P.O.:1437; I.V.:60; IV Piggyback:200] Out: 3150 [Urine:3150] Intake/Output this shift: Total I/O In: 240 [P.O.:240] Out: 1950 [Urine:1950]  Medications Current Facility-Administered Medications  Medication Dose Route Frequency Provider Last Rate Last Dose  . acetaminophen (TYLENOL) tablet 650 mg  650 mg Oral Q6H PRN Albertine Grates, MD   650 mg at 02/03/15 1218  . alum & mag hydroxide-simeth (MAALOX/MYLANTA) 200-200-20 MG/5ML suspension 30 mL  30 mL Oral Q6H PRN Leroy Sea, MD      . ampicillin (OMNIPEN) 2 g in sodium chloride 0.9 % 50 mL IVPB  2 g Intravenous Q4H Para March Batchelder, RPH   2 g at 02/04/15 1228  . feeding supplement (ENSURE COMPLETE) (ENSURE COMPLETE) liquid 237 mL  237 mL Oral TID WC Kelle Darting Hager, PA-C   237 mL at 02/03/15 1200  . furosemide (LASIX) injection 80 mg  80 mg Intravenous BID Dwana Melena, PA-C   80 mg at 02/04/15 0754  . guaiFENesin-dextromethorphan (ROBITUSSIN DM) 100-10 MG/5ML syrup 5 mL  5 mL Oral Q4H PRN Leroy Sea, MD   5 mL at 02/03/15 0520  . heparin injection 5,000 Units  5,000 Units Subcutaneous 3 times per day Leroy Sea, MD   5,000 Units at 02/04/15 0603  . hydrALAZINE (APRESOLINE) injection 10 mg  10 mg Intravenous Q6H PRN Leroy Sea, MD   10 mg at 02/03/15 0820  . hydrALAZINE (APRESOLINE) tablet 50 mg  50 mg Oral 3 times per day Dwana Melena, PA-C   50 mg at 02/04/15 1610  . HYDROcodone-acetaminophen (NORCO/VICODIN) 5-325 MG per  tablet 1-2 tablet  1-2 tablet Oral Q4H PRN Leroy Sea, MD   2 tablet at 02/04/15 0803  . Influenza vac split quadrivalent PF (FLUARIX) injection 0.5 mL  0.5 mL Intramuscular Tomorrow-1000 Leroy Sea, MD      . insulin aspart (novoLOG) injection 0-9 Units  0-9 Units Subcutaneous TID WC Leroy Sea, MD   2 Units at 02/04/15 1228  . magnesium hydroxide (MILK OF MAGNESIA) suspension 30 mL  30 mL Oral Daily PRN Leroy Sea, MD      . metoprolol (LOPRESSOR) tablet 100 mg  100 mg Oral BID Leroy Sea, MD   100 mg at 02/04/15 1227  . nitroGLYCERIN (NITROGLYN) 2 % ointment 0.5 inch  0.5 inch Topical 4 times per day Leroy Sea, MD   0.5 inch at 02/04/15 0602  . ondansetron (ZOFRAN) tablet 4 mg  4 mg Oral Q6H PRN Leroy Sea, MD       Or  . ondansetron (ZOFRAN) injection 4 mg  4 mg Intravenous Q6H PRN Leroy Sea, MD      . sodium chloride 0.9 % injection 3 mL  3 mL Intravenous Q12H Leroy Sea, MD   3 mL at 02/02/15 2200    PE: General appearance: alert, cooperative and no distress Lungs: Decreased BS,  Rales Heart: regular rate and rhythm  and 1/6 sys MM Extremities: 3+ LEE Pulses: 2+ and symmetric Skin: Warm and dry Neurologic: Grossly normal  Lab Results:   Recent Labs  02/02/15 1035 02/03/15 0550 02/04/15 0545  WBC 6.3 5.8 4.6  HGB 9.0* 8.2* 8.1*  HCT 27.2* 24.9* 25.2*  PLT 316 285 292   BMET  Recent Labs  02/02/15 1035 02/03/15 0550 02/04/15 0545  NA 142 139 141  K 3.6 3.5 3.6  CL 110 107 106  CO2 27 28 27   GLUCOSE 65* 126* 142*  BUN 12 13 16   CREATININE 1.73* 1.78* 1.81*  CALCIUM 8.2* 7.8* 7.8*    Assessment/Plan  Principal Problem:   Acute diastolic CHF (congestive heart failure), NYHA class 3 Continue diuresis.  Breathing better.  Still quite edematous.  repeat Echo shows severe MR. O>>I.   Essential hypertension, benign  Better controlled.  Continue current meds.     Diabetes type 2, uncontrolled  Per IM   Acute  renal failure  SCr stable at 1.78.     Mitral regurgitation  Seen by Dr. Dillon Bjorkwen(see note)   Endocarditis  IV ampicillin.  Blood cultures pending.     PVD (peripheral vascular disease): LLE ABI 0.64   Hypoalbuminemia  Needs high protein diet.     Anasarca   Right shoulder septic arthritis.  Per ortho. Pain improved.  MRI results pending.     LOS: 2 days     Corky CraftsVARANASI,Kameron Glazebrook S., MD [facility] 02/04/2015 12:57 PM

## 2015-02-04 NOTE — Progress Notes (Signed)
ANTIBIOTIC CONSULT NOTE - FOLLOW UP  Pharmacy Consult for ampicillin Indication: endocarditis  No Known Allergies  Patient Measurements: Height: _0  (188 cm) Weight: 263 lb (119.296 kg) IBW/kg (Calculated) : 82.2  Vital Signs: Temp: 98.5 F (36.9 C) (02/05 0800) Temp Source: Oral (02/05 0800) BP: 153/84 mmHg (02/05 0800) Pulse Rate: 80 (02/05 0800) Intake/Output from previous day: 02/04 0701 - 02/05 0700 In: 5573 [P.O.:1437; I.V.:60; IV Piggyback:200] Out: 3150 [Urine:3150] Intake/Output from this shift:    Labs:  Recent Labs  02/02/15 1035 02/03/15 0550 02/04/15 0545  WBC 6.3 5.8 4.6  HGB 9.0* 8.2* 8.1*  PLT 316 285 292  CREATININE 1.73* 1.78* 1.81*   Estimated Creatinine Clearance: 78.9 mL/min (by C-G formula based on Cr of 1.81). No results for input(s): VANCOTROUGH, VANCOPEAK, VANCORANDOM, GENTTROUGH, GENTPEAK, GENTRANDOM, TOBRATROUGH, TOBRAPEAK, TOBRARND, AMIKACINPEAK, AMIKACINTROU, AMIKACIN in the last 72 hours.   Microbiology: Recent Results (from the past 720 hour(s))  Urine culture     Status: None   Collection Time: 01/09/15  1:31 AM  Result Value Ref Range Status   Specimen Description URINE, CLEAN CATCH  Final   Special Requests NONE  Final   Colony Count   Final    3,000 COLONIES/ML Performed at [facility]    Culture   Final    INSIGNIFICANT GROWTH Performed at [facility]    Report Status 01/10/2015 FINAL  Final  Stool culture     Status: None   Collection Time: 01/09/15  4:09 AM  Result Value Ref Range Status   Specimen Description VAGINAL/RECTAL  Final   Special Requests Normal  Final   Culture   Final    NO SALMONELLA, SHIGELLA, CAMPYLOBACTER, YERSINIA, OR E.COLI 0157:H7 ISOLATED Performed at [facility]    Report Status 01/12/2015 FINAL  Final  Clostridium Difficile by PCR     Status: None   Collection Time: 01/09/15  4:09 AM  Result Value Ref Range Status   C difficile by pcr NEGATIVE NEGATIVE Final   Culture, blood (routine x 2)     Status: None   Collection Time: 01/10/15  4:35 AM  Result Value Ref Range Status   Specimen Description BLOOD RIGHT HAND  Final   Special Requests BOTTLES DRAWN AEROBIC AND ANAEROBIC 10CC EA  Final   Culture   Final    STAPHYLOCOCCUS AUREUS Note: SUSCEPTIBILITIES PERFORMED ON PREVIOUS CULTURE WITHIN THE LAST 5 DAYS. Note: Culture results may be compromised due to an excessive volume of blood received in culture bottles. Gram Stain Report Called to,Read Back By and Verified With:  MONICA YIM ON 01/10/2015 AT 11:36P BY WILEJ Performed at [facility]    Report Status 01/13/2015 FINAL  Final  Culture, blood (routine x 2)     Status: None   Collection Time: 01/10/15  4:46 AM  Result Value Ref Range Status   Specimen Description BLOOD LEFT ARM  Final   Special Requests BOTTLES DRAWN AEROBIC AND ANAEROBIC 10CC EA  Final   Culture   Final    STAPHYLOCOCCUS AUREUS Note: SUSCEPTIBILITIES PERFORMED ON PREVIOUS CULTURE WITHIN THE LAST 5 DAYS. Note: Culture results may be compromised due to an excessive volume of blood received in culture bottles. Gram Stain Report Called to,Read Back By and Verified With: MONICA YIM ON 01/10/2015 AT 11:36P BY Dennard Nip Performed at [facility]    Report Status 01/13/2015 FINAL  Final  Respiratory virus panel (routine influenza)     Status: None  Collection Time: 01/10/15 10:34 AM  Result Value Ref Range Status   Source - RVPAN NASAL SWAB  Corrected    Comment: CORRECTED ON 01/12 AT 2036: PREVIOUSLY REPORTED AS NASAL SWAB   Respiratory Syncytial Virus A NOT DETECTED  Final   Respiratory Syncytial Virus B NOT DETECTED  Final   Influenza A NOT DETECTED  Final   Influenza B NOT DETECTED  Final   Parainfluenza 1 NOT DETECTED  Final   Parainfluenza 2 NOT DETECTED  Final   Parainfluenza 3 NOT DETECTED  Final   Metapneumovirus NOT DETECTED  Final   Rhinovirus NOT DETECTED  Final   Adenovirus NOT DETECTED  Final    Influenza A H1 NOT DETECTED  Final   Influenza A H3 NOT DETECTED  Final    Comment: (NOTE)       Normal Reference Range for each Analyte: NOT DETECTED Testing performed using the Luminex xTAG Respiratory Viral Panel test kit. The analytical performance characteristics of this assay have been determined by [facility].  The modifications have not been cleared or approved by the FDA. This assay has been validated pursuant to the CLIA regulations and is used for clinical purposes. Performed at [facility], blood (routine x 2)     Status: None   Collection Time: 01/10/15 11:19 AM  Result Value Ref Range Status   Specimen Description BLOOD LEFT FOREARM  Final   Special Requests BOTTLES DRAWN AEROBIC ONLY 5CC  Final   Culture   Final    STAPHYLOCOCCUS AUREUS Note: RIFAMPIN AND GENTAMICIN SHOULD NOT BE USED AS SINGLE DRUGS FOR TREATMENT OF STAPH INFECTIONS. Note: Gram Stain Report Called to,Read Back By and Verified With: NURSE MONICA YIM 01/11/15 6:05AM THOMI Performed at [facility]    Report Status 01/13/2015 FINAL  Final   Organism ID, Bacteria STAPHYLOCOCCUS AUREUS  Final      Susceptibility   Staphylococcus aureus - MIC*    CLINDAMYCIN <=0.25 SENSITIVE Sensitive     ERYTHROMYCIN <=0.25 SENSITIVE Sensitive     GENTAMICIN <=0.5 SENSITIVE Sensitive     LEVOFLOXACIN 4 INTERMEDIATE Intermediate     OXACILLIN 0.5 SENSITIVE Sensitive     PENICILLIN 0.12 SENSITIVE Sensitive     RIFAMPIN 2 INTERMEDIATE Intermediate     TRIMETH/SULFA <=10 SENSITIVE Sensitive     VANCOMYCIN 1 SENSITIVE Sensitive     TETRACYCLINE <=1 SENSITIVE Sensitive     MOXIFLOXACIN 2 RESISTANT Resistant     * STAPHYLOCOCCUS AUREUS  Culture, blood (routine x 2)     Status: None   Collection Time: 01/10/15 11:27 AM  Result Value Ref Range Status   Specimen Description BLOOD LEFT ANTECUBITAL  Final   Special Requests BOTTLES DRAWN AEROBIC ONLY [place]  Final   Culture   Final     STAPHYLOCOCCUS AUREUS Note: SUSCEPTIBILITIES PERFORMED ON PREVIOUS CULTURE WITHIN THE LAST 5 DAYS. Note: Gram Stain Report Called to,Read Back By and Verified With: NURSE MONICA YIM 01/11/15 6:05AM THOMI Performed at [facility]    Report Status 01/13/2015 FINAL  Final  Culture, blood (routine x 2)     Status: None   Collection Time: 01/13/15  7:20 PM  Result Value Ref Range Status   Specimen Description BLOOD RIGHT ARM  Final   Special Requests BOTTLES DRAWN AEROBIC AND ANAEROBIC 5CC  Final   Culture   Final    NO GROWTH 5 DAYS Performed at [facility]    Report Status 01/20/2015  FINAL  Final  Culture, blood (routine x 2)     Status: None   Collection Time: 01/13/15  7:20 PM  Result Value Ref Range Status   Specimen Description BLOOD RIGHT ARM  Final   Special Requests BOTTLES DRAWN AEROBIC AND ANAEROBIC 3CC  Final   Culture   Final    NO GROWTH 5 DAYS Performed at [facility]    Report Status 01/20/2015 FINAL  Final  Body fluid culture     Status: None   Collection Time: 01/14/15  9:02 AM  Result Value Ref Range Status   Specimen Description FLUID RIGHT SHOULDER SYNOVIAL  Final   Special Requests NONE  Final   Gram Stain   Final    FEW WBC PRESENT,BOTH PMN AND MONONUCLEAR NO ORGANISMS SEEN Performed at [facility]    Culture   Final    NO GROWTH 3 DAYS Performed at [facility]    Report Status 01/17/2015 FINAL  Final  Culture, blood (routine x 2)     Status: None   Collection Time: 01/18/15  6:10 PM  Result Value Ref Range Status   Specimen Description BLOOD LEFT ARM  Final   Special Requests BOTTLES DRAWN AEROBIC AND ANAEROBIC 10CC  Final   Culture   Final    NO GROWTH 5 DAYS Performed at [facility]    Report Status 01/25/2015 FINAL  Final  Culture, blood (routine x 2)     Status: None   Collection Time: 01/18/15  6:18 PM  Result Value Ref Range Status   Specimen Description BLOOD RIGHT ARM  Final    Special Requests BOTTLES DRAWN AEROBIC AND ANAEROBIC 10CC  Final   Culture   Final    NO GROWTH 5 DAYS Performed at [facility]    Report Status 01/25/2015 FINAL  Final  Surgical pcr screen     Status: None   Collection Time: 01/25/15  4:47 AM  Result Value Ref Range Status   MRSA, PCR NEGATIVE NEGATIVE Final   Staphylococcus aureus NEGATIVE NEGATIVE Final    Comment:        The Xpert SA Assay (FDA approved for NASAL specimens in patients over 19 years of age), is one component of a comprehensive surveillance program.  Test performance has been validated by [facility] for patients greater than or equal to 59 year old. It is not intended to diagnose infection nor to guide or monitor treatment.   Culture, blood (routine x 2)     Status: None (Preliminary result)   Collection Time: 02/02/15  3:53 PM  Result Value Ref Range Status   Specimen Description BLOOD PICC LINE  Final   Special Requests BOTTLES DRAWN AEROBIC AND ANAEROBIC 5ML  Final   Culture   Final           BLOOD CULTURE RECEIVED NO GROWTH TO DATE CULTURE WILL BE HELD FOR 5 DAYS BEFORE ISSUING A FINAL NEGATIVE REPORT Performed at [facility]    Report Status PENDING  Incomplete  Culture, blood (routine x 2)     Status: None (Preliminary result)   Collection Time: 02/02/15  4:32 PM  Result Value Ref Range Status   Specimen Description BLOOD PICC LINE  Final   Special Requests BOTTLES DRAWN AEROBIC AND ANAEROBIC 10CC  Final   Culture   Final           BLOOD CULTURE RECEIVED NO GROWTH TO DATE CULTURE WILL BE HELD FOR  5 DAYS BEFORE ISSUING A FINAL NEGATIVE REPORT Performed at [facility]    Report Status PENDING  Incomplete  MRSA PCR Screening     Status: None   Collection Time: 02/02/15  5:14 PM  Result Value Ref Range Status   MRSA by PCR NEGATIVE NEGATIVE Final    Comment:        The GeneXpert MRSA Assay (FDA approved for NASAL specimens only), is one component of a comprehensive  MRSA colonization surveillance program. It is not intended to diagnose MRSA infection nor to guide or monitor treatment for MRSA infections.     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 WBC= 4.6, afebrile, SCr= 1.81 and CrCl ~ 80. He is noted repeat TEE and for likely mitral valve replacement/repair during this admission per CVTS.   1/14 ampicillin  2/3 blood- ngtd  Plan:  -No Ampicillin dose changes needed -Will follow renal function, cultures and clinical progress  Hildred Laser, Pharm D 02/04/2015 9:01 AM

## 2015-02-04 NOTE — Plan of Care (Signed)
Problem: Food- and Nutrition-Related Knowledge Deficit (NB-1.1) Goal: Nutrition education Formal process to instruct or train a patient/client in a skill or to impart knowledge to help patients/clients voluntarily manage or modify food choices and eating behavior to maintain or improve health. Outcome: Completed/Met Date Met:  02/04/15 Nutrition Education Note  RD consulted for nutrition education regarding diabetes and CHF.  RD provided "Low Sodium Nutrition Therapy" handout from the Academy of Nutrition and Dietetics. Reviewed patient's dietary recall. Provided examples on ways to decrease sodium intake in diet. Discouraged intake of processed foods and use of salt shaker. Encouraged fresh fruits and vegetables as well as whole grain sources of carbohydrates to maximize fiber intake.   RD discussed why it is important for patient to adhere to diet recommendations, and emphasized the role of fluids, foods to avoid, and importance of weighing self daily. Teach back method used.  Also provided "plate method" handout. Reviewed foods that contain carbohydrates and their effect on blood sugar, and appropriate portion sizes. Discussed the importance of avoiding sugar sweetened beverages and processed foods. Teach back method used.  Expect poor to fair compliance. Patient did not seem very motivated to make any dietary changes.   Body mass index is 33.75 kg/(m^2). Pt meets criteria for class 1 obesity based on current BMI.  Current diet order is CHO modified, patient is consuming approximately 100% of meals at this time. Labs and medications reviewed. No further nutrition interventions warranted at this time. RD contact information provided. If additional nutrition issues arise, please re-consult RD.   Molli Barrows, RD, LDN, [place] Pager [phone] After Hours Pager [phone]

## 2015-02-04 NOTE — Consult Note (Signed)
Reason for Consult: Septic right shoulder Referring Physician: Internal medicine  Dwayne [name] is an 35 y.o. male.  HPI: Patient is a 35 year old gentleman type 2 diabetes uncontrolled who has had episodes of septic shock. Patient was seen over a month ago with effusion and pain of the right shoulder, WBC greater than 50,000. At this time patient has increased pain , aspiration was requested by interventional radiology and patient had purulent drainage.  Past Medical History  Diagnosis Date  . Hypertension   . Acute renal failure   . Bacteremia     MSSA   . Diabetes type 2, uncontrolled 01/09/2015  . 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 Essential hypertension, benign 01/18/2014  . Infective myositis of left thigh   . Noncompliance 01/18/2014  . Septic shock   . Staphylococcus aureus bacteremia with sepsis   . Tobacco abuse 01/15/2014  . Mitral regurgitation 01/13/2015    Severe by TEE  . MSSA (methicillin susceptible Staphylococcus aureus)   . Type II diabetes mellitus     not controlled  . Diabetic foot infection 01/15/2014    Past Surgical History  Procedure Laterality Date  . I&d extremity Bilateral 01/16/2014    Procedure:  DEBRIDEMENT of bilateral foot ulcers;  Surgeon: [name], MD;  Location: [facility];  Service: Orthopedics;  Laterality: Bilateral;  . Tee without cardioversion N/A 01/13/2015    Procedure: TRANSESOPHAGEAL ECHOCARDIOGRAM (TEE);  Surgeon: [name], MD;  Location: [facility] ENDOSCOPY;  Service: Cardiovascular;  Laterality: N/A;  . Left and right heart catheterization with coronary angiogram N/A 01/24/2015    Procedure: LEFT AND RIGHT HEART CATHETERIZATION WITH CORONARY ANGIOGRAM;  Surgeon: [name], MD;  Location: [facility] CATH LAB;  Service: Cardiovascular;  Laterality: N/A;  . Multiple extractions with alveoloplasty N/A 01/25/2015    Procedure: Extraction of tooth #'s 1,2,3,4,5,6,8,9,11,12,13,14,15,16,17,18,19 28,31,32 with alveoloplasty and gross  debridement of remaining teeth.;  Surgeon: [name], DDS;  Location: [place];  Service: Oral Surgery;  Laterality: N/A;  . Cardiac catheterization  12/2014    History reviewed. No pertinent family history.  Social History:  reports that he quit smoking about 3 weeks ago. His smoking use included Cigars. He has never used smokeless tobacco. He reports that he drinks alcohol. He reports that he does not use illicit drugs.  Allergies: No Known Allergies  Medications: I have reviewed the patient's current medications.  Results for orders placed or performed during the hospital encounter of 02/02/15 (from the past 48 hour(s))  Glucose, capillary     Status: Abnormal   Collection Time: 02/02/15  8:59 PM  Result Value Ref Range   Glucose-Capillary 145 (H) 70 - 99 mg/dL   Comment 1 Orig Pt Id entered as [id number]   Basic metabolic panel     Status: Abnormal   Collection Time: 02/03/15  5:50 AM  Result Value Ref Range   Sodium 139 135 - 145 mmol/L   Potassium 3.5 3.5 - 5.1 mmol/L   Chloride 107 96 - 112 mmol/L   CO2 28 19 - 32 mmol/L   Glucose, Bld 126 (H) 70 - 99 mg/dL   BUN 13 6 - 23 mg/dL   Creatinine, Ser 1.78 (H) 0.50 - 1.35 mg/dL   Calcium 7.8 (L) 8.4 - 10.5 mg/dL   GFR calc non Af Amer 48 (L) >90 mL/min   GFR calc Af Amer 56 (L) >90 mL/min    Comment: (NOTE) The eGFR has been calculated using the CKD EPI equation. This  calculation has not been validated in all clinical situations. eGFR's persistently <90 mL/min signify possible Chronic Kidney Disease.    Anion gap 4 (L) 5 - 15  CBC     Status: Abnormal   Collection Time: 02/03/15  5:50 AM  Result Value Ref Range   WBC 5.8 4.0 - 10.5 K/uL   RBC 2.93 (L) 4.22 - 5.81 MIL/uL   Hemoglobin 8.2 (L) 13.0 - 17.0 g/dL   HCT 24.9 (L) 39.0 - 52.0 %   MCV 85.0 78.0 - 100.0 fL   MCH 28.0 26.0 - 34.0 pg   MCHC 32.9 30.0 - 36.0 g/dL   RDW 13.6 11.5 - 15.5 %   Platelets 285 150 - 400 K/uL  Glucose, capillary     Status: Abnormal    Collection Time: 02/03/15  7:37 AM  Result Value Ref Range   Glucose-Capillary 140 (H) 70 - 99 mg/dL  Glucose, capillary     Status: Abnormal   Collection Time: 02/03/15 11:37 AM  Result Value Ref Range   Glucose-Capillary 138 (H) 70 - 99 mg/dL  Glucose, capillary     Status: Abnormal   Collection Time: 02/03/15  4:30 PM  Result Value Ref Range   Glucose-Capillary 132 (H) 70 - 99 mg/dL  Glucose, capillary     Status: Abnormal   Collection Time: 02/03/15  9:15 PM  Result Value Ref Range   Glucose-Capillary 167 (H) 70 - 99 mg/dL  CBC     Status: Abnormal   Collection Time: 02/04/15  5:45 AM  Result Value Ref Range   WBC 4.6 4.0 - 10.5 K/uL   RBC 2.88 (L) 4.22 - 5.81 MIL/uL   Hemoglobin 8.1 (L) 13.0 - 17.0 g/dL   HCT 25.2 (L) 39.0 - 52.0 %   MCV 87.5 78.0 - 100.0 fL   MCH 28.1 26.0 - 34.0 pg   MCHC 32.1 30.0 - 36.0 g/dL   RDW 13.8 11.5 - 15.5 %   Platelets 292 150 - 400 K/uL  Basic metabolic panel     Status: Abnormal   Collection Time: 02/04/15  5:45 AM  Result Value Ref Range   Sodium 141 135 - 145 mmol/L   Potassium 3.6 3.5 - 5.1 mmol/L   Chloride 106 96 - 112 mmol/L   CO2 27 19 - 32 mmol/L   Glucose, Bld 142 (H) 70 - 99 mg/dL   BUN 16 6 - 23 mg/dL   Creatinine, Ser 1.81 (H) 0.50 - 1.35 mg/dL   Calcium 7.8 (L) 8.4 - 10.5 mg/dL   GFR calc non Af Amer 47 (L) >90 mL/min   GFR calc Af Amer 55 (L) >90 mL/min    Comment: (NOTE) The eGFR has been calculated using the CKD EPI equation. This calculation has not been validated in all clinical situations. eGFR's persistently <90 mL/min signify possible Chronic Kidney Disease.    Anion gap 8 5 - 15  Magnesium     Status: None   Collection Time: 02/04/15  5:45 AM  Result Value Ref Range   Magnesium 1.9 1.5 - 2.5 mg/dL  TSH     Status: None   Collection Time: 02/04/15  5:45 AM  Result Value Ref Range   TSH 2.046 0.350 - 4.500 uIU/mL  Glucose, capillary     Status: Abnormal   Collection Time: 02/04/15  7:54 AM  Result  Value Ref Range   Glucose-Capillary 128 (H) 70 - 99 mg/dL  Glucose, capillary     Status: Abnormal  Collection Time: 02/04/15 12:07 PM  Result Value Ref Range   Glucose-Capillary 184 (H) 70 - 99 mg/dL  Glucose, capillary     Status: Abnormal   Collection Time: 02/04/15  4:34 PM  Result Value Ref Range   Glucose-Capillary 157 (H) 70 - 99 mg/dL  Glucose, capillary     Status: Abnormal   Collection Time: 02/04/15  6:17 PM  Result Value Ref Range   Glucose-Capillary 121 (H) 70 - 99 mg/dL    Mr Shoulder Right Wo Contrast  02/04/2015   CLINICAL DATA:  Septic arthritis of the right shoulder.  EXAM: MRI OF THE RIGHT SHOULDER WITHOUT CONTRAST  TECHNIQUE: Multiplanar, multisequence MR imaging of the shoulder was performed. No intravenous contrast was administered.  COMPARISON:  MRI dated 01/11/2015 and radiographs dated 01/09/2015  FINDINGS: Glenohumeral Joint: There is a large glenohumeral joint effusion with some debris in the joint. These have increased since the prior exam.  Bones: There is new edema in the head of the humerus around the superior aspect of the bicipital groove. There is no bone destruction but given the patient's known septic joint, the possibility of early osteomyelitis must be considered.  Rotator cuff:  Normal.  Muscles: There is slight edema in and around all of the muscles of the rotator cuff. Some of this appears to be extravasated joint fluid. In the setting of infection, this could represent infectious myositis.  Biceps long head: Properly located and intact. Fluid distends the bicipital tendon sheath.  Acromioclavicular Joint: Slight arthritic changes of the AC joint, stable. New small joint effusion.  Labrum:  Normal.  IMPRESSION: 1. Increased glenohumeral joint effusion with extravasated joint fluid extending around the muscles of the rotator cuff with edema in the muscles. The findings are worrisome for persistent infection in the joint as well as adjacent infectious myositis.  2. New edema in the humeral head adjacent to the bicipital groove. In the setting of septic joint, the possibility of early osteomyelitis has to be considered. 3. New small effusion in the acromioclavicular joint. Given the patient's sepsis, the possibility of infection in the [facility] joint mass be considered.   Electronically Signed   By: Rozetta Nunnery M.D.   On: 02/04/2015 12:13    Review of Systems  All other systems reviewed and are negative.  Blood pressure 149/81, pulse 77, temperature 99.3 F (37.4 C), temperature source Oral, resp. rate 18, height 6' 2"  (1.88 m), weight 119.296 kg (263 lb), SpO2 100 %. Physical Exam On examination patient has pain to palpation around the shoulder. The before meals joint is minimally tender to palpation. Passive range of motion is painful. Review of the MRI scan is suggestive of early osteomyelitis with effusion of the joint. Assessment/Plan: Assessment: Septic right shoulder with early osteomyelitis.  Plan: I will plan for arthroscopic debridement of the right shoulder tomorrow morning. Risks and benefits of surgery were discussed with the patient including persistent infection neurovascular injury risks of surgery need for additional surgery. Patient states he understands and wishes to proceed at this time.  DUDA,MARCUS V 02/04/2015, 6:58 PM

## 2015-02-04 NOTE — Progress Notes (Signed)
PROGRESS NOTE  Dwayne [name] XBM:[id number] DOB: 06/22/1980 DOA: 02/02/2015 PCP: No PCP Per Patient  HPI/Recap of past 24 hours: States feeling better, denies shoulder pain at this moment. Sitting on the edge of bed playing tablet. Does not want to engage care actively, states his wife is a CNA , normally take cares of his medical needs and information.  Assessment/Plan: Principal Problem:   Acute diastolic CHF (congestive heart failure), NYHA class 3 Active Problems:   Essential hypertension, benign   Diabetes type 2, uncontrolled   Acute renal failure   Mitral regurgitation   Endocarditis   PVD (peripheral vascular disease)   Hypoalbuminemia   Anasarca   Arthritis, septic, shoulder   Acute pericarditis 1. Acute on chronic diastolic dysfunction EF 60%. 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 stepdown unit, IV Lasix 80 mg twice a day one dose now, nitro paste, continue beta blocker, R [employer] Cardura 6 surgery consulted by cardiology. Blood cultures drawn to make sure no ongoing bacteremia. Continue IV ampicillin dosed by pharmacy for now. Infectious disease consulted. Has left arm PICC line. Supportive care with oxygen and nebulizer treatments.  2. Right shoulder septic arthritis. Seen by orthopedics last admission. IV antibiotics. No surgical intervention planned by orthopedics during last admission.   Mri shoulder on 2/5 concerning for septic arthritis/osteo? [facility] Dr. Lajoyce Corners called and recommended imaging guided joint aspiration, he will see patient later today or tomorrow. Discussed with [place] radiology MD, agree with proceed right shoulder joint aspiration today. ([id number])  3. Dental caries. Multiple teeth removed one week ago prior to this admission. Still requiring prn pain meds.  4. DM type II. Last A1c was above 8. A new home dose 70-30, add sliding scale.    5. Essential hypertension. Home medications to continue. Add Nitropaste  and as needed IV hydrazine for better control.   6. Chronic kidney disease stage IV. Baseline creatinine around 2. Currently at or better than baseline.  Code Status: full  Family Communication: patient  Disposition Plan: remain in patient   Consultants:  Cardiology/cardiothoracic surgery/infectious disease/radiology  Procedures:  Right shoulder joint aspiration  Antibiotics:  ampicillin   Objective: BP 149/81 mmHg  Pulse 77  Temp(Src) 99.3 F (37.4 C) (Oral)  Resp 18  Ht  (1.88 m)  Wt 119.296 kg (263 lb)  BMI 33.75 kg/m2  SpO2 100%  Intake/Output Summary (Last 24 hours) at 02/04/15 1623 Last data filed at 02/04/15 1400  Gross per 24 hour  Intake   1120 ml  Output   4325 ml  Net  -3205 ml   Filed Weights   02/03/15 0500 02/03/15 0841 02/04/15 0428  Weight: 119.659 kg (263 lb 12.8 oz) 119.477 kg (263 lb 6.4 oz) 119.296 kg (263 lb)    Exam:   General:  Aaox3, NAD  Cardiovascular: RRR, ? murmur  Respiratory: decreased at bases, no wheezing/no rhonchi/ rales?  Abdomen: soft/NT/ND, positive bowel sounds  Musculoskeletal: pitting edema bilateral  Data Reviewed: Basic Metabolic Panel:  Recent Labs Lab 02/02/15 1035 02/03/15 0550 02/04/15 0545  NA 142 139 141  K 3.6 3.5 3.6  CL 110 107 106  CO2 GLUCOSE 65* 126* 142*  BUN CREATININE 1.73* 1.78* 1.81*  CALCIUM 8.2* 7.8* 7.8*  MG  --   --  1.9   Liver Function Tests:  Recent Labs Lab 02/02/15 1035  AST 18  ALT 20  ALKPHOS 112  BILITOT 0.3  PROT 6.5  ALBUMIN 1.6*   No results for input(s): LIPASE, AMYLASE in the last 168 hours. No results for input(s): AMMONIA in the last 168 hours. CBC:  Recent Labs Lab 02/02/15 1035 02/03/15 0550 02/04/15 0545  WBC 6.3 5.8 4.6  NEUTROABS 4.2  --   --   HGB 9.0* 8.2* 8.1*  HCT 27.2* 24.9* 25.2*  MCV 86.1 85.0 87.5  PLT 316 285 292   Cardiac Enzymes:   No results for input(s): CKTOTAL, CKMB, CKMBINDEX, TROPONINI  in the last 168 hours. BNP (last 3 results)  Recent Labs  01/17/15 0535 01/22/15 0347 02/02/15 1035  BNP 315.1* 218.9* 629.0*    ProBNP (last 3 results) No results for input(s): PROBNP in the last 8760 hours.  CBG:  Recent Labs Lab 02/03/15 1137 02/03/15 1630 02/03/15 2115 02/04/15 0754 02/04/15 1207  GLUCAP 138* 132* 167* 128* 184*    Recent Results (from the past 240 hour(s))  Culture, blood (routine x 2)     Status: None (Preliminary result)   Collection Time: 02/02/15  3:53 PM  Result Value Ref Range Status   Specimen Description BLOOD PICC LINE  Final   Special Requests BOTTLES DRAWN AEROBIC AND ANAEROBIC 5ML  Final   Culture   Final           BLOOD CULTURE RECEIVED NO GROWTH TO DATE CULTURE WILL BE HELD FOR 5 DAYS BEFORE ISSUING A FINAL NEGATIVE REPORT [facility]formed [facility]    Report Status PENDING  Incomplete  Culture, blood (routine x 2)     Status: None (Preliminary result)   Collection Time: 02/02/15  4:32 PM  Result Value Ref Range Status   Specimen Description BLOOD PICC LINE  Final   Special Requests BOTTLES DRAWN AEROBIC AND ANAEROBIC 10CC  Final   Culture   Final           BLOOD CULTURE RECEIVED NO GROWTH TO DATE CULTURE WILL BE HELD FOR 5 DAYS BEFORE ISSUING A FINAL NEGATIVE REPORT Performed at [facility]    Report Status PENDING  Incomplete  MRSA PCR Screening     Status: None   Collection Time: 02/02/15  5:14 PM  Result Value Ref Range Status   MRSA by PCR NEGATIVE NEGATIVE Final    Comment:        The GeneXpert MRSA Assay (FDA approved for NASAL specimens only), is one component of a comprehensive MRSA colonization surveillance program. It is not intended to diagnose MRSA infection nor to guide or monitor treatment for MRSA infections.      Studies: No results found.  Scheduled Meds: . ampicillin (OMNIPEN) IV  2 g Intravenous Q4H  . feeding supplement (ENSURE COMPLETE)  237 mL Oral TID WC  . furosemide  80  mg Intravenous BID  . hydrALAZINE  50 mg Oral 3 times per day  . Influenza vac split quadrivalent PF  0.5 mL Intramuscular Tomorrow-1000  . insulin aspart  0-9 Units Subcutaneous TID WC  . metoprolol tartrate  100 mg Oral BID  . nitroGLYCERIN  0.5 inch Topical 4 times per day  . sodium chloride  3 mL Intravenous Q12H    Continuous Infusions: none     Tajha Sammarco  [facility] Pager [phone]. If 7PM-7AM, please contact night-coverage at [email or web address], password [email or web address] 02/04/2015, 4:23 PM  LOS: 2 days

## 2015-02-04 NOTE — Progress Notes (Signed)
  Echocardiogram 2D Echocardiogram has been performed.  Dwayne [name], Dwayne [name] 02/04/2015, 8:54 AM

## 2015-02-05 ENCOUNTER — Encounter (HOSPITAL_COMMUNITY): Payer: Self-pay | Admitting: Certified Registered Nurse Anesthetist

## 2015-02-05 ENCOUNTER — Inpatient Hospital Stay (HOSPITAL_COMMUNITY): Payer: Medicaid Other | Admitting: Certified Registered Nurse Anesthetist

## 2015-02-05 ENCOUNTER — Inpatient Hospital Stay (HOSPITAL_COMMUNITY): Payer: Medicaid Other

## 2015-02-05 ENCOUNTER — Encounter (HOSPITAL_COMMUNITY)
Admission: EM | Disposition: A | Discharge status: Home Health | Payer: Self-pay | Source: Home / Self Care | Attending: Internal Medicine

## 2015-02-05 HISTORY — PX: SHOULDER ARTHROSCOPY: SHX128

## 2015-02-05 LAB — CBC
HCT: 25.3 % — ABNORMAL LOW (ref 39.0–52.0)
HEMOGLOBIN: 8.1 g/dL — AB (ref 13.0–17.0)
MCH: 27.5 pg (ref 26.0–34.0)
MCHC: 32 g/dL (ref 30.0–36.0)
MCV: 85.8 fL (ref 78.0–100.0)
Platelets: 318 10*3/uL (ref 150–400)
RBC: 2.95 MIL/uL — ABNORMAL LOW (ref 4.22–5.81)
RDW: 14.1 % (ref 11.5–15.5)
WBC: 4.7 10*3/uL (ref 4.0–10.5)

## 2015-02-05 LAB — GLUCOSE, CAPILLARY
Glucose-Capillary: 129 mg/dL — ABNORMAL HIGH (ref 70–99)
Glucose-Capillary: 136 mg/dL — ABNORMAL HIGH (ref 70–99)
Glucose-Capillary: 120 mg/dL — ABNORMAL HIGH (ref 70–99)
Glucose-Capillary: 262 mg/dL — ABNORMAL HIGH (ref 70–99)

## 2015-02-05 LAB — SURGICAL PCR SCREEN
MRSA, PCR: NEGATIVE
Staphylococcus aureus: NEGATIVE

## 2015-02-05 LAB — MAGNESIUM: Magnesium: 1.8 mg/dL (ref 1.5–2.5)

## 2015-02-05 SURGERY — ARTHROSCOPY, SHOULDER
Anesthesia: General | Laterality: Right

## 2015-02-05 MED ORDER — HYDROMORPHONE HCL 1 MG/ML IJ SOLN
0.5000 mg | INTRAMUSCULAR | Status: DC | PRN
  Administered 2015-02-05 – 2015-02-06 (×3): 1 mg via INTRAVENOUS
  Filled 2015-02-05 (×4): qty 1

## 2015-02-05 MED ORDER — BUPIVACAINE-EPINEPHRINE (PF) 0.5% -1:200000 IJ SOLN
INTRAMUSCULAR | Status: DC | PRN
  Administered 2015-02-05: 25 mL via PERINEURAL

## 2015-02-05 MED ORDER — SENNOSIDES-DOCUSATE SODIUM 8.6-50 MG PO TABS
1.0000 | ORAL_TABLET | Freq: Every evening | ORAL | Status: DC | PRN
  Filled 2015-02-05: qty 1

## 2015-02-05 MED ORDER — PROPOFOL 10 MG/ML IV BOLUS
INTRAVENOUS | Status: AC
  Filled 2015-02-05: qty 20

## 2015-02-05 MED ORDER — PHENYLEPHRINE HCL 10 MG/ML IJ SOLN
10.0000 mg | INTRAVENOUS | Status: DC | PRN
  Administered 2015-02-05: 25 ug/min via INTRAVENOUS

## 2015-02-05 MED ORDER — MIDAZOLAM HCL 2 MG/2ML IJ SOLN
INTRAMUSCULAR | Status: AC
  Filled 2015-02-05: qty 2

## 2015-02-05 MED ORDER — DOCUSATE SODIUM 100 MG PO CAPS
100.0000 mg | ORAL_CAPSULE | Freq: Two times a day (BID) | ORAL | Status: DC
  Administered 2015-02-05 – 2015-02-17 (×24): 100 mg via ORAL
  Filled 2015-02-05 (×25): qty 1

## 2015-02-05 MED ORDER — MIDAZOLAM HCL 5 MG/5ML IJ SOLN
INTRAMUSCULAR | Status: DC | PRN
  Administered 2015-02-05 (×2): 1 mg via INTRAVENOUS

## 2015-02-05 MED ORDER — OXYCODONE HCL 5 MG/5ML PO SOLN: 5.0000 mg | Freq: Once | ORAL | Status: DC | PRN

## 2015-02-05 MED ORDER — HYDROMORPHONE HCL 1 MG/ML IJ SOLN: 0.2500 mg | INTRAMUSCULAR | Status: DC | PRN

## 2015-02-05 MED ORDER — PROPOFOL 10 MG/ML IV BOLUS
INTRAVENOUS | Status: DC | PRN
  Administered 2015-02-05: 200 mg via INTRAVENOUS

## 2015-02-05 MED ORDER — ONDANSETRON HCL 4 MG/2ML IJ SOLN
INTRAMUSCULAR | Status: AC
  Filled 2015-02-05: qty 2

## 2015-02-05 MED ORDER — FENTANYL CITRATE 0.05 MG/ML IJ SOLN
INTRAMUSCULAR | Status: AC
  Filled 2015-02-05: qty 5

## 2015-02-05 MED ORDER — MAGNESIUM CITRATE PO SOLN: 1.0000 | Freq: Once | ORAL | Status: AC | PRN

## 2015-02-05 MED ORDER — METHOCARBAMOL 500 MG PO TABS
500.0000 mg | ORAL_TABLET | Freq: Four times a day (QID) | ORAL | Status: DC | PRN
  Administered 2015-02-05 – 2015-02-17 (×6): 500 mg via ORAL
  Filled 2015-02-05 (×7): qty 1

## 2015-02-05 MED ORDER — ONDANSETRON HCL 4 MG PO TABS: 4.0000 mg | ORAL_TABLET | Freq: Four times a day (QID) | ORAL | Status: DC | PRN

## 2015-02-05 MED ORDER — OXYCODONE-ACETAMINOPHEN 5-325 MG PO TABS
1.0000 | ORAL_TABLET | ORAL | Status: DC | PRN
Start: 2015-02-05 — End: 2015-02-18
  Administered 2015-02-05 – 2015-02-17 (×25): 2 via ORAL
  Filled 2015-02-05 (×25): qty 2

## 2015-02-05 MED ORDER — BUPIVACAINE HCL (PF) 0.25 % IJ SOLN
INTRAMUSCULAR | Status: AC
  Filled 2015-02-05: qty 30

## 2015-02-05 MED ORDER — ONDANSETRON HCL 4 MG/2ML IJ SOLN
INTRAMUSCULAR | Status: DC | PRN
  Administered 2015-02-05: 4 mg via INTRAVENOUS

## 2015-02-05 MED ORDER — METOCLOPRAMIDE HCL 5 MG/ML IJ SOLN
5.0000 mg | Freq: Three times a day (TID) | INTRAMUSCULAR | Status: DC | PRN
Start: 2015-02-05 — End: 2015-02-18

## 2015-02-05 MED ORDER — ALBUMIN HUMAN 5 % IV SOLN
INTRAVENOUS | Status: DC | PRN
  Administered 2015-02-05: 08:00:00 via INTRAVENOUS

## 2015-02-05 MED ORDER — METHOCARBAMOL 1000 MG/10ML IJ SOLN
500.0000 mg | Freq: Four times a day (QID) | INTRAVENOUS | Status: DC | PRN
  Filled 2015-02-05: qty 5

## 2015-02-05 MED ORDER — LACTATED RINGERS IV SOLN
INTRAVENOUS | Status: DC | PRN
  Administered 2015-02-05: 07:00:00 via INTRAVENOUS

## 2015-02-05 MED ORDER — FENTANYL CITRATE 0.05 MG/ML IJ SOLN
INTRAMUSCULAR | Status: DC | PRN
  Administered 2015-02-05: 50 ug via INTRAVENOUS
  Administered 2015-02-05: 100 ug via INTRAVENOUS

## 2015-02-05 MED ORDER — BISACODYL 5 MG PO TBEC
5.0000 mg | DELAYED_RELEASE_TABLET | Freq: Every day | ORAL | Status: DC | PRN
  Filled 2015-02-05 (×2): qty 1

## 2015-02-05 MED ORDER — LIDOCAINE HCL (CARDIAC) 20 MG/ML IV SOLN
INTRAVENOUS | Status: DC | PRN
  Administered 2015-02-05: 60 mg via INTRAVENOUS

## 2015-02-05 MED ORDER — METOCLOPRAMIDE HCL 5 MG PO TABS
5.0000 mg | ORAL_TABLET | Freq: Three times a day (TID) | ORAL | Status: DC | PRN
  Filled 2015-02-05: qty 2

## 2015-02-05 MED ORDER — PHENYLEPHRINE HCL 10 MG/ML IJ SOLN
INTRAMUSCULAR | Status: DC | PRN
  Administered 2015-02-05: 80 ug via INTRAVENOUS

## 2015-02-05 MED ORDER — SUCCINYLCHOLINE CHLORIDE 20 MG/ML IJ SOLN
INTRAMUSCULAR | Status: DC | PRN
  Administered 2015-02-05: 120 mg via INTRAVENOUS

## 2015-02-05 MED ORDER — ONDANSETRON HCL 4 MG/2ML IJ SOLN
4.0000 mg | Freq: Four times a day (QID) | INTRAMUSCULAR | Status: DC | PRN
  Filled 2015-02-05: qty 2

## 2015-02-05 MED ORDER — METHOCARBAMOL 1000 MG/10ML IJ SOLN
500.0000 mg | INTRAVENOUS | Status: DC
  Filled 2015-02-05 (×2): qty 5

## 2015-02-05 MED ORDER — OXYCODONE HCL 5 MG PO TABS: 5.0000 mg | ORAL_TABLET | Freq: Once | ORAL | Status: DC | PRN

## 2015-02-05 MED ORDER — ONDANSETRON HCL 4 MG/2ML IJ SOLN: 4.0000 mg | Freq: Once | INTRAMUSCULAR | Status: DC | PRN

## 2015-02-05 MED ORDER — CHLORHEXIDINE GLUCONATE 4 % EX LIQD
60.0000 mL | Freq: Once | CUTANEOUS | Status: AC
  Administered 2015-02-05: 4 via TOPICAL
  Filled 2015-02-05: qty 60

## 2015-02-05 MED ORDER — GLYCOPYRROLATE 0.2 MG/ML IJ SOLN
INTRAMUSCULAR | Status: DC | PRN
  Administered 2015-02-05: 0.2 mg via INTRAVENOUS

## 2015-02-05 MED ORDER — SODIUM CHLORIDE 0.9 % IV SOLN
INTRAVENOUS | Status: DC
  Administered 2015-02-05: 10 mL/h via INTRAVENOUS
  Administered 2015-02-07 – 2015-02-14 (×2): via INTRAVENOUS

## 2015-02-05 SURGICAL SUPPLY — 36 items
BLADE GREAT WHITE 4.2 (BLADE) ×2 IMPLANT
BLADE GREAT WHITE 4.2MM (BLADE) ×1
BUR OVAL 6.0 (BURR) ×3 IMPLANT
CANNULA SHOULDER 7CM (CANNULA) ×3 IMPLANT
COVER SURGICAL LIGHT HANDLE (MISCELLANEOUS) ×3 IMPLANT
DRAPE IMP U-DRAPE 54X76 (DRAPES) ×3 IMPLANT
DRAPE INCISE IOBAN 66X45 STRL (DRAPES) ×3 IMPLANT
DRAPE STERI 35X30 U-POUCH (DRAPES) ×3 IMPLANT
DRAPE U-SHAPE 47X51 STRL (DRAPES) ×3 IMPLANT
DRSG EMULSION OIL 3X3 NADH (GAUZE/BANDAGES/DRESSINGS) ×3 IMPLANT
DRSG PAD ABDOMINAL 8X10 ST (GAUZE/BANDAGES/DRESSINGS) ×3 IMPLANT
DURAPREP 26ML APPLICATOR (WOUND CARE) ×3 IMPLANT
GAUZE SPONGE 4X4 12PLY STRL (GAUZE/BANDAGES/DRESSINGS) ×3 IMPLANT
GLOVE BIOGEL PI IND STRL 9 (GLOVE) ×1 IMPLANT
GLOVE BIOGEL PI INDICATOR 9 (GLOVE) ×2
GLOVE SURG ORTHO 9.0 STRL STRW (GLOVE) ×3 IMPLANT
GOWN STRL REUS W/ TWL XL LVL3 (GOWN DISPOSABLE) ×2 IMPLANT
GOWN STRL REUS W/TWL XL LVL3 (GOWN DISPOSABLE) ×4
KIT BASIN OR (CUSTOM PROCEDURE TRAY) ×3 IMPLANT
KIT ROOM TURNOVER OR (KITS) ×3 IMPLANT
MANIFOLD NEPTUNE II (INSTRUMENTS) ×3 IMPLANT
NEEDLE SPNL 18GX3.5 QUINCKE PK (NEEDLE) ×3 IMPLANT
NS IRRIG 1000ML POUR BTL (IV SOLUTION) ×3 IMPLANT
PACK SHOULDER (CUSTOM PROCEDURE TRAY) ×3 IMPLANT
PACK UNIVERSAL I (CUSTOM PROCEDURE TRAY) ×3 IMPLANT
PAD ARMBOARD 7.5X6 YLW CONV (MISCELLANEOUS) ×6 IMPLANT
SET ARTHROSCOPY TUBING (MISCELLANEOUS) ×2
SET ARTHROSCOPY TUBING LN (MISCELLANEOUS) ×1 IMPLANT
SLING ARM IMMOBILIZER XL (CAST SUPPLIES) ×3 IMPLANT
SPONGE GAUZE 4X4 12PLY STER LF (GAUZE/BANDAGES/DRESSINGS) ×3 IMPLANT
SPONGE LAP 4X18 X RAY DECT (DISPOSABLE) ×3 IMPLANT
SUT ETHILON 2 0 FS 18 (SUTURE) ×3 IMPLANT
TAPE CLOTH SURG 6X10 WHT LF (GAUZE/BANDAGES/DRESSINGS) ×3 IMPLANT
TOWEL OR 17X24 6PK STRL BLUE (TOWEL DISPOSABLE) ×6 IMPLANT
WAND HAND CNTRL MULTIVAC 90 (MISCELLANEOUS) IMPLANT
WATER STERILE IRR 1000ML POUR (IV SOLUTION) ×3 IMPLANT

## 2015-02-05 NOTE — Anesthesia Preprocedure Evaluation (Addendum)
Anesthesia Evaluation  Patient identified by MRN, date of birth, ID band Patient awake    Reviewed: Allergy & Precautions, NPO status , Patient's Chart, lab work & pertinent test results  Airway Mallampati: I  TM Distance: >3 FB Neck ROM: Full    Dental  (+) Teeth Intact, Dental Advisory Given   Pulmonary former smoker,  breath sounds clear to auscultation        Cardiovascular hypertension, Pt. on medications + Valvular Problems/Murmurs MR Rhythm:Regular Rate:Normal     Neuro/Psych    GI/Hepatic   Endo/Other  diabetes, Well Controlled, Type 2, Insulin DependentMorbid obesity  Renal/GU      Musculoskeletal   Abdominal   Peds  Hematology   Anesthesia Other Findings   Reproductive/Obstetrics                            Anesthesia Physical Anesthesia Plan  ASA: III  Anesthesia Plan: General   Post-op Pain Management:    Induction: Intravenous  Airway Management Planned: Oral ETT  Additional Equipment:   Intra-op Plan:   Post-operative Plan: Extubation in OR  Informed Consent: I have reviewed the patients History and Physical, chart, labs and discussed the procedure including the risks, benefits and alternatives for the proposed anesthesia with the patient or authorized representative who has indicated his/her understanding and acceptance.   Dental advisory given  Plan Discussed with: CRNA, Anesthesiologist and Surgeon  Anesthesia Plan Comments:         Anesthesia Quick Evaluation

## 2015-02-05 NOTE — Progress Notes (Addendum)
Patient ID: Dwayne [name], male   DOB: April 27, 1980, 35 y.o.   MRN: [id number]    Patient Name: Dwayne [name] Date of Encounter: 02/05/2015     Principal Problem:   Acute diastolic CHF (congestive heart failure), NYHA class 3 Active Problems:   Essential hypertension, benign   Diabetes type 2, uncontrolled   Acute renal failure   Mitral regurgitation   Endocarditis   PVD (peripheral vascular disease)   Hypoalbuminemia   Anasarca   Arthritis, septic, shoulder   Acute pericarditis   Right shoulder pain    SUBJECTIVE  Answers questions but not speaking. Shoulder sore.    CURRENT MEDS . ampicillin (OMNIPEN) IV  2 g Intravenous Q4H  . docusate sodium  100 mg Oral BID  . feeding supplement (ENSURE COMPLETE)  237 mL Oral TID WC  . furosemide  80 mg Intravenous BID  . hydrALAZINE  50 mg Oral 3 times per day  . Influenza vac split quadrivalent PF  0.5 mL Intramuscular Tomorrow-1000  . insulin aspart  0-9 Units Subcutaneous TID WC  . metoprolol tartrate  100 mg Oral BID  . nitroGLYCERIN  0.5 inch Topical 4 times per day  . sodium chloride  3 mL Intravenous Q12H    OBJECTIVE  Filed Vitals:   02/05/15 0515 02/05/15 0915 02/05/15 0930 02/05/15 0945  BP: 149/82 199/92 178/88 179/87  Pulse: 77 84 80 81  Temp: 98.8 F (37.1 C) 98.7 F (37.1 C)  98.6 F (37 C)  TempSrc: Oral     Resp: 18 19 12 20   Height:      Weight: 262 lb 5.6 oz (119 kg)     SpO2: 97% 97% 96% 96%    Intake/Output Summary (Last 24 hours) at 02/05/15 1138 Last data filed at 02/05/15 0900  Gross per 24 hour  Intake   1090 ml  Output   3575 ml  Net  -2485 ml   Filed Weights   02/03/15 0841 02/04/15 0428 02/05/15 0515  Weight: 263 lb 6.4 oz (119.477 kg) 263 lb (119.296 kg) 262 lb 5.6 oz (119 kg)    PHYSICAL EXAM  General: Pleasant, NAD. Neuro: Alert and oriented X 3. Moves all extremities spontaneously. Psych: Normal affect. HEENT:  Normal  Neck: Supple without bruits or JVD. Lungs:  Resp  regular and unlabored, CTA. Heart: RRR no s3, s4, or murmurs. Abdomen: Soft, non-tender, non-distended, BS + x 4.  Extremities: No clubbing, cyanosis or edema. DP/PT/Radials 2+ and equal bilaterally.  Accessory Clinical Findings  CBC  Recent Labs  02/04/15 0545 02/05/15 0500  WBC 4.6 4.7  HGB 8.1* 8.1*  HCT 25.2* 25.3*  MCV 87.5 85.8  PLT 292 318   Basic Metabolic Panel  Recent Labs  02/03/15 0550 02/04/15 0545 02/05/15 0500  NA 139 141  --   K 3.5 3.6  --   CL 107 106  --   CO2 28 27  --   GLUCOSE 126* 142*  --   BUN 13 16  --   CREATININE 1.78* 1.81*  --   CALCIUM 7.8* 7.8*  --   MG  --  1.9 1.8   Liver Function Tests No results for input(s): AST, ALT, ALKPHOS, BILITOT, PROT, ALBUMIN in the last 72 hours. No results for input(s): LIPASE, AMYLASE in the last 72 hours. Cardiac Enzymes No results for input(s): CKTOTAL, CKMB, CKMBINDEX, TROPONINI in the last 72 hours. BNP Invalid input(s): POCBNP D-Dimer No results for input(s): DDIMER in the last 72 hours. Hemoglobin  A1C No results for input(s): HGBA1C in the last 72 hours. Fasting Lipid Panel No results for input(s): CHOL, HDL, LDLCALC, TRIG, CHOLHDL, LDLDIRECT in the last 72 hours. Thyroid Function Tests  Recent Labs  02/04/15 0545  TSH 2.046    TELE  nsr  Radiology/Studies  Dg Orthopantogram  01/14/2015   CLINICAL DATA:  Staphylococcal endocarditis.  Poor dentition.  EXAM: ORTHOPANTOGRAM/PANORAMIC  COMPARISON:  None.  FINDINGS: Extensive dental disease with caries involving multiple teeth, including numbers 7, 8, 9, 10, 11, 12, 13, 14, 15, 16, 19, 20, 29, 30, 31 and 32. Periapical lucencies are present adjacent to teeth numbers 29 and 30.  IMPRESSION: Extensive dental disease as described.   Electronically Signed   By: Hulan Saas M.D.   On: 01/14/2015 21:31   Dg Chest 1 View  01/22/2015   CLINICAL DATA:  CHF, increasing right-sided chest pain  EXAM: CHEST - 1 VIEW  COMPARISON:  01/14/2015   FINDINGS: Cardiac shadow is mildly enlarged. Bibasilar atelectatic changes and effusions are seen slightly increased from the prior exam. No other focal abnormality is noted.  IMPRESSION: Slight increase in bibasilar changes.   Electronically Signed   By: Alcide Clever M.D.   On: 01/22/2015 09:57   Dg Chest 2 View  02/02/2015   CLINICAL DATA:  left sided chest pain and SOB x today. Pt also c/o swelling of legs, arms and abdomen x a few weeks and a cough and congestion x a few days. Hx HTN controlled with medication, diabetes, and ex-smoker as of 01/09/15.  EXAM: CHEST - 2 VIEW  COMPARISON:  01/26/2015  FINDINGS: Patchy airspace opacities in both upper lobes, right greater than left, and at the left lung base. Left arm PICC line to the distal SVC. Small pleural effusions. Mild cardiomegaly stable. Visualized skeletal structures are unremarkable.  IMPRESSION: 1. New patchy airspace infiltrates in both upper lobes and at the left lung base, suggesting multifocal pneumonia versus atypical edema. 2. Stable cardiomegaly and small pleural effusions. 3. PICC line to the distal SVC.   Electronically Signed   By: Oley Balm M.D.   On: 02/02/2015 11:52   Dg Chest 2 View  01/14/2015   CLINICAL DATA:  Right shoulder pain for 6 days.  EXAM: CHEST  2 VIEW  COMPARISON:  01/10/2015  FINDINGS: Two views of the chest demonstrate low lung volumes with bibasilar chest densities, left side greater than right. In addition, the heart appears to be enlarged. The configuration of the heart raises the possibility for pericardial fluid. No evidence for pulmonary edema. Difficult to exclude small effusions, particularly on the left side.  IMPRESSION: Low lung volumes with bibasilar densities, left side greater than right. Basilar densities could represent a combination of atelectasis and pleural fluid.  Heart appears enlarged which may be accentuated by low lung volumes. Pericardial fluid cannot be excluded.   Electronically Signed    By: Richarda Overlie M.D.   On: 01/14/2015 08:17   Dg Shoulder Right  01/09/2015   CLINICAL DATA:  Anterior right shoulder pain  EXAM: RIGHT SHOULDER - 2+ VIEW  COMPARISON:  None.  FINDINGS: No fracture or dislocation is seen.  The joint spaces are preserved.  The visualized soft tissues are unremarkable.  Visualized right lung is clear.  IMPRESSION: No fracture or dislocation is seen.   Electronically Signed   By: Charline Bills M.D.   On: 01/09/2015 09:44   Mr Brain Wo Contrast  01/14/2015   CLINICAL DATA:  35 year old  male with history of staph aureus bacteremia and endocarditis.  EXAM: MRI HEAD WITHOUT CONTRAST  TECHNIQUE: Multiplanar, multiecho pulse sequences of the brain and surrounding structures were obtained without intravenous contrast.  COMPARISON:  Prior study from 04/25/2012  FINDINGS: Cerebral volume within normal limits for patient age. No significant chronic small vessel ischemic changes identified. No mass lesion, midline shift, or mass effect. Ventricles are normal in size without evidence of hydrocephalus. No extra-axial fluid collection.  There are multiple subcentimeter ischemic infarcts involving predominantly the supratentorial brain. These involve both frontal lobes, the right parietal lobe, left temporal lobe, ischemic infarct within the right is cerebral hemisphere is located in the anterior right frontal lobe and measures 5 mm (series 3, image 36). The largest infarct in the left cerebral hemisphere is located in the deep white matter of the anterior left frontal lobe and measures 13 mm (series 3, image 30). There are infarcts involving the bilateral basal ganglia, the most prominent of which located in the right thalamus which measures 4 mm. There is a single infratentorial infarct within the left cerebellar hemisphere measuring 4 mm. These are most likely embolic in nature given the history of endocarditis and multiple vascular distributions. Scattered susceptibility artifact seen  associated with a few of these infarcts, likely reflecting small petechial hemorrhages. No frank hemorrhagic conversion There is associated T2/FLAIR signal intensity without significant mass effect.  Craniocervical junction within normal limits. Pituitary gland normal.  No acute abnormality seen about the orbits.  Paranasal sinuses are clear.  No mastoid effusion.  Bone marrow signal intensity within normal limits. Visualized upper cervical spine unremarkable.  Scalp soft tissues within normal limits.  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 These are likely embolic in nature given the history of endocarditis and involvement of multiple vascular distributions. No significant mass effect. There are likely scattered small petechial hemorrhages with a number these infarcts without frank hemorrhage.   Electronically Signed   By: Rise MuBenjamin  McClintock M.D.   On: 01/14/2015 21:24   Mr Thoracic Spine Wo Contrast  01/15/2015   ADDENDUM REPORT: 01/15/2015 14:40  ADDENDUM: Study discussed by telephone with Dr. Paulette BlanchORNELIUS VAN DAM on 01/15/2015 at 1435 hrs.   Electronically Signed   By: Augusto GambleLee  Hall M.D.   On: 01/15/2015 14:40   01/15/2015   CLINICAL DATA:  35 year old male with sepsis and back pain. Into card itis, purulent pericarditis, multiple small embolic infarcts. Initial encounter.  EXAM: MRI THORACIC AND LUMBAR SPINE WITHOUT CONTRAST  TECHNIQUE: Multiplanar and multiecho pulse sequences of the thoracic and lumbar spine were obtained without intravenous contrast.  COMPARISON:  Chest radiographs 01/14/2015. CT Abdomen and Pelvis 05/18/2013.  FINDINGS: MR THORACIC SPINE FINDINGS  Limited sagittal imaging of the cervical spine is unremarkable.  Preserved thoracic vertebral height and alignment, preserved thoracic vertebral height and alignment.  Throughout much of the T3 and T4 vertebral bodies there is heterogeneous T2 signal as well as patchy pre STIR  signal. The STIR images were repeated, an the same patchy increased signal persisted at T3 and T4.  No definite signal abnormality in the intervening disc, or adjacent disc spaces; there is widespread intervertebral disc T2 hyperintensity, as a normal finding seen in young patients.  No definite paraspinal or epidural inflammation at those levels.  No suspicious marrow signal elsewhere; intrinsic T1 and T2 hyperintensity at T6 is a benign vertebral body hemangioma.  However, there are layering pleural effusions left greater than right. Negative visualized upper abdominal  viscera.  Epidural lipomatosis throughout the thoracic spine and effaces CSF from the thecal sac from the T5 to the T9 level. No definite spinal cord signal abnormality.  MR LUMBAR SPINE FINDINGS  In the lumbar spine T1-T2 and stir marrow signal appears diminished. This is also true in the visible pelvis. Possible associated decreased T2 signal in both the liver and spleen.  Otherwise negative visualized abdominal viscera.  There is mildly increased STIR signal in the lower lumbar erector spinae muscles bilaterally, at L4-L5 (series 17, images 9 and 1). Subcutaneous edema incidentally noted.  Normal lumbar vertebral height and alignment and no marrow edema or acute osseous abnormality in the lumbar spine.  No definite signal abnormality in the lower thoracic spinal cord, conus at L1-L2.  Lumbar facet hypertrophy.  No lumbar spinal stenosis.  IMPRESSION: MR THORACIC SPINE IMPRESSION  1. Abnormal marrow edema in the T3 and T4 vertebral bodies highly suspicious for acute osteomyelitis in this setting. No associated epidural or paraspinal inflammation identified. 2. No other acute findings in the thoracic spine. Epidural lipomatosis. 3. Left greater than right layering pleural effusions.  MR LUMBAR SPINE IMPRESSION  1. Mild increased signal in the L4-L5 level erector spinae muscles bilaterally, which could be infectious myositis but also this appearance  can be seen without infection in chronically debilitated patients. 2. No other acute finding in the lumbar spine. 3. Decreased bone marrow signal suggestive of red marrow conversion versus hemosiderosis.  Electronically Signed: By: Augusto [name] M.D. On: 01/15/2015 14:20   Mr Lumbar Spine Wo Contrast  01/15/2015   ADDENDUM REPORT: 01/15/2015 14:40  ADDENDUM: Study discussed by telephone with Dr. Paulette Blanch DAM on 01/15/2015 at 1435 hrs.   Electronically Signed   By: Augusto [name] M.D.   On: 01/15/2015 14:40   01/15/2015   CLINICAL DATA:  35 year old male with sepsis and back pain. Into card itis, purulent pericarditis, multiple small embolic infarcts. Initial encounter.  EXAM: MRI THORACIC AND LUMBAR SPINE WITHOUT CONTRAST  TECHNIQUE: Multiplanar and multiecho pulse sequences of the thoracic and lumbar spine were obtained without intravenous contrast.  COMPARISON:  Chest radiographs 01/14/2015. CT Abdomen and Pelvis 05/18/2013.  FINDINGS: MR THORACIC SPINE FINDINGS  Limited sagittal imaging of the cervical spine is unremarkable.  Preserved thoracic vertebral height and alignment, preserved thoracic vertebral height and alignment.  Throughout much of the T3 and T4 vertebral bodies there is heterogeneous T2 signal as well as patchy pre STIR signal. The STIR images were repeated, an the same patchy increased signal persisted at T3 and T4.  No definite signal abnormality in the intervening disc, or adjacent disc spaces; there is widespread intervertebral disc T2 hyperintensity, as a normal finding seen in young patients.  No definite paraspinal or epidural inflammation at those levels.  No suspicious marrow signal elsewhere; intrinsic T1 and T2 hyperintensity at T6 is a benign vertebral body hemangioma.  However, there are layering pleural effusions left greater than right. Negative visualized upper abdominal viscera.  Epidural lipomatosis throughout the thoracic spine and effaces CSF from the thecal sac from the T5 to  the T9 level. No definite spinal cord signal abnormality.  MR LUMBAR SPINE FINDINGS  In the lumbar spine T1-T2 and stir marrow signal appears diminished. This is also true in the visible pelvis. Possible associated decreased T2 signal in both the liver and spleen.  Otherwise negative visualized abdominal viscera.  There is mildly increased STIR signal in the lower lumbar erector spinae muscles bilaterally, at L4-L5 (series 17,  images 9 and 1). Subcutaneous edema incidentally noted.  Normal lumbar vertebral height and alignment and no marrow edema or acute osseous abnormality in the lumbar spine.  No definite signal abnormality in the lower thoracic spinal cord, conus at L1-L2.  Lumbar facet hypertrophy.  No lumbar spinal stenosis.  IMPRESSION: MR THORACIC SPINE IMPRESSION  1. Abnormal marrow edema in the T3 and T4 vertebral bodies highly suspicious for acute osteomyelitis in this setting. No associated epidural or paraspinal inflammation identified. 2. No other acute findings in the thoracic spine. Epidural lipomatosis. 3. Left greater than right layering pleural effusions.  MR LUMBAR SPINE IMPRESSION  1. Mild increased signal in the L4-L5 level erector spinae muscles bilaterally, which could be infectious myositis but also this appearance can be seen without infection in chronically debilitated patients. 2. No other acute finding in the lumbar spine. 3. Decreased bone marrow signal suggestive of red marrow conversion versus hemosiderosis.  Electronically Signed: By: Augusto [name] M.D. On: 01/15/2015 14:20   US Renal  01/12/2015   CLINICAL DATA:  Acute renal failure  EXAM: RENAL/URINARY TRACT ULTRASOUND COMPLETE  COMPARISON:  None.  FINDINGS: Right Kidney:  Length: 12.8 cm in length. Diffuse increased cortical echogenicity suspicious for medical renal disease. No hydronephrosis. No renal calculus.  Left Kidney:  Length: . 12.6 cm in length. Diffuse increased echogenicity suspicious for medical renal disease. No  hydronephrosis. No diagnostic renal calculus.  Bladder:  Appears normal for degree of bladder distention.  IMPRESSION: Bilateral kidneys shows diffuse increased echogenicity. Medical renal disease cannot be excluded. No hydronephrosis or diagnostic renal calculus.   Electronically Signed   By: Natasha Mead M.D.   On: 01/12/2015 16:08   Mr Shoulder Right Wo Contrast  02/04/2015   CLINICAL DATA:  Septic arthritis of the right shoulder.  EXAM: MRI OF THE RIGHT SHOULDER WITHOUT CONTRAST  TECHNIQUE: Multiplanar, multisequence MR imaging of the shoulder was performed. No intravenous contrast was administered.  COMPARISON:  MRI dated 01/11/2015 and radiographs dated 01/09/2015  FINDINGS: Glenohumeral Joint: There is a large glenohumeral joint effusion with some debris in the joint. These have increased since the prior exam.  Bones: There is new edema in the head of the humerus around the superior aspect of the bicipital groove. There is no bone destruction but given the patient's known septic joint, the possibility of early osteomyelitis must be considered.  Rotator cuff:  Normal.  Muscles: There is slight edema in and around all of the muscles of the rotator cuff. Some of this appears to be extravasated joint fluid. In the setting of infection, this could represent infectious myositis.  Biceps long head: Properly located and intact. Fluid distends the bicipital tendon sheath.  Acromioclavicular Joint: Slight arthritic changes of the AC joint, stable. New small joint effusion.  Labrum:  Normal.  IMPRESSION: 1. Increased glenohumeral joint effusion with extravasated joint fluid extending around the muscles of the rotator cuff with edema in the muscles. The findings are worrisome for persistent infection in the joint as well as adjacent infectious myositis. 2. New edema in the humeral head adjacent to the bicipital groove. In the setting of septic joint, the possibility of early osteomyelitis has to be considered. 3. New  small effusion in the acromioclavicular joint. Given the patient's sepsis, the possibility of infection in the [facility] joint mass be considered.   Electronically Signed   By: Geanie Cooley M.D.   On: 02/04/2015 12:13   Mr Shoulder Right Wo Contrast  01/11/2015  CLINICAL DATA:  Right shoulder pain.  EXAM: MRI OF THE RIGHT SHOULDER WITHOUT CONTRAST  TECHNIQUE: Multiplanar, multisequence MR imaging of the shoulder was performed. No intravenous contrast was administered.  COMPARISON:  Radiographs dated 01/09/2015  FINDINGS: Rotator cuff:  Normal.  Muscles:  Normal.  Biceps long head: Properly located and intact. There is inflammation in the soft tissues around the bicipital tendon sheath with fluid in the bicipital tendon sheath.  Acromioclavicular Joint: Small degenerative cyst in the acromion. Otherwise normal AC joint. Type 1 acromion. No bursitis.  Glenohumeral Joint: There is a moderate glenohumeral joint effusion with extravasation of contrast from the subcoracoid recess of the joint into the adjacent soft tissues. Fluid distends the bicipital tendon sheath.  Labrum:  Intact.  Bones: No significant abnormalities. Tiny degenerative cystic areas in the posterior aspect of the greater tuberosity of the proximal humerus.  IMPRESSION: 1. Moderate glenohumeral joint effusion with some extravasation of joint. Fluid distends the bicipital tendon sheath with inflammation/extravasated joint fluid around the bicipital tendon sheath. The possibly of synovitis should be considered. 2. Normal rotator cuff.   Electronically Signed   By: Geanie Cooley M.D.   On: 01/11/2015 11:21   Mr Tibia Fibula Right Wo Contrast  01/15/2015   CLINICAL DATA:  Diabetic patient with bacteremia and right leg pain.  EXAM: MRI OF LOWER RIGHT EXTREMITY WITHOUT CONTRAST  TECHNIQUE: Multiplanar, multisequence MR imaging of the right lower leg was performed. No intravenous contrast was administered.  COMPARISON:  None.  FINDINGS: The axial and coronal  sequences incidentally include the left lower leg. There is marked subcutaneous edema of both lower legs which appears slightly worse on the left. No focal subcutaneous fluid collection is identified. Mildly increased T2 signal is seen in the gastrocnemius, soleus and and tibialis posterior muscles bilaterally which appears symmetric. No intramuscular fluid collection is identified. No bone marrow signal abnormality to suggest osteomyelitis is seen. There is no muscle or tendon tear.  IMPRESSION: Intense bilateral lower leg subcutaneous edema appears worse on the left and could be due to dependent change or cellulitis.  Mildly increased T2 signal in the gastrocnemius, soleus and tibialis posterior muscles bilaterally could be due to myositis or early changes of denervation atrophy in this diabetic patient.  Negative for abscess or osteomyelitis.   Electronically Signed   By: Drusilla Kanner M.D.   On: 01/15/2015 08:28   Mr Foot Right Wo Contrast  01/11/2015   CLINICAL DATA:  Diabetic foot ulcers.  EXAM: MRI OF THE RIGHT FOREFOOT WITHOUT CONTRAST  TECHNIQUE: Multiplanar, multisequence MR imaging was performed. No intravenous contrast was administered.  COMPARISON:  Radiographs dated 01/09/2011  FINDINGS: There are small erosions of the proximal phalanx at the IP joint of the great toe and there are small erosions at the first metatarsal phalangeal joint, both consistent with gout. There are no soft tissue abscesses. There is no osteomyelitis. There is nonspecific edema in the subcutaneous fat of the dorsum of the foot. Muscles and tendons and ligaments appear appear normal. No joint effusions.  IMPRESSION: Subcutaneous edema primarily on the dorsum of the foot. 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   Electronically Signed   By: Geanie Cooley M.D.   On: 01/11/2015 12:50   Mr Femur Left Wo Contrast  01/12/2015   CLINICAL DATA:  Diabetes. Peripheral neuropathy. Sepsis. Bilateral thigh pain. RIGHT thigh pain.  Tenderness in the medial aspect of the thigh.  EXAM: MR OF THE LEFT FEMUR WITHOUT CONTRAST  TECHNIQUE:  Multiplanar, multisequence MR imaging was performed. No intravenous contrast was administered.  COMPARISON:  None.  FINDINGS: Diffuse subcutaneous edema is present around the pelvis and in both thighs, most compatible with anasarca. This could also represent cellulitis in the appropriate clinical setting. Subcutaneous fluid is present in the lateral LEFT thigh, without a discrete abscess. There is myositis of the LEFT vastus lateralis with mild muscular edema av but no significant swelling. No fatty atrophy.  Bone marrow signal shows heterogenous marrow. This is a nonspecific finding most commonly associated with obesity, anemia, cigarette smoking or chronic disease. Negative for hip fracture or AVN. There is no evidence of osteomyelitis. No deep soft tissue abscesses are present.  Bilaterally in the groin, there are prominent lymph nodes which are probably reactive to lower extremity process.  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  IMPRESSION: 1. Diffuse subcutaneous edema in the pelvis and thighs bilaterally, suggesting anasarca. 2. More prominent subcutaneous edema along the LEFT thigh with subcutaneous fluid superficial to the muscular fascia, likely representing cellulitis. No abscess. 3. Myositis of the proximal vastus lateralis, probably infectious. Reactive edema could produce this appearance as well. 4. Reactive inguinal adenopathy bilaterally. 5. Negative for osteomyelitis.   Electronically Signed   By: Andreas Newport M.D.   On: 01/12/2015 14:33   Mr Foot Left Wo Contrast  01/11/2015   CLINICAL DATA:  Diabetic foot ulcers.  Foot pain.  Bony erosions.  EXAM: MRI OF THE LEFT FOREFOOT WITHOUT CONTRAST  TECHNIQUE: Multiplanar, multisequence MR imaging was performed. No intravenous contrast was administered.  COMPARISON:   Radiograph dated 01/09/2015  FINDINGS: There is slight edema throughout the medial cuneiform. There is artifactual increased signal from the metatarsal heads in from the proximal phalanx of the great toe. There are erosions at IP joint and first metatarsal phalangeal joint consistent with gout.  There are no soft tissue abscesses and there is no osteomyelitis. There is prominent edema in the subcutaneous fat of the dorsum of the foot.  IMPRESSION: No evidence of osteomyelitis or abscess. The edema in the medial cuneiform is most likely a stress reaction. 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   Electronically Signed   By: Geanie Cooley M.D.   On: 01/11/2015 12:15   Dg Chest Port 1 View  01/26/2015   CLINICAL DATA:  Shortness of breath.  Feels choked when lying flat.  EXAM: PORTABLE CHEST - 1 VIEW  COMPARISON:  01/22/2015  FINDINGS: Cardiac enlargement without vascular congestion. Shallow inspiration with mild atelectasis in the lung bases. Small bilateral pleural effusion. No pneumothorax. No change since prior study.  IMPRESSION: Cardiac enlargement. Atelectasis in the lung bases. Small bilateral pleural effusion.   Electronically Signed   By: Burman Nieves M.D.   On: 01/26/2015 05:55   Dg Chest Port 1 View  01/10/2015   CLINICAL DATA:  Fever and sweats.  EXAM: PORTABLE CHEST - 1 VIEW  COMPARISON:  None.  FINDINGS: Low lung volumes with vascular crowding and streaky subsegmental atelectasis. No obvious infiltrate or effusion. The heart is within normal limits in size given the AP portable technique. The bony structures are grossly normal.  IMPRESSION: Low lung volumes with vascular crowding and bibasilar atelectasis.   Electronically Signed   By: Loralie Champagne M.D.   On: 01/10/2015 11:27   Dg Foot Complete Left  01/09/2015   CLINICAL DATA:  35 year old male with recurrent bilateral foot  pain.  EXAM: LEFT FOOT - COMPLETE 3+ VIEW  COMPARISON:  01/05/2014.  FINDINGS:  Large periarticular erosions are noted adjacent to the first MTP joint, and at the first interphalangeal joint along the medial margin in both locations. These have erosions have sclerotic margins and overhanging edges, and there is adjacent soft tissue prominence, suggestive of gouty erosions. No adjacent soft tissue calcification is noted. No acute displaced fracture, subluxation or dislocation. Small heterotopic ossification adjacent to the plantar aspect of the calcaneus, likely ossification within the plantar fascia.  IMPRESSION: 1. Findings, as above, suggestive of underlying gout. Clinical correlation is recommended.   Electronically Signed   By: Trudie Reed M.D.   On: 01/09/2015 09:46   Dg Foot Complete Right  01/09/2015   CLINICAL DATA:  Recurring bilateral foot pain  EXAM: RIGHT FOOT COMPLETE - 3+ VIEW  COMPARISON:  None.  FINDINGS: No fracture or dislocation is seen.  Marginal erosion at the medial aspect of the 1st IP joint, involving the proximal phalanx.  The visualized soft tissues are unremarkable.  IMPRESSION: Marginal erosion at the medial aspect of the 1st IP joint. Correlate for inflammatory arthropathy such as gout.  Otherwise, no acute osseus abnormality is seen.   Electronically Signed   By: Charline Bills M.D.   On: 01/09/2015 09:44   Dg Fluoro Guide Ndl Plc/bx  01/14/2015   CLINICAL DATA:  Right shoulder pain and effusion.  Bacteremia.  EXAM: RIGHT SHOULDER ASPIRATION UNDER FLUOROSCOPY  FLUOROSCOPY TIME:  0 min 24 seconds  PROCEDURE: Overlying skin prepped with Betadine, draped in the usual sterile fashion, and infiltrated locally with buffered Lidocaine. Twenty gauge spinal needle was placed in the right glenohumeral joint under direct fluoroscopic visualization.  2 cc of bloody cloudy fluid was aspirated from the joint. Mr. Ates tolerated the procedure well.  IMPRESSION: Technically successful right shoulder aspiration under fluoroscopy. Fluid sent for laboratory  analysis.   Electronically Signed   By: Geanie Cooley M.D.   On: 01/14/2015 09:30    ASSESSMENT AND PLAN  1. S/p shoulder I&D and debridement 2. Endocarditis 3. Acute diastolic heart failure - well compensated, but still with peripheral edema 4. HTN Rec: continue supportive care. Timing of heart surgery per Dr. Cornelius Moras.  Gregg Taylor,M.D.  02/05/2015 11:38 AM

## 2015-02-05 NOTE — Interval H&P Note (Signed)
History and Physical Interval Note:  02/05/2015 7:24 AM  Dwayne HickmanBenjamin Knoop  has presented today for surgery, with the diagnosis of SEPTIC RIGHT SHOULDER  The various methods of treatment have been discussed with the patient and family. After consideration of risks, benefits and other options for treatment, the patient has consented to  Procedure(s): ARTHROSCOPY SHOULDER (Right) as a surgical intervention .  The patient's history has been reviewed, patient examined, no change in status, stable for surgery.  I have reviewed the patient's chart and labs.  Questions were answered to the patient's satisfaction.     DUDA,MARCUS V

## 2015-02-05 NOTE — Anesthesia Postprocedure Evaluation (Signed)
  Anesthesia Post-op Note  Patient: Dwayne HickmanBenjamin [name]  Procedure(s) Performed: Procedure(s): ARTHROSCOPY SHOULDER (Right)  Patient Location: PACU  Anesthesia Type: General with regional   Level of Consciousness: awake, alert  and oriented  Airway and Oxygen Therapy: Patient Spontanous Breathing  Post-op Pain: none  Post-op Assessment: Post-op Vital signs reviewed  Post-op Vital Signs: Reviewed  Last Vitals:  Filed Vitals:   02/05/15 0945  BP: 179/87  Pulse: 81  Temp: 37 C  Resp: 20    Complications: No apparent anesthesia complications

## 2015-02-05 NOTE — Transfer of Care (Signed)
Immediate Anesthesia Transfer of Care Note  Patient: Dwayne HickmanBenjamin Reihl  Procedure(s) Performed: Procedure(s): ARTHROSCOPY SHOULDER (Right)  Patient Location: PACU  Anesthesia Type:General  Level of Consciousness: patient cooperative and responds to stimulation  Airway & Oxygen Therapy: Patient Spontanous Breathing and Patient connected to face mask oxygen  Post-op Assessment: Report given to RN, Post -op Vital signs reviewed and stable and Patient moving all extremities X 4  Post vital signs: Reviewed and stable  Last Vitals:  Filed Vitals:   02/05/15 0515  BP: 149/82  Pulse: 77  Temp: 37.1 C  Resp: 18    Complications: No apparent anesthesia complications

## 2015-02-05 NOTE — Progress Notes (Signed)
PROGRESS NOTE  Dwayne HickmanBenjamin [name] ZOX:[id number] DOB: 03/15/1980 DOA: 02/02/2015 PCP: No PCP Per Patient  HPI/Recap of past 24 hours: Back from OR, s/p right shoulder washout. Flat affect.  Does not want to engage care actively, states his wife is a CNA , normally take cares of his medical needs and information.  Assessment/Plan: Principal Problem:   Acute diastolic CHF (congestive heart failure), NYHA class 3 Active Problems:   Essential hypertension, benign   Diabetes type 2, uncontrolled   Acute renal failure   Mitral regurgitation   Endocarditis   PVD (peripheral vascular disease)   Hypoalbuminemia   Anasarca   Arthritis, septic, shoulder   Acute pericarditis   Right shoulder pain 1. Acute on chronic diastolic dysfunction EF 60%. 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 stepdown unit, IV Lasix 80 mg twice a day one dose now, nitro paste, continue beta blocker, R Dwayne [name] board. Cardura 6 surgery consulted by cardiology. Blood cultures drawn to make sure no ongoing bacteremia. Continue IV ampicillin dosed by pharmacy for now. Infectious disease consulted. Has left arm PICC line. Supportive care with oxygen and nebulizer treatments.  2. Right shoulder septic arthritis. Seen by orthopedics last admission. IV antibiotics. No surgical intervention planned by orthopedics during last admission.   Mri shoulder on 2/5 concerning for septic arthritis/osteo? [facility] Dr. Lajoyce Cornersuda called and recommended imaging guided joint aspiration, he will see patient later today or tomorrow. Discussed with [facility] radiology MD, agree with proceed right shoulder joint aspiration today. ([phone]) 2/6 patient was brought to the OR instead to have Right shoulder ARTHROSCOPY SHOULDER  Extensive debridement of the glenohumeral joint. Subacromial decompression. Ortho input appreciated.  3. Dental caries. Multiple teeth removed one week ago prior to this admission. Still requiring prn  pain meds.  4. DM type II. Last A1c was above 8. A new home dose 70-30, add sliding scale.    5. Essential hypertension. Home medications to continue. Add Nitropaste and as needed IV hydrazine for better control.   6. Chronic kidney disease stage IV. Baseline creatinine around 2. Currently at or better than baseline.  Code Status: full  Family Communication: patient  Disposition Plan: remain in patient   Consultants:  Cardiology/cardiothoracic surgery/infectious disease/radiology  Procedures: 2/6 Right shoulder jointARTHROSCOPY SHOULDER  Extensive debridement of the glenohumeral joint. Subacromial decompression.  Antibiotics:  ampicillin   Objective: BP 190/82 mmHg  Pulse 81  Temp(Src) 98.6 F (37 C) (Oral)  Resp 15  Ht 6\' 2"  (1.88 m)  Wt 119 kg (262 lb 5.6 oz)  BMI 33.67 kg/m2  SpO2 95%  Intake/Output Summary (Last 24 hours) at 02/05/15 1351 Last data filed at 02/05/15 0900  Gross per 24 hour  Intake   1090 ml  Output   2625 ml  Net  -1535 ml   Filed Weights   02/03/15 0841 02/04/15 0428 02/05/15 0515  Weight: 119.477 kg (263 lb 6.4 oz) 119.296 kg (263 lb) 119 kg (262 lb 5.6 oz)    Exam:   General:  Aaox3, NAD  Cardiovascular: RRR, ? murmur  Respiratory: decreased at bases, no wheezing/no rhonchi/ rales?  Abdomen: soft/NT/ND, positive bowel sounds  Musculoskeletal: pitting edema bilateral, right shoulder dressing intact, in sling.  Data Reviewed: Basic Metabolic Panel:  Recent Labs Lab 02/02/15 1035 02/03/15 0550 02/04/15 0545 02/05/15 0500  NA 142 139 141  --   K 3.6 3.5 3.6  --   CL 110 107 106  --   CO2 27 28 27   --  GLUCOSE 65* 126* 142*  --   BUN --   CREATININE 1.73* 1.78* 1.81*  --   CALCIUM 8.2* 7.8* 7.8*  --   MG  --   --  1.9 1.8   Liver Function Tests:  Recent Labs Lab 02/02/15 1035  AST 18  ALT 20  ALKPHOS 112  BILITOT 0.3  PROT 6.5  ALBUMIN 1.6*   No results for input(s): LIPASE, AMYLASE in the  last 168 hours. No results for input(s): AMMONIA in the last 168 hours. CBC:  Recent Labs Lab 02/02/15 1035 02/03/15 0550 02/04/15 0545 02/05/15 0500  WBC 6.3 5.8 4.6 4.7  NEUTROABS 4.2  --   --   --   HGB 9.0* 8.2* 8.1* 8.1*  HCT 27.2* 24.9* 25.2* 25.3*  MCV 86.1 85.0 87.5 85.8  PLT 316 285 292 318   Cardiac Enzymes:   No results for input(s): CKTOTAL, CKMB, CKMBINDEX, TROPONINI in the last 168 hours. BNP (last 3 results)  Recent Labs  01/17/15 0535 01/22/15 0347 02/02/15 1035  BNP 315.1* 218.9* 629.0*    ProBNP (last 3 results) No results for input(s): PROBNP in the last 8760 hours.  CBG:  Recent Labs Lab 02/04/15 1634 02/04/15 1817 02/04/15 2044 02/05/15 0922 02/05/15 1131  GLUCAP 157* 121* 183* 129* 136*    Recent Results (from the past 240 hour(s))  Culture, blood (routine x 2)     Status: None (Preliminary result)   Collection Time: 02/02/15  3:53 PM  Result Value Ref Range Status   Specimen Description BLOOD PICC LINE  Final   Special Requests BOTTLES DRAWN AEROBIC AND ANAEROBIC  Final   Culture   Final           BLOOD CULTURE RECEIVED NO GROWTH TO DATE CULTURE WILL BE HELD FOR 5 DAYS BEFORE ISSUING A FINAL NEGATIVE REPORT Performed at [facility]    Report Status PENDING  Incomplete  Culture, blood (routine x 2)     Status: None (Preliminary result)   Collection Time: 02/02/15  4:32 PM  Result Value Ref Range Status   Specimen Description BLOOD PICC LINE  Final   Special Requests BOTTLES DRAWN AEROBIC AND ANAEROBIC 10CC  Final   Culture   Final           BLOOD CULTURE RECEIVED NO GROWTH TO DATE CULTURE WILL BE HELD FOR 5 DAYS BEFORE ISSUING A FINAL NEGATIVE REPORT Performed at [facility]    Report Status PENDING  Incomplete  MRSA PCR Screening     Status: None   Collection Time: 02/02/15  5:14 PM  Result Value Ref Range Status   MRSA by PCR NEGATIVE NEGATIVE Final    Comment:        The GeneXpert MRSA Assay  (FDA approved for NASAL specimens only), is one component of a comprehensive MRSA colonization surveillance program. It is not intended to diagnose MRSA infection nor to guide or monitor treatment for MRSA infections.   Body fluid culture     Status: None (Preliminary result)   Collection Time: 02/04/15  5:45 PM  Result Value Ref Range Status   Specimen Description SYNOVIAL FLUID SHOULDER RIGHT  Final   Special Requests NONE  Final   Gram Stain   Final    FEW WBC PRESENT, PREDOMINANTLY PMN NO ORGANISMS SEEN Performed at [facility]    Culture NO GROWTH Performed at [facility]   Final   Report Status PENDING  Incomplete  Surgical pcr screen     Status: None   Collection Time: 02/05/15  5:06 AM  Result Value Ref Range Status   MRSA, PCR NEGATIVE NEGATIVE Final   Staphylococcus aureus NEGATIVE NEGATIVE Final    Comment:        The Xpert SA Assay (FDA approved for NASAL specimens in patients over 66 years of age), is one component of a comprehensive surveillance program.  Test performance has been validated by [facility] for patients greater than or equal to 30 year old. It is not intended to diagnose infection nor to guide or monitor treatment.      Studies: Mr Shoulder Right Wo Contrast  02/04/2015   CLINICAL DATA:  Septic arthritis of the right shoulder.  EXAM: MRI OF THE RIGHT SHOULDER WITHOUT CONTRAST  TECHNIQUE: Multiplanar, multisequence MR imaging of the shoulder was performed. No intravenous contrast was administered.  COMPARISON:  MRI dated 01/11/2015 and radiographs dated 01/09/2015  FINDINGS: Glenohumeral Joint: There is a large glenohumeral joint effusion with some debris in the joint. These have increased since the prior exam.  Bones: There is new edema in the head of the humerus around the superior aspect of the bicipital groove. There is no bone destruction but given the patient's known septic joint, the possibility of early osteomyelitis  must be considered.  Rotator cuff:  Normal.  Muscles: There is slight edema in and around all of the muscles of the rotator cuff. Some of this appears to be extravasated joint fluid. In the setting of infection, this could represent infectious myositis.  Biceps long head: Properly located and intact. Fluid distends the bicipital tendon sheath.  Acromioclavicular Joint: Slight arthritic changes of the AC joint, stable. New small joint effusion.  Labrum:  Normal.  IMPRESSION: 1. Increased glenohumeral joint effusion with extravasated joint fluid extending around the muscles of the rotator cuff with edema in the muscles. The findings are worrisome for persistent infection in the joint as well as adjacent infectious myositis. 2. New edema in the humeral head adjacent to the bicipital groove. In the setting of septic joint, the possibility of early osteomyelitis has to be considered. 3. New small effusion in the acromioclavicular joint. Given the patient's sepsis, the possibility of infection in the [facility] joint mass be considered.   Electronically Signed   By: Dwayne [name] M.D.   On: 02/04/2015 12:13    Scheduled Meds: . ampicillin (OMNIPEN) IV  2 g Intravenous Q4H  . docusate sodium  100 mg Oral BID  . feeding supplement (ENSURE COMPLETE)  237 mL Oral TID WC  . furosemide  80 mg Intravenous BID  . hydrALAZINE  50 mg Oral 3 times per day  . Influenza vac split quadrivalent PF  0.5 mL Intramuscular Tomorrow-1000  . insulin aspart  0-9 Units Subcutaneous TID WC  . metoprolol tartrate  100 mg Oral BID  . nitroGLYCERIN  0.5 inch Topical 4 times per day  . sodium chloride  3 mL Intravenous Q12H    Continuous Infusions: . sodium chloride 10 mL/hr (02/05/15 1016)  none     Dwayne [name]  [facility] Pager [phone]. If 7PM-7AM, please contact night-coverage at [email or web address], password [facility] 02/05/2015, 1:51 PM  LOS: 3 days

## 2015-02-05 NOTE — H&P (View-Only) (Signed)
Reason for Consult: Septic right shoulder Referring Physician: Internal medicine  Priest Lockridge is an 35 y.o. male.  HPI: Patient is a 35 year old gentleman type 2 diabetes uncontrolled who has had episodes of septic shock. Patient was seen over a month ago with effusion and pain of the right shoulder, WBC greater than 50,000. At this time patient has increased pain , aspiration was requested by interventional radiology and patient had purulent drainage.  Past Medical History  Diagnosis Date  . Hypertension   . Acute renal failure   . Bacteremia     MSSA   . Diabetes type 2, uncontrolled 01/09/2015  . 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 Essential hypertension, benign 01/18/2014  . Infective myositis of left thigh   . Noncompliance 01/18/2014  . Septic shock   . Staphylococcus aureus bacteremia with sepsis   . Tobacco abuse 01/15/2014  . Mitral regurgitation 01/13/2015    Severe by TEE  . MSSA (methicillin susceptible Staphylococcus aureus)   . Type II diabetes mellitus     not controlled  . Diabetic foot infection 01/15/2014    Past Surgical History  Procedure Laterality Date  . I&d extremity Bilateral 01/16/2014    Procedure:  DEBRIDEMENT of bilateral foot ulcers;  Surgeon: [name], MD;  Location: [place];  Service: Orthopedics;  Laterality: Bilateral;  . Tee without cardioversion N/A 01/13/2015    Procedure: TRANSESOPHAGEAL ECHOCARDIOGRAM (TEE);  Surgeon: [name], MD;  Location: [facility] ENDOSCOPY;  Service: Cardiovascular;  Laterality: N/A;  . Left and right heart catheterization with coronary angiogram N/A 01/24/2015    Procedure: LEFT AND RIGHT HEART CATHETERIZATION WITH CORONARY ANGIOGRAM;  Surgeon: [name], MD;  Location: [facility] CATH LAB;  Service: Cardiovascular;  Laterality: N/A;  . Multiple extractions with alveoloplasty N/A 01/25/2015    Procedure: Extraction of tooth #'s 1,2,3,4,5,6,8,9,11,12,13,14,15,16,17,18,19 28,31,32 with alveoloplasty and gross  debridement of remaining teeth.;  Surgeon: [name], DDS;  Location: [place];  Service: Oral Surgery;  Laterality: N/A;  . Cardiac catheterization  12/2014    History reviewed. No pertinent family history.  Social History:  reports that he quit smoking about 3 weeks ago. His smoking use included Cigars. He has never used smokeless tobacco. He reports that he drinks alcohol. He reports that he does not use illicit drugs.  Allergies: No Known Allergies  Medications: I have reviewed the patient's current medications.  Results for orders placed or performed during the hospital encounter of 02/02/15 (from the past 48 hour(s))  Glucose, capillary     Status: Abnormal   Collection Time: 02/02/15  8:59 PM  Result Value Ref Range   Glucose-Capillary 145 (H) 70 - 99 mg/dL   Comment 1 Orig Pt Id entered as [id number]   Basic metabolic panel     Status: Abnormal   Collection Time: 02/03/15  5:50 AM  Result Value Ref Range   Sodium 139 135 - 145 mmol/L   Potassium 3.5 3.5 - 5.1 mmol/L   Chloride 107 96 - 112 mmol/L   CO2 28 19 - 32 mmol/L   Glucose, Bld 126 (H) 70 - 99 mg/dL   BUN 13 6 - 23 mg/dL   Creatinine, Ser 1.78 (H) 0.50 - 1.35 mg/dL   Calcium 7.8 (L) 8.4 - 10.5 mg/dL   GFR calc non Af Amer 48 (L) >90 mL/min   GFR calc Af Amer 56 (L) >90 mL/min    Comment: (NOTE) The eGFR has been calculated using the CKD EPI equation. This  calculation has not been validated in all clinical situations. eGFR's persistently <90 mL/min signify possible Chronic Kidney Disease.    Anion gap 4 (L) 5 - 15  CBC     Status: Abnormal   Collection Time: 02/03/15  5:50 AM  Result Value Ref Range   WBC 5.8 4.0 - 10.5 K/uL   RBC 2.93 (L) 4.22 - 5.81 MIL/uL   Hemoglobin 8.2 (L) 13.0 - 17.0 g/dL   HCT 24.9 (L) 39.0 - 52.0 %   MCV 85.0 78.0 - 100.0 fL   MCH 28.0 26.0 - 34.0 pg   MCHC 32.9 30.0 - 36.0 g/dL   RDW 13.6 11.5 - 15.5 %   Platelets 285 150 - 400 K/uL  Glucose, capillary     Status: Abnormal    Collection Time: 02/03/15  7:37 AM  Result Value Ref Range   Glucose-Capillary 140 (H) 70 - 99 mg/dL  Glucose, capillary     Status: Abnormal   Collection Time: 02/03/15 11:37 AM  Result Value Ref Range   Glucose-Capillary 138 (H) 70 - 99 mg/dL  Glucose, capillary     Status: Abnormal   Collection Time: 02/03/15  4:30 PM  Result Value Ref Range   Glucose-Capillary 132 (H) 70 - 99 mg/dL  Glucose, capillary     Status: Abnormal   Collection Time: 02/03/15  9:15 PM  Result Value Ref Range   Glucose-Capillary 167 (H) 70 - 99 mg/dL  CBC     Status: Abnormal   Collection Time: 02/04/15  5:45 AM  Result Value Ref Range   WBC 4.6 4.0 - 10.5 K/uL   RBC 2.88 (L) 4.22 - 5.81 MIL/uL   Hemoglobin 8.1 (L) 13.0 - 17.0 g/dL   HCT 25.2 (L) 39.0 - 52.0 %   MCV 87.5 78.0 - 100.0 fL   MCH 28.1 26.0 - 34.0 pg   MCHC 32.1 30.0 - 36.0 g/dL   RDW 13.8 11.5 - 15.5 %   Platelets 292 150 - 400 K/uL  Basic metabolic panel     Status: Abnormal   Collection Time: 02/04/15  5:45 AM  Result Value Ref Range   Sodium 141 135 - 145 mmol/L   Potassium 3.6 3.5 - 5.1 mmol/L   Chloride 106 96 - 112 mmol/L   CO2 27 19 - 32 mmol/L   Glucose, Bld 142 (H) 70 - 99 mg/dL   BUN 16 6 - 23 mg/dL   Creatinine, Ser 1.81 (H) 0.50 - 1.35 mg/dL   Calcium 7.8 (L) 8.4 - 10.5 mg/dL   GFR calc non Af Amer 47 (L) >90 mL/min   GFR calc Af Amer 55 (L) >90 mL/min    Comment: (NOTE) The eGFR has been calculated using the CKD EPI equation. This calculation has not been validated in all clinical situations. eGFR's persistently <90 mL/min signify possible Chronic Kidney Disease.    Anion gap 8 5 - 15  Magnesium     Status: None   Collection Time: 02/04/15  5:45 AM  Result Value Ref Range   Magnesium 1.9 1.5 - 2.5 mg/dL  TSH     Status: None   Collection Time: 02/04/15  5:45 AM  Result Value Ref Range   TSH 2.046 0.350 - 4.500 uIU/mL  Glucose, capillary     Status: Abnormal   Collection Time: 02/04/15  7:54 AM  Result  Value Ref Range   Glucose-Capillary 128 (H) 70 - 99 mg/dL  Glucose, capillary     Status: Abnormal  Collection Time: 02/04/15 12:07 PM  Result Value Ref Range   Glucose-Capillary 184 (H) 70 - 99 mg/dL  Glucose, capillary     Status: Abnormal   Collection Time: 02/04/15  4:34 PM  Result Value Ref Range   Glucose-Capillary 157 (H) 70 - 99 mg/dL  Glucose, capillary     Status: Abnormal   Collection Time: 02/04/15  6:17 PM  Result Value Ref Range   Glucose-Capillary 121 (H) 70 - 99 mg/dL    Mr Shoulder Right Wo Contrast  02/04/2015   CLINICAL DATA:  Septic arthritis of the right shoulder.  EXAM: MRI OF THE RIGHT SHOULDER WITHOUT CONTRAST  TECHNIQUE: Multiplanar, multisequence MR imaging of the shoulder was performed. No intravenous contrast was administered.  COMPARISON:  MRI dated 01/11/2015 and radiographs dated 01/09/2015  FINDINGS: Glenohumeral Joint: There is a large glenohumeral joint effusion with some debris in the joint. These have increased since the prior exam.  Bones: There is new edema in the head of the humerus around the superior aspect of the bicipital groove. There is no bone destruction but given the patient's known septic joint, the possibility of early osteomyelitis must be considered.  Rotator cuff:  Normal.  Muscles: There is slight edema in and around all of the muscles of the rotator cuff. Some of this appears to be extravasated joint fluid. In the setting of infection, this could represent infectious myositis.  Biceps long head: Properly located and intact. Fluid distends the bicipital tendon sheath.  Acromioclavicular Joint: Slight arthritic changes of the AC joint, stable. New small joint effusion.  Labrum:  Normal.  IMPRESSION: 1. Increased glenohumeral joint effusion with extravasated joint fluid extending around the muscles of the rotator cuff with edema in the muscles. The findings are worrisome for persistent infection in the joint as well as adjacent infectious myositis.  2. New edema in the humeral head adjacent to the bicipital groove. In the setting of septic joint, the possibility of early osteomyelitis has to be considered. 3. New small effusion in the acromioclavicular joint. Given the patient's sepsis, the possibility of infection in the [facility] joint mass be considered.   Electronically Signed   By: Rozetta Nunnery M.D.   On: 02/04/2015 12:13    Review of Systems  All other systems reviewed and are negative.  Blood pressure 149/81, pulse 77, temperature 99.3 F (37.4 C), temperature source Oral, resp. rate 18, height 6' 2"  (1.88 m), weight 119.296 kg (263 lb), SpO2 100 %. Physical Exam On examination patient has pain to palpation around the shoulder. The before meals joint is minimally tender to palpation. Passive range of motion is painful. Review of the MRI scan is suggestive of early osteomyelitis with effusion of the joint. Assessment/Plan: Assessment: Septic right shoulder with early osteomyelitis.  Plan: I will plan for arthroscopic debridement of the right shoulder tomorrow morning. Risks and benefits of surgery were discussed with the patient including persistent infection neurovascular injury risks of surgery need for additional surgery. Patient states he understands and wishes to proceed at this time.  Ruberta Holck V 02/04/2015, 6:58 PM

## 2015-02-05 NOTE — Op Note (Signed)
02/02/2015 - 02/05/2015  9:01 AM  PATIENT:  Dwayne HickmanBenjamin [name]    PRE-OPERATIVE DIAGNOSIS:  SEPTIC RIGHT SHOULDER  POST-OPERATIVE DIAGNOSIS:  Same  PROCEDURE:  ARTHROSCOPY SHOULDER  Extensive debridement of the glenohumeral joint. Subacromial decompression.  SURGEON:  Nadara MustardUDA,Keagan Anthis V, MD  PHYSICIAN ASSISTANT:None ANESTHESIA:   General  PREOPERATIVE INDICATIONS:  Dwayne [name] is a  35 y.o. male with a diagnosis of SEPTIC RIGHT SHOULDER who failed conservative measures and elected for surgical management.    The risks benefits and alternatives were discussed with the patient preoperatively including but not limited to the risks of infection, bleeding, nerve injury, cardiopulmonary complications, the need for revision surgery, among others, and the patient was willing to proceed.  OPERATIVE IMPLANTS: None  OPERATIVE FINDINGS: Purulence within the glenohumeral joint  OPERATIVE PROCEDURE: Patient was brought to the operating room and underwent a general anesthetic after an interscalene block. After adequate localization anesthesia were obtained patient was placed in the beachchair position and the right upper extremity was prepped using DuraPrep draped into a sterile field. A timeout was called. The scope was inserted into the glenohumeral joint from the posterior portal and anterior portal was established with outside in technique with a 18-gauge spinal. Upon inserting the cannulas within the joint there was purulent drainage it was not white but it was no D clear drainage. Cultures were obtained due to the patient's extensive use of IV antibiotics. The shaver was inserted through the anterior portal and patient underwent extensive debridement with synovitis involving the entire rotator cuff this was debrided there were loose bodies there was a SLAP lesion and there was tearing of the biceps tendon these were all debrided. The articular cartilage was intact. After extensive debridement within  the shoulder joint instance we moved the scope was then inserted from the posterior portal and the subacromial space and a new lateral portal was established. Patient had extensive synovitis within the subacromial space as well. Patient underwent subacromial decompression and further debridement. The advancements remove the portals were closed using 2-0 nylon sterile dressing was applied patient was extubated taken to the PACU in stable condition.

## 2015-02-05 NOTE — Anesthesia Procedure Notes (Signed)
Anesthesia Regional Block:  Interscalene brachial plexus block  Pre-Anesthetic Checklist: ,, timeout performed, Correct Patient, Correct Site, Correct Laterality, Correct Procedure, Correct Position, site marked, Risks and benefits discussed,  Surgical consent,  Pre-op evaluation,  At surgeon's request and post-op pain management  Laterality: Right and Upper  Prep: chloraprep       Needles:  Injection technique: Single-shot  Needle Type: Echogenic Needle     Needle Length: 5cm 5 cm Needle Gauge: 21 and 21 G    Additional Needles:  Procedures: ultrasound guided (picture in chart) Interscalene brachial plexus block Narrative:  Start time: 02/05/2015 7:29 AM End time: 02/05/2015 7:33 AM Injection made incrementally with aspirations every 5 mL.  Performed by: Personally  Anesthesiologist: Yaelis Scharfenberg A

## 2015-02-06 DIAGNOSIS — R600 Localized edema: Secondary | ICD-10-CM | POA: Insufficient documentation

## 2015-02-06 DIAGNOSIS — I669 Occlusion and stenosis of unspecified cerebral artery: Secondary | ICD-10-CM

## 2015-02-06 DIAGNOSIS — M60811 Other myositis, right shoulder: Secondary | ICD-10-CM

## 2015-02-06 LAB — BASIC METABOLIC PANEL
ANION GAP: 3 — AB (ref 5–15)
BUN: 15 mg/dL (ref 6–23)
CO2: 30 mmol/L (ref 19–32)
CREATININE: 1.67 mg/dL — AB (ref 0.50–1.35)
Calcium: 7.9 mg/dL — ABNORMAL LOW (ref 8.4–10.5)
Chloride: 104 mmol/L (ref 96–112)
GFR calc Af Amer: 60 mL/min — ABNORMAL LOW (ref >90)
GFR calc non Af Amer: 52 mL/min — ABNORMAL LOW (ref >90)
Glucose, Bld: 159 mg/dL — ABNORMAL HIGH (ref 70–99)
Potassium: 3.7 mmol/L (ref 3.5–5.1)
Sodium: 137 mmol/L (ref 135–145)

## 2015-02-06 LAB — CBC
HEMATOCRIT: 25.3 % — AB (ref 39.0–52.0)
Hemoglobin: 8.2 g/dL — ABNORMAL LOW (ref 13.0–17.0)
MCH: 27.9 pg (ref 26.0–34.0)
MCHC: 32.4 g/dL (ref 30.0–36.0)
MCV: 86.1 fL (ref 78.0–100.0)
Platelets: 330 10*3/uL (ref 150–400)
RBC: 2.94 MIL/uL — ABNORMAL LOW (ref 4.22–5.81)
RDW: 14.2 % (ref 11.5–15.5)
WBC: 6.3 10*3/uL (ref 4.0–10.5)

## 2015-02-06 LAB — GLUCOSE, CAPILLARY
GLUCOSE-CAPILLARY: 156 mg/dL — AB (ref 70–99)
Glucose-Capillary: 249 mg/dL — ABNORMAL HIGH (ref 70–99)
Glucose-Capillary: 165 mg/dL — ABNORMAL HIGH (ref 70–99)
Glucose-Capillary: 197 mg/dL — ABNORMAL HIGH (ref 70–99)

## 2015-02-06 LAB — MAGNESIUM: Magnesium: 1.8 mg/dL (ref 1.5–2.5)

## 2015-02-06 MED ORDER — ISOSORBIDE MONONITRATE ER 60 MG PO TB24
60.0000 mg | ORAL_TABLET | Freq: Every day | ORAL | Status: DC
  Administered 2015-02-07: 60 mg via ORAL
  Filled 2015-02-06: qty 1

## 2015-02-06 MED ORDER — HYDRALAZINE HCL 50 MG PO TABS
100.0000 mg | ORAL_TABLET | Freq: Three times a day (TID) | ORAL | Status: DC
  Administered 2015-02-06 – 2015-02-07 (×2): 100 mg via ORAL
  Filled 2015-02-06 (×2): qty 2

## 2015-02-06 MED ORDER — CARVEDILOL 25 MG PO TABS
25.0000 mg | ORAL_TABLET | Freq: Two times a day (BID) | ORAL | Status: DC
  Administered 2015-02-06 – 2015-02-08 (×4): 25 mg via ORAL
  Filled 2015-02-06 (×4): qty 1

## 2015-02-06 MED ORDER — ISOSORBIDE MONONITRATE ER 30 MG PO TB24
30.0000 mg | ORAL_TABLET | Freq: Every day | ORAL | Status: DC
  Administered 2015-02-06: 30 mg via ORAL
  Filled 2015-02-06: qty 1

## 2015-02-06 MED ORDER — ISOSORBIDE MONONITRATE ER 30 MG PO TB24
30.0000 mg | ORAL_TABLET | Freq: Once | ORAL | Status: AC
  Administered 2015-02-06: 30 mg via ORAL
  Filled 2015-02-06: qty 1

## 2015-02-06 NOTE — Progress Notes (Addendum)
PROGRESS NOTE  Rob HickmanBenjamin Senske WUJ:[id number] DOB: 01/31/1980 DOA: 02/02/2015 PCP: No PCP Per Patient  HPI/Recap of past 24 hours:  s/p right shoulder washout. Post op day 1.  Scrotal us unremarkable, continued scrotal edema. bp not well controlled. Wife at bedside. Does not want to engage care actively, states his wife is a CNA , normally take cares of his medical needs and information.  Assessment/Plan: Principal Problem:   Acute diastolic CHF (congestive heart failure), NYHA class 3 Active Problems:   Essential hypertension, benign   Diabetes type 2, uncontrolled   Acute renal failure   Mitral regurgitation   Endocarditis   PVD (peripheral vascular disease)   Hypoalbuminemia   Anasarca   Arthritis, septic, shoulder   Acute pericarditis   Right shoulder pain 1. Acute on chronic diastolic dysfunction EF 60%. 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 stepdown unit, IV Lasix 80 mg twice a day one dose now, nitro paste, continue beta blocker, R [employer] board. Cardura 6 surgery consulted by cardiology. Blood cultures drawn to make sure no ongoing bacteremia. Continue IV ampicillin dosed by pharmacy for now. Infectious disease consulted. Has left arm PICC line. Supportive care with oxygen and nebulizer treatments.  2. Right shoulder septic arthritis. Seen by orthopedics last admission. IV antibiotics. No surgical intervention planned by orthopedics during last admission.   Mri shoulder on 2/5 concerning for septic arthritis/osteo? [facility] Dr. Lajoyce Cornersuda called and recommended imaging guided joint aspiration, he will see patient later today or tomorrow. Discussed with [place] [place] radiology MD, agree with proceed right shoulder joint aspiration today. ([id number]) 2/6 patient was brought to the OR instead to have Right shoulder ARTHROSCOPY SHOULDER  Extensive debridement of the glenohumeral joint. Subacromial decompression. Ortho input appreciated.  3. Dental caries.  Multiple teeth removed one week ago prior to this admission. Still requiring prn pain meds.  4. DM type II. Last A1c was above 8. A new home dose 70-30, add sliding scale.    5. Essential hypertension.  Uncontrolled. meds adjusted: change lopressor to coreg, added imdur, continue prn hydralazine.    6. Chronic kidney disease stage IV. Baseline creatinine around 2. Currently at or better than baseline.  Code Status: full  Family Communication: patient  Disposition Plan: remain in patient   Consultants:  Cardiology/cardiothoracic surgery/infectious disease/radiology  Procedures: 2/6 Right shoulder jointARTHROSCOPY SHOULDER  Extensive debridement of the glenohumeral joint. Subacromial decompression.  Antibiotics:  ampicillin   Objective: BP 206/121 mmHg  Pulse 83  Temp(Src) 98.7 F (37.1 C) (Oral)  Resp 16  Ht 6\' 2"  (1.88 m)  Wt 119.296 kg (263 lb)  BMI 33.75 kg/m2  SpO2 100%  Intake/Output Summary (Last 24 hours) at 02/06/15 1500 Last data filed at 02/06/15 1302  Gross per 24 hour  Intake    480 ml  Output   1325 ml  Net   -845 ml   Filed Weights   02/04/15 0428 02/05/15 0515 02/06/15 0530  Weight: 119.296 kg (263 lb) 119 kg (262 lb 5.6 oz) 119.296 kg (263 lb)    Exam:   General:  Aaox3, NAD  Cardiovascular: RRR, ? murmur  Respiratory: decreased at bases, no wheezing/no rhonchi/ rales?  Abdomen: soft/NT/ND, positive bowel sounds  Musculoskeletal: pitting edema bilateral, right shoulder dressing intact, in sling.  Data Reviewed: Basic Metabolic Panel:  Recent Labs Lab 02/02/15 1035 02/03/15 0550 02/04/15 0545 02/05/15 0500 02/06/15 0500  NA 142 139 141  --  137  K 3.6 3.5 3.6  --  3.7  CL 110 107 106  --  104  CO2 --  30  GLUCOSE 65* 126* 142*  --  159*  BUN --  15  CREATININE 1.73* 1.78* 1.81*  --  1.67*  CALCIUM 8.2* 7.8* 7.8*  --  7.9*  MG  --   --  1.9 1.8 1.8   Liver Function Tests:  Recent Labs Lab  02/02/15 1035  AST 18  ALT 20  ALKPHOS 112  BILITOT 0.3  PROT 6.5  ALBUMIN 1.6*   No results for input(s): LIPASE, AMYLASE in the last 168 hours. No results for input(s): AMMONIA in the last 168 hours. CBC:  Recent Labs Lab 02/02/15 1035 02/03/15 0550 02/04/15 0545 02/05/15 0500 02/06/15 0500  WBC 6.3 5.8 4.6 4.7 6.3  NEUTROABS 4.2  --   --   --   --   HGB 9.0* 8.2* 8.1* 8.1* 8.2*  HCT 27.2* 24.9* 25.2* 25.3* 25.3*  MCV 86.1 85.0 87.5 85.8 86.1  PLT 316 285 292 318 330   Cardiac Enzymes:   No results for input(s): CKTOTAL, CKMB, CKMBINDEX, TROPONINI in the last 168 hours. BNP (last 3 results)  Recent Labs  01/17/15 0535 01/22/15 0347 02/02/15 1035  BNP 315.1* 218.9* 629.0*    ProBNP (last 3 results) No results for input(s): PROBNP in the last 8760 hours.  CBG:  Recent Labs Lab 02/05/15 1131 02/05/15 1625 02/05/15 2031 02/06/15 0727 02/06/15 1115  GLUCAP 136* 120* 262* 156* 249*    Recent Results (from the past 240 hour(s))  Culture, blood (routine x 2)     Status: None (Preliminary result)   Collection Time: 02/02/15  3:53 PM  Result Value Ref Range Status   Specimen Description BLOOD PICC LINE  Final   Special Requests BOTTLES DRAWN AEROBIC AND ANAEROBIC  Final   Culture   Final           BLOOD CULTURE RECEIVED NO GROWTH TO DATE CULTURE WILL BE HELD FOR 5 DAYS BEFORE ISSUING A FINAL NEGATIVE REPORT Performed at [facility]    Report Status PENDING  Incomplete  Culture, blood (routine x 2)     Status: None (Preliminary result)   Collection Time: 02/02/15  4:32 PM  Result Value Ref Range Status   Specimen Description BLOOD PICC LINE  Final   Special Requests BOTTLES DRAWN AEROBIC AND ANAEROBIC 10CC  Final   Culture   Final           BLOOD CULTURE RECEIVED NO GROWTH TO DATE CULTURE WILL BE HELD FOR 5 DAYS BEFORE ISSUING A FINAL NEGATIVE REPORT Performed at [facility]    Report Status PENDING  Incomplete  MRSA PCR  Screening     Status: None   Collection Time: 02/02/15  5:14 PM  Result Value Ref Range Status   MRSA by PCR NEGATIVE NEGATIVE Final    Comment:        The GeneXpert MRSA Assay (FDA approved for NASAL specimens only), is one component of a comprehensive MRSA colonization surveillance program. It is not intended to diagnose MRSA infection nor to guide or monitor treatment for MRSA infections.   Body fluid culture     Status: None (Preliminary result)   Collection Time: 02/04/15  5:45 PM  Result Value Ref Range Status   Specimen Description SYNOVIAL FLUID SHOULDER RIGHT  Final   Special Requests NONE  Final   Gram Stain   Final  FEW WBC PRESENT, PREDOMINANTLY PMN NO ORGANISMS SEEN Performed at [facility]    Culture   Final    NO GROWTH 1 DAY Performed at [facility]    Report Status PENDING  Incomplete  Surgical pcr screen     Status: None   Collection Time: 02/05/15  5:06 AM  Result Value Ref Range Status   MRSA, PCR NEGATIVE NEGATIVE Final   Staphylococcus aureus NEGATIVE NEGATIVE Final    Comment:        The Xpert SA Assay (FDA approved for NASAL specimens in patients over 53 years of age), is one component of a comprehensive surveillance program.  Test performance has been validated by [facility] for patients greater than or equal to 44 year old. It is not intended to diagnose infection nor to guide or monitor treatment.      Studies: US Scrotum  02/05/2015   CLINICAL DATA:  Right testicular pain and swelling for 1 day  EXAM: SCROTAL ULTRASOUND  DOPPLER ULTRASOUND OF THE TESTICLES  TECHNIQUE: Complete ultrasound examination of the testicles, epididymis, and other scrotal structures was performed. Color and spectral Doppler ultrasound were also utilized to evaluate blood flow to the testicles.  COMPARISON:  None.  FINDINGS: Right testicle  Measurements: 4.4 x 2.9 x 2.4 cm. No mass or microlithiasis visualized.  Left testicle  Measurements: 4.5  x 2.6 x 2.6 cm. No mass or microlithiasis visualized.  Subjectively minimally prominent internal color flow within both testes.  Right epididymis:  Normal in size and appearance.  Left epididymis:  Normal in size and appearance.  Hydrocele:  None visualized.  Varicocele:  None visualized.  Pulsed Doppler interrogation of both testes demonstrates normal low resistance arterial and venous waveforms bilaterally.  IMPRESSION: No sonographic evidence for intratesticular mass or testicular torsion.  Subjectively minimally prominent internal color flow within both testes which may be a normal variant although orchitis could appear similar.   Electronically Signed   By: Christiana Pellant M.D.   On: 02/05/2015 21:56   [place] Art/ven Flow Abd Pelv Doppler  02/05/2015   CLINICAL DATA:  Right testicular pain and swelling for 1 day  EXAM: SCROTAL ULTRASOUND  DOPPLER ULTRASOUND OF THE TESTICLES  TECHNIQUE: Complete ultrasound examination of the testicles, epididymis, and other scrotal structures was performed. Color and spectral Doppler ultrasound were also utilized to evaluate blood flow to the testicles.  COMPARISON:  None.  FINDINGS: Right testicle  Measurements: 4.4 x 2.9 x 2.4 cm. No mass or microlithiasis visualized.  Left testicle  Measurements: 4.5 x 2.6 x 2.6 cm. No mass or microlithiasis visualized.  Subjectively minimally prominent internal color flow within both testes.  Right epididymis:  Normal in size and appearance.  Left epididymis:  Normal in size and appearance.  Hydrocele:  None visualized.  Varicocele:  None visualized.  Pulsed Doppler interrogation of both testes demonstrates normal low resistance arterial and venous waveforms bilaterally.  IMPRESSION: No sonographic evidence for intratesticular mass or testicular torsion.  Subjectively minimally prominent internal color flow within both testes which may be a normal variant although orchitis could appear similar.   Electronically Signed   By: Christiana Pellant  M.D.   On: 02/05/2015 21:56    Scheduled Meds: . ampicillin (OMNIPEN) IV  2 g Intravenous Q4H  . carvedilol  25 mg Oral BID WC  . docusate sodium  100 mg Oral BID  . feeding supplement (ENSURE COMPLETE)  237 mL Oral TID WC  . furosemide  80  mg Intravenous BID  . hydrALAZINE  50 mg Oral 3 times per day  . Influenza vac split quadrivalent PF  0.5 mL Intramuscular Tomorrow-1000  . insulin aspart  0-9 Units Subcutaneous TID WC  . isosorbide mononitrate  30 mg Oral Daily  . sodium chloride  3 mL Intravenous Q12H    Continuous Infusions: . sodium chloride 10 mL/hr (02/05/15 1016)  none     Sherby Moncayo  [facility] Pager [phone]. If 7PM-7AM, please contact night-coverage at [email or web address], password [email or web address] 02/06/2015, 3:00 PM  LOS: 4 days      7pm, RN paged reported elevated sbp and patient c/o facial numbness. bp meds adjusted, mri brain ordered.

## 2015-02-06 NOTE — Progress Notes (Signed)
Patient ID: Antionne Enrique, male   DOB: May 02, 1980, 35 y.o.   MRN: [id number]    Patient Name: Dwayne [name] Date of Encounter: 02/06/2015     Principal Problem:   Acute diastolic CHF (congestive heart failure), NYHA class 3 Active Problems:   Essential hypertension, benign   Diabetes type 2, uncontrolled   Acute renal failure   Mitral regurgitation   Endocarditis   PVD (peripheral vascular disease)   Hypoalbuminemia   Anasarca   Arthritis, septic, shoulder   Acute pericarditis   Right shoulder pain    SUBJECTIVE  C/o scrotal swelling. Note testicular ultrasound yesterday. Denies chest pain or sob.  CURRENT MEDS . ampicillin (OMNIPEN) IV  2 g Intravenous Q4H  . docusate sodium  100 mg Oral BID  . feeding supplement (ENSURE COMPLETE)  237 mL Oral TID WC  . furosemide  80 mg Intravenous BID  . hydrALAZINE  50 mg Oral 3 times per day  . Influenza vac split quadrivalent PF  0.5 mL Intramuscular Tomorrow-1000  . insulin aspart  0-9 Units Subcutaneous TID WC  . metoprolol tartrate  100 mg Oral BID  . nitroGLYCERIN  0.5 inch Topical 4 times per day  . sodium chloride  3 mL Intravenous Q12H    OBJECTIVE  Filed Vitals:   02/06/15 0030 02/06/15 0530 02/06/15 0639 02/06/15 0940  BP: 156/72 207/88 190/78 155/83  Pulse:  77 81 83  Temp:  98.8 F (37.1 C)    TempSrc:  Oral    Resp: 16 18    Height:      Weight:  263 lb (119.296 kg)    SpO2:  92%      Intake/Output Summary (Last 24 hours) at 02/06/15 1035 Last data filed at 02/06/15 0825  Gross per 24 hour  Intake    240 ml  Output   1450 ml  Net  -1210 ml   Filed Weights   02/04/15 0428 02/05/15 0515 02/06/15 0530  Weight: 263 lb (119.296 kg) 262 lb 5.6 oz (119 kg) 263 lb (119.296 kg)    PHYSICAL EXAM  General: Pleasant, NAD. Neuro: Alert and oriented X 3. Moves all extremities spontaneously. Psych: Normal affect. HEENT:  Normal  Neck: Supple without bruits or JVD. Lungs:  Resp regular and unlabored,  CTA. Heart: RRR no s3, s4, or murmurs. Abdomen: Soft, non-tender, non-distended, BS + x 4.  Extremities: No clubbing, cyanosis or edema. DP/PT/Radials 2+ and equal bilaterally.  Accessory Clinical Findings  CBC  Recent Labs  02/05/15 0500 02/06/15 0500  WBC 4.7 6.3  HGB 8.1* 8.2*  HCT 25.3* 25.3*  MCV 85.8 86.1  PLT 318 330   Basic Metabolic Panel  Recent Labs  02/04/15 0545 02/05/15 0500 02/06/15 0500  NA 141  --  137  K 3.6  --  3.7  CL 106  --  104  CO2 27  --  30  GLUCOSE 142*  --  159*  BUN 16  --  15  CREATININE 1.81*  --  1.67*  CALCIUM 7.8*  --  7.9*  MG 1.9 1.8 1.8   Liver Function Tests No results for input(s): AST, ALT, ALKPHOS, BILITOT, PROT, ALBUMIN in the last 72 hours. No results for input(s): LIPASE, AMYLASE in the last 72 hours. Cardiac Enzymes No results for input(s): CKTOTAL, CKMB, CKMBINDEX, TROPONINI in the last 72 hours. BNP Invalid input(s): POCBNP D-Dimer No results for input(s): DDIMER in the last 72 hours. Hemoglobin A1C No results for input(s): HGBA1C in the  last 72 hours. Fasting Lipid Panel No results for input(s): CHOL, HDL, LDLCALC, TRIG, CHOLHDL, LDLDIRECT in the last 72 hours. Thyroid Function Tests  Recent Labs  02/04/15 0545  TSH 2.046    TELE NSR    ASSESSMENT AND PLAN  1. Endocarditis - stable on anti-biotic therapy 2. Mitral regurgitation due to #1 3. Septic shoulder, s/p I&D 4. Bacterial pericarditis Rec: the patient is stable and has minimal shoulder pain. Etiology of scrotal swelling is unclear. It could be due to anasarca. I'll defer decision for urologic evaluation to his primary team/ID.  Ojas Coone,M.D.  02/06/2015 10:35 AM

## 2015-02-06 NOTE — Plan of Care (Signed)
Problem: Phase II Progression Outcomes Goal: Dyspnea controlled with activity Outcome: Adequate for Discharge Pt walked with PT in hallway around circle at nurse's station. Minimal SOB with exertion.

## 2015-02-06 NOTE — Progress Notes (Signed)
Patient ID: Dwayne HickmanBenjamin Bacigalupi, male   DOB: 06/25/1980, 35 y.o.   MRN: [id number] Postoperative day 1 right shoulder arthroscopic debridement and subacromial decompression for septic shoulder. Patient's dressing will be changed today. He will begin range of motion exercises today with the shoulder. This was discussed and demonstrated to him. Orders written for occupational therapy. Dilaudid and Vicodin orders  discontinued and patient may have Percocet for pain.

## 2015-02-06 NOTE — Evaluation (Signed)
Physical Therapy Evaluation Patient Details Name: Dwayne [name] MRN: [id number] DOB: [birth year] Today's Date: 02/06/2015   History of Present Illness  Dwayne [name]  is a 35 y.o. 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 He was planned to undergo open heart surgery for valve replacement in 4-6 weeks. admitted with chest pain, anasarca, scrotal edema, and R shoulder septic arthritis; Underwent R shoulder I& D and subacromial decompression on 2/6  Past Medical History  Diagnosis Date  . Hypertension   . Acute renal failure   . Bacteremia     MSSA   . Diabetes type 2, uncontrolled 01/09/2015  . 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 Essential hypertension, benign 01/18/2014  . Infective myositis of left thigh   . Noncompliance 01/18/2014  . Septic shock   . Staphylococcus aureus bacteremia with sepsis   . Tobacco abuse 01/15/2014  . Mitral regurgitation 01/13/2015    Severe by TEE  . MSSA (methicillin susceptible Staphylococcus aureus)   . Type II diabetes mellitus     not controlled  . Diabetic foot infection 01/15/2014   Past Surgical History  Procedure Laterality Date  . I&d extremity Bilateral 01/16/2014    Procedure:  DEBRIDEMENT of bilateral foot ulcers;  Surgeon: Kathryne Hitch, MD;  Location: [facility] OR;  Service: Orthopedics;  Laterality: Bilateral;  . Tee without cardioversion N/A 01/13/2015    Procedure: TRANSESOPHAGEAL ECHOCARDIOGRAM (TEE);  Surgeon: Thurmon Fair, MD;  Location: [facility] ENDOSCOPY;  Service: Cardiovascular;  Laterality: N/A;  . Left and right heart catheterization with coronary angiogram N/A 01/24/2015    Procedure:  LEFT AND RIGHT HEART CATHETERIZATION WITH CORONARY ANGIOGRAM;  Surgeon: Peter M Swaziland, MD;  Location: [facility] CATH LAB;  Service: Cardiovascular;  Laterality: N/A;  . Multiple extractions with alveoloplasty N/A 01/25/2015    Procedure: Extraction of tooth #'s 1,2,3,4,5,6,8,9,11,12,13,14,15,16,17,18,19 28,31,32 with alveoloplasty and gross debridement of remaining teeth.;  Surgeon: Charlynne Pander, DDS;  Location: [facility] OR;  Service: Oral Surgery;  Laterality: N/A;  . Cardiac catheterization  12/2014     Clinical Impression  Pt admitted with above diagnosis. Pt currently with functional limitations due to the deficits listed below (see PT Problem List).  Pt will benefit from skilled PT to increase their independence and safety with mobility to allow discharge to the venue listed below.       Follow Up Recommendations Outpatient PT (or Outpatient OT)  The potential need for Outpatient PT can be addressed at Ortho follow-up appointments.     Equipment Recommendations  None recommended by PT    Recommendations for Other Services OT consult     Precautions / Restrictions Precautions Required Braces or Orthoses: Sling Restrictions Weight Bearing Restrictions: No      Mobility  Bed Mobility               General bed mobility comments: EOB upon arrival  Transfers Overall transfer level: Needs assistance Equipment used: 1 person hand held assist Transfers: Sit to/from Stand Sit to Stand: Min assist         General transfer comment: Minsteady assist with suport given at pt's L elbow; overall good rise  Ambulation/Gait Ambulation/Gait assistance: Min guard (without physical contact)  Ambulation Distance (Feet): 150 Feet Assistive device: None Gait Pattern/deviations: Wide base of support Gait velocity: quite slow Gait velocity interpretation: Below normal speed for age/gender General Gait Details: discomfort from scrotal edema slower gait and contributing to wide step width; was  able to use L hand (in shorts pocket) to support scrotum during walk; no gross loss of balance noted  Stairs            Wheelchair Mobility    Modified Rankin (Stroke Patients Only)       Balance Overall balance assessment: No apparent balance deficits (not formally assessed)                                           Pertinent Vitals/Pain Pain Assessment: Faces Faces Pain Scale: Hurts whole lot Pain Location: R shoulder with any attempts at movement Pain Descriptors / Indicators: Discomfort;Grimacing;Aching Pain Intervention(s): Limited activity within patient's tolerance;Monitored during session;Repositioned;Premedicated before session    Home Living Family/patient expects to be discharged to:: Private residence Living Arrangements: Spouse/significant other;Parent Available Help at Discharge: Family;Available PRN/intermittently Type of Home: House Home Access: Stairs to enter Entrance Stairs-Rails: Doctor, general practiceight;Left Entrance Stairs-Number of Steps: 3 Home Layout: One level Home Equipment: None      Prior Function Level of Independence: Independent               Hand Dominance        Extremity/Trunk Assessment   Upper Extremity Assessment: Defer to OT evaluation (Pain significantly limiting R shoulder ROM)           Lower Extremity Assessment:  (Strength grossly WFL; tending to keep bil hips in abducted position due to scrotal edema)         Communication   Communication: No difficulties  Cognition Arousal/Alertness: Awake/alert Behavior During Therapy: WFL for tasks assessed/performed;Flat affect (Needing lots of encouragement) Overall Cognitive Status: Within Functional Limits for tasks assessed                      General Comments      Exercises Other Exercises Other Exercises: Gross finger flexion and extension R hand Other Exercises: R elbow flex/extend Other Exercises: Declined any shoudler therex; demonstrated  options (finger crawl and pendulum), but ultimately he declined      Assessment/Plan    PT Assessment Patient needs continued PT services  PT Diagnosis Acute pain   PT Problem List Decreased strength;Decreased range of motion;Decreased activity tolerance;Decreased mobility;Decreased knowledge of precautions;Pain  PT Treatment Interventions DME instruction;Gait training;Stair training;Functional mobility training;Therapeutic activities;Therapeutic exercise;Patient/family education   PT Goals (Current goals can be found in the Care Plan section) Acute Rehab PT Goals Patient Stated Goal: did not state PT Goal Formulation: With patient Time For Goal Achievement: 02/20/15 Potential to Achieve Goals: Good    Frequency Min 3X/week (likely one more session, and will meet PT goals)   Barriers to discharge        Co-evaluation               End of Session Equipment Utilized During Treatment:  (sling) Activity Tolerance: Patient limited by pain Patient left: in chair;with call bell/phone within reach Nurse Communication: Mobility status         Time: 1129-1150 PT Time Calculation (min) (ACUTE ONLY): 21 min   Charges:   PT Evaluation $Initial PT Evaluation Tier I: 1 Procedure  PT G Codes:        Van Clines Hamff 02/06/2015, 1:03 PM  Van Clines, [place]  Acute Rehabilitation Services Pager [phone] Office [phone]

## 2015-02-06 NOTE — Progress Notes (Signed)
[facility]    Date of Admission:  02/02/2015   Total days of antibiotics 5(from this hospitalization        Day 5 ampicillin           ID: Dwayne [name] is a 35 y.o. 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 He was readmitted for acute diastolic failure but worsening shoulder pain. Imaging showed worsening osteomyelitis w/septic arthritis. He is  Postoperative day 1 s/p right shoulder arthroscopic debridement and subacromial decompression for septic shoulder.  Principal Problem:   Acute diastolic CHF (congestive heart failure), NYHA class 3 Active Problems:   Essential hypertension, benign   Diabetes type 2, uncontrolled   Acute renal failure   Mitral regurgitation   Endocarditis   PVD (peripheral vascular disease)   Hypoalbuminemia   Anasarca   Arthritis, septic, shoulder   Acute pericarditis   Right shoulder pain    Subjective: Afebrile, still having shoulder pain, and lower extremity swelling/tightness  Medications:  . ampicillin (OMNIPEN) IV  2 g Intravenous Q4H  . docusate sodium  100 mg Oral BID  . feeding supplement (ENSURE COMPLETE)  237 mL Oral TID WC  . furosemide  80 mg Intravenous BID  . hydrALAZINE  50 mg Oral 3 times per day  . Influenza vac split quadrivalent PF  0.5 mL Intramuscular Tomorrow-1000  . insulin aspart  0-9 Units Subcutaneous TID WC  . metoprolol tartrate  100 mg Oral BID  . nitroGLYCERIN  0.5 inch Topical 4 times per day  . sodium chloride  3 mL Intravenous Q12H    Objective: Vital signs in last 24 hours: Temp:  [97.6 F (36.4 C)-98.8 F (37.1 C)] 98.8 F (37.1 C) (02/07 0530) Pulse Rate:  [75-88] 81 (02/07 0639) Resp:   [15-20] 18 (02/07 0530) BP: (156-209)/(72-93) 190/78 mmHg (02/07 0639) SpO2:  [91 %-96 %] 92 % (02/07 0530) FiO2 (%):  [2 %] 2 % (02/06 1353) Weight:  [263 lb (119.296 kg)] 263 lb (119.296 kg) (02/07 0530)  Physical Exam  Constitutional: He is oriented to person, place, and time. He appears well-developed and well-nourished. No distress.  HENT: healed gums from teeth extraction Mouth/Throat: Oropharynx is clear and moist. No oropharyngeal exudate.  Cardiovascular: Normal rate, regular rhythm and normal heart sounds. Exam reveals no gallop and no friction rub.  No murmur heard.  Pulmonary/Chest: Effort normal and breath sounds normal. No respiratory distress. He has no wheezes.  Abdominal: Soft. Bowel sounds are normal. He exhibits no distension. There is no tenderness.  Lymphadenopathy:  He has no cervical adenopathy.  Ext: right shoulder/arm sling. +2 edema lower extremities bilaterally Neurological: He is alert and oriented to person, place, and time.  Skin: Skin is warm and dry. No rash noted. No erythema.  Psychiatric: He has a normal mood and affect. His behavior is normal.     Lab Results  Recent Labs  02/04/15 0545 02/05/15 0500 02/06/15 0500  WBC 4.6 4.7 6.3  HGB 8.1* 8.1* 8.2*  HCT 25.2* 25.3* 25.3*  NA 141  --  137  K 3.6  --  3.7  CL 106  --  104  CO2 27  --  30  BUN 16  --  15  CREATININE 1.81*  --  1.67*   Lab Results  Component Value Date   ESRSEDRATE 111* 01/12/2015   Lab Results  Component Value Date   CRP 20.0* 01/09/2015    Microbiology: 2/3 blood cx ngtd 2/5 synovial fluid NGTD Studies/Results: [place] Scrotum  02/05/2015   CLINICAL DATA:  Right testicular pain and swelling for 1 day  EXAM: SCROTAL ULTRASOUND  DOPPLER ULTRASOUND OF THE TESTICLES  TECHNIQUE: Complete ultrasound examination of the testicles, epididymis, and other scrotal structures was performed. Color and spectral Doppler ultrasound were also utilized to evaluate blood flow to the  testicles.  COMPARISON:  None.  FINDINGS: Right testicle  Measurements: 4.4 x 2.9 x 2.4 cm. No mass or microlithiasis visualized.  Left testicle  Measurements: 4.5 x 2.6 x 2.6 cm. No mass or microlithiasis visualized.  Subjectively minimally prominent internal color flow within both testes.  Right epididymis:  Normal in size and appearance.  Left epididymis:  Normal in size and appearance.  Hydrocele:  None visualized.  Varicocele:  None visualized.  Pulsed Doppler interrogation of both testes demonstrates normal low resistance arterial and venous waveforms bilaterally.  IMPRESSION: No sonographic evidence for intratesticular mass or testicular torsion.  Subjectively minimally prominent internal color flow within both testes which may be a normal variant although orchitis could appear similar.   Electronically Signed   By: Christiana PellantGretchen  Green M.D.   On: 02/05/2015 21:56   Mr Shoulder Right Wo Contrast  02/04/2015   CLINICAL DATA:  Septic arthritis of the right shoulder.  EXAM: MRI OF THE RIGHT SHOULDER WITHOUT CONTRAST  TECHNIQUE: Multiplanar, multisequence MR imaging of the shoulder was performed. No intravenous contrast was administered.  COMPARISON:  MRI dated 01/11/2015 and radiographs dated 01/09/2015  FINDINGS: Glenohumeral Joint: There is a large glenohumeral joint effusion with some debris in the joint. These have increased since the prior exam.  Bones: There is new edema in the head of the humerus around the superior aspect of the bicipital groove. There is no bone destruction but given the patient's known septic joint, the possibility of early osteomyelitis must be considered.  Rotator cuff:  Normal.  Muscles: There is slight edema in and around all of the muscles of the rotator cuff. Some of this appears to be extravasated joint fluid. In the setting of infection, this could represent infectious myositis.  Biceps long head: Properly located and intact. Fluid distends the bicipital tendon sheath.   Acromioclavicular Joint: Slight arthritic changes of the AC joint, stable. New small joint effusion.  Labrum:  Normal.  IMPRESSION: 1. Increased glenohumeral joint effusion with extravasated joint fluid extending around the muscles of the rotator cuff with edema in the muscles. The findings are worrisome for persistent infection in the joint as well as adjacent infectious myositis. 2. New edema in the humeral head adjacent to the bicipital groove. In the setting of septic joint, the possibility of early osteomyelitis has to be considered. 3. New small effusion in the acromioclavicular joint. Given the patient's sepsis, the possibility of infection in the [facility] joint mass be considered.   Electronically Signed   By: Geanie CooleyJim  Maxwell M.D.   On: 02/04/2015 12:13   [place] Art/ven Flow Abd Pelv Doppler  02/05/2015   CLINICAL DATA:  Right testicular pain and swelling for 1 day  EXAM: SCROTAL ULTRASOUND  DOPPLER ULTRASOUND OF THE TESTICLES  TECHNIQUE: Complete ultrasound examination of the testicles, epididymis, and other scrotal structures was performed. Color and spectral  Doppler ultrasound were also utilized to evaluate blood flow to the testicles.  COMPARISON:  None.  FINDINGS: Right testicle  Measurements: 4.4 x 2.9 x 2.4 cm. No mass or microlithiasis visualized.  Left testicle  Measurements: 4.5 x 2.6 x 2.6 cm. No mass or microlithiasis visualized.  Subjectively minimally prominent internal color flow within both testes.  Right epididymis:  Normal in size and appearance.  Left epididymis:  Normal in size and appearance.  Hydrocele:  None visualized.  Varicocele:  None visualized.  Pulsed Doppler interrogation of both testes demonstrates normal low resistance arterial and venous waveforms bilaterally.  IMPRESSION: No sonographic evidence for intratesticular mass or testicular torsion.  Subjectively minimally prominent internal color flow within both testes which may be a normal variant although orchitis could appear similar.    Electronically Signed   By: Christiana Pellant M.D.   On: 02/05/2015 21:56     Assessment/Plan: MSSA endocarditis complicated by right shoulder septic arthritis, cerebral septic emboli, myositis POD #1 from washout of right shoulder - recommend to continue to ampicillin and follow up on synovial fluid cx. If they are positive, may need to consider changing from ampicillin - would restart the antibiotic clock, using 2/7 as day 1 of 30 for septic arthritis - defer to Dr. Cornelius Moras to the timing of valve surgery  Septic arthritis = also likely due to MSSA. Continue with 30 day course of therapy with ampicillin. Will get repeat sed rate and crp  Pedal edema= continue with gentle diuresis  Edwin Cherian, [facility] for Infectious Diseases Cell: [phone] Pager: [phone]  02/06/2015, 9:41 AM

## 2015-02-06 NOTE — Progress Notes (Signed)
Called by Rn for second set of eyes for patient with c/o some numbness on upper lip.  Patient is not real cooperative with an assessment or answering questions, patient very vague on symptoms.  Patient is hypertensive.  No focal neuro deficits noted.  Md paged and awaiting call back.  RN to call if assistance needed

## 2015-02-07 ENCOUNTER — Inpatient Hospital Stay: Payer: Self-pay | Admitting: Family Medicine

## 2015-02-07 ENCOUNTER — Encounter (HOSPITAL_COMMUNITY): Payer: Self-pay | Admitting: Orthopedic Surgery

## 2015-02-07 ENCOUNTER — Inpatient Hospital Stay (HOSPITAL_COMMUNITY): Payer: Medicaid Other

## 2015-02-07 DIAGNOSIS — N179 Acute kidney failure, unspecified: Secondary | ICD-10-CM

## 2015-02-07 DIAGNOSIS — I330 Acute and subacute infective endocarditis: Principal | ICD-10-CM

## 2015-02-07 DIAGNOSIS — N183 Chronic kidney disease, stage 3 unspecified: Secondary | ICD-10-CM | POA: Diagnosis present

## 2015-02-07 DIAGNOSIS — I340 Nonrheumatic mitral (valve) insufficiency: Secondary | ICD-10-CM

## 2015-02-07 DIAGNOSIS — R609 Edema, unspecified: Secondary | ICD-10-CM | POA: Diagnosis present

## 2015-02-07 DIAGNOSIS — I10 Essential (primary) hypertension: Secondary | ICD-10-CM | POA: Insufficient documentation

## 2015-02-07 LAB — MAGNESIUM: MAGNESIUM: 1.9 mg/dL (ref 1.5–2.5)

## 2015-02-07 LAB — SEDIMENTATION RATE: Sed Rate: 93 mm/hr — ABNORMAL HIGH (ref 0–16)

## 2015-02-07 LAB — BASIC METABOLIC PANEL
Anion gap: 4 — ABNORMAL LOW (ref 5–15)
BUN: 18 mg/dL (ref 6–23)
CO2: 29 mmol/L (ref 19–32)
Calcium: 8.3 mg/dL — ABNORMAL LOW (ref 8.4–10.5)
Chloride: 105 mmol/L (ref 96–112)
Creatinine, Ser: 1.8 mg/dL — ABNORMAL HIGH (ref 0.50–1.35)
GFR calc Af Amer: 55 mL/min — ABNORMAL LOW (ref >90)
GFR calc non Af Amer: 47 mL/min — ABNORMAL LOW (ref >90)
GLUCOSE: 154 mg/dL — AB (ref 70–99)
POTASSIUM: 3.7 mmol/L (ref 3.5–5.1)
Sodium: 138 mmol/L (ref 135–145)

## 2015-02-07 LAB — GLUCOSE, CAPILLARY: Glucose-Capillary: 123 mg/dL — ABNORMAL HIGH (ref 70–99)

## 2015-02-07 LAB — C-REACTIVE PROTEIN: CRP: 2.1 mg/dL — ABNORMAL HIGH (ref <0.60)

## 2015-02-07 MED ORDER — DEXTROSE 5 % IV SOLN
120.0000 mg | Freq: Two times a day (BID) | INTRAVENOUS | Status: DC
  Administered 2015-02-07 – 2015-02-10 (×6): 120 mg via INTRAVENOUS
  Filled 2015-02-07 (×7): qty 12

## 2015-02-07 MED ORDER — HYDRALAZINE HCL 50 MG PO TABS
150.0000 mg | ORAL_TABLET | Freq: Three times a day (TID) | ORAL | Status: DC
  Administered 2015-02-07 – 2015-02-10 (×9): 150 mg via ORAL
  Filled 2015-02-07 (×9): qty 3

## 2015-02-07 NOTE — Progress Notes (Signed)
ANTIBIOTIC CONSULT NOTE - FOLLOW UP  Pharmacy Consult for Ampicillin Indication: Endocarditis  No Known Allergies  Patient Measurements: Height: 6' 2"  (188 cm) Weight: 263 lb 7.2 oz (119.5 kg) IBW/kg (Calculated) : 82.2 Adjusted Body Weight:    Vital Signs: BP: 179/72 mmHg (02/08 0530) Pulse Rate: 86 (02/08 0530) Intake/Output from previous day: 02/07 0701 - 02/08 0700 In: 720 [P.O.:720] Out: 1725 [Urine:1725] Intake/Output from this shift:    Labs:  Recent Labs  02/05/15 0500 02/06/15 0500 02/07/15 0543  WBC 4.7 6.3  --   HGB 8.1* 8.2*  --   PLT 318 330  --   CREATININE  --  1.67* 1.80*   Estimated Creatinine Clearance: 79.4 mL/min (by C-G formula based on Cr of 1.8). No results for input(s): VANCOTROUGH, VANCOPEAK, VANCORANDOM, GENTTROUGH, GENTPEAK, GENTRANDOM, TOBRATROUGH, TOBRAPEAK, TOBRARND, AMIKACINPEAK, AMIKACINTROU, AMIKACIN in the last 72 hours.   Microbiology: Recent Results (from the past 720 hour(s))  Urine culture     Status: None   Collection Time: 01/09/15  1:31 AM  Result Value Ref Range Status   Specimen Description URINE, CLEAN CATCH  Final   Special Requests NONE  Final   Colony Count   Final    3,000 COLONIES/ML Performed at [facility]    Culture   Final    INSIGNIFICANT GROWTH Performed at [facility]    Report Status 01/10/2015 FINAL  Final  Stool culture     Status: None   Collection Time: 01/09/15  4:09 AM  Result Value Ref Range Status   Specimen Description VAGINAL/RECTAL  Final   Special Requests Normal  Final   Culture   Final    NO SALMONELLA, SHIGELLA, CAMPYLOBACTER, YERSINIA, OR E.COLI 0157:H7 ISOLATED Performed at [facility]    Report Status 01/12/2015 FINAL  Final  Clostridium Difficile by PCR     Status: None   Collection Time: 01/09/15  4:09 AM  Result Value Ref Range Status   C difficile by pcr NEGATIVE NEGATIVE Final  Culture, blood (routine x 2)     Status: None   Collection Time:  01/10/15  4:35 AM  Result Value Ref Range Status   Specimen Description BLOOD RIGHT HAND  Final   Special Requests BOTTLES DRAWN AEROBIC AND ANAEROBIC 10CC EA  Final   Culture   Final    STAPHYLOCOCCUS AUREUS Note: SUSCEPTIBILITIES PERFORMED ON PREVIOUS CULTURE WITHIN THE LAST 5 DAYS. Note: Culture results may be compromised due to an excessive volume of blood received in culture bottles. Gram Stain Report Called to,Read Back By and Verified With:  MONICA YIM ON 01/10/2015 AT 11:36P BY WILEJ Performed at [facility]    Report Status 01/13/2015 FINAL  Final  Culture, blood (routine x 2)     Status: None   Collection Time: 01/10/15  4:46 AM  Result Value Ref Range Status   Specimen Description BLOOD LEFT ARM  Final   Special Requests BOTTLES DRAWN AEROBIC AND ANAEROBIC 10CC EA  Final   Culture   Final    STAPHYLOCOCCUS AUREUS Note: SUSCEPTIBILITIES PERFORMED ON PREVIOUS CULTURE WITHIN THE LAST 5 DAYS. Note: Culture results may be compromised due to an excessive volume of blood received in culture bottles. Gram Stain Report Called to,Read Back By and Verified With: MONICA YIM ON 01/10/2015 AT 11:36P BY Dennard Nip Performed at [facility]    Report Status 01/13/2015 FINAL  Final  Respiratory virus panel (routine influenza)     Status: None  Collection Time: 01/10/15 10:34 AM  Result Value Ref Range Status   Source - RVPAN NASAL SWAB  Corrected    Comment: CORRECTED ON 01/12 AT 2036: PREVIOUSLY REPORTED AS NASAL SWAB   Respiratory Syncytial Virus A NOT DETECTED  Final   Respiratory Syncytial Virus B NOT DETECTED  Final   Influenza A NOT DETECTED  Final   Influenza B NOT DETECTED  Final   Parainfluenza 1 NOT DETECTED  Final   Parainfluenza 2 NOT DETECTED  Final   Parainfluenza 3 NOT DETECTED  Final   Metapneumovirus NOT DETECTED  Final   Rhinovirus NOT DETECTED  Final   Adenovirus NOT DETECTED  Final   Influenza A H1 NOT DETECTED  Final   Influenza A H3 NOT DETECTED   Final    Comment: (NOTE)       Normal Reference Range for each Analyte: NOT DETECTED Testing performed using the Luminex xTAG Respiratory Viral Panel test kit. The analytical performance characteristics of this assay have been determined by [facility].  The modifications have not been cleared or approved by the FDA. This assay has been validated pursuant to the CLIA regulations and is used for clinical purposes. Performed at [facility], blood (routine x 2)     Status: None   Collection Time: 01/10/15 11:19 AM  Result Value Ref Range Status   Specimen Description BLOOD LEFT FOREARM  Final   Special Requests BOTTLES DRAWN AEROBIC ONLY 5CC  Final   Culture   Final    STAPHYLOCOCCUS AUREUS Note: RIFAMPIN AND GENTAMICIN SHOULD NOT BE USED AS SINGLE DRUGS FOR TREATMENT OF STAPH INFECTIONS. Note: Gram Stain Report Called to,Read Back By and Verified With: NURSE MONICA YIM 01/11/15 6:05AM THOMI Performed at [facility]    Report Status 01/13/2015 FINAL  Final   Organism ID, Bacteria STAPHYLOCOCCUS AUREUS  Final      Susceptibility   Staphylococcus aureus - MIC*    CLINDAMYCIN <=0.25 SENSITIVE Sensitive     ERYTHROMYCIN <=0.25 SENSITIVE Sensitive     GENTAMICIN <=0.5 SENSITIVE Sensitive     LEVOFLOXACIN 4 INTERMEDIATE Intermediate     OXACILLIN 0.5 SENSITIVE Sensitive     PENICILLIN 0.12 SENSITIVE Sensitive     RIFAMPIN 2 INTERMEDIATE Intermediate     TRIMETH/SULFA <=10 SENSITIVE Sensitive     VANCOMYCIN 1 SENSITIVE Sensitive     TETRACYCLINE <=1 SENSITIVE Sensitive     MOXIFLOXACIN 2 RESISTANT Resistant     * STAPHYLOCOCCUS AUREUS  Culture, blood (routine x 2)     Status: None   Collection Time: 01/10/15 11:27 AM  Result Value Ref Range Status   Specimen Description BLOOD LEFT ANTECUBITAL  Final   Special Requests BOTTLES DRAWN AEROBIC ONLY [place]  Final   Culture   Final    STAPHYLOCOCCUS AUREUS Note: SUSCEPTIBILITIES PERFORMED ON PREVIOUS  CULTURE WITHIN THE LAST 5 DAYS. Note: Gram Stain Report Called to,Read Back By and Verified With: NURSE MONICA YIM 01/11/15 6:05AM THOMI Performed at [facility]    Report Status 01/13/2015 FINAL  Final  Culture, blood (routine x 2)     Status: None   Collection Time: 01/13/15  7:20 PM  Result Value Ref Range Status   Specimen Description BLOOD RIGHT ARM  Final   Special Requests BOTTLES DRAWN AEROBIC AND ANAEROBIC 5CC  Final   Culture   Final    NO GROWTH 5 DAYS Performed at [facility]    Report Status 01/20/2015  FINAL  Final  Culture, blood (routine x 2)     Status: None   Collection Time: 01/13/15  7:20 PM  Result Value Ref Range Status   Specimen Description BLOOD RIGHT ARM  Final   Special Requests BOTTLES DRAWN AEROBIC AND ANAEROBIC 3CC  Final   Culture   Final    NO GROWTH 5 DAYS Performed at [facility]    Report Status 01/20/2015 FINAL  Final  Body fluid culture     Status: None   Collection Time: 01/14/15  9:02 AM  Result Value Ref Range Status   Specimen Description FLUID RIGHT SHOULDER SYNOVIAL  Final   Special Requests NONE  Final   Gram Stain   Final    FEW WBC PRESENT,BOTH PMN AND MONONUCLEAR NO ORGANISMS SEEN Performed at [facility]    Culture   Final    NO GROWTH 3 DAYS Performed at [facility]    Report Status 01/17/2015 FINAL  Final  Culture, blood (routine x 2)     Status: None   Collection Time: 01/18/15  6:10 PM  Result Value Ref Range Status   Specimen Description BLOOD LEFT ARM  Final   Special Requests BOTTLES DRAWN AEROBIC AND ANAEROBIC 10CC  Final   Culture   Final    NO GROWTH 5 DAYS Performed at [facility]    Report Status 01/25/2015 FINAL  Final  Culture, blood (routine x 2)     Status: None   Collection Time: 01/18/15  6:18 PM  Result Value Ref Range Status   Specimen Description BLOOD RIGHT ARM  Final   Special Requests BOTTLES DRAWN AEROBIC AND ANAEROBIC 10CC  Final    Culture   Final    NO GROWTH 5 DAYS Performed at [facility]    Report Status 01/25/2015 FINAL  Final  Surgical pcr screen     Status: None   Collection Time: 01/25/15  4:47 AM  Result Value Ref Range Status   MRSA, PCR NEGATIVE NEGATIVE Final   Staphylococcus aureus NEGATIVE NEGATIVE Final    Comment:        The Xpert SA Assay (FDA approved for NASAL specimens in patients over 67 years of age), is one component of a comprehensive surveillance program.  Test performance has been validated by [facility] for patients greater than or equal to 11 year old. It is not intended to diagnose infection nor to guide or monitor treatment.   Culture, blood (routine x 2)     Status: None (Preliminary result)   Collection Time: 02/02/15  3:53 PM  Result Value Ref Range Status   Specimen Description BLOOD PICC LINE  Final   Special Requests BOTTLES DRAWN AEROBIC AND ANAEROBIC 5ML  Final   Culture   Final           BLOOD CULTURE RECEIVED NO GROWTH TO DATE CULTURE WILL BE HELD FOR 5 DAYS BEFORE ISSUING A FINAL NEGATIVE REPORT Performed at [facility]    Report Status PENDING  Incomplete  Culture, blood (routine x 2)     Status: None (Preliminary result)   Collection Time: 02/02/15  4:32 PM  Result Value Ref Range Status   Specimen Description BLOOD PICC LINE  Final   Special Requests BOTTLES DRAWN AEROBIC AND ANAEROBIC 10CC  Final   Culture   Final           BLOOD CULTURE RECEIVED NO GROWTH TO DATE CULTURE WILL BE HELD FOR  5 DAYS BEFORE ISSUING A FINAL NEGATIVE REPORT Performed at [facility]    Report Status PENDING  Incomplete  MRSA PCR Screening     Status: None   Collection Time: 02/02/15  5:14 PM  Result Value Ref Range Status   MRSA by PCR NEGATIVE NEGATIVE Final    Comment:        The GeneXpert MRSA Assay (FDA approved for NASAL specimens only), is one component of a comprehensive MRSA colonization surveillance program. It is not intended to  diagnose MRSA infection nor to guide or monitor treatment for MRSA infections.   Body fluid culture     Status: None (Preliminary result)   Collection Time: 02/04/15  5:45 PM  Result Value Ref Range Status   Specimen Description SYNOVIAL FLUID SHOULDER RIGHT  Final   Special Requests NONE  Final   Gram Stain   Final    FEW WBC PRESENT, PREDOMINANTLY PMN NO ORGANISMS SEEN Performed at [facility]    Culture   Final    NO GROWTH 1 DAY Performed at [facility]    Report Status PENDING  Incomplete  Surgical pcr screen     Status: None   Collection Time: 02/05/15  5:06 AM  Result Value Ref Range Status   MRSA, PCR NEGATIVE NEGATIVE Final   Staphylococcus aureus NEGATIVE NEGATIVE Final    Comment:        The Xpert SA Assay (FDA approved for NASAL specimens in patients over 69 years of age), is one component of a comprehensive surveillance program.  Test performance has been validated by [facility] for patients greater than or equal to 50 year old. It is not intended to diagnose infection nor to guide or monitor treatment.     Anti-infectives    Start     Dose/Rate Route Frequency Ordered Stop   02/02/15 1700  ampicillin (OMNIPEN) 2 g in sodium chloride 0.9 % 50 mL IVPB     2 g150 mL/hr over 20 Minutes Intravenous Every 4 hours 02/02/15 1638        Assessment: 35 yo M presents on 2/3 with CP. 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 Pharmacy to continue dosing ampicillin for endocarditis. Pt is afebrile and WBC wnl. SCr 1.73, CrCl ~70m/min.  PMH: DM, HTN, ARF, MSSA bacteremia w/ endocarditis  Anticoagulation: none  Infectious Disease: Ampicillin for 'Penicillin Sensitive Staphylococcus aureus bacteremia, mitral valve endocarditis with perforation, septic emboli to the brain and right septic shoulder and thoracic spine discitis. Now s/p arthroscopic debridement and subacromial decompression for septic right shoulder. ID  did see last admit (1/29) and plans were 6 weeks of ampicillin with day 1 being 1/14. Of note pt was on a once a day ampicillin 12g over 24hrs for ease of administration. I&D of shoulder this AM. Will need long term treatment for this before valve replacement surgergy. Cultures pending  1/14 ampicillin (6wks)>>  2/3 blood x2>> 2/5 Synovial fluid>>  Cardiovascular: Acute on chronic diastolic dysfunction EF 689% Caused by mitral valve endocarditis and dysfunction. Per CVTS- will likely need mitral valve repair/replacement during this hospitalization, but not until his condition has been optimized. For repeat TEE. Wt= 263lb (up 21 lbs from last discharge). Patient adamently noncompliant with low-Na+ diet. BP 179/72, HR 86. Meds increased. Aggressively diurese. Watchr enal. Meds: Coreg, IV Lasix, Hydralazine increased, Imdur increased  Endocrinology: DM. glucose better on SSI. Glucose 123-249  Neuro: Adamently noncompliant. Has been educated multiple times.  Nephrology:  Chronic kidney disease stage IV. Baseline creatinine around 2. SCr= 1.8 up and CrCl ~ 79  Hematology / Oncology: Hgb low but stable  PTA Medication Issues: insulin 70/30   Plan:  Ampicillin 2g IV q4h.   Arlow Spiers S. Alford Highland, PharmD, [facility] Clinical Staff Pharmacist Pager [phone]  Eilene Ghazi Stillinger 02/07/2015,9:50 AM

## 2015-02-07 NOTE — Progress Notes (Signed)
Paged MRI for testing previously ordered. No answer

## 2015-02-07 NOTE — Progress Notes (Signed)
Subjective:  Lethargic but responds appropriatley, no SOB  Objective:  Vital Signs in the last 24 hours: Temp:  [98.7 F (37.1 C)-98.9 F (37.2 C)] 98.9 F (37.2 C) (02/07 2059) Pulse Rate:  [83-86] 86 (02/08 0530) Resp:  [16-18] 18 (02/08 0530) BP: (169-206)/(72-121) 179/72 mmHg (02/08 0530) SpO2:  [96 %-100 %] 96 % (02/08 0530) Weight:  [263 lb 7.2 oz (119.5 kg)] 263 lb 7.2 oz (119.5 kg) (02/08 0530)  Intake/Output from previous day:  Intake/Output Summary (Last 24 hours) at 02/07/15 1029 Last data filed at 02/06/15 2039  Gross per 24 hour  Intake    480 ml  Output   1725 ml  Net  -1245 ml    Physical Exam: General appearance: cooperative and no distress Lungs: decreased breath sounds, poor effort Heart: regular rate and rhythm and soft systolic murmur   Rate: 78  Rhythm: normal sinus rhythm  Lab Results:  Recent Labs  02/05/15 0500 02/06/15 0500  WBC 4.7 6.3  HGB 8.1* 8.2*  PLT 318 330    Recent Labs  02/06/15 0500 02/07/15 0543  NA 137 138  K 3.7 3.7  CL 104 105  CO2 30 29  GLUCOSE 159* 154*  BUN 15 18  CREATININE 1.67* 1.80*   No results for input(s): TROPONINI in the last 72 hours.  Invalid input(s): CK, MB No results for input(s): INR in the last 72 hours.  Imaging: Imaging results have been reviewed  Cardiac Studies:  Assessment/Plan:  35 year old male discharged on 01/27/2015 with mitral valve endocarditis with MSSA. This was complicated by septic emboli to the brain and possible emboli of the kidney and lower extremity as well. 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 He had normal LV function and moderate to severe LVH. A left heart cath on 01/24/2015 showed normal coronarys and moderate pulmonary hypertension with elevated LV filling pressures with large V wave consistent with  severe MR. Patient underwent dental extraction on 01/25/2015. He was evaluated by cardiothoracic surgery who favored 6 week course of antibiotics before proceeding with mitral valve replacement. 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 He was re admitted 02/02/15 with CHF.   Principal Problem:   Acute diastolic CHF (congestive heart failure), NYHA class 3 Active Problems:   Severe mitral regurgitation   Endocarditis   Arthritis, septic, shoulder   Diabetes type 2, uncontrolled   Uncontrolled hypertension   Anasarca   Chronic renal insufficiency, stage III (moderate)   Acute renal failure   PLAN:  MD to see. Getting PT s/p Rt shoulder debridement 02/05/15. I/O negative 7.2 L since admission. He is on IV Lasix 80 mg BID, Coreg 25 mg BID and Nitrates.   Corine ShelterLuke Kilroy PA-C [facility] [phone] 02/07/2015, 10:29 AM  I have seen and examined the patient along with Corine ShelterLuke Kilroy PA-C.  I have reviewed the chart, notes and new data.  I agree with PA's note.  Key new complaints: sedated, lethargic Key examination changes: edematous; despite reported 7L negative fluid balance, weight unchanged since admission. Key new findings / data: creat stable  PLAN: Increase diuretics. Will need MV surgery once all other  foci of infection have been addressed.  Thurmon Fair, MD, [facility] [phone] 02/07/2015, 11:20 AM

## 2015-02-07 NOTE — Progress Notes (Signed)
PT Cancellation Note  Patient Details Name: Dwayne HickmanBenjamin [name] MRN: [id number] DOB: 04/26/1980   Cancelled Treatment:    Reason Eval/Treat Not Completed: Patient declined, no reason specified   Berline LopesWhite, Zeya Balles F 02/07/2015, 12:13 PM  Seham Gardenhire,PT Acute Rehabilitation [phone] (pager)

## 2015-02-07 NOTE — Progress Notes (Signed)
UR COMPLETED  

## 2015-02-07 NOTE — Progress Notes (Signed)
PROGRESS NOTE  Dwayne [name] RUE:[id number] DOB: 1980-02-01 DOA: 02/02/2015 PCP: No PCP Per Patient  HPI/Recap of past 24 hours:  s/p right shoulder washout. Post op day 2.  Scrotal US unremarkable, less scrotal edema. bp better after meds adjustment. Wife at bedside. Does not want to engage care actively, states his wife is a CNA , normally take cares of his medical needs and information.  Assessment/Plan: Principal Problem:   Acute diastolic CHF (congestive heart failure), NYHA class 3 Active Problems:   Diabetes type 2, uncontrolled   Uncontrolled hypertension   Acute renal failure   Severe mitral regurgitation   Endocarditis   Anasarca   Arthritis, septic, shoulder   Chronic renal insufficiency, stage III (moderate)   Acute renal failure superimposed on stage 3 chronic kidney disease   Bacterial endocarditis 1. Acute on chronic diastolic dysfunction EF 60%. 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 stepdown unit, IV Lasix increased to 120 mg twice on 2/8. On coreg/imdur/hydralazine, cardiology following. Supportive care with oxygen and nebulizer treatments.  2. Endocarditis: Continue IV ampicillin dosed by pharmacy for now. Infectious disease consulted. Has left arm PICC line. Vascular surgery consulted.  3. Right shoulder septic arthritis.   Mri shoulder on 2/5 concerning for septic arthritis/osteo? [facility] Dr. Lajoyce Corners consulted.   2/6 patient was brought to the OR and had:  Right shoulder ARTHROSCOPY SHOULDER  Extensive debridement of the glenohumeral joint. Subacromial decompression.   4. Embolic infarcts: due to c/o facial numbness on 2/7, mri brain repeated, result: While several of these foci appear to be of slightly different ages, these are new relative to prior MRI from 01/14/2015, where the previously seen infarcts have resolved/normalized. No significant mass effect or parenchymal hemorrhage. Overall, the number of infarcts is  decreased from prior study.  5. Dental caries. Multiple teeth removed one week ago prior to this admission. Still requiring prn pain meds.  6. DM type II. Last A1c was above 8. A new home dose 70-30, add sliding scale.    7. Essential hypertension.  Uncontrolled. meds adjusted: change lopressor to coreg, added imdur, increase hydralazine.    8. Chronic kidney disease stage IV. Baseline creatinine around 2. Currently at or better than baseline.  Patient has poor insight to disease process, patient is not interested in actively learning about it.  Code Status: full  Family Communication: patient  Disposition Plan: remain in patient   Consultants:  Cardiology/cardiothoracic surgery/infectious disease/ortho  Procedures: 2/6 Right shoulder jointARTHROSCOPY SHOULDER  Extensive debridement of the glenohumeral joint. Subacromial decompression.  Antibiotics:  ampicillin   Objective: BP 150/71 mmHg  Pulse 86  Temp(Src) 98.9 F (37.2 C) (Oral)  Resp 18  Ht  (1.88 m)  Wt 119.5 kg (263 lb 7.2 oz)  BMI 33.81 kg/m2  SpO2 97%  Intake/Output Summary (Last 24 hours) at 02/07/15 1552 Last data filed at 02/07/15 1130  Gross per 24 hour  Intake    240 ml  Output   2350 ml  Net  -2110 ml   Filed Weights   02/05/15 0515 02/06/15 0530 02/07/15 0530  Weight: 119 kg (262 lb 5.6 oz) 119.296 kg (263 lb) 119.5 kg (263 lb 7.2 oz)    Exam:   General:  Aaox3, NAD  Cardiovascular: RRR, ? murmur  Respiratory: decreased at bases, no wheezing/no rhonchi/ rales?  Abdomen: soft/NT/ND, positive bowel sounds  Musculoskeletal: pitting edema bilateral, right shoulder dressing intact.  Data Reviewed: Basic Metabolic Panel:  Recent Labs Lab 02/02/15 1035  02/03/15 0550 02/04/15 0545 02/05/15 0500 02/06/15 0500 02/07/15 0543  NA 142 139 141  --  137 138  K 3.6 3.5 3.6  --  3.7 3.7  CL 110 107 106  --  104 105  CO2 --  30 29  GLUCOSE 65* 126* 142*  --  159*  154*  BUN --  15 18  CREATININE 1.73* 1.78* 1.81*  --  1.67* 1.80*  CALCIUM 8.2* 7.8* 7.8*  --  7.9* 8.3*  MG  --   --  1.9 1.8 1.8 1.9   Liver Function Tests:  Recent Labs Lab 02/02/15 1035  AST 18  ALT 20  ALKPHOS 112  BILITOT 0.3  PROT 6.5  ALBUMIN 1.6*   No results for input(s): LIPASE, AMYLASE in the last 168 hours. No results for input(s): AMMONIA in the last 168 hours. CBC:  Recent Labs Lab 02/02/15 1035 02/03/15 0550 02/04/15 0545 02/05/15 0500 02/06/15 0500  WBC 6.3 5.8 4.6 4.7 6.3  NEUTROABS 4.2  --   --   --   --   HGB 9.0* 8.2* 8.1* 8.1* 8.2*  HCT 27.2* 24.9* 25.2* 25.3* 25.3*  MCV 86.1 85.0 87.5 85.8 86.1  PLT 316 285 292 318 330   Cardiac Enzymes:   No results for input(s): CKTOTAL, CKMB, CKMBINDEX, TROPONINI in the last 168 hours. BNP (last 3 results)  Recent Labs  01/17/15 0535 01/22/15 0347 02/02/15 1035  BNP 315.1* 218.9* 629.0*    ProBNP (last 3 results) No results for input(s): PROBNP in the last 8760 hours.  CBG:  Recent Labs Lab 02/06/15 0727 02/06/15 1115 02/06/15 1622 02/06/15 2034 02/07/15 0750  GLUCAP 156* 249* 165* 197* 123*    Recent Results (from the past 240 hour(s))  Culture, blood (routine x 2)     Status: None (Preliminary result)   Collection Time: 02/02/15  3:53 PM  Result Value Ref Range Status   Specimen Description BLOOD PICC LINE  Final   Special Requests BOTTLES DRAWN AEROBIC AND ANAEROBIC  Final   Culture   Final           BLOOD CULTURE RECEIVED NO GROWTH TO DATE CULTURE WILL BE HELD FOR 5 DAYS BEFORE ISSUING A FINAL NEGATIVE REPORT Performed at [facility]    Report Status PENDING  Incomplete  Culture, blood (routine x 2)     Status: None (Preliminary result)   Collection Time: 02/02/15  4:32 PM  Result Value Ref Range Status   Specimen Description BLOOD PICC LINE  Final   Special Requests BOTTLES DRAWN AEROBIC AND ANAEROBIC 10CC  Final   Culture   Final           BLOOD  CULTURE RECEIVED NO GROWTH TO DATE CULTURE WILL BE HELD FOR 5 DAYS BEFORE ISSUING A FINAL NEGATIVE REPORT Performed at [facility]    Report Status PENDING  Incomplete  MRSA PCR Screening     Status: None   Collection Time: 02/02/15  5:14 PM  Result Value Ref Range Status   MRSA by PCR NEGATIVE NEGATIVE Final    Comment:        The GeneXpert MRSA Assay (FDA approved for NASAL specimens only), is one component of a comprehensive MRSA colonization surveillance program. It is not intended to diagnose MRSA infection nor to guide or monitor treatment for MRSA infections.   Body fluid culture     Status: None (Preliminary result)  Collection Time: 02/04/15  5:45 PM  Result Value Ref Range Status   Specimen Description SYNOVIAL FLUID SHOULDER RIGHT  Final   Special Requests NONE  Final   Gram Stain   Final    FEW WBC PRESENT, PREDOMINANTLY PMN NO ORGANISMS SEEN [facility]formed at [facility]    Culture   Final    NO GROWTH 2 DAYS Performed at [facility]    Report Status PENDING  Incomplete  Surgical pcr screen     Status: None   Collection Time: 02/05/15  5:06 AM  Result Value Ref Range Status   MRSA, PCR NEGATIVE NEGATIVE Final   Staphylococcus aureus NEGATIVE NEGATIVE Final    Comment:        The Xpert SA Assay (FDA approved for NASAL specimens in patients over 35 years of age), is one component of a comprehensive surveillance program.  Test performance has been validated by [facility] for patients greater than or equal to 35 year old. It is not intended to diagnose infection nor to guide or monitor treatment.      Studies: No results found.  Scheduled Meds: . ampicillin (OMNIPEN) IV  2 g Intravenous Q4H  . carvedilol  25 mg Oral BID WC  . docusate sodium  100 mg Oral BID  . feeding supplement (ENSURE COMPLETE)  237 mL Oral TID WC  . furosemide  120 mg Intravenous BID  . hydrALAZINE  150 mg Oral 3 times per day  . Influenza vac split  quadrivalent PF  0.5 mL Intramuscular Tomorrow-1000  . insulin aspart  0-9 Units Subcutaneous TID WC  . isosorbide mononitrate  60 mg Oral Daily  . sodium chloride  3 mL Intravenous Q12H    Continuous Infusions: . sodium chloride 10 mL/hr at 02/07/15 0132  none     Clair Bardwell  [facility] Pager [phone]. If 7PM-7AM, please contact night-coverage at [email or web address], password [email or web address] 02/07/2015, 3:52 PM  LOS: 5 days

## 2015-02-07 NOTE — Progress Notes (Addendum)
Occupational Therapy Evaluation Patient Details Name: Dwayne [name] [place] MRN: [id number] DOB: 02/26/1980 Today's Date: 02/07/2015    History of Present Illness Dwayne [name] Leibensperger  is a 35 y.o. 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 He was planned to undergo open heart surgery for valve replacement in 4-6 weeks. admitted with chest pain, anasarca, scrotal edema, and R shoulder septic arthritis; Underwent R shoulder I& D and subacromial decompression on 2/6   Clinical Impression   PTA, pt independent with ADL and mobility. Initiated ROM today with pendulums in standing and AAROM/PROM R UE in supine within pain tolerance. Recommend pt follow up with HHOT (per pt  -receiving HH nursing) until he can transition to outpt therapy. Will follow acutely to address established goals.      Follow Up Recommendations  Home health OT;Intermittent S    Equipment Recommendations  Tub/shower seat    Recommendations for Other Services       Precautions / Restrictions Precautions Precautions: Other (comment) (scrotal edema) Restrictions Weight Bearing Restrictions: No      Mobility Bed Mobility   Bed Mobility: Sit to Supine       Sit to supine: Modified independent (Device/Increase time)      Transfers Overall transfer level: Needs assistance Equipment used: 1 person hand held assist   Sit to Stand: Min guard              Balance Overall balance assessment: No apparent balance deficits (not formally assessed)                                          ADL Overall ADL's : Needs assistance/impaired Eating/Feeding: Set up    Grooming: Minimal assistance;Sitting   Upper Body Bathing: Sitting;Minimal assitance   Lower Body Bathing: Minimal assistance;Sit to/from stand   Upper Body Dressing : Moderate assistance;Sitting   Lower Body Dressing: Moderate assistance;Sit to/from stand   Toilet Transfer: Min guard;Ambulation   Toileting- Clothing Manipulation and Hygiene: Minimal assistance;Sit to/from stand Toileting - Clothing Manipulation Details (indicate cue type and reason): discussed option of using brief cut underwear to give increased scotal support. Pt declined     Functional mobility during ADLs: Min guard       Vision                     Perception     Praxis      Pertinent Vitals/Pain Pain Assessment: Faces Faces Pain Scale: Hurts whole lot Pain Location: R shoulder Pain Descriptors / Indicators: Discomfort;Grimacing;Guarding Pain Intervention(s): Limited activity within patient's tolerance;Monitored during session;Repositioned     Hand Dominance Right   Extremity/Trunk Assessment Upper Extremity Assessment Upper Extremity Assessment: RUE deficits/detail RUE Deficits / Details: general RUE edema. hand/wrist AROM WFL. elbow flex/ext AAROM WFL; unable to initiate any AROM of the shoulder; able to tolerate P/AAROM to @ 70 FF RUE: Unable to fully assess due to pain RUE Coordination: decreased gross motor;decreased fine motor   Lower Extremity Assessment Lower Extremity Assessment: Defer to PT evaluation (general BLE edema)   Cervical / Trunk Assessment Cervical / Trunk  Assessment: Normal   Communication Communication Communication: No difficulties   Cognition Arousal/Alertness: Awake/alert Behavior During Therapy: WFL for tasks assessed/performed;Flat affect (Needing lots of encouragement) Overall Cognitive Status: Within Functional Limits for tasks assessed                     General Comments       Exercises   Other Exercises Other Exercises: composite  finger flexion/extension Other Exercises: wrist flex/ext Other Exercises: supination/pronation Other Exercises: pendulums in standing Other Exercises: AAROM supine FF to @ 70. Used distraction in extension to decrease symptoms of impingement  Educated on importance of keeping RUE elevated to assist with edema control   Shoulder Instructions      Home Living Family/patient expects to be discharged to:: Private residence Living Arrangements: Spouse/significant other;Parent Available Help at Discharge: Family;Available PRN/intermittently Type of Home: House Home Access: Stairs to enter [employer] of Steps: 3 Entrance Stairs-Rails: Right;Left Home Layout: One level     Bathroom Shower/Tub: Tub/shower unit Shower/tub characteristics: Engineer, building services: Standard     Home Equipment: None          Prior Functioning/Environment Level of Independence: Independent             OT Diagnosis: Generalized weakness;Acute pain   OT Problem List: Decreased strength;Decreased range of motion;Decreased activity tolerance;Decreased coordination;Decreased knowledge of precautions;Cardiopulmonary status limiting activity;Pain;Impaired UE functional use;Increased edema   OT Treatment/Interventions: Self-care/ADL training;Therapeutic exercise;DME and/or AE instruction;Therapeutic activities;Patient/family education    OT Goals(Current goals can be found in the care plan section) Acute Rehab OT Goals Patient Stated Goal: to be able to not have pain and use my arm OT Goal Formulation: With patient Time For Goal Achievement: 02/21/15 Potential to Achieve Goals: Good  OT Frequency: Min 3X/week   Barriers to D/C:            Co-evaluation              End of Session Nurse Communication: Mobility status  Activity Tolerance: Patient tolerated treatment well Patient left: in bed;with call bell/phone within reach   Time: 1450-1511 OT Time Calculation (min): 21  min Charges:  OT General Charges $OT Visit: 1 Procedure OT Evaluation $Initial OT Evaluation Tier I: 1 Procedure G-Codes:    Mikailah Morel,HILLARY 02/13/15, 3:32 PM   [facility], OTR/L  [phone] 02-13-15

## 2015-02-07 NOTE — Progress Notes (Signed)
Physical Therapy Treatment Patient Details Name: Dwayne [name] MRN: [id number] DOB: Jan 25, 1980 Today's Date: 02/07/2015    History of Present Illness Dwayne [name]  is a 35 y.o. 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 He was planned to undergo open heart surgery for valve replacement in 4-6 weeks. admitted with chest pain, anasarca, scrotal edema, and R shoulder septic arthritis; Underwent R shoulder I& D and subacromial decompression on 2/6    PT Comments    Pt admitted with above diagnosis. Pt currently with functional limitations due to balance and endurance deficits.  Will still follow pt as pt not getting up very much and need to ensure he continues to mobilize.  Pt with full body edema.    Pt will benefit from skilled PT to increase their independence and safety with mobility to allow discharge to the venue listed below.    Follow Up Recommendations  Home health PT;Supervision - Intermittent     Equipment Recommendations  None recommended by PT    Recommendations for Other Services       Precautions / Restrictions Precautions Precautions: Other (comment) (scrotal edema) Restrictions Weight Bearing Restrictions: No    Mobility  Bed Mobility Overal bed mobility: Modified Independent Bed Mobility: Supine to Sit     Supine to sit: Modified independent (Device/Increase time) Sit to supine: Modified independent (Device/Increase time)      Transfers Overall transfer level: Needs assistance Equipment used: 1 person hand held assist Transfers: Sit to/from Stand Sit to Stand: Min guard         General transfer comment:  [place]y assist with suport given at pt's L elbow; overall good rise  Ambulation/Gait Ambulation/Gait assistance: Min guard Ambulation Distance (Feet): 200 Feet Assistive device: None Gait Pattern/deviations: Wide base of support Gait velocity: quite slow Gait velocity interpretation: Below normal speed for age/gender General Gait Details: discomfort from scrotal edema slower gait and contributing to wide step width; was able to use L hand (in shorts pocket) to support scrotum during walk; no gross loss of balance noted   Stairs            Wheelchair Mobility    Modified Rankin (Stroke Patients Only)       Balance Overall balance assessment: No apparent balance deficits (not formally assessed)                                  Cognition Arousal/Alertness: Awake/alert Behavior During Therapy: WFL for tasks assessed/performed;Flat affect (Needing lots of encouragement) Overall Cognitive Status: Within Functional Limits for tasks assessed                      Exercises Other Exercises Other Exercises: composite finger flexion/extension Other Exercises: wrist flex/ext Other Exercises: supination/pronation Other Exercises: pendulums in standing Other Exercises: AAROM supine FF to @ 70. Used distraction in extension to decrease symptoms of impingement    General Comments General comments (skin integrity, edema, etc.): Full body edema      Pertinent Vitals/Pain Pain Assessment: Faces Pain Score: 8  Faces Pain Scale: Hurts whole lot Pain Location: right shoulder Pain Descriptors /  Indicators: Discomfort;Guarding;Grimacing Pain Intervention(s): Limited activity within patient's tolerance;Monitored during session;Repositioned;RN gave pain meds during session  VSS    Home Living Family/patient expects to be discharged to:: Private residence Living Arrangements: Spouse/significant other;Parent Available Help at Discharge: Family;Available  PRN/intermittently Type of Home: House Home Access: Stairs to enter Entrance Stairs-Rails: Right;Left Home Layout: One level Home Equipment: None      Prior Function Level of Independence: Independent          PT Goals (current goals can now be found in the care plan section) Acute Rehab PT Goals Patient Stated Goal: to be able to not have pain and use my arm Progress towards PT goals: Progressing toward goals    Frequency  Min 3X/week    PT Plan Discharge plan needs to be updated    Co-evaluation             End of Session Equipment Utilized During Treatment: Gait belt Activity Tolerance: Patient limited by pain Patient left: in bed;with call bell/phone within reach;with nursing/sitter in room     Time: 1433-1450 PT Time Calculation (min) (ACUTE ONLY): 17 min  Charges:  $Gait Training: 8-22 mins                    G CodesBerline Lopes:      Rudolpho Claxton F 02/07/2015, 4:08 PM [employer]wn Sylvan Sookdeo,PT Acute Rehabilitation [phone])

## 2015-02-07 NOTE — Progress Notes (Signed)
[facility]    Subjective:   Complaining of some shoulder pain   Antibiotics:  Anti-infectives    Start     Dose/Rate Route Frequency Ordered Stop   02/02/15 1700  ampicillin (OMNIPEN) 2 g in sodium chloride 0.9 % 50 mL IVPB     2 g150 mL/hr over 20 Minutes Intravenous Every 4 hours 02/02/15 1638        Medications: Scheduled Meds: . ampicillin (OMNIPEN) IV  2 g Intravenous Q4H  . carvedilol  25 mg Oral BID WC  . docusate sodium  100 mg Oral BID  . feeding supplement (ENSURE COMPLETE)  237 mL Oral TID WC  . furosemide  120 mg Intravenous BID  . hydrALAZINE  150 mg Oral 3 times per day  . Influenza vac split quadrivalent PF  0.5 mL Intramuscular Tomorrow-1000  . insulin aspart  0-9 Units Subcutaneous TID WC  . isosorbide mononitrate  60 mg Oral Daily  . sodium chloride  3 mL Intravenous Q12H   Continuous Infusions: . sodium chloride 10 mL/hr at 02/07/15 0132   PRN Meds:.acetaminophen, alum & mag hydroxide-simeth, bisacodyl, guaiFENesin-dextromethorphan, hydrALAZINE, magnesium hydroxide, methocarbamol **OR** methocarbamol (ROBAXIN)  IV, metoCLOPramide **OR** metoCLOPramide (REGLAN) injection, ondansetron **OR** ondansetron (ZOFRAN) IV, ondansetron **OR** ondansetron (ZOFRAN) IV, oxyCODONE-acetaminophen, senna-docusate    Objective: Weight change: 7.2 oz (0.204 kg)  Intake/Output Summary (Last 24 hours) at 02/07/15 1350 Last data filed at 02/07/15 1130  Gross per 24 hour  Intake    240 ml  Output   2350 ml  Net  -2110 ml   Blood pressure 179/72, pulse 86, temperature 98.9 F (37.2 C), temperature source Oral, resp. rate 18, height 6' 2"  (1.88 m), weight 263 lb 7.2 oz (119.5 kg), SpO2 96 %. Temp:  [98.7 F (37.1 C)-98.9 F (37.2 C)] 98.9 F (37.2 C) (02/07 2059) Pulse Rate:  [83-86] 86 (02/08 0530) Resp:  [16-18] 18 (02/08 0530) BP: (169-206)/(72-121) 179/72 mmHg (02/08 0530) SpO2:  [96 %-100 %] 96 % (02/08 0530) Weight:  [263 lb 7.2  oz (119.5 kg)] 263 lb 7.2 oz (119.5 kg) (02/08 0530)  Physical Exam: General: aox3  CV: Systolic murmur loudest at PMI Pulm: clear, no wheezes or rhonchi  Shoulder: with dressing Neuro; non focal CBC:   CBC Latest Ref Rng 02/06/2015 02/05/2015 02/04/2015  WBC 4.0 - 10.5 K/uL 6.3 4.7 4.6  Hemoglobin 13.0 - 17.0 g/dL 8.2(L) 8.1(L) 8.1(L)  Hematocrit 39.0 - 52.0 % 25.3(L) 25.3(L) 25.2(L)  Platelets 150 - 400 K/uL 330 318 292     BMET  Recent Labs  02/06/15 0500 02/07/15 0543  NA 137 138  K 3.7 3.7  CL 104 105  CO2 30 29  GLUCOSE 159* 154*  BUN 15 18  CREATININE 1.67* 1.80*  CALCIUM 7.9* 8.3*     Liver Panel  No results for input(s): PROT, ALBUMIN, AST, ALT, ALKPHOS, BILITOT, BILIDIR, IBILI in the last 72 hours.     Sedimentation Rate  Recent Labs  02/07/15 0543  ESRSEDRATE 93*   C-Reactive Protein  Recent Labs  02/07/15 0543  CRP 2.1*    Micro Results: Recent Results (from the past 720 hour(s))  Urine culture     Status: None   Collection Time: 01/09/15  1:31 AM  Result Value Ref Range Status   Specimen Description URINE, CLEAN CATCH  Final   Special Requests NONE  Final   Colony Count   Final    3,000 COLONIES/ML Performed at [facility]    Culture   Final    INSIGNIFICANT GROWTH Performed at [facility]    Report Status 01/10/2015 FINAL  Final  Stool culture     Status: None   Collection Time: 01/09/15  4:09 AM  Result Value Ref Range Status   Specimen Description VAGINAL/RECTAL  Final   Special Requests Normal  Final   Culture   Final    NO SALMONELLA, SHIGELLA, CAMPYLOBACTER, YERSINIA, OR E.COLI 0157:[id number] ISOLATED Performed at [facility]    Report Status 01/12/2015 FINAL  Final  Clostridium Difficile by PCR     Status: None   Collection Time: 01/09/15  4:09 AM  Result Value Ref Range Status   C difficile by pcr NEGATIVE NEGATIVE Final  Culture, blood (routine x 2)     Status: None   Collection Time:  01/10/15  4:35 AM  Result Value Ref Range Status   Specimen Description BLOOD RIGHT HAND  Final   Special Requests BOTTLES DRAWN AEROBIC AND ANAEROBIC 10CC EA  Final   Culture   Final    STAPHYLOCOCCUS AUREUS Note: SUSCEPTIBILITIES PERFORMED ON PREVIOUS CULTURE WITHIN THE LAST 5 DAYS. Note: Culture results may be compromised due to an excessive volume of blood received in culture bottles. Gram Stain Report Called to,Read Back By and Verified With:  MONICA YIM ON 01/10/2015 AT 11:36P BY WILEJ Performed at [facility]    Report Status 01/13/2015 FINAL  Final  Culture, blood (routine x 2)     Status: None   Collection Time: 01/10/15  4:46 AM  Result Value Ref Range Status   Specimen Description BLOOD LEFT ARM  Final   Special Requests BOTTLES DRAWN AEROBIC AND ANAEROBIC 10CC EA  Final   Culture   Final    STAPHYLOCOCCUS AUREUS Note: SUSCEPTIBILITIES PERFORMED ON PREVIOUS CULTURE WITHIN THE LAST 5 DAYS. Note: Culture results may be compromised due to an excessive volume of blood received in culture bottles. Gram Stain Report Called to,Read Back By and Verified With: MONICA YIM ON 01/10/2015 AT 11:36P BY WILEJ Performed at [facility]    Report Status 01/13/2015 FINAL  Final  Respiratory virus panel (routine influenza)     Status: None   Collection Time: 01/10/15 10:34 AM  Result Value Ref Range Status   Source - RVPAN NASAL SWAB  Corrected    Comment: CORRECTED ON 01/12 AT 2036: PREVIOUSLY REPORTED AS NASAL SWAB   Respiratory Syncytial Virus A NOT DETECTED  Final   Respiratory Syncytial Virus B NOT DETECTED  Final   Influenza A NOT DETECTED  Final   Influenza B NOT DETECTED  Final   Parainfluenza 1 NOT DETECTED  Final   Parainfluenza 2 NOT DETECTED  Final   Parainfluenza 3 NOT DETECTED  Final   Metapneumovirus NOT DETECTED  Final   Rhinovirus NOT DETECTED  Final   Adenovirus NOT DETECTED  Final   Influenza A H1 NOT DETECTED  Final   Influenza A H3 NOT DETECTED   Final    Comment: (NOTE)       Normal Reference Range for each Analyte: NOT DETECTED Testing performed using the Luminex xTAG Respiratory Viral Panel test kit. The analytical performance characteristics of this assay have been determined by [facility].  The modifications have not been cleared or approved by the FDA. This assay has been validated pursuant to the CLIA regulations and is used for clinical purposes. Performed at [facility], blood (  routine x 2)     Status: None   Collection Time: 01/10/15 11:19 AM  Result Value Ref Range Status   Specimen Description BLOOD LEFT FOREARM  Final   Special Requests BOTTLES DRAWN AEROBIC ONLY 5CC  Final   Culture   Final    STAPHYLOCOCCUS AUREUS Note: RIFAMPIN AND GENTAMICIN SHOULD NOT BE USED AS SINGLE DRUGS FOR TREATMENT OF STAPH INFECTIONS. Note: Gram Stain Report Called to,Read Back By and Verified With: NURSE MONICA YIM 01/11/15 6:05AM THOMI Performed at [facility]    Report Status 01/13/2015 FINAL  Final   Organism ID, Bacteria STAPHYLOCOCCUS AUREUS  Final      Susceptibility   Staphylococcus aureus - MIC*    CLINDAMYCIN <=0.25 SENSITIVE Sensitive     ERYTHROMYCIN <=0.25 SENSITIVE Sensitive     GENTAMICIN <=0.5 SENSITIVE Sensitive     LEVOFLOXACIN 4 INTERMEDIATE Intermediate     OXACILLIN 0.5 SENSITIVE Sensitive     PENICILLIN 0.12 SENSITIVE Sensitive     RIFAMPIN 2 INTERMEDIATE Intermediate     TRIMETH/SULFA <=10 SENSITIVE Sensitive     VANCOMYCIN 1 SENSITIVE Sensitive     TETRACYCLINE <=1 SENSITIVE Sensitive     MOXIFLOXACIN 2 RESISTANT Resistant     * STAPHYLOCOCCUS AUREUS  Culture, blood (routine x 2)     Status: None   Collection Time: 01/10/15 11:27 AM  Result Value Ref Range Status   Specimen Description BLOOD LEFT ANTECUBITAL  Final   Special Requests BOTTLES DRAWN AEROBIC ONLY [place]  Final   Culture   Final    STAPHYLOCOCCUS AUREUS Note: SUSCEPTIBILITIES PERFORMED ON PREVIOUS  CULTURE WITHIN THE LAST 5 DAYS. Note: Gram Stain Report Called to,Read Back By and Verified With: NURSE MONICA YIM 01/11/15 6:05AM THOMI Performed at [facility]    Report Status 01/13/2015 FINAL  Final  Culture, blood (routine x 2)     Status: None   Collection Time: 01/13/15  7:20 PM  Result Value Ref Range Status   Specimen Description BLOOD RIGHT ARM  Final   Special Requests BOTTLES DRAWN AEROBIC AND ANAEROBIC 5CC  Final   Culture   Final    NO GROWTH 5 DAYS Performed at [facility]    Report Status 01/20/2015 FINAL  Final  Culture, blood (routine x 2)     Status: None   Collection Time: 01/13/15  7:20 PM  Result Value Ref Range Status   Specimen Description BLOOD RIGHT ARM  Final   Special Requests BOTTLES DRAWN AEROBIC AND ANAEROBIC 3CC  Final   Culture   Final    NO GROWTH 5 DAYS Performed at [facility]    Report Status 01/20/2015 FINAL  Final  Body fluid culture     Status: None   Collection Time: 01/14/15  9:02 AM  Result Value Ref Range Status   Specimen Description FLUID RIGHT SHOULDER SYNOVIAL  Final   Special Requests NONE  Final   Gram Stain   Final    FEW WBC PRESENT,BOTH PMN AND MONONUCLEAR NO ORGANISMS SEEN Performed at [facility]    Culture   Final    NO GROWTH 3 DAYS Performed at [facility]    Report Status 01/17/2015 FINAL  Final  Culture, blood (routine x 2)     Status: None   Collection Time: 01/18/15  6:10 PM  Result Value Ref Range Status   Specimen Description BLOOD LEFT ARM  Final   Special Requests BOTTLES DRAWN AEROBIC AND ANAEROBIC 10CC  Final   Culture   Final    NO GROWTH 5 DAYS Performed at [facility]    Report Status 01/25/2015 FINAL  Final  Culture, blood (routine x 2)     Status: None   Collection Time: 01/18/15  6:18 PM  Result Value Ref Range Status   Specimen Description BLOOD RIGHT ARM  Final   Special Requests BOTTLES DRAWN AEROBIC AND ANAEROBIC 10CC  Final    Culture   Final    NO GROWTH 5 DAYS Performed at [facility]    Report Status 01/25/2015 FINAL  Final  Surgical pcr screen     Status: None   Collection Time: 01/25/15  4:47 AM  Result Value Ref Range Status   MRSA, PCR NEGATIVE NEGATIVE Final   Staphylococcus aureus NEGATIVE NEGATIVE Final    Comment:        The Xpert SA Assay (FDA approved for NASAL specimens in patients over 58 years of age), is one component of a comprehensive surveillance program.  Test performance has been validated by [facility] for patients greater than or equal to 15 year old. It is not intended to diagnose infection nor to guide or monitor treatment.   Culture, blood (routine x 2)     Status: None (Preliminary result)   Collection Time: 02/02/15  3:53 PM  Result Value Ref Range Status   Specimen Description BLOOD PICC LINE  Final   Special Requests BOTTLES DRAWN AEROBIC AND ANAEROBIC 5ML  Final   Culture   Final           BLOOD CULTURE RECEIVED NO GROWTH TO DATE CULTURE WILL BE HELD FOR 5 DAYS BEFORE ISSUING A FINAL NEGATIVE REPORT Performed at [facility]    Report Status PENDING  Incomplete  Culture, blood (routine x 2)     Status: None (Preliminary result)   Collection Time: 02/02/15  4:32 PM  Result Value Ref Range Status   Specimen Description BLOOD PICC LINE  Final   Special Requests BOTTLES DRAWN AEROBIC AND ANAEROBIC 10CC  Final   Culture   Final           BLOOD CULTURE RECEIVED NO GROWTH TO DATE CULTURE WILL BE HELD FOR 5 DAYS BEFORE ISSUING A FINAL NEGATIVE REPORT Performed at [facility]    Report Status PENDING  Incomplete  MRSA PCR Screening     Status: None   Collection Time: 02/02/15  5:14 PM  Result Value Ref Range Status   MRSA by PCR NEGATIVE NEGATIVE Final    Comment:        The GeneXpert MRSA Assay (FDA approved for NASAL specimens only), is one component of a comprehensive MRSA colonization surveillance program. It is not intended to  diagnose MRSA infection nor to guide or monitor treatment for MRSA infections.   Body fluid culture     Status: None (Preliminary result)   Collection Time: 02/04/15  5:45 PM  Result Value Ref Range Status   Specimen Description SYNOVIAL FLUID SHOULDER RIGHT  Final   Special Requests NONE  Final   Gram Stain   Final    FEW WBC PRESENT, PREDOMINANTLY PMN NO ORGANISMS SEEN Performed at [facility]    Culture   Final    NO GROWTH 2 DAYS Performed at [facility]    Report Status PENDING  Incomplete  Surgical pcr screen     Status: None   Collection Time: 02/05/15  5:06 AM  Result  Value Ref Range Status   MRSA, PCR NEGATIVE NEGATIVE Final   Staphylococcus aureus NEGATIVE NEGATIVE Final    Comment:        The Xpert SA Assay (FDA approved for NASAL specimens in patients over 12 years of age), is one component of a comprehensive surveillance program.  Test performance has been validated by [facility] for patients greater than or equal to 61 year old. It is not intended to diagnose infection nor to guide or monitor treatment.     Studies/Results: Mr Herby Abraham Contrast  02/07/2015   CLINICAL DATA:  Initial evaluation for acute facial numbness. Status post recent debridement of glenohumeral joint. History of acute renal failure, hypertension, diabetes, septic shock, and endocarditis.  EXAM: MRI HEAD WITHOUT CONTRAST  TECHNIQUE: Multiplanar, multiecho pulse sequences of the brain and surrounding structures were obtained without intravenous contrast.  COMPARISON:  Prior study from 01/14/2015.  FINDINGS: Cerebral volume within normal limits for patient age. No significant small vessel changes. No mass lesion, midline shift, or mass effect. Ventricles are normal in size without hydrocephalus. No extra-axial fluid collection.  Again seen are several scattered subcentimeter foci of restricted diffusion involving the deep white matter of both cerebral hemispheres an the  centrum semi ovale. For reference purposes, the largest of these foci within the left cerebral hemisphere measures 6 mm (series 4, image 39). The largest of these foci in the right cerebral hemisphere measures approximately 4 mm (series 4, image 41). Subcentimeter focus measuring 3 mm in the left putamen/anterior limb of the left internal capsule noted. Several of these foci appear to be of slightly different age (the foci seen on in images 100 and 70 of series 4 appear to be slightly more acute for example). Again, susceptibility artifact seen on gradient echo sequence with many of these foci. Findings likely related to septic emboli given history of bacteremia and endocarditis. No associated mass effect. No frank parenchymal hemorrhage. No infratentorial infarcts.  Craniocervical junction normal. Pituitary gland within normal limits. No acute abnormality seen about the orbits.  Paranasal sinuses and mastoid air cells are clear.  Bone marrow signal intensity normal. No acute abnormality seen within the scalp soft tissues.  IMPRESSION: Multi focal subcentimeter acute to subacute ischemic infarcts as detailed above. Again, these findings are likely related to central thromboembolic disease/septic emboli given history of bacteremia and endocarditis. While several of these foci appear to be of slightly different ages, these are new relative to prior MRI from 01/14/2015, where the previously seen infarcts have resolved/normalized. No significant mass effect or parenchymal hemorrhage. Overall, the number of infarcts is decreased from prior study.   Electronically Signed   By: Jeannine Boga M.D.   On: 02/07/2015 06:02   US Scrotum  02/05/2015   CLINICAL DATA:  Right testicular pain and swelling for 1 day  EXAM: SCROTAL ULTRASOUND  DOPPLER ULTRASOUND OF THE TESTICLES  TECHNIQUE: Complete ultrasound examination of the testicles, epididymis, and other scrotal structures was performed. Color and spectral Doppler  ultrasound were also utilized to evaluate blood flow to the testicles.  COMPARISON:  None.  FINDINGS: Right testicle  Measurements: 4.4 x 2.9 x 2.4 cm. No mass or microlithiasis visualized.  Left testicle  Measurements: 4.5 x 2.6 x 2.6 cm. No mass or microlithiasis visualized.  Subjectively minimally prominent internal color flow within both testes.  Right epididymis:  Normal in size and appearance.  Left epididymis:  Normal in size and appearance.  Hydrocele:  None visualized.  Varicocele:  None visualized.  Pulsed Doppler interrogation of both testes demonstrates normal low resistance arterial and venous waveforms bilaterally.  IMPRESSION: No sonographic evidence for intratesticular mass or testicular torsion.  Subjectively minimally prominent internal color flow within both testes which may be a normal variant although orchitis could appear similar.   Electronically Signed   By: Conchita Paris M.D.   On: 02/05/2015 21:56   [place] Art/ven Flow Abd Pelv Doppler  02/05/2015   CLINICAL DATA:  Right testicular pain and swelling for 1 day  EXAM: SCROTAL ULTRASOUND  DOPPLER ULTRASOUND OF THE TESTICLES  TECHNIQUE: Complete ultrasound examination of the testicles, epididymis, and other scrotal structures was performed. Color and spectral Doppler ultrasound were also utilized to evaluate blood flow to the testicles.  COMPARISON:  None.  FINDINGS: Right testicle  Measurements: 4.4 x 2.9 x 2.4 cm. No mass or microlithiasis visualized.  Left testicle  Measurements: 4.5 x 2.6 x 2.6 cm. No mass or microlithiasis visualized.  Subjectively minimally prominent internal color flow within both testes.  Right epididymis:  Normal in size and appearance.  Left epididymis:  Normal in size and appearance.  Hydrocele:  None visualized.  Varicocele:  None visualized.  Pulsed Doppler interrogation of both testes demonstrates normal low resistance arterial and venous waveforms bilaterally.  IMPRESSION: No sonographic evidence for  intratesticular mass or testicular torsion.  Subjectively minimally prominent internal color flow within both testes which may be a normal variant although orchitis could appear similar.   Electronically Signed   By: Conchita Paris M.D.   On: 02/05/2015 21:56      Assessment/Plan:  Principal Problem:   Acute diastolic CHF (congestive heart failure), NYHA class 3 Active Problems:   Diabetes type 2, uncontrolled   Uncontrolled hypertension   Acute renal failure   Severe mitral regurgitation   Endocarditis   Anasarca   Arthritis, septic, shoulder   Chronic renal insufficiency, stage III (moderate)   Acute renal failure superimposed on stage 3 chronic kidney disease   Bacterial endocarditis    Joaquin Knebel is a 35 y.o. 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 Sharol Given performing I and D of shoulder which should help [place] control this infection. Again I worry that he needs valve surgery more urgently now        LOS: 5 days   Alcide Evener 02/07/2015, 1:50 PM

## 2015-02-07 NOTE — Progress Notes (Signed)
[address]       [name] [address]             [phone]     CARDIOTHORACIC SURGERY PROGRESS NOTE  2 Days Post-Op  S/P Procedure(s) (LRB): ARTHROSCOPY SHOULDER (Right)  Subjective: Mild shoulder pain.  Denies SOB.  Not very communicative.  Objective: Vital signs in last 24 hours: Temp:  [98.7 F (37.1 C)-98.9 F (37.2 C)] 98.9 F (37.2 C) (02/07 2059) Pulse Rate:  [83-86] 86 (02/08 0530) Cardiac Rhythm:  [-] Normal sinus rhythm (02/08 0400) Resp:  [16-18] 18 (02/08 0530) BP: (155-206)/(72-121) 179/72 mmHg (02/08 0530) SpO2:  [96 %-100 %] 96 % (02/08 0530) Weight:  [119.5 kg (263 lb 7.2 oz)] 119.5 kg (263 lb 7.2 oz) (02/08 0530)  Physical Exam:  Rhythm:   sinus  Breath sounds: clear  Heart sounds:  RRR w/ systolic murmur  Incisions:  Dressings intact  Abdomen:  Soft, non-distended, non-tender  Extremities:  Warm, well-perfused, severe bilateral LE edema   Intake/Output from previous day: 02/07 0701 - 02/08 0700 In: 720 [P.O.:720] Out: 1725 [Urine:1725] Intake/Output this shift:    Lab Results:  Recent Labs  02/05/15 0500 02/06/15 0500  WBC 4.7 6.3  HGB 8.1* 8.2*  HCT 25.3* 25.3*  PLT 318 330   BMET:  Recent Labs  02/06/15 0500 02/07/15 0543  NA 137 138  K 3.7 3.7  CL 104 105  CO2 30 29  GLUCOSE 159* 154*  BUN 15 18  CREATININE 1.67* 1.80*  CALCIUM 7.9* 8.3*    CBG (last 3)   Recent Labs  02/06/15 1622 02/06/15 2034 02/07/15 0750  GLUCAP 165* 197* 123*   PT/INR:  No results for input(s): LABPROT, INR in the last 72 hours.  CXR:  N/A  MRI HEAD WITHOUT CONTRAST  TECHNIQUE: Multiplanar, multiecho pulse sequences of the brain and surrounding structures were obtained without intravenous contrast.  COMPARISON: Prior study from 01/14/2015.  FINDINGS: Cerebral volume within normal limits for patient age. No significant small vessel changes. No mass lesion, midline shift, or mass effect. Ventricles are  normal in size without hydrocephalus. No extra-axial fluid collection.  Again seen are several scattered subcentimeter foci of restricted diffusion involving the deep white matter of both cerebral hemispheres an the centrum semi ovale. For reference purposes, the largest of these foci within the left cerebral hemisphere measures 6 mm (series 4, image 39). The largest of these foci in the right cerebral hemisphere measures approximately 4 mm (series 4, image 41). Subcentimeter focus measuring 3 mm in the left putamen/anterior limb of the left internal capsule noted. Several of these foci appear to be of slightly different age (the foci seen on in images 27 and 67 of series 4 appear to be slightly more acute for example). Again, susceptibility artifact seen on gradient echo sequence with many of these foci. Findings likely related to septic emboli given history of bacteremia and endocarditis. No associated mass effect. No frank parenchymal hemorrhage. No infratentorial infarcts.  Craniocervical junction normal. Pituitary gland within normal limits. No acute abnormality seen about the orbits.  Paranasal sinuses and mastoid air cells are clear.  Bone marrow signal intensity normal. No acute abnormality seen within the scalp soft tissues.  IMPRESSION: Multi focal subcentimeter acute to subacute ischemic infarcts as detailed above. Again, these findings are likely related to central thromboembolic disease/septic emboli given history of bacteremia and endocarditis. While several of these foci appear to be of slightly different  ages, these are new relative to prior MRI from 01/14/2015, where the previously seen infarcts have resolved/normalized. No significant mass effect or parenchymal hemorrhage. Overall, the number of infarcts is decreased from prior study.   Electronically Signed  By: Rise MuBenjamin McClintock M.D.  On: 02/07/2015 06:02  Assessment/Plan: S/P Procedure(s)  (LRB): ARTHROSCOPY SHOULDER (Right)  Progress noted.  Now s/p arthroscopic debridement and subacromial decompression for septic right shoulder.  Follow up MRI results noted.  Blood cultures remain no growth so far.  Blood pressure still relatively high.  I/O's negative last few days but no improvement in edema and weight still nearly 10 kg above his weight from 2 weeks ago.  He may need more aggressive diuretic therapy but given renal dysfunction he will need to be watched carefully.    Ultimately he will need mitral valve repair/replacement during this hospitalization, but his condition remains suboptimal at present and there are no indications for urgent surgical intervention under suboptimal conditions.   I spent in excess of 15 minutes during the conduct of this hospital encounter and >50% of this time involved direct face-to-face encounter with the patient for counseling and/or coordination of their care.              Taimi Towe H 02/07/2015 8:57 AM

## 2015-02-08 DIAGNOSIS — M4620 Osteomyelitis of vertebra, site unspecified: Secondary | ICD-10-CM | POA: Diagnosis present

## 2015-02-08 DIAGNOSIS — N189 Chronic kidney disease, unspecified: Secondary | ICD-10-CM

## 2015-02-08 DIAGNOSIS — I5033 Acute on chronic diastolic (congestive) heart failure: Secondary | ICD-10-CM | POA: Insufficient documentation

## 2015-02-08 DIAGNOSIS — R200 Anesthesia of skin: Secondary | ICD-10-CM | POA: Insufficient documentation

## 2015-02-08 LAB — GLUCOSE, CAPILLARY
GLUCOSE-CAPILLARY: 155 mg/dL — AB (ref 70–99)
GLUCOSE-CAPILLARY: 199 mg/dL — AB (ref 70–99)
GLUCOSE-CAPILLARY: 241 mg/dL — AB (ref 70–99)
GLUCOSE-CAPILLARY: 244 mg/dL — AB (ref 70–99)
Glucose-Capillary: 176 mg/dL — ABNORMAL HIGH (ref 70–99)
Glucose-Capillary: 205 mg/dL — ABNORMAL HIGH (ref 70–99)

## 2015-02-08 LAB — CULTURE, BLOOD (ROUTINE X 2)
CULTURE: NO GROWTH
Culture: NO GROWTH

## 2015-02-08 LAB — BODY FLUID CULTURE: CULTURE: NO GROWTH

## 2015-02-08 LAB — BASIC METABOLIC PANEL
ANION GAP: 9 (ref 5–15)
BUN: 18 mg/dL (ref 6–23)
CO2: 27 mmol/L (ref 19–32)
Calcium: 8.2 mg/dL — ABNORMAL LOW (ref 8.4–10.5)
Chloride: 102 mmol/L (ref 96–112)
Creatinine, Ser: 1.67 mg/dL — ABNORMAL HIGH (ref 0.50–1.35)
GFR calc Af Amer: 60 mL/min — ABNORMAL LOW (ref >90)
GFR, EST NON AFRICAN AMERICAN: 52 mL/min — AB (ref >90)
Glucose, Bld: 182 mg/dL — ABNORMAL HIGH (ref 70–99)
Potassium: 3.3 mmol/L — ABNORMAL LOW (ref 3.5–5.1)
Sodium: 138 mmol/L (ref 135–145)

## 2015-02-08 LAB — MAGNESIUM: MAGNESIUM: 1.9 mg/dL (ref 1.5–2.5)

## 2015-02-08 MED ORDER — CARVEDILOL 25 MG PO TABS: 37.5000 mg | ORAL_TABLET | Freq: Two times a day (BID) | ORAL | Status: DC

## 2015-02-08 MED ORDER — POTASSIUM CHLORIDE ER 10 MEQ PO TBCR
20.0000 meq | EXTENDED_RELEASE_TABLET | Freq: Three times a day (TID) | ORAL | Status: AC
  Administered 2015-02-08 (×3): 20 meq via ORAL
  Filled 2015-02-08 (×6): qty 2

## 2015-02-08 MED ORDER — ISOSORBIDE MONONITRATE ER 60 MG PO TB24
60.0000 mg | ORAL_TABLET | Freq: Two times a day (BID) | ORAL | Status: DC
  Administered 2015-02-08 – 2015-02-14 (×12): 60 mg via ORAL
  Filled 2015-02-08 (×12): qty 1

## 2015-02-08 MED ORDER — CARVEDILOL 25 MG PO TABS
50.0000 mg | ORAL_TABLET | Freq: Two times a day (BID) | ORAL | Status: DC
  Administered 2015-02-08 – 2015-02-17 (×19): 50 mg via ORAL
  Filled 2015-02-08 (×8): qty 2
  Filled 2015-02-08 (×2): qty 4
  Filled 2015-02-08 (×2): qty 2
  Filled 2015-02-08: qty 4
  Filled 2015-02-08 (×3): qty 2
  Filled 2015-02-08: qty 4
  Filled 2015-02-08 (×2): qty 2
  Filled 2015-02-08: qty 4
  Filled 2015-02-08: qty 2

## 2015-02-08 MED ORDER — METOLAZONE 5 MG PO TABS
2.5000 mg | ORAL_TABLET | Freq: Every day | ORAL | Status: DC
  Administered 2015-02-08: 2.5 mg via ORAL
  Filled 2015-02-08: qty 0.5

## 2015-02-08 NOTE — Clinical Documentation Improvement (Signed)
  Noted patient had Extensive debridement of the glenohumeral joint. Subacromial decompression  Please indicate type of debridement for accurate coding purposes.  . Depth: --Skin --Subcutaneous tissue/fascia --Muscle --Joint --Bone  . Type: --Excisional/Sharp --Non-excisional  . Specify the type of instrument used  Supporting Information:The shaver was inserted through the anterior portal and patient underwent extensive debridement with synovitis involving the entire rotator cuff this was debrided there were loose bodies there was a SLAP lesion and there was tearing of the biceps tendon these were all debrided. The articular cartilage was intact. After extensive debridement within the shoulder joint instance we moved the scope was then inserted from the posterior portal and the subacromial space and a new lateral portal was established. Patient had extensive synovitis within the subacromial space as well. Patient underwent subacromial decompression and further debridement.  Thank You,  Elpidio AnisGarnet Lavoris Canizales, RN, BSN, CDI [phone] [facility]  HIM

## 2015-02-08 NOTE — Progress Notes (Signed)
Patient Name: Dwayne [name] Date of Encounter: 02/08/2015  Principal Problem:   Acute diastolic CHF (congestive heart failure), NYHA class 3 Active Problems:   Diabetes type 2, uncontrolled   Uncontrolled hypertension   Acute renal failure   Severe mitral regurgitation   Endocarditis   Anasarca   Arthritis, septic, shoulder   Chronic renal insufficiency, stage III (moderate)   Acute renal failure superimposed on stage 3 chronic kidney disease   Bacterial endocarditis   Edema   HTN (hypertension)   Length of Stay: 6  SUBJECTIVE  As always, he is sullen and not very communicative, but appears to be feeling well. Finished all the food on his plate and has a good appetite. Walk the halls without much in the way of dyspnea. Sleeps upright because of shoulder pain Afebrile Today is day 30 of antibiotics Despite increased dose of diuretics and a 4.4 L urine output last 24 hours, his weight remains stable me at 119 kg, roughly 10 kg higher than his discharge weight during previous hospitalization. His blood pressure remains elevated despite increased dose of hydralazine  CURRENT MEDS . ampicillin (OMNIPEN) IV  2 g Intravenous Q4H  . carvedilol  37.5 mg Oral BID WC  . docusate sodium  100 mg Oral BID  . feeding supplement (ENSURE COMPLETE)  237 mL Oral TID WC  . furosemide  120 mg Intravenous BID  . hydrALAZINE  150 mg Oral 3 times per day  . Influenza vac split quadrivalent PF  0.5 mL Intramuscular Tomorrow-1000  . insulin aspart  0-9 Units Subcutaneous TID WC  . isosorbide mononitrate  60 mg Oral BID  . metolazone  2.5 mg Oral Daily  . sodium chloride  3 mL Intravenous Q12H    OBJECTIVE   Intake/Output Summary (Last 24 hours) at 02/08/15 0905 Last data filed at 02/08/15 0800  Gross per 24 hour  Intake      0 ml  Output   4175 ml  Net  -4175 ml   Filed Weights   02/06/15 0530 02/07/15 0530 02/08/15 0400  Weight: 119.296 kg (263 lb) 119.5 kg (263 lb 7.2 oz) 119.3  kg (263 lb 0.1 oz)    PHYSICAL EXAM Filed Vitals:   02/07/15 2141 02/08/15 0033 02/08/15 0400 02/08/15 0810  BP: 158/85 173/108 165/73 213/95  Pulse: 88 99 82 90  Temp: 98.8 F (37.1 C) 99.3 F (37.4 C) 98.8 F (37.1 C) 98.4 F (36.9 C)  TempSrc: Oral Oral Oral Oral  Resp: Height:      Weight:   119.3 kg (263 lb 0.1 oz)   SpO2: 100% 100% 98% 99%   General: Alert, oriented x3, no distress Head: no evidence of trauma, PERRL, EOMI, no exophtalmos or lid lag, no myxedema, no xanthelasma; normal ears, nose and oropharynx Neck: normal jugular venous pulsations and no hepatojugular reflux; brisk carotid pulses without delay and no carotid bruits Chest: clear to auscultation, no signs of consolidation by percussion or palpation, normal fremitus, symmetrical and full respiratory excursions Cardiovascular: normal position and quality of the apical impulse, regular rhythm, normal first and second heart sounds, no rubs or gallops, 2/6 holosystolic apical murmur, no diastolic murmur Abdomen: no tenderness or distention, no masses by palpation, no abnormal pulsatility or arterial bruits, normal bowel sounds, no hepatosplenomegaly Extremities: no clubbing, cyanosis or edema; 2+ radial, ulnar and brachial pulses bilaterally; 2+ right femoral, posterior tibial and dorsalis pedis pulses; 2+ left femoral, posterior tibial and dorsalis pedis pulses;  no subclavian or femoral bruits Neurological: grossly nonfocal  LABS  CBC  Recent Labs  02/06/15 0500  WBC 6.3  HGB 8.2*  HCT 25.3*  MCV 86.1  PLT 330   Basic Metabolic Panel  Recent Labs  02/07/15 0543 02/08/15 0500  NA 138 138  K 3.7 3.3*  CL 105 102  CO2 29 27  GLUCOSE 154* 182*  BUN 18 18  CREATININE 1.80* 1.67*  CALCIUM 8.3* 8.2*  MG 1.9 1.9    Radiology Studies Imaging results have been reviewed and Mr Brain Wo Contrast  02/07/2015   CLINICAL DATA:  Initial evaluation for acute facial numbness. Status post recent  debridement of glenohumeral joint. History of acute renal failure, hypertension, diabetes, septic shock, and endocarditis.  EXAM: MRI HEAD WITHOUT CONTRAST  TECHNIQUE: Multiplanar, multiecho pulse sequences of the brain and surrounding structures were obtained without intravenous contrast.  COMPARISON:  Prior study from 01/14/2015.  FINDINGS: Cerebral volume within normal limits for patient age. No significant small vessel changes. No mass lesion, midline shift, or mass effect. Ventricles are normal in size without hydrocephalus. No extra-axial fluid collection.  Again seen are several scattered subcentimeter foci of restricted diffusion involving the deep white matter of both cerebral hemispheres an the centrum semi ovale. For reference purposes, the largest of these foci within the left cerebral hemisphere measures 6 mm (series 4, image 39). The largest of these foci in the right cerebral hemisphere measures approximately 4 mm (series 4, image 41). Subcentimeter focus measuring 3 mm in the left putamen/anterior limb of the left internal capsule noted. Several of these foci appear to be of slightly different age (the foci seen on in images 51 and 66 of series 4 appear to be slightly more acute for example). Again, susceptibility artifact seen on gradient echo sequence with many of these foci. Findings likely related to septic emboli given history of bacteremia and endocarditis. No associated mass effect. No frank parenchymal hemorrhage. No infratentorial infarcts.  Craniocervical junction normal. Pituitary gland within normal limits. No acute abnormality seen about the orbits.  Paranasal sinuses and mastoid air cells are clear.  Bone marrow signal intensity normal. No acute abnormality seen within the scalp soft tissues.  IMPRESSION: Multi focal subcentimeter acute to subacute ischemic infarcts as detailed above. Again, these findings are likely related to central thromboembolic disease/septic emboli given history  of bacteremia and endocarditis. While several of these foci appear to be of slightly different ages, these are new relative to prior MRI from 01/14/2015, where the previously seen infarcts have resolved/normalized. No significant mass effect or parenchymal hemorrhage. Overall, the number of infarcts is decreased from prior study.   Electronically Signed   By: Rise Mu M.D.   On: 02/07/2015 06:02    TELE NSR   ASSESSMENT AND PLAN  1. acute on chronic systolic heart failure due to severe mitral insufficiency- still with signs of hypervolemia despite good urine output, add one dose of metolazone. Replace potassium 2. Severe mitral insufficiency due to endocarditis related valve perforation - the plan is to perform surgery on this admission, but he needs optimization of his hemodynamic status first 3. staphylococcal endocarditis, also complicated by CNS embolism and secondary foci of infection in his shoulder and spine. Secondary foci of infection have been addressed. Has had 30 days of intravenous antibiotics. Last blood cultures were sterile. Plan surgery on this admission, hopefully early next week. Due to his young age he will need a mechanical mitral valve if the valve  cannot be repaired surgically. 4. Severe hypertension - avoiding RAAS inhibitors due to volatile renal function, increased dose of nitrates and carvedilol 5. Acute on chronic renal insufficiency - improving  Thurmon FairMihai Ciarah Peace, MD, [facility] [phone] office [phone] pager 02/08/2015 9:05 AM

## 2015-02-08 NOTE — Progress Notes (Signed)
PROGRESS NOTE  Dwayne [name] WUJ:[id number] DOB: 09-Oct-1980 DOA: 02/02/2015 PCP: No PCP Per Patient  HPI/Recap of past 24 hours: s/p right shoulder washout. Post op day 3.  Scrotal US unremarkable, slightly less scrotal edema on exam; but still with signs of fluid overload. BP still elevated Denies CP, SOB and fever. Expressed difficulty chewing.  Assessment/Plan: Principal Problem:   Acute diastolic CHF (congestive heart failure), NYHA class 3 Active Problems:   Diabetes type 2, uncontrolled   Uncontrolled hypertension   Acute renal failure   Severe mitral regurgitation   Endocarditis   Anasarca   Arthritis, septic, shoulder   Chronic renal insufficiency, stage III (moderate)   Acute renal failure superimposed on stage 3 chronic kidney disease   Bacterial endocarditis   Edema   HTN (hypertension)  1. Acute on chronic diastolic dysfunction EF 60%. 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 Also with diet and meds non-compliance. -continue IV laxis and dose of metolazone today (2/9) -continue coreg/imdur/hydralazine, cardiology following.  -low sodium diet, daily weights and strict I's and O's  2. Endocarditis: Continue IV ampicillin dosed by pharmacy for now. Infectious disease consulted; will follow rec's. Has left arm PICC line. Thoracic surgery consulted and tentatively plan is for surgery on 02/14/15.  3. Right shoulder septic arthritis.  -Mri shoulder on 2/5 concerning for septic arthritis/osteo? [facility] Dr. Lajoyce Corners consulted. -2/6 patient was brought to the OR and had: Right shoulder ARTHROSCOPY SHOULDER Extensive debridement of the glenohumeral joint. Subacromial decompression.  -Orthopedic service to help with documentation of depth, type and instrumentation used.  4. Septic embolic infarcts: due to c/o facial numbness on 2/7, mri brain repeated, results showed: While several of these foci appear to be of slightly different ages, these are new  relative to prior MRI from 01/14/2015, where the previously seen infarcts have resolved/normalized. No significant mass effect or parenchymal hemorrhage.  -continue IV antibiotics and hopefully surgery to fix his MV soon  5. Dental caries. Multiple teeth removed one week ago prior to this admission. -Still requiring prn pain meds and unable to chew -will switch to dysphagia 3 diet  6. DM type II. Last A1c was above 8.  -will adjust as needed -continue 70/30 and SSI   7. Essential hypertension.  Remains Uncontrolled.  -following cardiology rec's will increase coreg to  BID and increase hydralazine -will also continue imdur -low sodium diet encourage  8. Chronic kidney disease stage IV. Baseline creatinine around 2. Currently at or better than baseline. -will monitor while on IV diuretics   Code Status: full  Family Communication: patient and wife at bedside  Disposition Plan: remain inpatient   Consultants:  Cardiology/cardiothoracic surgery/infectious disease/ortho  Procedures: 2/6 Right shoulder joint ARTHROSCOPY SHOULDER  Extensive debridement of the glenohumeral joint. Subacromial decompression.  Antibiotics:  ampicillin   Objective: BP 177/106 mmHg  Pulse 84  Temp(Src) 98.5 F (36.9 C) (Oral)  Resp 12  Ht  (1.88 m)  Wt 115.758 kg (255 lb 3.2 oz)  BMI 32.75 kg/m2  SpO2 100%  Intake/Output Summary (Last 24 hours) at 02/08/15 1228 Last data filed at 02/08/15 1203  Gross per 24 hour  Intake      0 ml  Output   4675 ml  Net  -4675 ml   Filed Weights   02/07/15 0530 02/08/15 0400 02/08/15 1040  Weight: 119.5 kg (263 lb 7.2 oz) 119.3 kg (263 lb 0.1 oz) 115.758 kg (255 lb 3.2 oz)    Exam:  General:  Aaox3, NAD; flat affect but able to answer questions and communicate properly  Cardiovascular: RRR, soft systolic murmur appreciated on exam, no gallops  Respiratory: decreased at bases, no wheezing and no rhonchi  Abdomen: soft/NT/ND,  positive bowel sounds  Musculoskeletal: pitting edema bilateral, right shoulder dressing intact/decrease range of motion and pain.  Data Reviewed: Basic Metabolic Panel:  Recent Labs Lab 02/03/15 0550 02/04/15 0545 02/05/15 0500 02/06/15 0500 02/07/15 0543 02/08/15 0500  NA 139 141  --  137 138 138  K 3.5 3.6  --  3.7 3.7 3.3*  CL 107 106  --  104 105 102  CO2 28 27  --  30 29 27   GLUCOSE 126* 142*  --  159* 154* 182*  BUN 13 16  --  15 18 18   CREATININE 1.78* 1.81*  --  1.67* 1.80* 1.67*  CALCIUM 7.8* 7.8*  --  7.9* 8.3* 8.2*  MG  --  1.9 1.8 1.8 1.9 1.9   Liver Function Tests:  Recent Labs Lab 02/02/15 1035  AST 18  ALT 20  ALKPHOS 112  BILITOT 0.3  PROT 6.5  ALBUMIN 1.6*   CBC:  Recent Labs Lab 02/02/15 1035 02/03/15 0550 02/04/15 0545 02/05/15 0500 02/06/15 0500  WBC 6.3 5.8 4.6 4.7 6.3  NEUTROABS 4.2  --   --   --   --   HGB 9.0* 8.2* 8.1* 8.1* 8.2*  HCT 27.2* 24.9* 25.2* 25.3* 25.3*  MCV 86.1 85.0 87.5 85.8 86.1  PLT 316 285 292 318 330   BNP (last 3 results)  Recent Labs  01/17/15 0535 01/22/15 0347 02/02/15 1035  BNP 315.1* 218.9* 629.0*   CBG:  Recent Labs Lab 02/06/15 1115 02/06/15 1622 02/06/15 2034 02/07/15 0750 02/08/15 1129  GLUCAP 249* 165* 197* 123* 241*    Recent Results (from the past 240 hour(s))  Culture, blood (routine x 2)     Status: None   Collection Time: 02/02/15  3:53 PM  Result Value Ref Range Status   Specimen Description BLOOD PICC LINE  Final   Special Requests BOTTLES DRAWN AEROBIC AND ANAEROBIC 5ML  Final   Culture   Final    NO GROWTH 5 DAYS [facility]    Report Status 02/08/2015 FINAL  Final  Culture, blood (routine x 2)     Status: None   Collection Time: 02/02/15  4:32 PM  Result Value Ref Range Status   Specimen Description BLOOD PICC LINE  Final   Special Requests BOTTLES DRAWN AEROBIC AND ANAEROBIC 10CC  Final   Culture   Final    NO GROWTH 5 DAYS Performed at  [facility]    Report Status 02/08/2015 FINAL  Final  MRSA PCR Screening     Status: None   Collection Time: 02/02/15  5:14 PM  Result Value Ref Range Status   MRSA by PCR NEGATIVE NEGATIVE Final    Comment:        The GeneXpert MRSA Assay (FDA approved for NASAL specimens only), is one component of a comprehensive MRSA colonization surveillance program. It is not intended to diagnose MRSA infection nor to guide or monitor treatment for MRSA infections.   Body fluid culture     Status: None (Preliminary result)   Collection Time: 02/04/15  5:45 PM  Result Value Ref Range Status   Specimen Description SYNOVIAL FLUID SHOULDER RIGHT  Final   Special Requests NONE  Final   Gram Stain   Final  FEW WBC PRESENT, PREDOMINANTLY PMN NO ORGANISMS SEEN Performed at [facility]    Culture   Final    NO GROWTH 2 DAYS Performed at [facility]    Report Status PENDING  Incomplete  Surgical pcr screen     Status: None   Collection Time: 02/05/15  5:06 AM  Result Value Ref Range Status   MRSA, PCR NEGATIVE NEGATIVE Final   Staphylococcus aureus NEGATIVE NEGATIVE Final    Comment:        The Xpert SA Assay (FDA approved for NASAL specimens in patients over 46 years of age), is one component of a comprehensive surveillance program.  Test performance has been validated by [facility] for patients greater than or equal to 31 year old. It is not intended to diagnose infection nor to guide or monitor treatment.      Studies: Mr Brain Wo Contrast  02/07/2015   CLINICAL DATA:  Initial evaluation for acute facial numbness. Status post recent debridement of glenohumeral joint. History of acute renal failure, hypertension, diabetes, septic shock, and endocarditis.  EXAM: MRI HEAD WITHOUT CONTRAST  TECHNIQUE: Multiplanar, multiecho pulse sequences of the brain and surrounding structures were obtained without intravenous contrast.  COMPARISON:  Prior study from  01/14/2015.  FINDINGS: Cerebral volume within normal limits for patient age. No significant small vessel changes. No mass lesion, midline shift, or mass effect. Ventricles are normal in size without hydrocephalus. No extra-axial fluid collection.  Again seen are several scattered subcentimeter foci of restricted diffusion involving the deep white matter of both cerebral hemispheres an the centrum semi ovale. For reference purposes, the largest of these foci within the left cerebral hemisphere measures 6 mm (series 4, image 39). The largest of these foci in the right cerebral hemisphere measures approximately 4 mm (series 4, image 41). Subcentimeter focus measuring 3 mm in the left putamen/anterior limb of the left internal capsule noted. Several of these foci appear to be of slightly different age (the foci seen on in images 41 and 108 of series 4 appear to be slightly more acute for example). Again, susceptibility artifact seen on gradient echo sequence with many of these foci. Findings likely related to septic emboli given history of bacteremia and endocarditis. No associated mass effect. No frank parenchymal hemorrhage. No infratentorial infarcts.  Craniocervical junction normal. Pituitary gland within normal limits. No acute abnormality seen about the orbits.  Paranasal sinuses and mastoid air cells are clear.  Bone marrow signal intensity normal. No acute abnormality seen within the scalp soft tissues.  IMPRESSION: Multi focal subcentimeter acute to subacute ischemic infarcts as detailed above. Again, these findings are likely related to central thromboembolic disease/septic emboli given history of bacteremia and endocarditis. While several of these foci appear to be of slightly different ages, these are new relative to prior MRI from 01/14/2015, where the previously seen infarcts have resolved/normalized. No significant mass effect or parenchymal hemorrhage. Overall, the number of infarcts is decreased from  prior study.   Electronically Signed   By: Rise Mu M.D.   On: 02/07/2015 06:02    Scheduled Meds: . ampicillin (OMNIPEN) IV  2 g Intravenous Q4H  . carvedilol  50 mg Oral BID WC  . docusate sodium  100 mg Oral BID  . feeding supplement (ENSURE COMPLETE)  237 mL Oral TID WC  . furosemide  120 mg Intravenous BID  . hydrALAZINE  150 mg Oral 3 times per day  . Influenza vac split quadrivalent PF  0.5 mL Intramuscular Tomorrow-1000  . insulin aspart  0-9 Units Subcutaneous TID WC  . isosorbide mononitrate  60 mg Oral BID  . metolazone  2.5 mg Oral Daily  . potassium chloride  20 mEq Oral TID  . sodium chloride  3 mL Intravenous Q12H    Continuous Infusions: . sodium chloride 10 mL/hr at 02/07/15 0132    Time: 30 minutes   Vassie Loll  [facility] Pager [phone]. If 7PM-7AM, please contact night-coverage at [email or web address], password [facility] 02/08/2015, 12:28 PM  LOS: 6 days

## 2015-02-08 NOTE — Progress Notes (Signed)
Occupational Therapy Treatment Patient Details Name: Dwayne [name] MRN: [id number] DOB: 01/09/1980 Today's Date: 02/08/2015    History of present illness Dwayne [name], with recent diagnosis of mitral valve endocarditis due to poor dentition with MSSA bacteremia, chronic kidney disease stage III Baseline creatinine around 2.2, recent septic right shoulder arthritis, pericardial effusion, DM type II, diastolic dysfunction EF 60%, who received a left arm PICC line and was on IV ampicillin, he had multiple infected teeth removed about one week ago. He was planned to undergo open heart surgery for valve replacement in 4-6 weeks. admitted with chest pain, anasarca, scrotal edema, and R shoulder septic arthritis; Underwent R shoulder I& D and subacromial decompression on 2/6   OT comments  Session focused on Rt UE exercises. Continue to recommend HHOT.  Follow Up Recommendations  Home health OT;Supervision/Assistance - 24 hour    Equipment Recommendations  Tub/shower seat    Recommendations for Other Services      Precautions / Restrictions Precautions Precautions: Fall;Other (comment) (scrotal edema) Restrictions Weight Bearing Restrictions: No       Mobility Bed Mobility Overal bed mobility: Modified Independent         Sit to supine: Modified independent (Device/Increase time)      Transfers Overall transfer level: Needs assistance   Transfers: Sit to/from Stand Sit to Stand: Supervision                  ADL Overall ADL's : Needs assistance/impaired                         Toilet Transfer: Supervision/safety;Ambulation (few steps; bed)           Functional mobility during ADLs: Supervision/safety General ADL Comments: explained positioning of pillows that may be more comfortable.       Vision                     Perception     Praxis      Cognition  Awake/Alert Behavior During Therapy: WFL for tasks  assessed/performed Overall Cognitive Status: Within Functional Limits for tasks assessed                       Extremity/Trunk Assessment               Exercises Other Exercises Other Exercises: Rt elbow flexion/extension, wrist flexion/extension, and digit composite flexion/extension; explained supination/pronation to pt Other Exercises: Pendulums with Rt UE Other Exercises: AAROM Rt shoulder flexion/extension and AAROM external/internal rotation Positioning of UE while sleeping:  (educated)   Shoulder Instructions Shoulder Instructions Positioning of UE while sleeping:  (educated)     General Comments      Pertinent Vitals/ Pain       Pain Assessment: 0-10 Pain Score: 8  Pain Location: Rt shoulder Pain Intervention(s): Limited activity within patient's tolerance;Monitored during session  Home Living                                          Prior Functioning/Environment              Frequency Min 3X/week     Progress Toward Goals  OT Goals(current goals can now be found in the care plan section)  Progress towards OT goals: Progressing toward goals  Acute Rehab OT Goals  Patient Stated Goal: not stated OT Goal Formulation: With patient Time For Goal Achievement: 02/21/15 Potential to Achieve Goals: Good ADL Goals Pt Will Perform Upper Body Bathing: with supervision;with caregiver independent in assisting;sitting Pt Will Perform Upper Body Dressing: with supervision;sitting;with caregiver independent in assisting Pt Will Transfer to Toilet: with modified independence;ambulating Pt/caregiver will Perform Home Exercise Program: Right Upper extremity;Increased ROM;With Supervision;With written HEP provided Additional ADL Goal #1: Pt will demonstrate  increase in shoulder flexion to 90 supine - AAROM; ER to 30 with dowel/AAROM  Plan Discharge plan remains appropriate    Co-evaluation                 End of Session      Activity Tolerance Patient limited by pain   Patient Left in bed;with call bell/phone within reach;with family/visitor present   Nurse Communication          Time: 1610-9604 OT Time Calculation (min): 19 min  Charges: OT General Charges $OT Visit: 1 Procedure OT Treatments $Therapeutic Exercise: 8-22 mins  Earlie Raveling OTR/L [phone] 02/08/2015, 4:20 PM

## 2015-02-08 NOTE — Progress Notes (Signed)
[place] for Infectious Disease    Subjective:   Complaining of some shoulder pain   Antibiotics:  Anti-infectives    Start     Dose/Rate Route Frequency Ordered Stop   02/02/15 1700  ampicillin (OMNIPEN) 2 g in sodium chloride 0.9 % 50 mL IVPB     2 g150 mL/hr over 20 Minutes Intravenous Every 4 hours 02/02/15 1638        Medications: Scheduled Meds: . ampicillin (OMNIPEN) IV  2 g Intravenous Q4H  . carvedilol  50 mg Oral BID WC  . docusate sodium  100 mg Oral BID  . feeding supplement (ENSURE COMPLETE)  237 mL Oral TID WC  . furosemide  120 mg Intravenous BID  . hydrALAZINE  150 mg Oral 3 times per day  . Influenza vac split quadrivalent PF  0.5 mL Intramuscular Tomorrow-1000  . insulin aspart  0-9 Units Subcutaneous TID WC  . isosorbide mononitrate  60 mg Oral BID  . metolazone  2.5 mg Oral Daily  . potassium chloride  20 mEq Oral TID  . sodium chloride  3 mL Intravenous Q12H   Continuous Infusions: . sodium chloride 10 mL/hr at 02/07/15 0132   PRN Meds:.acetaminophen, alum & mag hydroxide-simeth, bisacodyl, guaiFENesin-dextromethorphan, hydrALAZINE, magnesium hydroxide, methocarbamol **OR** methocarbamol (ROBAXIN)  IV, metoCLOPramide **OR** metoCLOPramide (REGLAN) injection, ondansetron **OR** ondansetron (ZOFRAN) IV, ondansetron **OR** ondansetron (ZOFRAN) IV, oxyCODONE-acetaminophen, senna-docusate    Objective: Weight change: -7.1 oz (-0.2 kg)  Intake/Output Summary (Last 24 hours) at 02/08/15 1426 Last data filed at 02/08/15 1308  Gross per 24 hour  Intake      0 ml  Output   5025 ml  Net  -5025 ml   Blood pressure 177/106, pulse 84, temperature 98.5 F (36.9 C), temperature source Oral, resp. rate 12, height 6' 2"  (1.88 m), weight 255 lb 3.2 oz (115.758 kg), SpO2 100 %. Temp:  [98.4 F (36.9 C)-99.3 F (37.4 C)] 98.5 F (36.9 C) (02/09 1202) Pulse Rate:  [82-99] 84 (02/09 1202) Resp:  [12-18] 12 (02/09 0810) BP: (150-214)/(71-108) 177/106  mmHg (02/09 1202) SpO2:  [97 %-100 %] 100 % (02/09 1202) Weight:  [255 lb 3.2 oz (115.758 kg)-263 lb 0.1 oz (119.3 kg)] 255 lb 3.2 oz (115.758 kg) (02/09 1040)  Physical Exam: General: aox3  CV: Systolic murmur loudest at PMI Pulm: clear, no wheezes or rhonchi  Shoulder: with dressing Neuro; non focal CBC:   CBC Latest Ref Rng 02/06/2015 02/05/2015 02/04/2015  WBC 4.0 - 10.5 K/uL 6.3 4.7 4.6  Hemoglobin 13.0 - 17.0 g/dL 8.2(L) 8.1(L) 8.1(L)  Hematocrit 39.0 - 52.0 % 25.3(L) 25.3(L) 25.2(L)  Platelets 150 - 400 K/uL 330 318 292     BMET  Recent Labs  02/07/15 0543 02/08/15 0500  NA 138 138  K 3.7 3.3*  CL 105 102  CO2 29 27  GLUCOSE 154* 182*  BUN 18 18  CREATININE 1.80* 1.67*  CALCIUM 8.3* 8.2*     Liver Panel  No results for input(s): PROT, ALBUMIN, AST, ALT, ALKPHOS, BILITOT, BILIDIR, IBILI in the last 72 hours.     Sedimentation Rate  Recent Labs  02/07/15 0543  ESRSEDRATE 93*   C-Reactive Protein  Recent Labs  02/07/15 0543  CRP 2.1*    Micro Results: Recent Results (from the past 720 hour(s))  Culture, blood (routine x 2)     Status: None   Collection Time: 01/10/15  4:35 AM  Result Value Ref Range Status   Specimen Description  BLOOD RIGHT HAND  Final   Special Requests BOTTLES DRAWN AEROBIC AND ANAEROBIC 10CC EA  Final   Culture   Final    STAPHYLOCOCCUS AUREUS Note: SUSCEPTIBILITIES PERFORMED ON PREVIOUS CULTURE WITHIN THE LAST 5 DAYS. Note: Culture results may be compromised due to an excessive volume of blood received in culture bottles. Gram Stain Report Called to,Read Back By and Verified With:  MONICA YIM ON 01/10/2015 AT 11:36P BY WILEJ Performed at [facility]    Report Status 01/13/2015 FINAL  Final  Culture, blood (routine x 2)     Status: None   Collection Time: 01/10/15  4:46 AM  Result Value Ref Range Status   Specimen Description BLOOD LEFT ARM  Final   Special Requests BOTTLES DRAWN AEROBIC AND ANAEROBIC 10CC EA   Final   Culture   Final    STAPHYLOCOCCUS AUREUS Note: SUSCEPTIBILITIES PERFORMED ON PREVIOUS CULTURE WITHIN THE LAST 5 DAYS. Note: Culture results may be compromised due to an excessive volume of blood received in culture bottles. Gram Stain Report Called to,Read Back By and Verified With: MONICA YIM ON 01/10/2015 AT 11:36P BY WILEJ Performed at [facility]    Report Status 01/13/2015 FINAL  Final  Respiratory virus panel (routine influenza)     Status: None   Collection Time: 01/10/15 10:34 AM  Result Value Ref Range Status   Source - RVPAN NASAL SWAB  Corrected    Comment: CORRECTED ON 01/12 AT 2036: PREVIOUSLY REPORTED AS NASAL SWAB   Respiratory Syncytial Virus A NOT DETECTED  Final   Respiratory Syncytial Virus B NOT DETECTED  Final   Influenza A NOT DETECTED  Final   Influenza B NOT DETECTED  Final   Parainfluenza 1 NOT DETECTED  Final   Parainfluenza 2 NOT DETECTED  Final   Parainfluenza 3 NOT DETECTED  Final   Metapneumovirus NOT DETECTED  Final   Rhinovirus NOT DETECTED  Final   Adenovirus NOT DETECTED  Final   Influenza A H1 NOT DETECTED  Final   Influenza A H3 NOT DETECTED  Final    Comment: (NOTE)       Normal Reference Range for each Analyte: NOT DETECTED Testing performed using the Luminex xTAG Respiratory Viral Panel test kit. The analytical performance characteristics of this assay have been determined by [facility].  The modifications have not been cleared or approved by the FDA. This assay has been validated pursuant to the CLIA regulations and is used for clinical purposes. Performed at [facility], blood (routine x 2)     Status: None   Collection Time: 01/10/15 11:19 AM  Result Value Ref Range Status   Specimen Description BLOOD LEFT FOREARM  Final   Special Requests BOTTLES DRAWN AEROBIC ONLY 5CC  Final   Culture   Final    STAPHYLOCOCCUS AUREUS Note: RIFAMPIN AND GENTAMICIN SHOULD NOT BE USED AS SINGLE DRUGS FOR  TREATMENT OF STAPH INFECTIONS. Note: Gram Stain Report Called to,Read Back By and Verified With: NURSE MONICA YIM 01/11/15 6:05AM THOMI Performed at [facility]    Report Status 01/13/2015 FINAL  Final   Organism ID, Bacteria STAPHYLOCOCCUS AUREUS  Final      Susceptibility   Staphylococcus aureus - MIC*    CLINDAMYCIN <=0.25 SENSITIVE Sensitive     ERYTHROMYCIN <=0.25 SENSITIVE Sensitive     GENTAMICIN <=0.5 SENSITIVE Sensitive     LEVOFLOXACIN 4 INTERMEDIATE Intermediate     OXACILLIN 0.5 SENSITIVE Sensitive  PENICILLIN 0.12 SENSITIVE Sensitive     RIFAMPIN 2 INTERMEDIATE Intermediate     TRIMETH/SULFA <=10 SENSITIVE Sensitive     VANCOMYCIN 1 SENSITIVE Sensitive     TETRACYCLINE <=1 SENSITIVE Sensitive     MOXIFLOXACIN 2 RESISTANT Resistant     * STAPHYLOCOCCUS AUREUS  Culture, blood (routine x 2)     Status: None   Collection Time: 01/10/15 11:27 AM  Result Value Ref Range Status   Specimen Description BLOOD LEFT ANTECUBITAL  Final   Special Requests BOTTLES DRAWN AEROBIC ONLY [place]  Final   Culture   Final    STAPHYLOCOCCUS AUREUS Note: SUSCEPTIBILITIES PERFORMED ON PREVIOUS CULTURE WITHIN THE LAST 5 DAYS. Note: Gram Stain Report Called to,Read Back By and Verified With: NURSE MONICA YIM 01/11/15 6:05AM THOMI Performed at [facility]    Report Status 01/13/2015 FINAL  Final  Culture, blood (routine x 2)     Status: None   Collection Time: 01/13/15  7:20 PM  Result Value Ref Range Status   Specimen Description BLOOD RIGHT ARM  Final   Special Requests BOTTLES DRAWN AEROBIC AND ANAEROBIC 5CC  Final   Culture   Final    NO GROWTH 5 DAYS Performed at [facility]    Report Status 01/20/2015 FINAL  Final  Culture, blood (routine x 2)     Status: None   Collection Time: 01/13/15  7:20 PM  Result Value Ref Range Status   Specimen Description BLOOD RIGHT ARM  Final   Special Requests BOTTLES DRAWN AEROBIC AND ANAEROBIC 3CC  Final   Culture   Final      NO GROWTH 5 DAYS Performed at [facility]    Report Status 01/20/2015 FINAL  Final  Body fluid culture     Status: None   Collection Time: 01/14/15  9:02 AM  Result Value Ref Range Status   Specimen Description FLUID RIGHT SHOULDER SYNOVIAL  Final   Special Requests NONE  Final   Gram Stain   Final    FEW WBC PRESENT,BOTH PMN AND MONONUCLEAR NO ORGANISMS SEEN Performed at [facility]    Culture   Final    NO GROWTH 3 DAYS Performed at [facility]    Report Status 01/17/2015 FINAL  Final  Culture, blood (routine x 2)     Status: None   Collection Time: 01/18/15  6:10 PM  Result Value Ref Range Status   Specimen Description BLOOD LEFT ARM  Final   Special Requests BOTTLES DRAWN AEROBIC AND ANAEROBIC 10CC  Final   Culture   Final    NO GROWTH 5 DAYS Performed at [facility]    Report Status 01/25/2015 FINAL  Final  Culture, blood (routine x 2)     Status: None   Collection Time: 01/18/15  6:18 PM  Result Value Ref Range Status   Specimen Description BLOOD RIGHT ARM  Final   Special Requests BOTTLES DRAWN AEROBIC AND ANAEROBIC 10CC  Final   Culture   Final    NO GROWTH 5 DAYS Performed at [facility]    Report Status 01/25/2015 FINAL  Final  Surgical pcr screen     Status: None   Collection Time: 01/25/15  4:47 AM  Result Value Ref Range Status   MRSA, PCR NEGATIVE NEGATIVE Final   Staphylococcus aureus NEGATIVE NEGATIVE Final    Comment:        The Xpert SA Assay (FDA approved for NASAL specimens in patients  over 2 years of age), is one component of a comprehensive surveillance program.  Test performance has been validated by [facility] for patients greater than or equal to 2 year old. It is not intended to diagnose infection nor to guide or monitor treatment.   Culture, blood (routine x 2)     Status: None   Collection Time: 02/02/15  3:53 PM  Result Value Ref Range Status   Specimen Description BLOOD PICC  LINE  Final   Special Requests BOTTLES DRAWN AEROBIC AND ANAEROBIC 5ML  Final   Culture   Final    NO GROWTH 5 DAYS Performed at [facility]    Report Status 02/08/2015 FINAL  Final  Culture, blood (routine x 2)     Status: None   Collection Time: 02/02/15  4:32 PM  Result Value Ref Range Status   Specimen Description BLOOD PICC LINE  Final   Special Requests BOTTLES DRAWN AEROBIC AND ANAEROBIC 10CC  Final   Culture   Final    NO GROWTH 5 DAYS Performed at [facility]    Report Status 02/08/2015 FINAL  Final  MRSA PCR Screening     Status: None   Collection Time: 02/02/15  5:14 PM  Result Value Ref Range Status   MRSA by PCR NEGATIVE NEGATIVE Final    Comment:        The GeneXpert MRSA Assay (FDA approved for NASAL specimens only), is one component of a comprehensive MRSA colonization surveillance program. It is not intended to diagnose MRSA infection nor to guide or monitor treatment for MRSA infections.   Body fluid culture     Status: None   Collection Time: 02/04/15  5:45 PM  Result Value Ref Range Status   Specimen Description SYNOVIAL FLUID SHOULDER RIGHT  Final   Special Requests NONE  Final   Gram Stain   Final    FEW WBC PRESENT, PREDOMINANTLY PMN NO ORGANISMS SEEN Performed at [facility]    Culture   Final    NO GROWTH 3 DAYS Performed at [facility]    Report Status 02/08/2015 FINAL  Final  Surgical pcr screen     Status: None   Collection Time: 02/05/15  5:06 AM  Result Value Ref Range Status   MRSA, PCR NEGATIVE NEGATIVE Final   Staphylococcus aureus NEGATIVE NEGATIVE Final    Comment:        The Xpert SA Assay (FDA approved for NASAL specimens in patients over 28 years of age), is one component of a comprehensive surveillance program.  Test performance has been validated by [facility] for patients greater than or equal to 36 year old. It is not intended to diagnose infection nor to guide or monitor  treatment.     Studies/Results: Mr Herby Abraham Contrast  02/07/2015   CLINICAL DATA:  Initial evaluation for acute facial numbness. Status post recent debridement of glenohumeral joint. History of acute renal failure, hypertension, diabetes, septic shock, and endocarditis.  EXAM: MRI HEAD WITHOUT CONTRAST  TECHNIQUE: Multiplanar, multiecho pulse sequences of the brain and surrounding structures were obtained without intravenous contrast.  COMPARISON:  Prior study from 01/14/2015.  FINDINGS: Cerebral volume within normal limits for patient age. No significant small vessel changes. No mass lesion, midline shift, or mass effect. Ventricles are normal in size without hydrocephalus. No extra-axial fluid collection.  Again seen are several scattered subcentimeter foci of restricted diffusion involving the deep white matter of both cerebral hemispheres an the  centrum semi ovale. For reference purposes, the largest of these foci within the left cerebral hemisphere measures 6 mm (series 4, image 39). The largest of these foci in the right cerebral hemisphere measures approximately 4 mm (series 4, image 41). Subcentimeter focus measuring 3 mm in the left putamen/anterior limb of the left internal capsule noted. Several of these foci appear to be of slightly different age (the foci seen on in images 65 and 18 of series 4 appear to be slightly more acute for example). Again, susceptibility artifact seen on gradient echo sequence with many of these foci. Findings likely related to septic emboli given history of bacteremia and endocarditis. No associated mass effect. No frank parenchymal hemorrhage. No infratentorial infarcts.  Craniocervical junction normal. Pituitary gland within normal limits. No acute abnormality seen about the orbits.  Paranasal sinuses and mastoid air cells are clear.  Bone marrow signal intensity normal. No acute abnormality seen within the scalp soft tissues.  IMPRESSION: Multi focal subcentimeter acute  to subacute ischemic infarcts as detailed above. Again, these findings are likely related to central thromboembolic disease/septic emboli given history of bacteremia and endocarditis. While several of these foci appear to be of slightly different ages, these are new relative to prior MRI from 01/14/2015, where the previously seen infarcts have resolved/normalized. No significant mass effect or parenchymal hemorrhage. Overall, the number of infarcts is decreased from prior study.   Electronically Signed   By: Dwayne [name] M.D.   On: 02/07/2015 06:02      Assessment/Plan:  Principal Problem:   Acute diastolic CHF (congestive heart failure), NYHA class 3 Active Problems:   Diabetes type 2, uncontrolled   Uncontrolled hypertension   Acute renal failure   Severe mitral regurgitation   Endocarditis   Anasarca   Arthritis, septic, shoulder   Chronic renal insufficiency, stage III (moderate)   Acute renal failure superimposed on stage 3 chronic kidney disease   Bacterial endocarditis   Edema   HTN (hypertension)   Acute on chronic diastolic heart failure    Dwayne [name] is a 35 y.o. 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 Sharol Given performing I and D of shoulder which should help [place] control this infection. Again I worry that he needs valve surgery more urgently now   #2 Septic arthritis with osteomyelitis: sp I and D by Dr. Sharol Given Will consider re-imaging shoulder down the road to reassess bone  #3 Vertebral osteomyelitis, diskitis: he has NO new pain in the back, in fact no pain at all  I would consider repeat MRI 2  months after the last one to ensure this is progressing at appropriate pace (keeping in mind that imaging can lag behind clinical progress here)  I do not see a clinical reason to get an MRI of spine at this point, in fact bone and disc can look worse a month into therapy. He never had epidural abscess when originally imaged     LOS: 6 days   Alcide Evener 02/08/2015, 2:26 PM

## 2015-02-09 DIAGNOSIS — M009 Pyogenic arthritis, unspecified: Secondary | ICD-10-CM | POA: Insufficient documentation

## 2015-02-09 DIAGNOSIS — M00811 Arthritis due to other bacteria, right shoulder: Secondary | ICD-10-CM

## 2015-02-09 DIAGNOSIS — I5033 Acute on chronic diastolic (congestive) heart failure: Secondary | ICD-10-CM

## 2015-02-09 LAB — BASIC METABOLIC PANEL
Anion gap: 11 (ref 5–15)
BUN: 21 mg/dL (ref 6–23)
CALCIUM: 8.2 mg/dL — AB (ref 8.4–10.5)
CO2: 27 mmol/L (ref 19–32)
Chloride: 99 mmol/L (ref 96–112)
Creatinine, Ser: 1.96 mg/dL — ABNORMAL HIGH (ref 0.50–1.35)
GFR calc Af Amer: 50 mL/min — ABNORMAL LOW (ref >90)
GFR, EST NON AFRICAN AMERICAN: 43 mL/min — AB (ref >90)
GLUCOSE: 284 mg/dL — AB (ref 70–99)
Potassium: 3.7 mmol/L (ref 3.5–5.1)
Sodium: 137 mmol/L (ref 135–145)

## 2015-02-09 LAB — GLUCOSE, CAPILLARY
GLUCOSE-CAPILLARY: 176 mg/dL — AB (ref 70–99)
Glucose-Capillary: 169 mg/dL — ABNORMAL HIGH (ref 70–99)
Glucose-Capillary: 265 mg/dL — ABNORMAL HIGH (ref 70–99)
Glucose-Capillary: 155 mg/dL — ABNORMAL HIGH (ref 70–99)
Glucose-Capillary: 257 mg/dL — ABNORMAL HIGH (ref 70–99)

## 2015-02-09 MED ORDER — OXYCODONE-ACETAMINOPHEN 5-325 MG PO TABS
ORAL_TABLET | ORAL | Status: AC
  Administered 2015-02-09: 02:00:00
  Filled 2015-02-09: qty 2

## 2015-02-09 MED ORDER — POTASSIUM CHLORIDE ER 10 MEQ PO TBCR
20.0000 meq | EXTENDED_RELEASE_TABLET | Freq: Three times a day (TID) | ORAL | Status: AC
  Administered 2015-02-09 – 2015-02-10 (×3): 20 meq via ORAL
  Filled 2015-02-09 (×6): qty 2

## 2015-02-09 MED ORDER — METOLAZONE 5 MG PO TABS
2.5000 mg | ORAL_TABLET | ORAL | Status: DC
  Administered 2015-02-10: 2.5 mg via ORAL

## 2015-02-09 MED ORDER — AMLODIPINE BESYLATE 10 MG PO TABS
10.0000 mg | ORAL_TABLET | Freq: Every day | ORAL | Status: DC
  Administered 2015-02-10 – 2015-02-14 (×5): 10 mg via ORAL
  Filled 2015-02-09 (×5): qty 1

## 2015-02-09 MED ORDER — INSULIN DETEMIR 100 UNIT/ML ~~LOC~~ SOLN
10.0000 [IU] | Freq: Every day | SUBCUTANEOUS | Status: DC
  Administered 2015-02-09 – 2015-02-17 (×9): 10 [IU] via SUBCUTANEOUS
  Filled 2015-02-09 (×14): qty 0.1

## 2015-02-09 MED ORDER — AMLODIPINE BESYLATE 5 MG PO TABS
5.0000 mg | ORAL_TABLET | Freq: Every day | ORAL | Status: DC
  Administered 2015-02-09: 5 mg via ORAL
  Filled 2015-02-09: qty 1

## 2015-02-09 NOTE — Progress Notes (Signed)
Physical Therapy Treatment and D/C Patient Details Name: Jerry Clyne MRN: [id number] DOB: Feb 25, 1980 Today's Date: 02/09/2015    History of Present Illness Riki Gehring  is a 35 y.o. male, with recent diagnosis of mitral valve endocarditis due to poor dentition with MSSA bacteremia, chronic kidney disease stage III Baseline creatinine around 2.2, recent septic right shoulder arthritis, pericardial effusion, DM type II, diastolic dysfunction EF 74%, who received a left arm PICC line and was on IV ampicillin, he had multiple infected teeth removed about one week ago. He was planned to undergo open heart surgery for valve replacement in 4-6 weeks. admitted with chest pain, anasarca, scrotal edema, and R shoulder septic arthritis; Underwent R shoulder I& D and subacromial decompression on 2/6    PT Comments    Pt admitted with above diagnosis. Pt currently without functional limitations and is ambulating independently.  All PT goals met and pt no longer needs inpt PT.   Pt will only need skilled Outpt PT when d/c'd home for right shoulder progression of ROM.  OT to continue to work with pt for right shoulder progression in hospital.    Follow Up Recommendations  Outpatient PT (for shoulder ROM progression (OT following in hospital))     Equipment Recommendations  None recommended by PT    Recommendations for Other Services       Precautions / Restrictions Precautions Precautions: Fall;Other (comment) (scrotal edema) Restrictions Weight Bearing Restrictions: No    Mobility  Bed Mobility Overal bed mobility: Independent                Transfers Overall transfer level: Independent                  Ambulation/Gait Ambulation/Gait assistance: Independent Ambulation Distance (Feet): 450 Feet Assistive device: None Gait Pattern/deviations: Wide base of support   Gait velocity interpretation: <1.8 ft/sec, indicative of risk for recurrent falls General Gait  Details: discomfort from scrotal edema slower gait and contributing to wide step width; no gross loss of balance noted.  Pt has met goals. Declined practice on stairs.    Stairs            Wheelchair Mobility    Modified Rankin (Stroke Patients Only)       Balance Overall balance assessment: No apparent balance deficits (not formally assessed)                                  Cognition Arousal/Alertness: Awake/alert Behavior During Therapy: WFL for tasks assessed/performed Overall Cognitive Status: Within Functional Limits for tasks assessed                      Exercises      General Comments General comments (skin integrity, edema, etc.): Full body edema.      Pertinent Vitals/Pain Pain Assessment: Faces Faces Pain Scale: Hurts even more Pain Location: right shoulder Pain Descriptors / Indicators: Grimacing;Discomfort;Guarding Pain Intervention(s): Limited activity within patient's tolerance;Monitored during session;Repositioned  VSS    Home Living                      Prior Function            PT Goals (current goals can now be found in the care plan section) Progress towards PT goals: Goals met/education completed, patient discharged from PT    Frequency       PT  Plan Discharge plan needs to be updated    Co-evaluation             End of Session Equipment Utilized During Treatment: Gait belt Activity Tolerance: Patient tolerated treatment well Patient left: in bed;with call bell/phone within reach     Time: 1221-1235 PT Time Calculation (min) (ACUTE ONLY): 14 min  Charges:  $Gait Training: 8-22 mins                    G Codes:      WhiteGodfrey Pick 2015-02-10, 2:15 PM [facility] [facility] [phone] (pager)

## 2015-02-09 NOTE — Progress Notes (Signed)
Inpatient Diabetes Program Recommendations  AACE/ADA: New Consensus Statement on Inpatient Glycemic Control (2013)  Target Ranges:  Prepandial:   less than 140 mg/dL      Peak postprandial:   less than 180 mg/dL (1-2 hours)      Critically ill patients:  140 - 180 mg/dL   Reason for assessment:  Results for Dwayne HickmanSTEVENSON, [place] (MRN [id number]) as of 02/09/2015 12:48  Ref. Range 02/08/2015 11:29 02/08/2015 16:18 02/08/2015 23:31 02/09/2015 07:42 02/09/2015 11:38  Glucose-Capillary Latest Range: 70-99 mg/dL 914241 (H) 782205 (H) 956199 (H) 176 (H) 265 (H)    Diabetes history: Type 2 diabetes Outpatient Diabetes medications: 70/30 14 units bid Current orders for Inpatient glycemic control:  Novolog sensitive tid with meals.  Consider restarting a low dose of basal insulin while patient is in the hospital.  Consider Levemir 10 units daily.    Thanks, Beryl MeagerJenny Marcianne Ozbun, RN, BC-ADM Inpatient Diabetes Coordinator Pager [phone]

## 2015-02-09 NOTE — Progress Notes (Signed)
Unable to give Imdur as pharmacy did not have it loaded into Pyxxis. Requested via text but no pill available

## 2015-02-09 NOTE — Progress Notes (Signed)
[facility]    Subjective:   Complaining of chills   Antibiotics:  Anti-infectives    Start     Dose/Rate Route Frequency Ordered Stop   02/02/15 1700  ampicillin (OMNIPEN) 2 g in sodium chloride 0.9 % 50 mL IVPB     2 g 150 mL/hr over 20 Minutes Intravenous Every 4 hours 02/02/15 1638        Medications: Scheduled Meds: . [START ON 02/10/2015] amLODipine  10 mg Oral Daily  . ampicillin (OMNIPEN) IV  2 g Intravenous Q4H  . carvedilol  50 mg Oral BID WC  . docusate sodium  100 mg Oral BID  . feeding supplement (ENSURE COMPLETE)  237 mL Oral TID WC  . furosemide  120 mg Intravenous BID  . hydrALAZINE  150 mg Oral 3 times per day  . Influenza vac split quadrivalent PF  0.5 mL Intramuscular Tomorrow-1000  . insulin aspart  0-9 Units Subcutaneous TID WC  . insulin detemir  10 Units Subcutaneous QHS  . isosorbide mononitrate  60 mg Oral BID  . [START ON 02/10/2015] metolazone  2.5 mg Oral Q24H  . potassium chloride  20 mEq Oral TID  . sodium chloride  3 mL Intravenous Q12H   Continuous Infusions: . sodium chloride 10 mL/hr at 02/07/15 0132   PRN Meds:.acetaminophen, alum & mag hydroxide-simeth, bisacodyl, guaiFENesin-dextromethorphan, hydrALAZINE, magnesium hydroxide, methocarbamol **OR** methocarbamol (ROBAXIN)  IV, metoCLOPramide **OR** metoCLOPramide (REGLAN) injection, ondansetron **OR** ondansetron (ZOFRAN) IV, ondansetron **OR** ondansetron (ZOFRAN) IV, oxyCODONE-acetaminophen, senna-docusate    Objective: Weight change: -7 lb 12.9 oz (-3.542 kg)  Intake/Output Summary (Last 24 hours) at 02/09/15 1855 Last data filed at 02/09/15 1612  Gross per 24 hour  Intake      0 ml  Output   3350 ml  Net  -3350 ml   Blood pressure 161/97, pulse 89, temperature 98.7 F (37.1 C), temperature source Oral, resp. rate 12, height 6\' 2"  (1.88 m), weight 255 lb 11.7 oz (116 kg), SpO2 100 %. Temp:  [97.9 F (36.6 C)-98.7 F (37.1 C)] 98.7 F (37.1 C)  (02/10 0400) Pulse Rate:  [89-91] 89 (02/10 1735) BP: (147-185)/(74-120) 161/97 mmHg (02/10 1735) SpO2:  [97 %-100 %] 100 % (02/10 0400) Weight:  [255 lb 11.7 oz (116 kg)] 255 lb 11.7 oz (116 kg) (02/10 0400)  Physical Exam: General: aox3  Having some apparent rigors though was afebrile CV: Systolic murmur loudest at PMI with radiation to axilla Pulm: clear, no wheezes or rhonchi  Shoulder: with dressing Neuro; non focal CBC:   CBC Latest Ref Rng 02/06/2015 02/05/2015 02/04/2015  WBC 4.0 - 10.5 K/uL 6.3 4.7 4.6  Hemoglobin 13.0 - 17.0 g/dL 8.2(L) 8.1(L) 8.1(L)  Hematocrit 39.0 - 52.0 % 25.3(L) 25.3(L) 25.2(L)  Platelets 150 - 400 K/uL 330 318 292     BMET  Recent Labs  02/08/15 0500 02/09/15 1305  NA 138 137  K 3.3* 3.7  CL 102 99  CO2 27 27  GLUCOSE 182* 284*  BUN 18 21  CREATININE 1.67* 1.96*  CALCIUM 8.2* 8.2*     Liver Panel  No results for input(s): PROT, ALBUMIN, AST, ALT, ALKPHOS, BILITOT, BILIDIR, IBILI in the last 72 hours.     Sedimentation Rate  Recent Labs  02/07/15 0543  ESRSEDRATE 93*   C-Reactive Protein  Recent Labs  02/07/15 0543  CRP 2.1*    Micro Results: Recent Results (from the past 720 hour(s))  Culture, blood (routine x 2)  Status: None   Collection Time: 01/13/15  7:20 PM  Result Value Ref Range Status   Specimen Description BLOOD RIGHT ARM  Final   Special Requests BOTTLES DRAWN AEROBIC AND ANAEROBIC 5CC  Final   Culture   Final    NO GROWTH 5 DAYS Performed at [facility]    Report Status 01/20/2015 FINAL  Final  Culture, blood (routine x 2)     Status: None   Collection Time: 01/13/15  7:20 PM  Result Value Ref Range Status   Specimen Description BLOOD RIGHT ARM  Final   Special Requests BOTTLES DRAWN AEROBIC AND ANAEROBIC 3CC  Final   Culture   Final    NO GROWTH 5 DAYS Performed at [facility]    Report Status 01/20/2015 FINAL  Final  Body fluid culture     Status: None   Collection Time:  01/14/15  9:02 AM  Result Value Ref Range Status   Specimen Description FLUID RIGHT SHOULDER SYNOVIAL  Final   Special Requests NONE  Final   Gram Stain   Final    FEW WBC PRESENT,BOTH PMN AND MONONUCLEAR NO ORGANISMS SEEN Performed at [facility]    Culture   Final    NO GROWTH 3 DAYS Performed at [facility]    Report Status 01/17/2015 FINAL  Final  Culture, blood (routine x 2)     Status: None   Collection Time: 01/18/15  6:10 PM  Result Value Ref Range Status   Specimen Description BLOOD LEFT ARM  Final   Special Requests BOTTLES DRAWN AEROBIC AND ANAEROBIC 10CC  Final   Culture   Final    NO GROWTH 5 DAYS Performed at [facility]    Report Status 01/25/2015 FINAL  Final  Culture, blood (routine x 2)     Status: None   Collection Time: 01/18/15  6:18 PM  Result Value Ref Range Status   Specimen Description BLOOD RIGHT ARM  Final   Special Requests BOTTLES DRAWN AEROBIC AND ANAEROBIC 10CC  Final   Culture   Final    NO GROWTH 5 DAYS Performed at [facility]    Report Status 01/25/2015 FINAL  Final  Surgical pcr screen     Status: None   Collection Time: 01/25/15  4:47 AM  Result Value Ref Range Status   MRSA, PCR NEGATIVE NEGATIVE Final   Staphylococcus aureus NEGATIVE NEGATIVE Final    Comment:        The Xpert SA Assay (FDA approved for NASAL specimens in patients over 79 years of age), is one component of a comprehensive surveillance program.  Test performance has been validated by [facility] for patients greater than or equal to 38 year old. It is not intended to diagnose infection nor to guide or monitor treatment.   Culture, blood (routine x 2)     Status: None   Collection Time: 02/02/15  3:53 PM  Result Value Ref Range Status   Specimen Description BLOOD PICC LINE  Final   Special Requests BOTTLES DRAWN AEROBIC AND ANAEROBIC  Final   Culture   Final    NO GROWTH 5 DAYS Performed at [facility]     Report Status 02/08/2015 FINAL  Final  Culture, blood (routine x 2)     Status: None   Collection Time: 02/02/15  4:32 PM  Result Value Ref Range Status   Specimen Description BLOOD PICC LINE  Final   Special Requests  BOTTLES DRAWN AEROBIC AND ANAEROBIC 10CC  Final   Culture   Final    NO GROWTH 5 DAYS Performed at [facility]    Report Status 02/08/2015 FINAL  Final  MRSA PCR Screening     Status: None   Collection Time: 02/02/15  5:14 PM  Result Value Ref Range Status   MRSA by PCR NEGATIVE NEGATIVE Final    Comment:        The GeneXpert MRSA Assay (FDA approved for NASAL specimens only), is one component of a comprehensive MRSA colonization surveillance program. It is not intended to diagnose MRSA infection nor to guide or monitor treatment for MRSA infections.   Body fluid culture     Status: None   Collection Time: 02/04/15  5:45 PM  Result Value Ref Range Status   Specimen Description SYNOVIAL FLUID SHOULDER RIGHT  Final   Special Requests NONE  Final   Gram Stain   Final    FEW WBC PRESENT, PREDOMINANTLY PMN NO ORGANISMS SEEN Performed at [facility]    Culture   Final    NO GROWTH 3 DAYS Performed at [facility]    Report Status 02/08/2015 FINAL  Final  Surgical pcr screen     Status: None   Collection Time: 02/05/15  5:06 AM  Result Value Ref Range Status   MRSA, PCR NEGATIVE NEGATIVE Final   Staphylococcus aureus NEGATIVE NEGATIVE Final    Comment:        The Xpert SA Assay (FDA approved for NASAL specimens in patients over 14 years of age), is one component of a comprehensive surveillance program.  Test performance has been validated by [facility] for patients greater than or equal to 82 year old. It is not intended to diagnose infection nor to guide or monitor treatment.     Studies/Results: No results found.    Assessment/Plan:  Principal Problem:   Acute diastolic CHF (congestive heart failure), NYHA class  3 Active Problems:   Diabetes type 2, uncontrolled   Uncontrolled hypertension   Acute renal failure   Severe mitral regurgitation   Endocarditis   Anasarca   Arthritis, septic, shoulder   Chronic renal insufficiency, stage III (moderate)   Acute renal failure superimposed on stage 3 chronic kidney disease   Bacterial endocarditis   Edema   HTN (hypertension)   Acute on chronic diastolic heart failure   Facial numbness   Vertebral osteomyelitis    Dwayne [name] is a 35 y.o. 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 with perforation severe mitral regurgitation, septic emboli to the brain right septic shoulder and thoracic spine discitis sp I and D shoulder, now with MRI showing new septic emboli to brain on effective antibiotics therapy  --PRESENCE OF NEW EMBOLI IN THE BRAIN WHILE ON PROPER ANTIBIOTICS IS AN INDICATION FOR MORE URGENT VALVE REPLACEMENT  --I WOULD FAVOR VALVE REPLACEMENT AS SOON AS POSSIBLE AS I HAVE ANXIETY ABOUT PT HAVING A POTENTIALLY DISABLING EMBOLISM TO HIS CNS   --CONTINUE  high dose AMPICILLIN  IF HE HAS FEVERS, NEED REPEAT BLOOD CULTURES, BOTHERED BY HIM HAVING RIGORS TODAY  Greatly appreciate  Dr. Lajoyce Corners performing I and D of shoulder which should help [place] control this infection. Again I worry that he needs valve surgery more urgently now   #2 Septic arthritis with osteomyelitis: sp I and D by Dr. Lajoyce Corners Will consider re-imaging shoulder down the road to reassess bone  #3 Vertebral osteomyelitis, diskitis: he has NO new pain in the  back, in fact no pain at all  I would consider repeat MRI 2 months after the last one to ensure this is progressing at appropriate pace (keeping in mind that imaging can lag behind clinical progress here)  I do not see a clinical reason to get an MRI of spine at this point, in fact bone and disc can look worse a month into therapy. He never had epidural abscess when originally imaged     LOS: 7 days   Acey Lav 02/09/2015, 6:55 PM

## 2015-02-09 NOTE — Progress Notes (Signed)
Patient Name: Dwayne HickmanBenjamin [name] Date of Encounter: 02/09/2015  Principal Problem:   Acute diastolic CHF (congestive heart failure), NYHA class 3 Active Problems:   Diabetes type 2, uncontrolled   Uncontrolled hypertension   Acute renal failure   Severe mitral regurgitation   Endocarditis   Anasarca   Arthritis, septic, shoulder   Chronic renal insufficiency, stage III (moderate)   Acute renal failure superimposed on stage 3 chronic kidney disease   Bacterial endocarditis   Edema   HTN (hypertension)   Acute on chronic diastolic heart failure   Facial numbness   Vertebral osteomyelitis   Length of Stay: 7  SUBJECTIVE  Denies dyspnea. Constipation and shoulder pain are his biggest complaints. He is eating [employer]: 750 mg Na per serving, 2 servings per can! Nevertheless, metolazone helped and diuresis is greatly improved, weight down to 115 kg.  K 3.3  CURRENT MEDS . ampicillin (OMNIPEN) IV  2 g Intravenous Q4H  . carvedilol  50 mg Oral BID WC  . docusate sodium  100 mg Oral BID  . feeding supplement (ENSURE COMPLETE)  237 mL Oral TID WC  . furosemide  120 mg Intravenous BID  . hydrALAZINE  150 mg Oral 3 times per day  . Influenza vac split quadrivalent PF  0.5 mL Intramuscular Tomorrow-1000  . insulin aspart  0-9 Units Subcutaneous TID WC  . isosorbide mononitrate  60 mg Oral BID  . metolazone  2.5 mg Oral Daily  . sodium chloride  3 mL Intravenous Q12H    OBJECTIVE   Intake/Output Summary (Last 24 hours) at 02/09/15 1209 Last data filed at 02/09/15 0000  Gross per 24 hour  Intake    120 ml  Output   3675 ml  Net  -3555 ml   Filed Weights   02/08/15 0400 02/08/15 1040 02/09/15 0400  Weight: 119.3 kg (263 lb 0.1 oz) 115.758 kg (255 lb 3.2 oz) 116 kg (255 lb 11.7 oz)    PHYSICAL EXAM Filed Vitals:   02/09/15 0000 02/09/15 0400 02/09/15 0453 02/09/15 0840  BP: 185/120  147/74 176/107  Pulse:    91  Temp: 97.9 F (36.6 C) 98.7 F (37.1 C)     TempSrc: Axillary Oral    Resp:      Height:      Weight:  116 kg (255 lb 11.7 oz)    SpO2: 97% 100%     General: Alert, oriented x3, no distress Head: no evidence of trauma, PERRL, EOMI, no exophtalmos or lid lag, no myxedema, no xanthelasma; normal ears, nose and oropharynx Neck: normal jugular venous pulsations and no hepatojugular reflux; brisk carotid pulses without delay and no carotid bruits Chest: clear to auscultation, no signs of consolidation by percussion or palpation, normal fremitus, symmetrical and full respiratory excursions Cardiovascular: normal position and quality of the apical impulse, regular rhythm, normal first and second heart sounds, no rubs or gallops, 2/6 apical holosystolic murmur Abdomen: no tenderness or distention, no masses by palpation, no abnormal pulsatility or arterial bruits, normal bowel sounds, no hepatosplenomegaly Extremities: no clubbing, cyanosis; 1+ bilateral pedal edema; 2+ radial, ulnar and brachial pulses bilaterally; 2+ right femoral, posterior tibial and dorsalis pedis pulses; 2+ left femoral, posterior tibial and dorsalis pedis pulses; no subclavian or femoral bruits Neurological: grossly nonfocal  LABS  Basic Metabolic Panel  Recent Labs  02/07/15 0543 02/08/15 0500  NA 138 138  K 3.7 3.3*  CL 105 102  CO2 29 27  GLUCOSE 154* 182*  BUN  18 18  CREATININE 1.80* 1.67*  CALCIUM 8.3* 8.2*  MG 1.9 1.9    Radiology Studies Imaging results have been reviewed and No results found.  TELE NSR   ASSESSMENT AND PLAN  1. acute on chronic systolic heart failure due to severe mitral insufficiency- still with signs of hypervolemia despite good urine output. Creatinine unchanged. Still roughly 5 kg away from target weight, but will give a day of break to allow K to rebound. Plan metolazone again in AM. 2. Severe mitral insufficiency due to endocarditis related valve perforation - the plan is to perform surgery on this admission, but he  needs optimization of his hemodynamic status first. Possible surgery early next week. 3. staphylococcal endocarditis, also complicated by CNS embolism and secondary foci of infection in his shoulder and spine. Secondary foci of infection have been addressed. Has had 31 days of intravenous antibiotics. Last blood cultures were sterile. Due to his young age he will need a mechanical mitral valve if the valve cannot be repaired surgically. 4. Severe hypertension - avoiding RAAS inhibitors due to volatile renal function, Increase hydralazine further. 5. Acute on chronic renal insufficiency - improved even with diuresis    Thurmon Fair, MD, [facility] [phone] office [phone] pager 02/09/2015 12:09 PM

## 2015-02-09 NOTE — Progress Notes (Signed)
PROGRESS NOTE  Dwayne [name] ZOX:[id number] DOB: July 14, 1980 DOA: 02/02/2015 PCP: No PCP Per Patient  HPI/Recap of past 24 hours: s/p right shoulder washout. Post op day4. Denies CP, SOB and fever. Complaining of pain right shoulder with movement. BP still elevated but better.  Assessment/Plan: Principal Problem:   Acute diastolic CHF (congestive heart failure), NYHA class 3 Active Problems:   Diabetes type 2, uncontrolled   Uncontrolled hypertension   Acute renal failure   Severe mitral regurgitation   Endocarditis   Anasarca   Arthritis, septic, shoulder   Chronic renal insufficiency, stage III (moderate)   Acute renal failure superimposed on stage 3 chronic kidney disease   Bacterial endocarditis   Edema   HTN (hypertension)   Acute on chronic diastolic heart failure   Facial numbness   Vertebral osteomyelitis  1. Acute on chronic diastolic dysfunction EF 60%. 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 Also with diet and meds non-compliance. -continue IV laxis good diuresis after metolazone on 2/9 -continue coreg/imdur/hydralazine, cardiology following.  -low sodium diet, daily weights and strict I's and O's  2. Endocarditis: Continue IV ampicillin dosed by pharmacy for now. Infectious disease consulted; will follow rec's. Has left arm PICC line. Thoracic surgery consulted and tentatively plan is for surgery on 02/14/15.  3. Right shoulder septic arthritis.  -Mri shoulder on 2/5 concerning for septic arthritis/osteo? [facility] Dr. Lajoyce Corners consulted. -2/6 patient was brought to the OR and had: Right shoulder ARTHROSCOPY SHOULDER Extensive debridement of the glenohumeral joint. Subacromial decompression.  -Orthopedic service to help with documentation of depth, type and instrumentation used.  4. Septic embolic infarcts: due to c/o facial numbness on 2/7, mri brain repeated, results showed: While several of these foci appear to be of slightly different  ages, these are new relative to prior MRI from 01/14/2015, where the previously seen infarcts have resolved/normalized. No significant mass effect or parenchymal hemorrhage.  -continue IV antibiotics and hopefully surgery to fix his MV soon  5. Dental caries. Multiple teeth removed one week ago prior to this admission. -Still requiring prn pain meds and unable to chew -will continue dysphagia 3 diet  6. DM type II. Last A1c was above 8.  -will adjust as needed -continue SSI and will add levemir 10 units QHS  7. Essential hypertension.  -Remains Uncontrolled, but better.  -following cardiology rec's will increase hydralazine and norvasc; continue coreg to  BID. Will also continue imdur -low sodium diet encourage -on high dose of diuretics IV  8. Chronic kidney disease stage IV. Baseline creatinine around 2.  -Currently at or better than baseline. -will monitor while on IV diuretics -1.67 (2/10)   Code Status: full  Family Communication: patient and wife at bedside  Disposition Plan: remain inpatient   Consultants:  Cardiology/cardiothoracic surgery/infectious disease/ortho  Procedures: 2/6 Right shoulder joint ARTHROSCOPY SHOULDER  Extensive debridement of the glenohumeral joint. Subacromial decompression.  Antibiotics:  ampicillin   Objective: BP 166/78 mmHg  Pulse 91  Temp(Src) 98.7 F (37.1 C) (Oral)  Resp 12  Ht  (1.88 m)  Wt 116 kg (255 lb 11.7 oz)  BMI 32.82 kg/m2  SpO2 100%  Intake/Output Summary (Last 24 hours) at 02/09/15 1406 Last data filed at 02/09/15 0000  Gross per 24 hour  Intake    120 ml  Output   3325 ml  Net  -3205 ml   Filed Weights   02/08/15 0400 02/08/15 1040 02/09/15 0400  Weight: 119.3 kg (263 lb 0.1 oz) 115.758  kg (255 lb 3.2 oz) 116 kg (255 lb 11.7 oz)    Exam:   General:  Aaox3, NAD; denies CP. BP still elevated.  Cardiovascular: RRR, soft systolic murmur appreciated on exam, no gallops  Respiratory:  decreased at bases, no wheezing and no rhonchi  Abdomen: soft/NT/ND, positive bowel sounds  Musculoskeletal: pitting edema bilateral, right shoulder dressing intact/decrease range of motion and pain with movement.  Data Reviewed: Basic Metabolic Panel:  Recent Labs Lab 02/03/15 0550 02/04/15 0545 02/05/15 0500 02/06/15 0500 02/07/15 0543 02/08/15 0500  NA 139 141  --  137 138 138  K 3.5 3.6  --  3.7 3.7 3.3*  CL 107 106  --  104 105 102  CO2 28 27  --  30 29 27   GLUCOSE 126* 142*  --  159* 154* 182*  BUN 13 16  --  15 18 18   CREATININE 1.78* 1.81*  --  1.67* 1.80* 1.67*  CALCIUM 7.8* 7.8*  --  7.9* 8.3* 8.2*  MG  --  1.9 1.8 1.8 1.9 1.9   CBC:  Recent Labs Lab 02/03/15 0550 02/04/15 0545 02/05/15 0500 02/06/15 0500  WBC 5.8 4.6 4.7 6.3  HGB 8.2* 8.1* 8.1* 8.2*  HCT 24.9* 25.2* 25.3* 25.3*  MCV 85.0 87.5 85.8 86.1  PLT 285 292 318 330   BNP (last 3 results)  Recent Labs  01/17/15 0535 01/22/15 0347 02/02/15 1035  BNP 315.1* 218.9* 629.0*   CBG:  Recent Labs Lab 02/08/15 1129 02/08/15 1618 02/08/15 2331 02/09/15 0742 02/09/15 1138  GLUCAP 241* 205* 199* 176* 265*    Recent Results (from the past 240 hour(s))  Culture, blood (routine x 2)     Status: None   Collection Time: 02/02/15  3:53 PM  Result Value Ref Range Status   Specimen Description BLOOD PICC LINE  Final   Special Requests BOTTLES DRAWN AEROBIC AND ANAEROBIC 5ML  Final   Culture   Final    NO GROWTH 5 DAYS [facility]formed at [facility]    Report Status 02/08/2015 FINAL  Final  Culture, blood (routine x 2)     Status: None   Collection Time: 02/02/15  4:32 PM  Result Value Ref Range Status   Specimen Description BLOOD PICC LINE  Final   Special Requests BOTTLES DRAWN AEROBIC AND ANAEROBIC 10CC  Final   Culture   Final    NO GROWTH 5 DAYS Performed at [facility]    Report Status 02/08/2015 FINAL  Final  MRSA PCR Screening     Status: None   Collection Time:  02/02/15  5:14 PM  Result Value Ref Range Status   MRSA by PCR NEGATIVE NEGATIVE Final    Comment:        The GeneXpert MRSA Assay (FDA approved for NASAL specimens only), is one component of a comprehensive MRSA colonization surveillance program. It is not intended to diagnose MRSA infection nor to guide or monitor treatment for MRSA infections.   Body fluid culture     Status: None   Collection Time: 02/04/15  5:45 PM  Result Value Ref Range Status   Specimen Description SYNOVIAL FLUID SHOULDER RIGHT  Final   Special Requests NONE  Final   Gram Stain   Final    FEW WBC PRESENT, PREDOMINANTLY PMN NO ORGANISMS SEEN Performed at [facility]    Culture   Final    NO GROWTH 3 DAYS Performed at [facility]    Report Status  02/08/2015 FINAL  Final  Surgical pcr screen     Status: None   Collection Time: 02/05/15  5:06 AM  Result Value Ref Range Status   MRSA, PCR NEGATIVE NEGATIVE Final   Staphylococcus aureus NEGATIVE NEGATIVE Final    Comment:        The Xpert SA Assay (FDA approved for NASAL specimens in patients over 90 years of age), is one component of a comprehensive surveillance program.  Test performance has been validated by [facility] for patients greater than or equal to 90 year old. It is not intended to diagnose infection nor to guide or monitor treatment.      Studies: No results found.  Scheduled Meds: . [START ON 02/10/2015] amLODipine  10 mg Oral Daily  . ampicillin (OMNIPEN) IV  2 g Intravenous Q4H  . carvedilol  50 mg Oral BID WC  . docusate sodium  100 mg Oral BID  . feeding supplement (ENSURE COMPLETE)  237 mL Oral TID WC  . furosemide  120 mg Intravenous BID  . hydrALAZINE  150 mg Oral 3 times per day  . Influenza vac split quadrivalent PF  0.5 mL Intramuscular Tomorrow-1000  . insulin aspart  0-9 Units Subcutaneous TID WC  . insulin detemir  10 Units Subcutaneous QHS  . isosorbide mononitrate  60 mg Oral BID  .  [START ON 02/10/2015] metolazone  2.5 mg Oral Q24H  . potassium chloride  20 mEq Oral TID  . sodium chloride  3 mL Intravenous Q12H    Continuous Infusions: . sodium chloride 10 mL/hr at 02/07/15 0132    Time: 30 minutes   Vassie Loll  [facility] Pager [phone]. If 7PM-7AM, please contact night-coverage at [email or web address], password [facility] 02/09/2015, 2:06 PM  LOS: 7 days

## 2015-02-09 NOTE — Progress Notes (Signed)
TCTS BRIEF PROGRESS NOTE   Patient reports feeling better today although he's currently trying to have bowel movement Will follow up again in am Tentatively planning mitral valve repair/replacement on Tuesday 2/16 depending on progress  Martie Muhlbauer H 02/09/2015 7:32 PM

## 2015-02-09 NOTE — Progress Notes (Signed)
Occupational Therapy Treatment Patient Details Name: Dwayne HickmanBenjamin [name] MRN: [id number] DOB: 12/30/1980 Today's Date: 02/09/2015    History of present illness Dwayne HickmanBenjamin [name]  is a 35 y.o. 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 He was planned to undergo open heart surgery for valve replacement in 4-6 weeks. admitted with chest pain, anasarca, scrotal edema, and R shoulder septic arthritis; Underwent R shoulder I& D and subacromial decompression on 2/6   OT comments  Session focused on right UE exercises. Pt lethargic and in a lot of pain. Gave pt another exercise handout.  Follow Up Recommendations  Home health OT;Supervision/Assistance - 24 hour    Equipment Recommendations  Tub/shower seat    Recommendations for Other Services      Precautions / Restrictions Precautions Precautions: Fall;Other (comment) (scrotal edema) Restrictions Weight Bearing Restrictions: No       Mobility Bed Mobility              General bed mobility comments: not assessed  Transfers      General transfer comment: not assessed        ADL                                         General ADL Comments: Educated on UB dressing technique and discussed UB clothing that may be easier to manage.       Vision                     Perception     Praxis      Cognition  Lethargic Behavior During Therapy: WFL for tasks assessed/performed Overall Cognitive Status: Within Functional Limits for tasks assessed                       Extremity/Trunk Assessment               Exercises Other Exercises Other  Exercises: Rt elbow flexion/extension (approximately 10 reps of these), Rt wrist flexion/extension, Rt forearm supination/pronation; pt also performed a few right shoulder rolls Other Exercises: Rt AAROM shoulder flexion/extension and A/AAROM external/internal rotation (performed with and without shoehorn)   Shoulder Instructions       General Comments      Pertinent Vitals/ Pain       Pain Assessment: 0-10 Pain Score: 8  Pain Location: Rt UE Pain Descriptors / Indicators: Throbbing Pain Intervention(s): Limited activity within patient's tolerance;Patient requesting pain meds-RN notified  Home Living                                          Prior Functioning/Environment              Frequency Min 3X/week     Progress Toward Goals  OT Goals(current goals can now be found in the care plan section)  Progress towards OT goals: Progressing toward goals  Acute Rehab OT Goals Patient Stated Goal: not stated OT Goal Formulation: With patient Time For Goal Achievement:  02/21/15 Potential to Achieve Goals: Good ADL Goals Pt Will Perform Upper Body Bathing: with supervision;with caregiver independent in assisting;sitting Pt Will Perform Upper Body Dressing: with supervision;sitting;with caregiver independent in assisting Pt Will Transfer to Toilet: with modified independence;ambulating Pt/caregiver will Perform Home Exercise Program: Right Upper extremity;Increased ROM;With Supervision;With written HEP provided Additional ADL Goal #1: Pt will demonstrate  increase in shoulder flexion to 90 supine - AAROM; ER to 30 with dowel/AAROM  Plan Discharge plan remains appropriate    Co-evaluation                 End of Session     Activity Tolerance Patient limited by pain;Patient limited by lethargy   Patient Left in bed;with call bell/phone within reach   Nurse Communication Patient requests pain meds (pt sleepy and cold)        Time: 1610-9604 OT  Time Calculation (min): 17 min  Charges: OT General Charges $OT Visit: 1 Procedure OT Treatments $Therapeutic Exercise: 8-22 mins  Earlie Raveling OTR/L [phone] 02/09/2015, 3:50 PM

## 2015-02-10 LAB — CBC
HEMATOCRIT: 26.7 % — AB (ref 39.0–52.0)
HEMOGLOBIN: 8.7 g/dL — AB (ref 13.0–17.0)
MCH: 28.7 pg (ref 26.0–34.0)
MCHC: 32.6 g/dL (ref 30.0–36.0)
MCV: 88.1 fL (ref 78.0–100.0)
Platelets: 294 10*3/uL (ref 150–400)
RBC: 3.03 MIL/uL — ABNORMAL LOW (ref 4.22–5.81)
RDW: 14 % (ref 11.5–15.5)
WBC: 5.4 10*3/uL (ref 4.0–10.5)

## 2015-02-10 LAB — BASIC METABOLIC PANEL
Anion gap: 5 (ref 5–15)
BUN: 28 mg/dL — AB (ref 6–23)
CALCIUM: 8.4 mg/dL (ref 8.4–10.5)
CO2: 32 mmol/L (ref 19–32)
Chloride: 101 mmol/L (ref 96–112)
Creatinine, Ser: 2.14 mg/dL — ABNORMAL HIGH (ref 0.50–1.35)
GFR calc Af Amer: 45 mL/min — ABNORMAL LOW (ref >90)
GFR calc non Af Amer: 39 mL/min — ABNORMAL LOW (ref >90)
GLUCOSE: 183 mg/dL — AB (ref 70–99)
Potassium: 4 mmol/L (ref 3.5–5.1)
Sodium: 138 mmol/L (ref 135–145)

## 2015-02-10 LAB — GLUCOSE, CAPILLARY
GLUCOSE-CAPILLARY: 281 mg/dL — AB (ref 70–99)
Glucose-Capillary: 158 mg/dL — ABNORMAL HIGH (ref 70–99)
Glucose-Capillary: 226 mg/dL — ABNORMAL HIGH (ref 70–99)

## 2015-02-10 LAB — CBC WITH DIFFERENTIAL/PLATELET
Basophils Absolute: 0 10*3/uL (ref 0.0–0.1)
Basophils Relative: 0 % (ref 0–1)
Eosinophils Absolute: 0.2 10*3/uL (ref 0.0–0.7)
Eosinophils Relative: 4 % (ref 0–5)
HCT: 23.9 % — ABNORMAL LOW (ref 39.0–52.0)
Hemoglobin: 7.8 g/dL — ABNORMAL LOW (ref 13.0–17.0)
Lymphocytes Relative: 23 % (ref 12–46)
Lymphs Abs: 1.2 10*3/uL (ref 0.7–4.0)
MCH: 28.6 pg (ref 26.0–34.0)
MCHC: 32.6 g/dL (ref 30.0–36.0)
MCV: 87.5 fL (ref 78.0–100.0)
MONO ABS: 0.4 10*3/uL (ref 0.1–1.0)
MONOS PCT: 7 % (ref 3–12)
NEUTROS PCT: 66 % (ref 43–77)
Neutro Abs: 3.3 10*3/uL (ref 1.7–7.7)
PLATELETS: 287 10*3/uL (ref 150–400)
RBC: 2.73 MIL/uL — ABNORMAL LOW (ref 4.22–5.81)
RDW: 14.3 % (ref 11.5–15.5)
WBC: 5 10*3/uL (ref 4.0–10.5)

## 2015-02-10 LAB — MAGNESIUM: MAGNESIUM: 2.1 mg/dL (ref 1.5–2.5)

## 2015-02-10 LAB — PREPARE RBC (CROSSMATCH)

## 2015-02-10 MED ORDER — SODIUM CHLORIDE 0.9 % IV SOLN
Freq: Once | INTRAVENOUS | Status: AC
  Administered 2015-02-10: 15:00:00 via INTRAVENOUS

## 2015-02-10 MED ORDER — HYDRALAZINE HCL 50 MG PO TABS
100.0000 mg | ORAL_TABLET | Freq: Three times a day (TID) | ORAL | Status: DC
  Administered 2015-02-10 – 2015-02-14 (×11): 100 mg via ORAL
  Filled 2015-02-10 (×11): qty 2

## 2015-02-10 MED ORDER — POLYETHYLENE GLYCOL 3350 17 G PO PACK
17.0000 g | PACK | Freq: Every day | ORAL | Status: DC
  Administered 2015-02-10 – 2015-02-12 (×3): 17 g via ORAL
  Filled 2015-02-10 (×3): qty 1

## 2015-02-10 MED ORDER — CLONIDINE HCL 0.2 MG PO TABS
0.2000 mg | ORAL_TABLET | Freq: Two times a day (BID) | ORAL | Status: DC
  Administered 2015-02-10 – 2015-02-17 (×16): 0.2 mg via ORAL
  Filled 2015-02-10 (×17): qty 1

## 2015-02-10 MED ORDER — FUROSEMIDE 10 MG/ML IJ SOLN
20.0000 mg | Freq: Once | INTRAMUSCULAR | Status: AC
  Administered 2015-02-10: 20 mg via INTRAVENOUS
  Filled 2015-02-10: qty 2

## 2015-02-10 MED ORDER — FUROSEMIDE 80 MG PO TABS
80.0000 mg | ORAL_TABLET | Freq: Every day | ORAL | Status: DC
  Administered 2015-02-11 – 2015-02-12 (×2): 80 mg via ORAL
  Filled 2015-02-10 (×2): qty 1

## 2015-02-10 NOTE — Progress Notes (Signed)
[facility]    Subjective:   No more chills today Antibiotics:  Anti-infectives    Start     Dose/Rate Route Frequency Ordered Stop   02/02/15 1700  ampicillin (OMNIPEN) 2 g in sodium chloride 0.9 % 50 mL IVPB     2 g 150 mL/hr over 20 Minutes Intravenous Every 4 hours 02/02/15 1638        Medications: Scheduled Meds: . sodium chloride   Intravenous Once  . amLODipine  10 mg Oral Daily  . ampicillin (OMNIPEN) IV  2 g Intravenous Q4H  . carvedilol  50 mg Oral BID WC  . cloNIDine  0.2 mg Oral BID  . docusate sodium  100 mg Oral BID  . feeding supplement (ENSURE COMPLETE)  237 mL Oral TID WC  . furosemide  20 mg Intravenous Once  . [START ON 02/11/2015] furosemide  80 mg Oral Daily  . hydrALAZINE  150 mg Oral 3 times per day  . Influenza vac split quadrivalent PF  0.5 mL Intramuscular Tomorrow-1000  . insulin aspart  0-9 Units Subcutaneous TID WC  . insulin detemir  10 Units Subcutaneous QHS  . isosorbide mononitrate  60 mg Oral BID  . polyethylene glycol  17 g Oral Daily  . sodium chloride  3 mL Intravenous Q12H   Continuous Infusions: . sodium chloride 10 mL/hr at 02/07/15 0132   PRN Meds:.acetaminophen, alum & mag hydroxide-simeth, bisacodyl, guaiFENesin-dextromethorphan, hydrALAZINE, magnesium hydroxide, methocarbamol **OR** methocarbamol (ROBAXIN)  IV, metoCLOPramide **OR** metoCLOPramide (REGLAN) injection, ondansetron **OR** ondansetron (ZOFRAN) IV, ondansetron **OR** ondansetron (ZOFRAN) IV, oxyCODONE-acetaminophen, senna-docusate    Objective: Weight change: -5 lb 9.6 oz (-2.54 kg)  Intake/Output Summary (Last 24 hours) at 02/10/15 1428 Last data filed at 02/10/15 0831  Gross per 24 hour  Intake   1150 ml  Output   2375 ml  Net  -1225 ml   Blood pressure 133/62, pulse 81, temperature 98.6 F (37 C), temperature source Oral, resp. rate 18, height  (1.88 m), weight 249 lb 9.6 oz (113.218 kg), SpO2 100 %. Temp:  [98.5 F  (36.9 C)-99 F (37.2 C)] 98.6 F (37 C) (02/11 1113) Pulse Rate:  [80-93] 81 (02/11 1113) Resp:  [18-19] 18 (02/11 1113) BP: (133-200)/(62-110) 133/62 mmHg (02/11 1307) SpO2:  [97 %-100 %] 100 % (02/11 1113) Weight:  [249 lb 9.6 oz (113.218 kg)] 249 lb 9.6 oz (113.218 kg) (02/11 0400)  Physical Exam: General: aox3  Having some apparent rigors though was afebrile CV: Systolic murmur loudest at PMI with radiation to axilla Pulm: clear, no wheezes or rhonchi  Shoulder: still tender and has limited range of motion Neuro; non focal CBC:   CBC Latest Ref Rng 02/10/2015 02/06/2015 02/05/2015  WBC 4.0 - 10.5 K/uL 5.0 6.3 4.7  Hemoglobin 13.0 - 17.0 g/dL 7.8(L) 8.2(L) 8.1(L)  Hematocrit 39.0 - 52.0 % 23.9(L) 25.3(L) 25.3(L)  Platelets 150 - 400 K/uL 287 330 318     BMET  Recent Labs  02/09/15 1305 02/10/15 0520  NA 137 138  K 3.7 4.0  CL 99 101  CO2 27 32  GLUCOSE 284* 183*  BUN 21 28*  CREATININE 1.96* 2.14*  CALCIUM 8.2* 8.4     Liver Panel  No results for input(s): PROT, ALBUMIN, AST, ALT, ALKPHOS, BILITOT, BILIDIR, IBILI in the last 72 hours.     Sedimentation Rate No results for input(s): ESRSEDRATE in the last 72 hours. C-Reactive Protein No results for input(s): CRP in the  last 72 hours.  Micro Results: Recent Results (from the past 720 hour(s))  Culture, blood (routine x 2)     Status: None   Collection Time: 01/13/15  7:20 PM  Result Value Ref Range Status   Specimen Description BLOOD RIGHT ARM  Final   Special Requests BOTTLES DRAWN AEROBIC AND ANAEROBIC 5CC  Final   Culture   Final    NO GROWTH 5 DAYS Performed at [facility]    Report Status 01/20/2015 FINAL  Final  Culture, blood (routine x 2)     Status: None   Collection Time: 01/13/15  7:20 PM  Result Value Ref Range Status   Specimen Description BLOOD RIGHT ARM  Final   Special Requests BOTTLES DRAWN AEROBIC AND ANAEROBIC 3CC  Final   Culture   Final    NO GROWTH 5 DAYS Performed  at [facility]    Report Status 01/20/2015 FINAL  Final  Body fluid culture     Status: None   Collection Time: 01/14/15  9:02 AM  Result Value Ref Range Status   Specimen Description FLUID RIGHT SHOULDER SYNOVIAL  Final   Special Requests NONE  Final   Gram Stain   Final    FEW WBC PRESENT,BOTH PMN AND MONONUCLEAR NO ORGANISMS SEEN Performed at [facility]    Culture   Final    NO GROWTH 3 DAYS Performed at [facility]    Report Status 01/17/2015 FINAL  Final  Culture, blood (routine x 2)     Status: None   Collection Time: 01/18/15  6:10 PM  Result Value Ref Range Status   Specimen Description BLOOD LEFT ARM  Final   Special Requests BOTTLES DRAWN AEROBIC AND ANAEROBIC 10CC  Final   Culture   Final    NO GROWTH 5 DAYS Performed at [facility]    Report Status 01/25/2015 FINAL  Final  Culture, blood (routine x 2)     Status: None   Collection Time: 01/18/15  6:18 PM  Result Value Ref Range Status   Specimen Description BLOOD RIGHT ARM  Final   Special Requests BOTTLES DRAWN AEROBIC AND ANAEROBIC 10CC  Final   Culture   Final    NO GROWTH 5 DAYS Performed at [facility]    Report Status 01/25/2015 FINAL  Final  Surgical pcr screen     Status: None   Collection Time: 01/25/15  4:47 AM  Result Value Ref Range Status   MRSA, PCR NEGATIVE NEGATIVE Final   Staphylococcus aureus NEGATIVE NEGATIVE Final    Comment:        The Xpert SA Assay (FDA approved for NASAL specimens in patients over 35 years of age), is one component of a comprehensive surveillance program.  Test performance has been validated by [facility] for patients greater than or equal to 35 year old. It is not intended to diagnose infection nor to guide or monitor treatment.   Culture, blood (routine x 2)     Status: None   Collection Time: 02/02/15  3:53 PM  Result Value Ref Range Status   Specimen Description BLOOD PICC LINE  Final   Special Requests  BOTTLES DRAWN AEROBIC AND ANAEROBIC 5ML  Final   Culture   Final    NO GROWTH 5 DAYS Performed at [facility]    Report Status 02/08/2015 FINAL  Final  Culture, blood (routine x 2)     Status: None   Collection Time:  02/02/15  4:32 PM  Result Value Ref Range Status   Specimen Description BLOOD PICC LINE  Final   Special Requests BOTTLES DRAWN AEROBIC AND ANAEROBIC 10CC  Final   Culture   Final    NO GROWTH 5 DAYS Performed at [facility]    Report Status 02/08/2015 FINAL  Final  MRSA PCR Screening     Status: None   Collection Time: 02/02/15  5:14 PM  Result Value Ref Range Status   MRSA by PCR NEGATIVE NEGATIVE Final    Comment:        The GeneXpert MRSA Assay (FDA approved for NASAL specimens only), is one component of a comprehensive MRSA colonization surveillance program. It is not intended to diagnose MRSA infection nor to guide or monitor treatment for MRSA infections.   Body fluid culture     Status: None   Collection Time: 02/04/15  5:45 PM  Result Value Ref Range Status   Specimen Description SYNOVIAL FLUID SHOULDER RIGHT  Final   Special Requests NONE  Final   Gram Stain   Final    FEW WBC PRESENT, PREDOMINANTLY PMN NO ORGANISMS SEEN Performed at [facility]    Culture   Final    NO GROWTH 3 DAYS Performed at [facility]    Report Status 02/08/2015 FINAL  Final  Surgical pcr screen     Status: None   Collection Time: 02/05/15  5:06 AM  Result Value Ref Range Status   MRSA, PCR NEGATIVE NEGATIVE Final   Staphylococcus aureus NEGATIVE NEGATIVE Final    Comment:        The Xpert SA Assay (FDA approved for NASAL specimens in patients over 34 years of age), is one component of a comprehensive surveillance program.  Test performance has been validated by [facility] for patients greater than or equal to 22 year old. It is not intended to diagnose infection nor to guide or monitor treatment.      Studies/Results: No results found.    Assessment/Plan:  Principal Problem:   Acute diastolic CHF (congestive heart failure), NYHA class 3 Active Problems:   Diabetes type 2, uncontrolled   Uncontrolled hypertension   Acute renal failure   Severe mitral regurgitation   Endocarditis   Anasarca   Arthritis, septic, shoulder   Chronic renal insufficiency, stage III (moderate)   Acute renal failure superimposed on stage 3 chronic kidney disease   Bacterial endocarditis   Edema   HTN (hypertension)   Acute on chronic diastolic heart failure   Facial numbness   Vertebral osteomyelitis   Septic arthritis of shoulder, right    Dwayne [name] is a 35 y.o. 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 with perforation severe mitral regurgitation, septic emboli to the brain right septic shoulder and thoracic spine discitis sp I and D shoulder, now with MRI showing new septic emboli to brain on effective antibiotics therapy  --PRESENCE OF NEW EMBOLI IN THE BRAIN WHILE ON PROPER ANTIBIOTICS IS AN INDICATION FOR MORE URGENT VALVE REPLACEMENT  --I WOULD FAVOR VALVE REPLACEMENT AS SOON AS POSSIBLE AS I HAVE ANXIETY ABOUT PT HAVING A POTENTIALLY  DISABLING EMBOLISM TO HIS CNS   --CONTINUE  high dose AMPICILLIN  IF HE HAS FEVERS, NEED REPEAT BLOOD CULTURES, BOTHERED BY HIM HAVING RIGORS TODAY  Greatly appreciate Dr. Lajoyce Corners performing I and D of shoulder which should help [place] control this infection. Again I worry that he needs valve surgery more urgently now, I would  consider asking Dr. Lajoyce Corners to come and reexamine patient versus repeat imaging the shoulder again if pain persists over the next week.    #2 Septic arthritis with osteomyelitis: sp I and D by Dr. Lajoyce Corners Will consider re-imaging shoulder down the road to reassess bone, and perhaps do this sooner if pain still not improving much  #3 Vertebral osteomyelitis, diskitis: he has NO new pain in the back, in fact no pain at all  I would consider repeat MRI 2 months after the last one to ensure this is progressing at appropriate pace (keeping in mind that imaging can lag behind clinical progress here)  I do not see a clinical reason to get an MRI of spine at this point, in fact bone and disc can look worse a month into therapy. He never had epidural abscess when originally imaged     LOS: 8 days   Acey Lav 02/10/2015, 2:28 PM

## 2015-02-10 NOTE — Progress Notes (Signed)
Inpatient Diabetes Program Recommendations  AACE/ADA: New Consensus Statement on Inpatient Glycemic Control (2013)  Target Ranges:  Prepandial:   less than 140 mg/dL      Peak postprandial:   less than 180 mg/dL (1-2 hours)      Critically ill patients:  140 - 180 mg/dL  Results for Rob HickmanSTEVENSON, Kaveh (MRN [id number]) as of 02/10/2015 12:11  Ref. Range 02/09/2015 11:38 02/09/2015 16:26 02/09/2015 21:00 02/10/2015 07:32 02/10/2015 11:12  Glucose-Capillary Latest Range: 70-99 mg/dL 409265 (H) 811155 (H) 914257 (H) 158 (H) 226 (H)   Inpatient Diabetes Program Recommendations Insulin - Meal Coverage: add Novolog 4 units TID with meals per Glycemic Control order set  Thank you  Piedad ClimesGina Cheskel Silverio BSN, RN,CDE Inpatient Diabetes Coordinator [phone] (team pager)

## 2015-02-10 NOTE — Progress Notes (Signed)
Pt's wife continues to bring high calorie food from outside.They have been educated by physician and nurse about the necessity to cut down on food intake but they would not comply. The wife stated " I will not let him starve, the food tray is not enough for him". Will continue to provide further education.  Colleen Canesar Amayia Ciano, RN

## 2015-02-10 NOTE — Progress Notes (Signed)
PROGRESS NOTE  Rob HickmanBenjamin Panozzo ZOX:[id number] DOB: 09/24/1980 DOA: 02/02/2015 PCP: No PCP Per Patient  HPI/Recap of past 24 hours: s/p right shoulder washout. Post op day4. Denies CP, SOB and fever. Complaining of pain right shoulder with movement. BP still elevated but better.  Assessment/Plan: Principal Problem:   Acute diastolic CHF (congestive heart failure), NYHA class 3 Active Problems:   Diabetes type 2, uncontrolled   Uncontrolled hypertension   Acute renal failure   Severe mitral regurgitation   Endocarditis   Anasarca   Arthritis, septic, shoulder   Chronic renal insufficiency, stage III (moderate)   Acute renal failure superimposed on stage 3 chronic kidney disease   Bacterial endocarditis   Edema   HTN (hypertension)   Acute on chronic diastolic heart failure   Facial numbness   Vertebral osteomyelitis   Septic arthritis of shoulder, right  1. Acute on chronic diastolic dysfunction EF 60%. 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 Also with diet and meds non-compliance. -Slight bump in patient's creatinine has actually led cardiology service to change Lasix to 80 mg by mouth starting today (2 or/11) -Will continue coreg/imdur/hydralazine, cardiology following.  -Patient encouraged to follow low sodium diet -Will continue daily weights and strict I's and O's  2. Endocarditis: Continue IV ampicillin dosed by pharmacy for now. Infectious disease consulted; will follow rec's. Has left arm PICC line. Thoracic surgery consulted and tentatively plan is for surgery on 02/15/15. -Continue patient tune up for potential surgery next week  3. Right shoulder septic arthritis.  -Mri shoulder on 2/5 concerning for septic arthritis/osteomyelitis; Dr. Lajoyce Cornersuda consulted. -2/6 patient was brought to the OR and had: Right shoulder ARTHROSCOPY SHOULDER Extensive debridement of the glenohumeral joint. Subacromial decompression.  -Orthopedic service to help with documentation  of depth, type and instrumentation used.  4. Septic embolic infarcts: due to c/o facial numbness on 2/7, mri brain repeated, results showed: While several of these foci appear to be of slightly different ages, these are new relative to prior MRI from 01/14/2015, where the previously seen infarcts have resolved/normalized. No significant mass effect or parenchymal hemorrhage.  -continue IV antibiotics and hopefully surgery to fix his MV soon  5. Dental caries. Multiple teeth removed one week ago prior to this admission. -Still requiring prn pain meds and unable to chew -will continue dysphagia 3 diet  6. DM type II. Last A1c was above 8.  -will adjust as needed -continue SSI and Levemir  7. Essential hypertension.  -Remains Uncontrolled -following cardiology rec's will start clonidine 0.2 mg twice a day -Will continue hydralazine, norvasc, coreg and Imdur  -low sodium diet encourage -Patient on Lasix 80 mg by mouth starting today to 11 as per cardiology recommendations  8. Chronic kidney disease stage IV. Baseline creatinine around 2.  -Currently at baseline. -per cardiology start PO lasix today (80mg  2/11) -2.14 (2/11)  9. Anemia of chronic disease: Hemoglobin slightly drifting down most likely due to multiple consecutive phlebotomies -In anticipation for surgery next week we'll transfuse 1 unit of PRBCs -Follow hemoglobin trend   Code Status: full  Family Communication: patient and wife at bedside  Disposition Plan: remain inpatient; anticipated surgery next week (per Dr. Cornelius Moraswen 2/16)   Consultants:  Cardiology/cardiothoracic surgery/infectious disease/ortho  Procedures: 2/6 Right shoulder joint ARTHROSCOPY SHOULDER  Extensive debridement of the glenohumeral joint. Subacromial decompression.  Antibiotics:  ampicillin   Objective: BP 131/65 mmHg  Pulse 85  Temp(Src) 98.3 F (36.8 C) (Oral)  Resp 20  Ht 6\' 2"  (1.88  m)  Wt 113.218 kg (249 lb 9.6 oz)  BMI  32.03 kg/m2  SpO2 99%  Intake/Output Summary (Last 24 hours) at 02/10/15 1913 Last data filed at 02/10/15 1730  Gross per 24 hour  Intake   1120 ml  Output   1625 ml  Net   -505 ml   Filed Weights   02/08/15 1040 02/09/15 0400 02/10/15 0400  Weight: 115.758 kg (255 lb 3.2 oz) 116 kg (255 lb 11.7 oz) 113.218 kg (249 lb 9.6 oz)    Exam:   General:  AAOx3, NAD; denies CP. BP still elevated. Volume improved.  Cardiovascular: RRR, soft systolic murmur appreciated on exam, no gallops  Respiratory: decreased at bases, no wheezing and no rhonchi  Abdomen: soft/NT/ND, positive bowel sounds  Musculoskeletal: pitting edema bilateral (1-2++), right shoulder dressing intact/decrease range of motion and pain with movement.  Data Reviewed: Basic Metabolic Panel:  Recent Labs Lab 02/05/15 0500 02/06/15 0500 02/07/15 0543 02/08/15 0500 02/09/15 1305 02/10/15 0520  NA  --  137 138 138 137 138  K  --  3.7 3.7 3.3* 3.7 4.0  CL  --  104 105 102 99 101  CO2  --  32  GLUCOSE  --  159* 154* 182* 284* 183*  BUN  --  28*  CREATININE  --  1.67* 1.80* 1.67* 1.96* 2.14*  CALCIUM  --  7.9* 8.3* 8.2* 8.2* 8.4  MG 1.8 1.8 1.9 1.9  --  2.1   CBC:  Recent Labs Lab 02/04/15 0545 02/05/15 0500 02/06/15 0500 02/10/15 0520  WBC 4.6 4.7 6.3 5.0  NEUTROABS  --   --   --  3.3  HGB 8.1* 8.1* 8.2* 7.8*  HCT 25.2* 25.3* 25.3* 23.9*  MCV 87.5 85.8 86.1 87.5  PLT 292 318 330 287   BNP (last 3 results)  Recent Labs  01/17/15 0535 01/22/15 0347 02/02/15 1035  BNP 315.1* 218.9* 629.0*   CBG:  Recent Labs Lab 02/09/15 1138 02/09/15 1626 02/09/15 2100 02/10/15 0732 02/10/15 1112  GLUCAP 265* 155* 257* 158* 226*    Recent Results (from the past 240 hour(s))  Culture, blood (routine x 2)     Status: None   Collection Time: 02/02/15  3:53 PM  Result Value Ref Range Status   Specimen Description BLOOD PICC LINE  Final   Special Requests BOTTLES DRAWN AEROBIC  AND ANAEROBIC  Final   Culture   Final    NO GROWTH 5 DAYS Performed at [facility]    Report Status 02/08/2015 FINAL  Final  Culture, blood (routine x 2)     Status: None   Collection Time: 02/02/15  4:32 PM  Result Value Ref Range Status   Specimen Description BLOOD PICC LINE  Final   Special Requests BOTTLES DRAWN AEROBIC AND ANAEROBIC 10CC  Final   Culture   Final    NO GROWTH 5 DAYS Performed at [facility]    Report Status 02/08/2015 FINAL  Final  MRSA PCR Screening     Status: None   Collection Time: 02/02/15  5:14 PM  Result Value Ref Range Status   MRSA by PCR NEGATIVE NEGATIVE Final    Comment:        The GeneXpert MRSA Assay (FDA approved for NASAL specimens only), is one component of a comprehensive MRSA colonization surveillance program. It is not intended to diagnose MRSA infection nor to guide or monitor treatment for MRSA  infections.   Body fluid culture     Status: None   Collection Time: 02/04/15  5:45 PM  Result Value Ref Range Status   Specimen Description SYNOVIAL FLUID SHOULDER RIGHT  Final   Special Requests NONE  Final   Gram Stain   Final    FEW WBC PRESENT, PREDOMINANTLY PMN NO ORGANISMS SEEN Performed at [facility]    Culture   Final    NO GROWTH 3 DAYS Performed at [facility]    Report Status 02/08/2015 FINAL  Final  Surgical pcr screen     Status: None   Collection Time: 02/05/15  5:06 AM  Result Value Ref Range Status   MRSA, PCR NEGATIVE NEGATIVE Final   Staphylococcus aureus NEGATIVE NEGATIVE Final    Comment:        The Xpert SA Assay (FDA approved for NASAL specimens in patients over 42 years of age), is one component of a comprehensive surveillance program.  Test performance has been validated by [facility] for patients greater than or equal to 61 year old. It is not intended to diagnose infection nor to guide or monitor treatment.      Studies: No results  found.  Scheduled Meds: . amLODipine  10 mg Oral Daily  . ampicillin (OMNIPEN) IV  2 g Intravenous Q4H  . carvedilol  50 mg Oral BID WC  . cloNIDine  0.2 mg Oral BID  . docusate sodium  100 mg Oral BID  . feeding supplement (ENSURE COMPLETE)  237 mL Oral TID WC  . [START ON 02/11/2015] furosemide  80 mg Oral Daily  . hydrALAZINE  100 mg Oral 3 times per day  . Influenza vac split quadrivalent PF  0.5 mL Intramuscular Tomorrow-1000  . insulin aspart  0-9 Units Subcutaneous TID WC  . insulin detemir  10 Units Subcutaneous QHS  . isosorbide mononitrate  60 mg Oral BID  . polyethylene glycol  17 g Oral Daily  . sodium chloride  3 mL Intravenous Q12H    Continuous Infusions: . sodium chloride 10 mL/hr at 02/07/15 0132    Time: 30 minutes   Vassie Loll  [facility] Pager [phone]. If 7PM-7AM, please contact night-coverage at [email or web address], password [facility] 02/10/2015, 7:13 PM  LOS: 8 days

## 2015-02-10 NOTE — Progress Notes (Signed)
Patient Name: Dwayne [name] Date of Encounter: 02/10/2015  Principal Problem:   Acute diastolic CHF (congestive heart failure), NYHA class 3 Active Problems:   Diabetes type 2, uncontrolled   Uncontrolled hypertension   Acute renal failure   Severe mitral regurgitation   Endocarditis   Anasarca   Arthritis, septic, shoulder   Chronic renal insufficiency, stage III (moderate)   Acute renal failure superimposed on stage 3 chronic kidney disease   Bacterial endocarditis   Edema   HTN (hypertension)   Acute on chronic diastolic heart failure   Facial numbness   Vertebral osteomyelitis   Septic arthritis of shoulder, right   Length of Stay: 8  SUBJECTIVE  Good diuresis, substantial weight reduction and resolution of edema. Creatinine bumped up a little bit  CURRENT MEDS . amLODipine  10 mg Oral Daily  . ampicillin (OMNIPEN) IV  2 g Intravenous Q4H  . carvedilol  50 mg Oral BID WC  . docusate sodium  100 mg Oral BID  . feeding supplement (ENSURE COMPLETE)  237 mL Oral TID WC  . furosemide  120 mg Intravenous BID  . hydrALAZINE  150 mg Oral 3 times per day  . Influenza vac split quadrivalent PF  0.5 mL Intramuscular Tomorrow-1000  . insulin aspart  0-9 Units Subcutaneous TID WC  . insulin detemir  10 Units Subcutaneous QHS  . isosorbide mononitrate  60 mg Oral BID  . metolazone  2.5 mg Oral Q24H  . potassium chloride  20 mEq Oral TID  . sodium chloride  3 mL Intravenous Q12H    OBJECTIVE   Intake/Output Summary (Last 24 hours) at 02/10/15 0834 Last data filed at 02/10/15 0831  Gross per 24 hour  Intake   1150 ml  Output   2375 ml  Net  -1225 ml   Filed Weights   02/08/15 1040 02/09/15 0400 02/10/15 0400  Weight: 115.758 kg (255 lb 3.2 oz) 116 kg (255 lb 11.7 oz) 113.218 kg (249 lb 9.6 oz)    PHYSICAL EXAM Filed Vitals:   02/10/15 0400 02/10/15 0710 02/10/15 0732 02/10/15 0812  BP: 142/70 199/91  200/110  Pulse: 93  88 80  Temp: 99 F (37.2 C)  98.6  F (37 C)   TempSrc: Oral  Oral   Resp:   19   Height:  (1.88 m)     Weight: 113.218 kg (249 lb 9.6 oz)     SpO2: 100%  100%    General: Alert, oriented x3, no distress Head: no evidence of trauma, PERRL, EOMI, no exophtalmos or lid lag, no myxedema, no xanthelasma; normal ears, nose and oropharynx Neck: normal jugular venous pulsations and no hepatojugular reflux; brisk carotid pulses without delay and no carotid bruits Chest: clear to auscultation, no signs of consolidation by percussion or palpation, normal fremitus, symmetrical and full respiratory excursions Cardiovascular: normal position and quality of the apical impulse, regular rhythm, normal first and second heart sounds, no rubs or gallops, 2/6 apical holosystolic murmur Abdomen: no tenderness or distention, no masses by palpation, no abnormal pulsatility or arterial bruits, normal bowel sounds, no hepatosplenomegaly Extremities: no clubbing, cyanosis; trivial pedal edema; 2+ radial, ulnar and brachial pulses bilaterally; 2+ right femoral, posterior tibial and dorsalis pedis pulses; 2+ left femoral, posterior tibial and dorsalis pedis pulses; no subclavian or femoral bruits Neurological: grossly nonfocal  LABS  CBC  Recent Labs  02/10/15 0520  WBC 5.0  NEUTROABS 3.3  HGB 7.8*  HCT 23.9*  MCV 87.5  PLT  287   Basic Metabolic Panel  Recent Labs  02/08/15 0500 02/09/15 1305 02/10/15 0520  NA 138 137 138  K 3.3* 3.7 4.0  CL 102 99 101  CO2 27 27 32  GLUCOSE 182* 284* 183*  BUN 18 21 28*  CREATININE 1.67* 1.96* 2.14*  CALCIUM 8.2* 8.2* 8.4  MG 1.9  --  2.1    Radiology Studies Imaging results have been reviewed and No results found.   ASSESSMENT AND PLAN  1. acute on chronic systolic heart failure due to severe mitral insufficiency- now probably at target weight. DC IV diuretics and metolazone.  2. Severe mitral insufficiency due to endocarditis related valve perforation - the plan is to perform  surgery on this admission, but he needs optimization of his hemodynamic status first. Possible surgery early next week. 3. staphylococcal endocarditis, also complicated by CNS embolism and secondary foci of infection in his shoulder and spine. Secondary foci of infection have been addressed. Has had 32 days of intravenous antibiotics. Last blood cultures were sterile. Due to his young age he will need a mechanical mitral valve if the valve cannot be repaired surgically. 4. Severe hypertension - avoiding RAAS inhibitors due to volatile renal function. Consider amlodipine 5. Acute on chronic renal insufficiency - monitor closely, reduce diuretics   Thurmon FairMihai Reona Zendejas, MD, [facility] Center[facility]ACC [facility] [phone] office [phone] pager 02/10/2015 8:34 AM

## 2015-02-10 NOTE — Progress Notes (Addendum)
      [address]       [place] [id number]             [phone]     CARDIOTHORACIC SURGERY PROGRESS NOTE  5 Days Post-Op  S/P Procedure(s) (LRB): ARTHROSCOPY SHOULDER (Right)  Subjective: Feels okay.  Complains of pain in right shoulder and constipation.  Denies SOB.   Objective: Vital signs in last 24 hours: Temp:  [98.5 F (36.9 C)-99 F (37.2 C)] 98.6 F (37 C) (02/11 0732) Pulse Rate:  [80-93] 80 (02/11 0812) Cardiac Rhythm:  [-] Normal sinus rhythm (02/10 2135) Resp:  [19] 19 (02/11 0732) BP: (142-200)/(70-110) 200/110 mmHg (02/11 0812) SpO2:  [97 %-100 %] 100 % (02/11 0732) Weight:  [113.218 kg (249 lb 9.6 oz)] 113.218 kg (249 lb 9.6 oz) (02/11 0400)  Physical Exam:  Rhythm:   sinus  Breath sounds: Few bibasilar inspiratory crackles  Heart sounds:  RRR w/ systolic murmur  Incisions:  Shoulder dressings dry, intact  Abdomen:  Soft, non-distended, non-tender  Extremities:  Warm, well-perfused    Intake/Output from previous day: 02/10 0701 - 02/11 0700 In: 910 [P.O.:840; I.V.:20; IV Piggyback:50] Out: 2375 [Urine:2375] Intake/Output this shift: Total I/O In: 240 [P.O.:240] Out: -   Lab Results:  Recent Labs  02/10/15 0520  WBC 5.0  HGB 7.8*  HCT 23.9*  PLT 287   BMET:  Recent Labs  02/09/15 1305 02/10/15 0520  NA 137 138  K 3.7 4.0  CL 99 101  CO2 27 32  GLUCOSE 284* 183*  BUN 21 28*  CREATININE 1.96* 2.14*  CALCIUM 8.2* 8.4    CBG (last 3)   Recent Labs  02/09/15 1626 02/09/15 2100 02/10/15 0732  GLUCAP 155* 257* 158*   PT/INR:  No results for input(s): LABPROT, INR in the last 72 hours.  CXR:  N/A  Assessment/Plan: S/P Procedure(s) (LRB): ARTHROSCOPY SHOULDER (Right)  Patient remains afebrile w/ normal WBC and platelet count.  No signs of overwhelming infection.  Shoulder remains painful.  CHF improved and weight approaching baseline.  Creatinine up somewhat although not as high as it had been previously.   Hgb continues to drift down.  I agree w/ plans outlined per Dr Royann Shiversroitoru.  Will tentatively plan for mitral valve repair or replacement on Tuesday 2/16 as long as patient remains stable and peripheral sites of infection are under adequate control.  Given his somewhat tenuous status he might benefit from transfusion PRBC's.  I have discussed the patient's current condition and the serious nature of his illness at length with the patient again today.   We discussed our tentative plans for surgery.  He seems to have a very limited understanding.  I have offered to meet with his girlfriend and family if they would be willing to arrange a time for consultation.  I spent in excess of 15 minutes during the conduct of this hospital encounter and >50% of this time involved direct face-to-face encounter with the patient for counseling and/or coordination of their care.   Rukia Mcgillivray H 02/10/2015 9:10 AM

## 2015-02-10 NOTE — Progress Notes (Signed)
ANTIBIOTIC CONSULT NOTE - FOLLOW UP  Pharmacy Consult for Ampicillin Indication: endocarditis  No Known Allergies  Patient Measurements: Height:  (188 cm) Weight: 249 lb 9.6 oz (113.218 kg) IBW/kg (Calculated) : 82.2 Adjusted Body Weight:    Vital Signs: Temp: 98.6 F (37 C) (02/11 0732) Temp Source: Oral (02/11 0732) BP: 200/110 mmHg (02/11 0812) Pulse Rate: 80 (02/11 0812) Intake/Output from previous day: 02/10 0701 - 02/11 0700 In: 910 [P.O.:840; I.V.:20; IV Piggyback:50] Out: 2375 [Urine:2375] Intake/Output from this shift: Total I/O In: 240 [P.O.:240] Out: -   Labs:  Recent Labs  02/08/15 0500 02/09/15 1305 02/10/15 0520  WBC  --   --  5.0  HGB  --   --  7.8*  PLT  --   --  287  CREATININE 1.67* 1.96* 2.14*   Estimated Creatinine Clearance: 65.1 mL/min (by C-G formula based on Cr of 2.14). No results for input(s): VANCOTROUGH, VANCOPEAK, VANCORANDOM, GENTTROUGH, GENTPEAK, GENTRANDOM, TOBRATROUGH, TOBRAPEAK, TOBRARND, AMIKACINPEAK, AMIKACINTROU, AMIKACIN in the last 72 hours.   Microbiology: Recent Results (from the past 720 hour(s))  Culture, blood (routine x 2)     Status: None   Collection Time: 01/13/15  7:20 PM  Result Value Ref Range Status   Specimen Description BLOOD RIGHT ARM  Final   Special Requests BOTTLES DRAWN AEROBIC AND ANAEROBIC 5CC  Final   Culture   Final    NO GROWTH 5 DAYS Performed at [facility]    Report Status 01/20/2015 FINAL  Final  Culture, blood (routine x 2)     Status: None   Collection Time: 01/13/15  7:20 PM  Result Value Ref Range Status   Specimen Description BLOOD RIGHT ARM  Final   Special Requests BOTTLES DRAWN AEROBIC AND ANAEROBIC 3CC  Final   Culture   Final    NO GROWTH 5 DAYS Performed at [facility]    Report Status 01/20/2015 FINAL  Final  Body fluid culture     Status: None   Collection Time: 01/14/15  9:02 AM  Result Value Ref Range Status   Specimen Description FLUID RIGHT  SHOULDER SYNOVIAL  Final   Special Requests NONE  Final   Gram Stain   Final    FEW WBC PRESENT,BOTH PMN AND MONONUCLEAR NO ORGANISMS SEEN Performed at [facility]    Culture   Final    NO GROWTH 3 DAYS Performed at [facility]    Report Status 01/17/2015 FINAL  Final  Culture, blood (routine x 2)     Status: None   Collection Time: 01/18/15  6:10 PM  Result Value Ref Range Status   Specimen Description BLOOD LEFT ARM  Final   Special Requests BOTTLES DRAWN AEROBIC AND ANAEROBIC 10CC  Final   Culture   Final    NO GROWTH 5 DAYS Performed at [facility]    Report Status 01/25/2015 FINAL  Final  Culture, blood (routine x 2)     Status: None   Collection Time: 01/18/15  6:18 PM  Result Value Ref Range Status   Specimen Description BLOOD RIGHT ARM  Final   Special Requests BOTTLES DRAWN AEROBIC AND ANAEROBIC 10CC  Final   Culture   Final    NO GROWTH 5 DAYS Performed at [facility]    Report Status 01/25/2015 FINAL  Final  Surgical pcr screen     Status: None   Collection Time: 01/25/15  4:47 AM  Result Value Ref Range Status  MRSA, PCR NEGATIVE NEGATIVE Final   Staphylococcus aureus NEGATIVE NEGATIVE Final    Comment:        The Xpert SA Assay (FDA approved for NASAL specimens in patients over 67 years of age), is one component of a comprehensive surveillance program.  Test performance has been validated by [facility] for patients greater than or equal to 64 year old. It is not intended to diagnose infection nor to guide or monitor treatment.   Culture, blood (routine x 2)     Status: None   Collection Time: 02/02/15  3:53 PM  Result Value Ref Range Status   Specimen Description BLOOD PICC LINE  Final   Special Requests BOTTLES DRAWN AEROBIC AND ANAEROBIC  Final   Culture   Final    NO GROWTH 5 DAYS Performed at [facility]    Report Status 02/08/2015 FINAL  Final  Culture, blood (routine x 2)     Status: None    Collection Time: 02/02/15  4:32 PM  Result Value Ref Range Status   Specimen Description BLOOD PICC LINE  Final   Special Requests BOTTLES DRAWN AEROBIC AND ANAEROBIC 10CC  Final   Culture   Final    NO GROWTH 5 DAYS Performed at [facility]    Report Status 02/08/2015 FINAL  Final  MRSA PCR Screening     Status: None   Collection Time: 02/02/15  5:14 PM  Result Value Ref Range Status   MRSA by PCR NEGATIVE NEGATIVE Final    Comment:        The GeneXpert MRSA Assay (FDA approved for NASAL specimens only), is one component of a comprehensive MRSA colonization surveillance program. It is not intended to diagnose MRSA infection nor to guide or monitor treatment for MRSA infections.   Body fluid culture     Status: None   Collection Time: 02/04/15  5:45 PM  Result Value Ref Range Status   Specimen Description SYNOVIAL FLUID SHOULDER RIGHT  Final   Special Requests NONE  Final   Gram Stain   Final    FEW WBC PRESENT, PREDOMINANTLY PMN NO ORGANISMS SEEN Performed at [facility]    Culture   Final    NO GROWTH 3 DAYS Performed at [facility]    Report Status 02/08/2015 FINAL  Final  Surgical pcr screen     Status: None   Collection Time: 02/05/15  5:06 AM  Result Value Ref Range Status   MRSA, PCR NEGATIVE NEGATIVE Final   Staphylococcus aureus NEGATIVE NEGATIVE Final    Comment:        The Xpert SA Assay (FDA approved for NASAL specimens in patients over 29 years of age), is one component of a comprehensive surveillance program.  Test performance has been validated by [facility] for patients greater than or equal to 85 year old. It is not intended to diagnose infection nor to guide or monitor treatment.     Anti-infectives    Start     Dose/Rate Route Frequency Ordered Stop   02/02/15 1700  ampicillin (OMNIPEN) 2 g in sodium chloride 0.9 % 50 mL IVPB     2 g 150 mL/hr over 20 Minutes Intravenous Every 4 hours 02/02/15 1638         Assessment: 35 yo [name] presents on 2/3 with CP. 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 Pharmacy to continue dosing ampicillin for endocarditis. Pt is afebrile and WBC wnl. SCr 1.73,  CrCl ~7380ml/min.  PMH: DM, HTN, ARF, MSSA bacteremia w/ endocarditis  Anticoagulation: none. SCDs  Infectious Disease: Ampicillin for 'Penicillin Sensitive Staphylococcus aureus bacteremia, mitral valve endocarditis with perforation, septic emboli to the brain and right septic shoulder and thoracic spine discitis. Now s/p arthroscopic debridement and subacromial decompression for septic right shoulder. ID did see last admit (1/29) and plans were 6 weeks of ampicillin with day 1 being 1/14. Of note pt was on a once a day ampicillin 12g over 24hrs for ease of administration. I&D of shoulder. Afebrile. All cultures negative.  1/14 ampicillin (6wks)>>  2/3 blood x2>>negative 2/5 Synovial fluid>>negative  Cardiovascular: Acute on chronic diastolic dysfunction EF 60% caused by mitral valve endocarditis and dysfunction. Patient noncompliant with low-Na+ diet. BP 200/110, HR 80. Diuresing. Meds: Norvasc10 added, Coreg 50 BID increased, clonidine, po Lasix, Hydralazine increased 150 q8h, Imdur increased 60BID (missed dose PM 2/9, SZP done), K=  Endocrinology: DM. A1C 7.5. glucose better on SSI. Glucoses 155-265  Neuro: Adamently noncompliant. Has been educated multiple times. MD note: "As always, he is sullen and not very communicative". C/o shoulder pain.  Nephrology: Chronic kidney disease stage IV. Baseline creatinine around 2. SCr= 2.14 up slightly. CrCl 65. I/O -1725. UOP 0.9.  Hematology / Oncology: Hgb low 8.2>7.8.  PTA Medication Issues: insulin 70/30   Plan:  Ampicillin 2g IV q4h Cardiac surgery Tuesday.  Nadea Kirkland S. Merilynn Finlandobertson, PharmD, BCPS Clinical Staff Pharmacist Pager [phone]  Misty Stanleyobertson, Rox Mcgriff Stillinger 02/10/2015,10:51 AM

## 2015-02-10 NOTE — Progress Notes (Signed)
Occupational Therapy Treatment Patient Details Name: Dwayne HickmanBenjamin [name] MRN: [id number] DOB: 09/23/1980 Today's Date: 02/10/2015    History of present illness Dwayne HickmanBenjamin Feld  is a 35 y.o. 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 He was planned to undergo open heart surgery for valve replacement in 4-6 weeks. admitted with chest pain, anasarca, scrotal edema, and R shoulder septic arthritis; Underwent R shoulder I& D and subacromial decompression on 2/6   OT comments  Exercises performed at bed level due to receiving blood. Pt tolerated well. R shoulder FF has increased to 110 degrees AAROM. ER 40. Abd 60. Continue to recommend acute OT to increase functional use of RUE.  Follow Up Recommendations  Home health OT;Supervision/Assistance - 24 hour    Equipment Recommendations  Tub/shower seat    Recommendations for Other Services      Precautions / Restrictions Precautions Precautions: Fall;Other (comment)       Mobility Bed Mobility  mod I                                                              ADL      Using R UE more in functional tasks. Able to use R hand to tie cord on shorts and manipulate phone with R hand. Encouraged pt to use RUE as much as possible.                                                                          Cognition   Behavior During Therapy: WFL for tasks assessed/performed Overall Cognitive Status: No family/caregiver present to determine baseline cognitive functioning                       Extremity/Trunk Assessment   RUE  progressing. Limits with shoulder movements, but improved. AROM shoulder FF @ 45. AAROM 110            Exercises Shoulder Exercises Shoulder Flexion: AAROM;Right;10 reps;Supine (able to achieve 110 FF) Shoulder ABduction: AAROM;Right;10 reps;Supine (60 degrees) Shoulder External Rotation: AAROM;Right;10 reps;Supine (40 degrees) Elbow Flexion: AROM;Right;10 reps;Supine Elbow Extension: AROM;Right;10 reps;Supine Wrist Flexion: AROM;Right;10 reps Wrist Extension: AROM;Right;10 reps Digit Composite Flexion: AROM;Right;10 reps Composite Extension: AROM;Right;10 reps   Shoulder Instructions       General Comments      Pertinent Vitals/ Pain       Pain Assessment: Faces Faces Pain Scale: Hurts even more Pain Location: R shoulder with FF Pain Descriptors / Indicators: Stabbing Pain Intervention(s): Monitored during session;Repositioned;Limited activity within patient's tolerance;Patient requesting pain meds-RN notified  Home Living  Prior Functioning/Environment              Frequency Min 3X/week     Progress Toward Goals  OT Goals(current goals can now be found in the care plan section)  Progress towards OT goals: Progressing toward goals  Acute Rehab OT Goals Patient Stated Goal: to get better OT Goal Formulation: With patient Time For Goal Achievement: 02/21/15 Potential to Achieve Goals: Good ADL Goals Pt Will Perform Upper Body Bathing: with supervision;with caregiver independent in assisting;sitting Pt Will Perform Upper Body Dressing: with supervision;sitting;with caregiver independent in assisting Pt Will Transfer to Toilet: with modified independence;ambulating Pt/caregiver will Perform Home Exercise Program: Right Upper extremity;Increased ROM;With Supervision;With written HEP provided Additional ADL Goal #1: Pt will demonstrate  increase in shoulder flexion to 90 supine - AAROM; ER to 30 with  dowel/AAROM  Plan Discharge plan remains appropriate    Co-evaluation                 End of Session     Activity Tolerance Patient tolerated treatment well   Patient Left in bed;with call bell/phone within reach   Nurse Communication Mobility status        Time: 1610-9604 -   22 min  Charges: OT General Charges $OT Visit: 1 Procedure OT Treatments $Therapeutic Exercise: 8-22 mins  Athalie Newhard,HILLARY 02/10/2015, 5:22 PM   [facility], OTR/L  [phone] 02/10/2015

## 2015-02-11 ENCOUNTER — Inpatient Hospital Stay (HOSPITAL_COMMUNITY): Payer: Medicaid Other

## 2015-02-11 DIAGNOSIS — R609 Edema, unspecified: Secondary | ICD-10-CM

## 2015-02-11 DIAGNOSIS — M4620 Osteomyelitis of vertebra, site unspecified: Secondary | ICD-10-CM

## 2015-02-11 LAB — TYPE AND SCREEN
ABO/RH(D): B POS
ANTIBODY SCREEN: NEGATIVE
Unit division: 0

## 2015-02-11 LAB — BASIC METABOLIC PANEL
ANION GAP: 8 (ref 5–15)
BUN: 34 mg/dL — ABNORMAL HIGH (ref 6–23)
CALCIUM: 8.5 mg/dL (ref 8.4–10.5)
CO2: 29 mmol/L (ref 19–32)
Chloride: 99 mmol/L (ref 96–112)
Creatinine, Ser: 2.48 mg/dL — ABNORMAL HIGH (ref 0.50–1.35)
GFR, EST AFRICAN AMERICAN: 37 mL/min — AB (ref >90)
GFR, EST NON AFRICAN AMERICAN: 32 mL/min — AB (ref >90)
Glucose, Bld: 224 mg/dL — ABNORMAL HIGH (ref 70–99)
Potassium: 4 mmol/L (ref 3.5–5.1)
SODIUM: 136 mmol/L (ref 135–145)

## 2015-02-11 LAB — GLUCOSE, CAPILLARY
GLUCOSE-CAPILLARY: 181 mg/dL — AB (ref 70–99)
GLUCOSE-CAPILLARY: 206 mg/dL — AB (ref 70–99)
Glucose-Capillary: 240 mg/dL — ABNORMAL HIGH (ref 70–99)
Glucose-Capillary: 178 mg/dL — ABNORMAL HIGH (ref 70–99)
Glucose-Capillary: 237 mg/dL — ABNORMAL HIGH (ref 70–99)

## 2015-02-11 LAB — CBC
HCT: 26.1 % — ABNORMAL LOW (ref 39.0–52.0)
Hemoglobin: 8.6 g/dL — ABNORMAL LOW (ref 13.0–17.0)
MCH: 28.3 pg (ref 26.0–34.0)
MCHC: 33 g/dL (ref 30.0–36.0)
MCV: 85.9 fL (ref 78.0–100.0)
PLATELETS: 275 10*3/uL (ref 150–400)
RBC: 3.04 MIL/uL — ABNORMAL LOW (ref 4.22–5.81)
RDW: 13.9 % (ref 11.5–15.5)
WBC: 4.9 10*3/uL (ref 4.0–10.5)

## 2015-02-11 LAB — APTT: aPTT: 28 seconds (ref 24–37)

## 2015-02-11 LAB — PROTIME-INR
INR: 1.03 (ref 0.00–1.49)
Prothrombin Time: 13.6 seconds (ref 11.6–15.2)

## 2015-02-11 LAB — MAGNESIUM: MAGNESIUM: 2.4 mg/dL (ref 1.5–2.5)

## 2015-02-11 LAB — BRAIN NATRIURETIC PEPTIDE: B Natriuretic Peptide: 182.2 pg/mL — ABNORMAL HIGH (ref 0.0–100.0)

## 2015-02-11 LAB — PREALBUMIN: PREALBUMIN: 30.6 mg/dL (ref 17.0–34.0)

## 2015-02-11 MED ORDER — INSULIN ASPART 100 UNIT/ML ~~LOC~~ SOLN
4.0000 [IU] | Freq: Three times a day (TID) | SUBCUTANEOUS | Status: DC
  Administered 2015-02-12 – 2015-02-17 (×17): 4 [IU] via SUBCUTANEOUS

## 2015-02-11 NOTE — Progress Notes (Signed)
PROGRESS NOTE  Dwayne [name] WUJ:[id number] DOB: [birth year] DOA: 02/02/2015 PCP: No PCP Per Patient  HPI/Recap of past 24 hours: s/p right shoulder washout. Post op day 6. Denies CP, SOB and fever. Complaining of pain right shoulder with movement. BP still elevated but better. Reports having difficulty with diet texture and also complaining food amount is not enough  Assessment/Plan: Principal Problem:   Acute diastolic CHF (congestive heart failure), NYHA class 3 Active Problems:   Diabetes type 2, uncontrolled   Uncontrolled hypertension   Acute renal failure   Severe mitral regurgitation   Endocarditis   Anasarca   Arthritis, septic, shoulder   Chronic renal insufficiency, stage III (moderate)   Acute renal failure superimposed on stage 3 chronic kidney disease   Bacterial endocarditis   Edema   HTN (hypertension)   Acute on chronic diastolic heart failure   Facial numbness   Vertebral osteomyelitis   Septic arthritis of shoulder, right  1. Acute on chronic diastolic dysfunction EF 60%. 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 Also with diet and meds non-compliance. -Slight bump in patient's creatinine has led cardiology service to adjust lasix to even lower by mouth dose -Will continue coreg/imdur/hydralazine -Patient encouraged to follow low sodium diet -Will continue daily weights and strict I's and O's  2. Endocarditis: Continue IV ampicillin dosed by pharmacy for now. Infectious disease consulted; will follow rec's. Has left arm PICC line. Thoracic surgery consulted and tentatively plan is for surgery on 02/15/15. -Continue patient tune up for potential surgery next week  3. Right shoulder septic arthritis.  -Mri shoulder on 2/5 concerning for septic arthritis/osteomyelitis; Dr. Lajoyce Corners consulted. -2/6 patient was brought to the OR and had: Right shoulder ARTHROSCOPY SHOULDER Extensive debridement of the glenohumeral joint. Subacromial decompression.    -Orthopedic service to help with documentation of depth, type and instrumentation used.  4. Septic embolic infarcts: due to c/o facial numbness on 2/7, mri brain repeated, results showed: While several of these foci appear to be of slightly different ages, these are new relative to prior MRI from 01/14/2015, where the previously seen infarcts have resolved/normalized. No significant mass effect or parenchymal hemorrhage.  -continue IV antibiotics and hopefully surgery to fix his MV soon  5. Dental caries. Multiple teeth removed one week ago prior to this admission. -Still requiring prn pain meds and unable to chew -will continue dysphagia 3 diet  6. DM type II. Last A1c was above 8.  -will adjust as needed -continue SSI and Levemir -will add meal coverage; CBG's around 200 during meals  7. Essential hypertension.  -Remains Uncontrolled, but better -following cardiology rec's will start clonidine 0.2 mg twice a day -Will continue hydralazine, norvasc, coreg and Imdur  -low sodium diet encourage -Patient on Lasix 80 mg by mouth starting today to 11 as per cardiology recommendations  8. Chronic kidney disease stage IV. Baseline creatinine around 2.  -Currently at baseline. -per cardiology start PO lasix today (  2/11) -2.48 (2/12)  9. Anemia of chronic disease: Hemoglobin was slightly drifting down most likely due to multiple consecutive phlebotomies -In anticipation for surgery received 1 unit of PRBCs on 2/11 -Hgb 8.6 -will follow trend   Code Status: full  Family Communication: patient and wife at bedside  Disposition Plan: remain inpatient; anticipated surgery next week (per Dr. Cornelius Moras 2/16)   Consultants:  Cardiology/cardiothoracic surgery/infectious disease/ortho  Procedures: 2/6 Right shoulder joint ARTHROSCOPY SHOULDER  Extensive debridement of the glenohumeral joint. Subacromial decompression.  Antibiotics:  ampicillin  Objective: BP 145/65 mmHg   Pulse 71  Temp(Src) 97.4 F (36.3 C) (Oral)  Resp 18  Ht  (1.88 m)  Wt 116.075 kg (255 lb 14.4 oz)  BMI 32.84 kg/m2  SpO2 99%  Intake/Output Summary (Last 24 hours) at 02/11/15 1901 Last data filed at 02/11/15 1724  Gross per 24 hour  Intake   1500 ml  Output   1025 ml  Net    475 ml   Filed Weights   02/09/15 0400 02/10/15 0400 02/11/15 0445  Weight: 116 kg (255 lb 11.7 oz) 113.218 kg (249 lb 9.6 oz) 116.075 kg (255 lb 14.4 oz)    Exam:   General:  AAOx3, NAD; denies CP. BP still elevated but improved overall. Volume stable (255 pounds)  Cardiovascular: RRR, soft systolic murmur appreciated on exam, no gallops  Respiratory: decreased at bases, no wheezing and no rhonchi  Abdomen: soft/NT/ND, positive bowel sounds  Musculoskeletal: pitting edema bilateral (1-2++), right shoulder dressing intact/decrease range of motion and pain with movement.  Data Reviewed: Basic Metabolic Panel:  Recent Labs Lab 02/06/15 0500 02/07/15 0543 02/08/15 0500 02/09/15 1305 02/10/15 0520 02/11/15 0408  NA 137 138 138 137 138 136  K 3.7 3.7 3.3* 3.7 4.0 4.0  CL 104 105 102 99 101 99  CO2 32 29  GLUCOSE 159* 154* 182* 284* 183* 224*  BUN 28* 34*  CREATININE 1.67* 1.80* 1.67* 1.96* 2.14* 2.48*  CALCIUM 7.9* 8.3* 8.2* 8.2* 8.4 8.5  MG 1.8 1.9 1.9  --  2.1 2.4   CBC:  Recent Labs Lab 02/05/15 0500 02/06/15 0500 02/10/15 0520 02/10/15 1945 02/11/15 0408  WBC 4.7 6.3 5.0 5.4 4.9  NEUTROABS  --   --  3.3  --   --   HGB 8.1* 8.2* 7.8* 8.7* 8.6*  HCT 25.3* 25.3* 23.9* 26.7* 26.1*  MCV 85.8 86.1 87.5 88.1 85.9  PLT 318 330 287 294 275   BNP (last 3 results)  Recent Labs  01/22/15 0347 02/02/15 1035 02/11/15 0510  BNP 218.9* 629.0* 182.2*   CBG:  Recent Labs Lab 02/10/15 1624 02/10/15 2117 02/11/15 0724 02/11/15 1149 02/11/15 1630  GLUCAP 240* 281* 178* 181* 206*    Recent Results (from the past 240 hour(s))  Culture, blood  (routine x 2)     Status: None   Collection Time: 02/02/15  3:53 PM  Result Value Ref Range Status   Specimen Description BLOOD PICC LINE  Final   Special Requests BOTTLES DRAWN AEROBIC AND ANAEROBIC  Final   Culture   Final    NO GROWTH 5 DAYS Performed at [facility]    Report Status 02/08/2015 FINAL  Final  Culture, blood (routine x 2)     Status: None   Collection Time: 02/02/15  4:32 PM  Result Value Ref Range Status   Specimen Description BLOOD PICC LINE  Final   Special Requests BOTTLES DRAWN AEROBIC AND ANAEROBIC 10CC  Final   Culture   Final    NO GROWTH 5 DAYS Performed at [facility]    Report Status 02/08/2015 FINAL  Final  MRSA PCR Screening     Status: None   Collection Time: 02/02/15  5:14 PM  Result Value Ref Range Status   MRSA by PCR NEGATIVE NEGATIVE Final    Comment:        The GeneXpert MRSA Assay (FDA approved for NASAL specimens only), is one component of  a comprehensive MRSA colonization surveillance program. It is not intended to diagnose MRSA infection nor to guide or monitor treatment for MRSA infections.   Body fluid culture     Status: None   Collection Time: 02/04/15  5:45 PM  Result Value Ref Range Status   Specimen Description SYNOVIAL FLUID SHOULDER RIGHT  Final   Special Requests NONE  Final   Gram Stain   Final    FEW WBC PRESENT, PREDOMINANTLY PMN NO ORGANISMS SEEN [facility]formed [facility]    Culture   Final    NO GROWTH 3 DAYS Performed at [facility]    Report Status 02/08/2015 FINAL  Final  Surgical pcr screen     Status: None   Collection Time: 02/05/15  5:06 AM  Result Value Ref Range Status   MRSA, PCR NEGATIVE NEGATIVE Final   Staphylococcus aureus NEGATIVE NEGATIVE Final    Comment:        The Xpert SA Assay (FDA approved for NASAL specimens in patients over 621 years of age), is one component of a comprehensive surveillance program.  Test performance has been validated by  [facility] for patients greater than or equal to 35 year old. It is not intended to diagnose infection nor to guide or monitor treatment.      Studies: No results found.  Scheduled Meds: . amLODipine  10 mg Oral Daily  . ampicillin (OMNIPEN) IV  2 g Intravenous Q4H  . carvedilol  50 mg Oral BID WC  . cloNIDine  0.2 mg Oral BID  . docusate sodium  100 mg Oral BID  . feeding supplement (ENSURE COMPLETE)  237 mL Oral TID WC  . furosemide  80 mg Oral Daily  . hydrALAZINE  100 mg Oral 3 times per day  . Influenza vac split quadrivalent PF  0.5 mL Intramuscular Tomorrow-1000  . insulin aspart  0-9 Units Subcutaneous TID WC  . [START ON 02/12/2015] insulin aspart  4 Units Subcutaneous TID WC  . insulin detemir  10 Units Subcutaneous QHS  . isosorbide mononitrate  60 mg Oral BID  . polyethylene glycol  17 g Oral Daily  . sodium chloride  3 mL Intravenous Q12H    Continuous Infusions: . sodium chloride 10 mL/hr at 02/07/15 0132    Time: 30 minutes   Vassie LollMadera, Amrit Cress  [facility] Pager [phone]. If 7PM-7AM, please contact night-coverage at [email or web address], password [email or web address] 02/11/2015, 7:01 PM  LOS: 9 days

## 2015-02-11 NOTE — Progress Notes (Signed)
[facility]    Subjective:   No complaints today Antibiotics:  Anti-infectives    Start     Dose/Rate Route Frequency Ordered Stop   02/02/15 1700  ampicillin (OMNIPEN) 2 g in sodium chloride 0.9 % 50 mL IVPB     2 g 150 mL/hr over 20 Minutes Intravenous Every 4 hours 02/02/15 1638        Medications: Scheduled Meds: . amLODipine  10 mg Oral Daily  . ampicillin (OMNIPEN) IV  2 g Intravenous Q4H  . carvedilol  50 mg Oral BID WC  . cloNIDine  0.2 mg Oral BID  . docusate sodium  100 mg Oral BID  . feeding supplement (ENSURE COMPLETE)  237 mL Oral TID WC  . furosemide  80 mg Oral Daily  . hydrALAZINE  100 mg Oral 3 times per day  . Influenza vac split quadrivalent PF  0.5 mL Intramuscular Tomorrow-1000  . insulin aspart  0-9 Units Subcutaneous TID WC  . [START ON 02/12/2015] insulin aspart  4 Units Subcutaneous TID WC  . insulin detemir  10 Units Subcutaneous QHS  . isosorbide mononitrate  60 mg Oral BID  . polyethylene glycol  17 g Oral Daily  . sodium chloride  3 mL Intravenous Q12H   Continuous Infusions: . sodium chloride 10 mL/hr at 02/07/15 0132   PRN Meds:.acetaminophen, alum & mag hydroxide-simeth, bisacodyl, guaiFENesin-dextromethorphan, hydrALAZINE, magnesium hydroxide, methocarbamol **OR** methocarbamol (ROBAXIN)  IV, metoCLOPramide **OR** metoCLOPramide (REGLAN) injection, ondansetron **OR** ondansetron (ZOFRAN) IV, oxyCODONE-acetaminophen, senna-docusate    Objective: Weight change: 6 lb 4.8 oz (2.858 kg)  Intake/Output Summary (Last 24 hours) at 02/11/15 2010 Last data filed at 02/11/15 1724  Gross per 24 hour  Intake   1020 ml  Output    775 ml  Net    245 ml   Blood pressure 145/65, pulse 71, temperature 97.4 F (36.3 C), temperature source Oral, resp. rate 18, height 6\' 2"  (1.88 m), weight 255 lb 14.4 oz (116.075 kg), SpO2 99 %. Temp:  [97.3 F (36.3 C)-98.1 F (36.7 C)] 97.4 F (36.3 C) (02/12 1633) Pulse Rate:   [71-84] 71 (02/12 1633) Resp:  [18] 18 (02/12 0013) BP: (138-191)/(60-105) 145/65 mmHg (02/12 1633) SpO2:  [99 %-100 %] 99 % (02/12 1633) Weight:  [255 lb 14.4 oz (116.075 kg)] 255 lb 14.4 oz (116.075 kg) (02/12 0445)  Physical Exam: General: aox3  sleeping comfortably but awakens to voice CV: Systolic murmur loudest at PMI with radiation to axilla Pulm: clear, no wheezes or rhonchi  Neuro; non focal CBC:   CBC Latest Ref Rng 02/11/2015 02/10/2015 02/10/2015  WBC 4.0 - 10.5 K/uL 4.9 5.4 5.0  Hemoglobin 13.0 - 17.0 g/dL 1.6(X) 0.9(U) 7.8(L)  Hematocrit 39.0 - 52.0 % 26.1(L) 26.7(L) 23.9(L)  Platelets 150 - 400 K/uL 275 294 287     BMET  Recent Labs  02/10/15 0520 02/11/15 0408  NA 138 136  K 4.0 4.0  CL 101 99  CO2 32 29  GLUCOSE 183* 224*  BUN 28* 34*  CREATININE 2.14* 2.48*  CALCIUM 8.4 8.5     Liver Panel  No results for input(s): PROT, ALBUMIN, AST, ALT, ALKPHOS, BILITOT, BILIDIR, IBILI in the last 72 hours.     Sedimentation Rate No results for input(s): ESRSEDRATE in the last 72 hours. C-Reactive Protein No results for input(s): CRP in the last 72 hours.  Micro Results: Recent Results (from the past 720 hour(s))  Culture, blood (routine x 2)  Status: None   Collection Time: 01/13/15  7:20 PM  Result Value Ref Range Status   Specimen Description BLOOD RIGHT ARM  Final   Special Requests BOTTLES DRAWN AEROBIC AND ANAEROBIC 5CC  Final   Culture   Final    NO GROWTH 5 DAYS Performed at [facility]    Report Status 01/20/2015 FINAL  Final  Culture, blood (routine x 2)     Status: None   Collection Time: 01/13/15  7:20 PM  Result Value Ref Range Status   Specimen Description BLOOD RIGHT ARM  Final   Special Requests BOTTLES DRAWN AEROBIC AND ANAEROBIC 3CC  Final   Culture   Final    NO GROWTH 5 DAYS Performed at [facility]    Report Status 01/20/2015 FINAL  Final  Body fluid culture     Status: None   Collection Time:  01/14/15  9:02 AM  Result Value Ref Range Status   Specimen Description FLUID RIGHT SHOULDER SYNOVIAL  Final   Special Requests NONE  Final   Gram Stain   Final    FEW WBC PRESENT,BOTH PMN AND MONONUCLEAR NO ORGANISMS SEEN Performed at [facility]    Culture   Final    NO GROWTH 3 DAYS Performed at [facility]    Report Status 01/17/2015 FINAL  Final  Culture, blood (routine x 2)     Status: None   Collection Time: 01/18/15  6:10 PM  Result Value Ref Range Status   Specimen Description BLOOD LEFT ARM  Final   Special Requests BOTTLES DRAWN AEROBIC AND ANAEROBIC 10CC  Final   Culture   Final    NO GROWTH 5 DAYS Performed at [facility]    Report Status 01/25/2015 FINAL  Final  Culture, blood (routine x 2)     Status: None   Collection Time: 01/18/15  6:18 PM  Result Value Ref Range Status   Specimen Description BLOOD RIGHT ARM  Final   Special Requests BOTTLES DRAWN AEROBIC AND ANAEROBIC 10CC  Final   Culture   Final    NO GROWTH 5 DAYS Performed at [facility]    Report Status 01/25/2015 FINAL  Final  Surgical pcr screen     Status: None   Collection Time: 01/25/15  4:47 AM  Result Value Ref Range Status   MRSA, PCR NEGATIVE NEGATIVE Final   Staphylococcus aureus NEGATIVE NEGATIVE Final    Comment:        The Xpert SA Assay (FDA approved for NASAL specimens in patients over 75 years of age), is one component of a comprehensive surveillance program.  Test performance has been validated by [facility] for patients greater than or equal to 68 year old. It is not intended to diagnose infection nor to guide or monitor treatment.   Culture, blood (routine x 2)     Status: None   Collection Time: 02/02/15  3:53 PM  Result Value Ref Range Status   Specimen Description BLOOD PICC LINE  Final   Special Requests BOTTLES DRAWN AEROBIC AND ANAEROBIC  Final   Culture   Final    NO GROWTH 5 DAYS Performed at [facility]     Report Status 02/08/2015 FINAL  Final  Culture, blood (routine x 2)     Status: None   Collection Time: 02/02/15  4:32 PM  Result Value Ref Range Status   Specimen Description BLOOD PICC LINE  Final   Special Requests  BOTTLES DRAWN AEROBIC AND ANAEROBIC 10CC  Final   Culture   Final    NO GROWTH 5 DAYS Performed at [facility]    Report Status 02/08/2015 FINAL  Final  MRSA PCR Screening     Status: None   Collection Time: 02/02/15  5:14 PM  Result Value Ref Range Status   MRSA by PCR NEGATIVE NEGATIVE Final    Comment:        The GeneXpert MRSA Assay (FDA approved for NASAL specimens only), is one component of a comprehensive MRSA colonization surveillance program. It is not intended to diagnose MRSA infection nor to guide or monitor treatment for MRSA infections.   Body fluid culture     Status: None   Collection Time: 02/04/15  5:45 PM  Result Value Ref Range Status   Specimen Description SYNOVIAL FLUID SHOULDER RIGHT  Final   Special Requests NONE  Final   Gram Stain   Final    FEW WBC PRESENT, PREDOMINANTLY PMN NO ORGANISMS SEEN Performed at [facility]    Culture   Final    NO GROWTH 3 DAYS Performed at [facility]    Report Status 02/08/2015 FINAL  Final  Surgical pcr screen     Status: None   Collection Time: 02/05/15  5:06 AM  Result Value Ref Range Status   MRSA, PCR NEGATIVE NEGATIVE Final   Staphylococcus aureus NEGATIVE NEGATIVE Final    Comment:        The Xpert SA Assay (FDA approved for NASAL specimens in patients over 55 years of age), is one component of a comprehensive surveillance program.  Test performance has been validated by [facility] for patients greater than or equal to 49 year old. It is not intended to diagnose infection nor to guide or monitor treatment.     Studies/Results: Dg Chest 2 View  02/11/2015   CLINICAL DATA:  Congestive heart failure.  Shortness of breath.  EXAM: CHEST  2 VIEW   COMPARISON:  Single transit PA and lateral chest 02/02/2015 in 01/14/2015.  FINDINGS: There small bilateral pleural effusions with mild basilar atelectasis. Heart size is enlarged but no evidence of pulmonary edema is identified. Patchy airspace disease in the upper lung zones seen on the most recent examination are no longer identified. Small pleural effusions are noted.  IMPRESSION: Patchy bilateral upper lobe airspace disease most examination has resolved.  Cardiomegaly and small effusions without evidence of pulmonary edema.   Electronically Signed   By: Drusilla Kanner M.D.   On: 02/11/2015 09:51      Assessment/Plan:  Principal Problem:   Acute diastolic CHF (congestive heart failure), NYHA class 3 Active Problems:   Diabetes type 2, uncontrolled   Uncontrolled hypertension   Acute renal failure   Severe mitral regurgitation   Endocarditis   Anasarca   Arthritis, septic, shoulder   Chronic renal insufficiency, stage III (moderate)   Acute renal failure superimposed on stage 3 chronic kidney disease   Bacterial endocarditis   Edema   HTN (hypertension)   Acute on chronic diastolic heart failure   Facial numbness   Vertebral osteomyelitis   Septic arthritis of shoulder, right    Dwayne [name] is a 35 y.o. 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, PERFORATION, SEVERE MR,  PERICARDITIS with SEPTIC EMBOLI TO CNS,  RIGHT SEPTIC SHOULDER,, THORACIC SPINE DISCITIS, VERTEBRAL OSTEOMYELITIS currently on high-dose ampicillin and readmitted for apparent heart failure now sp I and D  RIGHT SHOULDER UNFORTUNATELY FOUND TO HAVE NEW SEPTIC EMBOLI IN THE BRAIN  #1 Penicillin Sensitive Staphylococcus aureus bacteremia mitral valve endocarditis with perforation severe mitral regurgitation, septic emboli to the brain right septic shoulder and thoracic spine discitis sp I and D shoulder, now with MRI showing new septic emboli to brain on effective  antibiotics therapy  --PRESENCE OF NEW EMBOLI IN THE BRAIN WHILE ON PROPER ANTIBIOTICS IS AN INDICATION FOR MORE URGENT VALVE REPLACEMENT  --I WOULD FAVOR VALVE REPLACEMENT AS SOON AS POSSIBLE AS I HAVE ANXIETY ABOUT PT HAVING A POTENTIALLY DISABLING EMBOLISM TO HIS CNS   --CONTINUE  high dose AMPICILLIN  IF HE HAS FEVERS, NEED REPEAT BLOOD CULTURES, BOTHERED BY HIM HAVING RIGORS TODAY  Greatly appreciate Dr. Lajoyce Cornersuda performing I and D of shoulder which should help us control this infection. Again I worry that he needs valve surgery more urgently now, I would consider asking Dr. Lajoyce Cornersuda to come and reexamine patient versus repeat imaging the shoulder again if pain persists over the next week.    #2 Septic arthritis with osteomyelitis: sp I and D by Dr. Lajoyce Cornersuda Will consider re-imaging shoulder down the road to reassess bone, and perhaps do this sooner if pain still not improving much  #3 Vertebral osteomyelitis, diskitis: he has NO new pain in the back, in fact no pain at all  I would consider repeat MRI 2 months after the last one to ensure this is progressing at appropriate pace (keeping in mind that imaging can lag behind clinical progress here)  I do not see a clinical reason to get an MRI of spine at this point, in fact bone and disc can look worse a month into therapy. He never had epidural abscess when originally imaged     LOS: 9 days   Acey LavCornelius Van [name] 02/11/2015, 8:10 PM

## 2015-02-11 NOTE — Progress Notes (Signed)
Patient Name: Dwayne [name] Date of Encounter: 02/11/2015  Principal Problem:   Acute diastolic CHF (congestive heart failure), NYHA class 3 Active Problems:   Diabetes type 2, uncontrolled   Uncontrolled hypertension   Acute renal failure   Severe mitral regurgitation   Endocarditis   Anasarca   Arthritis, septic, shoulder   Chronic renal insufficiency, stage III (moderate)   Acute renal failure superimposed on stage 3 chronic kidney disease   Bacterial endocarditis   Edema   HTN (hypertension)   Acute on chronic diastolic heart failure   Facial numbness   Vertebral osteomyelitis   Septic arthritis of shoulder, right   Length of Stay: 9  SUBJECTIVE  Fairly balanced in/out. Still eating outside food. BP high, but better. Started on clonidine.  CURRENT MEDS . amLODipine  10 mg Oral Daily  . ampicillin (OMNIPEN) IV  2 g Intravenous Q4H  . carvedilol  50 mg Oral BID WC  . cloNIDine  0.2 mg Oral BID  . docusate sodium  100 mg Oral BID  . feeding supplement (ENSURE COMPLETE)  237 mL Oral TID WC  . furosemide  80 mg Oral Daily  . hydrALAZINE  100 mg Oral 3 times per day  . Influenza vac split quadrivalent PF  0.5 mL Intramuscular Tomorrow-1000  . insulin aspart  0-9 Units Subcutaneous TID WC  . insulin detemir  10 Units Subcutaneous QHS  . isosorbide mononitrate  60 mg Oral BID  . polyethylene glycol  17 g Oral Daily  . sodium chloride  3 mL Intravenous Q12H    OBJECTIVE   Intake/Output Summary (Last 24 hours) at 02/11/15 0800 Last data filed at 02/11/15 0413  Gross per 24 hour  Intake   1710 ml  Output   1175 ml  Net    535 ml   Filed Weights   02/09/15 0400 02/10/15 0400 02/11/15 0445  Weight: 116 kg (255 lb 11.7 oz) 113.218 kg (249 lb 9.6 oz) 116.075 kg (255 lb 14.4 oz)    PHYSICAL EXAM Filed Vitals:   02/11/15 0410 02/11/15 0430 02/11/15 0445 02/11/15 0510  BP: 174/103 165/92  138/60  Pulse:      Temp: 98.1 F (36.7 C)     TempSrc: Oral      Resp:      Height:      Weight:   116.075 kg (255 lb 14.4 oz)   SpO2: 100%      General: Alert, oriented x3, no distress Head: no evidence of trauma, PERRL, EOMI, no exophtalmos or lid lag, no myxedema, no xanthelasma; normal ears, nose and oropharynx Neck: normal jugular venous pulsations and no hepatojugular reflux; brisk carotid pulses without delay and no carotid bruits Chest: clear to auscultation, no signs of consolidation by percussion or palpation, normal fremitus, symmetrical and full respiratory excursions Cardiovascular: normal position and quality of the apical impulse, regular rhythm, normal first and second heart sounds, no rubs or gallops, 2/6 holosystolic apical murmur Abdomen: no tenderness or distention, no masses by palpation, no abnormal pulsatility or arterial bruits, normal bowel sounds, no hepatosplenomegaly Extremities: no clubbing, cyanosis ; 1+ pedal edema; 2+ radial, ulnar and brachial pulses bilaterally; 2+ right femoral, posterior tibial and dorsalis pedis pulses; 2+ left femoral, posterior tibial and dorsalis pedis pulses; no subclavian or femoral bruits Neurological: grossly nonfocal  LABS  CBC  Recent Labs  02/10/15 0520 02/10/15 1945 02/11/15 0408  WBC 5.0 5.4 4.9  NEUTROABS 3.3  --   --   HGB 7.8*  8.7* 8.6*  HCT 23.9* 26.7* 26.1*  MCV 87.5 88.1 85.9  PLT 287 294 275   Basic Metabolic Panel  Recent Labs  02/10/15 0520 02/11/15 0408  NA 138 136  K 4.0 4.0  CL 101 99  CO2 32 29  GLUCOSE 183* 224*  BUN 28* 34*  CREATININE 2.14* 2.48*  CALCIUM 8.4 8.5  MG 2.1 2.4    Radiology Studies Imaging results have been reviewed and No results found.  TELE NSR   ASSESSMENT AND PLAN 1. acute on chronic systolic heart failure due to severe mitral insufficiency- now probably at target weight. Switched to lower dose PO diuretics. Continues to eat canned foods from home, some with very high sodium content 2. 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 Possible surgery early next week. 3. staphylococcal endocarditis, also complicated by CNS embolism and secondary foci of infection in his shoulder and spine. Secondary foci of infection have been addressed. Has had 32 days of intravenous antibiotics. Last blood cultures were sterile. Due to his young age he will need a mechanical mitral valve if the valve cannot be repaired surgically. Surgery next Tuesday. 4. Severe hypertension - avoiding RAAS inhibitors due to volatile renal function. Consider amlodipine 5. Acute on chronic renal insufficiency - monitor closely, reduced diuretics 6. Anemia - multifactorial, renal insufficiency, endocarditis, etc. Stable, but may need some blood before surgery 7. HTN - severe/malignant; his high sodium intake is not helping. Not on RAAS inhibitors due to renal function. Wait 24 h to see effect of clonidine. Consider minoxidil   Dwayne FairMihai Clements Toro, MD, [facility] HospitalFACC [facility] [phone] office [phone] pager 02/11/2015 8:00 AM

## 2015-02-11 NOTE — Progress Notes (Signed)
Inpatient Diabetes Program Recommendations  AACE/ADA: New Consensus Statement on Inpatient Glycemic Control (2013)  Target Ranges:  Prepandial:   less than 140 mg/dL      Peak postprandial:   less than 180 mg/dL (1-2 hours)      Critically ill patients:  140 - 180 mg/dL   Fasting glucose is less than 200 mg/dL, however the cbg's before meals (lunch and supper) are elevated greater than 200 mg/dL Home insulin is 40/9870/30 -14 units bid which calculates to 18 units basal and total of 10 units meal coverage per day. Please consider addition of 4 units tidwc as recommended in prior note in addition to the sensitive correction scale tidwc.  Thank you, Lenor CoffinAnn Savvas Roper, RN, CNS, Diabetes Coordinator  Pager [phone]) 8:00 am to 5:00pm Office [phone])  8:5614m - 5:00 pm

## 2015-02-12 LAB — GLUCOSE, CAPILLARY
Glucose-Capillary: 150 mg/dL — ABNORMAL HIGH (ref 70–99)
Glucose-Capillary: 166 mg/dL — ABNORMAL HIGH (ref 70–99)
Glucose-Capillary: 153 mg/dL — ABNORMAL HIGH (ref 70–99)
Glucose-Capillary: 97 mg/dL (ref 70–99)

## 2015-02-12 LAB — BASIC METABOLIC PANEL
Anion gap: 7 (ref 5–15)
BUN: 43 mg/dL — ABNORMAL HIGH (ref 6–23)
CALCIUM: 8.4 mg/dL (ref 8.4–10.5)
CO2: 33 mmol/L — AB (ref 19–32)
CREATININE: 3.26 mg/dL — AB (ref 0.50–1.35)
Chloride: 96 mmol/L (ref 96–112)
GFR calc Af Amer: 27 mL/min — ABNORMAL LOW (ref >90)
GFR calc non Af Amer: 23 mL/min — ABNORMAL LOW (ref >90)
Glucose, Bld: 187 mg/dL — ABNORMAL HIGH (ref 70–99)
Potassium: 4.6 mmol/L (ref 3.5–5.1)
SODIUM: 136 mmol/L (ref 135–145)

## 2015-02-12 LAB — MAGNESIUM: Magnesium: 2.4 mg/dL (ref 1.5–2.5)

## 2015-02-12 MED ORDER — SIMETHICONE 40 MG/0.6ML PO SUSP: 40.0000 mg | Freq: Four times a day (QID) | ORAL | Status: DC | PRN

## 2015-02-12 MED ORDER — POLYETHYLENE GLYCOL 3350 17 G PO PACK
17.0000 g | PACK | Freq: Two times a day (BID) | ORAL | Status: DC
  Administered 2015-02-12 – 2015-02-17 (×9): 17 g via ORAL
  Filled 2015-02-12 (×11): qty 1

## 2015-02-12 MED ORDER — SIMETHICONE 80 MG PO CHEW
80.0000 mg | CHEWABLE_TABLET | Freq: Four times a day (QID) | ORAL | Status: DC | PRN
  Administered 2015-02-12: 80 mg via ORAL
  Filled 2015-02-12 (×2): qty 1

## 2015-02-12 NOTE — Progress Notes (Signed)
PROGRESS NOTE  Dwayne [name] UJW:[id number] DOB: Nov 12, 1980 DOA: 02/02/2015 PCP: No PCP Per Patient  HPI/Recap of past 24 hours: s/p right shoulder washout. Post op day 7. Denies CP, SOB and fever. Still Complaining of pain right shoulder with movement. BP still elevated but better. Complaining of constipation  Assessment/Plan: Principal Problem:   Acute diastolic CHF (congestive heart failure), NYHA class 3 Active Problems:   Diabetes type 2, uncontrolled   Uncontrolled hypertension   Acute renal failure   Severe mitral regurgitation   Endocarditis   Anasarca   Arthritis, septic, shoulder   Chronic renal insufficiency, stage III (moderate)   Acute renal failure superimposed on stage 3 chronic kidney disease   Bacterial endocarditis   Edema   HTN (hypertension)   Acute on chronic diastolic heart failure   Facial numbness   Vertebral osteomyelitis   Septic arthritis of shoulder, right  1. Acute on chronic diastolic dysfunction EF 60%. 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 Also with diet and meds non-compliance. -Cr jump to 3.2; will hold lasix today -Will continue coreg/imdur/hydralazine -Patient encouraged to follow low sodium diet -Will continue daily weights and strict I's and O's -weight is stable  2. Endocarditis: Continue IV ampicillin dosed by pharmacy for now. Infectious disease consulted; will follow rec's. Has left arm PICC line. Thoracic surgery consulted and tentatively plan is for surgery on 02/15/15. -Continue patient tune up for potential surgery next week  3. Right shoulder septic arthritis.  -Mri shoulder on 2/5 concerning for septic arthritis/osteomyelitis; Dr. Lajoyce Corners consulted. -2/6 patient was brought to the OR and had: Right shoulder ARTHROSCOPY SHOULDER Extensive debridement of the glenohumeral joint. Subacromial decompression.  -Orthopedic service to help with documentation of depth, type and instrumentation used.  4. Septic embolic  infarcts: due to c/o facial numbness on 2/7, mri brain repeated, results showed: While several of these foci appear to be of slightly different ages, these are new relative to prior MRI from 01/14/2015, where the previously seen infarcts have resolved/normalized. No significant mass effect or parenchymal hemorrhage.  -continue IV antibiotics and hopefully surgery to fix his MV soon  5. Dental caries. Multiple teeth removed one week ago prior to this admission. -Still requiring prn pain meds and unable to chew -will continue dysphagia 3 diet  6. DM type II. Last A1c was above 8.  -will adjust as needed -continue SSI, novolog meal coverage and Levemir  7. Essential hypertension.  -Remains Uncontrolled, but better -following cardiology rec's will start clonidine 0.2 mg twice a day -Will continue hydralazine, norvasc, coreg and Imdur  -low sodium diet encourage -Patient on Lasix 80 mg by mouth starting today to 11 as per cardiology recommendations  8.Acute on Chronic kidney disease stage IV. Baseline creatinine around 2-2.6.  -Cr at 3.26 today (2/13); due to diuresis -will hold lasix today  9. Anemia of chronic disease: Hemoglobin was slightly drifting down most likely due to multiple consecutive phlebotomies -In anticipation for surgery received 1 unit of PRBCs on 2/11 -Hgb 8.6 -will follow trend and if needed transfuse another unit of PRBC; plan is to keep Hgb as close to 9 as possible.   Code Status: full  Family Communication: patient and wife at bedside  Disposition Plan: remain inpatient; anticipated surgery next week (per Dr. Cornelius Moras 2/16)   Consultants:  Cardiology/cardiothoracic surgery/infectious disease/ortho  Procedures: 2/6 Right shoulder joint ARTHROSCOPY SHOULDER  Extensive debridement of the glenohumeral joint. Subacromial decompression.  Antibiotics:  ampicillin   Objective: BP 117/74 mmHg  Pulse 71  Temp(Src) 97.8 F (36.6 C) (Oral)  Resp 18  Ht  6\' 2"  (1.88 m)  Wt 118.207 kg (260 lb 9.6 oz)  BMI 33.44 kg/m2  SpO2 98%  Intake/Output Summary (Last 24 hours) at 02/12/15 1515 Last data filed at 02/12/15 1400  Gross per 24 hour  Intake    150 ml  Output   1250 ml  Net  -1100 ml   Filed Weights   02/10/15 0400 02/11/15 0445 02/12/15 0400  Weight: 113.218 kg (249 lb 9.6 oz) 116.075 kg (255 lb 14.4 oz) 118.207 kg (260 lb 9.6 oz)    Exam:   General:  AAOx3, NAD; denies CP. BP still elevated but improved overall. Volume stable (255 pounds)  Cardiovascular: RRR, soft systolic murmur appreciated on exam, no gallops  Respiratory: decreased at bases, no wheezing and no rhonchi  Abdomen: soft/NT/ND, positive bowel sounds  Musculoskeletal: pitting edema bilateral (1-2++), right shoulder dressing intact/decrease range of motion and pain with movement.  Data Reviewed: Basic Metabolic Panel:  Recent Labs Lab 02/07/15 0543 02/08/15 0500 02/09/15 1305 02/10/15 0520 02/11/15 0408 02/12/15 0500  NA 138 138 137 138 136 136  K 3.7 3.3* 3.7 4.0 4.0 4.6  CL 105 102 99 101 99 96  CO2 29 27 27  32 29 33*  GLUCOSE 154* 182* 284* 183* 224* 187*  BUN 18 18 21  28* 34* 43*  CREATININE 1.80* 1.67* 1.96* 2.14* 2.48* 3.26*  CALCIUM 8.3* 8.2* 8.2* 8.4 8.5 8.4  MG 1.9 1.9  --  2.1 2.4 2.4   CBC:  Recent Labs Lab 02/06/15 0500 02/10/15 0520 02/10/15 1945 02/11/15 0408  WBC 6.3 5.0 5.4 4.9  NEUTROABS  --  3.3  --   --   HGB 8.2* 7.8* 8.7* 8.6*  HCT 25.3* 23.9* 26.7* 26.1*  MCV 86.1 87.5 88.1 85.9  PLT 330 287 294 275   BNP (last 3 results)  Recent Labs  01/22/15 0347 02/02/15 1035 02/11/15 0510  BNP 218.9* 629.0* 182.2*   CBG:  Recent Labs Lab 02/11/15 1149 02/11/15 1630 02/11/15 2218 02/12/15 0757 02/12/15 1148  GLUCAP 181* 206* 237* 150* 166*    Recent Results (from the past 240 hour(s))  Culture, blood (routine x 2)     Status: None   Collection Time: 02/02/15  3:53 PM  Result Value Ref Range Status    Specimen Description BLOOD PICC LINE  Final   Special Requests BOTTLES DRAWN AEROBIC AND ANAEROBIC 5ML  Final   Culture   Final    NO GROWTH 5 DAYS Performed at [facility]    Report Status 02/08/2015 FINAL  Final  Culture, blood (routine x 2)     Status: None   Collection Time: 02/02/15  4:32 PM  Result Value Ref Range Status   Specimen Description BLOOD PICC LINE  Final   Special Requests BOTTLES DRAWN AEROBIC AND ANAEROBIC 10CC  Final   Culture   Final    NO GROWTH 5 DAYS Performed at [facility]    Report Status 02/08/2015 FINAL  Final  MRSA PCR Screening     Status: None   Collection Time: 02/02/15  5:14 PM  Result Value Ref Range Status   MRSA by PCR NEGATIVE NEGATIVE Final    Comment:        The GeneXpert MRSA Assay (FDA approved for NASAL specimens only), is one component of a comprehensive MRSA colonization surveillance program. It is not intended to diagnose MRSA infection nor to  guide or monitor treatment for MRSA infections.   Body fluid culture     Status: None   Collection Time: 02/04/15  5:45 PM  Result Value Ref Range Status   Specimen Description SYNOVIAL FLUID SHOULDER RIGHT  Final   Special Requests NONE  Final   Gram Stain   Final    FEW WBC PRESENT, PREDOMINANTLY PMN NO ORGANISMS SEEN Performed at [facility]    Culture   Final    NO GROWTH 3 DAYS Performed at [facility]    Report Status 02/08/2015 FINAL  Final  Surgical pcr screen     Status: None   Collection Time: 02/05/15  5:06 AM  Result Value Ref Range Status   MRSA, PCR NEGATIVE NEGATIVE Final   Staphylococcus aureus NEGATIVE NEGATIVE Final    Comment:        The Xpert SA Assay (FDA approved for NASAL specimens in patients over 71 years of age), is one component of a comprehensive surveillance program.  Test performance has been validated by [facility] for patients greater than or equal to 50 year old. It is not intended to diagnose  infection nor to guide or monitor treatment.      Studies: Dg Chest 2 View  02/11/2015   CLINICAL DATA:  Congestive heart failure.  Shortness of breath.  EXAM: CHEST  2 VIEW  COMPARISON:  Single transit PA and lateral chest 02/02/2015 in 01/14/2015.  FINDINGS: There small bilateral pleural effusions with mild basilar atelectasis. Heart size is enlarged but no evidence of pulmonary edema is identified. Patchy airspace disease in the upper lung zones seen on the most recent examination are no longer identified. Small pleural effusions are noted.  IMPRESSION: Patchy bilateral upper lobe airspace disease most examination has resolved.  Cardiomegaly and small effusions without evidence of pulmonary edema.   Electronically Signed   By: Drusilla Kanner M.D.   On: 02/11/2015 09:51    Scheduled Meds: . amLODipine  10 mg Oral Daily  . ampicillin (OMNIPEN) IV  2 g Intravenous Q4H  . carvedilol  50 mg Oral BID WC  . cloNIDine  0.2 mg Oral BID  . docusate sodium  100 mg Oral BID  . feeding supplement (ENSURE COMPLETE)  237 mL Oral TID WC  . hydrALAZINE  100 mg Oral 3 times per day  . Influenza vac split quadrivalent PF  0.5 mL Intramuscular Tomorrow-1000  . insulin aspart  0-9 Units Subcutaneous TID WC  . insulin aspart  4 Units Subcutaneous TID WC  . insulin detemir  10 Units Subcutaneous QHS  . isosorbide mononitrate  60 mg Oral BID  . polyethylene glycol  17 g Oral BID  . sodium chloride  3 mL Intravenous Q12H    Continuous Infusions: . sodium chloride 10 mL/hr at 02/07/15 0132    Time: 30 minutes   Vassie Loll  [facility] Pager [phone]. If 7PM-7AM, please contact night-coverage at [email or web address], password [facility] 02/12/2015, 3:15 PM  LOS: 10 days

## 2015-02-12 NOTE — Progress Notes (Addendum)
Patient ID: Dwayne [name], male   DOB: [birth year], 35 y.o.   MRN: [id number]  Patient Name: Jerri Hargadon Date of Encounter: 02/12/2015  Principal Problem:   Acute diastolic CHF (congestive heart failure), NYHA class 3 Active Problems:   Diabetes type 2, uncontrolled   Uncontrolled hypertension   Acute renal failure   Severe mitral regurgitation   Endocarditis   Anasarca   Arthritis, septic, shoulder   Chronic renal insufficiency, stage III (moderate)   Acute renal failure superimposed on stage 3 chronic kidney disease   Bacterial endocarditis   Edema   HTN (hypertension)   Acute on chronic diastolic heart failure   Facial numbness   Vertebral osteomyelitis   Septic arthritis of shoulder, right   Length of Stay: 10  SUBJECTIVE  Right sholder pain   CURRENT MEDS . amLODipine  10 mg Oral Daily  . ampicillin (OMNIPEN) IV  2 g Intravenous Q4H  . carvedilol  50 mg Oral BID WC  . cloNIDine  0.2 mg Oral BID  . docusate sodium  100 mg Oral BID  . feeding supplement (ENSURE COMPLETE)  237 mL Oral TID WC  . furosemide  80 mg Oral Daily  . hydrALAZINE  100 mg Oral 3 times per day  . Influenza vac split quadrivalent PF  0.5 mL Intramuscular Tomorrow-1000  . insulin aspart  0-9 Units Subcutaneous TID WC  . insulin aspart  4 Units Subcutaneous TID WC  . insulin detemir  10 Units Subcutaneous QHS  . isosorbide mononitrate  60 mg Oral BID  . polyethylene glycol  17 g Oral Daily  . sodium chloride  3 mL Intravenous Q12H    OBJECTIVE   Intake/Output Summary (Last 24 hours) at 02/12/15 0841 Last data filed at 02/12/15 0553  Gross per 24 hour  Intake    510 ml  Output   1000 ml  Net   -490 ml   Filed Weights   02/10/15 0400 02/11/15 0445 02/12/15 0400  Weight: 113.218 kg (249 lb 9.6 oz) 116.075 kg (255 lb 14.4 oz) 118.207 kg (260 lb 9.6 oz)    PHYSICAL EXAM Filed Vitals:   02/11/15 1152 02/11/15 1633 02/12/15 0000 02/12/15 0400  BP: 170/105 145/65 138/69 109/74   Pulse: 71 71 77 87  Temp: 97.5 F (36.4 C) 97.4 F (36.3 C) 98.3 F (36.8 C) 97.7 F (36.5 C)  TempSrc: Oral Oral Oral Oral  Resp:   18 18  Height:      Weight:    118.207 kg (260 lb 9.6 oz)  SpO2: 100% 99% 100% 98%   General: Alert, oriented x3, no distress Head: no evidence of trauma, PERRL, EOMI, no exophtalmos or lid lag, no myxedema, no xanthelasma; normal ears, nose and oropharynx Neck: normal jugular venous pulsations and no hepatojugular reflux; brisk carotid pulses without delay and no carotid bruits Chest: clear to auscultation, no signs of consolidation by percussion or palpation, normal fremitus, symmetrical and full respiratory excursions Cardiovascular: normal position and quality of the apical impulse, regular rhythm, normal first and second heart sounds, no rubs or gallops, 2/6 holosystolic apical murmur Abdomen: no tenderness or distention, no masses by palpation, no abnormal pulsatility or arterial bruits, normal bowel sounds, no hepatosplenomegaly Extremities: no clubbing, cyanosis ; 1+ pedal edema; 2+ radial, ulnar and brachial pulses bilaterally; 2+ right femoral, posterior tibial and dorsalis pedis pulses; 2+ left femoral, posterior tibial and dorsalis pedis pulses; no subclavian or femoral bruits Neurological: grossly nonfocal  LABS  CBC  Recent  Labs  02/10/15 0520 02/10/15 1945 02/11/15 0408  WBC 5.0 5.4 4.9  NEUTROABS 3.3  --   --   HGB 7.8* 8.7* 8.6*  HCT 23.9* 26.7* 26.1*  MCV 87.5 88.1 85.9  PLT 287 294 275   Basic Metabolic Panel  Recent Labs  02/11/15 0408 02/12/15 0500  NA 136 136  K 4.0 4.6  CL 99 96  CO2 29 33*  GLUCOSE 224* 187*  BUN 34* 43*  CREATININE 2.48* 3.26*  CALCIUM 8.5 8.4  MG 2.4 2.4    Radiology Studies Imaging results have been reviewed and Dg Chest 2 View  02/11/2015   CLINICAL DATA:  Congestive heart failure.  Shortness of breath.  EXAM: CHEST  2 VIEW  COMPARISON:  Single transit PA and lateral chest 02/02/2015  in 01/14/2015.  FINDINGS: There small bilateral pleural effusions with mild basilar atelectasis. Heart size is enlarged but no evidence of pulmonary edema is identified. Patchy airspace disease in the upper lung zones seen on the most recent examination are no longer identified. Small pleural effusions are noted.  IMPRESSION: Patchy bilateral upper lobe airspace disease most examination has resolved.  Cardiomegaly and small effusions without evidence of pulmonary edema.   Electronically Signed   By: Drusilla Kannerhomas  Dalessio M.D.   On: 02/11/2015 09:51    TELE NSR   ASSESSMENT AND PLAN 1. acute on chronic systolic heart failure due to severe mitral insufficiency- now probably at target weight. Hold lasix today with jump in Cr 2. Severe mitral insufficiency due to endocarditis related valve perforation - the plan is to perform surgery on this admission, but he needs optimization of his hemodynamic status first. Possible surgery early next week. 3. staphylococcal endocarditis, also complicated by CNS embolism and secondary foci of infection in his shoulder and spine. Secondary foci of infection have been addressed. Has had 32 days of intravenous antibiotics. Last blood cultures were sterile. Due to his young age he will need a mechanical mitral valve if the valve cannot be repaired surgically. Surgery next Tuesday. 4. Severe hypertension - avoiding RAAS inhibitors due to volatile renal function. Amlodipine started  On clonidine  5. Acute on chronic renal insufficiency - jump in Cr today lasix held plan per primary service  6. Anemia - multifactorial, renal insufficiency, endocarditis, etc. Stable, but may need some blood before surgery    Thurmon FairMihai Croitoru, MD, [facility] [phone] office [phone] pager 02/12/2015 8:41 AM

## 2015-02-13 LAB — MAGNESIUM: Magnesium: 2.5 mg/dL (ref 1.5–2.5)

## 2015-02-13 LAB — CBC
HCT: 25.8 % — ABNORMAL LOW (ref 39.0–52.0)
Hemoglobin: 8.5 g/dL — ABNORMAL LOW (ref 13.0–17.0)
MCH: 28.6 pg (ref 26.0–34.0)
MCHC: 32.9 g/dL (ref 30.0–36.0)
MCV: 86.9 fL (ref 78.0–100.0)
Platelets: 288 10*3/uL (ref 150–400)
RBC: 2.97 MIL/uL — ABNORMAL LOW (ref 4.22–5.81)
RDW: 13.8 % (ref 11.5–15.5)
WBC: 4.5 10*3/uL (ref 4.0–10.5)

## 2015-02-13 LAB — BASIC METABOLIC PANEL
ANION GAP: 5 (ref 5–15)
BUN: 48 mg/dL — ABNORMAL HIGH (ref 6–23)
CO2: 33 mmol/L — ABNORMAL HIGH (ref 19–32)
Calcium: 8.3 mg/dL — ABNORMAL LOW (ref 8.4–10.5)
Chloride: 98 mmol/L (ref 96–112)
Creatinine, Ser: 3.93 mg/dL — ABNORMAL HIGH (ref 0.50–1.35)
GFR calc Af Amer: 21 mL/min — ABNORMAL LOW (ref >90)
GFR calc non Af Amer: 18 mL/min — ABNORMAL LOW (ref >90)
Glucose, Bld: 120 mg/dL — ABNORMAL HIGH (ref 70–99)
Potassium: 4.4 mmol/L (ref 3.5–5.1)
SODIUM: 136 mmol/L (ref 135–145)

## 2015-02-13 LAB — GLUCOSE, CAPILLARY
GLUCOSE-CAPILLARY: 150 mg/dL — AB (ref 70–99)
Glucose-Capillary: 109 mg/dL — ABNORMAL HIGH (ref 70–99)
Glucose-Capillary: 115 mg/dL — ABNORMAL HIGH (ref 70–99)
Glucose-Capillary: 195 mg/dL — ABNORMAL HIGH (ref 70–99)

## 2015-02-13 MED ORDER — AMPICILLIN SODIUM 2 G IJ SOLR
2.0000 g | Freq: Four times a day (QID) | INTRAMUSCULAR | Status: DC
  Administered 2015-02-13 – 2015-02-16 (×11): 2 g via INTRAVENOUS
  Filled 2015-02-13 (×13): qty 2000

## 2015-02-13 MED ORDER — SODIUM CHLORIDE 0.9 % IJ SOLN
10.0000 mL | INTRAMUSCULAR | Status: DC | PRN
  Administered 2015-02-13 – 2015-02-15 (×2): 10 mL
  Administered 2015-02-17: 3 mL
  Filled 2015-02-13 (×2): qty 40

## 2015-02-13 NOTE — Progress Notes (Signed)
ANTIBIOTIC CONSULT NOTE - FOLLOW UP  Pharmacy Consult for Ampicillin Indication: endocarditis  No Known Allergies  Patient Measurements: Height:  (188 cm) Weight: 265 lb 9.6 oz (120.475 kg) IBW/kg (Calculated) : 82.2  Vital Signs: Temp: 98 F (36.7 C) (02/14 0430) Temp Source: Oral (02/14 0430) BP: 139/79 mmHg (02/14 0430) Pulse Rate: 78 (02/14 0430) Intake/Output from previous day: 02/13 0701 - 02/14 0700 In: 780 [P.O.:480; IV Piggyback:300] Out: 1250 [Urine:1250] Intake/Output from this shift: Total I/O In: 480 [P.O.:480] Out: 800 [Urine:800]  Labs:  Recent Labs  02/10/15 1945 02/11/15 0408 02/12/15 0500 02/13/15 0445  WBC 5.4 4.9  --  4.5  HGB 8.7* 8.6*  --  8.5*  PLT 294 275  --  288  CREATININE  --  2.48* 3.26* 3.93*   Estimated Creatinine Clearance: 36.5 mL/min (by C-G formula based on Cr of 3.93). No results for input(s): VANCOTROUGH, VANCOPEAK, VANCORANDOM, GENTTROUGH, GENTPEAK, GENTRANDOM, TOBRATROUGH, TOBRAPEAK, TOBRARND, AMIKACINPEAK, AMIKACINTROU, AMIKACIN in the last 72 hours.   Microbiology: Recent Results (from the past 720 hour(s))  Culture, blood (routine x 2)     Status: None   Collection Time: 01/18/15  6:10 PM  Result Value Ref Range Status   Specimen Description BLOOD LEFT ARM  Final   Special Requests BOTTLES DRAWN AEROBIC AND ANAEROBIC 10CC  Final   Culture   Final    NO GROWTH 5 DAYS Performed at [facility]    Report Status 01/25/2015 FINAL  Final  Culture, blood (routine x 2)     Status: None   Collection Time: 01/18/15  6:18 PM  Result Value Ref Range Status   Specimen Description BLOOD RIGHT ARM  Final   Special Requests BOTTLES DRAWN AEROBIC AND ANAEROBIC 10CC  Final   Culture   Final    NO GROWTH 5 DAYS Performed at [facility]    Report Status 01/25/2015 FINAL  Final  Surgical pcr screen     Status: None   Collection Time: 01/25/15  4:47 AM  Result Value Ref Range Status   MRSA, PCR NEGATIVE  NEGATIVE Final   Staphylococcus aureus NEGATIVE NEGATIVE Final    Comment:        The Xpert SA Assay (FDA approved for NASAL specimens in patients over 32 years of age), is one component of a comprehensive surveillance program.  Test performance has been validated by [facility] for patients greater than or equal to 38 year old. It is not intended to diagnose infection nor to guide or monitor treatment.   Culture, blood (routine x 2)     Status: None   Collection Time: 02/02/15  3:53 PM  Result Value Ref Range Status   Specimen Description BLOOD PICC LINE  Final   Special Requests BOTTLES DRAWN AEROBIC AND ANAEROBIC  Final   Culture   Final    NO GROWTH 5 DAYS Performed at [facility]    Report Status 02/08/2015 FINAL  Final  Culture, blood (routine x 2)     Status: None   Collection Time: 02/02/15  4:32 PM  Result Value Ref Range Status   Specimen Description BLOOD PICC LINE  Final   Special Requests BOTTLES DRAWN AEROBIC AND ANAEROBIC 10CC  Final   Culture   Final    NO GROWTH 5 DAYS Performed at [facility]    Report Status 02/08/2015 FINAL  Final  MRSA PCR Screening     Status: None   Collection Time: 02/02/15  5:14 PM  Result Value Ref Range Status   MRSA by PCR NEGATIVE NEGATIVE Final    Comment:        The GeneXpert MRSA Assay (FDA approved for NASAL specimens only), is one component of a comprehensive MRSA colonization surveillance program. It is not intended to diagnose MRSA infection nor to guide or monitor treatment for MRSA infections.   Body fluid culture     Status: None   Collection Time: 02/04/15  5:45 PM  Result Value Ref Range Status   Specimen Description SYNOVIAL FLUID SHOULDER RIGHT  Final   Special Requests NONE  Final   Gram Stain   Final    FEW WBC PRESENT, PREDOMINANTLY PMN NO ORGANISMS SEEN Performed at [facility]    Culture   Final    NO GROWTH 3 DAYS Performed at [facility]    Report  Status 02/08/2015 FINAL  Final  Surgical pcr screen     Status: None   Collection Time: 02/05/15  5:06 AM  Result Value Ref Range Status   MRSA, PCR NEGATIVE NEGATIVE Final   Staphylococcus aureus NEGATIVE NEGATIVE Final    Comment:        The Xpert SA Assay (FDA approved for NASAL specimens in patients over 35 years of age), is one component of a comprehensive surveillance program.  Test performance has been validated by [facility] for patients greater than or equal to 35 year old. It is not intended to diagnose infection nor to guide or monitor treatment.     Anti-infectives    Start     Dose/Rate Route Frequency Ordered Stop   02/02/15 1700  ampicillin (OMNIPEN) 2 g in sodium chloride 0.9 % 50 mL IVPB     2 g 150 mL/hr over 20 Minutes Intravenous Every 4 hours 02/02/15 1638        Assessment: 35 yo M presents on 2/3 with CP. 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 Pharmacy to continue dosing ampicillin for endocarditis with perforation, septic emboli to the brain and right septic shoulder and thoracic spine discitis. Now s/p arthroscopic debridement and subacromial decompression for septic right shoulder.  ID did see last admit (1/29) and plans were 6 weeks of ampicillin with day 1 being 1/14. Of note, pt was on a once a day ampicillin 12g over 24hrs for ease of administration.   Patient remains afebrile with WBC wnl. SCr continues to trend up to 3.93 today (CrCl ~36 ml/min).   1/14 ampicillin (6wks)>>  2/3 blood x2>>negative 2/5 Synovial fluid>>negative   Plan:  - Adjust ampicillin to 2 gm IV q6h with worsening renal dysfunction - Continue to monitor renal function, temp, WBC - Cardiac surgery Tuesday  Joelene Barriere K. Bonnye FavaNicolsen, PharmD Clinical Pharmacist - Resident Pager: [phone] Pharmacy: [phone] 02/13/2015 1:06 PM

## 2015-02-13 NOTE — Progress Notes (Signed)
PROGRESS NOTE  Rob HickmanBenjamin Bywater NGE:[id number] DOB: 05/22/1980 DOA: 02/02/2015 PCP: No PCP Per Patient  HPI/Recap of past 24 hours: s/p right shoulder washout. Post op day 8. Denies CP, SOB and fever. Still Complaining of pain right shoulder with movement. BP still elevated but better. Patient had a BM  Assessment/Plan: Principal Problem:   Acute diastolic CHF (congestive heart failure), NYHA class 3 Active Problems:   Diabetes type 2, uncontrolled   Uncontrolled hypertension   Acute renal failure   Severe mitral regurgitation   Endocarditis   Anasarca   Arthritis, septic, shoulder   Chronic renal insufficiency, stage III (moderate)   Acute renal failure superimposed on stage 3 chronic kidney disease   Bacterial endocarditis   Edema   HTN (hypertension)   Acute on chronic diastolic heart failure   Facial numbness   Vertebral osteomyelitis   Septic arthritis of shoulder, right  1. Acute on chronic diastolic dysfunction EF 60%. 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 Also with diet and meds non-compliance. -Cr jump to 3.2; will hold lasix again today -Will continue coreg/imdur/hydralazine -Patient encouraged to follow low sodium diet -Will continue daily weights and strict I's and O's -weight is slightly elevated  2. Endocarditis: Continue IV ampicillin dosed by pharmacy for now. Infectious disease consulted; will follow rec's. Has left arm PICC line. Thoracic surgery consulted and tentatively plan is for surgery on 02/15/15. -Continue patient tune up for potential surgery next week  3. Right shoulder septic arthritis.  -Mri shoulder on 2/5 concerning for septic arthritis/osteomyelitis; Dr. Lajoyce Cornersuda consulted. -2/6 patient was brought to the OR and had: Right shoulder ARTHROSCOPY SHOULDER Extensive debridement of the glenohumeral joint. Subacromial decompression.  -Orthopedic service to help with documentation of depth, type and instrumentation used.  4. Septic  embolic infarcts: due to c/o facial numbness on 2/7, mri brain repeated, results showed: While several of these foci appear to be of slightly different ages, these are new relative to prior MRI from 01/14/2015, where the previously seen infarcts have resolved/normalized. No significant mass effect or parenchymal hemorrhage.  -continue IV antibiotics and hopefully surgery to fix his MV soon  5. Dental caries. Multiple teeth removed one week ago prior to this admission. -Still requiring prn pain meds and unable to chew -will continue dysphagia 3 diet  6. DM type II. Last A1c was above 8.  -will adjust as needed -continue SSI, novolog meal coverage and Levemir  7. Essential hypertension.  -Remains Uncontrolled, but better -following cardiology rec's will start clonidine 0.2 mg twice a day -Will continue hydralazine, norvasc, coreg and Imdur  -low sodium diet encourage -Patient on Lasix 80 mg by mouth starting today to 11 as per cardiology recommendations  8.Acute on Chronic kidney disease stage IV. Baseline creatinine around 2-2.6.  -Cr at 3.9 today (2/14); due to diuresis -will hold lasix today  9. Anemia of chronic disease: Hemoglobin was slightly drifting down most likely due to multiple consecutive phlebotomies -In anticipation for surgery received 1 unit of PRBCs on 2/11 -Hgb remains stable at 8.6 -will follow trend and if needed transfuse another unit of PRBC; plan is to keep Hgb as close to 9 as possible.   Code Status: full  Family Communication: patient and wife at bedside  Disposition Plan: remain inpatient; anticipated surgery next week (per Dr. Cornelius Moraswen 2/16)   Consultants:  Cardiology/cardiothoracic surgery/infectious disease/ortho  Procedures: 2/6 Right shoulder joint ARTHROSCOPY SHOULDER  Extensive debridement of the glenohumeral joint. Subacromial decompression.  Antibiotics:  ampicillin  Objective: BP 139/79 mmHg  Pulse 78  Temp(Src) 98 F (36.7 C)  (Oral)  Resp 18  Ht  (1.88 m)  Wt 120.475 kg (265 lb 9.6 oz)  BMI 34.09 kg/m2  SpO2 92%  Intake/Output Summary (Last 24 hours) at 02/13/15 1326 Last data filed at 02/13/15 1300  Gross per 24 hour  Intake   1210 ml  Output   1900 ml  Net   -690 ml   Filed Weights   02/11/15 0445 02/12/15 0400 02/13/15 1027  Weight: 116.075 kg (255 lb 14.4 oz) 118.207 kg (260 lb 9.6 oz) 120.475 kg (265 lb 9.6 oz)    Exam:   General:  AAOx3, NAD; denies CP. BP still elevated but improved overall. Volume stable (255 pounds)  Cardiovascular: RRR, soft systolic murmur appreciated on exam, no gallops  Respiratory: decreased at bases, no wheezing and no rhonchi  Abdomen: soft/NT/ND, positive bowel sounds  Musculoskeletal: pitting edema bilateral (1-2++), right shoulder dressing intact/decrease range of motion and pain with movement.  Data Reviewed: Basic Metabolic Panel:  Recent Labs Lab 02/08/15 0500 02/09/15 1305 02/10/15 0520 02/11/15 0408 02/12/15 0500 02/13/15 0445  NA 138 137 138 136 136 136  K 3.3* 3.7 4.0 4.0 4.6 4.4  CL 102 99 101 99 96 98  CO2 27 27 32 29 33* 33*  GLUCOSE 182* 284* 183* 224* 187* 120*  BUN 18 21 28* 34* 43* 48*  CREATININE 1.67* 1.96* 2.14* 2.48* 3.26* 3.93*  CALCIUM 8.2* 8.2* 8.4 8.5 8.4 8.3*  MG 1.9  --  2.1 2.4 2.4 2.5   CBC:  Recent Labs Lab 02/10/15 0520 02/10/15 1945 02/11/15 0408 02/13/15 0445  WBC 5.0 5.4 4.9 4.5  NEUTROABS 3.3  --   --   --   HGB 7.8* 8.7* 8.6* 8.5*  HCT 23.9* 26.7* 26.1* 25.8*  MCV 87.5 88.1 85.9 86.9  PLT 287 294 275 288   BNP (last 3 results)  Recent Labs  01/22/15 0347 02/02/15 1035 02/11/15 0510  BNP 218.9* 629.0* 182.2*   CBG:  Recent Labs Lab 02/12/15 1148 02/12/15 1650 02/12/15 2021 02/13/15 0742 02/13/15 1204  GLUCAP 166* 153* 97 109* 150*    Recent Results (from the past 240 hour(s))  Body fluid culture     Status: None   Collection Time: 02/04/15  5:45 PM  Result Value Ref Range  Status   Specimen Description SYNOVIAL FLUID SHOULDER RIGHT  Final   Special Requests NONE  Final   Gram Stain   Final    FEW WBC PRESENT, PREDOMINANTLY PMN NO ORGANISMS SEEN Performed at [facility]    Culture   Final    NO GROWTH 3 DAYS Performed at [facility]    Report Status 02/08/2015 FINAL  Final  Surgical pcr screen     Status: None   Collection Time: 02/05/15  5:06 AM  Result Value Ref Range Status   MRSA, PCR NEGATIVE NEGATIVE Final   Staphylococcus aureus NEGATIVE NEGATIVE Final    Comment:        The Xpert SA Assay (FDA approved for NASAL specimens in patients over 1 years of age), is one component of a comprehensive surveillance program.  Test performance has been validated by [facility] for patients greater than or equal to 50 year old. It is not intended to diagnose infection nor to guide or monitor treatment.      Studies: No results found.  Scheduled Meds: . amLODipine  10 mg Oral Daily  .  ampicillin (OMNIPEN) IV  2 g Intravenous 4 times per day  . carvedilol  50 mg Oral BID WC  . cloNIDine  0.2 mg Oral BID  . docusate sodium  100 mg Oral BID  . feeding supplement (ENSURE COMPLETE)  237 mL Oral TID WC  . hydrALAZINE  100 mg Oral 3 times per day  . Influenza vac split quadrivalent PF  0.5 mL Intramuscular Tomorrow-1000  . insulin aspart  0-9 Units Subcutaneous TID WC  . insulin aspart  4 Units Subcutaneous TID WC  . insulin detemir  10 Units Subcutaneous QHS  . isosorbide mononitrate  60 mg Oral BID  . polyethylene glycol  17 g Oral BID  . sodium chloride  3 mL Intravenous Q12H    Continuous Infusions: . sodium chloride 10 mL/hr at 02/07/15 0132    Time: 30 minutes   Vassie Loll  [facility] Pager [phone]. If 7PM-7AM, please contact night-coverage at [email or web address], password [facility] 02/13/2015, 1:26 PM  LOS: 11 days

## 2015-02-14 DIAGNOSIS — I340 Nonrheumatic mitral (valve) insufficiency: Secondary | ICD-10-CM

## 2015-02-14 LAB — CBC
HCT: 25.5 % — ABNORMAL LOW (ref 39.0–52.0)
Hemoglobin: 8.4 g/dL — ABNORMAL LOW (ref 13.0–17.0)
MCH: 28 pg (ref 26.0–34.0)
MCHC: 32.9 g/dL (ref 30.0–36.0)
MCV: 85 fL (ref 78.0–100.0)
PLATELETS: 283 10*3/uL (ref 150–400)
RBC: 3 MIL/uL — ABNORMAL LOW (ref 4.22–5.81)
RDW: 13.6 % (ref 11.5–15.5)
WBC: 4.3 10*3/uL (ref 4.0–10.5)

## 2015-02-14 LAB — BASIC METABOLIC PANEL
Anion gap: 7 (ref 5–15)
BUN: 45 mg/dL — AB (ref 6–23)
CO2: 32 mmol/L (ref 19–32)
CREATININE: 3.55 mg/dL — AB (ref 0.50–1.35)
Calcium: 8.3 mg/dL — ABNORMAL LOW (ref 8.4–10.5)
Chloride: 101 mmol/L (ref 96–112)
GFR calc Af Amer: 24 mL/min — ABNORMAL LOW (ref >90)
GFR calc non Af Amer: 21 mL/min — ABNORMAL LOW (ref >90)
Glucose, Bld: 141 mg/dL — ABNORMAL HIGH (ref 70–99)
POTASSIUM: 3.8 mmol/L (ref 3.5–5.1)
SODIUM: 140 mmol/L (ref 135–145)

## 2015-02-14 LAB — GLUCOSE, CAPILLARY
GLUCOSE-CAPILLARY: 172 mg/dL — AB (ref 70–99)
GLUCOSE-CAPILLARY: 125 mg/dL — AB (ref 70–99)
Glucose-Capillary: 133 mg/dL — ABNORMAL HIGH (ref 70–99)
Glucose-Capillary: 161 mg/dL — ABNORMAL HIGH (ref 70–99)

## 2015-02-14 LAB — MAGNESIUM: Magnesium: 2.5 mg/dL (ref 1.5–2.5)

## 2015-02-14 MED ORDER — ISOSORBIDE MONONITRATE ER 30 MG PO TB24
90.0000 mg | ORAL_TABLET | Freq: Two times a day (BID) | ORAL | Status: DC
  Administered 2015-02-14 – 2015-02-17 (×7): 90 mg via ORAL
  Filled 2015-02-14 (×15): qty 1

## 2015-02-14 MED ORDER — DARBEPOETIN ALFA 60 MCG/0.3ML IJ SOSY
60.0000 ug | PREFILLED_SYRINGE | INTRAMUSCULAR | Status: DC
  Administered 2015-02-15: 60 ug via SUBCUTANEOUS
  Filled 2015-02-14 (×2): qty 0.3

## 2015-02-14 MED ORDER — FUROSEMIDE 10 MG/ML IJ SOLN
40.0000 mg | Freq: Two times a day (BID) | INTRAMUSCULAR | Status: DC
  Administered 2015-02-14 – 2015-02-16 (×4): 40 mg via INTRAVENOUS
  Filled 2015-02-14 (×4): qty 4

## 2015-02-14 MED ORDER — AMLODIPINE BESYLATE 5 MG PO TABS
5.0000 mg | ORAL_TABLET | Freq: Every day | ORAL | Status: DC
  Administered 2015-02-15 – 2015-02-17 (×3): 5 mg via ORAL
  Filled 2015-02-14 (×3): qty 1

## 2015-02-14 MED ORDER — HYDRALAZINE HCL 50 MG PO TABS
50.0000 mg | ORAL_TABLET | Freq: Three times a day (TID) | ORAL | Status: DC
  Administered 2015-02-14 – 2015-02-17 (×11): 50 mg via ORAL
  Filled 2015-02-14 (×15): qty 1

## 2015-02-14 NOTE — Progress Notes (Signed)
PROGRESS NOTE  Dwayne [name] ZOX:[id number] DOB: 03-05-1980 DOA: 02/02/2015 PCP: No PCP Per Patient  HPI/Recap of past 24 hours: s/p right shoulder washout. Post op day 9. Denies CP, SOB and fever. Still Complaining of pain right shoulder with movement. BP still elevated but much better. Patient has experienced acute on chronic renal failure with Cr up to 3.5 now. Renal consulted and surgery postpone to 2/17 if ok by renal.  Assessment/Plan: Principal Problem:   Acute diastolic CHF (congestive heart failure), NYHA class 3 Active Problems:   Diabetes type 2, uncontrolled   Uncontrolled hypertension   Acute renal failure   Severe mitral regurgitation   Endocarditis   Anasarca   Arthritis, septic, shoulder   Chronic renal insufficiency, stage III (moderate)   Acute renal failure superimposed on stage 3 chronic kidney disease   Bacterial endocarditis   Edema   HTN (hypertension)   Acute on chronic diastolic heart failure   Facial numbness   Vertebral osteomyelitis   Septic arthritis of shoulder, right  1. Acute on chronic diastolic dysfunction EF 60%. 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 Also with diet and meds non-compliance. -Cr jump to 3.9 on 2/14; now trending down; will hold lasix again today (Cr 2/15 3.5) -Will continue coreg/imdur/hydralazine -Patient encouraged to follow low sodium diet -Will continue daily weights and strict I's and O's -weight is slightly elevated at 269 pounds; but scrotal edema is resolved, no crackles on exam, no SOB or orthopnea and no JVD. Still with 2++ edema bilaterally -continue TED hose  2. Endocarditis: Continue IV ampicillin dosed by pharmacy for now. Infectious disease consulted; will follow rec's. Has left arm PICC line.  -Continue patient tune up process for potential surgery on 2/17 now  3. Right shoulder septic arthritis.  -Mri shoulder on 2/5 concerning for septic arthritis/osteomyelitis; Dr. Lajoyce Corners consulted. -2/6  patient was brought to the OR and had: Right shoulder ARTHROSCOPY SHOULDER Extensive debridement of the glenohumeral joint. Subacromial decompression.  -Orthopedic service to help with documentation of depth, type and instrumentation used.  4. Septic embolic infarcts: due to c/o facial numbness on 2/7, mri brain repeated, results showed: While several of these foci appear to be of slightly different ages, these are new relative to prior MRI from 01/14/2015, where the previously seen infarcts have resolved/normalized. No significant mass effect or parenchymal hemorrhage.  -continue IV antibiotics and hopefully surgery to fix his MV soon (potentially 2/17)  5. Dental caries. Multiple teeth removed one week ago prior to this admission. -Still requiring prn pain meds and unable to chew -will continue dysphagia 3 diet  6. DM type II. Last A1c was above 8.  -will adjust as needed -continue SSI, novolog meal coverage and Levemir  7. Essential hypertension.  -Remains Uncontrolled, but a lot better -following cardiology rec's will start clonidine 0.2 mg twice a day -Will continue hydralazine, norvasc, coreg and Imdur (dose adjusted per cardiology rec's for even better BP control) -continue low sodium diet encourage -Patient diuretics on hold given worsening creatinine   8.Acute on Chronic kidney disease stage IV. Baseline creatinine around 2-2.6.  -Cr at 3.9 on (2/14) and now down to 3.5; appears to be secondary to diuresis and BP controlled (as that will be equivalent of decrease perfusion in organs use to high BP) -will continue holding diuretics  -renal service consulted  9. Anemia of chronic disease: Hemoglobin was slightly drifting down most likely due to multiple consecutive phlebotomies. No signs of active bleeding. -In anticipation for  surgery received 1 unit of PRBCs on 2/11 -Hgb remains stable at 8.4 -will follow trend and if needed transfuse another unit of PRBC; plan is to keep  Hgb as close to 9 as possible.   Code Status: full  Family Communication: patient bedside  Disposition Plan: remain inpatient; anticipated surgery next week (per Dr. Cornelius Moras 2/17)   Consultants:  Cardiology  Cardiothoracic surgery  Infectious disease  Orthopedic surgery  Renal service  Procedures: 2/6 Right shoulder joint ARTHROSCOPY SHOULDER  Extensive debridement of the glenohumeral joint. Subacromial decompression.  Antibiotics:  ampicillin   Objective: BP 147/83 mmHg  Pulse 75  Temp(Src) 98.2 F (36.8 C) (Oral)  Resp 18  Ht  (1.88 m)  Wt 122.199 kg (269 lb 6.4 oz)  BMI 34.57 kg/m2  SpO2 96%  Intake/Output Summary (Last 24 hours) at 02/14/15 1222 Last data filed at 02/14/15 1158  Gross per 24 hour  Intake   2130 ml  Output   2475 ml  Net   -345 ml   Filed Weights   02/12/15 0400 02/13/15 1027 02/14/15 0400  Weight: 118.207 kg (260 lb 9.6 oz) 120.475 kg (265 lb 9.6 oz) 122.199 kg (269 lb 6.4 oz)    Exam:   General:  AAOx3, NAD; denies CP. BP still elevated but significantly improved overall. Volume slightly up (265-269)  Cardiovascular: RRR, soft systolic murmur appreciated on exam, no gallops; no JVD  Respiratory: good air movement; no crackles; no wheezing and no rhonchi  Abdomen: soft/NT/ND, positive bowel sounds  Musculoskeletal: pitting edema bilateral (2++), right shoulder dressing intact/decrease range of motion and pain with movement.  Data Reviewed: Basic Metabolic Panel:  Recent Labs Lab 02/10/15 0520 02/11/15 0408 02/12/15 0500 02/13/15 0445 02/14/15 0415 02/14/15 0735  NA 138 136 136 136  --  140  K 4.0 4.0 4.6 4.4  --  3.8  CL 101 99 96 98  --  101  CO2 32 29 33* 33*  --  32  GLUCOSE 183* 224* 187* 120*  --  141*  BUN 28* 34* 43* 48*  --  45*  CREATININE 2.14* 2.48* 3.26* 3.93*  --  3.55*  CALCIUM 8.4 8.5 8.4 8.3*  --  8.3*  MG 2.1 2.4 2.4 2.5 2.5  --    CBC:  Recent Labs Lab 02/10/15 0520 02/10/15 1945  02/11/15 0408 02/13/15 0445 02/14/15 0735  WBC 5.0 5.4 4.9 4.5 4.3  NEUTROABS 3.3  --   --   --   --   HGB 7.8* 8.7* 8.6* 8.5* 8.4*  HCT 23.9* 26.7* 26.1* 25.8* 25.5*  MCV 87.5 88.1 85.9 86.9 85.0  PLT 287 294 275 288 283   BNP (last 3 results)  Recent Labs  01/22/15 0347 02/02/15 1035 02/11/15 0510  BNP 218.9* 629.0* 182.2*   CBG:  Recent Labs Lab 02/13/15 1204 02/13/15 1642 02/13/15 2012 02/14/15 0757 02/14/15 1156  GLUCAP 150* 115* 195* 133* 172*    Recent Results (from the past 240 hour(s))  Body fluid culture     Status: None   Collection Time: 02/04/15  5:45 PM  Result Value Ref Range Status   Specimen Description SYNOVIAL FLUID SHOULDER RIGHT  Final   Special Requests NONE  Final   Gram Stain   Final    FEW WBC PRESENT, PREDOMINANTLY PMN NO ORGANISMS SEEN Performed at [facility]    Culture   Final    NO GROWTH 3 DAYS Performed at [facility]    Report  Status 02/08/2015 FINAL  Final  Surgical pcr screen     Status: None   Collection Time: 02/05/15  5:06 AM  Result Value Ref Range Status   MRSA, PCR NEGATIVE NEGATIVE Final   Staphylococcus aureus NEGATIVE NEGATIVE Final    Comment:        The Xpert SA Assay (FDA approved for NASAL specimens in patients over 35 years of age), is one component of a comprehensive surveillance program.  Test performance has been validated by [facility] for patients greater than or equal to 35 year old. It is not intended to diagnose infection nor to guide or monitor treatment.      Studies: No results found.  Scheduled Meds: . amLODipine  10 mg Oral Daily  . ampicillin (OMNIPEN) IV  2 g Intravenous 4 times per day  . carvedilol  50 mg Oral BID WC  . cloNIDine  0.2 mg Oral BID  . docusate sodium  100 mg Oral BID  . feeding supplement (ENSURE COMPLETE)  237 mL Oral TID WC  . hydrALAZINE  100 mg Oral 3 times per day  . Influenza vac split quadrivalent PF  0.5 mL Intramuscular  Tomorrow-1000  . insulin aspart  0-9 Units Subcutaneous TID WC  . insulin aspart  4 Units Subcutaneous TID WC  . insulin detemir  10 Units Subcutaneous QHS  . isosorbide mononitrate  90 mg Oral BID  . polyethylene glycol  17 g Oral BID  . sodium chloride  3 mL Intravenous Q12H    Continuous Infusions: . sodium chloride 10 mL/hr at 02/07/15 0132    Time: 30 minutes   Vassie LollMadera, Destany Severns  [facility] Pager [phone]. If 7PM-7AM, please contact night-coverage at [email or web address], password [email or web address] 02/14/2015, 12:22 PM  LOS: 12 days

## 2015-02-14 NOTE — Progress Notes (Signed)
1440-1500 Pt stated he has walked twice already today. He looks very sleepy. Gave pt OHS booklet, care guide and wrote how to view pre op video on TV. Pt demonstrated he can get 1500 ml on IS. Discussed sternal precautions and demonstrated for pt how to get up and down without use of arms. Pt stated he has someone to help take care of him after surgery. Will follow up as time permits. Luetta NuttingCharlene Virat Prather RN BSN 02/14/2015 2:57 PM

## 2015-02-14 NOTE — Consult Note (Signed)
Dwayne [name] Renal Consultation Note  Requesting MD: Dyann Kief. Roxy Manns Indication for Consultation: A on CRF  HPI:  Dwayne [name] is a 35 y.o. male with past medical history significant for type 2 diabetes mellitus, poorly controlled hypertension, no primary care physician and a poor social situation. 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 His creatinine during that hospitalization initially was above 3 but then settled down into what I determined to be his baseline in the high ones to 2. He was discharged on January 29 with a creatinine of 2.07. He then returned on February 3 noted to be significantly volume overloaded with a creatinine of 1.7. He's had extremely variable blood pressures while hospitalized creatinine bumped a little bit on February 10 and then starting on the 11th has bumped daily to a creatinine of 3.93 on February 14 but is 3.55 today. He did have a couple of very high blood pressures on the 11th which then quickly came into normal range.  Urine output did drop off a little around that time but has mainly stayed greater than 1 L per 24 hours. He had an elevated CO2 level indicating possible overdiuresis and diuretics were backed down.  Urinalysis has not been checked since January. Albumin is very low, indicating that he is likely third spacing fluid. Internal medicine as well as CTS is contacted me today. CTS is most interested in when would be the appropriate time to take this patient to surgery for valve replacement.  CREAT  Date/Time Value Ref Range Status  03/16/2014 12:39 PM 1.24 0.50 - 1.35 mg/dL Final   CREATININE, SER  Date/Time Value Ref Range Status  02/14/2015 07:35 AM 3.55* 0.50 - 1.35 mg/dL Final  02/13/2015 04:45 AM 3.93* 0.50 - 1.35 mg/dL Final  02/12/2015 05:00 AM 3.26* 0.50 - 1.35 mg/dL Final   02/11/2015 04:08 AM 2.48* 0.50 - 1.35 mg/dL Final  02/10/2015 05:20 AM 2.14* 0.50 - 1.35 mg/dL Final  02/09/2015 01:05 PM 1.96* 0.50 - 1.35 mg/dL Final  02/08/2015 05:00 AM 1.67* 0.50 - 1.35 mg/dL Final  02/07/2015 05:43 AM 1.80* 0.50 - 1.35 mg/dL Final  02/06/2015 05:00 AM 1.67* 0.50 - 1.35 mg/dL Final  02/04/2015 05:45 AM 1.81* 0.50 - 1.35 mg/dL Final  02/03/2015 05:50 AM 1.78* 0.50 - 1.35 mg/dL Final  02/02/2015 10:35 AM 1.73* 0.50 - 1.35 mg/dL Final  01/28/2015 04:51 AM 2.07* 0.50 - 1.35 mg/dL Final  01/27/2015 05:55 AM 2.24* 0.50 - 1.35 mg/dL Final  01/26/2015 07:48 AM 2.42* 0.50 - 1.35 mg/dL Final  01/25/2015 05:05 AM 2.38* 0.50 - 1.35 mg/dL Final  01/24/2015 04:50 AM 2.10* 0.50 - 1.35 mg/dL Final  01/23/2015 03:07 AM 2.27* 0.50 - 1.35 mg/dL Final  01/22/2015 12:32 PM 2.16* 0.50 - 1.35 mg/dL Final  01/21/2015 04:18 AM 2.12* 0.50 - 1.35 mg/dL Final  01/20/2015 04:33 AM 1.96* 0.50 - 1.35 mg/dL Final  01/19/2015 03:21 AM 2.20* 0.50 - 1.35 mg/dL Final  01/18/2015 03:57 AM 2.13* 0.50 - 1.35 mg/dL Final  01/17/2015 05:35 AM 1.86* 0.50 - 1.35 mg/dL Final  01/16/2015 05:09 AM 1.99* 0.50 - 1.35 mg/dL Final  01/15/2015 03:40 PM 2.19* 0.50 - 1.35 mg/dL Final  01/14/2015 05:18 AM 2.66* 0.50 - 1.35 mg/dL Final  01/13/2015 05:03 AM 3.05* 0.50 - 1.35 mg/dL Final  01/12/2015 06:14 AM 3.15* 0.50 - 1.35 mg/dL Final  01/11/2015 02:35 AM 2.52* 0.50 - 1.35 mg/dL Final  01/10/2015 05:08 AM 2.15* 0.50 - 1.35 mg/dL Final  01/09/2015 05:00 AM 1.86* 0.50 - 1.35 mg/dL Final  01/09/2015 12:21 AM 2.06* 0.50 - 1.35 mg/dL Final  01/18/2014 05:01 AM 1.16 0.50 - 1.35 mg/dL Final  01/17/2014 05:00 AM 1.10 0.50 - 1.35 mg/dL Final  01/16/2014 05:03 AM 1.30 0.50 - 1.35 mg/dL Final  01/15/2014 02:05 AM 1.13 0.50 - 1.35 mg/dL Final  12/01/2013 08:51 AM 1.17 0.50 - 1.35 mg/dL Final  10/26/2013 01:29 PM 1.37* 0.50 - 1.35 mg/dL Final  05/18/2013 01:44 PM 1.22 0.50 - 1.35 mg/dL Final     PMHx:   Past Medical  History  Diagnosis Date  . Hypertension   . Acute renal failure   . Bacteremia     MSSA   . Diabetes type 2, uncontrolled 01/09/2015  . 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 Essential hypertension, benign 01/18/2014  . Infective myositis of left thigh   . Noncompliance 01/18/2014  . Septic shock   . Staphylococcus aureus bacteremia with sepsis   . Tobacco abuse 01/15/2014  . Mitral regurgitation 01/13/2015    Severe by TEE  . MSSA (methicillin susceptible Staphylococcus aureus)   . Type II diabetes mellitus     not controlled  . Diabetic foot infection 01/15/2014    Past Surgical History  Procedure Laterality Date  . I&d extremity Bilateral 01/16/2014    Procedure:  DEBRIDEMENT of bilateral foot ulcers;  Surgeon: [name], MD;  Location: [place];  Service: Orthopedics;  Laterality: Bilateral;  . Tee without cardioversion N/A 01/13/2015    Procedure: TRANSESOPHAGEAL ECHOCARDIOGRAM (TEE);  Surgeon: [name], MD;  [facility] ENDOSCOPY;  Service: Cardiovascular;  Laterality: N/A;  . Left and right heart catheterization with coronary angiogram N/A 01/24/2015    Procedure: LEFT AND RIGHT HEART CATHETERIZATION WITH CORONARY ANGIOGRAM;  Surgeon: [name], MD;  Location: [facility] CATH LAB;  Service: Cardiovascular;  Laterality: N/A;  . Multiple extractions with alveoloplasty N/A 01/25/2015    Procedure: Extraction of tooth #'s 1,2,3,4,5,6,8,9,11,12,13,14,15,16,17,18,19 28,31,32 with alveoloplasty and gross debridement of remaining teeth.;  Surgeon: [name], DDS;  Location: [place];  Service: Oral Surgery;  Laterality: N/A;  . Cardiac catheterization  12/2014  . Shoulder arthroscopy Right 02/05/2015    Procedure: ARTHROSCOPY SHOULDER;  Surgeon: [name], MD;  Location: [place];  Service: Orthopedics;  Laterality: Right;    Family Hx: History reviewed. No pertinent family history.  Social History:  reports that he quit smoking about 5 weeks ago. His smoking use included  Cigars. He has never used smokeless tobacco. He reports that he drinks alcohol. He reports that he does not use illicit drugs.  Allergies: No Known Allergies  Medications: Prior to Admission medications   Medication Sig Start Date End Date Taking? Authorizing Provider  ampicillin in sodium chloride 0.9 % 50 mL 12g continuous infusion via CAP pump with minimal fluid volume 01/28/15  Yes [name], DO  furosemide (LASIX) 40 MG tablet Take 0.5 tablets (20 mg total) by mouth daily. 01/28/15  Yes [name], DO  hydrALAZINE (APRESOLINE) 25 MG tablet Take 1 tablet (25 mg total) by mouth every 8 (eight) hours. 01/28/15  Yes [name], DO  insulin aspart protamine- aspart (NOVOLOG MIX 70/30) (70-30) 100 UNIT/ML injection Inject 0.14 mLs (14 Units total) into the skin 2 (two) times daily with a meal. 01/28/15  Yes [name], DO  Insulin Syringe-Needle U-100 28G  X 1/2" 0.5 ML MISC Use as directed. Any generic available. 01/28/15  Yes [name], DO  Lancets (FREESTYLE) lancets Any generic available. Use as instructed 01/28/15  Yes [name], DO  metoprolol tartrate (LOPRESSOR) 25 MG tablet Take 3 tablets (75 mg total) by mouth 2 (two) times daily. 01/28/15  Yes [name], DO  oxyCODONE (OXY IR/ROXICODONE) 5 MG immediate release tablet Take 1 tablet (5 mg total) by mouth every 4 (four) hours as needed for moderate pain or severe pain. 01/28/15  Yes [name], DO  Amino Acids-Protein Hydrolys (FEEDING SUPPLEMENT, PRO-STAT SUGAR FREE 64,) LIQD Take 30 mLs by mouth daily. Patient not taking: Reported on 02/02/2015 01/28/15   Cristal Ford, DO  feeding supplement, GLUCERNA SHAKE, (GLUCERNA SHAKE) LIQD Take 237 mLs by mouth 2 (two) times daily between meals. Patient not taking: Reported on 02/02/2015 01/28/15   Velta Addison Mikhail, DO  nicotine (NICODERM CQ - DOSED IN MG/24 HR) 7 mg/24hr patch Place 1 patch (7 mg total) onto the skin daily. Patient not taking: Reported on 02/02/2015  01/28/15   Velta Addison Mikhail, DO  polyethylene glycol (MIRALAX / GLYCOLAX) packet Take 17 g by mouth daily. Patient not taking: Reported on 02/02/2015 01/28/15   [name], DO  senna-docusate (SENOKOT-S) 8.6-50 MG per tablet Take 2 tablets by mouth 2 (two) times daily. Patient not taking: Reported on 02/02/2015 01/28/15   Cristal Ford, DO    I have reviewed the patient's current medications.  Labs:  Results for orders placed or performed during the hospital encounter of 02/02/15 (from the past 48 hour(s))  Glucose, capillary     Status: Abnormal   Collection Time: 02/12/15  4:50 PM  Result Value Ref Range   Glucose-Capillary 153 (H) 70 - 99 mg/dL  Glucose, capillary     Status: None   Collection Time: 02/12/15  8:21 PM  Result Value Ref Range   Glucose-Capillary 97 70 - 99 mg/dL  Magnesium     Status: None   Collection Time: 02/13/15  4:45 AM  Result Value Ref Range   Magnesium 2.5 1.5 - 2.5 mg/dL  Basic metabolic panel     Status: Abnormal   Collection Time: 02/13/15  4:45 AM  Result Value Ref Range   Sodium 136 135 - 145 mmol/L   Potassium 4.4 3.5 - 5.1 mmol/L   Chloride 98 96 - 112 mmol/L   CO2 33 (H) 19 - 32 mmol/L   Glucose, Bld 120 (H) 70 - 99 mg/dL   BUN 48 (H) 6 - 23 mg/dL   Creatinine, Ser 3.93 (H) 0.50 - 1.35 mg/dL   Calcium 8.3 (L) 8.4 - 10.5 mg/dL   GFR calc non Af Amer 18 (L) >90 mL/min   GFR calc Af Amer 21 (L) >90 mL/min    Comment: (NOTE) The eGFR has been calculated using the CKD EPI equation. This calculation has not been validated in all clinical situations. eGFR's persistently <90 mL/min signify possible Chronic Kidney Disease.    Anion gap 5 5 - 15  CBC     Status: Abnormal   Collection Time: 02/13/15  4:45 AM  Result Value Ref Range   WBC 4.5 4.0 - 10.5 K/uL   RBC 2.97 (L) 4.22 - 5.81 MIL/uL   Hemoglobin 8.5 (L) 13.0 - 17.0 g/dL   HCT 25.8 (L) 39.0 - 52.0 %   MCV 86.9 78.0 - 100.0 fL   MCH 28.6 26.0 - 34.0 pg   MCHC 32.9 30.0 - 36.0 g/dL  RDW  13.8 11.5 - 15.5 %   Platelets 288 150 - 400 K/uL  Glucose, capillary     Status: Abnormal   Collection Time: 02/13/15  7:42 AM  Result Value Ref Range   Glucose-Capillary 109 (H) 70 - 99 mg/dL  Glucose, capillary     Status: Abnormal   Collection Time: 02/13/15 12:04 PM  Result Value Ref Range   Glucose-Capillary 150 (H) 70 - 99 mg/dL  Glucose, capillary     Status: Abnormal   Collection Time: 02/13/15  4:42 PM  Result Value Ref Range   Glucose-Capillary 115 (H) 70 - 99 mg/dL  Glucose, capillary     Status: Abnormal   Collection Time: 02/13/15  8:12 PM  Result Value Ref Range   Glucose-Capillary 195 (H) 70 - 99 mg/dL  Magnesium     Status: None   Collection Time: 02/14/15  4:15 AM  Result Value Ref Range   Magnesium 2.5 1.5 - 2.5 mg/dL  Basic metabolic panel     Status: Abnormal   Collection Time: 02/14/15  7:35 AM  Result Value Ref Range   Sodium 140 135 - 145 mmol/L   Potassium 3.8 3.5 - 5.1 mmol/L   Chloride 101 96 - 112 mmol/L   CO2 32 19 - 32 mmol/L   Glucose, Bld 141 (H) 70 - 99 mg/dL   BUN 45 (H) 6 - 23 mg/dL   Creatinine, Ser 3.55 (H) 0.50 - 1.35 mg/dL   Calcium 8.3 (L) 8.4 - 10.5 mg/dL   GFR calc non Af Amer 21 (L) >90 mL/min   GFR calc Af Amer 24 (L) >90 mL/min    Comment: (NOTE) The eGFR has been calculated using the CKD EPI equation. This calculation has not been validated in all clinical situations. eGFR's persistently <90 mL/min signify possible Chronic Kidney Disease.    Anion gap 7 5 - 15  CBC     Status: Abnormal   Collection Time: 02/14/15  7:35 AM  Result Value Ref Range   WBC 4.3 4.0 - 10.5 K/uL   RBC 3.00 (L) 4.22 - 5.81 MIL/uL   Hemoglobin 8.4 (L) 13.0 - 17.0 g/dL   HCT 25.5 (L) 39.0 - 52.0 %   MCV 85.0 78.0 - 100.0 fL   MCH 28.0 26.0 - 34.0 pg   MCHC 32.9 30.0 - 36.0 g/dL   RDW 13.6 11.5 - 15.5 %   Platelets 283 150 - 400 K/uL  Glucose, capillary     Status: Abnormal   Collection Time: 02/14/15  7:57 AM  Result Value Ref Range    Glucose-Capillary 133 (H) 70 - 99 mg/dL  Glucose, capillary     Status: Abnormal   Collection Time: 02/14/15 11:56 AM  Result Value Ref Range   Glucose-Capillary 172 (H) 70 - 99 mg/dL     ROS:  A comprehensive review of systems was negative except for: Ears, nose, mouth, throat, and face: positive for nasal congestion Cardiovascular: positive for lower extremity edema  Physical Exam: Filed Vitals:   02/14/15 1157  BP: 147/83  Pulse: 75  Temp: 98.2 F (36.8 C)  Resp: 18     General: Reasonably appearing black male sitting on side of the bed. He is in no acute distress  HEENT: Pupils are equal round reactive to light, extra ocular motions are intact, mucous members are moist he is status post teeth extraction  Neck: Positive for JVD  Heart: Regular rate and rhythm without murmur  Lungs: Decreased breath sounds  on the at the extreme bases  Abdomen: Soft, nontender, nondistended  Extremities: Pitting edema up to the level of the mid thighs  Skin: Warm and dry  Neuro: Slightly slow thought processes   Assessment/Plan: 35 year old black male with history of poorly controlled hypertension diagnosed last month with MSSA bacteremia  and  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 He also has some CK D with baseline creatinine in the high ones up to 2 with recent acute on chronic renal failure brought on by diuresis and likely decreased renal profusion.  1.Renal-  I suspect  that he likely has some baseline renal insufficiency and CKD on the basis of his DM and HTN. With common things being common, I suspect  that his most recent acute on chronic renal failure is a result of relative hypotension and decreased renal perfusion. It is true that this was in the setting of diuresis but I don't believe that he was over diuresed per se. Fortunately, his renal function appears to be heading in the right direction at this time. I will check a urinalysis to rule out  other things. I do not detect any obvious nephrotoxins.  Although a septic thrombus to the kidneys may be in the differential given the history, I am less suspicious of it.    I believe that he does need some diuresis and will actually decrease some of his other antihypertensives so as not to drop his blood pressure too much.  I would like to see his creatinine closer to 2 before he's actually taken to the OR to lessen his risk of postoperative acute on chronic renal failure. Not sure if this will happen by Wednesday or not.  2. Hypertension/volume  - he has massive edema. Not sure how much this has to do with his mitral regurg, he has hypoalbuminemia which is going to make him more prone to edema and is going to be difficult fix at this juncture.  Will check albumin again, he is on a supplement 3. Anemia  - is not helping his volume status either, will add ESA to help boost  Dwayne [name] A 02/14/2015, 1:17 PM

## 2015-02-14 NOTE — Progress Notes (Addendum)
[address]       [place] [address]             [phone]     CARDIOTHORACIC SURGERY PROGRESS NOTE  9 Days Post-Op  S/P Procedure(s) (LRB): ARTHROSCOPY SHOULDER (Right)  Subjective: No complaints.  Shoulder still sore but perhaps slightly improved.  Denies SOB.  Eating okay. Not walking much.  Complains that legs feel heavy.  Scrotal edema resolved.  Objective: Vital signs in last 24 hours: Temp:  [97.7 F (36.5 C)-98.6 F (37 C)] 98 F (36.7 C) (02/15 0759) Pulse Rate:  [67-79] 76 (02/15 0759) Cardiac Rhythm:  [-] Normal sinus rhythm (02/14 2000) Resp:  [18] 18 (02/15 0759) BP: (132-143)/(77-86) 143/81 mmHg (02/15 0759) SpO2:  [95 %-100 %] 96 % (02/15 0759) Weight:  [120.475 kg (265 lb 9.6 oz)-122.199 kg (269 lb 6.4 oz)] 122.199 kg (269 lb 6.4 oz) (02/15 0400)  Physical Exam:  Rhythm:   sinus  Breath sounds: clear  Heart sounds:  RRR w/ systolic murmur  Incisions:  n/a  Abdomen:  soft  Extremities:  Warm, well-perfused, 2+ edema   Intake/Output from previous day: 02/14 0701 - 02/15 0700 In: 2130 [P.O.:1200; I.V.:830; IV Piggyback:100] Out: 1950 [Urine:1950] Intake/Output this shift: Total I/O In: -  Out: 400 [Urine:400]  Lab Results:  Recent Labs  02/13/15 0445 02/14/15 0735  WBC 4.5 4.3  HGB 8.5* 8.4*  HCT 25.8* 25.5*  PLT 288 283   BMET:  Recent Labs  02/12/15 0500 02/13/15 0445  NA 136 136  K 4.6 4.4  CL 96 98  CO2 33* 33*  GLUCOSE 187* 120*  BUN 43* 48*  CREATININE 3.26* 3.93*  CALCIUM 8.4 8.3*    CBG (last 3)   Recent Labs  02/13/15 1642 02/13/15 2012 02/14/15 0757  GLUCAP 115* 195* 133*   PT/INR:  No results for input(s): LABPROT, INR in the last 72 hours.  CXR:  CHEST 2 VIEW  COMPARISON: Single transit PA and lateral chest 02/02/2015 in 01/14/2015.  FINDINGS: There small bilateral pleural effusions with mild basilar atelectasis. Heart size is enlarged but no evidence of pulmonary edema is  identified. Patchy airspace disease in the upper lung zones seen on the most recent examination are no longer identified. Small pleural effusions are noted.  IMPRESSION: Patchy bilateral upper lobe airspace disease most examination has resolved.  Cardiomegaly and small effusions without evidence of pulmonary edema.   Electronically Signed  By: Drusilla Kanner M.D.  On: 02/11/2015 09:51  Assessment/Plan: S/P Procedure(s) (LRB): ARTHROSCOPY SHOULDER (Right)  [name] remains clinically stable w/ respect to bacterial endocarditis and severe mitral regurgitation.  No fevers.  WBC and platelet count normal.  Lungs clear and no other signs of CHF other than lower extremity edema.  Unfortunately his renal function has not stabilized despite holding diuretics for several days.  Creatinine up to 3.9 yesterday, today's follow up pending.  Severe proteinuria on previous UA.  He will be at very high risk for the development of acute renal failure after mitral valve repair/replacement.  Although I share concerns expressed by Dr Daiva Eves regarding the need for surgery sooner rather than later, I think [name] needs to be seen in consultation again by Nephrology.  We will postpone plans for surgery at least until Wednesday and await their input.  If they feel there's little benefit to waiting longer we will proceed with surgery Wednesday.  Alternatively we could wait until Friday or some  time next week.  Discussed with patient who seems to understand.  I have offered to meet with his family this afternoon to discuss further.  All questions answered.  I spent in excess of 20 minutes during the conduct of this hospital encounter and >50% of this time involved direct face-to-face encounter with the patient for counseling and/or coordination of their care.   Carmen Tolliver H 02/14/2015 8:29 AM   Addendum:  Serum creatinine appears to have hit a plateau -  3.5 today down from peak 3.9  yesterday.  Will discuss timing of surgery with Nephrology team.  Purcell NailsWEN,Sylvester Minton H 02/14/2015 8:50 AM  ADDENDUM:  I have discussed matters at length with the patient and his wife this evening.  They were counseled at length regarding the indications, risks and potential benefits of mitral valve repair or replacement.  The rationale for elective surgery has been explained, including a comparison between surgery and continued medical therapy with close follow-up.  The timing of surgery was discussed both as it relates to his underlying endocarditis (including risks of persistent/recurrent infection after surgery and risks of recurrent embolization or other complications prior to surgery) and as it relates to his renal insufficiency (with risk of acute renal failure postoperatively).  The likelihood of successful and durable valve repair has been discussed with particular reference to the findings of their most recent echocardiogram.  Based upon these findings and previous experience in patients with Staph aureus bacterial endocarditis, I have quoted them a less than 50 percent likelihood of successful valve repair.  In the event that their valve cannot be successfully repaired, we discussed the possibility of replacing the mitral valve using a mechanical prosthesis with the attendant need for long-term anticoagulation versus the alternative of replacing it using a bioprosthetic tissue valve with its potential for late structural valve deterioration and failure, depending upon the patient's longevity.    All of their questions have been addressed.  Will continue to follow closely and hopefully make final plans for surgery within the next few days depending on his renal function.   Purcell NailsOWEN,Alleta Avery H 02/14/2015 5:47 PM

## 2015-02-14 NOTE — Progress Notes (Signed)
[facility]    Subjective:   No complaints today Antibiotics:  Anti-infectives    Start     Dose/Rate Route Frequency Ordered Stop   02/13/15 1800  ampicillin (OMNIPEN) 2 g in sodium chloride 0.9 % 50 mL IVPB     2 g 150 mL/hr over 20 Minutes Intravenous 4 times per day 02/13/15 1309     02/02/15 1700  ampicillin (OMNIPEN) 2 g in sodium chloride 0.9 % 50 mL IVPB  Status:  Discontinued     2 g 150 mL/hr over 20 Minutes Intravenous Every 4 hours 02/02/15 1638 02/13/15 1309      Medications: Scheduled Meds: . amLODipine  10 mg Oral Daily  . ampicillin (OMNIPEN) IV  2 g Intravenous 4 times per day  . carvedilol  50 mg Oral BID WC  . cloNIDine  0.2 mg Oral BID  . docusate sodium  100 mg Oral BID  . feeding supplement (ENSURE COMPLETE)  237 mL Oral TID WC  . hydrALAZINE  100 mg Oral 3 times per day  . Influenza vac split quadrivalent PF  0.5 mL Intramuscular Tomorrow-1000  . insulin aspart  0-9 Units Subcutaneous TID WC  . insulin aspart  4 Units Subcutaneous TID WC  . insulin detemir  10 Units Subcutaneous QHS  . isosorbide mononitrate  90 mg Oral BID  . polyethylene glycol  17 g Oral BID  . sodium chloride  3 mL Intravenous Q12H   Continuous Infusions: . sodium chloride 10 mL/hr at 02/07/15 0132   PRN Meds:.acetaminophen, alum & mag hydroxide-simeth, bisacodyl, guaiFENesin-dextromethorphan, hydrALAZINE, magnesium hydroxide, methocarbamol **OR** methocarbamol (ROBAXIN)  IV, metoCLOPramide **OR** metoCLOPramide (REGLAN) injection, ondansetron **OR** ondansetron (ZOFRAN) IV, oxyCODONE-acetaminophen, senna-docusate, simethicone, sodium chloride    Objective: Weight change:   Intake/Output Summary (Last 24 hours) at 02/14/15 1353 Last data filed at 02/14/15 1158  Gross per 24 hour  Intake   1890 ml  Output   2175 ml  Net   -285 ml   Blood pressure 147/83, pulse 75, temperature 98.2 F (36.8 C), temperature source Oral, resp. rate 18, height   (1.88 m), weight 269 lb 6.4 oz (122.199 kg), SpO2 96 %. Temp:  [97.7 F (36.5 C)-98.6 F (37 C)] 98.2 F (36.8 C) (02/15 1157) Pulse Rate:  [67-79] 75 (02/15 1157) Resp:  [18] 18 (02/15 1157) BP: (132-147)/(77-86) 147/83 mmHg (02/15 1157) SpO2:  [95 %-100 %] 96 % (02/15 1157) Weight:  [269 lb 6.4 oz (122.199 kg)] 269 lb 6.4 oz (122.199 kg) (02/15 0400)  Physical Exam: General: aox3  sleeping comfortably but awakens to voice CV: Systolic murmur loudest at PMI with radiation to axilla Pulm: clear, no wheezes or rhonchi Ext; edematous Neuro; non focal CBC:   CBC Latest Ref Rng 02/14/2015 02/13/2015 02/11/2015  WBC 4.0 - 10.5 K/uL 4.3 4.5 4.9  Hemoglobin 13.0 - 17.0 g/dL 8.6(V) 7.8(I) 6.9(G)  Hematocrit 39.0 - 52.0 % 25.5(L) 25.8(L) 26.1(L)  Platelets 150 - 400 K/uL 283 288 275     BMET  Recent Labs  02/13/15 0445 02/14/15 0735  NA 136 140  K 4.4 3.8  CL 98 101  CO2 33* 32  GLUCOSE 120* 141*  BUN 48* 45*  CREATININE 3.93* 3.55*  CALCIUM 8.3* 8.3*     Liver Panel  No results for input(s): PROT, ALBUMIN, AST, ALT, ALKPHOS, BILITOT, BILIDIR, IBILI in the last 72 hours.     Sedimentation Rate No results for input(s): ESRSEDRATE in the last  72 hours. C-Reactive Protein No results for input(s): CRP in the last 72 hours.  Micro Results: Recent Results (from the past 720 hour(s))  Culture, blood (routine x 2)     Status: None   Collection Time: 01/18/15  6:10 PM  Result Value Ref Range Status   Specimen Description BLOOD LEFT ARM  Final   Special Requests BOTTLES DRAWN AEROBIC AND ANAEROBIC 10CC  Final   Culture   Final    NO GROWTH 5 DAYS Performed at [facility]    Report Status 01/25/2015 FINAL  Final  Culture, blood (routine x 2)     Status: None   Collection Time: 01/18/15  6:18 PM  Result Value Ref Range Status   Specimen Description BLOOD RIGHT ARM  Final   Special Requests BOTTLES DRAWN AEROBIC AND ANAEROBIC 10CC  Final   Culture    Final    NO GROWTH 5 DAYS Performed at [facility]    Report Status 01/25/2015 FINAL  Final  Surgical pcr screen     Status: None   Collection Time: 01/25/15  4:47 AM  Result Value Ref Range Status   MRSA, PCR NEGATIVE NEGATIVE Final   Staphylococcus aureus NEGATIVE NEGATIVE Final    Comment:        The Xpert SA Assay (FDA approved for NASAL specimens in patients over 40 years of age), is one component of a comprehensive surveillance program.  Test performance has been validated by [facility] for patients greater than or equal to 72 year old. It is not intended to diagnose infection nor to guide or monitor treatment.   Culture, blood (routine x 2)     Status: None   Collection Time: 02/02/15  3:53 PM  Result Value Ref Range Status   Specimen Description BLOOD PICC LINE  Final   Special Requests BOTTLES DRAWN AEROBIC AND ANAEROBIC  Final   Culture   Final    NO GROWTH 5 DAYS Performed at [facility]    Report Status 02/08/2015 FINAL  Final  Culture, blood (routine x 2)     Status: None   Collection Time: 02/02/15  4:32 PM  Result Value Ref Range Status   Specimen Description BLOOD PICC LINE  Final   Special Requests BOTTLES DRAWN AEROBIC AND ANAEROBIC 10CC  Final   Culture   Final    NO GROWTH 5 DAYS Performed at [facility]    Report Status 02/08/2015 FINAL  Final  MRSA PCR Screening     Status: None   Collection Time: 02/02/15  5:14 PM  Result Value Ref Range Status   MRSA by PCR NEGATIVE NEGATIVE Final    Comment:        The GeneXpert MRSA Assay (FDA approved for NASAL specimens only), is one component of a comprehensive MRSA colonization surveillance program. It is not intended to diagnose MRSA infection nor to guide or monitor treatment for MRSA infections.   Body fluid culture     Status: None   Collection Time: 02/04/15  5:45 PM  Result Value Ref Range Status   Specimen Description SYNOVIAL FLUID SHOULDER RIGHT  Final     Special Requests NONE  Final   Gram Stain   Final    FEW WBC PRESENT, PREDOMINANTLY PMN NO ORGANISMS SEEN Performed at [facility]    Culture   Final    NO GROWTH 3 DAYS Performed at [facility]    Report Status 02/08/2015 FINAL  Final  Surgical pcr screen     Status: None   Collection Time: 02/05/15  5:06 AM  Result Value Ref Range Status   MRSA, PCR NEGATIVE NEGATIVE Final   Staphylococcus aureus NEGATIVE NEGATIVE Final    Comment:        The Xpert SA Assay (FDA approved for NASAL specimens in patients over 73 years of age), is one component of a comprehensive surveillance program.  Test performance has been validated by [facility] for patients greater than or equal to 62 year old. It is not intended to diagnose infection nor to guide or monitor treatment.     Studies/Results: No results found.    Assessment/Plan:  Principal Problem:   Acute diastolic CHF (congestive heart failure), NYHA class 3 Active Problems:   Diabetes type 2, uncontrolled   Uncontrolled hypertension   Acute renal failure   Severe mitral regurgitation   Endocarditis   Anasarca   Arthritis, septic, shoulder   Chronic renal insufficiency, stage III (moderate)   Acute renal failure superimposed on stage 3 chronic kidney disease   Bacterial endocarditis   Edema   HTN (hypertension)   Acute on chronic diastolic heart failure   Facial numbness   Vertebral osteomyelitis   Septic arthritis of shoulder, right    Dwayne [name] is a 35 y.o. 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, PERFORATION, SEVERE MR,  PERICARDITIS with SEPTIC EMBOLI TO CNS,  RIGHT SEPTIC SHOULDER,, THORACIC SPINE DISCITIS, VERTEBRAL OSTEOMYELITIS currently on high-dose ampicillin and readmitted for apparent heart failure now sp I and D RIGHT SHOULDER UNFORTUNATELY FOUND TO HAVE NEW SEPTIC EMBOLI IN THE BRAIN  #1 Penicillin Sensitive Staphylococcus aureus  bacteremia mitral valve endocarditis with perforation severe mitral regurgitation, septic emboli to the brain right septic shoulder and thoracic spine discitis sp I and D shoulder, now with MRI showing new septic emboli to brain on effective antibiotics therapy  --PRESENCE OF NEW EMBOLI IN THE BRAIN WHILE ON PROPER ANTIBIOTICS IS AN INDICATION FOR MORE URGENT VALVE REPLACEMENT  --I WOULD FAVOR VALVE REPLACEMENT AS SOON AS POSSIBLE AS I HAVE ANXIETY ABOUT PT HAVING A POTENTIALLY DISABLING EMBOLISM TO HIS CNS  For now this has been put on hold pending renal evaluation. Again MY PREFERENCE would be for surgery as soon as possible.   I think we are going to have to simply go forward with high risk of renal failure, there is also risk of further risk of renal insult if he continues to embolize and embolizes the kidney a possibel explanation of his original AKI   --CONTINUE  high dose AMPICILLIN   Greatly appreciate Dr. Lajoyce Corners performing I and D of shoulder which should help [place] control this infection. Again I worry that he needs valve surgery more urgently now, I would consider asking Dr. Lajoyce Corners to come and reexamine patient versus repeat imaging the shoulder again if pain persists over the next week.    #2 Septic arthritis with osteomyelitis: sp I and D by Dr. Lajoyce Corners Will consider re-imaging shoulder down the road to reassess bone, and perhaps do this sooner if pain still not improving much  #3 Vertebral osteomyelitis, diskitis: he has NO new pain in the back, in fact no pain at all  I would consider repeat MRI 2 months after the last one to ensure this is progressing at appropriate pace (keeping in mind that imaging can lag behind clinical progress here)  Dr Orvan Falconer is taking over the service tomorrow.  LOS: 12 days   Acey LavCornelius Van Dam 02/14/2015, 1:53 PM

## 2015-02-14 NOTE — Progress Notes (Signed)
Subjective: Shoulder hurts.    Objective: Vital signs in last 24 hours: Temp:  [97.7 F (36.5 C)-98.6 F (37 C)] 98 F (36.7 C) (02/15 0759) Pulse Rate:  [67-79] 76 (02/15 0759) Resp:  [18] 18 (02/15 0759) BP: (132-143)/(77-86) 143/81 mmHg (02/15 0759) SpO2:  [95 %-100 %] 96 % (02/15 0759) Weight:  [265 lb 9.6 oz (120.475 kg)-269 lb 6.4 oz (122.199 kg)] 269 lb 6.4 oz (122.199 kg) (02/15 0400) Last BM Date: 02/13/15  Intake/Output from previous day: 02/14 0701 - 02/15 0700 In: 2130 [P.O.:1200; I.V.:830; IV Piggyback:100] Out: 1950 [Urine:1950] Intake/Output this shift: Total I/O In: -  Out: 400 [Urine:400]  Medications . amLODipine  10 mg Oral Daily  . ampicillin (OMNIPEN) IV  2 g Intravenous 4 times per day  . carvedilol  50 mg Oral BID WC  . cloNIDine  0.2 mg Oral BID  . docusate sodium  100 mg Oral BID  . feeding supplement (ENSURE COMPLETE)  237 mL Oral TID WC  . hydrALAZINE  100 mg Oral 3 times per day  . Influenza vac split quadrivalent PF  0.5 mL Intramuscular Tomorrow-1000  . insulin aspart  0-9 Units Subcutaneous TID WC  . insulin aspart  4 Units Subcutaneous TID WC  . insulin detemir  10 Units Subcutaneous QHS  . isosorbide mononitrate  60 mg Oral BID  . polyethylene glycol  17 g Oral BID  . sodium chloride  3 mL Intravenous Q12H   PE: General appearance: alert, cooperative, no distress and Sitting up in the chair Lungs: clear to auscultation bilaterally Heart: regular rate and rhythm and 1/6 sys MM in the axillae Extremities: 2+ LEE Pulses: 2+ and symmetric Skin: warm and dry Neurologic: Grossly normal  Lab Results:   Recent Labs  02/13/15 0445 02/14/15 0735  WBC 4.5 4.3  HGB 8.5* 8.4*  HCT 25.8* 25.5*  PLT 288 283   BMET  Recent Labs  02/12/15 0500 02/13/15 0445 02/14/15 0735  NA 136 136 140  K 4.6 4.4 3.8  CL 96 98 101  CO2 33* 33* 32  GLUCOSE 187* 120* 141*  BUN 43* 48* 45*  CREATININE 3.26* 3.93* 3.55*  CALCIUM 8.4 8.3*  8.3*    Assessment/Plan  1. Acute on chronic systolic heart failure due to severe mitral insufficiency- Net fluids:  +15480ml/-18.8L.  Fluids are essentially even the last five days.  Lasix held due to worsening Scr.  Continue strict I/O.  He still has significant LEE.  2. Severe mitral insufficiency  Due to endocarditis related valve perforation - the plan is to perform surgery possibly on Wednesday 3. Staphylococcal endocarditis,  Complicated by CNS embolism and secondary foci of infection in his shoulder and spine. Secondary foci of infection have been addressed. Has had 32 days of intravenous antibiotics. Last blood cultures were sterile. Due to his young age he will need a mechanical mitral valve if the valve cannot be repaired surgically.  4. Severe hypertension -  avoiding RAAS inhibitors due to volatile renal function. Amlodipine 10, clonidine 0.2BID, coreg50 bid, hydralazine 100Q8, imdur 60 BID. 5. Acute on chronic renal insufficiency -  jump in Cr yesterday.  Lasix held plan per primary service.  SCr improved today to 3.55.  6. Anemia -  multifactorial, renal insufficiency, endocarditis, etc. Stable, but may need some blood before surgery 7. Septic arthritis of shoulder, right  POD #9: Extensive debridement of the glenohumeral joint. Subacromial decompression   LOS: 12 days   HAGER, BRYAN PA-C  02/14/2015 8:56 AM  The patient was seen, examined and discussed with Wilburt Finlay, PA-C and I agree with the above.   35 year old male awaiting MV repair/possible replacement for Staph au endocarditis, with acute on chronic systolic CHF, acute on chronic kidney failure and hypertension.  I would continue holding Lasix, encourage to use compression stockings as much as he could and wait with surgery until Crea improved.  Meanwhile I would increase imdur dose as he is still hypertensive.  Lars Masson 02/14/2015

## 2015-02-15 ENCOUNTER — Encounter (HOSPITAL_COMMUNITY)
Admission: EM | Disposition: A | Discharge status: Home Health | Payer: Self-pay | Source: Home / Self Care | Attending: Internal Medicine

## 2015-02-15 DIAGNOSIS — I38 Endocarditis, valve unspecified: Secondary | ICD-10-CM | POA: Insufficient documentation

## 2015-02-15 DIAGNOSIS — B958 Unspecified staphylococcus as the cause of diseases classified elsewhere: Secondary | ICD-10-CM

## 2015-02-15 LAB — RENAL FUNCTION PANEL
Albumin: 1.9 g/dL — ABNORMAL LOW (ref 3.5–5.2)
Anion gap: 12 (ref 5–15)
BUN: 42 mg/dL — ABNORMAL HIGH (ref 6–23)
CALCIUM: 8.6 mg/dL (ref 8.4–10.5)
CO2: 24 mmol/L (ref 19–32)
CREATININE: 3.15 mg/dL — AB (ref 0.50–1.35)
Chloride: 102 mmol/L (ref 96–112)
GFR calc non Af Amer: 24 mL/min — ABNORMAL LOW (ref >90)
GFR, EST AFRICAN AMERICAN: 28 mL/min — AB (ref >90)
GLUCOSE: 229 mg/dL — AB (ref 70–99)
PHOSPHORUS: 6 mg/dL — AB (ref 2.3–4.6)
Potassium: 3.9 mmol/L (ref 3.5–5.1)
Sodium: 138 mmol/L (ref 135–145)

## 2015-02-15 LAB — GLUCOSE, CAPILLARY
GLUCOSE-CAPILLARY: 204 mg/dL — AB (ref 70–99)
Glucose-Capillary: 161 mg/dL — ABNORMAL HIGH (ref 70–99)
Glucose-Capillary: 346 mg/dL — ABNORMAL HIGH (ref 70–99)
Glucose-Capillary: 207 mg/dL — ABNORMAL HIGH (ref 70–99)

## 2015-02-15 LAB — MAGNESIUM: Magnesium: 2.4 mg/dL (ref 1.5–2.5)

## 2015-02-15 SURGERY — REPAIR, MITRAL VALVE, MINIMALLY INVASIVE
Anesthesia: General | Site: Chest | Laterality: Right

## 2015-02-15 NOTE — Progress Notes (Signed)
Subjective:  He denies any SOB at rest.   Objective:  Vital Signs in the last 24 hours: Temp:  [98 F (36.7 C)-99.2 F (37.3 C)] 99.2 F (37.3 C) (02/16 0745) Pulse Rate:  [71-80] 80 (02/16 0745) Resp:  [18-20] 18 (02/16 0745) BP: (132-152)/(71-87) 149/71 mmHg (02/16 0745) SpO2:  [96 %-100 %] 99 % (02/16 0745) Weight:  [252 lb 13.9 oz (114.7 kg)] 252 lb 13.9 oz (114.7 kg) (02/16 0500)  Intake/Output from previous day:  Intake/Output Summary (Last 24 hours) at 02/15/15 0853 Last data filed at 02/15/15 0830  Gross per 24 hour  Intake 890.17 ml  Output   4475 ml  Net -3584.83 ml    Physical Exam: General appearance: alert, cooperative and no distress Lungs: clear to auscultation bilaterally Heart: regular rate and rhythm and 2/6 systolic murmur   Rate: 78  Rhythm: normal sinus rhythm  Lab Results:  Recent Labs  02/13/15 0445 02/14/15 0735  WBC 4.5 4.3  HGB 8.5* 8.4*  PLT 288 283    Recent Labs  02/14/15 0735 02/15/15 0330  NA 140 138  K 3.8 3.9  CL 101 102  CO2 32 24  GLUCOSE 141* 229*  BUN 45* 42*  CREATININE 3.55* 3.15*   No results for input(s): TROPONINI in the last 72 hours.  Invalid input(s): CK, MB No results for input(s): INR in the last 72 hours.  Imaging: Imaging results have been reviewed  Cardiac Studies:  Assessment/Plan:  35 year old male discharged on 01/27/2015 after admission for mitral valve endocarditis with MSSA. This was complicated by septic emboli to the brain and possible emboli of the kidney and lower extremity as well. 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 He had normal LV function and moderate to severe LVH. A left heart cath on 01/24/2015 showed normal coronarys and moderate pulmonary hypertension with elevated LV filling pressures with large V wave consistent with  severe MR. Patient underwent dental extraction on 01/25/2015. He was evaluated by cardiothoracic surgery who favored 6 week course of antibiotics before proceeding with mitral valve replacement. That  hospitalization was also complicated by acute diastolic congestive heart failure, acute renal insufficiency, pericarditis with small fibrous pericardial effusion, diabetes mellitus, anemia (2 units PRBCs), hypokalemia, osteomyelitis of T3 and T4 myositis in the area of L4 and L5, and right shoulder with septic arthritis/synovitis. He was re admitted 02/02/15 with CHF. He underwent should debridement 01/615. He has CHF with acute on chronic renal insufficiency with diuresis. This unfortunately has delayed his MV surgery.   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 SCr a little better today.   Corine Shelter PA-C Beeper [phone] 02/15/2015, 8:53 AM   The patient was seen, examined and discussed with Corine Shelter, PA-C and I agree with the above.   35 year old male awaiting MV repair/possible replacement for Staph au endocarditis, with acute on chronic systolic CHF, acute on chronic kidney failure and hypertension.  I would continue holding  Lasix, encourage to use compression stockings and elevate his legs as much as possible.  He continues to have very good diuresis without Lasix and his Crea is improving, today 3.15. BO is better controlled. I would continue the current BO regimen.  Surgery postponed to later this week.   Lars MassonELSON, Arihanna Estabrook H 02/15/2015

## 2015-02-15 NOTE — Progress Notes (Signed)
      [address]       [place] [id number]             [phone]     CARDIOTHORACIC SURGERY PROGRESS NOTE  10 Days Post-Op  S/P Procedure(s) (LRB): ARTHROSCOPY SHOULDER (Right)  Subjective: No new complaints.  Sore shoulder  Objective: Vital signs in last 24 hours: Temp:  [98 F (36.7 C)-99.2 F (37.3 C)] 99.2 F (37.3 C) (02/16 0745) Pulse Rate:  [71-80] 80 (02/16 0745) Cardiac Rhythm:  [-] Normal sinus rhythm (02/15 2100) Resp:  [18-20] 18 (02/16 0745) BP: (132-152)/(71-87) 149/71 mmHg (02/16 0745) SpO2:  [96 %-100 %] 99 % (02/16 0745) Weight:  [114.7 kg (252 lb 13.9 oz)] 114.7 kg (252 lb 13.9 oz) (02/16 0500)  Physical Exam:  Rhythm:   sinus  Breath sounds: clear  Heart sounds:  RRR w/ systolic murmur  Incisions:  n/a  Abdomen:  Soft, non-distended, non-tender  Extremities:  Warm, well-perfused, 2+ edema    Intake/Output from previous day: 02/15 0701 - 02/16 0700 In: 890.2 [P.O.:720; I.V.:120.2; IV Piggyback:50] Out: 4575 [Urine:4575] Intake/Output this shift: Total I/O In: -  Out: 300 [Urine:300]  Lab Results:  Recent Labs  02/13/15 0445 02/14/15 0735  WBC 4.5 4.3  HGB 8.5* 8.4*  HCT 25.8* 25.5*  PLT 288 283   BMET:  Recent Labs  02/14/15 0735 02/15/15 0330  NA 140 138  K 3.8 3.9  CL 101 102  CO2 32 24  GLUCOSE 141* 229*  BUN 45* 42*  CREATININE 3.55* 3.15*  CALCIUM 8.3* 8.6    CBG (last 3)   Recent Labs  02/14/15 1654 02/14/15 2128 02/15/15 0738  GLUCAP 161* 125* 161*   PT/INR:  No results for input(s): LABPROT, INR in the last 72 hours.  CXR:  N/A  Assessment/Plan: S/P Procedure(s) (LRB): ARTHROSCOPY SHOULDER (Right)  Clinically stable. Creatinine trending down but still >3 Will postpone mitral valve repair/replacement at least a few more days - possibly reschedule for this coming Friday or early next week. Discussed w/ Dr Kathrene BongoGoldsborough, patient and his wife  I spent in excess of 15 minutes during  the conduct of this hospital encounter and >50% of this time involved direct face-to-face encounter with the patient for counseling and/or coordination of their care.   OWEN,CLARENCE H 02/15/2015 8:27 AM

## 2015-02-15 NOTE — Progress Notes (Signed)
Patient ID: Dwayne [name], male   DOB: Aug 29, 1980, 35 y.o.   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 amLODipine  5 mg Oral Daily  . ampicillin (OMNIPEN) IV  2 g Intravenous 4 times per day  . carvedilol  50 mg Oral BID WC  . cloNIDine  0.2 mg Oral BID  . darbepoetin (ARANESP) injection - NON-DIALYSIS  60 mcg Subcutaneous Q Tue-1800  . docusate sodium  100 mg Oral BID  . feeding supplement (ENSURE COMPLETE)  237 mL Oral TID WC  . furosemide  40 mg Intravenous Q12H  . hydrALAZINE  50 mg Oral 3 times per day  . Influenza vac split quadrivalent PF  0.5 mL Intramuscular Tomorrow-1000  . insulin aspart  0-9 Units Subcutaneous TID WC  . insulin aspart  4 Units Subcutaneous TID WC  . insulin detemir  10 Units Subcutaneous QHS  . isosorbide mononitrate  90 mg Oral BID  . polyethylene glycol  17 g Oral BID  . sodium chloride  3 mL Intravenous Q12H    Subjective: He states that he is feeling a little bit worse since his admission a little over one month ago. He says he had "more teeth when he was admitted" and he says he is having more right shoulder pain.  Review of Systems: Pertinent items are noted in HPI.  Past Medical History  Diagnosis Date  . Hypertension   . Acute renal failure   . Bacteremia     MSSA   . Diabetes type 2, uncontrolled 01/09/2015  . 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 Essential hypertension, benign  01/18/2014  . Infective myositis of left thigh   . Noncompliance 01/18/2014  . Septic shock   . Staphylococcus aureus bacteremia with sepsis   . Tobacco abuse 01/15/2014  . Mitral regurgitation 01/13/2015    Severe by TEE  . MSSA (methicillin susceptible Staphylococcus aureus)   . Type II diabetes mellitus     not controlled  . Diabetic foot infection 01/15/2014    History  Substance Use Topics  . Smoking status: Former Smoker -- 12 years    Types: Cigars    Quit date: 01/09/2015  . Smokeless tobacco: Never Used  . Alcohol Use: Yes     Comment: 02/02/2015 "I'll have 2-3 mixed drinks 2-3 times/yr"    History reviewed. No pertinent family history. No Known Allergies  OBJECTIVE: Blood pressure 127/69, pulse 81, temperature 98.2 F (36.8 C), temperature source Oral, resp. rate 18, height  (1.88 m), weight 252 lb 13.9 oz (114.7 kg), SpO2 100 %. General: he looks weak and slightly worn out but otherwise in no distress sitting on the side of his bed Skin: no rash Lungs: clear Cor: regular S1 and S2 with a one over two or 6 systolic murmur Abdomen: soft and nontender  Extremities: Right shoulder sutures in place. Mild tenderness to palpation. Pitting edema of both legs to the level of the knee  Lab Results Lab Results  Component Value Date   WBC 4.3 02/14/2015   HGB 8.4* 02/14/2015   HCT 25.5* 02/14/2015   MCV 85.0 02/14/2015   PLT 283 02/14/2015    Lab Results  Component Value Date   CREATININE 3.15* 02/15/2015   BUN 42* 02/15/2015   NA 138 02/15/2015   K 3.9 02/15/2015   CL 102 02/15/2015   CO2 24 02/15/2015    Lab Results  Component Value Date   ALT 20 02/02/2015   AST 18 02/02/2015   ALKPHOS 112 02/02/2015   BILITOT 0.3 02/02/2015     Microbiology: No results found for this or any previous visit (from the past 240 hour(s)).  Assessment: [name] has severe MSSA infection with bacteremia in early January complicated by mitral valve endocarditis, mitral  valve regurgitation, septic right shoulder, septic CNS emboli, and probable early thoracic vertebral infection. He has cleared his bacteremia.he has acute on chronic renal insufficiency which is slightly improved recently.  Plan: 1. Continue ampicillin  Dwayne AstersJohn Nhan Qualley, MD [facility] [phone] pager   [phone] cell 02/15/2015, 4:34 PM

## 2015-02-15 NOTE — Progress Notes (Signed)
Subjective:  Tired this AM- great UOP on minimal lasix- BP still good with modifications of BP meds, creatinine trending down nicely Objective Vital signs in last 24 hours: Filed Vitals:   02/14/15 2023 02/15/15 0030 02/15/15 0500 02/15/15 0745  BP: 132/74 141/80 152/87 149/71  Pulse: 71 73 74 80  Temp: 98.5 F (36.9 C) 98.2 F (36.8 C) 98.4 F (36.9 C) 99.2 F (37.3 C)  TempSrc: Oral Oral Oral Oral  Resp: Height:      Weight:   114.7 kg (252 lb 13.9 oz)   SpO2: 96% 100% 99% 99%   Weight change: -5.775 kg (-12 lb 11.7 oz)  Intake/Output Summary (Last 24 hours) at 02/15/15 0757 Last data filed at 02/15/15 0700  Gross per 24 hour  Intake 890.17 ml  Output   4575 ml  Net -3684.83 ml    Assessment/Plan: 35 year old black male with history of poorly controlled hypertension and diabetes - diagnosed last month with MSSA bacteremia and 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 He also has some CKD with baseline creatinine in the high ones up to 2 with recent acute on chronic renal failure brought on by diuresis and likely decreased renal profusion.  1.Renal- I suspect that he likely has some baseline renal insufficiency and CKD on the basis of his DM and HTN. With common things being common, I suspect that his most recent acute on chronic renal failure is a result of relative hypotension and decreased renal perfusion. It is true that this was in the setting of diuresis but I don't believe that he was over diuresed per se. Fortunately, his renal function appears to be heading in the right direction at this time. U/A has not been resulted.  I do not detect any obvious nephrotoxins. I believe that he does need some diuresis  And that has been successful.  I would like to see his creatinine closer to 2 before he's actually taken to the OR to lessen his risk of postoperative acute on chronic renal failure. I think that Friday is a good day to  aim for and have discussed this with Dr. Cornelius Moras.  We will never be able to take his kidney risk to zero and patient understands that.  We just have to hope that they will continue to be resilient 2. Hypertension/volume - he has massive edema. Not sure how much this has to do with his mitral regurg, he has hypoalbuminemia which is going to make him more prone to edema and is going to be difficult fix at this juncture.  he is on a supplement.  So far, diuresing and blood pressure is reasonable, no changes to meds today.  3. Anemia - is not helping his volume status either, have added ESA to help boost   Fedora Knisely A    Labs: Basic Metabolic Panel:  Recent Labs Lab 02/13/15 0445 02/14/15 0735 02/15/15 0330  NA 136 140 138  K 4.4 3.8 3.9  CL 98 101 102  CO2 33* 32 24  GLUCOSE 120* 141* 229*  BUN 48* 45* 42*  CREATININE 3.93* 3.55* 3.15*  CALCIUM 8.3* 8.3* 8.6  PHOS  --   --  6.0*   Liver Function Tests:  Recent Labs Lab 02/15/15 0330  ALBUMIN 1.9*   No results for input(s): LIPASE, AMYLASE in the last 168 hours. No results for input(s): AMMONIA in the last 168 hours. CBC:  Recent Labs Lab 02/10/15 0520 02/10/15  1945 02/11/15 0408 02/13/15 0445 02/14/15 0735  WBC 5.0 5.4 4.9 4.5 4.3  NEUTROABS 3.3  --   --   --   --   HGB 7.8* 8.7* 8.6* 8.5* 8.4*  HCT 23.9* 26.7* 26.1* 25.8* 25.5*  MCV 87.5 88.1 85.9 86.9 85.0  PLT 287 294 275 288 283   Cardiac Enzymes: No results for input(s): CKTOTAL, CKMB, CKMBINDEX, TROPONINI in the last 168 hours. CBG:  Recent Labs Lab 02/14/15 0757 02/14/15 1156 02/14/15 1654 02/14/15 2128 02/15/15 0738  GLUCAP 133* 172* 161* 125* 161*    Iron Studies: No results for input(s): IRON, TIBC, TRANSFERRIN, FERRITIN in the last 72 hours. Studies/Results: No results found. Medications: Infusions: . sodium chloride 10 mL/hr at 02/14/15 1402    Scheduled Medications: . amLODipine  5 mg Oral Daily  . ampicillin (OMNIPEN) IV   2 g Intravenous 4 times per day  . carvedilol  50 mg Oral BID WC  . cloNIDine  0.2 mg Oral BID  . darbepoetin (ARANESP) injection - NON-DIALYSIS  60 mcg Subcutaneous Q Tue-1800  . docusate sodium  100 mg Oral BID  . feeding supplement (ENSURE COMPLETE)  237 mL Oral TID WC  . furosemide  40 mg Intravenous Q12H  . hydrALAZINE  50 mg Oral 3 times per day  . Influenza vac split quadrivalent PF  0.5 mL Intramuscular Tomorrow-1000  . insulin aspart  0-9 Units Subcutaneous TID WC  . insulin aspart  4 Units Subcutaneous TID WC  . insulin detemir  10 Units Subcutaneous QHS  . isosorbide mononitrate  90 mg Oral BID  . polyethylene glycol  17 g Oral BID  . sodium chloride  3 mL Intravenous Q12H    have reviewed scheduled and prn medications.  Physical Exam: General: sitting up on side of bed- very flat affect Heart: RRR Lungs: mostly clear Abdomen: soft, non tender Extremities: pitting edema    02/15/2015,7:57 AM  LOS: 13 days

## 2015-02-15 NOTE — Progress Notes (Signed)
Patient ID: Dwayne HickmanBenjamin Moncrieffe, male   DOB: 07/11/1980, 35 y.o.   MRN: [id number] Patient still has some pain and limitations with range of motion of his right shoulder. I will have nursing remove the sutures.

## 2015-02-15 NOTE — Progress Notes (Signed)
CARDIAC REHAB PHASE I   PRE:  Rate/Rhythm: 88 SR    BP: sitting 138/93    SaO2:   MODE:  Ambulation: 550 ft   POST:  Rate/Rhythm: 89 SR    BP: sitting 149/95     SaO2: 100 RA   Pt agreeable to walk. Fairly steady, some shuffling of his feet. Slow pace. Sts that it is nice that his legs aren't rubbing together anymore. Tired at end of walk and he stated that this was the farthest he had walked in a while. Return to EOB. Encouraged pt to keep his feet elevated. Pt denied questions about surgery and sts he doesn't want to talk about it anymore. Encouraged pt to walk several times a day. Will follow as time allows. [id number]   Elissa LovettReeve, Aricka Goldberger [place] CES, ACSM 02/15/2015 1:39 PM

## 2015-02-15 NOTE — Progress Notes (Signed)
Pt sutures removed from right shoulder. No drainage or blood noted. Will continue to monitor.

## 2015-02-15 NOTE — Progress Notes (Signed)
Occupational Therapy Treatment Patient Details Name: Rob HickmanBenjamin Conely MRN: [id number] DOB: 11/16/1980 Today's Date: 02/15/2015    History of present illness Rob HickmanBenjamin Friedel  is a 35 y.o. 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 He was planned to undergo open heart surgery for valve replacement in 4-6 weeks. admitted with chest pain, anasarca, scrotal edema, and R shoulder septic arthritis; Underwent R shoulder I& D and subacromial decompression on 2/6   OT comments  Pt reluctantly agreeable to OT for R UE there ex today given increased vc's for participation. Pt required vc's for follow through and did not speak throughout session. Pt was resting in bed at conclusion of session w/ family member at his bedside.  Follow Up Recommendations  Home health OT;Supervision/Assistance - 24 hour    Equipment Recommendations  Tub/shower seat    Recommendations for Other Services      Precautions / Restrictions Precautions Precautions: Fall Restrictions Weight Bearing Restrictions: No       Mobility Bed Mobility Overal bed mobility: Independent                Transfers Overall transfer level:  (Pt declined all OOB ex's and ADL's)                    Balance                                   ADL                                         General ADL Comments: Pt was sitting at EOB eating breakfast this morning upon first OT attempt, pt was educated that OT would be back ASAP & verbalized agreement. When OT returned, pt had bed linens over his head and was resting, he declined ADL by grunting and shaking  head no but never spoke during treatment session. Pt was reluctanlt to perform UE ther ex but after moderate coaxing, he participated, again not verbalizing throughout session. Pt performed ther ex for RUE to include digits, elbow, wrist, forearm and shoulder. He declined any OOB for pendulum ex's however.      Vision  Wears glasses at all times.                    Perception     Praxis      Cognition   Behavior During Therapy: Flat affect (Irritated wanted to sleep) Overall Cognitive Status: Within Functional Limits for tasks assessed                       Extremity/Trunk Assessment               Exercises Shoulder Exercises Pendulum Exercise:  (Pt declined OOB activity today.) Shoulder Flexion: AAROM;Right;10 reps;Supine Shoulder ABduction: AAROM;Right;10 reps;Supine Shoulder External Rotation: AAROM;Right;10 reps;Supine (using LH shoehorn in pt room) Elbow Flexion: AROM;Right;10 reps;Supine Elbow Extension: AROM;Right;10 reps;Supine Wrist Flexion: AROM;Right;10 reps Wrist Extension: AROM;Right;10 reps Digit Composite Flexion: AROM;Right;10 reps  Composite Extension: AROM;Right;10 reps Hand Exercises Forearm Supination: AROM;Right;10 reps;Supine Forearm Pronation: AROM;Right;10 reps;Supine   Shoulder Instructions       General Comments      Pertinent Vitals/ Pain       Pain Assessment: 0-10 Pain Score: 4  Pain Location: R shoulder  Pain Descriptors / Indicators: Sore;Constant Pain Intervention(s): Limited activity within patient's tolerance;Monitored during session  Home Living                                          Prior Functioning/Environment              Frequency Min 3X/week     Progress Toward Goals  OT Goals(current goals can now be found in the care plan section)  Progress towards OT goals: Progressing toward goals     Plan Discharge plan remains appropriate    Co-evaluation                  End of Session     Activity Tolerance Patient tolerated treatment well   Patient Left in bed;with call bell/phone within reach;with family/visitor present   Nurse Communication          Time: 4098-1191 OT Time Calculation (min): 18 min  Charges: OT General Charges $OT Visit: 1 Procedure OT Treatments $Therapeutic Exercise: 8-22 mins (18 min)  [place], Amy Beth Dixon, OTR/L 02/15/2015, 9:27 AM

## 2015-02-15 NOTE — Progress Notes (Addendum)
PROGRESS NOTE  Rob HickmanBenjamin Sanzo ZOX:[id number] DOB: 03/24/1980 DOA: 02/02/2015 PCP: No PCP Per Patient  HPI/Recap of past 24 hours: s/p right shoulder washout (2/6). Still Complaining of pain right shoulder with movement. Denies CP, SOB and fever, BP much better. Edema has much improved Patient has experienced acute on chronic renal failure with Cr up to 3.5 now. 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 Acute on chronic diastolic dysfunction EF 60%. 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 Also with diet and meds non-compliance. -Cr jump to 3.9 on 2/14; now trending down; will hold lasix again today (Cr 2/15 3.5) -Will continue coreg/imdur/hydralazine -Patient encouraged to follow low sodium diet -Will continue daily weights and strict I's and O's -weight is slightly elevated at 269 pounds; but scrotal edema is resolved, no crackles on exam, no SOB or orthopnea and no JVD. less edema bilaterally on 2/16, on lasix 40mg  iv bid as of 2/16 -continue TED hose  2. Endocarditis: Continue IV ampicillin dosed by pharmacy for now. Infectious disease consulted; will follow rec's. Has left arm PICC line.  -Continue patient tune up process for potential surgery later this week, if cr improves  3. Right shoulder septic arthritis.  -Mri shoulder on 2/5 concerning for septic arthritis/osteomyelitis; Dr. Lajoyce Cornersuda consulted. -2/6 patient was brought to the OR and had: Right shoulder ARTHROSCOPY SHOULDER Extensive debridement of the glenohumeral  joint. Subacromial decompression.  -Orthopedic service to help with documentation of depth, type and instrumentation used.  4. Septic embolic infarcts: due to c/o facial numbness on 2/7, mri brain repeated, results showed: While several of these foci appear to be of slightly different ages, these are new relative to prior MRI from 01/14/2015, where the previously seen infarcts have resolved/normalized. No significant mass effect or parenchymal hemorrhage.  -continue IV antibiotics and hopefully surgery to fix his MV soon (potentially 2/17)  5. Dental caries. Multiple teeth removed one week ago prior to this admission. -Still requiring prn pain meds and unable to chew -will continue dysphagia 3 diet  6. DM type II. Last A1c was above 8.  -will adjust as needed -continue SSI, novolog meal coverage and Levemir  7. Essential hypertension.  -Remains Uncontrolled, but a lot better -following cardiology rec's will start clonidine 0.2 mg twice a day -Will continue hydralazine, norvasc, coreg and Imdur (dose adjusted per cardiology rec's for even better BP control) -continue low sodium diet encourage   8.Acute on Chronic kidney disease stage IV. Baseline creatinine around 2-2.6.  -Cr at 3.9 on (2/14) and now down to 3.5; appears to be secondary to diuresis and BP controlled (as that will be equivalent of decrease perfusion in organs use to high BP) -diuretics dose adjusted -renal service consulted  9. Anemia of chronic disease: Hemoglobin was slightly drifting down most likely due to multiple consecutive phlebotomies. No signs of active bleeding. -In anticipation for surgery received 1 unit of PRBCs on 2/11 -Hgb remains stable at 8.4 -will follow trend and if needed transfuse another unit of PRBC; plan is to keep Hgb as close to 9 as  possible.   Code Status: full  Family Communication: patient bedside  Disposition Plan: remain inpatient; anticipated surgery next week (per Dr. Cornelius Moras  2/17)   Consultants:  Cardiology  Cardiothoracic surgery  Infectious disease  Orthopedic surgery  Renal service  Procedures: 2/6 Right shoulder joint ARTHROSCOPY SHOULDER  Extensive debridement of the glenohumeral joint. Subacromial decompression.  Antibiotics:  ampicillin   Objective: BP 132/65 mmHg  Pulse 84  Temp(Src) 99.2 F (37.3 C) (Oral)  Resp 18  Ht  (1.88 m)  Wt 114.7 kg (252 lb 13.9 oz)  BMI 32.45 kg/m2  SpO2 96%  Intake/Output Summary (Last 24 hours) at 02/15/15 1521 Last data filed at 02/15/15 1252  Gross per 24 hour  Intake 940.17 ml  Output   4025 ml  Net -3084.83 ml   Filed Weights   02/13/15 1027 02/14/15 0400 02/15/15 0500  Weight: 120.475 kg (265 lb 9.6 oz) 122.199 kg (269 lb 6.4 oz) 114.7 kg (252 lb 13.9 oz)    Exam:   General:  AAOx3, NAD; denies CP. BP still elevated but significantly improved overall. Volume slightly up (265-269)  Cardiovascular: RRR, soft systolic murmur appreciated on exam, no gallops; no JVD  Respiratory: good air movement; no crackles; no wheezing and no rhonchi  Abdomen: soft/NT/ND, positive bowel sounds  Musculoskeletal: pitting edema bilateral (2++), right shoulder dressing intact/decrease range of motion and pain with movement.  Data Reviewed: Basic Metabolic Panel:  Recent Labs Lab 02/11/15 0408 02/12/15 0500 02/13/15 0445 02/14/15 0415 02/14/15 0735 02/15/15 0330  NA 136 136 136  --  140 138  K 4.0 4.6 4.4  --  3.8 3.9  CL 99 96 98  --  101 102  CO2 29 33* 33*  --  32 24  GLUCOSE 224* 187* 120*  --  141* 229*  BUN 34* 43* 48*  --  45* 42*  CREATININE 2.48* 3.26* 3.93*  --  3.55* 3.15*  CALCIUM 8.5 8.4 8.3*  --  8.3* 8.6  MG 2.4 2.4 2.5 2.5  --  2.4  PHOS  --   --   --   --   --  6.0*   CBC:  Recent Labs Lab 02/10/15 0520 02/10/15 1945 02/11/15 0408 02/13/15 0445 02/14/15 0735  WBC 5.0 5.4 4.9 4.5 4.3  NEUTROABS 3.3  --   --   --   --   HGB 7.8* 8.7* 8.6* 8.5* 8.4*  HCT  23.9* 26.7* 26.1* 25.8* 25.5*  MCV 87.5 88.1 85.9 86.9 85.0  PLT 287 294 275 288 283   BNP (last 3 results)  Recent Labs  01/22/15 0347 02/02/15 1035 02/11/15 0510  BNP 218.9* 629.0* 182.2*   CBG:  Recent Labs Lab 02/14/15 1156 02/14/15 1654 02/14/15 2128 02/15/15 0738 02/15/15 1135  GLUCAP 172* 161* 125* 161* 346*    No results found for this or any previous visit (from the past 240 hour(s)).   Studies: No results found.  Scheduled Meds: . amLODipine  5 mg Oral Daily  . ampicillin (OMNIPEN) IV  2 g Intravenous 4 times per day  . carvedilol  50 mg Oral BID WC  . cloNIDine  0.2 mg Oral BID  . darbepoetin (ARANESP) injection - NON-DIALYSIS  60 mcg Subcutaneous Q Tue-1800  . docusate sodium  100 mg Oral BID  . feeding supplement (ENSURE COMPLETE)  237 mL Oral TID WC  . furosemide  40 mg Intravenous Q12H  . hydrALAZINE  50 mg Oral 3 times per day  .  Influenza vac split quadrivalent PF  0.5 mL Intramuscular Tomorrow-1000  . insulin aspart  0-9 Units Subcutaneous TID WC  . insulin aspart  4 Units Subcutaneous TID WC  . insulin detemir  10 Units Subcutaneous QHS  . isosorbide mononitrate  90 mg Oral BID  . polyethylene glycol  17 g Oral BID  . sodium chloride  3 mL Intravenous Q12H    Continuous Infusions: . sodium chloride 10 mL/hr at 02/14/15 1402    Time: 30 minutes   Biff Rutigliano  [facility] Pager [phone]. If 7PM-7AM, please contact night-coverage at [email or web address], password [facility] 02/15/2015, 3:21 PM  LOS: 13 days

## 2015-02-16 LAB — URINALYSIS, ROUTINE W REFLEX MICROSCOPIC
Bilirubin Urine: NEGATIVE
Glucose, UA: NEGATIVE mg/dL
Hgb urine dipstick: NEGATIVE
KETONES UR: NEGATIVE mg/dL
LEUKOCYTES UA: NEGATIVE
NITRITE: NEGATIVE
PH: 7 (ref 5.0–8.0)
Protein, ur: 100 mg/dL — AB
SPECIFIC GRAVITY, URINE: 1.012 (ref 1.005–1.030)
Urobilinogen, UA: 0.2 mg/dL (ref 0.0–1.0)

## 2015-02-16 LAB — RENAL FUNCTION PANEL
Albumin: 1.9 g/dL — ABNORMAL LOW (ref 3.5–5.2)
Anion gap: 3 — ABNORMAL LOW (ref 5–15)
BUN: 38 mg/dL — AB (ref 6–23)
CHLORIDE: 104 mmol/L (ref 96–112)
CO2: 32 mmol/L (ref 19–32)
Calcium: 8.7 mg/dL (ref 8.4–10.5)
Creatinine, Ser: 2.6 mg/dL — ABNORMAL HIGH (ref 0.50–1.35)
GFR calc Af Amer: 35 mL/min — ABNORMAL LOW (ref >90)
GFR, EST NON AFRICAN AMERICAN: 30 mL/min — AB (ref >90)
GLUCOSE: 190 mg/dL — AB (ref 70–99)
Phosphorus: 5.5 mg/dL — ABNORMAL HIGH (ref 2.3–4.6)
Potassium: 3.7 mmol/L (ref 3.5–5.1)
Sodium: 139 mmol/L (ref 135–145)

## 2015-02-16 LAB — GLUCOSE, CAPILLARY
GLUCOSE-CAPILLARY: 232 mg/dL — AB (ref 70–99)
GLUCOSE-CAPILLARY: 215 mg/dL — AB (ref 70–99)
Glucose-Capillary: 125 mg/dL — ABNORMAL HIGH (ref 70–99)
Glucose-Capillary: 184 mg/dL — ABNORMAL HIGH (ref 70–99)

## 2015-02-16 LAB — URINE MICROSCOPIC-ADD ON

## 2015-02-16 LAB — MAGNESIUM: MAGNESIUM: 2.1 mg/dL (ref 1.5–2.5)

## 2015-02-16 MED ORDER — FUROSEMIDE 10 MG/ML IJ SOLN: 40.0000 mg | Freq: Two times a day (BID) | INTRAMUSCULAR | Status: DC

## 2015-02-16 MED ORDER — SODIUM CHLORIDE 0.9 % IV SOLN
2.0000 g | INTRAVENOUS | Status: DC
  Administered 2015-02-16 – 2015-02-25 (×51): 2 g via INTRAVENOUS
  Filled 2015-02-16 (×64): qty 2000

## 2015-02-16 MED ORDER — FUROSEMIDE 10 MG/ML IJ SOLN
40.0000 mg | Freq: Two times a day (BID) | INTRAMUSCULAR | Status: DC
  Administered 2015-02-16 – 2015-02-17 (×3): 40 mg via INTRAVENOUS
  Filled 2015-02-16 (×3): qty 4

## 2015-02-16 NOTE — Progress Notes (Signed)
PROGRESS NOTE  Dwayne [name] EXB:[id number] DOB: 1980-09-12 DOA: 02/02/2015 PCP: No PCP Per Patient  HPI/Recap of past 24 hours: s/p right shoulder washout (2/6). Still Complaining of pain right shoulder with movement. Denies CP, SOB and fever, BP much better. Edema has much improved Patient has experienced acute on chronic renal failure with Cr up to 3.5 now. 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 Acute on chronic diastolic dysfunction EF 60%. 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 Also with diet and meds non-compliance. -Cr jump to 3.9 on 2/14; now trending down; will hold lasix again today (Cr 2/15 3.5) -Will continue coreg/imdur/hydralazine -Patient encouraged to follow low sodium diet -Will continue daily weights and strict I's and O's -weight is slightly elevated at 269 pounds; but scrotal edema is resolved, no crackles on exam, no SOB or orthopnea and no JVD. less edema bilaterally on 2/16, on lasix  iv bid as of 2/16 -continue TED hose  2. Endocarditis: Continue IV ampicillin dosed by pharmacy for now. Infectious disease consulted; will follow rec's. Has left arm PICC line.  -Continue patient tune up process for potential surgery later this week, if cr improves  3. Right shoulder septic arthritis.  -Mri shoulder on 2/5 concerning for septic arthritis/osteomyelitis; Dr. Lajoyce Corners consulted. -2/6 patient was brought to the OR and had: Right shoulder ARTHROSCOPY SHOULDER Extensive debridement of  the glenohumeral joint. Subacromial decompression.  -Orthopedic service to help with documentation of depth, type and instrumentation used.  4. Septic embolic infarcts: due to c/o facial numbness on 2/7, mri brain repeated, results showed: While several of these foci appear to be of slightly different ages, these are new relative to prior MRI from 01/14/2015, where the previously seen infarcts have resolved/normalized. No significant mass effect or parenchymal hemorrhage.  -continue IV antibiotics and hopefully surgery to fix his MV soon  5. Dental caries. Multiple teeth removed one week ago prior to this admission. -Still requiring prn pain meds and unable to chew -will continue dysphagia 3 diet  6. DM type II. Last A1c was above 8.  -will adjust as needed -continue SSI, novolog meal coverage and Levemir  7. Essential hypertension.  -bp better controlled -Will continue clonidine, lasix, hydralazine, norvasc, coreg and Imdur -continue low sodium diet encourage   8.Acute on Chronic kidney disease stage IV. Baseline creatinine around 2-2.6.  -Cr at 3.9 on (2/14) and now down to 2.6 on 2/17; appears to be secondary to diuresis and BP controlled (as that will be equivalent of decrease perfusion in organs use to high BP) -diuretics dose adjusted -renal service consulted  9. Anemia of chronic disease: Hemoglobin was slightly drifting down most likely due to multiple consecutive phlebotomies. No signs of active bleeding. -In anticipation for surgery received 1 unit of PRBCs on 2/11 -Hgb remains stable at 8.4 -will follow trend and if needed transfuse another unit of PRBC; plan is to keep Hgb as close to 9 as possible.   Code Status: full  Family Communication: patient  And wife at bedside  Disposition Plan: remain  inpatient; anticipated surgery when cr close to 2 (Dr. Cornelius Moraswen )   Consultants:  Cardiology  Cardiothoracic surgery  Infectious disease  Orthopedic surgery  Renal  service  Procedures: 2/6 Right shoulder joint ARTHROSCOPY SHOULDER  Extensive debridement of the glenohumeral joint. Subacromial decompression.  Antibiotics:  ampicillin   Objective: BP 150/80 mmHg  Pulse 72  Temp(Src) 98.4 F (36.9 C) (Oral)  Resp 16  Ht 6\' 2"  (1.88 m)  Wt 114.7 kg (252 lb 13.9 oz)  BMI 32.45 kg/m2  SpO2 96%  Intake/Output Summary (Last 24 hours) at 02/16/15 1445 Last data filed at 02/16/15 1312  Gross per 24 hour  Intake   1090 ml  Output   4000 ml  Net  -2910 ml   Filed Weights   02/13/15 1027 02/14/15 0400 02/15/15 0500  Weight: 120.475 kg (265 lb 9.6 oz) 122.199 kg (269 lb 6.4 oz) 114.7 kg (252 lb 13.9 oz)    Exam:   General:  AAOx3, NAD; denies CP.   Cardiovascular: RRR, soft systolic murmur appreciated on exam, no gallops; no JVD  Respiratory: good air movement; no crackles; no wheezing and no rhonchi  Abdomen: soft/NT/ND, positive bowel sounds  Musculoskeletal: less pitting edema bilateral, right shoulder dressing intact/decrease range of motion and pain with movement.  Data Reviewed: Basic Metabolic Panel:  Recent Labs Lab 02/12/15 0500 02/13/15 0445 02/14/15 0415 02/14/15 0735 02/15/15 0330 02/16/15 0405  NA 136 136  --  140 138 139  K 4.6 4.4  --  3.8 3.9 3.7  CL 96 98  --  101 102 104  CO2 33* 33*  --  32 24 32  GLUCOSE 187* 120*  --  141* 229* 190*  BUN 43* 48*  --  45* 42* 38*  CREATININE 3.26* 3.93*  --  3.55* 3.15* 2.60*  CALCIUM 8.4 8.3*  --  8.3* 8.6 8.7  MG 2.4 2.5 2.5  --  2.4 2.1  PHOS  --   --   --   --  6.0* 5.5*   CBC:  Recent Labs Lab 02/10/15 0520 02/10/15 1945 02/11/15 0408 02/13/15 0445 02/14/15 0735  WBC 5.0 5.4 4.9 4.5 4.3  NEUTROABS 3.3  --   --   --   --   HGB 7.8* 8.7* 8.6* 8.5* 8.4*  HCT 23.9* 26.7* 26.1* 25.8* 25.5*  MCV 87.5 88.1 85.9 86.9 85.0  PLT 287 294 275 288 283   BNP (last 3 results)  Recent Labs  01/22/15 0347 02/02/15 1035 02/11/15 0510  BNP 218.9* 629.0* 182.2*    CBG:  Recent Labs Lab 02/15/15 1135 02/15/15 1633 02/15/15 2101 02/16/15 0741 02/16/15 1148  GLUCAP 346* 207* 204* 125* 184*    No results found for this or any previous visit (from the past 240 hour(s)).   Studies: No results found.  Scheduled Meds: . amLODipine  5 mg Oral Daily  . ampicillin (OMNIPEN) IV  2 g Intravenous 6 times per day  . carvedilol  50 mg Oral BID WC  . cloNIDine  0.2 mg Oral BID  . darbepoetin (ARANESP) injection - NON-DIALYSIS  60 mcg Subcutaneous Q Tue-1800  . docusate sodium  100 mg Oral BID  . feeding supplement (ENSURE COMPLETE)  237 mL Oral TID WC  . furosemide  40 mg Intravenous BID  . hydrALAZINE  50 mg Oral 3 times per day  . Influenza vac split quadrivalent PF  0.5 mL Intramuscular Tomorrow-1000  . insulin aspart  0-9 Units Subcutaneous TID WC  .  insulin aspart  4 Units Subcutaneous TID WC  . insulin detemir  10 Units Subcutaneous QHS  . isosorbide mononitrate  90 mg Oral BID  . polyethylene glycol  17 g Oral BID  . sodium chloride  3 mL Intravenous Q12H    Continuous Infusions: . sodium chloride 10 mL/hr at 02/14/15 1402      Simya Tercero  [facility] Pager [phone]. If 7PM-7AM, please contact night-coverage at [email or web address], password [facility] 02/16/2015, 2:45 PM  LOS: 14 days

## 2015-02-16 NOTE — Progress Notes (Signed)
Patient ID: Dwayne [name], male   DOB: [birth year], 35 y.o.   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 amLODipine  5 mg Oral Daily  . ampicillin (OMNIPEN) IV  2 g Intravenous 6 times per day  . carvedilol  50 mg Oral BID WC  . cloNIDine  0.2 mg Oral BID  . darbepoetin (ARANESP) injection - NON-DIALYSIS  60 mcg Subcutaneous Q Tue-1800  . docusate sodium  100 mg Oral BID  . feeding supplement (ENSURE COMPLETE)  237 mL Oral TID WC  . furosemide  40 mg Intravenous BID  . hydrALAZINE  50 mg Oral 3 times per day  . Influenza vac split quadrivalent PF  0.5 mL Intramuscular Tomorrow-1000  . insulin aspart  0-9 Units Subcutaneous TID WC  . insulin aspart  4 Units Subcutaneous TID WC  . insulin detemir  10 Units Subcutaneous QHS  . isosorbide mononitrate  90 mg Oral BID  . polyethylene glycol  17 g Oral BID  . sodium chloride  3 mL Intravenous Q12H    Subjective: He states that he is feeling ok. Review of Systems: Pertinent items are noted in HPI.  Past Medical History  Diagnosis Date  . Hypertension   . Acute renal failure   . Bacteremia     MSSA   . Diabetes type 2, uncontrolled 01/09/2015  . 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 Essential hypertension, benign 01/18/2014  . Infective myositis of left thigh   . Noncompliance 01/18/2014  . Septic shock   . Staphylococcus aureus bacteremia with sepsis   . Tobacco abuse  01/15/2014  . Mitral regurgitation 01/13/2015    Severe by TEE  . MSSA (methicillin susceptible Staphylococcus aureus)   . Type II diabetes mellitus     not controlled  . Diabetic foot infection 01/15/2014    History  Substance Use Topics  . Smoking status: Former Smoker -- 12 years    Types: Cigars    Quit date: 01/09/2015  . Smokeless tobacco: Never Used  . Alcohol Use: Yes     Comment: 02/02/2015 "I'll have 2-3 mixed drinks 2-3 times/yr"    History reviewed. No pertinent family history. No Known Allergies  OBJECTIVE: Blood pressure 150/80, pulse 72, temperature 98.4 F (36.9 C), temperature source Oral, resp. rate 16, height  (1.88 m), weight 252 lb 13.9 oz (114.7 kg), SpO2 96 %. General: he is in no distress Skin: no rash Lungs: clear Cor: regular S1 and S2 with a one over two or 6 systolic murmur Abdomen: soft and nontender  Lab Results Lab Results  Component Value Date   WBC 4.3 02/14/2015   HGB 8.4* 02/14/2015   HCT 25.5* 02/14/2015   MCV 85.0 02/14/2015   PLT 283 02/14/2015    Lab Results  Component Value Date   CREATININE 2.60*  02/16/2015   BUN 38* 02/16/2015   NA 139 02/16/2015   K 3.7 02/16/2015   CL 104 02/16/2015   CO2 32 02/16/2015    Lab Results  Component Value Date   ALT 20 02/02/2015   AST 18 02/02/2015   ALKPHOS 112 02/02/2015   BILITOT 0.3 02/02/2015     Microbiology: No results found for this or any previous visit (from the past 240 hour(s)).  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  Plan: 1. Continue ampicillin  Cliffton AstersJohn Hilberto Burzynski, MD [facility] [phone] pager   [phone] cell 02/16/2015, 12:28 PM

## 2015-02-16 NOTE — Progress Notes (Signed)
CARDIAC REHAB PHASE I   PRE:  Rate/Rhythm: 78 SR  BP:  Supine:   Sitting: 118/72  Standing:    SaO2: 99 RA  MODE:  Ambulation: 500 ft   POST:  Rate/Rhythm: 76  BP:  Supine:   Sitting: 124/82  Standing:    SaO2: 98 RA 1415-1440  Assisted X 1 and pt held to IV pole to ambulate. He shuffles his feet with ambulating. Pt was able to walk 500 feet without c/o. VS stable Pt back to side of bed after walk. He did respond to my questions, but did not offer much conversation.  Melina CopaLisa Nahom Carfagno RN 02/16/2015 2:39 PM

## 2015-02-16 NOTE — Progress Notes (Addendum)
Subjective:  He denies any SOB at rest. First time I have seen him sitting up.  Objective:  Vital Signs in the last 24 hours: Temp:  [98 F (36.7 C)-99.2 F (37.3 C)] 98.8 F (37.1 C) (02/17 0735) Pulse Rate:  [81-91] 81 (02/17 0735) Resp:  [12-18] 14 (02/17 0735) BP: (122-150)/(65-82) 150/82 mmHg (02/17 0735) SpO2:  [96 %-100 %] 96 % (02/17 0735)  Intake/Output from previous day:  Intake/Output Summary (Last 24 hours) at 02/16/15 0847 Last data filed at 02/16/15 0738  Gross per 24 hour  Intake   1110 ml  Output   3850 ml  Net  -2740 ml    Physical Exam: General appearance: alert, cooperative and no distress Lungs: clear to auscultation bilaterally Heart: regular rate and rhythm and 2/6 systolic murmur   Rate: 78  Rhythm: normal sinus rhythm  Lab Results:  Recent Labs  02/14/15 0735  WBC 4.3  HGB 8.4*  PLT 283    Recent Labs  02/15/15 0330 02/16/15 0405  NA 138 139  K 3.9 3.7  CL 102 104  CO2 24 32  GLUCOSE 229* 190*  BUN 42* 38*  CREATININE 3.15* 2.60*   No results for input(s): TROPONINI in the last 72 hours.  Invalid input(s): CK, MB No results for input(s): INR in the last 72 hours.  Imaging: Imaging results have been reviewed  Cardiac Studies:  Assessment/Plan:  35 year old male discharged on 01/27/2015 after admission for mitral valve endocarditis with MSSA. This was complicated by septic emboli to the brain and possible emboli of the kidney and lower extremity as well. 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 He had normal LV function and moderate to severe LVH. A left heart cath on 01/24/2015 showed normal coronarys and moderate pulmonary hypertension with elevated LV filling pressures with large V wave consistent with severe MR. Patient underwent dental extraction on 01/25/2015. He was  evaluated by cardiothoracic surgery who favored 6 week course of antibiotics before proceeding with mitral valve replacement. That  hospitalization was also complicated by acute diastolic congestive heart failure, acute renal insufficiency, pericarditis with small fibrous pericardial effusion, diabetes mellitus, anemia (2 units PRBCs), hypokalemia, osteomyelitis of T3 and T4 myositis in the area of L4 and L5, and right shoulder with septic arthritis/synovitis. He was re admitted 02/02/15 with CHF. He underwent should debridement 01/615. He has CHF with acute on chronic renal insufficiency with diuresis. This unfortunately has delayed his MV surgery.   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 Will ask RN to review Lasix orders with Dr Kathrene Bongo as it appears he still has Lasix 40 mg IV BID ordered.   Corine Shelter PA-C Beeper [phone] 02/16/2015, 8:47 AM  The patient was seen, examined and discussed with Corine Shelter, PA-C and I agree with the above.   35 year old male awaiting MV repair/possible replacement for Staph au endocarditis, with acute on chronic systolic CHF, acute on chronic kidney failure  and hypertension.  He continues to have negative fluid balance and very good diuresis without Lasix, Crea is improving significantly, now 2.6. Agree with starting lasix tonight and follow crea tomorrow. Tentative schedule for surgery is this Friday.  BP is better controlled.   Lars MassonNELSON, Toya Palacios H, MD 02/16/2015

## 2015-02-16 NOTE — Progress Notes (Signed)
Subjective:  Tired this AM- great UOP on minimal lasix- BP still good with modifications of BP meds, creatinine trending down nicely Objective Vital signs in last 24 hours: Filed Vitals:   02/15/15 2000 02/16/15 0027 02/16/15 0415 02/16/15 0735  BP: 132/70 122/75 137/78 150/82  Pulse: 83 91 81 81  Temp: 98 F (36.7 C) 98 F (36.7 C) 98.5 F (36.9 C) 98.8 F (37.1 C)  TempSrc:   Oral Oral  Resp: 12 14 16 14   Height:      Weight:      SpO2: 98% 98% 99% 96%   Weight change:   Intake/Output Summary (Last 24 hours) at 02/16/15 0758 Last data filed at 02/16/15 [id number]  Gross per 24 hour  Intake   1350 ml  Output   4150 ml  Net  -2800 ml    Assessment/Plan: 35 year old black male with history of poorly controlled hypertension and diabetes - diagnosed last month with MSSA bacteremia and 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 He also has some CKD with baseline creatinine in the high ones up to 2 with recent acute on chronic renal failure brought on by diuresis and likely decreased renal profusion.  1.Renal- I suspect that he likely has some baseline renal insufficiency and CKD on the basis of his DM and HTN.  I suspect that his most recent acute on chronic renal failure is a result of relative hypotension and decreased renal perfusion. It is true that this was in the setting of diuresis but I don't believe that he was over diuresed per se. Fortunately, his renal function appears to be heading in the right direction at this time and he is diuresing. U/A has not been resulted.  I do not detect any obvious nephrotoxins. I would like to see his creatinine closer to 2 before he's actually taken to the OR to lessen his risk of postoperative acute on chronic renal failure. I think that Friday is a good day to aim for and have discussed this with Dr. Cornelius Moraswen.  We will never be able to take his kidney risk to zero and patient understands that.  We just have to  hope that they will continue to be resilient 2. Hypertension/volume - he has massive edema. Not sure how much this has to do with his mitral regurg, he has hypoalbuminemia which is going to make him more prone to edema and is going to be difficult fix at this juncture.  He is on a supplement.  So far, diuresing and blood pressure is reasonable, no changes to meds today.  3. Anemia - is not helping his volume status either, have added ESA to help boost- may need transfusion   Aylla Huffine A    Labs: Basic Metabolic Panel:  Recent Labs Lab 02/14/15 0735 02/15/15 0330 02/16/15 0405  NA 140 138 139  K 3.8 3.9 3.7  CL 101 102 104  CO2 32 24 32  GLUCOSE 141* 229* 190*  BUN 45* 42* 38*  CREATININE 3.55* 3.15* 2.60*  CALCIUM 8.3* 8.6 8.7  PHOS  --  6.0* 5.5*   Liver Function Tests:  Recent Labs Lab 02/15/15 0330 02/16/15 0405  ALBUMIN 1.9* 1.9*   No results for input(s): LIPASE, AMYLASE in the last 168 hours. No results for input(s): AMMONIA in the last 168 hours. CBC:  Recent Labs Lab 02/10/15 0520 02/10/15 1945 02/11/15 0408 02/13/15 0445 02/14/15 0735  WBC 5.0 5.4 4.9 4.5 4.3  NEUTROABS 3.3  --   --   --   --  HGB 7.8* 8.7* 8.6* 8.5* 8.4*  HCT 23.9* 26.7* 26.1* 25.8* 25.5*  MCV 87.5 88.1 85.9 86.9 85.0  PLT 287 294 275 288 283   Cardiac Enzymes: No results for input(s): CKTOTAL, CKMB, CKMBINDEX, TROPONINI in the last 168 hours. CBG:  Recent Labs Lab 02/15/15 0738 02/15/15 1135 02/15/15 1633 02/15/15 2101 02/16/15 0741  GLUCAP 161* 346* 207* 204* 125*    Iron Studies: No results for input(s): IRON, TIBC, TRANSFERRIN, FERRITIN in the last 72 hours. Studies/Results: No results found. Medications: Infusions: . sodium chloride 10 mL/hr at 02/14/15 1402    Scheduled Medications: . amLODipine  5 mg Oral Daily  . ampicillin (OMNIPEN) IV  2 g Intravenous 4 times per day  . carvedilol  50 mg Oral BID WC  . cloNIDine  0.2 mg Oral BID  .  darbepoetin (ARANESP) injection - NON-DIALYSIS  60 mcg Subcutaneous Q Tue-1800  . docusate sodium  100 mg Oral BID  . feeding supplement (ENSURE COMPLETE)  237 mL Oral TID WC  . furosemide  40 mg Intravenous BID  . hydrALAZINE  50 mg Oral 3 times per day  . Influenza vac split quadrivalent PF  0.5 mL Intramuscular Tomorrow-1000  . insulin aspart  0-9 Units Subcutaneous TID WC  . insulin aspart  4 Units Subcutaneous TID WC  . insulin detemir  10 Units Subcutaneous QHS  . isosorbide mononitrate  90 mg Oral BID  . polyethylene glycol  17 g Oral BID  . sodium chloride  3 mL Intravenous Q12H    have reviewed scheduled and prn medications.  Physical Exam: General: sitting up on side of bed- very flat affect Heart: RRR Lungs: mostly clear Abdomen: soft, non tender Extremities: pitting edema- but better    02/16/2015,7:58 AM  LOS: 14 days

## 2015-02-16 NOTE — Progress Notes (Signed)
ANTIBIOTIC CONSULT NOTE - FOLLOW UP  Pharmacy Consult for Ampicillin Indication: endocarditis  No Known Allergies  Patient Measurements: Height: 6\' 2"  (188 cm) Weight: 252 lb 13.9 oz (114.7 kg) IBW/kg (Calculated) : 82.2  Vital Signs: Temp: 98.8 F (37.1 C) (02/17 0735) Temp Source: Oral (02/17 0735) BP: 150/82 mmHg (02/17 0735) Pulse Rate: 81 (02/17 0735) Intake/Output from previous day: 02/16 0701 - 02/17 0700 In: 1350 [P.O.:1200; IV Piggyback:150] Out: 3650 [Urine:3650] Intake/Output from this shift: Total I/O In: -  Out: 775 [Urine:775]  Labs:  Recent Labs  02/14/15 0735 02/15/15 0330 02/16/15 0405  WBC 4.3  --   --   HGB 8.4*  --   --   PLT 283  --   --   CREATININE 3.55* 3.15* 2.60*   Estimated Creatinine Clearance: 53.9 mL/min (by C-G formula based on Cr of 2.6). No results for input(s): VANCOTROUGH, VANCOPEAK, VANCORANDOM, GENTTROUGH, GENTPEAK, GENTRANDOM, TOBRATROUGH, TOBRAPEAK, TOBRARND, AMIKACINPEAK, AMIKACINTROU, AMIKACIN in the last 72 hours.   Microbiology: Recent Results (from the past 720 hour(s))  Culture, blood (routine x 2)     Status: None   Collection Time: 01/18/15  6:10 PM  Result Value Ref Range Status   Specimen Description BLOOD LEFT ARM  Final   Special Requests BOTTLES DRAWN AEROBIC AND ANAEROBIC 10CC  Final   Culture   Final    NO GROWTH 5 DAYS Performed at [facility]    Report Status 01/25/2015 FINAL  Final  Culture, blood (routine x 2)     Status: None   Collection Time: 01/18/15  6:18 PM  Result Value Ref Range Status   Specimen Description BLOOD RIGHT ARM  Final   Special Requests BOTTLES DRAWN AEROBIC AND ANAEROBIC 10CC  Final   Culture   Final    NO GROWTH 5 DAYS Performed at [facility]    Report Status 01/25/2015 FINAL  Final  Surgical pcr screen     Status: None   Collection Time: 01/25/15  4:47 AM  Result Value Ref Range Status   MRSA, PCR NEGATIVE NEGATIVE Final   Staphylococcus aureus  NEGATIVE NEGATIVE Final    Comment:        The Xpert SA Assay (FDA approved for NASAL specimens in patients over 35 years of age), is one component of a comprehensive surveillance program.  Test performance has been validated by [facility] for patients greater than or equal to 35 year old. It is not intended to diagnose infection nor to guide or monitor treatment.   Culture, blood (routine x 2)     Status: None   Collection Time: 02/02/15  3:53 PM  Result Value Ref Range Status   Specimen Description BLOOD PICC LINE  Final   Special Requests BOTTLES DRAWN AEROBIC AND ANAEROBIC 5ML  Final   Culture   Final    NO GROWTH 5 DAYS Performed at [facility]    Report Status 02/08/2015 FINAL  Final  Culture, blood (routine x 2)     Status: None   Collection Time: 02/02/15  4:32 PM  Result Value Ref Range Status   Specimen Description BLOOD PICC LINE  Final   Special Requests BOTTLES DRAWN AEROBIC AND ANAEROBIC 10CC  Final   Culture   Final    NO GROWTH 5 DAYS Performed at [facility]    Report Status 02/08/2015 FINAL  Final  MRSA PCR Screening     Status: None   Collection Time: 02/02/15  5:14  PM  Result Value Ref Range Status   MRSA by PCR NEGATIVE NEGATIVE Final    Comment:        The GeneXpert MRSA Assay (FDA approved for NASAL specimens only), is one component of a comprehensive MRSA colonization surveillance program. It is not intended to diagnose MRSA infection nor to guide or monitor treatment for MRSA infections.   Body fluid culture     Status: None   Collection Time: 02/04/15  5:45 PM  Result Value Ref Range Status   Specimen Description SYNOVIAL FLUID SHOULDER RIGHT  Final   Special Requests NONE  Final   Gram Stain   Final    FEW WBC PRESENT, PREDOMINANTLY PMN NO ORGANISMS SEEN Performed at [facility]    Culture   Final    NO GROWTH 3 DAYS Performed at [facility]    Report Status 02/08/2015 FINAL  Final   Surgical pcr screen     Status: None   Collection Time: 02/05/15  5:06 AM  Result Value Ref Range Status   MRSA, PCR NEGATIVE NEGATIVE Final   Staphylococcus aureus NEGATIVE NEGATIVE Final    Comment:        The Xpert SA Assay (FDA approved for NASAL specimens in patients over 24 years of age), is one component of a comprehensive surveillance program.  Test performance has been validated by [facility] for patients greater than or equal to 30 year old. It is not intended to diagnose infection nor to guide or monitor treatment.     Anti-infectives    Start     Dose/Rate Route Frequency Ordered Stop   02/16/15 1200  ampicillin (OMNIPEN) 2 g in sodium chloride 0.9 % 50 mL IVPB     2 g 150 mL/hr over 20 Minutes Intravenous 6 times per day 02/16/15 1024     02/13/15 1800  ampicillin (OMNIPEN) 2 g in sodium chloride 0.9 % 50 mL IVPB  Status:  Discontinued     2 g 150 mL/hr over 20 Minutes Intravenous 4 times per day 02/13/15 1309 02/16/15 1024   02/02/15 1700  ampicillin (OMNIPEN) 2 g in sodium chloride 0.9 % 50 mL IVPB  Status:  Discontinued     2 g 150 mL/hr over 20 Minutes Intravenous Every 4 hours 02/02/15 1638 02/13/15 1309      Assessment: 35 yo M presents on 2/3 with CP. 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 Pharmacy to continue dosing ampicillin for endocarditis with perforation, septic emboli to the brain and right septic shoulder and thoracic spine discitis. Now s/p arthroscopic debridement and subacromial decompression for septic right shoulder. Plans for valve replacement on Friday when renal function improves further.  ID did see last admit (1/29) and plans were 6 weeks of ampicillin with day 1 being 1/14. Of note, pt was on a once a day ampicillin 12g over 24hrs for ease of administration at home.  Patient remains afebrile with WBC wnl. SCr has improved over the past few days- currently SCr 2.6 with est CrCl ~65mL/min.  Plan:  -  Adjust ampicillin to 2 gm IV q4h with improved renal function - Continue to monitor renal function, temp, WBC  Dixon Luczak D. Sherry Blackard, PharmD, BCPS Clinical Pharmacist Pager: [phone] 02/16/2015 10:26 AM

## 2015-02-16 NOTE — Progress Notes (Addendum)
Subjective:   He denies any SOB at rest.   . amLODipine  5 mg Oral Daily  . ampicillin (OMNIPEN) IV  2 g Intravenous 4 times per day  . carvedilol  50 mg Oral BID WC  . cloNIDine  0.2 mg Oral BID  . darbepoetin (ARANESP) injection - NON-DIALYSIS  60 mcg Subcutaneous Q Tue-1800  . docusate sodium  100 mg Oral BID  . feeding supplement (ENSURE COMPLETE)  237 mL Oral TID WC  . furosemide  40 mg Intravenous BID  . hydrALAZINE  50 mg Oral 3 times per day  . Influenza vac split quadrivalent PF  0.5 mL Intramuscular Tomorrow-1000  . insulin aspart  0-9 Units Subcutaneous TID WC  . insulin aspart  4 Units Subcutaneous TID WC  . insulin detemir  10 Units Subcutaneous QHS  . isosorbide mononitrate  90 mg Oral BID  . polyethylene glycol  17 g Oral BID  . sodium chloride  3 mL Intravenous Q12H   Objective:  Vital Signs in the last 24 hours: Temp:  [98 F (36.7 C)-99.2 F (37.3 C)] 98.8 F (37.1 C) (02/17 0735) Pulse Rate:  [81-91] 81 (02/17 0735) Resp:  [12-18] 14 (02/17 0735) BP: (122-150)/(65-82) 150/82 mmHg (02/17 0735) SpO2:  [96 %-100 %] 96 % (02/17 0735)  Intake/Output from previous day:  Intake/Output Summary (Last 24 hours) at 02/16/15 0848 Last data filed at 02/16/15 0738  Gross per 24 hour  Intake   1110 ml  Output   3850 ml  Net  -2740 ml   Physical Exam: General appearance: alert, cooperative and no distress Lungs: clear to auscultation bilaterally Heart: regular rate and rhythm and 2/6 systolic murmur   Rate: 78  Rhythm: normal sinus rhythm  Lab Results:  Recent Labs  02/14/15 0735  WBC 4.3  HGB 8.4*  PLT 283    Recent Labs  02/15/15 0330 02/16/15 0405  NA 138 139  K 3.9 3.7  CL 102 104  CO2 24 32  GLUCOSE 229* 190*  BUN 42* 38*  CREATININE 3.15* 2.60*   No results for input(s): TROPONINI in the last 72 hours.  Invalid input(s): CK, MB No results for input(s): INR in the last 72 hours.  Imaging: Imaging results have been  reviewed     Assessment/Plan:   35 year old male discharged on 01/27/2015 after admission for mitral valve endocarditis with MSSA. This was complicated by septic emboli to the brain and possible emboli of the kidney and lower extremity as well. 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 He had normal LV function and moderate to severe LVH. A left heart cath on 01/24/2015 showed normal coronarys and moderate pulmonary hypertension with elevated LV filling pressures with large V wave consistent with severe MR. Patient underwent dental extraction on 01/25/2015. He was evaluated by cardiothoracic surgery who favored 6 week course of antibiotics before proceeding with mitral valve replacement. That  hospitalization was also complicated by acute diastolic congestive heart failure, acute renal insufficiency, pericarditis with small fibrous pericardial effusion, diabetes mellitus, anemia (2 units PRBCs), hypokalemia, osteomyelitis of T3 and T4 myositis in the area of L4 and L5, and right shoulder with septic arthritis/synovitis. He was re admitted 02/02/15 with CHF. He underwent should debridement 01/615. He has CHF with acute on chronic renal insufficiency with diuresis. This unfortunately has delayed his MV surgery.  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   35 year old male  awaiting MV repair/possible replacement for Staph au endocarditis, with acute on chronic systolic CHF, acute on chronic kidney failure and hypertension.  He continues to have negative fluid balance and very good diuresis without Lasix, Crea is improving significantly, now 2.6. Agree with starting lasix tonight and follow crea tomorrow. Tentative schedule for surgery is this Friday.  BP is better controlled.   Lars Masson, MD 02/16/2015

## 2015-02-16 NOTE — Progress Notes (Signed)
Reviewed Lasix order with Dr. Ervin KnackGoldborough. Lasix order is correct and does not need to be modified. Notified Corine ShelterLuke Kilroy, [place]. Will continue to monitor.

## 2015-02-17 DIAGNOSIS — I340 Nonrheumatic mitral (valve) insufficiency: Secondary | ICD-10-CM

## 2015-02-17 LAB — RENAL FUNCTION PANEL
ANION GAP: 5 (ref 5–15)
Albumin: 1.9 g/dL — ABNORMAL LOW (ref 3.5–5.2)
BUN: 31 mg/dL — AB (ref 6–23)
CHLORIDE: 104 mmol/L (ref 96–112)
CO2: 29 mmol/L (ref 19–32)
Calcium: 8.6 mg/dL (ref 8.4–10.5)
Creatinine, Ser: 2.19 mg/dL — ABNORMAL HIGH (ref 0.50–1.35)
GFR calc non Af Amer: 37 mL/min — ABNORMAL LOW (ref >90)
GFR, EST AFRICAN AMERICAN: 43 mL/min — AB (ref >90)
Glucose, Bld: 157 mg/dL — ABNORMAL HIGH (ref 70–99)
Phosphorus: 4.9 mg/dL — ABNORMAL HIGH (ref 2.3–4.6)
Potassium: 3.5 mmol/L (ref 3.5–5.1)
Sodium: 138 mmol/L (ref 135–145)

## 2015-02-17 LAB — GLUCOSE, CAPILLARY
Glucose-Capillary: 125 mg/dL — ABNORMAL HIGH (ref 70–99)
Glucose-Capillary: 155 mg/dL — ABNORMAL HIGH (ref 70–99)
Glucose-Capillary: 230 mg/dL — ABNORMAL HIGH (ref 70–99)
Glucose-Capillary: 268 mg/dL — ABNORMAL HIGH (ref 70–99)

## 2015-02-17 LAB — PREPARE RBC (CROSSMATCH)

## 2015-02-17 LAB — MAGNESIUM: MAGNESIUM: 2 mg/dL (ref 1.5–2.5)

## 2015-02-17 MED ORDER — VANCOMYCIN HCL 10 G IV SOLR
1500.0000 mg | INTRAVENOUS | Status: AC
  Administered 2015-02-18: 1000 mg via INTRAVENOUS
  Filled 2015-02-17: qty 1500

## 2015-02-17 MED ORDER — PHENYLEPHRINE HCL 10 MG/ML IJ SOLN
30.0000 ug/min | INTRAVENOUS | Status: AC
  Administered 2015-02-18: 25 ug/min via INTRAVENOUS
  Filled 2015-02-17: qty 2

## 2015-02-17 MED ORDER — SODIUM CHLORIDE 0.9 % IV SOLN
INTRAVENOUS | Status: DC
  Filled 2015-02-17: qty 30

## 2015-02-17 MED ORDER — VANCOMYCIN HCL 1000 MG IV SOLR
INTRAVENOUS | Status: AC
  Administered 2015-02-18: 1000 mL
  Filled 2015-02-17: qty 1000

## 2015-02-17 MED ORDER — DEXMEDETOMIDINE HCL IN NACL 400 MCG/100ML IV SOLN
0.1000 ug/kg/h | INTRAVENOUS | Status: AC
  Administered 2015-02-18: 0.2 ug/kg/h via INTRAVENOUS
  Administered 2015-02-18: 16:00:00 via INTRAVENOUS
  Filled 2015-02-17: qty 100

## 2015-02-17 MED ORDER — PLASMA-LYTE 148 IV SOLN
INTRAVENOUS | Status: AC
  Administered 2015-02-18: 500 mL
  Filled 2015-02-17: qty 2.5

## 2015-02-17 MED ORDER — SODIUM CHLORIDE 0.9 % IV SOLN
INTRAVENOUS | Status: DC
  Filled 2015-02-17: qty 2.5

## 2015-02-17 MED ORDER — TEMAZEPAM 15 MG PO CAPS: 15.0000 mg | ORAL_CAPSULE | Freq: Once | ORAL | Status: AC | PRN

## 2015-02-17 MED ORDER — DOPAMINE-DEXTROSE 3.2-5 MG/ML-% IV SOLN
0.0000 ug/kg/min | INTRAVENOUS | Status: DC
  Filled 2015-02-17: qty 250

## 2015-02-17 MED ORDER — DEXTROSE 5 % IV SOLN
750.0000 mg | INTRAVENOUS | Status: DC
  Filled 2015-02-17: qty 750

## 2015-02-17 MED ORDER — MAGNESIUM SULFATE 50 % IJ SOLN
40.0000 meq | INTRAMUSCULAR | Status: DC
  Filled 2015-02-17: qty 10

## 2015-02-17 MED ORDER — DEXTROSE 5 % IV SOLN
0.0000 ug/min | INTRAVENOUS | Status: DC
  Filled 2015-02-17: qty 4

## 2015-02-17 MED ORDER — BISACODYL 5 MG PO TBEC
5.0000 mg | DELAYED_RELEASE_TABLET | Freq: Once | ORAL | Status: AC
  Administered 2015-02-17: 5 mg via ORAL

## 2015-02-17 MED ORDER — DEXTROSE 5 % IV SOLN
1.5000 g | INTRAVENOUS | Status: AC
  Administered 2015-02-18: .75 g via INTRAVENOUS
  Administered 2015-02-18: 1.5 g via INTRAVENOUS
  Filled 2015-02-17: qty 1.5

## 2015-02-17 MED ORDER — NITROGLYCERIN IN D5W 200-5 MCG/ML-% IV SOLN
2.0000 ug/min | INTRAVENOUS | Status: DC
  Filled 2015-02-17: qty 250

## 2015-02-17 MED ORDER — SODIUM CHLORIDE 0.9 % IV SOLN
INTRAVENOUS | Status: DC
  Filled 2015-02-17: qty 40

## 2015-02-17 MED ORDER — POTASSIUM CHLORIDE 2 MEQ/ML IV SOLN
80.0000 meq | INTRAVENOUS | Status: DC
  Filled 2015-02-17: qty 40

## 2015-02-17 MED ORDER — CHLORHEXIDINE GLUCONATE 4 % EX LIQD
60.0000 mL | Freq: Once | CUTANEOUS | Status: AC
  Administered 2015-02-18: 4 via TOPICAL
  Filled 2015-02-17: qty 60

## 2015-02-17 MED ORDER — CHLORHEXIDINE GLUCONATE 4 % EX LIQD
60.0000 mL | Freq: Once | CUTANEOUS | Status: AC
  Administered 2015-02-17: 4 via TOPICAL
  Filled 2015-02-17: qty 15

## 2015-02-17 MED ORDER — METOPROLOL TARTRATE 12.5 MG HALF TABLET
12.5000 mg | ORAL_TABLET | Freq: Once | ORAL | Status: AC
  Administered 2015-02-18: 12.5 mg via ORAL
  Filled 2015-02-17: qty 1

## 2015-02-17 NOTE — Progress Notes (Signed)
Patient ID: Dwayne [name], male   DOB: 09/04/1980, 35 y.o.   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 [START ON 02/18/2015] aminocaproic acid (AMICAR) for OHS   Intravenous To OR  . amLODipine  5 mg Oral Daily  . ampicillin (OMNIPEN) IV  2 g Intravenous 6 times per day  . bisacodyl  5 mg Oral Once  . carvedilol  50 mg Oral BID WC  . [START ON 02/18/2015] cefUROXime (ZINACEF)  IV  1.5 g Intravenous To OR  . [START ON 02/18/2015] cefUROXime (ZINACEF)  IV  750 mg Intravenous To OR  . chlorhexidine  60 mL Topical Once   And  . [START ON 02/18/2015] chlorhexidine  60 mL Topical Once  . cloNIDine  0.2 mg Oral BID  . darbepoetin (ARANESP) injection - NON-DIALYSIS  60 mcg Subcutaneous Q Tue-1800  . [START ON 02/18/2015] dexmedetomidine  0.1-0.7 mcg/kg/hr Intravenous To OR  . docusate sodium  100 mg Oral BID  . [START ON 02/18/2015] DOPamine  0-10 mcg/kg/min Intravenous To OR  . [START ON 02/18/2015] epinephrine  0-10 mcg/min Intravenous To OR  . feeding supplement (ENSURE COMPLETE)  237 mL Oral TID WC  . furosemide  40 mg Intravenous BID  . [START ON 02/18/2015] heparin-papaverine-plasmalyte irrigation   Irrigation To OR  . [START ON 02/18/2015] heparin 30,000 units/NS 1000 mL solution for CELLSAVER   Other To OR  . hydrALAZINE  50 mg Oral 3 times per day  . Influenza vac split quadrivalent PF  0.5 mL Intramuscular Tomorrow-1000  . insulin  aspart  0-9 Units Subcutaneous TID WC  . insulin aspart  4 Units Subcutaneous TID WC  . insulin detemir  10 Units Subcutaneous QHS  . [START ON 02/18/2015] insulin (NOVOLIN-R) infusion   Intravenous To OR  . isosorbide mononitrate  90 mg Oral BID  . [START ON 02/18/2015] magnesium sulfate  40 mEq Other To OR  . [START ON 02/18/2015] metoprolol tartrate  12.5 mg Oral Once  . [START ON 02/18/2015] nitroGLYCERIN  2-200 mcg/min Intravenous To OR  . [START ON 02/18/2015] phenylephrine (NEO-SYNEPHRINE) Adult infusion  30-200 mcg/min Intravenous To OR  . polyethylene glycol  17 g Oral BID  . [START ON 02/18/2015] potassium chloride  80 mEq Other To OR  . sodium chloride  3 mL Intravenous Q12H  . [START ON 02/18/2015] vancomycin 1000 mg in NS (1000 ml) irrigation for Dr. Cornelius Moraswen case   Irrigation To OR  . [START ON 02/18/2015] vancomycin  1,500 mg Intravenous To OR    Subjective: He states that he is feeling the same. He is still having some right shoulder discomfort.  Review of Systems: Pertinent items are noted in HPI.  Past Medical History  Diagnosis Date  . Hypertension   . Acute renal failure   . Bacteremia  MSSA   . Diabetes type 2, uncontrolled 01/09/2015  . 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 Essential hypertension, benign 01/18/2014  . Infective myositis of left thigh   . Noncompliance 01/18/2014  . Septic shock   . Staphylococcus aureus bacteremia with sepsis   . Tobacco abuse 01/15/2014  . Mitral regurgitation 01/13/2015    Severe by TEE  . MSSA (methicillin susceptible Staphylococcus aureus)   . Type II diabetes mellitus     not controlled  . Diabetic foot infection 01/15/2014    History  Substance Use Topics  . Smoking status: Former Smoker -- 12 years    Types: Cigars    Quit date: 01/09/2015  . Smokeless tobacco: Never Used  . Alcohol Use: Yes     Comment: 02/02/2015 "I'll have 2-3 mixed drinks 2-3 times/yr"    History reviewed. No pertinent family history. No Known  Allergies  OBJECTIVE: Blood pressure 109/64, pulse 79, temperature 98.5 F (36.9 C), temperature source Oral, resp. rate 18, height  (1.88 m), weight 252 lb 12.8 oz (114.669 kg), SpO2 100 %.   General: he is in no distress. He is sitting on the side of his bed. He is preoccupied looking at his cell phone. He has a flat affect. His wife is visiting. Skin: no rash Lungs: clear Cor: regular S1 and S2 with a one over two or 6 systolic murmur Abdomen: soft and nontender Right shoulder: Mild diffuse swelling without redness or warmth. He has good range of motion without obvious pain  Lab Results Lab Results  Component Value Date   WBC 4.3 02/14/2015   HGB 8.4* 02/14/2015   HCT 25.5* 02/14/2015   MCV 85.0 02/14/2015   PLT 283 02/14/2015    Lab Results  Component Value Date   CREATININE 2.19* 02/17/2015   BUN 31* 02/17/2015   NA 138 02/17/2015   K 3.5 02/17/2015   CL 104 02/17/2015   CO2 29 02/17/2015    Lab Results  Component Value Date   ALT 20 02/02/2015   AST 18 02/02/2015   ALKPHOS 112 02/02/2015   BILITOT 0.3 02/02/2015     Microbiology: No results found for this or any previous visit (from the past 240 hour(s)).  Assessment:  He is responding well to ampicillin therapy for his penicillin sensitive Staph aureus mitral valve endocarditis.  Plan: 1. Continue ampicillin 2. Mitral valve repair or replacement tomorrow  Cliffton Asters, MD [facility] [phone] pager   [phone] cell 02/17/2015, 3:43 PM

## 2015-02-17 NOTE — Progress Notes (Signed)
Occupational Therapy Treatment Patient Details Name: Dwayne [name] Iodice MRN: [id number] DOB: 06/26/1980 Today's Date: 02/17/2015    History of present illness Dwayne [name] Deguire  is a 35 y.o. 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 He was planned to undergo open heart surgery for valve replacement in 4-6 weeks. admitted with chest pain, anasarca, scrotal edema, and R shoulder septic arthritis; Underwent R shoulder I& D and subacromial decompression on 2/6   OT comments  Pt seen today for therapeutic exercise of RUE and functional independence with ADLs. Pt reports that he notices an increase in ROM, but continues to be limited by pain. Pt able to achieve ~110* shoulder FF, ~10* past neutral ER, and ~95* Abduction. Pt is self-limiting due to pain but was agreeable to therapeutic exercises. Acute OT to continue to follow while inpatient.    Follow Up Recommendations  Home health OT;Supervision/Assistance - 24 hour    Equipment Recommendations  Tub/shower seat    Recommendations for Other Services      Precautions / Restrictions Restrictions Weight Bearing Restrictions: No       Mobility Bed Mobility               General bed mobility comments: EOB upon arrival  Transfers Overall transfer level: Independent                        ADL   Eating/Feeding: Set up;Sitting   Grooming: Set up;Sitting;Wash/dry hands                   Toilet Transfer: Supervision/safety;Ambulation           Functional mobility during ADLs: Supervision/safety General ADL Comments: Pt sitting EOB eating flavored ice. Pt reported  disappointment upon OT arrival stating "I know I'll be in more pain after you finish." Encouragement provided and reminded pt that therapeutic exercises increase functional use of his dominant, right arm. Pt participated in therapeutic exercises but was self-limiting due to pain.                 Cognition  Arousal/Alertness:Awake/Alert Behavior During Therapy: Flat affect;WFL for tasks assessed/performed Overall Cognitive Status: Within Functional Limits for tasks assessed                         Exercises Shoulder Exercises Pendulum Exercise: Right;10 reps;Standing Shoulder Flexion: AAROM;Right;10 reps;Seated (to ~110*) Shoulder ABduction: AAROM;Right;10 reps;Seated (to ~95*) Shoulder External Rotation: AAROM;Right;10 reps;Seated (to ~10* past neutral) Elbow Flexion: AROM;Right;10 reps;Seated Elbow Extension: AROM;Right;10 reps;Seated Wrist Flexion: AROM;Right;10 reps;Seated Wrist Extension: AROM;Right;10 reps;Seated Digit Composite Flexion: AROM;Right;10 reps;Seated Composite Extension: AROM;Right;10 reps;Seated Hand Exercises Forearm Supination: AROM;Right;10 reps;Seated Forearm Pronation: AROM;Right;10 reps;Seated           Pertinent Vitals/ Pain       Pain Assessment: Faces Faces Pain Scale: Hurts even more Pain Location: R shoulder with therapeutic exercises Pain Descriptors / Indicators: Aching;Sore Pain Intervention(s): Limited activity within patient's tolerance;Monitored during session;Repositioned         Frequency Min 3X/week     Progress Toward Goals  OT Goals(current goals can now be found in the care plan section)  Progress  towards OT goals: Progressing toward goals  Acute Rehab OT Goals Patient Stated Goal: to use my arm OT Goal Formulation: With patient Time For Goal Achievement: 02/21/15 Potential to Achieve Goals: Good  Plan Discharge plan remains appropriate       End of Session    Activity Tolerance Patient tolerated treatment  well   Patient Left with call bell/phone within reach;with family/visitor present;in bed   Nurse Communication          Time: 1610-9604 OT Time Calculation (min): 22 min  Charges: OT General Charges $OT Visit: 1 Procedure OT Treatments $Therapeutic Exercise: 8-22 mins  Nena Jordan M 02/17/2015, 2:23 PM  Carney Living, OTR/L Occupational Therapist [phone] (pager)

## 2015-02-17 NOTE — Progress Notes (Signed)
Inpatient Diabetes Program Recommendations  AACE/ADA: New Consensus Statement on Inpatient Glycemic Control (2013)  Target Ranges:  Prepandial:   less than 140 mg/dL      Peak postprandial:   less than 180 mg/dL (1-2 hours)      Critically ill patients:  140 - 180 mg/dL   Results for Dwayne [name], Dwayne [name] (MRN [id number]) as of 02/17/2015 10:42  Ref. Range 02/16/2015 07:41 02/16/2015 11:48 02/16/2015 16:34 02/16/2015 20:43 02/17/2015 07:55  Glucose-Capillary Latest Range: 70-99 mg/dL 409125 (H) 811184 (H) 914232 (H) 215 (H) 125 (H)    Diabetes history: DM 2 Outpatient Diabetes medications: 70/30 14 units BID Current orders for Inpatient glycemic control: Levemir 10 units QHS, Novolog 0-9 units TID, Novolog 4 units TID for meal coverage  Inpatient Diabetes Program Recommendations  Insulin - Meal Coverage: Please consider increasing meal coverage to Novolog 6 units TID with meals in addition to correction scale. Insulin - Correction: Please consider adding Novolog 0-5 units QHS for bedtime coverage. Diet: Please consider changing the frequency of the Ensure for hypoalbuminemia to be scheduled with meals. The blood glucose levels are being effected.  Thanks,  Christena DeemShannon Christoper Bushey RN, MSN, [facility] Inpatient Diabetes Coordinator Team Pager [phone]

## 2015-02-17 NOTE — Progress Notes (Signed)
[address]       [place], [id number]             [phone]     CARDIOTHORACIC SURGERY PROGRESS NOTE  12 Days Post-Op  S/P Procedure(s) (LRB): ARTHROSCOPY SHOULDER (Right)  Subjective: Still c/o soreness in shoulder, but mobility has improved somewhat.  No SOB.  No other complaints  Objective: Vital signs in last 24 hours: Temp:  [98 F (36.7 C)-98.9 F (37.2 C)] 98.9 F (37.2 C) (02/18 0500) Pulse Rate:  [72-87] 83 (02/18 0500) Cardiac Rhythm:  [-] Normal sinus rhythm (02/17 2100) Resp:  [16] 16 (02/18 0500) BP: (135-156)/(72-87) 152/79 mmHg (02/18 0500) SpO2:  [93 %-100 %] 93 % (02/18 0500) Weight:  [114.669 kg (252 lb 12.8 oz)] 114.669 kg (252 lb 12.8 oz) (02/18 0500)  Physical Exam:  Rhythm:   sinus  Breath sounds: clear  Heart sounds:  RRR w/ holosystolic murmur  Incisions:  Right shoulder incisions healing nicely  Abdomen:  Soft, non-distended, non-tender  Extremities:  Warm, well-perfused, decreased lower extremity edema   Intake/Output from previous day: 02/17 0701 - 02/18 0700 In: 890 [P.O.:840; IV Piggyback:50] Out: 3050 [Urine:3050] Intake/Output this shift:    Lab Results: No results for input(s): WBC, HGB, HCT, PLT in the last 72 hours. BMET:  Recent Labs  02/16/15 0405 02/17/15 0455  NA 139 138  K 3.7 3.5  CL 104 104  CO2 32 29  GLUCOSE 190* 157*  BUN 38* 31*  CREATININE 2.60* 2.19*  CALCIUM 8.7 8.6    CBG (last 3)   Recent Labs  02/16/15 1634 02/16/15 2043 02/17/15 0755  GLUCAP 232* 215* 125*   PT/INR:  No results for input(s): LABPROT, INR in the last 72 hours.  CXR:  CHEST 2 VIEW  COMPARISON: Single transit PA and lateral chest 02/02/2015 in 01/14/2015.  FINDINGS: There small bilateral pleural effusions with mild basilar atelectasis. Heart size is enlarged but no evidence of pulmonary edema is identified. Patchy airspace disease in the upper lung zones seen on the most recent examination are  no longer identified. Small pleural effusions are noted.  IMPRESSION: Patchy bilateral upper lobe airspace disease most examination has resolved.  Cardiomegaly and small effusions without evidence of pulmonary edema.   Electronically Signed  By: Drusilla Kannerhomas Dalessio M.D.  On: 02/11/2015 09:51  Assessment/Plan:  The patient and his wife were again counseled at length regarding the indications, risks and potential benefits of mitral valve repair.  The rationale for elective surgery has been explained, including a comparison between surgery and continued medical therapy with close follow-up.  The likelihood of successful and durable valve repair has been discussed with particular reference to the findings of their recent echocardiogram.  Based upon these findings and previous experience, I have quoted them a less than 50% percent likelihood of successful valve repair.  In the unlikely event that their valve cannot be successfully repaired, we discussed the possibility of replacing the mitral valve using a mechanical prosthesis with the attendant need for long-term anticoagulation versus the alternative of replacing it using a bioprosthetic tissue valve with its potential for late structural valve deterioration and failure, depending upon the patient's longevity.  The patient specifically requests that if the mitral valve must be replaced that it be done using a mechanical valve.   The patient understands and accepts all potential risks of surgery including but not limited to risk of death, stroke or other neurologic complication, myocardial infarction, congestive  heart failure, respiratory failure, renal failure, bleeding requiring transfusion and/or reexploration, arrhythmia, infection or other wound complications, pneumonia, pleural and/or pericardial effusion, pulmonary embolus, aortic dissection or other major vascular complication, or delayed complications related to valve repair or replacement  including but not limited to structural valve deterioration and failure, thrombosis, embolization, recurrent or persistent endocarditis, or paravalvular leak.  Alternative surgical approaches have been discussed including a comparison between conventional sternotomy and minimally-invasive techniques.  The relative risks and benefits of each have been reviewed as they pertain to the patient's specific circumstances, and all of their questions have been addressed.  Specific risks potentially related to the minimally-invasive approach were discussed at length, including but not limited to risk of conversion to full or partial sternotomy, aortic dissection or other major vascular complication, unilateral acute lung injury or pulmonary edema, phrenic nerve dysfunction or paralysis, rib fracture, chronic pain, lung hernia, or lymphocele. All of their questions have been answered.  For OR in am tomorrow.  I spent in excess of 15 minutes during the conduct of this hospital encounter and >50% of this time involved direct face-to-face encounter with the patient for counseling and/or coordination of their care.    OWEN,CLARENCE H 02/17/2015 8:43 AM

## 2015-02-17 NOTE — Progress Notes (Signed)
PROGRESS NOTE  Dwayne HickmanBenjamin Oley NWG:[id number] DOB: 09/14/1980 DOA: 02/02/2015 PCP: No PCP Per Patient  HPI/Recap of past 24 hours: s/p right shoulder washout (2/6). Still Complaining of pain right shoulder with movement. Denies CP, SOB and fever, BP much better. Edema has much improved Patient has experienced acute on chronic renal failure with Cr up to 3.5 at one point. Renal consulted and surgery postpone due to renal impairment. Cr improving.   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 Acute on chronic diastolic dysfunction EF 60%. 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 Also with diet and meds non-compliance. -Cr jump to 3.9 on 2/14; now trending down;  Lasix was on hold for a couple of days, now restarted, cr improving (Cr 2/15 3.5, 2/18 2.19) -on coreg/imdur/hydralazine/aisx -Patient encouraged to follow low sodium diet -daily weights and strict I's and O's -scrotal edema is resolved, no crackles on exam, no SOB or orthopnea and no JVD. less edema bilaterally, on lasix 40mg  iv bid  -continue TED hose -Cardiology following  2. Endocarditis: Continue IV ampicillin dosed by pharmacy for now. Infectious disease consulted; will follow rec's. Has left arm PICC line.  -surgery 2/19  3. Right shoulder septic arthritis.  -Mri shoulder on 2/5 concerning for septic arthritis/osteomyelitis; Dr. Lajoyce Cornersuda consulted. -2/6 patient was brought to the OR and had: Right shoulder ARTHROSCOPY SHOULDER Extensive debridement of the glenohumeral joint. Subacromial decompression.    -Orthopedic service to help with documentation of depth, type and instrumentation used.  4. Septic embolic infarcts: due to c/o facial numbness on 2/7, mri brain repeated, results showed: While several of these foci appear to be of slightly different ages, these are new relative to prior MRI from 01/14/2015, where the previously seen infarcts have resolved/normalized. No significant mass effect or parenchymal hemorrhage.  -continue IV antibiotics and surgery to fix his MV 2/19  5. Dental caries. Multiple teeth removed one week ago prior to this admission. -Still requiring prn pain meds and unable to chew -will continue dysphagia 3 diet  6. DM type II. Last A1c was above 8.  -will adjust as needed -continue SSI, novolog meal coverage and Levemir  7. Essential hypertension.  -bp better controlled -Will continue clonidine, lasix, hydralazine, norvasc, coreg and Imdur -continue low sodium diet encourage   8.Acute on Chronic kidney disease stage IV. Baseline creatinine around 2-2.6.  -Cr at 3.9 on (2/14) and now down to 2.19 on 2/18;  -diuretics dose adjusted -renal service consulted  9. Anemia of chronic disease: Hemoglobin was slightly drifting down most likely due to multiple consecutive phlebotomies. No signs of active bleeding. -In anticipation for surgery received 1 unit of PRBCs on 2/11 -Hgb remains stable at 8.4 -will follow trend and if needed transfuse another unit of PRBC; plan is to keep Hgb as close to 9 as possible.   Code Status: full  Family Communication: patient  And wife at bedside  Disposition Plan: remain inpatient; surgery 2/19(Dr. Cornelius Moraswen )   Consultants:  Cardiology  Cardiothoracic surgery  Infectious disease  Orthopedic surgery  Renal service  Procedures: 2/6 Right shoulder joint ARTHROSCOPY SHOULDER  Extensive debridement of the glenohumeral joint. Subacromial decompression.  Antibiotics:  ampicillin   Objective: BP 156/81 mmHg   Pulse 85  Temp(Src) 98.4 F (36.9 C) (Oral)  Resp 18  Ht  (1.88 m)  Wt 114.669 kg (252 lb 12.8 oz)  BMI 32.44 kg/m2  SpO2 97%  Intake/Output Summary (Last 24 hours) at 02/17/15 1200 Last data filed at 02/17/15 1000  Gross per 24 hour  Intake   1010 ml  Output   3875 ml  Net  -2865 ml   Filed Weights   02/14/15 0400 02/15/15 0500 02/17/15 0500  Weight: 122.199 kg (269 lb 6.4 oz) 114.7 kg (252 lb 13.9 oz) 114.669 kg (252 lb 12.8 oz)    Exam:   General:  AAOx3, NAD; denies CP.   Cardiovascular: RRR, soft systolic murmur appreciated on exam, no gallops; no JVD  Respiratory: good air movement; no crackles; no wheezing and no rhonchi  Abdomen: soft/NT/ND, positive bowel sounds  Musculoskeletal: less pitting edema bilateral, right shoulder dressing intact/decrease range of motion and pain with movement.  Data Reviewed: Basic Metabolic Panel:  Recent Labs Lab 02/13/15 0445 02/14/15 0415 02/14/15 0735 02/15/15 0330 02/16/15 0405 02/17/15 0455  NA 136  --  140 138 139 138  K 4.4  --  3.8 3.9 3.7 3.5  CL 98  --  101 102 104 104  CO2 33*  --  32 24 32 29  GLUCOSE 120*  --  141* 229* 190* 157*  BUN 48*  --  45* 42* 38* 31*  CREATININE 3.93*  --  3.55* 3.15* 2.60* 2.19*  CALCIUM 8.3*  --  8.3* 8.6 8.7 8.6  MG 2.5 2.5  --  2.4 2.1 2.0  PHOS  --   --   --  6.0* 5.5* 4.9*   CBC:  Recent Labs Lab 02/10/15 1945 02/11/15 0408 02/13/15 0445 02/14/15 0735  WBC 5.4 4.9 4.5 4.3  HGB 8.7* 8.6* 8.5* 8.4*  HCT 26.7* 26.1* 25.8* 25.5*  MCV 88.1 85.9 86.9 85.0  PLT 294 275 288 283   BNP (last 3 results)  Recent Labs  01/22/15 0347 02/02/15 1035 02/11/15 0510  BNP 218.9* 629.0* 182.2*   CBG:  Recent Labs Lab 02/16/15 0741 02/16/15 1148 02/16/15 1634 02/16/15 2043 02/17/15 0755  GLUCAP 125* 184* 232* 215* 125*    No results found for this or any previous visit (from the past 240 hour(s)).   Studies: No results found.  Scheduled Meds: . amLODipine   5 mg Oral Daily  . ampicillin (OMNIPEN) IV  2 g Intravenous 6 times per day  . carvedilol  50 mg Oral BID WC  . cloNIDine  0.2 mg Oral BID  . darbepoetin (ARANESP) injection - NON-DIALYSIS  60 mcg Subcutaneous Q Tue-1800  . docusate sodium  100 mg Oral BID  . feeding supplement (ENSURE COMPLETE)  237 mL Oral TID WC  . furosemide  40 mg Intravenous BID  . hydrALAZINE  50 mg Oral 3 times per day  . Influenza vac split quadrivalent PF  0.5 mL Intramuscular Tomorrow-1000  . insulin aspart  0-9 Units Subcutaneous TID WC  . insulin aspart  4 Units Subcutaneous TID WC  . insulin detemir  10 Units Subcutaneous QHS  . isosorbide mononitrate  90 mg Oral BID  . polyethylene glycol  17 g Oral BID  . sodium chloride  3 mL Intravenous Q12H    Continuous Infusions: none      Mandalyn Pasqua  [facility] Pager  [phone]. If 7PM-7AM, please contact night-coverage at [email or web address], password [facility] 02/17/2015, 12:00 PM  LOS: 15 days

## 2015-02-17 NOTE — Progress Notes (Signed)
Subjective:  Tired this AM- great UOP on minimal lasix- BP still good with modifications of BP meds, creatinine trending down nicely Objective Vital signs in last 24 hours: Filed Vitals:   02/16/15 1631 02/16/15 2045 02/17/15 0037 02/17/15 0500  BP: 156/83 150/87 135/72 152/79  Pulse: 76 84 87 83  Temp: 98 F (36.7 C) 98.7 F (37.1 C) 98.9 F (37.2 C) 98.9 F (37.2 C)  TempSrc: Oral Oral Oral Oral  Resp: 16 16 16 16   Height:      Weight:    114.669 kg (252 lb 12.8 oz)  SpO2: 100% 99% 100% 93%   Weight change:   Intake/Output Summary (Last 24 hours) at 02/17/15 [id number] Last data filed at 02/17/15 [id number]  Gross per 24 hour  Intake    890 ml  Output   2550 ml  Net  -1660 ml    Assessment/Plan: 35 year old black male with history of poorly controlled hypertension and diabetes - diagnosed last month with MSSA bacteremia and 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 He also has some CKD with baseline creatinine in the high ones up to 2 with recent acute on chronic renal failure brought on by diuresis and likely decreased renal profusion.  1.Renal- I suspect that he likely has some baseline renal insufficiency and CKD on the basis of his DM and HTN.  I suspect that his most recent acute on chronic renal failure is a result of relative hypotension and decreased renal perfusion. It is true that this was in the setting of diuresis but I don't believe that he was over diuresed per se. Fortunately, his renal function appears to be heading in the right direction at this time and he is diuresing. U/A is clean.  I do not detect any obvious nephrotoxins. I would like to see his creatinine closer to 2 before he's actually taken to the OR to lessen his risk of postoperative acute on chronic renal failure. I think that tomorrow is a good day to aim for and have discussed this with Dr. Cornelius Moraswen.  We will never be able to take his kidney risk to zero and patient  understands that.  We just have to hope that they will continue to be resilient 2. Hypertension/volume - he has massive edema. Not sure how much this has to do with his mitral regurg, he has hypoalbuminemia which is going to make him more prone to edema and is going to be difficult fix at this juncture.  He is on a supplement.  So far, diuresing and blood pressure is reasonable, no changes to meds today because still have some blood pressures in the 120's and 130's 3. Anemia - is not helping his volume status either, have added ESA to help boost- may need transfusion before all is said and done   Siyon Linck A    Labs: Basic Metabolic Panel:  Recent Labs Lab 02/15/15 0330 02/16/15 0405 02/17/15 0455  NA 138 139 138  K 3.9 3.7 3.5  CL 102 104 104  CO2 24 32 29  GLUCOSE 229* 190* 157*  BUN 42* 38* 31*  CREATININE 3.15* 2.60* 2.19*  CALCIUM 8.6 8.7 8.6  PHOS 6.0* 5.5* 4.9*   Liver Function Tests:  Recent Labs Lab 02/15/15 0330 02/16/15 0405 02/17/15 0455  ALBUMIN 1.9* 1.9* 1.9*   No results for input(s): LIPASE, AMYLASE in the last 168 hours. No results for input(s): AMMONIA in the last 168 hours. CBC:  Recent Labs Lab  02/10/15 1945 02/11/15 0408 02/13/15 0445 02/14/15 0735  WBC 5.4 4.9 4.5 4.3  HGB 8.7* 8.6* 8.5* 8.4*  HCT 26.7* 26.1* 25.8* 25.5*  MCV 88.1 85.9 86.9 85.0  PLT 294 275 288 283   Cardiac Enzymes: No results for input(s): CKTOTAL, CKMB, CKMBINDEX, TROPONINI in the last 168 hours. CBG:  Recent Labs Lab 02/16/15 0741 02/16/15 1148 02/16/15 1634 02/16/15 2043 02/17/15 0755  GLUCAP 125* 184* 232* 215* 125*    Iron Studies: No results for input(s): IRON, TIBC, TRANSFERRIN, FERRITIN in the last 72 hours. Studies/Results: No results found. Medications: Infusions: . sodium chloride 10 mL/hr at 02/14/15 1402    Scheduled Medications: . amLODipine  5 mg Oral Daily  . ampicillin (OMNIPEN) IV  2 g Intravenous 6 times per day  .  carvedilol  50 mg Oral BID WC  . cloNIDine  0.2 mg Oral BID  . darbepoetin (ARANESP) injection - NON-DIALYSIS  60 mcg Subcutaneous Q Tue-1800  . docusate sodium  100 mg Oral BID  . feeding supplement (ENSURE COMPLETE)  237 mL Oral TID WC  . furosemide  40 mg Intravenous BID  . hydrALAZINE  50 mg Oral 3 times per day  . Influenza vac split quadrivalent PF  0.5 mL Intramuscular Tomorrow-1000  . insulin aspart  0-9 Units Subcutaneous TID WC  . insulin aspart  4 Units Subcutaneous TID WC  . insulin detemir  10 Units Subcutaneous QHS  . isosorbide mononitrate  90 mg Oral BID  . polyethylene glycol  17 g Oral BID  . sodium chloride  3 mL Intravenous Q12H    have reviewed scheduled and prn medications.  Physical Exam: General: sitting up on side of bed- very flat affect Heart: RRR Lungs: mostly clear Abdomen: soft, non tender Extremities: pitting edema- but better    02/17/2015,8:28 AM  LOS: 15 days

## 2015-02-18 ENCOUNTER — Encounter (HOSPITAL_COMMUNITY)
Admission: EM | Disposition: A | Discharge status: Home Health | Payer: Self-pay | Source: Home / Self Care | Attending: Internal Medicine

## 2015-02-18 ENCOUNTER — Inpatient Hospital Stay (HOSPITAL_COMMUNITY): Payer: Medicaid Other | Admitting: Anesthesiology

## 2015-02-18 ENCOUNTER — Encounter (HOSPITAL_COMMUNITY): Payer: Self-pay | Admitting: Critical Care Medicine

## 2015-02-18 ENCOUNTER — Inpatient Hospital Stay (HOSPITAL_COMMUNITY): Payer: Medicaid Other

## 2015-02-18 DIAGNOSIS — Z9889 Other specified postprocedural states: Secondary | ICD-10-CM

## 2015-02-18 HISTORY — PX: TEE WITHOUT CARDIOVERSION: SHX5443

## 2015-02-18 HISTORY — PX: MITRAL VALVE REPLACEMENT: SHX147

## 2015-02-18 HISTORY — DX: Other specified postprocedural states: Z98.890

## 2015-02-18 LAB — POCT I-STAT 3, ART BLOOD GAS (G3+)
ACID-BASE EXCESS: 3 mmol/L — AB (ref 0.0–2.0)
Acid-base deficit: 2 mmol/L (ref 0.0–2.0)
Acid-base deficit: 2 mmol/L (ref 0.0–2.0)
BICARBONATE: 28.4 meq/L — AB (ref 20.0–24.0)
Bicarbonate: 24 mEq/L (ref 20.0–24.0)
Bicarbonate: 23.3 mEq/L (ref 20.0–24.0)
O2 Saturation: 100 %
O2 Saturation: 92 %
O2 Saturation: 99 %
PCO2 ART: 43.2 mmHg (ref 35.0–45.0)
PH ART: 7.347 — AB (ref 7.350–7.450)
PO2 ART: 64 mmHg — AB (ref 80.0–100.0)
PO2 ART: 136 mmHg — AB (ref 80.0–100.0)
Patient temperature: 35.7
Patient temperature: 97.6
TCO2: 30 mmol/L (ref 0–100)
TCO2: 25 mmol/L (ref 0–100)
TCO2: 25 mmol/L (ref 0–100)
pCO2 arterial: 47.9 mmHg — ABNORMAL HIGH (ref 35.0–45.0)
pCO2 arterial: 41.5 mmHg (ref 35.0–45.0)
pH, Arterial: 7.381 (ref 7.350–7.450)
pH, Arterial: 7.355 (ref 7.350–7.450)
pO2, Arterial: 407 mmHg — ABNORMAL HIGH (ref 80.0–100.0)

## 2015-02-18 LAB — CBC
HCT: 31.6 % — ABNORMAL LOW (ref 39.0–52.0)
HCT: 34.1 % — ABNORMAL LOW (ref 39.0–52.0)
HCT: 31.2 % — ABNORMAL LOW (ref 39.0–52.0)
HEMOGLOBIN: 10.5 g/dL — AB (ref 13.0–17.0)
Hemoglobin: 10.2 g/dL — ABNORMAL LOW (ref 13.0–17.0)
Hemoglobin: 11.4 g/dL — ABNORMAL LOW (ref 13.0–17.0)
MCH: 27.8 pg (ref 26.0–34.0)
MCH: 28.8 pg (ref 26.0–34.0)
MCH: 28.2 pg (ref 26.0–34.0)
MCHC: 32.3 g/dL (ref 30.0–36.0)
MCHC: 33.4 g/dL (ref 30.0–36.0)
MCHC: 33.7 g/dL (ref 30.0–36.0)
MCV: 86.1 fL (ref 78.0–100.0)
MCV: 86.1 fL (ref 78.0–100.0)
MCV: 83.6 fL (ref 78.0–100.0)
PLATELETS: 340 10*3/uL (ref 150–400)
Platelets: 169 10*3/uL (ref 150–400)
Platelets: 183 10*3/uL (ref 150–400)
RBC: 3.67 MIL/uL — ABNORMAL LOW (ref 4.22–5.81)
RBC: 3.96 MIL/uL — ABNORMAL LOW (ref 4.22–5.81)
RBC: 3.73 MIL/uL — AB (ref 4.22–5.81)
RDW: 13.8 % (ref 11.5–15.5)
RDW: 14.1 % (ref 11.5–15.5)
RDW: 14.2 % (ref 11.5–15.5)
WBC: 5.8 10*3/uL (ref 4.0–10.5)
WBC: 10.6 10*3/uL — ABNORMAL HIGH (ref 4.0–10.5)
WBC: 12 10*3/uL — ABNORMAL HIGH (ref 4.0–10.5)

## 2015-02-18 LAB — POCT I-STAT, CHEM 8
BUN: 26 mg/dL — AB (ref 6–23)
BUN: 25 mg/dL — ABNORMAL HIGH (ref 6–23)
BUN: 24 mg/dL — ABNORMAL HIGH (ref 6–23)
BUN: 23 mg/dL (ref 6–23)
BUN: 24 mg/dL — ABNORMAL HIGH (ref 6–23)
BUN: 24 mg/dL — ABNORMAL HIGH (ref 6–23)
BUN: 28 mg/dL — ABNORMAL HIGH (ref 6–23)
CALCIUM ION: 1.28 mmol/L — AB (ref 1.12–1.23)
CALCIUM ION: 1.26 mmol/L — AB (ref 1.12–1.23)
CALCIUM ION: 1.13 mmol/L (ref 1.12–1.23)
CALCIUM ION: 1.17 mmol/L (ref 1.12–1.23)
CALCIUM ION: 1.19 mmol/L (ref 1.12–1.23)
CHLORIDE: 103 mmol/L (ref 96–112)
CHLORIDE: 102 mmol/L (ref 96–112)
CREATININE: 1.8 mg/dL — AB (ref 0.50–1.35)
Calcium, Ion: 1.09 mmol/L — ABNORMAL LOW (ref 1.12–1.23)
Calcium, Ion: 1.15 mmol/L (ref 1.12–1.23)
Chloride: 101 mmol/L (ref 96–112)
Chloride: 105 mmol/L (ref 96–112)
Chloride: 106 mmol/L (ref 96–112)
Chloride: 104 mmol/L (ref 96–112)
Chloride: 108 mmol/L (ref 96–112)
Creatinine, Ser: 1.9 mg/dL — ABNORMAL HIGH (ref 0.50–1.35)
Creatinine, Ser: 1.7 mg/dL — ABNORMAL HIGH (ref 0.50–1.35)
Creatinine, Ser: 1.7 mg/dL — ABNORMAL HIGH (ref 0.50–1.35)
Creatinine, Ser: 1.7 mg/dL — ABNORMAL HIGH (ref 0.50–1.35)
Creatinine, Ser: 1.8 mg/dL — ABNORMAL HIGH (ref 0.50–1.35)
Creatinine, Ser: 1.9 mg/dL — ABNORMAL HIGH (ref 0.50–1.35)
GLUCOSE: 154 mg/dL — AB (ref 70–99)
GLUCOSE: 117 mg/dL — AB (ref 70–99)
GLUCOSE: 99 mg/dL (ref 70–99)
GLUCOSE: 138 mg/dL — AB (ref 70–99)
Glucose, Bld: 117 mg/dL — ABNORMAL HIGH (ref 70–99)
Glucose, Bld: 108 mg/dL — ABNORMAL HIGH (ref 70–99)
Glucose, Bld: 160 mg/dL — ABNORMAL HIGH (ref 70–99)
HCT: 26 % — ABNORMAL LOW (ref 39.0–52.0)
HCT: 24 % — ABNORMAL LOW (ref 39.0–52.0)
HCT: 25 % — ABNORMAL LOW (ref 39.0–52.0)
HCT: 24 % — ABNORMAL LOW (ref 39.0–52.0)
HCT: 26 % — ABNORMAL LOW (ref 39.0–52.0)
HEMATOCRIT: 21 % — AB (ref 39.0–52.0)
HEMATOCRIT: 31 % — AB (ref 39.0–52.0)
HEMOGLOBIN: 7.1 g/dL — AB (ref 13.0–17.0)
HEMOGLOBIN: 8.8 g/dL — AB (ref 13.0–17.0)
HEMOGLOBIN: 10.5 g/dL — AB (ref 13.0–17.0)
Hemoglobin: 8.8 g/dL — ABNORMAL LOW (ref 13.0–17.0)
Hemoglobin: 8.2 g/dL — ABNORMAL LOW (ref 13.0–17.0)
Hemoglobin: 8.5 g/dL — ABNORMAL LOW (ref 13.0–17.0)
Hemoglobin: 8.2 g/dL — ABNORMAL LOW (ref 13.0–17.0)
POTASSIUM: 3.7 mmol/L (ref 3.5–5.1)
POTASSIUM: 4 mmol/L (ref 3.5–5.1)
POTASSIUM: 4.4 mmol/L (ref 3.5–5.1)
Potassium: 3.6 mmol/L (ref 3.5–5.1)
Potassium: 3.9 mmol/L (ref 3.5–5.1)
Potassium: 4.3 mmol/L (ref 3.5–5.1)
Potassium: 4.4 mmol/L (ref 3.5–5.1)
SODIUM: 141 mmol/L (ref 135–145)
SODIUM: 140 mmol/L (ref 135–145)
Sodium: 140 mmol/L (ref 135–145)
Sodium: 138 mmol/L (ref 135–145)
Sodium: 140 mmol/L (ref 135–145)
Sodium: 140 mmol/L (ref 135–145)
Sodium: 140 mmol/L (ref 135–145)
TCO2: 25 mmol/L (ref 0–100)
TCO2: 24 mmol/L (ref 0–100)
TCO2: 23 mmol/L (ref 0–100)
TCO2: 22 mmol/L (ref 0–100)
TCO2: 22 mmol/L (ref 0–100)
TCO2: 23 mmol/L (ref 0–100)
TCO2: 20 mmol/L (ref 0–100)

## 2015-02-18 LAB — MAGNESIUM
Magnesium: 2.1 mg/dL (ref 1.5–2.5)
Magnesium: 2.7 mg/dL — ABNORMAL HIGH (ref 1.5–2.5)

## 2015-02-18 LAB — BLOOD GAS, ARTERIAL
Acid-Base Excess: 3.7 mmol/L — ABNORMAL HIGH (ref 0.0–2.0)
Bicarbonate: 27.8 mEq/L — ABNORMAL HIGH (ref 20.0–24.0)
DRAWN BY: 36496
FIO2: 0.21 %
O2 Saturation: 93 %
PATIENT TEMPERATURE: 98.6
PCO2 ART: 42.5 mmHg (ref 35.0–45.0)
TCO2: 29.1 mmol/L (ref 0–100)
pH, Arterial: 7.431 (ref 7.350–7.450)
pO2, Arterial: 65.9 mmHg — ABNORMAL LOW (ref 80.0–100.0)

## 2015-02-18 LAB — POCT I-STAT 4, (NA,K, GLUC, HGB,HCT)
GLUCOSE: 103 mg/dL — AB (ref 70–99)
HCT: 34 % — ABNORMAL LOW (ref 39.0–52.0)
Hemoglobin: 11.6 g/dL — ABNORMAL LOW (ref 13.0–17.0)
Potassium: 4.4 mmol/L (ref 3.5–5.1)
Sodium: 141 mmol/L (ref 135–145)

## 2015-02-18 LAB — BASIC METABOLIC PANEL
ANION GAP: 6 (ref 5–15)
BUN: 27 mg/dL — ABNORMAL HIGH (ref 6–23)
CO2: 31 mmol/L (ref 19–32)
CREATININE: 2.15 mg/dL — AB (ref 0.50–1.35)
Calcium: 9 mg/dL (ref 8.4–10.5)
Chloride: 103 mmol/L (ref 96–112)
GFR calc non Af Amer: 38 mL/min — ABNORMAL LOW (ref >90)
GFR, EST AFRICAN AMERICAN: 44 mL/min — AB (ref >90)
GLUCOSE: 187 mg/dL — AB (ref 70–99)
Potassium: 3.7 mmol/L (ref 3.5–5.1)
Sodium: 140 mmol/L (ref 135–145)

## 2015-02-18 LAB — HEMOGLOBIN AND HEMATOCRIT, BLOOD
HCT: 23.1 % — ABNORMAL LOW (ref 39.0–52.0)
Hemoglobin: 7.7 g/dL — ABNORMAL LOW (ref 13.0–17.0)

## 2015-02-18 LAB — GRAM STAIN

## 2015-02-18 LAB — URINALYSIS, ROUTINE W REFLEX MICROSCOPIC
Bilirubin Urine: NEGATIVE
GLUCOSE, UA: 100 mg/dL — AB
Hgb urine dipstick: NEGATIVE
Ketones, ur: NEGATIVE mg/dL
LEUKOCYTES UA: NEGATIVE
Nitrite: NEGATIVE
PH: 7.5 (ref 5.0–8.0)
Protein, ur: 100 mg/dL — AB
Specific Gravity, Urine: 1.013 (ref 1.005–1.030)
Urobilinogen, UA: 0.2 mg/dL (ref 0.0–1.0)

## 2015-02-18 LAB — GLUCOSE, CAPILLARY
GLUCOSE-CAPILLARY: 74 mg/dL (ref 70–99)
Glucose-Capillary: 95 mg/dL (ref 70–99)

## 2015-02-18 LAB — CREATININE, SERUM
CREATININE: 2.06 mg/dL — AB (ref 0.50–1.35)
GFR calc Af Amer: 47 mL/min — ABNORMAL LOW (ref >90)
GFR, EST NON AFRICAN AMERICAN: 40 mL/min — AB (ref >90)

## 2015-02-18 LAB — URINE MICROSCOPIC-ADD ON

## 2015-02-18 LAB — PREPARE RBC (CROSSMATCH)

## 2015-02-18 LAB — PROTIME-INR
INR: 1.32 (ref 0.00–1.49)
Prothrombin Time: 16.5 seconds — ABNORMAL HIGH (ref 11.6–15.2)

## 2015-02-18 LAB — PLATELET COUNT: Platelets: 170 10*3/uL (ref 150–400)

## 2015-02-18 LAB — APTT: aPTT: 32 seconds (ref 24–37)

## 2015-02-18 SURGERY — REPLACEMENT, MITRAL VALVE, MINIMALLY INVASIVE
Anesthesia: General | Site: Chest | Laterality: Right

## 2015-02-18 MED ORDER — METOPROLOL TARTRATE 12.5 MG HALF TABLET
12.5000 mg | ORAL_TABLET | Freq: Two times a day (BID) | ORAL | Status: DC
  Administered 2015-02-19: 12.5 mg via ORAL
  Filled 2015-02-18 (×3): qty 1

## 2015-02-18 MED ORDER — LACTATED RINGERS IV SOLN
INTRAVENOUS | Status: DC | PRN
  Administered 2015-02-18 (×2): via INTRAVENOUS

## 2015-02-18 MED ORDER — NITROGLYCERIN IN D5W 200-5 MCG/ML-% IV SOLN
0.0000 ug/min | INTRAVENOUS | Status: DC
  Administered 2015-02-19: 80 ug/min via INTRAVENOUS
  Administered 2015-02-20: 90 ug/min via INTRAVENOUS
  Filled 2015-02-18 (×3): qty 250

## 2015-02-18 MED ORDER — HEPARIN SODIUM (PORCINE) 1000 UNIT/ML IJ SOLN
INTRAMUSCULAR | Status: AC
  Filled 2015-02-18: qty 1

## 2015-02-18 MED ORDER — SODIUM CHLORIDE 0.9 % IV SOLN
INTRAVENOUS | Status: DC | PRN
  Administered 2015-02-18: 15:00:00 via INTRAVENOUS

## 2015-02-18 MED ORDER — SODIUM CHLORIDE 0.9 % IJ SOLN
INTRAMUSCULAR | Status: AC
  Filled 2015-02-18: qty 20

## 2015-02-18 MED ORDER — GLUTARALDEHYDE 0.625% SOAKING SOLUTION
TOPICAL | Status: DC | PRN
  Administered 2015-02-18: 1 via TOPICAL
  Filled 2015-02-18: qty 50

## 2015-02-18 MED ORDER — MIDAZOLAM HCL 2 MG/2ML IJ SOLN
2.0000 mg | INTRAMUSCULAR | Status: DC | PRN
  Administered 2015-02-18: 2 mg via INTRAVENOUS
  Filled 2015-02-18: qty 2

## 2015-02-18 MED ORDER — ROCURONIUM BROMIDE 50 MG/5ML IV SOLN
INTRAVENOUS | Status: AC
  Filled 2015-02-18: qty 2

## 2015-02-18 MED ORDER — LIDOCAINE HCL (CARDIAC) 20 MG/ML IV SOLN
INTRAVENOUS | Status: AC
  Filled 2015-02-18: qty 5

## 2015-02-18 MED ORDER — FENTANYL CITRATE 0.05 MG/ML IJ SOLN
INTRAMUSCULAR | Status: AC
  Filled 2015-02-18: qty 5

## 2015-02-18 MED ORDER — BISACODYL 5 MG PO TBEC
10.0000 mg | DELAYED_RELEASE_TABLET | Freq: Every day | ORAL | Status: DC
Start: 2015-02-19 — End: 2015-02-25
  Administered 2015-02-19 – 2015-02-22 (×4): 10 mg via ORAL
  Filled 2015-02-18 (×4): qty 2

## 2015-02-18 MED ORDER — ARTIFICIAL TEARS OP OINT
TOPICAL_OINTMENT | OPHTHALMIC | Status: DC | PRN
  Administered 2015-02-18: 1 via OPHTHALMIC

## 2015-02-18 MED ORDER — ACETAMINOPHEN 160 MG/5ML PO SOLN: 650.0000 mg | Freq: Once | ORAL | Status: AC

## 2015-02-18 MED ORDER — SODIUM CHLORIDE 0.9 % IR SOLN
Status: DC | PRN
  Administered 2015-02-18: 500 mL

## 2015-02-18 MED ORDER — FUROSEMIDE 10 MG/ML IJ SOLN
40.0000 mg | Freq: Once | INTRAMUSCULAR | Status: AC
  Administered 2015-02-18: 40 mg via INTRAVENOUS

## 2015-02-18 MED ORDER — PHENYLEPHRINE HCL 10 MG/ML IJ SOLN
0.0000 ug/min | INTRAVENOUS | Status: DC
  Filled 2015-02-18: qty 2

## 2015-02-18 MED ORDER — MIDAZOLAM HCL 5 MG/5ML IJ SOLN
INTRAMUSCULAR | Status: DC | PRN
  Administered 2015-02-18: 1 mg via INTRAVENOUS
  Administered 2015-02-18: 2 mg via INTRAVENOUS
  Administered 2015-02-18: 3 mg via INTRAVENOUS
  Administered 2015-02-18 (×2): 2 mg via INTRAVENOUS
  Administered 2015-02-18 (×2): 1 mg via INTRAVENOUS
  Administered 2015-02-18: 2 mg via INTRAVENOUS
  Administered 2015-02-18: 1 mg via INTRAVENOUS
  Administered 2015-02-18: 5 mg via INTRAVENOUS
  Administered 2015-02-18: 2 mg via INTRAVENOUS

## 2015-02-18 MED ORDER — ACETAMINOPHEN 160 MG/5ML PO SOLN
1000.0000 mg | Freq: Four times a day (QID) | ORAL | Status: DC
  Administered 2015-02-19: 1000 mg
  Filled 2015-02-18: qty 40.6

## 2015-02-18 MED ORDER — LACTATED RINGERS IV SOLN
INTRAVENOUS | Status: DC | PRN
  Administered 2015-02-18: 08:00:00 via INTRAVENOUS

## 2015-02-18 MED ORDER — LACTATED RINGERS IV SOLN
INTRAVENOUS | Status: DC | PRN
  Administered 2015-02-18: 07:00:00 via INTRAVENOUS

## 2015-02-18 MED ORDER — ONDANSETRON HCL 4 MG/2ML IJ SOLN
4.0000 mg | Freq: Four times a day (QID) | INTRAMUSCULAR | Status: DC | PRN
  Administered 2015-02-19 – 2015-02-20 (×5): 4 mg via INTRAVENOUS
  Filled 2015-02-18 (×5): qty 2

## 2015-02-18 MED ORDER — PROTAMINE SULFATE 10 MG/ML IV SOLN
INTRAVENOUS | Status: DC | PRN
  Administered 2015-02-18: 300 mg via INTRAVENOUS

## 2015-02-18 MED ORDER — SUCCINYLCHOLINE CHLORIDE 20 MG/ML IJ SOLN
INTRAMUSCULAR | Status: AC
  Filled 2015-02-18: qty 1

## 2015-02-18 MED ORDER — ARTIFICIAL TEARS OP OINT
TOPICAL_OINTMENT | OPHTHALMIC | Status: AC
  Filled 2015-02-18: qty 3.5

## 2015-02-18 MED ORDER — BISACODYL 10 MG RE SUPP
10.0000 mg | Freq: Every day | RECTAL | Status: DC
Start: 2015-02-19 — End: 2015-02-25

## 2015-02-18 MED ORDER — METOPROLOL TARTRATE 25 MG/10 ML ORAL SUSPENSION
12.5000 mg | Freq: Two times a day (BID) | ORAL | Status: DC
  Administered 2015-02-18: 12.5 mg
  Filled 2015-02-18 (×3): qty 5

## 2015-02-18 MED ORDER — EPHEDRINE SULFATE 50 MG/ML IJ SOLN
INTRAMUSCULAR | Status: AC
  Filled 2015-02-18: qty 1

## 2015-02-18 MED ORDER — POTASSIUM CHLORIDE 10 MEQ/50ML IV SOLN: 10.0000 meq | INTRAVENOUS | Status: AC

## 2015-02-18 MED ORDER — PROTAMINE SULFATE 10 MG/ML IV SOLN
INTRAVENOUS | Status: AC
  Filled 2015-02-18: qty 5

## 2015-02-18 MED ORDER — SODIUM CHLORIDE 0.9 % IV SOLN
INTRAVENOUS | Status: DC
  Administered 2015-02-18: 17:00:00 via INTRAVENOUS

## 2015-02-18 MED ORDER — SODIUM CHLORIDE 0.9 % IJ SOLN
INTRAMUSCULAR | Status: AC
Start: 2015-02-18 — End: 2015-02-18
  Filled 2015-02-18: qty 10

## 2015-02-18 MED ORDER — SODIUM CHLORIDE 0.9 % IV SOLN
250.0000 [IU] | INTRAVENOUS | Status: DC | PRN
  Administered 2015-02-18: 1 [IU]/h via INTRAVENOUS

## 2015-02-18 MED ORDER — FAMOTIDINE IN NACL 20-0.9 MG/50ML-% IV SOLN
20.0000 mg | Freq: Two times a day (BID) | INTRAVENOUS | Status: AC
  Administered 2015-02-18 – 2015-02-19 (×2): 20 mg via INTRAVENOUS
  Filled 2015-02-18: qty 50

## 2015-02-18 MED ORDER — LACTATED RINGERS IV SOLN: INTRAVENOUS | Status: DC

## 2015-02-18 MED ORDER — 0.9 % SODIUM CHLORIDE (POUR BTL) OPTIME
TOPICAL | Status: DC | PRN
  Administered 2015-02-18: 5000 mL

## 2015-02-18 MED ORDER — MIDAZOLAM HCL 10 MG/2ML IJ SOLN
INTRAMUSCULAR | Status: AC
  Filled 2015-02-18: qty 2

## 2015-02-18 MED ORDER — MORPHINE SULFATE 2 MG/ML IJ SOLN
2.0000 mg | INTRAMUSCULAR | Status: DC | PRN
  Administered 2015-02-18: 2 mg via INTRAVENOUS
  Administered 2015-02-19 (×2): 4 mg via INTRAVENOUS
  Administered 2015-02-19 (×3): 2 mg via INTRAVENOUS
  Administered 2015-02-19: 4 mg via INTRAVENOUS
  Administered 2015-02-19 (×3): 2 mg via INTRAVENOUS
  Administered 2015-02-19: 4 mg via INTRAVENOUS
  Administered 2015-02-19: 2 mg via INTRAVENOUS
  Administered 2015-02-20 (×6): 4 mg via INTRAVENOUS
  Filled 2015-02-18 (×4): qty 2
  Filled 2015-02-18: qty 1
  Filled 2015-02-18: qty 2
  Filled 2015-02-18 (×2): qty 1
  Filled 2015-02-18 (×4): qty 2
  Filled 2015-02-18: qty 1
  Filled 2015-02-18 (×2): qty 2
  Filled 2015-02-18 (×2): qty 1

## 2015-02-18 MED ORDER — ROCURONIUM BROMIDE 50 MG/5ML IV SOLN
INTRAVENOUS | Status: AC
  Filled 2015-02-18: qty 1

## 2015-02-18 MED ORDER — DEXMEDETOMIDINE HCL IN NACL 200 MCG/50ML IV SOLN
0.0000 ug/kg/h | INTRAVENOUS | Status: DC
  Filled 2015-02-18: qty 50

## 2015-02-18 MED ORDER — TRAMADOL HCL 50 MG PO TABS: 50.0000 mg | ORAL_TABLET | ORAL | Status: DC | PRN

## 2015-02-18 MED ORDER — PROPOFOL 10 MG/ML IV BOLUS
INTRAVENOUS | Status: AC
  Filled 2015-02-18: qty 20

## 2015-02-18 MED ORDER — SODIUM CHLORIDE 0.9 % IV SOLN
0.5000 g/h | INTRAVENOUS | Status: AC
  Administered 2015-02-18: 1 g/h via INTRAVENOUS
  Filled 2015-02-18: qty 20

## 2015-02-18 MED ORDER — SODIUM CHLORIDE 0.9 % IV SOLN
10.0000 g | INTRAVENOUS | Status: DC | PRN
  Administered 2015-02-18: 5 g/h via INTRAVENOUS

## 2015-02-18 MED ORDER — OXYCODONE HCL 5 MG PO TABS
5.0000 mg | ORAL_TABLET | ORAL | Status: DC | PRN
  Administered 2015-02-19: 5 mg via ORAL
  Administered 2015-02-19 – 2015-02-21 (×8): 10 mg via ORAL
  Administered 2015-02-21: 5 mg via ORAL
  Administered 2015-02-22 – 2015-02-25 (×10): 10 mg via ORAL
  Filled 2015-02-18: qty 1
  Filled 2015-02-18 (×8): qty 2
  Filled 2015-02-18: qty 1
  Filled 2015-02-18 (×11): qty 2

## 2015-02-18 MED ORDER — MIDAZOLAM HCL 2 MG/2ML IJ SOLN
INTRAMUSCULAR | Status: AC
  Filled 2015-02-18: qty 2

## 2015-02-18 MED ORDER — ROCURONIUM BROMIDE 100 MG/10ML IV SOLN
INTRAVENOUS | Status: DC | PRN
  Administered 2015-02-18 (×4): 50 mg via INTRAVENOUS
  Administered 2015-02-18: 20 mg via INTRAVENOUS
  Administered 2015-02-18: 50 mg via INTRAVENOUS
  Administered 2015-02-18: 30 mg via INTRAVENOUS
  Administered 2015-02-18: 20 mg via INTRAVENOUS

## 2015-02-18 MED ORDER — SODIUM CHLORIDE 0.9 % IJ SOLN
INTRAMUSCULAR | Status: AC
  Filled 2015-02-18: qty 10

## 2015-02-18 MED ORDER — SODIUM CHLORIDE 0.45 % IV SOLN
INTRAVENOUS | Status: DC
  Administered 2015-02-18: 17:00:00 via INTRAVENOUS

## 2015-02-18 MED ORDER — ASPIRIN EC 325 MG PO TBEC
325.0000 mg | DELAYED_RELEASE_TABLET | Freq: Every day | ORAL | Status: DC
  Administered 2015-02-19 – 2015-02-25 (×7): 325 mg via ORAL
  Filled 2015-02-18 (×7): qty 1

## 2015-02-18 MED ORDER — MAGNESIUM SULFATE 4 GM/100ML IV SOLN
4.0000 g | Freq: Once | INTRAVENOUS | Status: AC
  Administered 2015-02-18: 4 g via INTRAVENOUS
  Filled 2015-02-18: qty 100

## 2015-02-18 MED ORDER — LABETALOL HCL 5 MG/ML IV SOLN
10.0000 mg | INTRAVENOUS | Status: DC | PRN
  Administered 2015-02-19 – 2015-02-25 (×4): 10 mg via INTRAVENOUS
  Filled 2015-02-18 (×6): qty 4

## 2015-02-18 MED ORDER — ACETAMINOPHEN 500 MG PO TABS
1000.0000 mg | ORAL_TABLET | Freq: Four times a day (QID) | ORAL | Status: AC
  Administered 2015-02-19 – 2015-02-23 (×16): 1000 mg via ORAL
  Filled 2015-02-18 (×20): qty 2

## 2015-02-18 MED ORDER — SODIUM CHLORIDE 0.9 % IJ SOLN: 3.0000 mL | INTRAMUSCULAR | Status: DC | PRN

## 2015-02-18 MED ORDER — ALBUMIN HUMAN 5 % IV SOLN
250.0000 mL | INTRAVENOUS | Status: AC | PRN
  Administered 2015-02-18: 250 mL via INTRAVENOUS

## 2015-02-18 MED ORDER — VANCOMYCIN HCL IN DEXTROSE 1-5 GM/200ML-% IV SOLN
1000.0000 mg | Freq: Once | INTRAVENOUS | Status: AC
  Administered 2015-02-18: 1000 mg via INTRAVENOUS
  Filled 2015-02-18: qty 200

## 2015-02-18 MED ORDER — SODIUM CHLORIDE 0.9 % IV SOLN: Freq: Once | INTRAVENOUS | Status: DC

## 2015-02-18 MED ORDER — DOCUSATE SODIUM 100 MG PO CAPS
200.0000 mg | ORAL_CAPSULE | Freq: Every day | ORAL | Status: DC
Start: 2015-02-19 — End: 2015-02-25
  Administered 2015-02-19 – 2015-02-25 (×6): 200 mg via ORAL
  Filled 2015-02-18 (×7): qty 2

## 2015-02-18 MED ORDER — ASPIRIN 81 MG PO CHEW: 324.0000 mg | CHEWABLE_TABLET | Freq: Every day | ORAL | Status: DC

## 2015-02-18 MED ORDER — SODIUM CHLORIDE 0.9 % IJ SOLN
3.0000 mL | Freq: Two times a day (BID) | INTRAMUSCULAR | Status: DC
  Administered 2015-02-19 (×2): 3 mL via INTRAVENOUS

## 2015-02-18 MED ORDER — LACTATED RINGERS IV SOLN
INTRAVENOUS | Status: DC | PRN
  Administered 2015-02-18 (×2): via INTRAVENOUS

## 2015-02-18 MED ORDER — SODIUM CHLORIDE 0.9 % IV SOLN
INTRAVENOUS | Status: DC
  Administered 2015-02-18: 21:00:00 via INTRAVENOUS
  Filled 2015-02-18 (×2): qty 2.5

## 2015-02-18 MED ORDER — CETYLPYRIDINIUM CHLORIDE 0.05 % MT LIQD
7.0000 mL | Freq: Four times a day (QID) | OROMUCOSAL | Status: DC
  Administered 2015-02-18 – 2015-02-25 (×20): 7 mL via OROMUCOSAL

## 2015-02-18 MED ORDER — PROTAMINE SULFATE 10 MG/ML IV SOLN
INTRAVENOUS | Status: AC
  Filled 2015-02-18: qty 25

## 2015-02-18 MED ORDER — DEXTROSE 5 % IV SOLN
1.5000 g | Freq: Two times a day (BID) | INTRAVENOUS | Status: AC
  Administered 2015-02-18 – 2015-02-20 (×4): 1.5 g via INTRAVENOUS
  Filled 2015-02-18 (×4): qty 1.5

## 2015-02-18 MED ORDER — HEPARIN SODIUM (PORCINE) 1000 UNIT/ML IJ SOLN
INTRAMUSCULAR | Status: DC | PRN
Start: 2015-02-18 — End: 2015-02-18
  Administered 2015-02-18: 36000 [IU] via INTRAVENOUS

## 2015-02-18 MED ORDER — PROPOFOL 10 MG/ML IV BOLUS
INTRAVENOUS | Status: DC | PRN
  Administered 2015-02-18: 50 mg via INTRAVENOUS

## 2015-02-18 MED ORDER — MORPHINE SULFATE 2 MG/ML IJ SOLN: 1.0000 mg | INTRAMUSCULAR | Status: AC | PRN

## 2015-02-18 MED ORDER — CHLORHEXIDINE GLUCONATE 0.12 % MT SOLN
15.0000 mL | Freq: Two times a day (BID) | OROMUCOSAL | Status: DC
  Administered 2015-02-18 – 2015-02-20 (×5): 15 mL via OROMUCOSAL
  Filled 2015-02-18 (×4): qty 15

## 2015-02-18 MED ORDER — PANTOPRAZOLE SODIUM 40 MG PO TBEC
40.0000 mg | DELAYED_RELEASE_TABLET | Freq: Every day | ORAL | Status: DC
  Administered 2015-02-20 – 2015-02-25 (×6): 40 mg via ORAL
  Filled 2015-02-18 (×4): qty 1

## 2015-02-18 MED ORDER — METOPROLOL TARTRATE 1 MG/ML IV SOLN
2.5000 mg | INTRAVENOUS | Status: DC | PRN
  Administered 2015-02-19 (×3): 5 mg via INTRAVENOUS
  Filled 2015-02-18 (×3): qty 5

## 2015-02-18 MED ORDER — LABETALOL HCL 5 MG/ML IV SOLN
10.0000 mg | INTRAVENOUS | Status: DC | PRN
Start: 2015-02-18 — End: 2015-02-18

## 2015-02-18 MED ORDER — NITROGLYCERIN IN D5W 200-5 MCG/ML-% IV SOLN
INTRAVENOUS | Status: DC | PRN
  Administered 2015-02-18: 5 ug/min via INTRAVENOUS

## 2015-02-18 MED ORDER — ACETAMINOPHEN 650 MG RE SUPP
650.0000 mg | Freq: Once | RECTAL | Status: AC
  Administered 2015-02-18: 650 mg via RECTAL
  Filled 2015-02-18: qty 1

## 2015-02-18 MED ORDER — DEXMEDETOMIDINE HCL IN NACL 400 MCG/100ML IV SOLN
0.4000 ug/kg/h | INTRAVENOUS | Status: DC
  Filled 2015-02-18: qty 100

## 2015-02-18 MED ORDER — SODIUM CHLORIDE 0.9 % IV SOLN
250.0000 mL | INTRAVENOUS | Status: DC
  Administered 2015-02-23 – 2015-02-25 (×2): 250 mL via INTRAVENOUS

## 2015-02-18 MED ORDER — LACTATED RINGERS IV SOLN: 500.0000 mL | Freq: Once | INTRAVENOUS | Status: AC | PRN

## 2015-02-18 MED ORDER — INSULIN REGULAR BOLUS VIA INFUSION
0.0000 [IU] | Freq: Three times a day (TID) | INTRAVENOUS | Status: DC
  Filled 2015-02-18: qty 10

## 2015-02-18 MED ORDER — FENTANYL CITRATE 0.05 MG/ML IJ SOLN
INTRAMUSCULAR | Status: DC | PRN
  Administered 2015-02-18: 150 ug via INTRAVENOUS
  Administered 2015-02-18: 100 ug via INTRAVENOUS
  Administered 2015-02-18 (×2): 150 ug via INTRAVENOUS
  Administered 2015-02-18: 25 ug via INTRAVENOUS
  Administered 2015-02-18: 100 ug via INTRAVENOUS
  Administered 2015-02-18: 50 ug via INTRAVENOUS
  Administered 2015-02-18: 150 ug via INTRAVENOUS
  Administered 2015-02-18: 100 ug via INTRAVENOUS
  Administered 2015-02-18: 250 ug via INTRAVENOUS
  Administered 2015-02-18: 25 ug via INTRAVENOUS

## 2015-02-18 SURGICAL SUPPLY — 99 items
ADAPTER CARDIO PERF ANTE/RETRO (ADAPTER) ×3 IMPLANT
ATTRACTOMAT 16X20 MAGNETIC DRP (DRAPES) ×3 IMPLANT
BAG DECANTER FOR FLEXI CONT (MISCELLANEOUS) ×3 IMPLANT
BENZOIN TINCTURE PRP APPL 2/3 (GAUZE/BANDAGES/DRESSINGS) ×3 IMPLANT
BLADE SURG 11 STRL SS (BLADE) ×3 IMPLANT
CANISTER SUCTION 2500CC (MISCELLANEOUS) ×6 IMPLANT
CANNULA BIO-MED ART (CANNULA) ×3 IMPLANT
CANNULA FEM VENOUS REMOTE 22FR (CANNULA) ×3 IMPLANT
CANNULA FEMORAL ART 14 SM (MISCELLANEOUS) ×3 IMPLANT
CANNULA GUNDRY RCSP 15FR (MISCELLANEOUS) ×3 IMPLANT
CANNULA OPTISITE PERFUSION 16F (CANNULA) IMPLANT
CANNULA OPTISITE PERFUSION 18F (CANNULA) ×3 IMPLANT
CONN ST 1/4X3/8  BEN (MISCELLANEOUS) ×2
CONN ST 1/4X3/8 BEN (MISCELLANEOUS) ×4 IMPLANT
CONNECTOR 1/2X3/8X1/2 3 WAY (MISCELLANEOUS) ×1
CONNECTOR 1/2X3/8X1/2 3WAY (MISCELLANEOUS) ×2 IMPLANT
CONT SPEC 4OZ CLIKSEAL STRL BL (MISCELLANEOUS) ×9 IMPLANT
CONT SPEC STER OR (MISCELLANEOUS) ×3 IMPLANT
COR-KNOT ELITE COMBO KIT (Prosthesis & Implant Heart) ×3 IMPLANT
COVER BACK TABLE 24X17X13 BIG (DRAPES) ×3 IMPLANT
COVER MAYO STAND STRL (DRAPES) ×3 IMPLANT
COVER SURGICAL LIGHT HANDLE (MISCELLANEOUS) ×3 IMPLANT
CRADLE DONUT ADULT HEAD (MISCELLANEOUS) ×3 IMPLANT
DERMABOND ADVANCED (GAUZE/BANDAGES/DRESSINGS) ×3
DERMABOND ADVANCED .7 DNX12 (GAUZE/BANDAGES/DRESSINGS) ×6 IMPLANT
DEVICE SUT CK QUICK LOAD INDV (Prosthesis & Implant Heart) ×6 IMPLANT
DEVICE SUT CK QUICK LOAD MINI (Prosthesis & Implant Heart) ×9 IMPLANT
DEVICE TROCAR PUNCTURE CLOSURE (ENDOMECHANICALS) ×3 IMPLANT
DRAIN CHANNEL 28F RND 3/8 FF (WOUND CARE) ×6 IMPLANT
DRAPE BILATERAL SPLIT (DRAPES) ×3 IMPLANT
DRAPE C-ARM 42X72 X-RAY (DRAPES) ×3 IMPLANT
DRAPE CV SPLIT W-CLR ANES SCRN (DRAPES) ×3 IMPLANT
DRAPE INCISE IOBAN 66X45 STRL (DRAPES) ×6 IMPLANT
DRAPE SLUSH/WARMER DISC (DRAPES) ×3 IMPLANT
DRSG COVADERM 4X8 (GAUZE/BANDAGES/DRESSINGS) ×3 IMPLANT
ELECT BLADE 6.5 EXT (BLADE) ×3 IMPLANT
ELECT REM PT RETURN 9FT ADLT (ELECTROSURGICAL) ×6
ELECTRODE REM PT RTRN 9FT ADLT (ELECTROSURGICAL) ×4 IMPLANT
FEMORAL VENOUS CANN RAP (CANNULA) IMPLANT
GAUZE SPONGE 4X4 12PLY STRL (GAUZE/BANDAGES/DRESSINGS) ×3 IMPLANT
GLOVE BIO SURGEON STRL SZ 6.5 (GLOVE) ×9 IMPLANT
GLOVE BIOGEL PI IND STRL 7.0 (GLOVE) ×8 IMPLANT
GLOVE BIOGEL PI INDICATOR 7.0 (GLOVE) ×4
GLOVE ORTHO TXT STRL SZ7.5 (GLOVE) ×9 IMPLANT
GOWN STRL REUS W/ TWL LRG LVL3 (GOWN DISPOSABLE) ×8 IMPLANT
GOWN STRL REUS W/TWL LRG LVL3 (GOWN DISPOSABLE) ×4
GUIDEWIRE ANG ZIPWIRE 038X150 (WIRE) ×3 IMPLANT
INSERT CONFORM CROSS CLAMP 66M (MISCELLANEOUS) ×3 IMPLANT
INSERT CONFORM CROSS CLAMP 86M (MISCELLANEOUS) ×3 IMPLANT
KIT BASIN OR (CUSTOM PROCEDURE TRAY) ×3 IMPLANT
KIT DEVICE SUT COR-KNOT MIS 5 (INSTRUMENTS) ×3 IMPLANT
KIT DILATOR VASC 18G NDL (KITS) ×3 IMPLANT
KIT ROOM TURNOVER OR (KITS) ×3 IMPLANT
KIT SUCTION CATH 14FR (SUCTIONS) ×3 IMPLANT
LEAD PACING MYOCARDI (MISCELLANEOUS) ×3 IMPLANT
NEEDLE AORTIC ROOT 14G 7F (CATHETERS) ×3 IMPLANT
NS IRRIG 1000ML POUR BTL (IV SOLUTION) ×15 IMPLANT
PACK OPEN HEART (CUSTOM PROCEDURE TRAY) ×3 IMPLANT
PAD ARMBOARD 7.5X6 YLW CONV (MISCELLANEOUS) ×6 IMPLANT
PAD ELECT DEFIB RADIOL ZOLL (MISCELLANEOUS) ×3 IMPLANT
PATCH CORMATRIX 4CMX7CM (Prosthesis & Implant Heart) ×3 IMPLANT
PENCIL BUTTON HOLSTER BLD 10FT (ELECTRODE) ×3 IMPLANT
RETRACTOR TRL SOFT TISSUE LG (INSTRUMENTS) IMPLANT
RETRACTOR TRM SOFT TISSUE 7.5 (INSTRUMENTS) IMPLANT
RING MITRAL MEMO 3D 28MM SMD28 (Prosthesis & Implant Heart) ×3 IMPLANT
RUBBERBAND STERILE (MISCELLANEOUS) ×3 IMPLANT
SET CANNULATION TOURNIQUET (MISCELLANEOUS) ×3 IMPLANT
SET IRRIG TUBING LAPAROSCOPIC (IRRIGATION / IRRIGATOR) ×3 IMPLANT
SOLUTION ANTI FOG 6CC (MISCELLANEOUS) ×3 IMPLANT
SPONGE GAUZE 4X4 12PLY STER LF (GAUZE/BANDAGES/DRESSINGS) ×6 IMPLANT
STRIP PERIGUARD 4X4 (Vascular Products) ×3 IMPLANT
SUCKER WEIGHTED FLEX (MISCELLANEOUS) ×6 IMPLANT
SUT BONE WAX W31G (SUTURE) ×3 IMPLANT
SUT E-PACK MINIMALLY INVASIVE (SUTURE) ×3 IMPLANT
SUT ETHIBOND (SUTURE) ×6 IMPLANT
SUT ETHIBOND 2-0 RB-1 WHT (SUTURE) ×6 IMPLANT
SUT ETHIBOND X763 2 0 SH 1 (SUTURE) ×3 IMPLANT
SUT GORETEX CV 4 TH 22 36 (SUTURE) ×3 IMPLANT
SUT GORETEX CV4 TH-18 (SUTURE) ×6 IMPLANT
SUT PROLENE 4 0 RB 1 (SUTURE) ×6
SUT PROLENE 4-0 RB1 .5 CRCL 36 (SUTURE) ×12 IMPLANT
SUT PROLENE 5 0 C1 (SUTURE) ×3 IMPLANT
SUT PROLENE 6 0 C 1 30 (SUTURE) ×15 IMPLANT
SUT SILK 3 0SH CR/8 30 (SUTURE) ×3 IMPLANT
SUT VIC AB 3-0 SH 8-18 (SUTURE) ×6 IMPLANT
SYRINGE 10CC LL (SYRINGE) ×3 IMPLANT
SYSTEM SAHARA CHEST DRAIN ATS (WOUND CARE) ×6 IMPLANT
TAPE CLOTH SURG 4X10 WHT LF (GAUZE/BANDAGES/DRESSINGS) ×6 IMPLANT
TAPE PAPER 2X10 WHT MICROPORE (GAUZE/BANDAGES/DRESSINGS) ×3 IMPLANT
TOWEL OR 17X24 6PK STRL BLUE (TOWEL DISPOSABLE) ×3 IMPLANT
TOWEL OR 17X26 10 PK STRL BLUE (TOWEL DISPOSABLE) ×3 IMPLANT
TRAY FOLEY IC TEMP SENS 16FR (CATHETERS) ×3 IMPLANT
TROCAR XCEL BLADELESS 5X75MML (TROCAR) ×3 IMPLANT
TROCAR XCEL NON-BLD 11X100MML (ENDOMECHANICALS) ×6 IMPLANT
TUBE ANAEROBIC SPECIMEN COL (MISCELLANEOUS) ×3 IMPLANT
TUNNELER SHEATH ON-Q 11GX8 DSP (PAIN MANAGEMENT) IMPLANT
UNDERPAD 30X30 INCONTINENT (UNDERPADS AND DIAPERS) ×3 IMPLANT
WATER STERILE IRR 1000ML POUR (IV SOLUTION) ×6 IMPLANT
WIRE BENTSON .035X145CM (WIRE) ×3 IMPLANT

## 2015-02-18 NOTE — Progress Notes (Signed)
Returned pt to full support. Pt will not follow commands for parameters to extubate. Will attempt again at later time.

## 2015-02-18 NOTE — Progress Notes (Signed)
EKG CRITICAL VALUE     12 lead EKG performed.  Critical value noted.  Larina BrasEmilee Smith, RN notified.   Evangeline DakinWALKER, Matthewjames Petrasek C, CCT 02/18/2015 4:46 PM

## 2015-02-18 NOTE — OR Nursing (Signed)
14:50 - 1st call to SICU charge nurse

## 2015-02-18 NOTE — Anesthesia Preprocedure Evaluation (Addendum)
Anesthesia Evaluation  Patient identified by MRN, date of birth, ID band Patient awake    Reviewed: Allergy & Precautions, NPO status , Patient's Chart, lab work & pertinent test results  History of Anesthesia Complications (+) history of anesthetic complications  Airway Mallampati: II  TM Distance: >3 FB Neck ROM: Full    Dental  (+) Dental Advisory Given, Edentulous Upper, Poor Dentition   Pulmonary former smoker,  breath sounds clear to auscultation  + decreased breath sounds      Cardiovascular hypertension, Pt. on medications + Peripheral Vascular Disease and +CHF + Valvular Problems/Murmurs MR Rhythm:Regular Rate:Normal + Systolic murmurs    Neuro/Psych    GI/Hepatic   Endo/Other  diabetes, Poorly Controlled, Type 2  Renal/GU Renal Insufficiency and ARFRenal disease     Musculoskeletal   Abdominal   Peds  Hematology   Anesthesia Other Findings   Reproductive/Obstetrics                            Anesthesia Physical Anesthesia Plan  ASA: IV  Anesthesia Plan: General   Post-op Pain Management:    Induction: Intravenous  Airway Management Planned: Oral ETT  Additional Equipment: 3D TEE, Arterial line and CVP  Intra-op Plan:   Post-operative Plan: Post-operative intubation/ventilation  Informed Consent: I have reviewed the patients History and Physical, chart, labs and discussed the procedure including the risks, benefits and alternatives for the proposed anesthesia with the patient or authorized representative who has indicated his/her understanding and acceptance.     Plan Discussed with: CRNA and Anesthesiologist  Anesthesia Plan Comments:         Anesthesia Quick Evaluation

## 2015-02-18 NOTE — Progress Notes (Signed)
Patient ID: Dwayne HickmanBenjamin [name], male   DOB: 10/01/1980, 35 y.o.   MRN: [id number] EVENING ROUNDS NOTE :     [address]       [place] [id number]             [phone]                 Day of Surgery Procedure(s) (LRB): MINIMALLY INVASIVE MITRAL VALVE (MV) REPAIR (Right) TRANSESOPHAGEAL ECHOCARDIOGRAM (TEE) (N/A)  Total Length of Stay:  LOS: 16 days  BP 146/104 mmHg  Pulse 87  Temp(Src) 96.4 F (35.8 C) (Core (Comment))  Resp 12  Ht 6\' 2"  (1.88 m)  Wt 252 lb 12.8 oz (114.669 kg)  BMI 32.44 kg/m2  SpO2 100%  .Intake/Output      02/18 0701 - 02/19 0700 02/19 0701 - 02/20 0700   P.O. 480    I.V. (mL/kg)  3054 (26.6)   Blood  700   NG/GT  30   IV Piggyback  50   Total Intake(mL/kg) 480 (4.2) 3834 (33.4)   Urine (mL/kg/hr) 2900 (1.1) 935 (0.7)   Blood  1100 (0.9)   Chest Tube  550 (0.4)   Total Output 2900 2585   Net -2420 +1249          . sodium chloride 20 mL/hr at 02/18/15 1630  . [START ON 02/19/2015] sodium chloride    . sodium chloride 20 mL/hr at 02/18/15 1630  . sodium chloride 100 mL/hr at 02/18/15 1630  . dexmedetomidine 0.4 mcg/kg/hr (02/18/15 1700)  . insulin (NOVOLIN-R) infusion 0.3 Units/hr (02/18/15 1700)  . lactated ringers    . nitroGLYCERIN 70 mcg/min (02/18/15 1730)  . phenylephrine (NEO-SYNEPHRINE) Adult infusion Stopped (02/18/15 1630)     Lab Results  Component Value Date   WBC 10.6* 02/18/2015   HGB 11.4* 02/18/2015   HCT 34.1* 02/18/2015   PLT 169 02/18/2015   GLUCOSE 160* 02/18/2015   CHOL 174 03/16/2014   TRIG 139 03/16/2014   HDL 43 03/16/2014   LDLCALC 103* 03/16/2014   ALT 20 02/02/2015   AST 18 02/02/2015   NA 140 02/18/2015   K 4.3 02/18/2015   CL 104 02/18/2015   CREATININE 1.80* 02/18/2015   BUN 24* 02/18/2015   CO2 31 02/18/2015   TSH 2.046 02/04/2015   INR 1.32 02/18/2015   HGBA1C 7.5* 02/02/2015   Early post op  No bleeding On NTG for increased BP CI low   Delight OvensEdward B Aldair Rickel MD  Beeper  [phone] Office [phone] 02/18/2015 6:08 PM

## 2015-02-18 NOTE — Anesthesia Procedure Notes (Addendum)
Procedure Name: Intubation Date/Time: 02/18/2015 7:47 AM Performed by: Elon AlasLEE, Alijah Akram BROWN Pre-anesthesia Checklist: Timeout performed, Patient identified, Emergency Drugs available, Suction available and Patient being monitored Patient Re-evaluated:Patient Re-evaluated prior to inductionOxygen Delivery Method: Circle system utilized Preoxygenation: Pre-oxygenation with 100% oxygen Intubation Type: IV induction Ventilation: Mask ventilation without difficulty and Oral airway inserted - appropriate to patient size Laryngoscope Size: Mac and 4 Grade View: Grade I Endobronchial tube: Double lumen EBT and Left and 39 Fr Number of attempts: 1 Airway Equipment and Method: Stylet Placement Confirmation: positive ETCO2,  CO2 detector,  ETT inserted through vocal cords under direct vision and breath sounds checked- equal and bilateral Tube secured with: Tape Dental Injury: Teeth and Oropharynx as per pre-operative assessment

## 2015-02-18 NOTE — Transfer of Care (Signed)
Immediate Anesthesia Transfer of Care Note  Patient: Rob HickmanBenjamin Grills  Procedure(s) Performed: Procedure(s): MINIMALLY INVASIVE MITRAL VALVE (MV) REPAIR (Right) TRANSESOPHAGEAL ECHOCARDIOGRAM (TEE) (N/A)  Patient Location: SICU  Anesthesia Type:General  Level of Consciousness: Patient remains intubated per anesthesia plan  Airway & Oxygen Therapy: Patient remains intubated per anesthesia plan and Patient placed on Ventilator (see vital sign flow sheet for setting)  Post-op Assessment: Report given to RN and Post -op Vital signs reviewed and stable  Post vital signs: Reviewed and stable  Last Vitals:  Filed Vitals:   02/18/15 0544  BP: 130/79  Pulse: 87  Temp: 36.9 C  Resp: 18   HR 87 BP 140/85, Sats 98% on vent Complications: No apparent anesthesia complications

## 2015-02-18 NOTE — CV Procedure (Signed)
Intra-operative Transesophageal Echocardiography Report:  [name] is a 35 year old African-American male with poorly controlled hypertension and type 2 diabetes. 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 He also suffered  septic emboli to the brain, right  shoulder septic rthritis, and thoracic vertebral osteomyelitis. He is now scheduled to undergo mitral valve repair or replacement by Dr. Cornelius Moraswen. Intraoperative transesophageal echocardiography was requested to evaluate the mitral valve and to assess the feasibility of repair versus replacement, to assess for any other valvular pathology, and to serve as a monitor for intraoperative volume status and left and right ventricular function.  The patient was brought to the operating room [facility] and general anesthesia was induced without difficulty. Following endotracheal intubation and orogastric suctioning, the transesophageal echocardiography probe was inserted into the esophagus without difficulty.  Impression: Pre-bypass findings:  1. Aortic valve: The aortic valve was trileaflet. The leaflets opened normally without restriction. There was no aortic insufficiency. There was no thickening or calcification of the leaflets noted. There were no vegetations noted on the aortic leaflets.  2. Mitral valve: There was severe mitral insufficiency. The mitral insufficiency jet originated from the region of the posterior medial commissure. No mitral vegetations could be appreciated. No flail or prolapsing segments were appreciated. There was also a central jet of mitral insufficiency which was graded as trace. There were no other obvious foci of mitral insufficiency.  3. Left ventricle: The left ventricular cavity was dilated. Left ventricular end-diastolic diameter measured 5.3 cm. There was normal left ventricular wall thickness which measured 0.9-1.0 cm in diameter at end  diastole at the mid-papillary level. There was good contractility in all segments interrogated and the ejection fraction was estimated at 60-65%.  4. Right ventricle: The right ventricular cavity was normal in size. There was mild right ventricular dysfunction. There was decreased contractility of the right ventricular free wall and some evidence of decreased tricuspid annular systolic excursion.  5. Tricuspid valve: The tricuspid valve appeared structurally normal. There were no vegetations noted on the tricuspid valve. There was trace tricuspid insufficiency.  6. Pulmonic valve: The pulmonic valve appeared competent and there were no vegetations appreciated on the valve leaflets.  7. Interatrial septum: Interatrial septum was intact without evidence of patent foramen ovale or atrial septal defect by color Doppler.  8. Left atrium: The left atrial cavity was mildly enlarged and measured 5.12 cm in the superior inferior dimension and 3.19 cm in the mediolateral dimension. There was no thrombus noted within the left atrium or left atrial appendage.  9. Ascending aorta: The ascending aorta was free of atheromatous disease. The aortic root and sinotubular ridge were well-defined without effacement. There was no dilatation of the ascending aorta noted.  10. Descending aorta: There was no evidence of atheromatous disease of the descending aorta.  Post-bypass findings:  1. Aortic valve: The aortic leaflets opened normally and there was no aortic insufficiency. The leaflets appeared unchanged from the pre-bypass study.   2. Mitral valve: There was an annuloplasty ring in the mitral position. The posterior leaflet was fixed and the anterior leaflet opened with the classic trapdoor appearance.. There was trace mitral insufficiency. There was no evidence of residual mitral insufficiency in the region of the posterior medial commissure. This was the area where the severe MR jet had originated on the  pre-bypass exam.  3. Left ventricle: Left ventricular cavity showed good contractility in all segments interrogated and ejection fraction was 60-65%.  4. Right ventricle: The right ventricular function appeared mild to moderately reduced. There is decreased contractility of the right ventricular free wall and decreased lateral tricuspid annular systolic excursion.  5. Tricuspid valve: The tricuspid valve appeared unchanged from the pre-bypass study there was trace tricuspid insufficiency.  Kipp Brood, M.D.

## 2015-02-18 NOTE — Progress Notes (Signed)
   Echocardiogram Echocardiogram Transesophageal has been performed.  [name], [name] 02/18/2015, 12:44 PM

## 2015-02-18 NOTE — Brief Op Note (Addendum)
02/02/2015 - 02/18/2015  1:45 PM  PATIENT:  Rob HickmanBenjamin Colleran  35 y.o. 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  MV Stenosis: No mitral valve stenosis.  MV Disease Functional Class: MV Disease Functional Class: Type I.   Etiology (Choose at least one and up to five): Endocarditis.  MV Lesions (Choose at least one): Other- Perforation of Posterior Leaflet P3  Mitral/Tricuspid/Pulmonary Valve Procedure  Mitral Valve Procedure Performed:  Repair: Annuloplasty. And Patch of Leaflet Perforation P3  Implant: Annuloplasty Device: Implant model number [id number], Size 28, Unique Device Identifier [id number].       COMPLICATIONS: None  BASELINE WEIGHT: 114 kg  PATIENT DISPOSITION:   TO SICU IN STABLE CONDITION  Radford Pease H 02/18/2015 3:24 PM

## 2015-02-18 NOTE — Progress Notes (Signed)
Dr Tyrone SageGerhardt updated on CI 1.2-1.4, BP stable and UOP >20, encouraged to give Albumin to increase CI but okay to wean to extubate with low CI.

## 2015-02-18 NOTE — Progress Notes (Signed)
Shaved pt from neck to ankles prior to mitral valve surgery. Awaiting shower to finish to do EKG and draw labs

## 2015-02-18 NOTE — Op Note (Signed)
CARDIOTHORACIC SURGERY OPERATIVE NOTE  Date of Procedure:  02/18/2015  Preoperative Diagnosis:   Staphylococcus aureus bacterial endocarditis  Severe Mitral Regurgitation  Postoperative Diagnosis: Same  Procedure:    Minimally-Invasive Mitral Valve Repair  Complex valvuloplasty including bovine pericardial patch repair of posterior leaflet perforation  Sorin Memo 3D Ring Annuloplasty (size 28mm, catalog # [id number], serial # [id number])    Surgeon: Salvatore Decent. 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 The patient also complained of recent onset of diffuse myalgias and pain localized to the right shoulder and the left thigh. 2 of 2 sets of blood cultures obtained 01/10/2015 grew Staphylococcus aureus sensitive to both penicillin and oxacillin. Transthoracic echocardiogram obtained 01/11/2015 did not reveal any signs of vegetations nor significant valvular disease, but transesophageal echocardiogram performed 01/13/2015 revealed clear vegetation on the posterior leaflet of the mitral valve and severe mitral regurgitation. Cardiothoracic surgical consultation was requested.  The patient was initially seen in  consultation 01/14/2015.  He was evaluated by a multidisciplinary team at specialists including consultants from the infectious disease team, cardiology, nephrology, and orthopedic surgery. The patient was found to have multiple complications related to endocarditis including congestive heart failure, subclinical septic embolization to the brain, acute renal failure, septic arthritis involving the right shoulder. Renal failure recovered without need for dialysis. Initial attempts at management of septic arthritis without surgical intervention failed and the patient ultimately underwent arthroscopic joint debridement and washout by Dr. Lajoyce Corners.  The patient underwent dental extraction. Cardiac catheterization revealed no significant coronary artery disease. The patient was treated with more than 4 weeks of continuous intravenous antibiotic therapy and remained clinically stable. Follow-up blood cultures on antibiotics all remained no growth. The patient gradually improved to the point where it was felt he was at optimal timing for surgical intervention.  The patient and his wife were counseled at length regarding the indications, risks and potential benefits of surgery.  All questions have been answered, and the patient provides full informed consent for the operation as described.     DETAILS OF THE OPERATIVE PROCEDURE  Preparation:  The patient is brought to the operating room on the above mentioned date and central monitoring was established by the anesthesia team including placement of Swan-Ganz catheter through the left internal jugular vein.  A radial arterial line is placed. The patient is placed in the supine position on the operating table.  Intravenous antibiotics are administered. General endotracheal anesthesia is induced uneventfully. The patient is initially intubated using a dual lumen endotracheal tube.  A Foley catheter is placed.  Baseline transesophageal echocardiogram was performed.   Findings were notable for Severe (4+) mitral regurgitation. There appeared to be a perforation in the P3 segment of the posterior leaflet.  There was  normal leaflet motion with no sign of any ruptured chordae tendineae. There were no sign of any residual vegetations. There was normal left ventricular size and systolic function. Aortic valve appeared normal. The tricuspid valve appeared normal. Right ventricle was not dilated.  A soft roll is placed behind the patient's left scapula and the neck gently extended and turned to the left.   The patient's right neck, chest, abdomen, both groins, and both lower extremities are prepared and draped in a sterile manner. A time out procedure is performed.  Surgical Approach:  A right miniature anterolateral thoracotomy incision is performed. The incision is placed just lateral to and superior to the right nipple. The pectoralis major muscle is retracted medially and completely preserved. The right pleural space is entered through the 3rd intercostal space. A soft tissue retractor is placed.  Two 11 mm ports are placed through separate stab incisions inferiorly. The right pleural space is insufflated continuously with carbon dioxide gas through the posterior port during the remainder of the operation.  A pledgeted sutures placed through the dome of the right hemidiaphragm and retracted inferiorly to facilitate exposure.  A longitudinal incision is made in the pericardium 3 cm anterior to the phrenic nerve and silk traction sutures are placed on either side of the incision for exposure.  There was hemorrhagic pericarditis with subacute inflammation throughout the pericardial space. A swab culture of the pericardial fluid was sent for routine culture.   Extracorporeal Cardiopulmonary Bypass and Myocardial Protection:  A small incision is made in the right inguinal crease and the anterior surface of the right common femoral artery and right common femoral vein are  identified.  There were dense adhesions in the right groin, presumably from the patient's cardiac catheterization.  The patient is placed in Trendelenburg position. The right internal jugular vein is cannulated with Seldinger technique and a guidewire advanced into the right atrium. The patient is heparinized systemically. The right internal jugular vein is cannulated with a 15 [place]French pediatric femoral venous cannula. Pursestring sutures are placed on the anterior surface of the right common femoral vein and right common femoral artery. The right common femoral vein is cannulated with the Seldinger technique and a guidewire is advanced under transesophageal echocardiogram guidance through the right atrium. The femoral vein is cannulated with a long 22 French femoral venous cannula. The right common femoral artery is cannulated with Seldinger technique and a flexible guidewire is advanced until it can be appreciated intraluminally in the descending thoracic aorta on transesophageal echocardiogram. The femoral artery is cannulated with an 18 French femoral arterial cannula.  Adequate heparinization is verified.     The entire pre-bypass portion of the operation was notable for stable hemodynamics.  Cardiopulmonary bypass was begun.  Vacuum assist venous drainage is utilized. The incision in the pericardium is extended in both directions. Venous drainage and exposure are notably excellent. A retrograde cardioplegia cannula is placed through the right atrium into the coronary sinus using transesophageal echocardiogram guidance.  An antegrade cardioplegia cannula is placed in the ascending aorta.    The patient is cooled to 28C systemic temperature.  The aortic cross clamp is applied and cold blood cardioplegia is delivered initially in an antegrade fashion through the aortic root.  Supplemental cardioplegia is given retrograde through the coronary sinus catheter. The initial cardioplegic arrest is rapid with  early diastolic arrest.  Repeat doses of cardioplegia are administered intermittently every 20 to 30 minutes throughout the entire cross clamp portion of the operation  through the aortic root and through the coronary sinus catheter in order to maintain completely flat electrocardiogram.  Myocardial protection was felt to be excellent.   Mitral Valve Repair:  A left atriotomy incision was performed through the interatrial groove and extended partially across the back wall of the left atrium after opening the oblique sinus inferiorly.  The mitral valve is exposed using a self-retaining retractor.  The mitral valve was inspected and notable for the absence of any active vegetations or other sign of active infection.  There was an obvious hole close to the base of the P3 segment of the posterior leaflet. There was no signs of any other damage elsewhere to the valve. All of the subvalvular apparatus was intact.  Swab culture of the valve was sent for routine culture.  The mitral valve, left atrium, and left ventral were then irrigated with 3 L of saline solution using a pulse lavage irrigation device. The valve was then irrigated with both vancomycin solution and polymyxin/bacitracin/gentamicin solution.  Interrupted 2-0 Ethibond horizontal mattress sutures are placed circumferentially around the entire mitral valve annulus. The sutures will ultimately be utilized for ring annuloplasty, and at this juncture there are utilized to suspend the valve symmetrically.  The hole in the posterior leaflet was carefully examined. Leaflet tissue was debrided circumferentially around the hole to get back to fresh, undamaged tissue. Portions of the resected leaflet tissue were sent for culture and histology.  The valve was again irrigated with both vancomycin solution and polymyxin/bacitracin/gentamicin solution.  A portion of bovine pericardium is trimmed to an appropriate size and shape for patch reconstruction of the  posterior leaflet. The patch is sewn in place using a 2 layer closure of running 4-0 Prolene suture.  The valve was tested with saline and appeared competent even without ring annuloplasty complete. The valve was sized to a 28 mm annuloplasty ring, based upon the transverse distance between the left and right commissures and the height of the anterior leaflet, corresponding to a size just slightly larger than the overall surface area of the anterior leaflet.  A Sorin Memo 3D annuloplasty ring (size 28mm, catalog [id number], serial [id number]) was secured in place uneventfully. All ring sutures were secured using a Cor-knot device.  The valve was tested with saline and appeared competent.   The valve is again tested with saline and appears to be perfectly competent with a broad symmetrical line of coaptation of the anterior and posterior leaflet. There is no residual leak. There was a broad, symmetrical line of coaptation of the anterior and posterior leaflet which was confirmed using the blue ink test.  Rewarming is begun.   Procedure Completion:  The atriotomy was closed using a 2-layer closure of running 3-0 Prolene suture after placing a sump drain across the mitral valve to serve as a left ventricular vent.  One final dose of warm retrograde "hot shot" cardioplegia was administered retrograde through the coronary sinus catheter while all air was evacuated through the aortic root.  The aortic cross clamp was removed after a total cross clamp time of 140 minutes.  Epicardial pacing wires are fixed to the inferior wall of the right ventricule and to the right atrial appendage. The patient is rewarmed to 37C temperature. The left ventricular vent is removed.  The patient is ventilated and flow volumes turndown while the mitral valve repair is inspected using transesophageal echocardiogram. The valve repair appears intact with no residual leak. The antegrade cardioplegia cannula is now removed. The patient  is  weaned and disconnected from cardiopulmonary bypass.  The patient's rhythm at separation from bypass was sinus.  The patient was weaned from bypass without any inotropic support. Total cardiopulmonary bypass time for the operation was 199 minutes.  Followup transesophageal echocardiogram performed after separation from bypass revealed a well-seated annuloplasty ring in the mitral position with a normal functioning mitral valve. There was no residual leak.  Left ventricular function was unchanged from preoperatively.  The mean gradient across the mitral valve was estimated to be 4 mmHg.  The femoral arterial and venous cannulae were removed uneventfully. There was a palpable pulse in the distal right common femoral artery after removal of the cannula. Protamine was administered to reverse the anticoagulation. The right internal jugular cannula was removed and manual pressure held on the neck for 15 minutes.  Single lung ventilation was begun. The atriotomy closure was inspected for hemostasis. The pericardial sac was drained using a 28 French Bard drain placed through the anterior port incision.  The pericardium was closed using a patch of core matrix bovine submucosal tissue patch. The right pleural space is irrigated with saline solution and inspected for hemostasis. The right pleural space was drained using a 28 French Bard drain placed through the posterior port incision. The miniature thoracotomy incision was closed in multiple layers in routine fashion. The right groin incision was inspected for hemostasis and closed in multiple layers in routine fashion.  The post-bypass portion of the operation was notable for stable rhythm and hemodynamics.  The patient received 3 units packed red blood cells during the procedure due to anemia which was present preoperatively and exacerbated by acute blood loss and hemodilution during cardiopulmonary bypass.    Disposition:  The patient tolerated the procedure  well.  The patient was reintubated using a single lumen endotracheal tube and subsequently transported to the surgical intensive care unit in stable condition. There were no intraoperative complications. All sponge instrument and needle counts are verified correct at completion of the operation.     Salvatore Decent. Cornelius Moras MD 02/18/2015 3:22 PM

## 2015-02-19 ENCOUNTER — Inpatient Hospital Stay (HOSPITAL_COMMUNITY): Payer: Medicaid Other

## 2015-02-19 DIAGNOSIS — Z9889 Other specified postprocedural states: Secondary | ICD-10-CM

## 2015-02-19 LAB — GLUCOSE, CAPILLARY
GLUCOSE-CAPILLARY: 133 mg/dL — AB (ref 70–99)
GLUCOSE-CAPILLARY: 141 mg/dL — AB (ref 70–99)
GLUCOSE-CAPILLARY: 139 mg/dL — AB (ref 70–99)
GLUCOSE-CAPILLARY: 106 mg/dL — AB (ref 70–99)
GLUCOSE-CAPILLARY: 98 mg/dL (ref 70–99)
GLUCOSE-CAPILLARY: 138 mg/dL — AB (ref 70–99)
GLUCOSE-CAPILLARY: 141 mg/dL — AB (ref 70–99)
Glucose-Capillary: 105 mg/dL — ABNORMAL HIGH (ref 70–99)
Glucose-Capillary: 113 mg/dL — ABNORMAL HIGH (ref 70–99)
Glucose-Capillary: 119 mg/dL — ABNORMAL HIGH (ref 70–99)
Glucose-Capillary: 130 mg/dL — ABNORMAL HIGH (ref 70–99)
Glucose-Capillary: 143 mg/dL — ABNORMAL HIGH (ref 70–99)
Glucose-Capillary: 159 mg/dL — ABNORMAL HIGH (ref 70–99)
Glucose-Capillary: 74 mg/dL (ref 70–99)
Glucose-Capillary: 111 mg/dL — ABNORMAL HIGH (ref 70–99)
Glucose-Capillary: 120 mg/dL — ABNORMAL HIGH (ref 70–99)
Glucose-Capillary: 121 mg/dL — ABNORMAL HIGH (ref 70–99)
Glucose-Capillary: 131 mg/dL — ABNORMAL HIGH (ref 70–99)
Glucose-Capillary: 135 mg/dL — ABNORMAL HIGH (ref 70–99)
Glucose-Capillary: 139 mg/dL — ABNORMAL HIGH (ref 70–99)
Glucose-Capillary: 163 mg/dL — ABNORMAL HIGH (ref 70–99)

## 2015-02-19 LAB — CBC
HCT: 30.1 % — ABNORMAL LOW (ref 39.0–52.0)
HEMATOCRIT: 29.7 % — AB (ref 39.0–52.0)
HEMOGLOBIN: 10 g/dL — AB (ref 13.0–17.0)
Hemoglobin: 10 g/dL — ABNORMAL LOW (ref 13.0–17.0)
MCH: 28.4 pg (ref 26.0–34.0)
MCH: 28.9 pg (ref 26.0–34.0)
MCHC: 33.7 g/dL (ref 30.0–36.0)
MCHC: 33.2 g/dL (ref 30.0–36.0)
MCV: 84.4 fL (ref 78.0–100.0)
MCV: 87 fL (ref 78.0–100.0)
PLATELETS: 189 10*3/uL (ref 150–400)
PLATELETS: 202 10*3/uL (ref 150–400)
RBC: 3.52 MIL/uL — AB (ref 4.22–5.81)
RBC: 3.46 MIL/uL — AB (ref 4.22–5.81)
RDW: 14.4 % (ref 11.5–15.5)
RDW: 14.8 % (ref 11.5–15.5)
WBC: 11.4 10*3/uL — ABNORMAL HIGH (ref 4.0–10.5)
WBC: 14.8 10*3/uL — ABNORMAL HIGH (ref 4.0–10.5)

## 2015-02-19 LAB — BASIC METABOLIC PANEL
ANION GAP: 6 (ref 5–15)
BUN: 31 mg/dL — AB (ref 6–23)
CO2: 23 mmol/L (ref 19–32)
Calcium: 8 mg/dL — ABNORMAL LOW (ref 8.4–10.5)
Chloride: 110 mmol/L (ref 96–112)
Creatinine, Ser: 2.09 mg/dL — ABNORMAL HIGH (ref 0.50–1.35)
GFR calc non Af Amer: 40 mL/min — ABNORMAL LOW (ref >90)
GFR, EST AFRICAN AMERICAN: 46 mL/min — AB (ref >90)
Glucose, Bld: 118 mg/dL — ABNORMAL HIGH (ref 70–99)
Potassium: 4.1 mmol/L (ref 3.5–5.1)
Sodium: 139 mmol/L (ref 135–145)

## 2015-02-19 LAB — POCT I-STAT 3, ART BLOOD GAS (G3+)
ACID-BASE DEFICIT: 1 mmol/L (ref 0.0–2.0)
Acid-base deficit: 2 mmol/L (ref 0.0–2.0)
BICARBONATE: 22.8 meq/L (ref 20.0–24.0)
Bicarbonate: 24 mEq/L (ref 20.0–24.0)
O2 SAT: 99 %
O2 Saturation: 100 %
PCO2 ART: 38.8 mmHg (ref 35.0–45.0)
PCO2 ART: 40.8 mmHg (ref 35.0–45.0)
Patient temperature: 36.6
TCO2: 24 mmol/L (ref 0–100)
TCO2: 25 mmol/L (ref 0–100)
pH, Arterial: 7.375 (ref 7.350–7.450)
pH, Arterial: 7.376 (ref 7.350–7.450)
pO2, Arterial: 172 mmHg — ABNORMAL HIGH (ref 80.0–100.0)
pO2, Arterial: 148 mmHg — ABNORMAL HIGH (ref 80.0–100.0)

## 2015-02-19 LAB — HEMOGLOBIN A1C
Hgb A1c MFr Bld: 6.8 % — ABNORMAL HIGH (ref 4.8–5.6)
Mean Plasma Glucose: 148 mg/dL

## 2015-02-19 LAB — POCT I-STAT, CHEM 8
BUN: 30 mg/dL — AB (ref 6–23)
Calcium, Ion: 1.17 mmol/L (ref 1.12–1.23)
Chloride: 110 mmol/L (ref 96–112)
Creatinine, Ser: 1.9 mg/dL — ABNORMAL HIGH (ref 0.50–1.35)
GLUCOSE: 136 mg/dL — AB (ref 70–99)
HCT: 32 % — ABNORMAL LOW (ref 39.0–52.0)
Hemoglobin: 10.9 g/dL — ABNORMAL LOW (ref 13.0–17.0)
POTASSIUM: 3.6 mmol/L (ref 3.5–5.1)
Sodium: 143 mmol/L (ref 135–145)
TCO2: 18 mmol/L (ref 0–100)

## 2015-02-19 LAB — MAGNESIUM
MAGNESIUM: 2 mg/dL (ref 1.5–2.5)
Magnesium: 2.4 mg/dL (ref 1.5–2.5)

## 2015-02-19 LAB — CREATININE, SERUM
Creatinine, Ser: 1.92 mg/dL — ABNORMAL HIGH (ref 0.50–1.35)
GFR calc non Af Amer: 44 mL/min — ABNORMAL LOW (ref >90)
GFR, EST AFRICAN AMERICAN: 51 mL/min — AB (ref >90)

## 2015-02-19 MED ORDER — POTASSIUM CHLORIDE 10 MEQ/50ML IV SOLN
10.0000 meq | INTRAVENOUS | Status: AC
  Administered 2015-02-19 (×3): 10 meq via INTRAVENOUS
  Filled 2015-02-19: qty 50

## 2015-02-19 MED ORDER — METOPROLOL TARTRATE 25 MG PO TABS
25.0000 mg | ORAL_TABLET | Freq: Two times a day (BID) | ORAL | Status: DC
  Administered 2015-02-19: 25 mg via ORAL
  Filled 2015-02-19 (×2): qty 1

## 2015-02-19 MED ORDER — METOPROLOL TARTRATE 50 MG PO TABS
50.0000 mg | ORAL_TABLET | Freq: Two times a day (BID) | ORAL | Status: DC
  Administered 2015-02-20 (×2): 50 mg via ORAL
  Filled 2015-02-19 (×5): qty 1

## 2015-02-19 MED ORDER — INSULIN DETEMIR 100 UNIT/ML ~~LOC~~ SOLN: 25.0000 [IU] | Freq: Every day | SUBCUTANEOUS | Status: DC

## 2015-02-19 MED ORDER — HYDRALAZINE HCL 25 MG PO TABS
25.0000 mg | ORAL_TABLET | Freq: Three times a day (TID) | ORAL | Status: DC
  Administered 2015-02-19 – 2015-02-25 (×19): 25 mg via ORAL
  Filled 2015-02-19 (×21): qty 1

## 2015-02-19 MED ORDER — METOPROLOL TARTRATE 25 MG/10 ML ORAL SUSPENSION
50.0000 mg | Freq: Two times a day (BID) | ORAL | Status: DC
  Filled 2015-02-19 (×5): qty 20

## 2015-02-19 MED ORDER — METOPROLOL TARTRATE 25 MG/10 ML ORAL SUSPENSION
25.0000 mg | Freq: Two times a day (BID) | ORAL | Status: DC
  Filled 2015-02-19 (×2): qty 10

## 2015-02-19 MED ORDER — CLONIDINE HCL 0.2 MG/24HR TD PTWK
0.2000 mg | MEDICATED_PATCH | TRANSDERMAL | Status: DC
  Administered 2015-02-19: 0.2 mg via TRANSDERMAL
  Filled 2015-02-19: qty 1

## 2015-02-19 MED ORDER — INSULIN DETEMIR 100 UNIT/ML ~~LOC~~ SOLN
25.0000 [IU] | Freq: Every day | SUBCUTANEOUS | Status: DC
  Administered 2015-02-19 – 2015-02-20 (×2): 25 [IU] via SUBCUTANEOUS
  Filled 2015-02-19 (×3): qty 0.25

## 2015-02-19 MED ORDER — FUROSEMIDE 10 MG/ML IJ SOLN
40.0000 mg | Freq: Four times a day (QID) | INTRAMUSCULAR | Status: DC
  Administered 2015-02-19 – 2015-02-20 (×5): 40 mg via INTRAVENOUS
  Filled 2015-02-19 (×9): qty 4

## 2015-02-19 MED ORDER — INSULIN ASPART 100 UNIT/ML ~~LOC~~ SOLN
0.0000 [IU] | SUBCUTANEOUS | Status: DC
  Administered 2015-02-19: 2 [IU] via SUBCUTANEOUS
  Administered 2015-02-19: 4 [IU] via SUBCUTANEOUS
  Administered 2015-02-19 – 2015-02-20 (×5): 2 [IU] via SUBCUTANEOUS

## 2015-02-19 NOTE — Progress Notes (Signed)
Subjective:  S/P mitral valve repair- had a little trouble getting extubated but is now- making urine and creatinine stable- got 4 liters in OR  Objective Vital signs in last 24 hours: Filed Vitals:   02/19/15 0645 02/19/15 0700 02/19/15 0715 02/19/15 0720  BP:  125/72    Pulse: 82 81 82   Temp: 97.9 F (36.6 C) 97.9 F (36.6 C) 97.9 F (36.6 C) 98.2 F (36.8 C)  TempSrc:    Oral  Resp: 18 16 14    Height:      Weight:      SpO2: 100% 98% 100%    Weight change:   Intake/Output Summary (Last 24 hours) at 02/19/15 0754 Last data filed at 02/19/15 0700  Gross per 24 hour  Intake 6304.29 ml  Output   3940 ml  Net 2364.29 ml    Assessment/Plan: 35 year old black male with history of poorly controlled hypertension and diabetes - diagnosed last month with MSSA bacteremia and 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  1.Renal-  baseline renal insufficiency and CKD on the basis of his DM and HTN.  I suspect that his most recent acute on chronic renal failure is a result of relative hypotension and decreased renal perfusion.It improved pre op and now is post op from mitral valve repair. Fortunately, his renal function appears to be stable and he is making urine. I am going to add on scheduled lasix to assist with his volume overload as it seems his blood pressure will tolerate 2. Hypertension/volume - he has massive edema. Not sure how much this has to do with his mitral regurg, he has hypoalbuminemia which is going to make him more prone to edema and is going to be difficult fix at this juncture.   Diurese as able trying not to drop blood pressure too much 3. Anemia - is not helping his volume status either, have added ESA to help boost- appears to have been transfused   Areeba Sulser A    Labs: Basic Metabolic Panel:  Recent  Labs Lab 02/15/15 0330 02/16/15 0405 02/17/15 0455 02/18/15 0341  02/18/15 1425 02/18/15 1633 02/18/15 2217 02/18/15 2219 02/19/15 0404  NA 138 139 138 140  < > 140 141 140  --  139  K 3.9 3.7 3.5 3.7  < > 4.3 4.4 4.4  --  4.1  CL 102 104 104 103  < > 104  --  108  --  110  CO2 24 32 29 31  --   --   --   --   --  23  GLUCOSE 229* 190* 157* 187*  < > 160* 103* 138*  --  118*  BUN 42* 38* 31* 27*  < > 24*  --  28*  --  31*  CREATININE 3.15* 2.60* 2.19* 2.15*  < > 1.80*  --  1.90* 2.06* 2.09*  CALCIUM 8.6 8.7 8.6 9.0  --   --   --   --   --  8.0*  PHOS 6.0* 5.5* 4.9*  --   --   --   --   --   --   --   < > = values in this interval not displayed. Liver Function Tests:  Recent Labs Lab 02/15/15 0330 02/16/15 0405 02/17/15 0455  ALBUMIN 1.9* 1.9* 1.9*   No results  for input(s): LIPASE, AMYLASE in the last 168 hours. No results for input(s): AMMONIA in the last 168 hours. CBC:  Recent Labs Lab 02/14/15 0735 02/18/15 0341  02/18/15 1630  02/18/15 2217 02/18/15 2219 02/19/15 0404  WBC 4.3 5.8  --  10.6*  --   --  12.0* 11.4*  HGB 8.4* 10.2*  < > 11.4*  < > 10.5* 10.5* 10.0*  HCT 25.5* 31.6*  < > 34.1*  < > 31.0* 31.2* 29.7*  MCV 85.0 86.1  --  86.1  --   --  83.6 84.4  PLT 283 340  < > 169  --   --  183 189  < > = values in this interval not displayed. Cardiac Enzymes: No results for input(s): CKTOTAL, CKMB, CKMBINDEX, TROPONINI in the last 168 hours. CBG:  Recent Labs Lab 02/19/15 0100 02/19/15 0204 02/19/15 0258 02/19/15 0355 02/19/15 0547  GLUCAP 143* 139* 130* 119* 106*    Iron Studies: No results for input(s): IRON, TIBC, TRANSFERRIN, FERRITIN in the last 72 hours. Studies/Results: Dg Chest Port 1 View  02/19/2015   CLINICAL DATA:  Atelectasis  EXAM: PORTABLE CHEST - 1 VIEW  COMPARISON:  02/18/2015  FINDINGS: The endotracheal tube and nasogastric tube have been removed. The left subclavian Swan-Ganz catheter remains. The left jugular sheath remains. The  right chest tube remains. The left chest tube remains. Trace apical pneumothorax may be present on the right. No significant pneumothorax is evident. Minor basilar opacities are present, right greater than left, likely atelectatic. Heart size and contours unchanged.  IMPRESSION: Minor atelectatic appearing basilar opacities without significant interval change. Trace right apical pneumothorax.   Electronically Signed   By: Ellery Plunk M.D.   On: 02/19/2015 06:34   Dg Chest Port 1 View  02/18/2015   CLINICAL DATA:  Post open heart surgery, atelectasis, follow-up  EXAM: PORTABLE CHEST - 1 VIEW  COMPARISON:  Portable exam 1740 hours compared to 02/11/2015  FINDINGS: Tip of endotracheal tube projects approximately 4.5 cm above carina.  Nasogastric tube extends into stomach.  LEFT jugular central venous catheter projects over confluence of LEFT brachiocephalic vein.  RIGHT thoracostomy tube and mediastinal drain present.  LEFT subclavian Swan-Ganz catheter tip projects over RIGHT pulmonary artery.  Epicardial pacing wires identified.  Enlargement of cardiac silhouette.  Mediastinal contours and pulmonary vascularity normal.  Lungs clear.  No infiltrate, pleural effusion or pneumothorax.  IMPRESSION: Postoperative changes without acute abnormalities.   Electronically Signed   By: Ulyses Southward M.D.   On: 02/18/2015 17:47   Medications: Infusions: . sodium chloride 20 mL/hr at 02/19/15 0700  . sodium chloride    . sodium chloride 20 mL/hr at 02/19/15 0700  . insulin (NOVOLIN-R) infusion 0.8 Units/hr (02/19/15 0700)  . lactated ringers    . nitroGLYCERIN 40 mcg/min (02/19/15 0700)  . phenylephrine (NEO-SYNEPHRINE) Adult infusion Stopped (02/18/15 1630)    Scheduled Medications: . acetaminophen  1,000 mg Oral 4 times per day   Or  . acetaminophen (TYLENOL) oral liquid 160 mg/5 mL  1,000 mg Per Tube 4 times per day  . ampicillin (OMNIPEN) IV  2 g Intravenous 6 times per day  . antiseptic oral rinse  7  mL Mouth Rinse QID  . aspirin EC  325 mg Oral Daily   Or  . aspirin  324 mg Per Tube Daily  . bisacodyl  10 mg Oral Daily   Or  . bisacodyl  10 mg Rectal Daily  . cefUROXime (ZINACEF)  IV  1.5 g Intravenous Q12H  . chlorhexidine  15 mL Mouth Rinse BID  . docusate sodium  200 mg Oral Daily  . famotidine (PEPCID) IV  20 mg Intravenous Q12H  . insulin regular  0-10 Units Intravenous TID WC  . metoprolol tartrate  12.5 mg Oral BID   Or  . metoprolol tartrate  12.5 mg Per Tube BID  . [START ON 02/20/2015] pantoprazole  40 mg Oral Daily  . sodium chloride  3 mL Intravenous Q12H    have reviewed scheduled and prn medications.  Physical Exam: General: in ICU with chest tubes Heart: RRR Lungs: poor effort Abdomen: soft, non tender Extremities: pitting edema-     02/19/2015,7:54 AM  LOS: 17 days

## 2015-02-19 NOTE — Procedures (Signed)
Extubation Procedure Note  Patient Details:   Name: Dwayne HickmanBenjamin [name] DOB: 03/15/1980 MRN: [id number]  EXTUBATION NOTE   Pre Extubation: Pt completed Rapid Wean Protocol without complication. ABG acceptable. Pt follows all commands. NIF -22, VC 800ml. Cuff leak present.  Post Extubation: Pt extubated to 4L [place], Sats 97%. No Stridor. Pt coughs/clear secretions.Clear BBS. Pt able to state name/location.   Evaluation  O2 sats: stable throughout Complications: No apparent complications Patient did tolerate procedure well. Bilateral Breath Sounds: Diminished, Rhonchi Suctioning: Airway Yes  Dwayne [name], Dwayne [name] 02/19/2015, 2:51 AM

## 2015-02-19 NOTE — Progress Notes (Signed)
B/p remains elevated after all PRN medications given. Scheduled hypertensive medications given as scheduled. Notifed Dr. Tyrone SageGerhardt of b/p remaining elevated. Will cont. To administer PRN medications and tritrate Nitroglycerin as needed.

## 2015-02-19 NOTE — Progress Notes (Signed)
Patient ID: Dwayne HickmanBenjamin [name], male   DOB: 06/21/1980, 35 y.o.   MRN: [id number] EVENING ROUNDS NOTE :     [address]       [place] [id number]             [phone]                 1 Day Post-Op Procedure(s) (LRB): MINIMALLY INVASIVE MITRAL VALVE (MV) REPAIR (Right) TRANSESOPHAGEAL ECHOCARDIOGRAM (TEE) (N/A)  Total Length of Stay:  LOS: 17 days  BP 129/84 mmHg  Pulse 87  Temp(Src) 97.3 F (36.3 C) (Oral)  Resp 13  Ht 6\' 2"  (1.88 m)  Wt 246 lb 4.1 oz (111.7 kg)  BMI 31.60 kg/m2  SpO2 99%  .Intake/Output      02/20 0701 - 02/21 0700   P.O. 240   I.V. (mL/kg) 633.7 (5.7)   Blood    NG/GT    IV Piggyback 350   Total Intake(mL/kg) 1223.7 (11)   Urine (mL/kg/hr) 1525 (1.1)   Emesis/NG output    Blood    Chest Tube 280 (0.2)   Total Output 1805   Net -581.4         . sodium chloride 20 mL/hr at 02/19/15 1200  . sodium chloride    . sodium chloride 20 mL/hr at 02/19/15 1800  . lactated ringers    . nitroGLYCERIN 90 mcg/min (02/19/15 1900)  . phenylephrine (NEO-SYNEPHRINE) Adult infusion Stopped (02/18/15 1630)     Lab Results  Component Value Date   WBC 14.8* 02/19/2015   HGB 10.0* 02/19/2015   HCT 30.1* 02/19/2015   PLT 202 02/19/2015   GLUCOSE 136* 02/19/2015   CHOL 174 03/16/2014   TRIG 139 03/16/2014   HDL 43 03/16/2014   LDLCALC 103* 03/16/2014   ALT 20 02/02/2015   AST 18 02/02/2015   NA 143 02/19/2015   K 3.6 02/19/2015   CL 110 02/19/2015   CREATININE 1.92* 02/19/2015   BUN 30* 02/19/2015   CO2 23 02/19/2015   TSH 2.046 02/04/2015   INR 1.32 02/18/2015   HGBA1C 6.8* 02/18/2015   Difficulty to control hypertension increase beta blocker hydralazine add and clonidine patch   Delight OvensEdward B Andruw Battie MD  Beeper [phone] Office [phone] 02/19/2015 7:32 PM

## 2015-02-19 NOTE — Progress Notes (Signed)
Patient ID: Dwayne [name], male   DOB: Sep 16, 1980, 35 y.o.   MRN: [id number] TCTS DAILY ICU PROGRESS NOTE                   [address]            [name] [address]          [phone]   1 Day Post-Op Procedure(s) (LRB): MINIMALLY INVASIVE MITRAL VALVE (MV) REPAIR (Right) TRANSESOPHAGEAL ECHOCARDIOGRAM (TEE) (N/A)  Total Length of Stay:  LOS: 17 days   Subjective: Extubated, does not say much  Objective: Vital signs in last 24 hours: Temp:  [96.4 F (35.8 C)-98.2 F (36.8 C)] 97.9 F (36.6 C) (02/20 1100) Pulse Rate:  [76-91] 85 (02/20 1100) Cardiac Rhythm:  [-] Normal sinus rhythm;Other (Comment) (02/20 1100) Resp:  [0-28] 27 (02/20 1100) BP: (104-155)/(58-104) 124/75 mmHg (02/20 1100) SpO2:  [97 %-100 %] 99 % (02/20 1100) Arterial Line BP: (123-157)/(71-97) 125/76 mmHg (02/19 2030) FiO2 (%):  [40 %-50 %] 40 % (02/20 0200) Weight:  [246 lb 4.1 oz (111.7 kg)] 246 lb 4.1 oz (111.7 kg) (02/20 0430)  Filed Weights   02/15/15 0500 02/17/15 0500 02/19/15 0430  Weight: 252 lb 13.9 oz (114.7 kg) 252 lb 12.8 oz (114.669 kg) 246 lb 4.1 oz (111.7 kg)    Weight change:    Hemodynamic parameters for last 24 hours: PAP: (22-39)/(12-22) 34/22 mmHg CO:  [3.1 L/min-6.5 L/min] 6.5 L/min CI:  [1.2 L/min/m2-2.5 L/min/m2] 2.5 L/min/m2  Intake/Output from previous day: 02/19 0701 - 02/20 0700 In: 6304.3 [P.O.:60; I.V.:4684.3; Blood:700; NG/GT:60; IV Piggyback:800] Out: 3940 [Urine:1780; Emesis/NG output:250; Blood:1100; Chest Tube:810]  Intake/Output this shift: Total I/O In: 386.4 [I.V.:236.4; IV Piggyback:150] Out: 750 [Urine:650; Chest Tube:100]  Current Meds: Scheduled Meds: . acetaminophen  1,000 mg Oral 4 times per day   Or  . acetaminophen (TYLENOL) oral liquid 160 mg/5 mL  1,000 mg Per Tube 4 times per day  . ampicillin (OMNIPEN) IV  2 g Intravenous 6 times per day  . antiseptic oral rinse  7 mL Mouth Rinse QID  . aspirin EC  325 mg Oral Daily   Or  . aspirin  324 mg Per Tube Daily  . bisacodyl  10 mg Oral Daily   Or  . bisacodyl  10 mg Rectal Daily  . cefUROXime (ZINACEF)  IV  1.5 g Intravenous Q12H  . chlorhexidine  15 mL Mouth Rinse BID  . docusate sodium  200 mg Oral Daily  . furosemide  40 mg Intravenous Q6H  . insulin regular  0-10 Units Intravenous TID WC  . metoprolol tartrate  12.5 mg Oral BID   Or  . metoprolol tartrate  12.5 mg Per Tube BID  . [START ON 02/20/2015] pantoprazole  40 mg Oral Daily  . sodium chloride  3 mL Intravenous Q12H   Continuous Infusions: . sodium chloride 20 mL/hr at 02/19/15 1000  . sodium chloride    . sodium chloride 20 mL/hr at 02/19/15 1000  . insulin (NOVOLIN-R) infusion 1.5 Units/hr (02/19/15 1200)  . lactated ringers    . nitroGLYCERIN 90 mcg/min (02/19/15 1100)  . phenylephrine (NEO-SYNEPHRINE) Adult infusion Stopped (02/18/15 1630)   PRN Meds:.albumin human, labetalol, metoprolol, midazolam, morphine injection, ondansetron (ZOFRAN) IV, oxyCODONE, sodium chloride, traMADol  General appearance: alert and cooperative Neurologic: intact Heart: regular rate and rhythm, S1, S2 normal, no murmur, click, rub or gallop Lungs: dullness to percussion bibasilar Abdomen: soft, non-tender; bowel sounds normal; no masses,  no organomegaly Extremities: extremities normal, atraumatic, no cyanosis or edema and Homans sign is negative, no sign of DVT Wound: chest tubes intact more from left then right  Lab Results: CBC: Recent Labs  02/18/15 2219 02/19/15 0404  WBC 12.0* 11.4*  HGB 10.5* 10.0*  HCT 31.2* 29.7*  PLT 183 189   BMET:  Recent Labs  02/18/15 0341  02/18/15 2217 02/18/15 2219 02/19/15 0404  NA 140  < > 140  --  139  K 3.7  < > 4.4  --  4.1  CL 103  < > 108  --  110  CO2 31  --   --   --  23  GLUCOSE 187*  < > 138*  --  118*  BUN 27*  < > 28*  --  31*  CREATININE 2.15*  < > 1.90* 2.06* 2.09*  CALCIUM 9.0  --   --   --  8.0*  < > = values in this interval not  displayed.  PT/INR:  Recent Labs  02/18/15 1630  LABPROT 16.5*  INR 1.32   Radiology: Dg Chest Port 1 View  02/18/2015   CLINICAL DATA:  Post open heart surgery, atelectasis, follow-up  EXAM: PORTABLE CHEST - 1 VIEW  COMPARISON:  Portable exam 1740 hours compared to 02/11/2015  FINDINGS: Tip of endotracheal tube projects approximately 4.5 cm above carina.  Nasogastric tube extends into stomach.  LEFT jugular central venous catheter projects over confluence of LEFT brachiocephalic vein.  RIGHT thoracostomy tube and mediastinal drain present.  LEFT subclavian Swan-Ganz catheter tip projects over RIGHT pulmonary artery.  Epicardial pacing wires identified.  Enlargement of cardiac silhouette.  Mediastinal contours and pulmonary vascularity normal.  Lungs clear.  No infiltrate, pleural effusion or pneumothorax.  IMPRESSION: Postoperative changes without acute abnormalities.   Electronically Signed   By: Ulyses SouthwardMark  Boles M.D.   On: 02/18/2015 17:47     Assessment/Plan: S/P Procedure(s) (LRB): MINIMALLY INVASIVE MITRAL VALVE (MV) REPAIR (Right) TRANSESOPHAGEAL ECHOCARDIOGRAM (TEE) (N/A) Mobilize Diuresis Diabetes control Continue foley due to strict I&O and urinary output monitoring Expected Acute  Blood - loss Anemia Chronic kidney diease but appears stable    Cameryn Schum B 02/19/2015 12:27 PM

## 2015-02-19 NOTE — Progress Notes (Signed)
Pt awake, follows commands but unable to perform parameters for extubation. Multiple attempts at coaching unsuccessful. Medicated with Morphine for pain, will attempt again at a later time.

## 2015-02-20 ENCOUNTER — Inpatient Hospital Stay (HOSPITAL_COMMUNITY): Payer: Medicaid Other

## 2015-02-20 LAB — RENAL FUNCTION PANEL
ALBUMIN: 2 g/dL — AB (ref 3.5–5.2)
Anion gap: 6 (ref 5–15)
BUN: 39 mg/dL — ABNORMAL HIGH (ref 6–23)
CALCIUM: 8.5 mg/dL (ref 8.4–10.5)
CHLORIDE: 107 mmol/L (ref 96–112)
CO2: 25 mmol/L (ref 19–32)
CREATININE: 2.54 mg/dL — AB (ref 0.50–1.35)
GFR calc non Af Amer: 31 mL/min — ABNORMAL LOW (ref >90)
GFR, EST AFRICAN AMERICAN: 36 mL/min — AB (ref >90)
Glucose, Bld: 128 mg/dL — ABNORMAL HIGH (ref 70–99)
PHOSPHORUS: 7.3 mg/dL — AB (ref 2.3–4.6)
Potassium: 4.4 mmol/L (ref 3.5–5.1)
Sodium: 138 mmol/L (ref 135–145)

## 2015-02-20 LAB — GLUCOSE, CAPILLARY
GLUCOSE-CAPILLARY: 122 mg/dL — AB (ref 70–99)
GLUCOSE-CAPILLARY: 124 mg/dL — AB (ref 70–99)
GLUCOSE-CAPILLARY: 48 mg/dL — AB (ref 70–99)
Glucose-Capillary: 123 mg/dL — ABNORMAL HIGH (ref 70–99)
Glucose-Capillary: 151 mg/dL — ABNORMAL HIGH (ref 70–99)
Glucose-Capillary: 162 mg/dL — ABNORMAL HIGH (ref 70–99)
Glucose-Capillary: 50 mg/dL — ABNORMAL LOW (ref 70–99)
Glucose-Capillary: 75 mg/dL (ref 70–99)

## 2015-02-20 LAB — CBC
HCT: 28.6 % — ABNORMAL LOW (ref 39.0–52.0)
Hemoglobin: 9.5 g/dL — ABNORMAL LOW (ref 13.0–17.0)
MCH: 28.7 pg (ref 26.0–34.0)
MCHC: 33.2 g/dL (ref 30.0–36.0)
MCV: 86.4 fL (ref 78.0–100.0)
Platelets: 200 10*3/uL (ref 150–400)
RBC: 3.31 MIL/uL — ABNORMAL LOW (ref 4.22–5.81)
RDW: 14.9 % (ref 11.5–15.5)
WBC: 13 10*3/uL — ABNORMAL HIGH (ref 4.0–10.5)

## 2015-02-20 LAB — WOUND CULTURE: Culture: NO GROWTH

## 2015-02-20 MED ORDER — FUROSEMIDE 10 MG/ML IJ SOLN
80.0000 mg | Freq: Four times a day (QID) | INTRAMUSCULAR | Status: DC
  Administered 2015-02-20 – 2015-02-21 (×4): 80 mg via INTRAVENOUS
  Filled 2015-02-20 (×7): qty 8

## 2015-02-20 MED ORDER — DEXTROSE 50 % IV SOLN
INTRAVENOUS | Status: AC
  Administered 2015-02-20: 25 mL
  Filled 2015-02-20: qty 50

## 2015-02-20 MED ORDER — FUROSEMIDE 10 MG/ML IJ SOLN
40.0000 mg | Freq: Once | INTRAMUSCULAR | Status: AC
  Administered 2015-02-20: 40 mg via INTRAVENOUS
  Filled 2015-02-20: qty 4

## 2015-02-20 MED ORDER — ENOXAPARIN SODIUM 30 MG/0.3ML ~~LOC~~ SOLN
30.0000 mg | SUBCUTANEOUS | Status: DC
  Administered 2015-02-20 – 2015-02-21 (×2): 30 mg via SUBCUTANEOUS
  Filled 2015-02-20 (×3): qty 0.3

## 2015-02-20 NOTE — Progress Notes (Signed)
Up to chair most of this shift. Appears to be more alert, slow to respond, but, verbal. Left chest tube removed, Plerual & mediastinal remain. Lungs clear to diminshed, on room air 02 sats 98-100%. Will cont. To monitor.

## 2015-02-20 NOTE — Plan of Care (Signed)
Problem: Phase II - Intermediate Post-Op Goal: Activity Progressed Outcome: Not Met (add Reason) medilstinal & pluerual chest tubes remain in place.

## 2015-02-20 NOTE — Progress Notes (Signed)
Patient ID: Dwayne HickmanBenjamin [name], male   DOB: 03/13/1980, 35 y.o.   MRN: [id number] EVENING ROUNDS NOTE :     [address]       [place] [id number]             [phone]                 2 Days Post-Op Procedure(s) (LRB): MINIMALLY INVASIVE MITRAL VALVE (MV) REPAIR (Right) TRANSESOPHAGEAL ECHOCARDIOGRAM (TEE) (N/A)  Total Length of Stay:  LOS: 18 days  BP 158/93 mmHg  Pulse 81  Temp(Src) 97.8 F (36.6 C) (Oral)  Resp 16  Ht 6\' 2"  (1.88 m)  Wt 247 lb 12.8 oz (112.4 kg)  BMI 31.80 kg/m2  SpO2 96%  .Intake/Output      02/20 0701 - 02/21 0700 02/21 0701 - 02/22 0700   P.O. 480 1210   I.V. (mL/kg) 1577.7 (14) 247 (2.2)   Blood     NG/GT     IV Piggyback 550 200   Total Intake(mL/kg) 2607.7 (23.2) 1657 (14.7)   Urine (mL/kg/hr) 2250 (0.8) 2025 (1.5)   Emesis/NG output     Blood     Chest Tube 390 (0.1) 190 (0.1)   Total Output 2640 2215   Net -32.4 -558          . sodium chloride 20 mL/hr at 02/20/15 0700  . sodium chloride    . sodium chloride 20 mL/hr at 02/20/15 1000  . lactated ringers    . nitroGLYCERIN Stopped (02/20/15 1200)  . phenylephrine (NEO-SYNEPHRINE) Adult infusion Stopped (02/18/15 1630)     Lab Results  Component Value Date   WBC 13.0* 02/20/2015   HGB 9.5* 02/20/2015   HCT 28.6* 02/20/2015   PLT 200 02/20/2015   GLUCOSE 128* 02/20/2015   CHOL 174 03/16/2014   TRIG 139 03/16/2014   HDL 43 03/16/2014   LDLCALC 103* 03/16/2014   ALT 20 02/02/2015   AST 18 02/02/2015   NA 138 02/20/2015   K 4.4 02/20/2015   CL 107 02/20/2015   CREATININE 2.54* 02/20/2015   BUN 39* 02/20/2015   CO2 25 02/20/2015   TSH 2.046 02/04/2015   INR 1.32 02/18/2015   HGBA1C 6.8* 02/18/2015   uop good today, with elevated cr keeping foley in to monitor uop, d/c in am  Delight OvensEdward B Alin Chavira MD  Beeper [phone] Office [phone] 02/20/2015 6:58 PM

## 2015-02-20 NOTE — Progress Notes (Signed)
Patient ID: Dwayne [name], male   DOB: 02-02-1980, 35 y.o.   MRN: [id number] T[facility] DAILY ICU PROGRESS NOTE                   [address]            [name] [address]          [phone]   2 Days Post-Op Procedure(s) (LRB): MINIMALLY INVASIVE MITRAL VALVE (MV) REPAIR (Right) TRANSESOPHAGEAL ECHOCARDIOGRAM (TEE) (N/A)  Total Length of Stay:  LOS: 18 days   Subjective: Up to chair, not very involved with activity, answers direct questions   Objective: Vital signs in last 24 hours: Temp:  [97.3 F (36.3 C)-98.2 F (36.8 C)] 98.2 F (36.8 C) (02/21 0712) Pulse Rate:  [85-88] 87 (02/21 0740) Cardiac Rhythm:  [-] Normal sinus rhythm (02/21 1000) Resp:  [10-29] 17 (02/21 0700) BP: (121-147)/(66-90) 121/66 mmHg (02/21 0740) SpO2:  [98 %-100 %] 98 % (02/21 0700) Weight:  [247 lb 12.8 oz (112.4 kg)] 247 lb 12.8 oz (112.4 kg) (02/21 0700)  Filed Weights   02/17/15 0500 02/19/15 0430 02/20/15 0700  Weight: 252 lb 12.8 oz (114.669 kg) 246 lb 4.1 oz (111.7 kg) 247 lb 12.8 oz (112.4 kg)    Weight change: 1 lb 8.7 oz (0.7 kg)   Hemodynamic parameters for last 24 hours: PAP: (34-38)/(18-22) 38/22 mmHg  Intake/Output from previous day: 02/20 0701 - 02/21 0700 In: 2607.7 [P.O.:480; I.V.:1577.7; IV Piggyback:550] Out: 2640 [Urine:2250; Chest Tube:390]  Intake/Output this shift: Total I/O In: 897 [P.O.:850; I.V.:47] Out: 315 [Urine:225; Chest Tube:90]  Current Meds: Scheduled Meds: . acetaminophen  1,000 mg Oral 4 times per day   Or  . acetaminophen (TYLENOL) oral liquid 160 mg/5 mL  1,000 mg Per Tube 4 times per day  . ampicillin (OMNIPEN) IV  2 g Intravenous 6 times per day  . antiseptic oral rinse  7 mL Mouth Rinse QID  . aspirin EC  325 mg Oral Daily   Or  . aspirin  324 mg Per Tube Daily  . bisacodyl  10 mg Oral Daily   Or  . bisacodyl  10 mg Rectal Daily  . chlorhexidine  15 mL Mouth Rinse BID  . cloNIDine  0.2 mg Transdermal Weekly  . docusate  sodium  200 mg Oral Daily  . furosemide  80 mg Intravenous Q6H  . hydrALAZINE  25 mg Oral 3 times per day  . insulin aspart  0-24 Units Subcutaneous 6 times per day  . insulin detemir  25 Units Subcutaneous Daily  . metoprolol tartrate  50 mg Oral BID WC   Or  . metoprolol tartrate  50 mg Per Tube BID WC  . pantoprazole  40 mg Oral Daily  . sodium chloride  3 mL Intravenous Q12H   Continuous Infusions: . sodium chloride 20 mL/hr at 02/20/15 0700  . sodium chloride    . sodium chloride 20 mL/hr at 02/20/15 0800  . lactated ringers    . nitroGLYCERIN 60 mcg/min (02/20/15 1000)  . phenylephrine (NEO-SYNEPHRINE) Adult infusion Stopped (02/18/15 1630)   PRN Meds:.labetalol, metoprolol, midazolam, morphine injection, ondansetron (ZOFRAN) IV, oxyCODONE, sodium chloride, traMADol  General appearance: alert and uncooperative Neurologic: intact Heart: regular rate and rhythm, S1, S2 normal, no murmur, click, rub or gallop Lungs: diminished breath sounds bibasilar Abdomen: soft, non-tender; bowel sounds normal; no masses,  no organomegaly Extremities: edema bilaterial pedal edema Wound: intact, no air leak from chest tubes   Lab  Results: CBC: Recent Labs  02/19/15 1657 02/20/15 0345  WBC 14.8* 13.0*  HGB 10.0* 9.5*  HCT 30.1* 28.6*  PLT 202 200   BMET:  Recent Labs  02/19/15 0404 02/19/15 1645 02/19/15 1657 02/20/15 0345  NA 139 143  --  138  K 4.1 3.6  --  4.4  CL 110 110  --  107  CO2 23  --   --  25  GLUCOSE 118* 136*  --  128*  BUN 31* 30*  --  39*  CREATININE 2.09* 1.90* 1.92* 2.54*  CALCIUM 8.0*  --   --  8.5    PT/INR:  Recent Labs  02/18/15 1630  LABPROT 16.5*  INR 1.32   Radiology: Dg Chest Port 1 View  02/19/2015   CLINICAL DATA:  Atelectasis  EXAM: PORTABLE CHEST - 1 VIEW  COMPARISON:  02/18/2015  FINDINGS: The endotracheal tube and nasogastric tube have been removed. The left subclavian Swan-Ganz catheter remains. The left jugular sheath remains. The  right chest tube remains. The left chest tube remains. Trace apical pneumothorax may be present on the right. No significant pneumothorax is evident. Minor basilar opacities are present, right greater than left, likely atelectatic. Heart size and contours unchanged.  IMPRESSION: Minor atelectatic appearing basilar opacities without significant interval change. Trace right apical pneumothorax.   Electronically Signed   By: Dwayne [name] M.D.   On: 02/19/2015 06:34   Chronic Kidney Disease   Stage I     GFR >90  Stage II    GFR 60-89  Stage IIIA GFR 45-59  Stage IIIB GFR 30-44  Stage IV   GFR 15-29  Stage V    GFR  <15  Lab Results  Component Value Date   CREATININE 2.54* 02/20/2015   Estimated Creatinine Clearance: 54.7 mL/min (by C-G formula based on Cr of 2.54).   Assessment/Plan: S/P Procedure(s) (LRB): MINIMALLY INVASIVE MITRAL VALVE (MV) REPAIR (Right) TRANSESOPHAGEAL ECHOCARDIOGRAM (TEE) (N/A) Mobilize Diuresis d/c tubes/lines Continue foley due to strict I&O, patient in ICU and urinary output monitoring D/c a line bp better controlled  Cr up to 2.54    Dwayne [name] B 02/20/2015 11:00 AM

## 2015-02-20 NOTE — Progress Notes (Signed)
Subjective:  S/P mitral valve repair on 2/19- making urine, over 2 liters on lasix- creatinine did trend up however, blood pressure was higher but now down , taking many meds- remains very flat and mean according to nursing  Objective Vital signs in last 24 hours: Filed Vitals:   02/20/15 0600 02/20/15 0700 02/20/15 0712 02/20/15 0740  BP: 127/75   121/66  Pulse: 87 88  87  Temp:   98.2 F (36.8 C)   TempSrc:   Oral   Resp: 15 17    Height:      Weight:  112.4 kg (247 lb 12.8 oz)    SpO2: 100% 98%     Weight change: 0.7 kg (1 lb 8.7 oz)  Intake/Output Summary (Last 24 hours) at 02/20/15 0801 Last data filed at 02/20/15 0700  Gross per 24 hour  Intake 2501.65 ml  Output   2490 ml  Net  11.65 ml    Assessment/Plan: 35 year old black male with history of poorly controlled hypertension and diabetes - diagnosed last month with MSSA bacteremia and 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  1.Renal-  baseline renal insufficiency and CKD on the basis of his DM and HTN.  Had acute on chronic renal failure earlier in the hospitalization as a result of relative hypotension and decreased renal perfusion. It improved pre op and now is post op from mitral valve repair 2/19.  Is making urine but creatinine going up. I am going to increase scheduled lasix to 80 IV QID to assist with his volume overload , may help his blood pressure as well. On hydralazine 25 TID- CTS added clonidine patch yest and also on nitro drip 2. Hypertension/volume - he has massive edema. Not sure how much this has to do with his mitral regurg, he has hypoalbuminemia which is going to make him more prone to edema and is going to be difficult fix at this juncture.   Diurese as able- will increase lasix-  trying not to drop blood pressure too much-  3. Anemia - is not helping his  volume status either, have added ESA to help boost- appears to have been transfused   [employer] [facility]    Labs: Basic Metabolic Panel:  Recent Labs Lab 02/16/15 0405 02/17/15 0455 02/18/15 0341  02/19/15 0404 02/19/15 1645 02/19/15 1657 02/20/15 0345  NA 139 138 140  < > 139 143  --  138  K 3.7 3.5 3.7  < > 4.1 3.6  --  4.4  CL 104 104 103  < > 110 110  --  107  CO2 32 29 31  --  23  --   --  25  GLUCOSE 190* 157* 187*  < > 118* 136*  --  128*  BUN 38* 31* 27*  < > 31* 30*  --  39*  CREATININE 2.60* 2.19* 2.15*  < > 2.09* 1.90* 1.92* 2.54*  CALCIUM 8.7 8.6 9.0  --  8.0*  --   --  8.5  PHOS 5.5* 4.9*  --   --   --   --   --  7.3*  < > = values in this interval not displayed. Liver Function Tests:  Recent Labs Lab 02/16/15 0405 02/17/15 0455 02/20/15 0345  ALBUMIN 1.9* 1.9* 2.0*   No results for input(s): LIPASE, AMYLASE in the  last 168 hours. No results for input(s): AMMONIA in the last 168 hours. CBC:  Recent Labs Lab 02/18/15 1630  02/18/15 2219 02/19/15 0404 02/19/15 1645 02/19/15 1657 02/20/15 0345  WBC 10.6*  --  12.0* 11.4*  --  14.8* 13.0*  HGB 11.4*  < > 10.5* 10.0* 10.9* 10.0* 9.5*  HCT 34.1*  < > 31.2* 29.7* 32.0* 30.1* 28.6*  MCV 86.1  --  83.6 84.4  --  87.0 86.4  PLT 169  --  183 189  --  202 200  < > = values in this interval not displayed. Cardiac Enzymes: No results for input(s): CKTOTAL, CKMB, CKMBINDEX, TROPONINI in the last 168 hours. CBG:  Recent Labs Lab 02/19/15 1433 02/19/15 1544 02/19/15 2010 02/19/15 2349 02/20/15 0354  GLUCAP 139* 141* 163* 162* 124*    Iron Studies: No results for input(s): IRON, TIBC, TRANSFERRIN, FERRITIN in the last 72 hours. Studies/Results: Dg Chest Port 1 View  02/20/2015   CLINICAL DATA:  Mitral valvuloplasty  EXAM: PORTABLE CHEST - 1 VIEW  COMPARISON:  02/19/2015  FINDINGS: There is a right chest tube. There is a left base chest tube. There is a left jugular sheath. The Swan-Ganz catheter  has been removed. There is a trace right apical pneumothorax. Mild basilar atelectatic changes are present on the right.  IMPRESSION: Trace right apical pneumothorax. Mild unchanged right basilar atelectatic changes.   Electronically Signed   By: Ellery Plunkaniel R Mitchell M.D.   On: 02/20/2015 06:19   Dg Chest Port 1 View  02/19/2015   CLINICAL DATA:  Atelectasis  EXAM: PORTABLE CHEST - 1 VIEW  COMPARISON:  02/18/2015  FINDINGS: The endotracheal tube and nasogastric tube have been removed. The left subclavian Swan-Ganz catheter remains. The left jugular sheath remains. The right chest tube remains. The left chest tube remains. Trace apical pneumothorax may be present on the right. No significant pneumothorax is evident. Minor basilar opacities are present, right greater than left, likely atelectatic. Heart size and contours unchanged.  IMPRESSION: Minor atelectatic appearing basilar opacities without significant interval change. Trace right apical pneumothorax.   Electronically Signed   By: Ellery Plunkaniel R Mitchell M.D.   On: 02/19/2015 06:34   Dg Chest Port 1 View  02/18/2015   CLINICAL DATA:  Post open heart surgery, atelectasis, follow-up  EXAM: PORTABLE CHEST - 1 VIEW  COMPARISON:  Portable exam 1740 hours compared to 02/11/2015  FINDINGS: Tip of endotracheal tube projects approximately 4.5 cm above carina.  Nasogastric tube extends into stomach.  LEFT jugular central venous catheter projects over confluence of LEFT brachiocephalic vein.  RIGHT thoracostomy tube and mediastinal drain present.  LEFT subclavian Swan-Ganz catheter tip projects over RIGHT pulmonary artery.  Epicardial pacing wires identified.  Enlargement of cardiac silhouette.  Mediastinal contours and pulmonary vascularity normal.  Lungs clear.  No infiltrate, pleural effusion or pneumothorax.  IMPRESSION: Postoperative changes without acute abnormalities.   Electronically Signed   By: Ulyses SouthwardMark  Boles M.D.   On: 02/18/2015 17:47   Medications: Infusions: .  sodium chloride 20 mL/hr at 02/20/15 0700  . sodium chloride    . sodium chloride 20 mL/hr at 02/20/15 0700  . lactated ringers    . nitroGLYCERIN 90 mcg/min (02/20/15 0700)  . phenylephrine (NEO-SYNEPHRINE) Adult infusion Stopped (02/18/15 1630)    Scheduled Medications: . acetaminophen  1,000 mg Oral 4 times per day   Or  . acetaminophen (TYLENOL) oral liquid 160 mg/5 mL  1,000 mg Per Tube 4 times per day  . ampicillin (  OMNIPEN) IV  2 g Intravenous 6 times per day  . antiseptic oral rinse  7 mL Mouth Rinse QID  . aspirin EC  325 mg Oral Daily   Or  . aspirin  324 mg Per Tube Daily  . bisacodyl  10 mg Oral Daily   Or  . bisacodyl  10 mg Rectal Daily  . cefUROXime (ZINACEF)  IV  1.5 g Intravenous Q12H  . chlorhexidine  15 mL Mouth Rinse BID  . cloNIDine  0.2 mg Transdermal Weekly  . docusate sodium  200 mg Oral Daily  . furosemide  40 mg Intravenous Q6H  . hydrALAZINE  25 mg Oral 3 times per day  . insulin aspart  0-24 Units Subcutaneous 6 times per day  . insulin detemir  25 Units Subcutaneous Daily  . metoprolol tartrate  50 mg Oral BID WC   Or  . metoprolol tartrate  50 mg Per Tube BID WC  . pantoprazole  40 mg Oral Daily  . sodium chloride  3 mL Intravenous Q12H    have reviewed scheduled and prn medications.  Physical Exam: General: in ICU with chest tubes Heart: RRR Lungs: poor effort Abdomen: soft, non tender Extremities: pitting edema to thighs    02/20/2015,8:01 AM  LOS: 18 days

## 2015-02-20 NOTE — Progress Notes (Signed)
ANTIBIOTIC CONSULT NOTE - FOLLOW UP  Pharmacy Consult for ampicillin Indication: endocarditis  No Known Allergies  Patient Measurements: Height:  (188 cm) Weight: 247 lb 12.8 oz (112.4 kg) IBW/kg (Calculated) : 82.2  Vital Signs: Temp: 97.9 F (36.6 C) (02/21 1138) Temp Source: Oral (02/21 1138) BP: 126/72 mmHg (02/21 1000) Pulse Rate: 80 (02/21 1000) Intake/Output from previous day: 02/20 0701 - 02/21 0700 In: 2607.7 [P.O.:480; I.V.:1577.7; IV Piggyback:550] Out: 2640 [Urine:2250; Chest Tube:390] Intake/Output from this shift: Total I/O In: 997 [P.O.:850; I.V.:147] Out: 585 [Urine:475; Chest Tube:110]  Labs:  Recent Labs  02/19/15 0404 02/19/15 1645 02/19/15 1657 02/20/15 0345  WBC 11.4*  --  14.8* 13.0*  HGB 10.0* 10.9* 10.0* 9.5*  PLT 189  --  202 200  CREATININE 2.09* 1.90* 1.92* 2.54*   Estimated Creatinine Clearance: 54.7 mL/min (by C-G formula based on Cr of 2.54). No results for input(s): VANCOTROUGH, VANCOPEAK, VANCORANDOM, GENTTROUGH, GENTPEAK, GENTRANDOM, TOBRATROUGH, TOBRAPEAK, TOBRARND, AMIKACINPEAK, AMIKACINTROU, AMIKACIN in the last 72 hours.   Microbiology: Recent Results (from the past 720 hour(s))  Surgical pcr screen     Status: None   Collection Time: 01/25/15  4:47 AM  Result Value Ref Range Status   MRSA, PCR NEGATIVE NEGATIVE Final   Staphylococcus aureus NEGATIVE NEGATIVE Final    Comment:        The Xpert SA Assay (FDA approved for NASAL specimens in patients over 61 years of age), is one component of a comprehensive surveillance program.  Test performance has been validated by [facility] for patients greater than or equal to 2 year old. It is not intended to diagnose infection nor to guide or monitor treatment.   Culture, blood (routine x 2)     Status: None   Collection Time: 02/02/15  3:53 PM  Result Value Ref Range Status   Specimen Description BLOOD PICC LINE  Final   Special Requests BOTTLES DRAWN AEROBIC AND  ANAEROBIC  Final   Culture   Final    NO GROWTH 5 DAYS Performed at [facility]    Report Status 02/08/2015 FINAL  Final  Culture, blood (routine x 2)     Status: None   Collection Time: 02/02/15  4:32 PM  Result Value Ref Range Status   Specimen Description BLOOD PICC LINE  Final   Special Requests BOTTLES DRAWN AEROBIC AND ANAEROBIC 10CC  Final   Culture   Final    NO GROWTH 5 DAYS Performed at [facility]    Report Status 02/08/2015 FINAL  Final  MRSA PCR Screening     Status: None   Collection Time: 02/02/15  5:14 PM  Result Value Ref Range Status   MRSA by PCR NEGATIVE NEGATIVE Final    Comment:        The GeneXpert MRSA Assay (FDA approved for NASAL specimens only), is one component of a comprehensive MRSA colonization surveillance program. It is not intended to diagnose MRSA infection nor to guide or monitor treatment for MRSA infections.   Body fluid culture     Status: None   Collection Time: 02/04/15  5:45 PM  Result Value Ref Range Status   Specimen Description SYNOVIAL FLUID SHOULDER RIGHT  Final   Special Requests NONE  Final   Gram Stain   Final    FEW WBC PRESENT, PREDOMINANTLY PMN NO ORGANISMS SEEN Performed at [facility]    Culture   Final    NO GROWTH 3 DAYS Performed at  [facility]    Report Status 02/08/2015 FINAL  Final  Surgical pcr screen     Status: None   Collection Time: 02/05/15  5:06 AM  Result Value Ref Range Status   MRSA, PCR NEGATIVE NEGATIVE Final   Staphylococcus aureus NEGATIVE NEGATIVE Final    Comment:        The Xpert SA Assay (FDA approved for NASAL specimens in patients over 69 years of age), is one component of a comprehensive surveillance program.  Test performance has been validated by [facility] for patients greater than or equal to 70 year old. It is not intended to diagnose infection nor to guide or monitor treatment.   Gram stain     Status: None   Collection Time:  02/18/15 10:21 AM  Result Value Ref Range Status   Specimen Description FLUID PERITONEAL  Final   Special Requests FLUID ON SWAB PT ON ZEDOCEF AND VANCOMYCIN  Final   Gram Stain   Final    RARE WBC PRESENT, PREDOMINANTLY MONONUCLEAR NO ORGANISMS SEEN    Report Status 02/18/2015 FINAL  Final  Body fluid culture     Status: None (Preliminary result)   Collection Time: 02/18/15 10:24 AM  Result Value Ref Range Status   Specimen Description FLUID PERITONEAL  Final   Special Requests FLUID ON SWAB PT ON ZEDOCEF AND VANCOMYCIN  Final   Gram Stain   Final    RARE WBC PRESENT, PREDOMINANTLY MONONUCLEAR NO ORGANISMS SEEN Performed at [facility] Performed at [facility]    Culture   Final    NO GROWTH 1 DAY Performed at [facility]    Report Status PENDING  Incomplete  Anaerobic culture     Status: None (Preliminary result)   Collection Time: 02/18/15 11:38 AM  Result Value Ref Range Status   Specimen Description WOUND OTHER  Final   Special Requests MITRAL VALVE/PT ON ZEDOCEF VANCOMYCIN  Final   Gram Stain   Final    NO WBC SEEN NO SQUAMOUS EPITHELIAL CELLS SEEN NO ORGANISMS SEEN Performed at [facility]    Culture   Final    NO ANAEROBES ISOLATED; CULTURE IN PROGRESS FOR 5 DAYS Performed at [facility]    Report Status PENDING  Incomplete  Gram stain     Status: None   Collection Time: 02/18/15 11:38 AM  Result Value Ref Range Status   Specimen Description WOUND OTHER  Final   Special Requests MITRAL VALVE/PT ON ZEDOCEF VANCOMYCIN  Final   Gram Stain   Final    RARE WBC PRESENT, PREDOMINANTLY MONONUCLEAR NO ORGANISMS SEEN    Report Status 02/18/2015 FINAL  Final  Wound culture     Status: None   Collection Time: 02/18/15 11:38 AM  Result Value Ref Range Status   Specimen Description WOUND OTHER  Final   Special Requests MITRAL VALVE  Final   Gram Stain   Final    WBC PRESENT, PREDOMINANTLY MONONUCLEAR NO ORGANISMS  SEEN Performed at [facility] Performed at [facility]    Culture   Final    NO GROWTH 2 DAYS Performed at [facility]    Report Status 02/20/2015 FINAL  Final  Tissue culture     Status: None (Preliminary result)   Collection Time: 02/18/15 11:53 AM  Result Value Ref Range Status   Specimen Description TISSUE OTHER  Final   Special Requests MITRAL VALVE LEAFLET POSTERIOR  Final   Gram  Stain   Final    NO WBC SEEN NO ORGANISMS SEEN [facility]    Culture   Final    NO GROWTH 2 DAYS [facility]    Report Status PENDING  Incomplete    Anti-infectives    Start     Dose/Rate Route Frequency Ordered Stop   02/18/15 2330  vancomycin (VANCOCIN) IVPB 1000 mg/200 mL premix     1,000 mg 200 mL/hr over 60 Minutes Intravenous  Once 02/18/15 1630 02/19/15 0005   02/18/15 2200  cefUROXime (ZINACEF) 1.5 g in dextrose 5 % 50 mL IVPB     1.5 g 100 mL/hr over 30 Minutes Intravenous Every 12 hours 02/18/15 1630 02/20/15 1031   02/18/15 0947  bacitracin 50,000 Units in sodium chloride irrigation 0.9 % 500 mL irrigation  Status:  Discontinued       As needed 02/18/15 0948 02/18/15 1617   02/18/15 0400  vancomycin (VANCOCIN) 1,500 mg in sodium chloride 0.9 % 250 mL IVPB     1,500 mg 125 mL/hr over 120 Minutes Intravenous To Surgery 02/17/15 1256 02/18/15 0909   02/18/15 0400  cefUROXime (ZINACEF) 1.5 g in dextrose 5 % 50 mL IVPB     1.5 g 100 mL/hr over 30 Minutes Intravenous To Surgery 02/17/15 1256 02/18/15 1403   02/18/15 0400  cefUROXime (ZINACEF) 750 mg in dextrose 5 % 50 mL IVPB  Status:  Discontinued     750 mg 100 mL/hr over 30 Minutes Intravenous To Surgery 02/17/15 1257 02/18/15 1624   02/18/15 0400  vancomycin (VANCOCIN) 1,000 mg in sodium chloride 0.9 % 1,000 mL irrigation      Irrigation To Surgery 02/17/15 1257 02/18/15 0945   02/16/15 1200  ampicillin (OMNIPEN) 2 g in sodium chloride 0.9 % 50 mL IVPB     2  g 150 mL/hr over 20 Minutes Intravenous 6 times per day 02/16/15 1024     02/13/15 1800  ampicillin (OMNIPEN) 2 g in sodium chloride 0.9 % 50 mL IVPB  Status:  Discontinued     2 g 150 mL/hr over 20 Minutes Intravenous 4 times per day 02/13/15 1309 02/16/15 1024   02/02/15 1700  ampicillin (OMNIPEN) 2 g in sodium chloride 0.9 % 50 mL IVPB  Status:  Discontinued     2 g 150 mL/hr over 20 Minutes Intravenous Every 4 hours 02/02/15 1638 02/13/15 1309      Assessment: 35 yo M presents on 2/3 with CP. 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 Pharmacy to continue dosing ampicillin for endocarditis with perforation, septic emboli to the brain and right septic shoulder and thoracic spine discitis. Now s/p arthroscopic debridement and subacromial decompression for septic right shoulder.  S/p mitral valve replacement on 2/19  ID recommends 6 weeks of abx with day 1 being 1/14.  Scr continues to increase to 2.54 today (est crcl 44-55), UOP 0.8 mL/kg/hr.  1/14 ampicillin (6wks)>>  2/3 bcx: neg FINAL 2/5 Synovial fluid: neg FINAL 2/19 peritoneal fluid>>ngtd 2/19 mitral valve wound>>neg FINAl 2/19 MV tissue>> ngtd 2/19 anaerobic MV tissue>>ngtd  Plan:  - continue ampicillin 2g IV q4h for now - will monitor renal function closely and adjust dose when appropriate  - f/u cultures   Maisey Deandrade P 02/20/2015,1:52 PM

## 2015-02-20 NOTE — Progress Notes (Signed)
Anesthesiology Follow-up:  Awake and alert, sitting in chair. Has had hypertension since returning from OR, PO hydralazine added, receiving 75 mg BID PO Lopressor as well  VS: T-36.8 BP-154/81 HR- 87 (SR) RR-15 O2 Sat 98%% on RA  K-3.8 glucose 148 BUN/CR. 39/2.54 H/H 9,5/28.6 Platelets 200,000  Extubated 9 1/2 hours post-op.  Overall doing well. 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 Cr. Increased from 1.94 yesterday to 2.54 today. On lasix 40 mg IV QID.   Dwayne [name]

## 2015-02-20 NOTE — Progress Notes (Addendum)
Aline and chest removed per order. Patient tolerated well. Morphine 4 mg given for pain prior to chest tube removal. Currently resting in bed.

## 2015-02-21 ENCOUNTER — Inpatient Hospital Stay (HOSPITAL_COMMUNITY): Payer: Medicaid Other

## 2015-02-21 LAB — TYPE AND SCREEN
ABO/RH(D): B POS
Antibody Screen: NEGATIVE
UNIT DIVISION: 0
UNIT DIVISION: 0
Unit division: 0
Unit division: 0

## 2015-02-21 LAB — RENAL FUNCTION PANEL
ALBUMIN: 1.9 g/dL — AB (ref 3.5–5.2)
ANION GAP: 6 (ref 5–15)
BUN: 44 mg/dL — ABNORMAL HIGH (ref 6–23)
CO2: 27 mmol/L (ref 19–32)
Calcium: 8.3 mg/dL — ABNORMAL LOW (ref 8.4–10.5)
Chloride: 103 mmol/L (ref 96–112)
Creatinine, Ser: 2.63 mg/dL — ABNORMAL HIGH (ref 0.50–1.35)
GFR calc Af Amer: 35 mL/min — ABNORMAL LOW (ref >90)
GFR calc non Af Amer: 30 mL/min — ABNORMAL LOW (ref >90)
Glucose, Bld: 106 mg/dL — ABNORMAL HIGH (ref 70–99)
Phosphorus: 6 mg/dL — ABNORMAL HIGH (ref 2.3–4.6)
Potassium: 3.9 mmol/L (ref 3.5–5.1)
Sodium: 136 mmol/L (ref 135–145)

## 2015-02-21 LAB — GLUCOSE, CAPILLARY
GLUCOSE-CAPILLARY: 132 mg/dL — AB (ref 70–99)
GLUCOSE-CAPILLARY: 145 mg/dL — AB (ref 70–99)
GLUCOSE-CAPILLARY: 159 mg/dL — AB (ref 70–99)
GLUCOSE-CAPILLARY: 47 mg/dL — AB (ref 70–99)
GLUCOSE-CAPILLARY: 84 mg/dL (ref 70–99)
Glucose-Capillary: 83 mg/dL (ref 70–99)
Glucose-Capillary: 128 mg/dL — ABNORMAL HIGH (ref 70–99)

## 2015-02-21 LAB — CBC
HCT: 28.7 % — ABNORMAL LOW (ref 39.0–52.0)
Hemoglobin: 9.3 g/dL — ABNORMAL LOW (ref 13.0–17.0)
MCH: 27.8 pg (ref 26.0–34.0)
MCHC: 32.4 g/dL (ref 30.0–36.0)
MCV: 85.9 fL (ref 78.0–100.0)
Platelets: 163 10*3/uL (ref 150–400)
RBC: 3.34 MIL/uL — ABNORMAL LOW (ref 4.22–5.81)
RDW: 14.5 % (ref 11.5–15.5)
WBC: 10 10*3/uL (ref 4.0–10.5)

## 2015-02-21 LAB — PROTIME-INR
INR: 1.2 (ref 0.00–1.49)
Prothrombin Time: 15.3 seconds — ABNORMAL HIGH (ref 11.6–15.2)

## 2015-02-21 MED ORDER — WARFARIN SODIUM 2.5 MG PO TABS
2.5000 mg | ORAL_TABLET | Freq: Every day | ORAL | Status: DC
  Administered 2015-02-21 – 2015-02-22 (×2): 2.5 mg via ORAL
  Filled 2015-02-21 (×3): qty 1

## 2015-02-21 MED ORDER — FUROSEMIDE 10 MG/ML IJ SOLN
80.0000 mg | Freq: Two times a day (BID) | INTRAMUSCULAR | Status: AC
  Administered 2015-02-21: 80 mg via INTRAVENOUS

## 2015-02-21 MED ORDER — SODIUM CHLORIDE 0.9 % IJ SOLN
3.0000 mL | Freq: Two times a day (BID) | INTRAMUSCULAR | Status: DC
  Administered 2015-02-21: 3 mL via INTRAVENOUS

## 2015-02-21 MED ORDER — METOPROLOL TARTRATE 50 MG PO TABS
50.0000 mg | ORAL_TABLET | Freq: Two times a day (BID) | ORAL | Status: DC
  Administered 2015-02-21 – 2015-02-22 (×4): 50 mg via ORAL
  Filled 2015-02-21 (×6): qty 1

## 2015-02-21 MED ORDER — MOVING RIGHT ALONG BOOK
Freq: Once | Status: AC
  Administered 2015-02-21: 09:00:00
  Filled 2015-02-21: qty 1

## 2015-02-21 MED ORDER — SODIUM CHLORIDE 0.9 % IV SOLN: 250.0000 mL | INTRAVENOUS | Status: DC | PRN

## 2015-02-21 MED ORDER — FUROSEMIDE 40 MG PO TABS
40.0000 mg | ORAL_TABLET | Freq: Two times a day (BID) | ORAL | Status: DC
Start: 2015-02-22 — End: 2015-02-23
  Administered 2015-02-22 – 2015-02-23 (×3): 40 mg via ORAL
  Filled 2015-02-21 (×5): qty 1

## 2015-02-21 MED ORDER — WARFARIN - PHYSICIAN DOSING INPATIENT
Freq: Every day | Status: DC
  Administered 2015-02-21 – 2015-02-25 (×2)

## 2015-02-21 MED ORDER — MORPHINE SULFATE 2 MG/ML IJ SOLN
2.0000 mg | INTRAMUSCULAR | Status: DC | PRN
  Administered 2015-02-21 – 2015-02-24 (×3): 2 mg via INTRAVENOUS
  Filled 2015-02-21 (×3): qty 1

## 2015-02-21 MED ORDER — SODIUM CHLORIDE 0.9 % IJ SOLN: 3.0000 mL | INTRAMUSCULAR | Status: DC | PRN

## 2015-02-21 MED ORDER — INSULIN ASPART 100 UNIT/ML ~~LOC~~ SOLN
0.0000 [IU] | Freq: Three times a day (TID) | SUBCUTANEOUS | Status: DC
  Administered 2015-02-21 – 2015-02-22 (×4): 2 [IU] via SUBCUTANEOUS
  Administered 2015-02-22 (×2): 4 [IU] via SUBCUTANEOUS
  Administered 2015-02-22: 2 [IU] via SUBCUTANEOUS
  Administered 2015-02-23: 8 [IU] via SUBCUTANEOUS
  Administered 2015-02-23: 2 [IU] via SUBCUTANEOUS
  Administered 2015-02-24 (×2): 4 [IU] via SUBCUTANEOUS
  Administered 2015-02-24: 2 [IU] via SUBCUTANEOUS
  Administered 2015-02-24 – 2015-02-25 (×3): 4 [IU] via SUBCUTANEOUS
  Administered 2015-02-25: 2 [IU] via SUBCUTANEOUS

## 2015-02-21 MED FILL — Sodium Bicarbonate IV Soln 8.4%: INTRAVENOUS | Qty: 50 | Status: AC

## 2015-02-21 MED FILL — Heparin Sodium (Porcine) Inj 1000 Unit/ML: INTRAMUSCULAR | Qty: 20 | Status: AC

## 2015-02-21 MED FILL — Potassium Chloride Inj 2 mEq/ML: INTRAVENOUS | Qty: 40 | Status: AC

## 2015-02-21 MED FILL — Electrolyte-R (PH 7.4) Solution: INTRAVENOUS | Qty: 4000 | Status: AC

## 2015-02-21 MED FILL — Lidocaine HCl IV Inj 20 MG/ML: INTRAVENOUS | Qty: 5 | Status: AC

## 2015-02-21 MED FILL — Magnesium Sulfate Inj 50%: INTRAMUSCULAR | Qty: 10 | Status: AC

## 2015-02-21 MED FILL — Heparin Sodium (Porcine) Inj 1000 Unit/ML: INTRAMUSCULAR | Qty: 30 | Status: AC

## 2015-02-21 MED FILL — Sodium Chloride IV Soln 0.9%: INTRAVENOUS | Qty: 3000 | Status: AC

## 2015-02-21 MED FILL — Mannitol IV Soln 20%: INTRAVENOUS | Qty: 500 | Status: AC

## 2015-02-21 NOTE — Progress Notes (Signed)
Patient ID: Dwayne HickmanBenjamin [name], male   DOB: 11/24/1980, 35 y.o.   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 acetaminophen  1,000 mg Oral 4 times per day  . ampicillin (OMNIPEN) IV  2 g Intravenous 6 times per day  . antiseptic oral rinse  7 mL Mouth Rinse QID  . aspirin EC  325 mg Oral Daily  . bisacodyl  10 mg Oral Daily   Or  . bisacodyl  10 mg Rectal Daily  . cloNIDine  0.2 mg Transdermal Weekly  . docusate sodium  200 mg Oral Daily  . enoxaparin (LOVENOX) injection  30 mg Subcutaneous Q24H  . furosemide  80 mg Intravenous BID  . [START ON 02/22/2015] furosemide  40 mg Oral BID  . hydrALAZINE  25 mg Oral 3 times per day  . insulin aspart  0-24 Units Subcutaneous TID AC & HS  . metoprolol tartrate  50 mg Oral BID  . pantoprazole  40 mg Oral Daily  . sodium chloride  3 mL Intravenous Q12H  . sodium chloride  3 mL Intravenous Q12H  . warfarin  2.5 mg Oral q1800  . Warfarin - Physician Dosing Inpatient   Does not apply q1800    Subjective: He states that he is doing "okay".  Review of Systems: Pertinent items are noted in HPI.  Past Medical History  Diagnosis Date  . Hypertension   . Acute renal failure   . Bacteremia     MSSA   . Diabetes type 2, uncontrolled 01/09/2015  . 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 Essential hypertension, benign 01/18/2014  . Infective myositis  of left thigh   . Noncompliance 01/18/2014  . Septic shock   . Staphylococcus aureus bacteremia with sepsis   . Tobacco abuse 01/15/2014  . Mitral regurgitation 01/13/2015    Severe by TEE  . MSSA (methicillin susceptible Staphylococcus aureus)   . Type II diabetes mellitus     not controlled  . Diabetic foot infection 01/15/2014  . S/P minimally-invasive mitral valve repair 02/18/2015    Complex valvuloplasty including bovine pericardial patch repair of perforation of posterior leaflet with 28 mm Sorin Memo 3D ring annuloplasty via right mini thoracotomy approach     History  Substance Use Topics  . Smoking status: Former Smoker -- 12 years    Types: Cigars    Quit date: 01/09/2015  . Smokeless tobacco: Never Used  . Alcohol Use: Yes     Comment: 02/02/2015 "I'll have 2-3 mixed drinks 2-3 times/yr"    History reviewed. No pertinent family history. No Known Allergies  OBJECTIVE: Blood pressure 128/69, pulse 86, temperature 99.7 F (37.6 C), temperature source Oral, resp. rate 11, height 6\' 2"  (1.88 m), weight 260 lb 9.3 oz (118.2 kg), SpO2 98 %. General:  He is alert and in no distress. He sitting up in a chair. Lungs: Clear Cor: Regular S1 and S2 with no murmur   Lab Results Lab Results  Component Value Date   WBC 10.0 02/21/2015   HGB 9.3* 02/21/2015   HCT 28.7* 02/21/2015   MCV 85.9 02/21/2015   PLT 163 02/21/2015    Lab Results  Component Value Date   CREATININE 2.63* 02/21/2015   BUN 44* 02/21/2015   NA 136 02/21/2015   K 3.9 02/21/2015   CL 103 02/21/2015   CO2 27 02/21/2015    Lab Results  Component Value Date   ALT 20 02/02/2015   AST 18 02/02/2015   ALKPHOS 112 02/02/2015   BILITOT 0.3 02/02/2015     Microbiology: Recent Results (from the past 240 hour(s))  Gram stain     Status: None   Collection Time: 02/18/15 10:21 AM  Result Value Ref Range Status   Specimen Description FLUID PERITONEAL  Final   Special Requests FLUID ON SWAB PT ON ZEDOCEF AND  VANCOMYCIN  Final   Gram Stain   Final    RARE WBC PRESENT, PREDOMINANTLY MONONUCLEAR NO ORGANISMS SEEN    Report Status 02/18/2015 FINAL  Final  Body fluid culture     Status: None (Preliminary result)   Collection Time: 02/18/15 10:24 AM  Result Value Ref Range Status   Specimen Description FLUID PERITONEAL  Final   Special Requests FLUID ON SWAB PT ON ZEDOCEF AND VANCOMYCIN  Final   Gram Stain   Final    RARE WBC PRESENT, PREDOMINANTLY MONONUCLEAR NO ORGANISMS SEEN Performed at [facility] Performed at [facility]    Culture   Final    NO GROWTH 1 DAY Performed at [facility]    Report Status PENDING  Incomplete  Anaerobic culture     Status: None (Preliminary result)   Collection Time: 02/18/15 11:38 AM  Result Value Ref Range Status   Specimen Description WOUND OTHER  Final   Special Requests MITRAL VALVE/PT ON ZEDOCEF VANCOMYCIN  Final   Gram Stain   Final    NO WBC SEEN NO SQUAMOUS EPITHELIAL CELLS SEEN NO ORGANISMS SEEN Performed at [facility]    Culture   Final    NO ANAEROBES ISOLATED; CULTURE IN PROGRESS FOR 5 DAYS Performed at [facility]    Report Status PENDING  Incomplete  Gram stain     Status: None   Collection Time: 02/18/15 11:38 AM  Result Value Ref Range Status   Specimen Description WOUND OTHER  Final   Special Requests MITRAL VALVE/PT ON ZEDOCEF VANCOMYCIN  Final   Gram Stain   Final    RARE WBC PRESENT, PREDOMINANTLY MONONUCLEAR NO ORGANISMS SEEN    Report Status 02/18/2015 FINAL  Final  Wound culture     Status: None   Collection Time: 02/18/15 11:38 AM  Result Value Ref Range Status   Specimen Description WOUND OTHER  Final   Special Requests MITRAL VALVE  Final   Gram Stain   Final    WBC PRESENT, PREDOMINANTLY MONONUCLEAR NO ORGANISMS SEEN Performed at [facility] Performed at [facility]    Culture   Final    NO GROWTH 2 DAYS Performed at [facility]     Report Status 02/20/2015 FINAL  Final  Tissue culture     Status: None (Preliminary result)   Collection Time: 02/18/15 11:53 AM  Result Value Ref Range Status  Specimen Description TISSUE OTHER  Final   Special Requests MITRAL VALVE LEAFLET POSTERIOR  Final   Gram Stain   Final    NO WBC SEEN NO ORGANISMS SEEN Performed at [facility]    Culture   Final    NO GROWTH 3 DAYS Performed at [facility]    Report Status PENDING  Incomplete    Assessment: He underwent minimally invasive mitral valve valvuloplasty and annuloplasty 3 days ago. No vegetation was noted at the time of surgery and tissue Gram stain and cultures are negative. He needs at least one more week of antibiotic therapy. I will discuss optimal duration of postoperative antibiotic therapy with Dr. Cornelius Moras.  Plan: 1. Continue ampicillin for now 2. I will follow-up on Wednesday, 02/23/2015  Cliffton Asters, MD [facility] [phone] pager   [phone] cell 02/21/2015, 11:50 AM

## 2015-02-21 NOTE — Progress Notes (Addendum)
[address]       [place] [id number]             [phone]        CARDIOTHORACIC SURGERY PROGRESS NOTE   R3 Days Post-Op Procedure(s) (LRB): MINIMALLY INVASIVE MITRAL VALVE (MV) REPAIR (Right) TRANSESOPHAGEAL ECHOCARDIOGRAM (TEE) (N/A)  Subjective: Looks good.  Reports feeling "okay".  Mild soreness in chest.  No SOB.  Feels hungry.  Some hypoglycemia overnight but no associated symptoms.  Objective: Vital signs: BP Readings from Last 1 Encounters:  02/21/15 154/81   Pulse Readings from Last 1 Encounters:  02/21/15 91   Resp Readings from Last 1 Encounters:  02/21/15 14   Temp Readings from Last 1 Encounters:  02/21/15 99.7 F (37.6 C) Oral    Hemodynamics:    Physical Exam:  Rhythm:   sinus  Breath sounds: clear  Heart sounds:  RRR w/out murmur  Incisions:  Dressings dry, intact  Abdomen:  Soft, non-distended, non-tender  Extremities:  Warm, well-perfused, moderate LE edema   Intake/Output from previous day: 02/21 0701 - 02/22 0700 In: 2147 [P.O.:1210; I.V.:487; IV Piggyback:450] Out: 4535 [Urine:4325; Chest Tube:210] Intake/Output this shift:    Lab Results:  CBC: Recent Labs  02/20/15 0345 02/21/15 0400  WBC 13.0* 10.0  HGB 9.5* 9.3*  HCT 28.6* 28.7*  PLT 200 163    BMET:  Recent Labs  02/20/15 0345 02/21/15 0400  NA 138 136  K 4.4 3.9  CL 107 103  CO2 25 27  GLUCOSE 128* 106*  BUN 39* 44*  CREATININE 2.54* 2.63*  CALCIUM 8.5 8.3*     CBG (last 3)   Recent Labs  02/20/15 2342 02/21/15 0010 02/21/15 0351  GLUCAP 50* 84 47*    ABG    Component Value Date/Time   PHART 7.376 02/19/2015 0358   PCO2ART 40.8 02/19/2015 0358   PO2ART 148.0* 02/19/2015 0358   HCO3 24.0 02/19/2015 0358   TCO2 18 02/19/2015 1645   ACIDBASEDEF 1.0 02/19/2015 0358   O2SAT 99.0 02/19/2015 0358    CXR: PORTABLE CHEST - 1 VIEW  COMPARISON: Portable chest x-ray of February 20, 2015.  FINDINGS: The tiny right  apical pneumothorax is again demonstrated but appears smaller an amounts to less than 5% of the lung volume. The right chest tube are unchanged in appearance. The tip of the lower chest tube projects across the midline and overlies the left medial costophrenic angle. The mediastinal drains and the basilar chest tube on the left have been removed. The cardiac silhouette is enlarged but stable. The pulmonary vascularity is not engorged. There has been interval improvement in subsegmental atelectasis at the right lung base. A small left pleural effusion persistscontours are within normal limits. Both lungs are clear. The visualized skeletal structures are unremarkable.  IMPRESSION: 1. A tiny right apical pneumothorax is present but is smaller than on the previous study. 2. There is minimal subsegmental atelectasis at the right lung base which has improved.   Electronically Signed  By: Dwayne SwazilandJordan  On: 02/21/2015 07:55  Assessment/Plan: S/P Procedure(s) (LRB): MINIMALLY INVASIVE MITRAL VALVE (MV) REPAIR (Right) TRANSESOPHAGEAL ECHOCARDIOGRAM (TEE) (N/A)  Overall doing well POD3 Maintaining NSR Hypertension under better control Chronic kidney disease with pre-op acute exacerbation, BUN/Cr up slightly last 24 hours - slightly above pre-op baseline Expected post op volume excess, diuresing very well Expected post op acute blood loss anemia, pre-op chronic anemia, stable Type II diabetes mellitus, hypoglycemic overnight Staph aureus bacterial  endocarditis, OR cultures all remain no growth so far Septic arthritis right shoulder s/p arthroscopic washout and debridement 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

## 2015-02-21 NOTE — Progress Notes (Signed)
Hypoglycemic Event  CBG: 48  Treatment: D50 IV 25 mL  Symptoms: None  Follow-up CBG: Time:0415 CBG Result:159  Possible Reasons for Event: Inadequate meal intake  Patient asymptomatic.  Will increase diet as tolerated.      Chelsea AusStein, Gay Moncivais L  Remember to initiate Hypoglycemia Order Set & complete

## 2015-02-21 NOTE — Progress Notes (Signed)
Hypoglycemic Event  CBG: 48  Treatment: D50 IV 25 mL  Symptoms: None  Follow-up CBG: Time:0015 CBG Result:84  Possible Reasons for Event: Inadequate meal intake    Chelsea AusStein, Sriya Kroeze L  Remember to initiate Hypoglycemia Order Set & complete

## 2015-02-21 NOTE — Progress Notes (Signed)
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  1.Renal-AKII superimposed upon baseline stage 3 CKD; and CKD on the basis of his DM and HTN.  2. Hypertension/volume - he has significant edema.  3. Anemia -  s/p PRBCs   Plan: Diuretics now managed by Dr. Barry Dieneswens.  Need to avoid PICC line (tunneled IJ OK).            Please call if assistance desired.  Subjective: Interval History: 4325cc UOP last 24 hours  Objective: Vital signs in last 24 hours: Temp:  [97.8 F (36.6 C)-99.7 F (37.6 C)] 99.7 F (37.6 C) (02/22 0700) Pulse Rate:  [75-91] 86 (02/22 1000) Resp:  [9-32] 11 (02/22 1000) BP: (128-175)/(69-100) 128/69 mmHg (02/22 1000) SpO2:  [96 %-100 %] 98 % (02/22 1000) Weight:  [118.2 kg (260 lb 9.3 oz)] 118.2 kg (260 lb 9.3 oz) (02/22 0500) Weight change: 5.8 kg (12 lb 12.6 oz)  Intake/Output from previous day: 02/21 0701 - 02/22 0700 In: 2147 [P.O.:1210; I.V.:487; IV Piggyback:450] Out: 4535 [Urine:4325; Chest Tube:210] Intake/Output this shift: Total I/O In: 280 [P.O.:240; I.V.:40] Out: 525 [Urine:525]  General appearance: alert, slowed mentation and flat affect Resp: diminished breath sounds bibasilar Cardio: regular rate and rhythm, S1, S2 normal, no murmur, click, rub or gallop Extremities: edema 2-3+  Lab Results:  Recent Labs  02/20/15 0345 02/21/15 0400  WBC 13.0* 10.0  HGB 9.5* 9.3*  HCT 28.6* 28.7*  PLT 200 163   BMET:  Recent Labs  02/20/15 0345 02/21/15 0400  NA 138 136  K 4.4 3.9  CL 107 103  CO2 25 27  GLUCOSE 128* 106*  BUN 39* 44*  CREATININE 2.54* 2.63*  CALCIUM 8.5 8.3*   No results for input(s):  PTH in the last 72 hours. Iron Studies: No results for input(s): IRON, TIBC, TRANSFERRIN, FERRITIN in the last 72 hours. Studies/Results: Dg Chest Port 1 View  02/21/2015   CLINICAL DATA:  Status post minimally invasive mitral valve repair, history of acute renal failure, diabetes, and tobacco use.  EXAM: PORTABLE CHEST - 1 VIEW  COMPARISON:  Portable chest x-ray of February 20, 2015.  FINDINGS: The tiny right apical pneumothorax is again demonstrated but appears smaller an amounts to less than 5% of the lung volume. The right chest tube are unchanged in appearance. The tip of the lower chest tube projects across the midline and overlies the left medial costophrenic angle. The mediastinal drains and the basilar chest tube on the left have been removed. The cardiac silhouette is enlarged but stable. The pulmonary vascularity is not engorged. There has been interval improvement in subsegmental atelectasis at the right lung base. A small left pleural effusion persistscontours are within normal limits. Both lungs are clear. The visualized skeletal structures are unremarkable.  IMPRESSION: 1. A tiny right apical pneumothorax is present but is smaller than on the previous study. 2. There is minimal subsegmental atelectasis at the right lung base which has improved.   Electronically Signed   By: David  SwazilandJordan   On: 02/21/2015 07:55   Dg Chest Port 1 View  02/20/2015   CLINICAL DATA:  Mitral valvuloplasty  EXAM: PORTABLE CHEST - 1 VIEW  COMPARISON:  02/19/2015  FINDINGS: There is a right chest tube. There is a left base chest tube. There is a left jugular sheath. The Swan-Ganz catheter has been removed. There is a trace right apical pneumothorax. Mild basilar atelectatic changes are present on the right.  IMPRESSION: Trace right apical pneumothorax. Mild unchanged right basilar atelectatic changes.   Electronically Signed   By: Ellery Plunk M.D.   On: 02/20/2015 06:19   Scheduled: . acetaminophen  1,000 mg  Oral 4 times per day  . ampicillin (OMNIPEN) IV  2 g Intravenous 6 times per day  . antiseptic oral rinse  7 mL Mouth Rinse QID  . aspirin EC  325 mg Oral Daily  . bisacodyl  10 mg Oral Daily   Or  . bisacodyl  10 mg Rectal Daily  . cloNIDine  0.2 mg Transdermal Weekly  . docusate sodium  200 mg Oral Daily  . enoxaparin (LOVENOX) injection  30 mg Subcutaneous Q24H  . furosemide  80 mg Intravenous BID  . [START ON 02/22/2015] furosemide  40 mg Oral BID  . hydrALAZINE  25 mg Oral 3 times per day  . insulin aspart  0-24 Units Subcutaneous TID AC & HS  . metoprolol tartrate  50 mg Oral BID  . pantoprazole  40 mg Oral Daily  . sodium chloride  3 mL Intravenous Q12H  . sodium chloride  3 mL Intravenous Q12H  . warfarin  2.5 mg Oral q1800  . Warfarin - Physician Dosing Inpatient   Does not apply q1800     LOS: 19 days   Dontea Corlew C 02/21/2015,10:39 AM

## 2015-02-21 NOTE — Progress Notes (Signed)
Inpatient Diabetes Program Recommendations  AACE/ADA: New Consensus Statement on Inpatient Glycemic Control (2013)  Target Ranges:  Prepandial:   less than 140 mg/dL      Peak postprandial:   less than 180 mg/dL (1-2 hours)      Critically ill patients:  140 - 180 mg/dL   Inpatient Diabetes Program Recommendations Insulin - Basal: Please consider ordering only 15 units levemir or lantus at this time. Basal portion of 70/30 at home totals 18 units. -Should be safe with only 15 units daily or HS. Correction (SSI): Continue as ordered. Insulin - Meal Coverage: xxxxx Diet: [id number]  Thank you Lenor CoffinAnn Derrall Hicks, RN, MSN, CDE  Diabetes Inpatient Program Office: [phone] Pager: [phone]

## 2015-02-21 NOTE — Progress Notes (Signed)
CARDIAC REHAB PHASE I   PRE:  Rate/Rhythm: 87 first deg    BP: sitting 155/95    SaO2: 98 RA  MODE:  Ambulation: 350 ft   POST:  Rate/Rhythm: 95 first deg    BP: sitting 129/63     SaO2: 95 RA  Pt sts he has not walked yet. Reluctant to walk with line in his neck. Seems to generally feel poorly. Able to stand indepedently. X1 LOB leaving room. Leaning forward with RW, this improved with distance. Fairly steady rest of walk. To recliner after walk to eat. Wife present and supportive. Put RW in room to use later. Encouraged x2 more walks today and to elevate legs. [id number]  Dwayne [name], Dwayne [name] CES, ACSM 02/21/2015 2:05 PM

## 2015-02-22 ENCOUNTER — Inpatient Hospital Stay (HOSPITAL_COMMUNITY): Payer: Medicaid Other

## 2015-02-22 ENCOUNTER — Encounter (HOSPITAL_COMMUNITY): Payer: Self-pay | Admitting: Thoracic Surgery (Cardiothoracic Vascular Surgery)

## 2015-02-22 LAB — PROTIME-INR
INR: 1.17 (ref 0.00–1.49)
Prothrombin Time: 15.1 seconds (ref 11.6–15.2)

## 2015-02-22 LAB — RENAL FUNCTION PANEL
ALBUMIN: 1.7 g/dL — AB (ref 3.5–5.2)
Anion gap: 6 (ref 5–15)
BUN: 46 mg/dL — AB (ref 6–23)
CHLORIDE: 103 mmol/L (ref 96–112)
CO2: 30 mmol/L (ref 19–32)
CREATININE: 2.43 mg/dL — AB (ref 0.50–1.35)
Calcium: 8.4 mg/dL (ref 8.4–10.5)
GFR calc Af Amer: 38 mL/min — ABNORMAL LOW (ref >90)
GFR calc non Af Amer: 33 mL/min — ABNORMAL LOW (ref >90)
Glucose, Bld: 121 mg/dL — ABNORMAL HIGH (ref 70–99)
POTASSIUM: 3.8 mmol/L (ref 3.5–5.1)
Phosphorus: 4.8 mg/dL — ABNORMAL HIGH (ref 2.3–4.6)
Sodium: 139 mmol/L (ref 135–145)

## 2015-02-22 LAB — CBC
HCT: 25.8 % — ABNORMAL LOW (ref 39.0–52.0)
Hemoglobin: 8.7 g/dL — ABNORMAL LOW (ref 13.0–17.0)
MCH: 29.5 pg (ref 26.0–34.0)
MCHC: 33.7 g/dL (ref 30.0–36.0)
MCV: 87.5 fL (ref 78.0–100.0)
PLATELETS: 168 10*3/uL (ref 150–400)
RBC: 2.95 MIL/uL — AB (ref 4.22–5.81)
RDW: 14.4 % (ref 11.5–15.5)
WBC: 6.6 10*3/uL (ref 4.0–10.5)

## 2015-02-22 LAB — GLUCOSE, CAPILLARY
GLUCOSE-CAPILLARY: 168 mg/dL — AB (ref 70–99)
Glucose-Capillary: 123 mg/dL — ABNORMAL HIGH (ref 70–99)
Glucose-Capillary: 166 mg/dL — ABNORMAL HIGH (ref 70–99)
Glucose-Capillary: 187 mg/dL — ABNORMAL HIGH (ref 70–99)

## 2015-02-22 LAB — BODY FLUID CULTURE: CULTURE: NO GROWTH

## 2015-02-22 LAB — TISSUE CULTURE
CULTURE: NO GROWTH
Gram Stain: NONE SEEN

## 2015-02-22 MED ORDER — WARFARIN VIDEO: Freq: Once | Status: DC

## 2015-02-22 MED ORDER — COUMADIN BOOK
Freq: Once | Status: AC
  Administered 2015-02-22: 12:00:00
  Filled 2015-02-22: qty 1

## 2015-02-22 MED ORDER — SODIUM CHLORIDE 0.9 % IJ SOLN
10.0000 mL | INTRAMUSCULAR | Status: DC | PRN
  Administered 2015-02-22: 10 mL

## 2015-02-22 MED ORDER — ENOXAPARIN SODIUM 30 MG/0.3ML ~~LOC~~ SOLN
30.0000 mg | SUBCUTANEOUS | Status: DC
  Administered 2015-02-23 – 2015-02-24 (×2): 30 mg via SUBCUTANEOUS
  Filled 2015-02-22 (×4): qty 0.3

## 2015-02-22 NOTE — Progress Notes (Addendum)
      [address]       [name] [id number]             [phone]      4 Days Post-Op Procedure(s) (LRB): MINIMALLY INVASIVE MITRAL VALVE (MV) REPAIR (Right) TRANSESOPHAGEAL ECHOCARDIOGRAM (TEE) (N/A)   Subjective:  [name] complains of his neck line being uncomfortable.  He otherwise has no specific complaints.  Ambulated yesterday with cardiac rehab Objective: Vital signs in last 24 hours: Temp:  [98.1 F (36.7 C)-99.7 F (37.6 C)] 98.1 F (36.7 C) (02/23 0532) Pulse Rate:  [78-89] 78 (02/23 0532) Cardiac Rhythm:  [-] Heart block (02/22 2015) Resp:  [11-20] 19 (02/23 0532) BP: (128-158)/(69-95) 156/76 mmHg (02/23 0532) SpO2:  [97 %-99 %] 98 % (02/23 0532) Weight:  [243 lb 11.2 oz (110.542 kg)] 243 lb 11.2 oz (110.542 kg) (02/23 0532)  Intake/Output from previous day: 02/22 0701 - 02/23 0700 In: 440 [P.O.:240; I.V.:100; IV Piggyback:100] Out: 4730 [Urine:4710; Chest Tube:20]  General appearance: alert, cooperative and no distress Heart: regular rate and rhythm Lungs: clear to auscultation bilaterally Abdomen: soft, non-tender; bowel sounds normal; no masses,  no organomegaly Extremities: edema 2-3+ pitting Wound: clean and dry  Lab Results:  Recent Labs  02/21/15 0400 02/22/15 0450  WBC 10.0 6.6  HGB 9.3* 8.7*  HCT 28.7* 25.8*  PLT 163 168   BMET:  Recent Labs  02/21/15 0400 02/22/15 0450  NA 136 139  K 3.9 3.8  CL 103 103  CO2 27 30  GLUCOSE 106* 121*  BUN 44* 46*  CREATININE 2.63* 2.43*  CALCIUM 8.3* 8.4    PT/INR:  Recent Labs  02/22/15 0450  LABPROT 15.1  INR 1.17   ABG    Component Value Date/Time   PHART 7.376 02/19/2015 0358   HCO3 24.0 02/19/2015 0358   TCO2 18 02/19/2015 1645   ACIDBASEDEF 1.0 02/19/2015 0358   O2SAT 99.0 02/19/2015 0358   CBG (last 3)   Recent Labs  02/21/15 1654 02/21/15 2056 02/22/15 0552  GLUCAP 128* 145* 123*    Assessment/Plan: S/P Procedure(s) (LRB): MINIMALLY  INVASIVE MITRAL VALVE (MV) REPAIR (Right) TRANSESOPHAGEAL ECHOCARDIOGRAM (TEE) (N/A)  1. CV- NSR, remains hypertensive- will increase Lopressor to home dose of 75 mg BID, continue Clonidine and hydralazine 2. Pulm- off oxygen, small pleural effusion on the right 3. INR- 1.17, will repeat 2.5 mg if no rise in INR will increase dose tomorrow 4. Renal- CKD, creatinine came down to 2.43, significant LE edema, weight is below baseline continue diuretics, Nephrology following 5. ID- Endocarditis continue Ampicillin 6. DM- sugars have been on the low side, levemir discontinued yesterday, Diabetes education recommended Lantus 15 units daily, will not order at this time, sugars have been well controlled with SSIP will monitor  7. Dispo- patient stable, increase Lopressor for HTN to home dose, continue coumadin, needs to ambulate   LOS: 20 days    BARRETT, ERIN 02/22/2015  I have seen and examined the patient and agree with the assessment and plan as outlined.  Needs PICC line for home IV antibiotics.  He will need another 6 weeks of antibiotics    Dwayne [name] 02/22/2015 8:41 AM

## 2015-02-22 NOTE — Progress Notes (Signed)
Patient ID: Rob HickmanBenjamin Vanriper, male   DOB: 11/25/1980, 35 y.o.   MRN: [id number]    Pt with endocarditis and need for 6 more weeks of at home antibiotics  Hx CKD---may need arm veins for possible renal intervention. MD has asked for tunneled central catheter placement  Consent signed andin chart

## 2015-02-22 NOTE — Progress Notes (Signed)
CARDIAC REHAB PHASE I   PRE:  Rate/Rhythm: 75 SR  BP:  Supine:   Sitting: 140/87  Standing:    SaO2: 99%RA  MODE:  Ambulation: 550 ft   POST:  Rate/Rhythm: 82 SR  BP:  Supine:   Sitting: 168/80  Standing:    SaO2: 99%RA 1007-1033 Pt walked 550 ft on RA with rolling walker with fairly steady gait. Shuffles feet . Tolerated well. C/o IV in neck bothering him. To recliner with legs up after walk.    Luetta Nuttingharlene Marlana Mckowen, RN BSN  02/22/2015 10:28 AM

## 2015-02-23 ENCOUNTER — Inpatient Hospital Stay (HOSPITAL_COMMUNITY): Payer: Medicaid Other

## 2015-02-23 LAB — RENAL FUNCTION PANEL
Albumin: 1.8 g/dL — ABNORMAL LOW (ref 3.5–5.2)
Anion gap: 5 (ref 5–15)
BUN: 37 mg/dL — ABNORMAL HIGH (ref 6–23)
CO2: 29 mmol/L (ref 19–32)
Calcium: 8.4 mg/dL (ref 8.4–10.5)
Chloride: 105 mmol/L (ref 96–112)
Creatinine, Ser: 1.82 mg/dL — ABNORMAL HIGH (ref 0.50–1.35)
GFR calc Af Amer: 54 mL/min — ABNORMAL LOW (ref >90)
GFR, EST NON AFRICAN AMERICAN: 47 mL/min — AB (ref >90)
GLUCOSE: 116 mg/dL — AB (ref 70–99)
PHOSPHORUS: 3.9 mg/dL (ref 2.3–4.6)
POTASSIUM: 3.6 mmol/L (ref 3.5–5.1)
Sodium: 139 mmol/L (ref 135–145)

## 2015-02-23 LAB — ANAEROBIC CULTURE: GRAM STAIN: NONE SEEN

## 2015-02-23 LAB — GLUCOSE, CAPILLARY
GLUCOSE-CAPILLARY: 117 mg/dL — AB (ref 70–99)
GLUCOSE-CAPILLARY: 115 mg/dL — AB (ref 70–99)
GLUCOSE-CAPILLARY: 139 mg/dL — AB (ref 70–99)

## 2015-02-23 LAB — PROTIME-INR
INR: 1.06 (ref 0.00–1.49)
Prothrombin Time: 13.9 seconds (ref 11.6–15.2)

## 2015-02-23 MED ORDER — SODIUM CHLORIDE 0.9 % IJ SOLN
10.0000 mL | Freq: Two times a day (BID) | INTRAMUSCULAR | Status: DC
  Administered 2015-02-24: 10 mL

## 2015-02-23 MED ORDER — CEFAZOLIN SODIUM-DEXTROSE 2-3 GM-% IV SOLR
2.0000 g | Freq: Once | INTRAVENOUS | Status: AC
  Administered 2015-02-23: 2 g via INTRAVENOUS

## 2015-02-23 MED ORDER — CEFAZOLIN SODIUM-DEXTROSE 2-3 GM-% IV SOLR
INTRAVENOUS | Status: AC
  Filled 2015-02-23: qty 50

## 2015-02-23 MED ORDER — FUROSEMIDE 10 MG/ML IJ SOLN
80.0000 mg | Freq: Two times a day (BID) | INTRAMUSCULAR | Status: AC
  Administered 2015-02-23 (×2): 80 mg via INTRAVENOUS
  Filled 2015-02-23: qty 8

## 2015-02-23 MED ORDER — SODIUM CHLORIDE 0.9 % IJ SOLN: 10.0000 mL | INTRAMUSCULAR | Status: DC | PRN

## 2015-02-23 MED ORDER — FENTANYL CITRATE 0.05 MG/ML IJ SOLN
INTRAMUSCULAR | Status: AC
  Filled 2015-02-23: qty 4

## 2015-02-23 MED ORDER — FUROSEMIDE 40 MG PO TABS
40.0000 mg | ORAL_TABLET | Freq: Two times a day (BID) | ORAL | Status: DC
  Administered 2015-02-24 – 2015-02-25 (×4): 40 mg via ORAL
  Filled 2015-02-23 (×5): qty 1

## 2015-02-23 MED ORDER — WARFARIN SODIUM 5 MG PO TABS
5.0000 mg | ORAL_TABLET | Freq: Every day | ORAL | Status: DC
  Administered 2015-02-23 – 2015-02-25 (×3): 5 mg via ORAL
  Filled 2015-02-23 (×4): qty 1

## 2015-02-23 MED ORDER — CLONIDINE HCL 0.3 MG PO TABS
0.3000 mg | ORAL_TABLET | Freq: Two times a day (BID) | ORAL | Status: DC
  Administered 2015-02-23 – 2015-02-24 (×4): 0.3 mg via ORAL
  Filled 2015-02-23 (×6): qty 1

## 2015-02-23 MED ORDER — MIDAZOLAM HCL 2 MG/2ML IJ SOLN
INTRAMUSCULAR | Status: AC
  Filled 2015-02-23: qty 4

## 2015-02-23 MED ORDER — FENTANYL CITRATE 0.05 MG/ML IJ SOLN
INTRAMUSCULAR | Status: AC | PRN
  Administered 2015-02-23: 50 ug via INTRAVENOUS

## 2015-02-23 MED ORDER — LIDOCAINE HCL 1 % IJ SOLN
INTRAMUSCULAR | Status: AC
  Filled 2015-02-23: qty 20

## 2015-02-23 MED ORDER — MIDAZOLAM HCL 2 MG/2ML IJ SOLN
INTRAMUSCULAR | Status: AC | PRN
  Administered 2015-02-23: 1 mg via INTRAVENOUS

## 2015-02-23 MED ORDER — METOPROLOL TARTRATE 50 MG PO TABS
75.0000 mg | ORAL_TABLET | Freq: Two times a day (BID) | ORAL | Status: DC
  Administered 2015-02-23 – 2015-02-25 (×5): 75 mg via ORAL
  Filled 2015-02-23 (×6): qty 1

## 2015-02-23 NOTE — Progress Notes (Signed)
CARDIAC REHAB PHASE I   PRE:  Rate/Rhythm: 68 SR    BP: sitting 180/95    SaO2: 98 RA  MODE:  Ambulation: 1100 ft   POST:  Rate/Rhythm: 86 SR    BP: sitting 181/101     SaO2: 97 RA  Pt very grumpy about waiting for line placement. BP elevated.  Tolerated walk well otherwise, no c/o except right rib pain. Doubled distance. Pt does shuffle feet, which he did pre-op. Will f/u. [id number]  Elissa LovettReeve, Sione Baumgarten MoweaquaKristan CES, ACSM 02/23/2015 11:30 AM

## 2015-02-23 NOTE — Progress Notes (Signed)
Patient ID: Dwayne [name], male   DOB: [birth year], 35 y.o.   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 acetaminophen  1,000 mg Oral 4 times per day  . ampicillin (OMNIPEN) IV  2 g Intravenous 6 times per day  . antiseptic oral rinse  7 mL Mouth Rinse QID  . aspirin EC  325 mg Oral Daily  . bisacodyl  10 mg Oral Daily   Or  . bisacodyl  10 mg Rectal Daily  . cloNIDine  0.3 mg Oral BID  . docusate sodium  200 mg Oral Daily  . enoxaparin (LOVENOX) injection  30 mg Subcutaneous Q24H  . furosemide  80 mg Intravenous BID  . [START ON 02/24/2015] furosemide  40 mg Oral BID  . hydrALAZINE  25 mg Oral 3 times per day  . insulin aspart  0-24 Units Subcutaneous TID AC & HS  . metoprolol tartrate  75 mg Oral BID  . pantoprazole  40 mg Oral Daily  . sodium chloride  3 mL Intravenous Q12H  . sodium chloride  3 mL Intravenous Q12H  . warfarin  5 mg Oral q1800  . warfarin   Does not apply Once  . Warfarin - Physician Dosing Inpatient   Does not apply q1800    Subjective: He is frustrated that his PICC has not been placed yet. His right shoulder is feeling better.  Review of Systems: Pertinent items are noted in HPI.  Past Medical History  Diagnosis Date  . Hypertension   . Acute renal failure   . Bacteremia     MSSA   . Diabetes type 2, uncontrolled 01/09/2015  .  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 Essential hypertension, benign 01/18/2014  . Infective myositis of left thigh   . Noncompliance 01/18/2014  . Septic shock   . Staphylococcus aureus bacteremia with sepsis   . Tobacco abuse 01/15/2014  . Mitral regurgitation 01/13/2015    Severe by TEE  . MSSA (methicillin susceptible Staphylococcus aureus)   . Type II diabetes mellitus     not controlled  . Diabetic foot infection 01/15/2014  . S/P minimally-invasive mitral valve repair 02/18/2015    Complex valvuloplasty including bovine pericardial patch repair of perforation of posterior leaflet with 28 mm Sorin Memo 3D ring annuloplasty via right mini thoracotomy approach     History  Substance Use Topics  . Smoking status: Former Smoker -- 12 years    Types: Cigars    Quit date: 01/09/2015  . Smokeless tobacco: Never Used  . Alcohol Use: Yes     Comment: 02/02/2015 "I'll have 2-3 mixed drinks 2-3 times/yr"    History reviewed. No pertinent family history. No Known Allergies  OBJECTIVE: Blood pressure 174/85,  pulse 71, temperature 99.1 F (37.3 C), temperature source Oral, resp. rate 16, height 6\' 2"  (1.88 m), weight 243 lb 11.2 oz (110.542 kg), SpO2 98 %. General: He is up walking in his room Lungs: Clear Cor: Regular S1 and S2 with no murmur Extremities: No unusual swelling, redness or warmth of his right shoulder. No change in his lower extremity edema.   Lab Results Lab Results  Component Value Date   WBC 6.6 02/22/2015   HGB 8.7* 02/22/2015   HCT 25.8* 02/22/2015   MCV 87.5 02/22/2015   PLT 168 02/22/2015    Lab Results  Component Value Date   CREATININE 1.82* 02/23/2015   BUN 37* 02/23/2015   NA 139 02/23/2015   K 3.6 02/23/2015   CL 105 02/23/2015   CO2 29 02/23/2015    Lab Results  Component Value Date   ALT 20 02/02/2015   AST 18 02/02/2015   ALKPHOS 112 02/02/2015   BILITOT 0.3 02/02/2015     Microbiology: Recent Results (from the past 240 hour(s))  Gram  stain     Status: None   Collection Time: 02/18/15 10:21 AM  Result Value Ref Range Status   Specimen Description FLUID PERITONEAL  Final   Special Requests FLUID ON SWAB PT ON ZEDOCEF AND VANCOMYCIN  Final   Gram Stain   Final    RARE WBC PRESENT, PREDOMINANTLY MONONUCLEAR NO ORGANISMS SEEN    Report Status 02/18/2015 FINAL  Final  Body fluid culture     Status: None   Collection Time: 02/18/15 10:24 AM  Result Value Ref Range Status   Specimen Description FLUID PERITONEAL  Final   Special Requests FLUID ON SWAB PT ON ZEDOCEF AND VANCOMYCIN  Final   Gram Stain   Final    RARE WBC PRESENT, PREDOMINANTLY MONONUCLEAR NO ORGANISMS SEEN [facility] Performed at [facility]    Culture   Final    NO GROWTH 3 DAYS [facility]formed at [facility]    Report Status 02/22/2015 FINAL  Final  Anaerobic culture     Status: None   Collection Time: 02/18/15 11:38 AM  Result Value Ref Range Status   Specimen Description WOUND OTHER  Final   Special Requests MITRAL VALVE/PT ON ZEDOCEF VANCOMYCIN  Final   Gram Stain   Final    NO WBC SEEN NO SQUAMOUS EPITHELIAL CELLS SEEN NO ORGANISMS SEEN [facility]formed at [facility]    Culture   Final    NO ANAEROBES ISOLATED Performed at [facility]    Report Status 02/23/2015 FINAL  Final  Gram stain     Status: None   Collection Time: 02/18/15 11:38 AM  Result Value Ref Range Status   Specimen Description WOUND OTHER  Final   Special Requests MITRAL VALVE/PT ON ZEDOCEF VANCOMYCIN  Final   Gram Stain   Final    RARE WBC PRESENT, PREDOMINANTLY MONONUCLEAR NO ORGANISMS SEEN    Report Status 02/18/2015 FINAL  Final  Wound culture     Status: None   Collection Time: 02/18/15 11:38 AM  Result Value Ref Range Status   Specimen Description WOUND OTHER  Final   Special Requests MITRAL VALVE  Final   Gram Stain   Final    WBC PRESENT, PREDOMINANTLY MONONUCLEAR NO ORGANISMS SEEN [facility]formed at [facility] Performed at [facility]    Culture   Final    NO GROWTH 2 DAYS Performed at [facility]  Report Status 02/20/2015 FINAL  Final  Tissue culture     Status: None   Collection Time: 02/18/15 11:53 AM  Result Value Ref Range Status   Specimen Description TISSUE OTHER  Final   Special Requests MITRAL VALVE LEAFLET POSTERIOR  Final   Gram Stain   Final    NO WBC SEEN NO ORGANISMS SEEN Performed at [facility]    Culture   Final    NO GROWTH 3 DAYS Performed at [facility]    Report Status 02/22/2015 FINAL  Final    Assessment: He is improving slowly. I have discussed antibiotic management with Dr. Charlann Boxer and we will plan on 6 weeks of postoperative IV antibiotic therapy through 03/31/2015  Plan: 1. Continue ampicillin  2. I will sign off now and arrange follow-up in our clinic within the next few weeks  Cliffton Asters, MD [facility] [phone] pager   [phone] cell 02/23/2015, 10:46 AM

## 2015-02-23 NOTE — Discharge Summary (Signed)
Physician Discharge Summary  Patient ID: Dwayne [name] MRN: [id number] DOB/AGE: 1980/10/24 35 y.o.  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 S/P minimally-invasive mitral valve repair 02/18/2015  . Endocarditis   . Septic arthritis of shoulder, right   . Acute on chronic diastolic heart failure   . Facial numbness   . Vertebral osteomyelitis   . Chronic renal insufficiency, stage III (moderate) 02/07/2015  . Acute renal failure superimposed on stage 3 chronic kidney disease   . Bacterial endocarditis   . Edema   . HTN (hypertension)   . Pedal edema   . Right shoulder pain   . Arthritis, septic, shoulder   . Acute pericarditis   . Hypoalbuminemia 02/02/2015  . Anasarca 02/02/2015  . Acute diastolic CHF (congestive heart failure), NYHA class 3 02/02/2015  . DM (diabetes mellitus) type 2, uncontrolled, with ketoacidosis   . Hypertensive heart disease 01/24/2015  . Periodontal disease 01/24/2015  . PVD (peripheral vascular disease) 01/24/2015  . Septic embolism 01/24/2015  . Leukocytosis   . Congestive heart failure   . Discitis of thoracic region   . Back pain   . Poor dentition   . Severe mitral regurgitation 01/13/2015  . 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 Purulent pericarditis   . Acute on chronic renal  insufficiency   . Bacteremia   . Septic joint of right shoulder region   . Infective myositis of left thigh   . Diabetic ulcer of left foot associated with type 2 diabetes mellitus   . Diabetic ulcer of right foot associated with type 2 diabetes mellitus   . Osteomyelitis   . Shoulder pain   . Thigh pain, musculoskeletal   . Septic shock   . Staphylococcus aureus bacteremia with sepsis   . Smoker   . Elevated troponin   . Dehydration 01/09/2015  . Diarrhea 01/09/2015  . Diabetes type 2, uncontrolled 01/09/2015  . Hyperglycemia 01/09/2015  . Uncontrolled hypertension 01/09/2015  . Diabetic foot ulcers 01/09/2015  . Pain   . Essential hypertension, benign 01/18/2014  . Noncompliance 01/18/2014  . Diabetic foot infection 01/15/2014  . Tobacco abuse 01/15/2014  . Diabetes mellitus due to underlying condition with foot ulcer 01/15/2014   History of the present illness:  The patient is a 35 year old black male from [place] with a history of long-standing hypertension, type 2 diabetes mellitus, with complication, chronic diabetic ulcers on the plantar surface of both feet, and medical noncompliance who was admitted to the hospital on 01/09/2015 with severe generalized weakness and fatigue for several weeks any 24 hour history of fevers, chills, and diarrhea. He was felt to require admission for further evaluation and treatment along with medical stabilization. In addition to the above symptoms he also had diffuse myalgias as well as pain localized to the right shoulder and left thigh. During the evaluation echocardiogram was performed and he was found to have  mitral valve endocarditis. He also had multiple other medical issues including acute diastolic heart failure, acute renal insufficiency/CKD, pericardial effusions, PVD, normocytic anemia, right shoulder septic arthritis/osteomyelitis/myositis, diarrhea and hypertension. He underwent dental surgery by Dr. Kristin Bruins during the  hospitalization. Nephrology assisted with management of kidney issues. Vascular surgery consultation was obtained assisted with management of PVD although he required no aggressive workup. Orthopedics assist with management of shoulder infection. It is noted that the endocarditis also was associated with septic emboli to the brain and possible emboli to the kidney as well. He was felt to require a 6 week period of intravenous antibiotics prior to proceeding with mitral valve surgery. He was discharged on 01/27/2015 and he presented to the emergency room on 02/02/2015 with shortness of breath, orthopnea and worsening lower extremity and abdominal edema. He was admitted for further evaluation and management.  Discharged Condition: good  Hospital Course: The patient was readmitted as stated on 02/02/2015. He was again seen and multiple consultations including cardiology, cardiothoracic surgery, infectious disease, orthopedics, and interna; medicine. He was felt to require orthopedic intervention due to septic right shoulder and on 02/05/2015 he underwent arthroscopic shoulder surgery by Dr. Lajoyce Corners which included extensive debridement of the glenohumeral joint and subacromial decompression. He continued on a course of medical stabilization as well as physical rehabilitation. Maximal medical management of his renal insufficiency, hypertension and anemia were obtained. He was felt to be stable to proceed and on 02/18/2015 he underwent the following procedure:  CARDIOTHORACIC SURGERY OPERATIVE NOTE  Date of Procedure:02/18/2015  Preoperative Diagnosis:  Staphylococcus aureus bacterial endocarditis  Severe Mitral Regurgitation  Postoperative Diagnosis:Same  Procedure:   Minimally-Invasive Mitral Valve Repair Complex valvuloplasty including bovine pericardial patch repair of posterior leaflet perforation Sorin Memo 3D Ring Annuloplasty (size 28mm,  catalog # [id number], serial # [id number])   Surgeon:Clarence H. Cornelius Moras, MD  Assistants:Erin Barrett, PA-C and Avis Epley, PA-S  Anesthesia:David Noreene Larsson, MD  Operative Findings:  Perforation of posterior leaftlet (P3)  No active vegetations  Type I mitral valve dysfunction with severe mitral regurgitation  Normal LV systolic function  Trivial residual mitral regurgitation after successful valve repair Disposition:  The patient tolerated the procedure well. The patient was reintubated using a single lumen endotracheal tube and subsequently transported to the surgical intensive care unit in stable condition. There were no intraoperative complications. All sponge instrument and needle counts are verified correct at completion of the operation.   Postoperative hospital course: Overall the patient has progressed well. He has remained neurologically intact. He has remained hemodynamically stable although it has significant postoperative volume overload. Nephrology has assisted in the management of his renal issues. Creatinine is down to 1.8. He is maintaining normal sinus rhythm. He has been started on Coumadin anticoagulation therapy. Blood pressure has been somewhat difficult to control and medications are being adjusted. He will require 6 weeks of ampicillin therapy as an outpatient. A PICC line has been placed. His diabetes will need close aggressive ongoing management. His incisions are healing well without evidence of infection. He is tolerating gradually increasing activities using standard postop cardiac rehabilitation modalities. His insulin was just restarted today, as he had taken prior to surgery. I have spoken with the diabetes coordinator. Recommendations were made. He will continue on Novolog 70/30 bid for now. He has an appointment to see the medical doctor at [facility] on 3/8. At that time, he will be further evaluated to see if he should continue on  his current Insulin or be changed.  Home health has been arranged for the administration of IV antibiotics. Also, his potassium has been around 3.5. His kidney function has improved and his creatinine is 1.44. He will be given potassium 20 meq daily with his Lasix 40 mg bid. He is felt surgically stable for discharge today.    Consults: Nephrology, cardiology, infectious disease, orthopedic surgery, internal medicine.   Significant Diagnostic Studies: Echocardiography   Discharge Exam: Blood pressure 164/84, pulse 68, temperature 97.8 F (36.6 C), temperature source Oral, resp. rate 18, height 6\' 2"  (1.88 m), weight 239 lb 10.2 oz (108.7 kg), SpO2 99 %.  Physical Exam: Cardiovascular: RRR, no murmurs, gallops, or rubs. Pulmonary: Mostly clear to auscultation bilaterally Abdomen: Soft, non tender, bowel sounds present. Extremities: Bilateral lower extremity edema. Wounds: Clean and dry. No erythema or signs of infection.  Disposition: 06-Home-Health Care Svc   Medications at time of discharge:   Medication List    STOP taking these medications        ampicillin in sodium chloride 0.9 % 50 mL  Replaced by:  ampicillin 2 g in sodium chloride 0.9 % 50 mL     feeding supplement (GLUCERNA SHAKE) Liqd     feeding supplement (PRO-STAT SUGAR FREE 64) Liqd      TAKE these medications        ampicillin 2 g in sodium chloride 0.9 % 50 mL  Inject 2 g into the vein every 4 (four) hours. Last dose is to be given on 03/31/2015.     aspirin 325 MG EC tablet  Take 1 tablet (325 mg total) by mouth daily.     cloNIDine 0.3 MG tablet  Commonly known as:  CATAPRES  Take 1 tablet (0.3 mg total) by mouth 3 (three) times daily.     freestyle lancets  Any generic available. Use as instructed     furosemide 40 MG tablet  Commonly known as:  LASIX  Take 1 tablet (40 mg total) by mouth 2 (two) times daily.     hydrALAZINE 25 MG tablet  Commonly known as:  APRESOLINE  Take 1 tablet (25 mg  total) by mouth every 8 (eight) hours.     insulin aspart protamine- aspart (70-30) 100 UNIT/ML injection  Commonly known as:  NOVOLOG MIX 70/30  Inject 0.14 mLs (14 Units total) into the skin 2 (two) times daily with a meal.     Insulin Syringe-Needle U-100 28G X 1/2" 0.5 ML Misc  Use as directed. Any generic available.     metoprolol tartrate 25 MG tablet  Commonly known as:  LOPRESSOR  Take 3 tablets (75 mg total) by mouth 2 (two) times daily.     nicotine 7 mg/24hr patch  Commonly known as:  NICODERM CQ - dosed in mg/24 hr  Place 1 patch (7 mg total) onto the skin daily.     oxyCODONE 5 MG immediate release tablet  Commonly known as:  Oxy IR/ROXICODONE  Take 1 tablet (5 mg total) by mouth every 4 (four) hours as needed for moderate pain or severe pain.     polyethylene glycol packet  Commonly known as:  MIRALAX / GLYCOLAX  Take 17 g by mouth daily.     Potassium Chloride ER 20 MEQ Tbcr  Take 20 mEq by mouth daily.     senna-docusate 8.6-50 MG per tablet  Commonly known as:  Senokot-S  Take 2 tablets by mouth 2 (two) times daily.     warfarin 5 MG tablet  Commonly known  as:  COUMADIN  Take 1 tablet (5 mg total) by mouth daily at 6 PM.        Discharge Instructions    Amb Referral to Cardiac Rehabilitation    Complete by:  As directed           Follow-up Information    Follow up with Purcell Nails, MD.   Specialty:  Cardiothoracic Surgery   Why:  03/15/2015 at 3:30 PM to see the surgeon. Please obtain a chest x-ray at [facility] at 2:30 PM. [facility] is located in the same office complex.   Contact information:   [address] [employer] [address] [place] [address] [phone]       Follow up with [facility] On 02/28/2015.   Specialty:  Cardiology   Why:  2:30 pm To obtain a PT and INR (as is on Coumadin). Please see discharge paperwork for time and date.   Contact information:   [address] [place] [address] [phone]      Follow up with [facility].   Why:  March 2 at 10 AM to see the nurse in Dr. Orvan July office for suture removal.   Contact information:   [address] [place] [address]       Follow up with Tereso Newcomer, PA-C On 03/17/2015.   Specialty:  Physician Assistant   Why:  11:30 am (Dr. Anne Fu PA)   Contact information:   [address] [place] [address] [phone]       Follow up with Lauris Poag, MD In 1 week.   Specialty:  Nephrology   Why:  Call for a follow up appointment regarding further surveillance of kidney function   Contact information:   [address] [place] [address] [phone]       Follow up with [facility]              On 03/15/2015.   Why:  Appointment is at 2:00   Contact information:   [address] [place] [address]       Follow up with [facility]     On 03/08/2015.   Why:  F/u appointment for DM-  9:00 am   Contact information:   [address] [facility] [address] [phone]      Signed: Ardelle Balls PA-C 02/25/2015, 1:23 PM

## 2015-02-23 NOTE — Discharge Instructions (Addendum)
Mitral Valve Repair,Infective Endocarditis Infective endocarditis is an infection of the inner layer of heart (endocardium) or the heart valves. An endocarditis infection is caused by many things, including viruses, bacteria, or fungi that enter your bloodstream. If left untreated, endocarditis can cause other problems, which may include: Heart valve damage. Blood clots (embolism). Irregular heartbeat (arrhythmia). Heart failure. CAUSES  Surgery, dental work, and intravenous (IV) drug abuse can allow bacteria or fungi to enter the bloodstream. Once in the bloodstream, bacteria can attach to the inner lining of the heart or heart valves, causing infection. SYMPTOMS  Fever and chills. Skin rashes. Heart murmurs. Spleen enlargement. Unexplained weight loss. Shortness of breath. Blood in urine. Lethargy and fatigue. Sudden weakness in face, arms, legs (possible stroke). Night sweats. DIAGNOSIS  Endocarditis may be suspected if your caregiver hears a heart murmur, observes certain skin rashes, or there are changes in your eye exam. Tests include: Blood work. Transthoracic echocardiogram (TTE). A TTE uses sound waves to produce images of the heart. A hand-held device is placed on the chest and transmits sound waves. These sound waves bounce off the heart to produce images that help your caregiver detect heart damage. Transesophageal echocardiogram (TEE). For a TEE, a flexible tube is passed down your throat and into the esophagus. Because the esophagus is close to the heart, a TEE allows different images of the heart to be obtained. This type of procedure requires sedation. WHO IS AT RISK FOR INFECTIVE ENDOCARDITIS? Those with a history of endocarditis. People with artificial (prosthetic) heart valves. Cardiac transplant patients with valve disease. People with congenital heart disease, including: Those with unrepaired congenital heart defects. Those with a congenital heart defect repaired  with prosthetic materials. Within the first 6 months of the procedure, you are still at risk for infection. After 6 months, talk to your caregiver about your special needs. Those who have had surgery to correct the defect but with some remaining problems at the site of repair. PREVENTION  If you are at the highest risk for infective endocarditis, you may need to take antibiotics before having dental work or other surgical procedures. These medicines help to prevent infective endocarditis. Let your dentist and your caregiver know if you have a history of any of the following so that the necessary precautions can be taken: A ventricular septal defect (VSD). A repaired VSD. Endocarditis in the past. A prosthetic heart valve. TREATMENT  IV antibiotic treatment may be needed for several weeks to treat endocarditis. Surgery may be needed to remove the infected tissue in the heart. Surgery to repair valve damage. MAKE SURE YOU:  Understand these instructions. Will watch your condition. Will get help right away if you are not doing well or get worse. Document Released: 12/17/2005 Document Revised: 04/13/2013 Document Reviewed: 04/30/2007 [facility] Patient Information 2015 [place], [place]. This information is not intended to replace advice given to you by your health care provider. Make sure you discuss any questions you have with your health care provider. Warfarin: What You Need to Know Warfarin is an anticoagulant. Anticoagulants help prevent the formation of blood clots. They also help stop the growth of blood clots. Warfarin is sometimes referred to as a "blood thinner."  Normally, when body tissues are cut or damaged, the blood clots in order to prevent blood loss. Sometimes clots form inside your blood vessels and obstruct the flow of blood through your circulatory system (thrombosis). These clots may travel through your bloodstream and become lodged in smaller blood vessels in your  brain, which can  cause a stroke, or in your lungs (pulmonary embolism). WHO SHOULD USE WARFARIN? Warfarin is prescribed for people at risk of developing harmful blood clots: People with surgically implanted mechanical heart valves, irregular heart rhythms called atrial fibrillation, and certain clotting disorders. People who have developed harmful blood clotting in the past, including those who have had a stroke or a pulmonary embolism, or thrombosis in their legs (deep vein thrombosis [DVT]). People with an existing blood clot, such as a pulmonary embolism. WARFARIN DOSING Warfarin tablets come in different strengths. Each tablet strength is a different color, with the amount of warfarin (in milligrams) clearly printed on the tablet. If the color of your tablet is different than usual when you receive a new prescription, report it immediately to your pharmacist or health care provider. WARFARIN MONITORING The goal of warfarin therapy is to lessen the clotting tendency of blood but not prevent clotting completely. Your health care provider will monitor the anticoagulation effect of warfarin closely and adjust your dose as needed. For your safety, blood tests called prothrombin time (PT) or international normalized ratio (INR) are used to measure the effects of warfarin. Both of these tests can be done with a finger stick or a blood draw. The longer it takes the blood to clot, the higher the PT or INR. Your health care provider will inform you of your "target" PT or INR range. If, at any time, your PT or INR is above the target range, there is a risk of bleeding. If your PT or INR is below the target range, there is a risk of clotting. Whether you are started on warfarin while you are in the hospital or in your health care provider's office, you will need to have your PT or INR checked within one week of starting the medicine. Initially, some people are asked to have their PT or INR checked as much as twice a week. Once  you are on a stable maintenance dose, the PT or INR is checked less often, usually once every 2 to 4 weeks. The warfarin dose may be adjusted if the PT or INR is not within the target range. It is important to keep all laboratory and health care provider follow-up appointments. Not keeping appointments could result in a chronic or permanent injury, pain, or disability because warfarin is a medicine that requires close monitoring. WHAT ARE THE SIDE EFFECTS OF WARFARIN? Too much warfarin can cause bleeding (hemorrhage) from any part of the body. This may include bleeding from the gums, blood in the urine, bloody or dark stools, a nosebleed that is not easily stopped, coughing up blood, or vomiting blood. Too little warfarin can increase the risk of blood clots. Too little or too much warfarin can also increase the risk of a stroke. Warfarin use may cause a skin rash or irritation, an unusual fever, continual nausea or stomach upset, or severe pain in your joints or back. SPECIAL PRECAUTIONS WHILE TAKING WARFARIN Warfarin should be taken exactly as directed. It is very important to take warfarin as directed since bleeding or blood clots could result in chronic or permanent injury, pain, or disability. Take your medicine at the same time every day. If you forget to take your dose, you can take it if it is within 6 hours of when it was due. Do not change the dose of warfarin on your own to make up for missed or extra doses. If you miss more than 2 doses in a  row, you should contact your health care provider for advice. Avoid situations that cause bleeding. You may have a tendency to bleed more easily than usual while taking warfarin. The following actions can limit bleeding: Using a softer toothbrush. Flossing with waxed floss rather than unwaxed floss. Shaving with an Neurosurgeon rather than a blade. Limiting the use of sharp objects. Avoiding potentially harmful activities, such as contact  sports. Warfarin and Pregnancy or Breastfeeding Warfarin is not advised during the first trimester of pregnancy due to an increased risk of birth defects. In certain situations, a woman may take warfarin after her first trimester of pregnancy. A woman who becomes pregnant or plans to become pregnant while taking warfarin should notify her health care provider immediately. Although warfarin does not pass into breast milk, a woman who wishes to breastfeed while taking warfarin should also consult with her health care provider. Alcohol, Smoking, and Illicit Drug Use Alcohol affects how warfarin works in the body. It is best to avoid alcoholic drinks or consume very small amounts while taking warfarin. In general, alcohol intake should be limited to 1 oz (30 mL) of liquor, 6 oz (180 mL) of wine, or 12 oz (360 mL) of beer each day. Notify your health care provider if you change your alcohol intake. Smoking affects how warfarin works. It is best to avoid smoking while taking warfarin. Notify your health care provider if you change your smoking habits. It is best to avoid all illicit drugs while taking warfarin since there are few studies that show how warfarin interacts with these drugs. Other Medicines and Dietary Supplements Many prescription and over-the-counter medicines can interfere with warfarin. Be sure all of your health care providers know you are taking warfarin. Notify your health care provider who prescribed warfarin for you or your pharmacist before starting or stopping any new medicines, including over-the-counter vitamins, dietary supplements, and pain medicines. Your warfarin dose may need to be adjusted. Some common over-the-counter medicines that may increase the risk of bleeding while taking warfarin include:  Acetaminophen. Aspirin. Nonsteroidal anti-inflammatory medicines (NSAIDs), such as ibuprofen or naproxen. Vitamin E. Dietary Considerations  Foods that have moderate or high  amounts of vitamin K can interfere with warfarin. Avoid major changes in your diet or notify your health care provider before changing your diet. Eat a consistent amount of foods that have moderate or high amounts of vitamin K. Eating less foods containing vitamin K can increase the risk of bleeding. Eating more foods containing vitamin K can increase the risk of blood clots. Additional questions about dietary considerations can be discussed with a dietitian. Foods that are very high in vitamin K: Greens, such as Swiss chard and beet, collard, mustard, or turnip greens (fresh or frozen, cooked). Kale (fresh or frozen, cooked). Parsley (raw). Spinach (cooked). Foods that are high in vitamin K: Asparagus (frozen, cooked). Beans, green (frozen, cooked). Broccoli. Bok choy (cooked). Brussels sprouts (fresh or frozen, cooked). Cabbage (cooked).  Coleslaw. Foods that are moderately high in vitamin K: Blueberries. Black-eyed peas. Endive (raw). Green leaf lettuce (raw). Green scallions (raw). Kale (raw). Okra (frozen, cooked). Plantains (fried). Romaine lettuce (raw). Sauerkraut (canned). Spinach (raw). CALL YOUR CLINIC OR HEALTH CARE PROVIDER IF YOU: Plan to have any surgery or procedure. Feel sick, especially if you have diarrhea or vomiting. Experience or anticipate any major changes in your diet. Start or stop a prescription or over-the-counter medicine. Become, plan to become, or think you may be pregnant. Are having heavier than usual  menstrual periods. Have had a fall, accident, or any symptoms of bleeding or unusual bruising. Develop an unusual fever. CALL 911 IN THE [place] OR GO TO THE EMERGENCY DEPARTMENT IF YOU:  Think you may be having an allergic reaction to warfarin. The signs of an allergic reaction could include itching, rash, hives, swelling, chest tightness, or trouble breathing. See signs of blood in your urine. The signs could include reddish, pinkish, or tea-colored  urine. See signs of blood in your stools. The signs could include bright red or black stools. Vomit or cough up blood. In these instances, the blood could have either a bright red or a "coffee-grounds" appearance. Have bleeding that will not stop after applying pressure for 30 minutes such as cuts, nosebleeds, or other injuries. Have severe pain in your joints or back. Have a new and severe headache. Have sudden weakness or numbness of your face, arm, or leg, especially on one side of your body. Have sudden confusion or trouble understanding. Have sudden trouble seeing in one or both eyes. Have sudden trouble walking, dizziness, loss of balance, or coordination. Have trouble speaking or understanding (aphasia). Document Released: 12/17/2005 Document Revised: 05/03/2014 Document Reviewed: 06/12/2013 [facility] Patient Information 2015 [place], [place]. This information is not intended to replace advice given to you by your health care provider. Make sure you discuss any questions you have with your health care provider.  Care After Refer to this sheet in the next few weeks. These instructions provide you with information on caring for yourself after your procedure. Your health care provider may also give you specific instructions. Your treatment has been planned according to current medical practices, but problems sometimes occur. Call your health care provider if you have any problems or questions after your procedure.  HOME CARE INSTRUCTIONS   Take medicines only as directed by your health care provider.  Take your temperature every morning for the first 7 days after surgery. Write these down.  Weigh yourself every morning for at least 7 days after surgery. Write your weight down.  Wear elastic stockings during the day for at least 2 weeks after surgery or as directed by your health care provider. Use them longer if your ankles are swollen. The stockings help blood flow and help reduce swelling  in the legs.  Take frequent naps or rest often throughout the day.  Avoid lifting more than 10 lb (4.5 kg) or pushing or pulling things with your arms for 6-8 weeks or as directed by your health care provider.  Avoid driving or airplane travel for 4-6 weeks after surgery or as directed. If you are riding in a car for an extended period, stop every 1-2 hours to stretch your legs.  Avoid crossing your legs.  Avoid climbing stairs and using the handrail to pull yourself up for the first 2-3 weeks after surgery.  Do not take baths for 2-4 weeks after surgery. Take showers once your health care provider approves. Pat incisions dry. Do not rub incisions with a washcloth or towel.  Return to work as directed by your health care provider.  Drink enough fluids to keep your urine clear or pale yellow.  Do not strain to have a bowel movement. Eat high-fiber foods if you become constipated. You may also take a medicine to help you have a bowel movement (laxative) as directed by your health care provider.  Resume sexual activity as directed by your health care provider. SEEK MEDICAL CARE IF:   You develop a skin  rash.   Your weight is increasing each day over 2-3 days.  Your weight increases by 2 or more pounds (1 kg) in a single day.  You have a fever. SEEK IMMEDIATE MEDICAL CARE IF:   You develop chest pain that is not coming from your incision.  You develop shortness of breath or difficulty breathing.  You have drainage, redness, swelling, or pain at your incision site.  You have pus coming from your incision.  You develop light-headedness. MAKE SURE YOU:  Understand these directions.  Will watch your condition.  Will get help right away if you are not doing well or get worse. Document Released: 07/06/2005 Document Revised: 05/03/2014 Document Reviewed: 05/19/2013 [facility] Patient Information 2015 [place], [place]. This information is not intended to replace advice given to you  by your health care provider. Make sure you discuss any questions you have with your health care provider.  Information on my medicine - Coumadin   (Warfarin)  This medication education was reviewed with me or my healthcare representative as part of my discharge preparation.  The pharmacist that spoke with me during my hospital stay was:  Fredrik Rigger, [facility]  Why was Coumadin prescribed for you? Coumadin was prescribed for you because you have a blood clot or a medical condition that can cause an increased risk of forming blood clots. Blood clots can cause serious health problems by blocking the flow of blood to the heart, lung, or brain. Coumadin can prevent harmful blood clots from forming. 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 While on Coumadin (warfarin) you will need to have an INR test regularly to ensure that your dose is keeping you in the desired range. The INR (international normalized ratio) number is calculated from the result of the laboratory test called prothrombin time (PT).  If an INR APPOINTMENT HAS NOT ALREADY BEEN MADE FOR YOU please schedule an appointment to have this lab work done by your health care provider within 7 days. Your INR goal is usually a number between:  2 to 3 or your provider may give you a more narrow range like 2-2.5.  Ask your health care provider during an office visit what your goal INR is.  What  do you need to  know  About  COUMADIN? Take Coumadin (warfarin) exactly as prescribed by your healthcare provider about the same time each day.  DO NOT stop taking without talking to the doctor who prescribed the medication.  Stopping without other blood clot prevention medication to take the place of Coumadin may increase your risk of developing a new clot or stroke.  Get refills before you run out.  What do you do if you miss a dose? If you miss  a dose, take it as soon as you remember on the same day then continue your regularly scheduled regimen the next day.  Do not take two doses of Coumadin at the same time.  Important Safety Information A possible side effect of Coumadin (Warfarin) is an increased risk of bleeding. You should call your healthcare provider right away if you experience any of the following: ? Bleeding from an injury or your nose that does not stop. ? Unusual colored urine (red or dark brown) or unusual colored stools (red or black). ? Unusual bruising for unknown reasons. ? A serious fall or if you hit your head (even if there  is no bleeding).  Some foods or medicines interact with Coumadin (warfarin) and might alter your response to warfarin. To help avoid this: ? Eat a balanced diet, maintaining a consistent amount of Vitamin K. ? Notify your provider about major diet changes you plan to make. ? Avoid alcohol or limit your intake to 1 drink for women and 2 drinks for men per day. (1 drink is 5 oz. wine, 12 oz. beer, or 1.5 oz. liquor.)  Make sure that ANY health care provider who prescribes medication for you knows that you are taking Coumadin (warfarin).  Also make sure the healthcare provider who is monitoring your Coumadin knows when you have started a new medication including herbals and non-prescription products.  Coumadin (Warfarin)  Major Drug Interactions  Increased Warfarin Effect Decreased Warfarin Effect  Alcohol (large quantities) Antibiotics (esp. Septra/Bactrim, Flagyl, Cipro) Amiodarone (Cordarone) Aspirin (ASA) Cimetidine (Tagamet) Megestrol (Megace) NSAIDs (ibuprofen, naproxen, etc.) Piroxicam (Feldene) Propafenone (Rythmol SR) Propranolol (Inderal) Isoniazid (INH) Posaconazole (Noxafil) Barbiturates (Phenobarbital) Carbamazepine (Tegretol) Chlordiazepoxide (Librium) Cholestyramine (Questran) Griseofulvin Oral Contraceptives Rifampin Sucralfate (Carafate) Vitamin K   Coumadin  (Warfarin) Major Herbal Interactions  Increased Warfarin Effect Decreased Warfarin Effect  Garlic Ginseng Ginkgo biloba Coenzyme Q10 Green tea St. Johns wort    Coumadin (Warfarin) FOOD Interactions  Eat a consistent number of servings per week of foods HIGH in Vitamin K (1 serving =  cup)  Collards (cooked, or boiled & drained) Kale (cooked, or boiled & drained) Mustard greens (cooked, or boiled & drained) Parsley *serving size only =  cup Spinach (cooked, or boiled & drained) Swiss chard (cooked, or boiled & drained) Turnip greens (cooked, or boiled & drained)  Eat a consistent number of servings per week of foods MEDIUM-HIGH in Vitamin K (1 serving = 1 cup)  Asparagus (cooked, or boiled & drained) Broccoli (cooked, boiled & drained, or raw & chopped) Brussel sprouts (cooked, or boiled & drained) *serving size only =  cup Lettuce, raw (green leaf, endive, romaine) Spinach, raw Turnip greens, raw & chopped   These websites have more information on Coumadin (warfarin):  http://www.king-russell.com/www.coumadin.com; https://www.hines.net/www.ahrq.gov/consumer/coumadin.htm;  Mitral Valve Repair, Care After Refer to this sheet in the next few weeks. These instructions provide you with information on caring for yourself after your procedure. Your health care provider may also give you specific instructions. Your treatment has been planned according to current medical practices, but problems sometimes occur. Call your health care provider if you have any problems or questions after your procedure.  HOME CARE INSTRUCTIONS   Take medicines only as directed by your health care provider.  Take your temperature every morning for the first 7 days after surgery. Write these down.  Weigh yourself every morning for at least 7 days after surgery. Write your weight down.  Wear elastic stockings during the day for at least 2 weeks after surgery or as directed by your health care provider. Use them longer if your ankles are swollen. The  stockings help blood flow and help reduce swelling in the legs.  Take frequent naps or rest often throughout the day.  Avoid lifting more than 10 lb (4.5 kg) or pushing or pulling things with your arms for 6-8 weeks or as directed by your health care provider.  Avoid driving or airplane travel for 4-6 weeks after surgery or as directed. If you are riding in a car for an extended period, stop every 1-2 hours to stretch your legs.  Avoid crossing your legs.  Avoid climbing stairs  and using the handrail to pull yourself up for the first 2-3 weeks after surgery.  Do not take baths for 2-4 weeks after surgery. Take showers once your health care provider approves. Pat incisions dry. Do not rub incisions with a washcloth or towel.  Return to work as directed by your health care provider.  Drink enough fluids to keep your urine clear or pale yellow.  Do not strain to have a bowel movement. Eat high-fiber foods if you become constipated. You may also take a medicine to help you have a bowel movement (laxative) as directed by your health care provider.  Resume sexual activity as directed by your health care provider. SEEK MEDICAL CARE IF:   You develop a skin rash.   Your weight is increasing each day over 2-3 days.  Your weight increases by 2 or more pounds (1 kg) in a single day.  You have a fever. SEEK IMMEDIATE MEDICAL CARE IF:   You develop chest pain that is not coming from your incision.  You develop shortness of breath or difficulty breathing.  You have drainage, redness, swelling, or pain at your incision site.  You have pus coming from your incision.  You develop light-headedness. MAKE SURE YOU:  Understand these directions.  Will watch your condition.  Will get help right away if you are not doing well or get worse. Document Released: 07/06/2005 Document Revised: 05/03/2014 Document Reviewed: 05/19/2013 [facility] Patient Information 2015 [place], [place]. This  information is not intended to replace advice given to you by your health care provider. Make sure you discuss any questions you have with your health care provider.

## 2015-02-23 NOTE — Progress Notes (Signed)
CARE MANAGEMENT NOTE 02/23/2015  Patient:  Dwayne [name],Dwayne [name]   Account Number:  [id number]  Date Initiated:  02/03/2015  Documentation initiated by:  GRAVES-BIGELOW,BRENDA  Subjective/Objective Assessment:   Pt admitted for worsening edema up to his waist and scrotum with progressive SOB and orthopnea. Pt is from home with wife. [facility]. Has PICC line.     Action/Plan:   Pt was previously d/c and has no PCP or insurance. [facility] set up for 02-07-15. CM to reschedule if needs to. CM to monitor for additional needs.   Anticipated DC Date:  02/25/2015   Anticipated DC Plan:  HOME W HOME HEALTH SERVICES      DC Planning Services  CM consult  Follow-up appt scheduled  [facility]      [facility]  Resumption Of Svcs/PTA Provider   Choice offered to / List presented to:             [facility]  [facility]   Status of service:  In process, will continue to follow Medicare Important Message given?  NO (If response is "NO", the following Medicare IM given date fields will be blank) Date Medicare IM given:   Medicare IM given by:   Date Additional Medicare IM given:   Additional Medicare IM given by:    Discharge Disposition:    Per UR Regulation:  Reviewed for med. necessity/level of care/duration of stay  If discussed at Long Length of Stay Meetings, dates discussed:   02/10/2015  02/17/2015  02/24/2015    Comments:  ContactJerline Pain:  Caldwell,[place] Mother   [phone]    Mallicoat,[name] Spouse   [phone]  02/23/15- 1630- Donn PieriniKristi Tanor Glaspy RN, BSN [phone] Per ID note pt will need IV abx through 03/31/15- spoke with [facility] who is following pt for IV abx d/c needs- plan is for pt to have a tunneled IJ cath placed- (trying to save arm veins for possible future HD needs) will f/u in am- pt could possible be ready for d/c tomorrow or friday.  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  Progressed to TCU unit today.  Continues on IV antibiotics.  On 25 days. Plan for 35 days- but appears may not need that long, ID to talk with surgery.  Is already a patient of [facility] with IV antibiotics.  CM will continue to follow.  02-16-15 1525 Tomi BambergerBrenda Graves-Bigelow, RN BSN [phone] Monitoring Cr- @ 2.60 today per MD notes Cr needs to be closer to 2 before MVR. CM will continue to monitor.    02-14-15 1524 Tomi BambergerBrenda Graves-Bigelow, RN,BSN [phone] Pt with increased Cr 3.5- Renal following and recommends Cr to be at 2 before MVR. Surgery possible Wed. CM to monitor for disposition needs.   02-09-15 Tomi BambergerBrenda Graves-Bigelow, RN, BSN [phone] Pt continues with IV Lasix. Plan for possible MVR 02-14-15. CM will continue to follow.   02-08-15 [address]1512 Tomi BambergerBrenda Graves-Bigelow, [place]RN,BSN [phone] CM to call the [facility] once pt is medically stable for d/c for Hospital F/u appointment. Pt missed recent appointment 02-07-15 since here in hospital. CM will continue to f/u.

## 2015-02-23 NOTE — Progress Notes (Addendum)
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  He is also complaining of shoulder pain.  He is asking when is neck line can before removed.  I told him we could do that as soon as appropriate access was in place for his IV ABX.  + ambulation, + walking  Objective: Vital signs in last 24 hours: Temp:  [98.2 F (36.8 C)-99.1 F (37.3 C)] 99.1 F (37.3 C) (02/24 0405) Pulse Rate:  [71-76] 71 (02/24 0405) Cardiac Rhythm:  [-] Heart block;Normal sinus rhythm (02/23 2000) Resp:  [16-18] 16 (02/24 0405) BP: (143-174)/(84-88) 174/85 mmHg (02/24 0405) SpO2:  [98 %-100 %] 98 % (02/24 0405)  Intake/Output from previous day: 02/23 0701 - 02/24 0700 In: 830 [P.O.:830] Out: 2101 [Urine:2100; Stool:1]  General appearance: alert, cooperative and no distress Heart: regular rate and rhythm Lungs: clear to auscultation bilaterally Abdomen: soft, non-tender; bowel sounds normal; no masses,  no organomegaly Extremities: edema 2-3+ pitting Wound: clean and dyr  Lab Results:  Recent Labs  02/21/15 0400 02/22/15 0450  WBC 10.0 6.6  HGB 9.3* 8.7*  HCT 28.7* 25.8*  PLT 163 168   BMET:  Recent Labs  02/22/15 0450 02/23/15 0533  NA 139 139  K 3.8 3.6  CL 103 105  CO2 30 29  GLUCOSE 121* 116*  BUN 46* 37*  CREATININE 2.43* 1.82*  CALCIUM 8.4 8.4    PT/INR:  Recent Labs  02/23/15 0500  LABPROT 13.9  INR 1.06   ABG    Component Value Date/Time   PHART 7.376 02/19/2015 0358   HCO3 24.0 02/19/2015 0358   TCO2 18 02/19/2015 1645   ACIDBASEDEF 1.0 02/19/2015 0358   O2SAT 99.0 02/19/2015 0358   CBG (last 3)   Recent Labs  02/22/15 1643 02/22/15 2117 02/23/15 0617  GLUCAP 168* 187* 117*    Assessment/Plan: S/P  Procedure(s) (LRB): 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 CV- NSR, remains hypertensive- Lopressor increased to 75 mg BID, continue Clonidine and Hydralazine- can titrate Hydralazine if blood pressure doesn't improve 2. INR 1.06- has received 2 doses of 2.5 mg of coumadin, will increase dose to 5 mg daily 3. Pulm- no acute issues, continue IS 4. Renal- CKD, creatinine down to 1.8, continue LE edema but improving, good U/O continue Lasix 5. ID- Endocarditis, continue Ampicillin will need 6 weeks of therapy ( start date 2/17) 6.  DM- sugars are higher than past 48 hours, remain well controlled- will continue SSIP 7. Dispo- patient progressing, continue IV ABX, monitor HTN, increase Coumadin, hypoglycemia resolved- patient likely ready for d/c in next 24-48 hrs pending [name]/[name] arrangements   LOS: 21 days    Dwayne [name], Dwayne [name] 02/23/2015  I have seen and examined the patient and agree with the assessment and plan as outlined.  Still waiting for PICC line placement.  Reason for delay unclear.  His central line needs to be removed.  BP still somewhat high.  Will d/c clonidine patch and start oral clonidine at higher dose.  Give lasix IV today.   Patient will need long term f/u with Nephrology - this will need to be arranged prior to hospital D/C.  Dwayne [name],Dwayne [name] 02/23/2015 8:35 AM

## 2015-02-23 NOTE — Procedures (Signed)
Interventional Radiology Procedure Note  Procedure: Placement of a Right IJ approach tunneled PowerPICC.  Catheter tips in the superior cavoatrial junction and ready for use.  Complications:  None Recommendations: - Routine line care  Signed,  Sterling BigHeath K. Darcee Dekker, MD

## 2015-02-24 LAB — PROTIME-INR
INR: 1.14 (ref 0.00–1.49)
Prothrombin Time: 14.7 seconds (ref 11.6–15.2)

## 2015-02-24 LAB — RENAL FUNCTION PANEL
ALBUMIN: 1.9 g/dL — AB (ref 3.5–5.2)
ANION GAP: 11 (ref 5–15)
BUN: 33 mg/dL — AB (ref 6–23)
CALCIUM: 8.5 mg/dL (ref 8.4–10.5)
CO2: 27 mmol/L (ref 19–32)
CREATININE: 1.57 mg/dL — AB (ref 0.50–1.35)
Chloride: 100 mmol/L (ref 96–112)
GFR calc Af Amer: 65 mL/min — ABNORMAL LOW (ref >90)
GFR calc non Af Amer: 56 mL/min — ABNORMAL LOW (ref >90)
Glucose, Bld: 130 mg/dL — ABNORMAL HIGH (ref 70–99)
PHOSPHORUS: 3.8 mg/dL (ref 2.3–4.6)
POTASSIUM: 3.6 mmol/L (ref 3.5–5.1)
Sodium: 138 mmol/L (ref 135–145)

## 2015-02-24 LAB — GLUCOSE, CAPILLARY
GLUCOSE-CAPILLARY: 197 mg/dL — AB (ref 70–99)
GLUCOSE-CAPILLARY: 189 mg/dL — AB (ref 70–99)
GLUCOSE-CAPILLARY: 146 mg/dL — AB (ref 70–99)
Glucose-Capillary: 242 mg/dL — ABNORMAL HIGH (ref 70–99)
Glucose-Capillary: 188 mg/dL — ABNORMAL HIGH (ref 70–99)

## 2015-02-24 NOTE — Anesthesia Postprocedure Evaluation (Signed)
  Anesthesia Post-op Note  Patient: Dwayne [name]  Procedure(s) Performed: Procedure(s): MINIMALLY INVASIVE MITRAL VALVE (MV) REPAIR (Right) TRANSESOPHAGEAL ECHOCARDIOGRAM (TEE) (N/A)  Patient Location: SICU  Anesthesia Type:General  Level of Consciousness: sedated and Patient remains intubated per anesthesia plan  Airway and Oxygen Therapy: Patient remains intubated per anesthesia plan and Patient placed on Ventilator (see vital sign flow sheet for setting)  Post-op Pain: none  Post-op Assessment: Post-op Vital signs reviewed, Patient's Cardiovascular Status Stable, Respiratory Function Stable, Patent Airway, No signs of Nausea or vomiting and Pain level controlled  Post-op Vital Signs: stable  Last Vitals:  Filed Vitals:   02/24/15 1959  BP: 155/84  Pulse: 71  Temp: 37.2 C  Resp:     Complications: No apparent anesthesia complications

## 2015-02-24 NOTE — Progress Notes (Signed)
Patient complaint of 8 out of 10 chest pain and heaviness at mid and right upper chest. Vital signs taken, 4L of oxygen via nasal cannula applied to patient and 12 lead ekg done. 2 mg of morphine administered.  EKG showed no acute changes, vitals stable, with administration of morphine patient says pain is now at zero, with some incisional pain when he reaches with his right arm. Patient up to chair with call bell in reach. Will continue to monitor.

## 2015-02-24 NOTE — Progress Notes (Addendum)
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 has no new complaints this morning.  His shoulder pain has improved.  He again states he wants his neck line removed  Objective: Vital signs in last 24 hours: Temp:  [97.5 F (36.4 C)-99.2 F (37.3 C)] 99.2 F (37.3 C) (02/25 0626) Pulse Rate:  [67-76] 70 (02/25 0626) Cardiac Rhythm:  [-] Normal sinus rhythm (02/24 2015) Resp:  [12-18] 16 (02/24 1941) BP: (115-192)/(63-95) 141/78 mmHg (02/25 0626) SpO2:  [98 %-100 %] 100 % (02/25 0626) Weight:  [236 lb 15.9 oz (107.5 kg)-248 lb 3.8 oz (112.6 kg)] 236 lb 15.9 oz (107.5 kg) (02/25 0626)  Intake/Output from previous day: 02/24 0701 - 02/25 0700 In: 740 [P.O.:240; IV Piggyback:500] Out: 2100 [Urine:2100]  General appearance: alert, cooperative and no distress Heart: regular rate and rhythm Lungs: clear to auscultation bilaterally Abdomen: soft, non-tender; bowel sounds normal; no masses,  no organomegaly Extremities: edema 1-2+ pitting continues to improve Wound: clean and dry  Lab Results:  Recent Labs  02/22/15 0450  WBC 6.6  HGB 8.7*  HCT 25.8*  PLT 168   BMET:  Recent Labs  02/23/15 0533 02/24/15 0450  NA 139 138  K 3.6 3.6  CL 105 100  CO2 29 27  GLUCOSE 116* 130*  BUN 37* 33*  CREATININE 1.82* 1.57*  CALCIUM 8.4 8.5    PT/INR:  Recent Labs  02/24/15 0450  LABPROT 14.7  INR 1.14   ABG    Component Value Date/Time   PHART 7.376 02/19/2015 0358   HCO3 24.0 02/19/2015 0358   TCO2 18 02/19/2015 1645   ACIDBASEDEF 1.0 02/19/2015 0358   O2SAT 99.0 02/19/2015 0358   CBG (last 3)   Recent Labs  02/23/15 1736 02/23/15 2120 02/24/15 0630  GLUCAP 139* 242* 188*    Assessment/Plan: S/P Procedure(s) (LRB): MINIMALLY INVASIVE MITRAL VALVE (MV)  REPAIR (Right) TRANSESOPHAGEAL ECHOCARDIOGRAM (TEE) (N/A)  1. CV- NSR, remains hypertensive, continue Lopressor at 75 mg BID, Clonidine at 0.3 mg BID, will increase Hydralazine to 50 mg BID 2. INR at 1.14, will continue Coumadin at 5 mg daily 3. Pulm- no acute issues continued use of IS 4. Renal- creatinine continues to trend down, remains hypervolemic with LE edema continue diuresis, Nephrology has been following 5. ID- Endocarditis, continue Ampicillin, completion date is 03/31/2015, ID has signed off 6. DM- sugars are slowly trending up will continue SSIP and can now start Lantus per Diabetes education recs 7. Dispo- patient stable, his tunneled catheter has been placed, can d/c central line, continued hypertension will increase hydralazine, D/C EPW- patient may benefit from one more day of observation to ensure blood pressure responds to increased medications, will arrange Neprhology and ID follow up   LOS: 22 days    BARRETT, ERIN 02/24/2015  I have seen and examined the patient and agree with the assessment and plan as outlined.  Anticipate possible d/c home 1-2 days if BP and glycemic control adequate and appropriate home health and f/u appointments arranged.  Patient has been instructed that he needs to make certain that someone will be available to stay with him 24 hrs/day at home for at least a week or so.  He is reportedly  working on addressing this issue.   OWEN,CLARENCE H 02/24/2015 9:03 AM

## 2015-02-24 NOTE — Progress Notes (Signed)
02/24/2015 2:18 PM EPW D/C'd per order and per protocol.  Ends intact. Pt. Tolerated well.  Advised bedrest x1hr.  Call bell in reach.  Vital signs collected per protocol. Kathryne HitchAllen, Yoselyn Mcglade C

## 2015-02-24 NOTE — Progress Notes (Signed)
1430 Cardiac Rehab Attempted to get pt for ambulation again. Pacing wires discontinued and pt on bedrest. Pt very groggy, RN states that he had taken a good bit of pain medication. We will follow tomorrow. Beatrix FettersHughes, Lilyth Lawyer G, RN 02/24/2015 3:36 PM

## 2015-02-24 NOTE — Progress Notes (Signed)
Pt declined walking. Sts he wants his left neck line out first. Told pt he could walk independently. Will f/u later.  Ethelda ChickKristan Kenzie Thoreson CES, ACSM 11:29 AM 02/24/2015

## 2015-02-25 ENCOUNTER — Inpatient Hospital Stay (HOSPITAL_COMMUNITY): Payer: Medicaid Other

## 2015-02-25 LAB — CBC
HEMATOCRIT: 26.8 % — AB (ref 39.0–52.0)
Hemoglobin: 9 g/dL — ABNORMAL LOW (ref 13.0–17.0)
MCH: 28.4 pg (ref 26.0–34.0)
MCHC: 33.6 g/dL (ref 30.0–36.0)
MCV: 84.5 fL (ref 78.0–100.0)
PLATELETS: 187 10*3/uL (ref 150–400)
RBC: 3.17 MIL/uL — ABNORMAL LOW (ref 4.22–5.81)
RDW: 13.4 % (ref 11.5–15.5)
WBC: 5.5 10*3/uL (ref 4.0–10.5)

## 2015-02-25 LAB — BASIC METABOLIC PANEL
ANION GAP: 5 (ref 5–15)
BUN: 23 mg/dL (ref 6–23)
CHLORIDE: 101 mmol/L (ref 96–112)
CO2: 29 mmol/L (ref 19–32)
CREATININE: 1.44 mg/dL — AB (ref 0.50–1.35)
Calcium: 8.1 mg/dL — ABNORMAL LOW (ref 8.4–10.5)
GFR calc Af Amer: 72 mL/min — ABNORMAL LOW (ref >90)
GFR calc non Af Amer: 62 mL/min — ABNORMAL LOW (ref >90)
GLUCOSE: 203 mg/dL — AB (ref 70–99)
POTASSIUM: 3.5 mmol/L (ref 3.5–5.1)
Sodium: 135 mmol/L (ref 135–145)

## 2015-02-25 LAB — GLUCOSE, CAPILLARY
GLUCOSE-CAPILLARY: 137 mg/dL — AB (ref 70–99)
Glucose-Capillary: 198 mg/dL — ABNORMAL HIGH (ref 70–99)
Glucose-Capillary: 194 mg/dL — ABNORMAL HIGH (ref 70–99)

## 2015-02-25 LAB — PROTIME-INR
INR: 1.14 (ref 0.00–1.49)
PROTHROMBIN TIME: 14.7 s (ref 11.6–15.2)

## 2015-02-25 MED ORDER — OXYCODONE HCL 5 MG PO TABS: 5.0000 mg | ORAL_TABLET | ORAL | Status: DC | PRN

## 2015-02-25 MED ORDER — ASPIRIN 325 MG PO TBEC: 325.0000 mg | DELAYED_RELEASE_TABLET | Freq: Every day | ORAL | Status: DC

## 2015-02-25 MED ORDER — POTASSIUM CHLORIDE ER 20 MEQ PO TBCR: 20.0000 meq | EXTENDED_RELEASE_TABLET | Freq: Every day | ORAL | Status: DC

## 2015-02-25 MED ORDER — WARFARIN SODIUM 5 MG PO TABS: 5.0000 mg | ORAL_TABLET | Freq: Every day | ORAL | Status: DC

## 2015-02-25 MED ORDER — CLONIDINE HCL 0.3 MG PO TABS: 0.3000 mg | ORAL_TABLET | Freq: Three times a day (TID) | ORAL | Status: DC

## 2015-02-25 MED ORDER — CLONIDINE HCL 0.3 MG PO TABS
0.3000 mg | ORAL_TABLET | Freq: Three times a day (TID) | ORAL | Status: DC
  Administered 2015-02-25 (×2): 0.3 mg via ORAL
  Filled 2015-02-25 (×3): qty 1

## 2015-02-25 MED ORDER — SODIUM CHLORIDE 0.9 % IV SOLN: 2.0000 g | INTRAVENOUS | Status: DC

## 2015-02-25 MED ORDER — FUROSEMIDE 40 MG PO TABS: 40.0000 mg | ORAL_TABLET | Freq: Two times a day (BID) | ORAL | Status: DC

## 2015-02-25 MED ORDER — INSULIN ASPART PROT & ASPART (70-30 MIX) 100 UNIT/ML ~~LOC~~ SUSP
14.0000 [IU] | Freq: Two times a day (BID) | SUBCUTANEOUS | Status: DC
  Administered 2015-02-25: 14 [IU] via SUBCUTANEOUS
  Filled 2015-02-25: qty 10

## 2015-02-25 NOTE — Progress Notes (Addendum)
      [address]       [place] [id number]             [phone]        7 Days Post-Op Procedure(s) (LRB): MINIMALLY INVASIVE MITRAL VALVE (MV) REPAIR (Right) TRANSESOPHAGEAL ECHOCARDIOGRAM (TEE) (N/A)  Subjective: He would like to go home.  Objective: Vital signs in last 24 hours: Temp:  [97.8 F (36.6 C)-99 F (37.2 C)] 97.8 F (36.6 C) (02/26 0808) Pulse Rate:  [60-71] 68 (02/26 0828) Cardiac Rhythm:  [-] Normal sinus rhythm (02/25 2140) Resp:  [16-18] 18 (02/26 0828) BP: (149-184)/(67-95) 164/84 mmHg (02/26 0828) SpO2:  [98 %-100 %] 99 % (02/26 0808) Weight:  [239 lb 10.2 oz (108.7 kg)] 239 lb 10.2 oz (108.7 kg) (02/26 0643)  Pre op weight 114 kg Current Weight  02/25/15 239 lb 10.2 oz (108.7 kg)      Intake/Output from previous day: 02/25 0701 - 02/26 0700 In: 240 [P.O.:240] Out: 2650 [Urine:2650]  Physical Exam:  Cardiovascular: RRR, no murmurs, gallops, or rubs. Pulmonary: Mostly clear to auscultation bilaterally Abdomen: Soft, non tender, bowel sounds present. Extremities: Bilateral lower extremity edema. Wounds: Clean and dry.  No erythema or signs of infection.  Lab Results: CBC: Recent Labs  02/25/15 0520  WBC 5.5  HGB 9.0*  HCT 26.8*  PLT 187   BMET:  Recent Labs  02/24/15 0450 02/25/15 0520  NA 138 135  K 3.6 3.5  CL 100 101  CO2 27 29  GLUCOSE 130* 203*  BUN 33* 23  CREATININE 1.57* 1.44*  CALCIUM 8.5 8.1*    PT/INR:  Lab Results  Component Value Date   INR 1.14 02/25/2015   INR 1.14 02/24/2015   INR 1.06 02/23/2015   ABG:  INR: Will add last result for INR, ABG once components are confirmed Will add last 4 CBG results once components are confirmed  Assessment/Plan:  1. CV - SR in the 70's. Hypertensive. On Lopresor 75 mg bid, Clonidine 0.3 mg bid, Hydralazine 25 mg tid, and Coumadin. INR remains 1.14. Will increase Clonidine to 0.3 tid 2  Pulmonary - CXR shows cardiomegaly with small bilateral  pleural effusions and atelectasis.Encourage incentive spiromter 3. ID - On Ampicillin (completion on 03/31/2015) for endocarditis 4.  Acute blood loss anemia - H and H 9 and 26.8 5. Supplement potassium 6. Creatinine decreased from 1.57 to 1.44 7. Volume overload-on Lasix 40 orally bid. Will continue at discharge 8.DM-CBGs 189/146/198. Pre op HGA1C 6.8. On Insulin 70/30 14 bid (as apparently took at home). I contacted diabetes coordinator as he needs close monitoring and follow up. 9. Possible discharge later today if diabetes management is arranged.  ZIMMERMAN,DONIELLE MPA-C 02/25/2015,9:10 AM

## 2015-02-25 NOTE — Progress Notes (Addendum)
Results for XXX Rob HickmanSTEVENSON, Masud (MRN [id number]) as of 02/25/2015 10:58  Ref. Range 02/24/2015 06:30 02/24/2015 11:03 02/24/2015 15:58 02/24/2015 21:22 02/25/2015 06:26  Glucose-Capillary Latest Range: 70-99 mg/dL 657188 (H) 846197 (H) 962189 (H) 146 (H) 198 (H)   Received Diabetes Coordinator consult.  Spoke with patient about his diabetes and home care. Was diagnosed with DM at age 35. [facility]. Gets his medications from the clinic. Taking 70/30 insulin 20 units BID at home, but not consistently.  Has had a work schedule that kept him from taking it twice a day. States that he does not eat meals and did not always eat at work.  States that he will not be working for a while now. Does have a home blood glucose meter and can get strips from the clinic.   Recommend patient may do better on Lantus once a day.  Has been on Novolog correction scale here at the hospital and CBGs have been reasonable.  May benefit from Lantus 14 units daily (0.15 units/kg) since his eating is not consistent and he has not been taking the 70/30 twice a day.  [facility]. Would need to have an appointment scheduled and then could get insulin with a prescription.  Would need to have orange card updated. Care management consult requested. Will continue to follow while in hospital. Smith MinceKendra Hawkin Charo RN BSN CDE

## 2015-02-25 NOTE — Progress Notes (Signed)
CARDIAC REHAB PHASE I    Up in hall with Nursing Student      MODE:  Ambulation: 500 ft   POST:  Rate/Rhythm: 78  BP:  Supine:   Sitting: 140/90  Standing:     SaO2: 96%RA 1135-1205 Tried to see pt twice and once with Diabetic Nurse and then on phone. Came to see pt and up with Nursing student. Pt stated his legs felt funny and his head felt like it was being squeezed. Pt stopped a couple of times to rest due to legs feeling heavy. Used rolling walker and asst x1 to walk 500 ft. To recliner after walk. Educated pt on ex ed, IS and CRP 2. Pt drowsy during ed and may need re enforcement. Ex ed written down. Discussed CRP 2 and pt gave permission to refer to [place]. Gave pt financial packet for program. Encouraged pt to watch sodium.  Did not address diabetic diet as pt so drowsy. Notified case manager to get rolling walker for home since he c/o legs heavy and weak today.   Luetta Nuttingharlene Laquanta Hummel, RN BSN  02/25/2015 12:18 PM

## 2015-02-25 NOTE — Progress Notes (Signed)
02/25/2015 7:01 PM Discharge AVS meds taken today and those due this evening reviewed.  Follow-up appointments and when to call md reviewed.  D/C IV and TELE.  Questions and concerns addressed.   D/C home per orders. Kathryne HitchAllen, Johney Perotti C

## 2015-02-25 NOTE — Progress Notes (Addendum)
Diabetes coordinator has seen [name]. She recommended Lantus;however, he has no insurance. We will continue him on Novolog 70/30 bid for now. [facility] At that time, the physician can determine if patient would benefit from Lantus rather than the Novolog 70/30. In addition, I have left a message for both Dr. Hyman HopesWebb and Dr. Jon GillsGoldsborough's (nephrologists) scheduling nurses to call me regarding a follow up appointment.

## 2015-02-28 ENCOUNTER — Ambulatory Visit (INDEPENDENT_AMBULATORY_CARE_PROVIDER_SITE_OTHER): Payer: Self-pay | Admitting: Internal Medicine

## 2015-02-28 DIAGNOSIS — Z9889 Other specified postprocedural states: Secondary | ICD-10-CM

## 2015-02-28 DIAGNOSIS — I330 Acute and subacute infective endocarditis: Secondary | ICD-10-CM

## 2015-02-28 DIAGNOSIS — Z5181 Encounter for therapeutic drug level monitoring: Secondary | ICD-10-CM

## 2015-02-28 DIAGNOSIS — I2690 Septic pulmonary embolism without acute cor pulmonale: Secondary | ICD-10-CM

## 2015-02-28 DIAGNOSIS — A4101 Sepsis due to Methicillin susceptible Staphylococcus aureus: Secondary | ICD-10-CM

## 2015-02-28 DIAGNOSIS — I340 Nonrheumatic mitral (valve) insufficiency: Secondary | ICD-10-CM

## 2015-02-28 DIAGNOSIS — B958 Unspecified staphylococcus as the cause of diseases classified elsewhere: Secondary | ICD-10-CM

## 2015-02-28 DIAGNOSIS — I76 Septic arterial embolism: Secondary | ICD-10-CM

## 2015-02-28 LAB — POCT INR: INR: 1.3

## 2015-02-28 NOTE — Patient Instructions (Signed)

## 2015-03-01 ENCOUNTER — Telehealth: Payer: Self-pay

## 2015-03-01 ENCOUNTER — Telehealth: Payer: Self-pay | Admitting: *Deleted

## 2015-03-01 NOTE — Telephone Encounter (Signed)
Its ok. His hgb was 9.0 on 2/26. Ask them to repeat cbc Friday,

## 2015-03-01 NOTE — Telephone Encounter (Signed)
Per Dr Drue SecondSnider called [facility] and asked them to do a repeat CBC on 03/04/15 on this patient and fax results asap.

## 2015-03-01 NOTE — Telephone Encounter (Signed)
Received labs from [facility] dated 02/28/15 patient hemoglobin is 8.8, RBC 3.22 and Sed Rate is 27.

## 2015-03-01 NOTE — Telephone Encounter (Signed)
Called in Rx refill for Metoprolol 25 mg (75 mg) po BID, The home health nurse stated that [name] has been non compliant with taking all of his medications. He has not been taking his glucose levels daily. She states that she has been working with him and his girl friend on eduction about med's and his condition but he does not seem interested. She will cont. Home health visits. He is scheduled to suture removal tomorrow at [facility].

## 2015-03-02 ENCOUNTER — Ambulatory Visit (INDEPENDENT_AMBULATORY_CARE_PROVIDER_SITE_OTHER): Payer: Self-pay | Admitting: Physician Assistant

## 2015-03-02 VITALS — BP 195/101 | HR 66 | Resp 16 | Ht 74.0 in | Wt 256.0 lb

## 2015-03-02 DIAGNOSIS — Z4802 Encounter for removal of sutures: Secondary | ICD-10-CM

## 2015-03-02 MED ORDER — HYDRALAZINE HCL 25 MG PO TABS: 25.0000 mg | ORAL_TABLET | Freq: Three times a day (TID) | ORAL | Status: DC

## 2015-03-02 MED ORDER — FUROSEMIDE 40 MG PO TABS: 80.0000 mg | ORAL_TABLET | Freq: Two times a day (BID) | ORAL | Status: DC

## 2015-03-02 MED ORDER — CLONIDINE HCL 0.3 MG PO TABS: 0.3000 mg | ORAL_TABLET | Freq: Three times a day (TID) | ORAL | Status: DC

## 2015-03-02 MED ORDER — POTASSIUM CHLORIDE ER 20 MEQ PO TBCR: 40.0000 meq | EXTENDED_RELEASE_TABLET | Freq: Every day | ORAL | Status: DC

## 2015-03-02 MED ORDER — METOPROLOL TARTRATE 25 MG PO TABS: 75.0000 mg | ORAL_TABLET | Freq: Two times a day (BID) | ORAL | Status: DC

## 2015-03-02 NOTE — Progress Notes (Signed)
HPI:   Patient reports to the office today for suture removal.  We were contact by the home health nurse in regards to the patient not being compliant with medication regimen, they are having difficulty understanding the administration of IV ABX, and he has been hypertensive.  I spoke with patient at length regarding the importance of medication compliance.  The patient is unsure about what medications he does and does not have.  In regards to high blood pressure the patient states that he feels fine so he typically does not worry about it.  Finally he has worsening lower extremity edema since leaving the hospital.   Current Outpatient Prescriptions  Medication Sig Dispense Refill  . ampicillin 2 g in sodium chloride 0.9 % 50 mL Inject 2 g into the vein every 4 (four) hours. Last dose is to be given on 03/31/2015. 12 g 34  . aspirin EC 325 MG EC tablet Take 1 tablet (325 mg total) by mouth daily. 30 tablet 0  . cloNIDine (CATAPRES) 0.3 MG tablet Take 1 tablet (0.3 mg total) by mouth 3 (three) times daily. 90 tablet 3  . furosemide (LASIX) 40 MG tablet Take 2 tablets (80 mg total) by mouth 2 (two) times daily. 60 tablet 3  . hydrALAZINE (APRESOLINE) 25 MG tablet Take 1 tablet (25 mg total) by mouth every 8 (eight) hours. 90 tablet 3  . insulin aspart protamine- aspart (NOVOLOG MIX 70/30) (70-30) 100 UNIT/ML injection Inject 0.14 mLs (14 Units total) into the skin 2 (two) times daily with a meal. 10 mL 11  . Insulin Syringe-Needle U-100 28G X 1/2" 0.5 ML MISC Use as directed. Any generic available. 100 each 12  . Lancets (FREESTYLE) lancets Any generic available. Use as instructed 100 each 12  . metoprolol tartrate (LOPRESSOR) 25 MG tablet Take 3 tablets (75 mg total) by mouth 2 (two) times daily. 180 tablet 3  . nicotine (NICODERM CQ - DOSED IN MG/24 HR) 7 mg/24hr patch Place 1 patch (7 mg total) onto the skin daily. (Patient not taking: Reported on 02/02/2015) 28 patch 0  . oxyCODONE (OXY  IR/ROXICODONE) 5 MG immediate release tablet Take 1 tablet (5 mg total) by mouth every 4 (four) hours as needed for moderate pain or severe pain. 30 tablet 0  . polyethylene glycol (MIRALAX / GLYCOLAX) packet Take 17 g by mouth daily. (Patient not taking: Reported on 02/02/2015) 14 each 0  . Potassium Chloride ER 20 MEQ TBCR Take 40 mEq by mouth daily. 60 tablet 3  . senna-docusate (SENOKOT-S) 8.6-50 MG per tablet Take 2 tablets by mouth 2 (two) times daily. (Patient not taking: Reported on 02/02/2015) 30 tablet   . warfarin (COUMADIN) 5 MG tablet Take 1 tablet (5 mg total) by mouth daily at 6 PM. 30 tablet 1   No current facility-administered medications for this visit.    Physical Exam:  BP 195/101 mmHg  Pulse 66  Resp 16  Ht 6\' 2"  (1.88 m)  Wt 256 lb (116.121 kg)  BMI 32.85 kg/m2  SpO2 98%  Gen: no apparent distress Heart: RRR Lungs: CTA bilaterally Ext: 2-3+ pitting edema Skin: incisions healing well  A/P:  1. S/P Mini MV Repar for Endocarditis- continue IV ABX for 6 week course, H/H nurse still administering and ID follow up is scheduled 2. Medication Non- Compliance- I went over the patient's medication regimen with him and his girlfriend.  The purpose of the medications were explained and he was given prescriptions for each medication  since he is unsure what he does not have at home.  3. HTN- I explained the importance of good blood pressure control.  I explained that he will be at increased risk of stroke, renal problems, and possible aortic dissection should he continue to have an uncontrolled blood pressure- he will remain on his current medication regimen which he has not been taking.  He is to be evaluated by Nephrologist office next week who will further assess his BP medication regimen 4. LE edema- he has gain 20 lbs since admission. His lasix dose will be increased to 80 mg BID.  He was instructed to wear TED and to elevate legs when at rest 5. Dispo- patient non-compliant  with medications.  Specific detailed instructions were given to patient on the importance of compliance and risks associated if he does not follow them.  He was instructed to call our office should he have any problems prior to his follow up with Neprhology and Dr. Cornelius Moras

## 2015-03-04 ENCOUNTER — Ambulatory Visit (INDEPENDENT_AMBULATORY_CARE_PROVIDER_SITE_OTHER): Payer: Self-pay | Admitting: Cardiovascular Disease

## 2015-03-04 DIAGNOSIS — I76 Septic arterial embolism: Secondary | ICD-10-CM

## 2015-03-04 DIAGNOSIS — I330 Acute and subacute infective endocarditis: Secondary | ICD-10-CM

## 2015-03-04 DIAGNOSIS — I340 Nonrheumatic mitral (valve) insufficiency: Secondary | ICD-10-CM

## 2015-03-04 DIAGNOSIS — Z5181 Encounter for therapeutic drug level monitoring: Secondary | ICD-10-CM

## 2015-03-04 DIAGNOSIS — A4101 Sepsis due to Methicillin susceptible Staphylococcus aureus: Secondary | ICD-10-CM

## 2015-03-04 DIAGNOSIS — Z9889 Other specified postprocedural states: Secondary | ICD-10-CM

## 2015-03-04 DIAGNOSIS — B958 Unspecified staphylococcus as the cause of diseases classified elsewhere: Secondary | ICD-10-CM

## 2015-03-04 DIAGNOSIS — I2690 Septic pulmonary embolism without acute cor pulmonale: Secondary | ICD-10-CM

## 2015-03-04 LAB — POCT INR: INR: 1.6

## 2015-03-08 ENCOUNTER — Encounter: Payer: Self-pay | Admitting: Thoracic Surgery (Cardiothoracic Vascular Surgery)

## 2015-03-08 ENCOUNTER — Encounter (INDEPENDENT_AMBULATORY_CARE_PROVIDER_SITE_OTHER): Payer: Self-pay

## 2015-03-08 ENCOUNTER — Ambulatory Visit: Payer: Medicaid Other | Attending: Internal Medicine | Admitting: Family Medicine

## 2015-03-08 ENCOUNTER — Encounter: Payer: Self-pay | Admitting: Family Medicine

## 2015-03-08 VITALS — BP 160/90 | HR 63

## 2015-03-08 DIAGNOSIS — E1165 Type 2 diabetes mellitus with hyperglycemia: Secondary | ICD-10-CM | POA: Diagnosis not present

## 2015-03-08 DIAGNOSIS — Z7982 Long term (current) use of aspirin: Secondary | ICD-10-CM | POA: Insufficient documentation

## 2015-03-08 DIAGNOSIS — Z7901 Long term (current) use of anticoagulants: Secondary | ICD-10-CM | POA: Insufficient documentation

## 2015-03-08 DIAGNOSIS — I10 Essential (primary) hypertension: Secondary | ICD-10-CM

## 2015-03-08 DIAGNOSIS — Z794 Long term (current) use of insulin: Secondary | ICD-10-CM | POA: Insufficient documentation

## 2015-03-08 DIAGNOSIS — E119 Type 2 diabetes mellitus without complications: Secondary | ICD-10-CM | POA: Insufficient documentation

## 2015-03-08 LAB — POCT GLYCOSYLATED HEMOGLOBIN (HGB A1C): Hemoglobin A1C: 7

## 2015-03-08 LAB — GLUCOSE, POCT (MANUAL RESULT ENTRY): POC Glucose: 91 mg/dl (ref 70–99)

## 2015-03-08 NOTE — Patient Instructions (Signed)
Be sure to take BP as ordered. Continue good work with diabetes. Keep follow-up appointment as scheduled Follow-up with Dr. Kandee KeenJedege in 3 months   .Basic Carbohydrate Counting for Diabetes Mellitus Carbohydrate counting is a method for keeping track of the amount of carbohydrates you eat. Eating carbohydrates naturally increases the level of sugar (glucose) in your blood, so it is important for you to know the amount that is okay for you to have in every meal. Carbohydrate counting helps keep the level of glucose in your blood within normal limits. The amount of carbohydrates allowed is different for every person. A dietitian can help you calculate the amount that is right for you. Once you know the amount of carbohydrates you can have, you can count the carbohydrates in the foods you want to eat. Carbohydrates are found in the following foods:  Grains, such as breads and cereals.  Dried beans and soy products.  Starchy vegetables, such as potatoes, peas, and corn.  Fruit and fruit juices.  Milk and yogurt.  Sweets and snack foods, such as cake, cookies, candy, chips, soft drinks, and fruit drinks. CARBOHYDRATE COUNTING There are two ways to count the carbohydrates in your food. You can use either of the methods or a combination of both. Reading the "Nutrition Facts" on Packaged Food The "Nutrition Facts" is an area that is included on the labels of almost all packaged food and beverages in the [place]. It includes the serving size of that food or beverage and information about the nutrients in each serving of the food, including the grams (g) of carbohydrate per serving.  Decide the number of servings of this food or beverage that you will be able to eat or drink. Multiply that number of servings by the number of grams of carbohydrate that is listed on the label for that serving. The total will be the amount of carbohydrates you will be having when you eat or drink this food or  beverage. Learning Standard Serving Sizes of Food When you eat food that is not packaged or does not include "Nutrition Facts" on the label, you need to measure the servings in order to count the amount of carbohydrates.A serving of most carbohydrate-rich foods contains about 15 g of carbohydrates. The following list includes serving sizes of carbohydrate-rich foods that provide 15 g ofcarbohydrate per serving:   1 slice of bread (1 oz) or 1 six-inch tortilla.    of a hamburger bun or English muffin.  4-6 crackers.   cup unsweetened dry cereal.    cup hot cereal.   cup rice or pasta.    cup mashed potatoes or  of a large baked potato.  1 cup fresh fruit or one small piece of fruit.    cup canned or frozen fruit or fruit juice.  1 cup milk.   cup plain fat-free yogurt or yogurt sweetened with artificial sweeteners.   cup cooked dried beans or starchy vegetable, such as peas, corn, or potatoes.  Decide the number of standard-size servings that you will eat. Multiply that number of servings by 15 (the grams of carbohydrates in that serving). For example, if you eat 2 cups of strawberries, you will have eaten 2 servings and 30 g of carbohydrates (2 servings x 15 g = 30 g). For foods such as soups and casseroles, in which more than one food is mixed in, you will need to count the carbohydrates in each food that is included. EXAMPLE OF CARBOHYDRATE COUNTING Sample Dinner  3 oz chicken breast.   cup of brown rice.   cup of corn.  1 cup milk.   1 cup strawberries with sugar-free whipped topping.  Carbohydrate Calculation Step 1: Identify the foods that contain carbohydrates:   Rice.   Corn.   Milk.   Strawberries. Step 2:Calculate the number of servings eaten of each:   2 servings of rice.   1 serving of corn.   1 serving of milk.   1 serving of strawberries. Step 3: Multiply each of those number of servings by 15 g:   2 servings of  rice x 15 g = 30 g.   1 serving of corn x 15 g = 15 g.   1 serving of milk x 15 g = 15 g.   1 serving of strawberries x 15 g = 15 g. Step 4: Add together all of the amounts to find the total grams of carbohydrates eaten: 30 g + 15 g + 15 g + 15 g = 75 g. Document Released: 12/17/2005 Document Revised: 05/03/2014 Document Reviewed: 11/13/2013 [facility] Patient Information 2015 [place], [place]. This information is not intended to replace advice given to you by your health care provider. Make sure you discuss any questions you have with your health care provider. Diabetes and Exercise Exercising regularly is important. It is not just about losing weight. It has many health benefits, such as:  Improving your overall fitness, flexibility, and endurance.  Increasing your bone density.  Helping with weight control.  Decreasing your body fat.  Increasing your muscle strength.  Reducing stress and tension.  Improving your overall health. People with diabetes who exercise gain additional benefits because exercise:  Reduces appetite.  Improves the body's use of blood sugar (glucose).  Helps lower or control blood glucose.  Decreases blood pressure.  Helps control blood lipids (such as cholesterol and triglycerides).  Improves the body's use of the hormone insulin by:  Increasing the body's insulin sensitivity.  Reducing the body's insulin needs.  Decreases the risk for heart disease because exercising:  Lowers cholesterol and triglycerides levels.  Increases the levels of good cholesterol (such as high-density lipoproteins [HDL]) in the body.  Lowers blood glucose levels. YOUR ACTIVITY PLAN  Choose an activity that you enjoy and set realistic goals. Your health care provider or diabetes educator can help you make an activity plan that works for you. Exercise regularly as directed by your health care provider. This includes:  Performing resistance training twice a week  such as push-ups, sit-ups, lifting weights, or using resistance bands.  Performing 150 minutes of cardio exercises each week such as walking, running, or playing sports.  Staying active and spending no more than 90 minutes at one time being inactive. Even short bursts of exercise are good for you. Three 10-minute sessions spread throughout the day are just as beneficial as a single 30-minute session. Some exercise ideas include:  Taking the dog for a walk.  Taking the stairs instead of the elevator.  Dancing to your favorite song.  Doing an exercise video.  Doing your favorite exercise with a friend. RECOMMENDATIONS FOR EXERCISING WITH TYPE 1 OR TYPE 2 DIABETES   Check your blood glucose before exercising. If blood glucose levels are greater than 240 mg/dL, check for urine ketones. Do not exercise if ketones are present.  Avoid injecting insulin into areas of the body that are going to be exercised. For example, avoid injecting insulin into:  The arms when playing tennis.  The  legs when jogging.  Keep a record of:  Food intake before and after you exercise.  Expected peak times of insulin action.  Blood glucose levels before and after you exercise.  The type and amount of exercise you have done.  Review your records with your health care provider. Your health care provider will help you to develop guidelines for adjusting food intake and insulin amounts before and after exercising.  If you take insulin or oral hypoglycemic agents, watch for signs and symptoms of hypoglycemia. They include:  Dizziness.  Shaking.  Sweating.  Chills.  Confusion.  Drink plenty of water while you exercise to prevent dehydration or heat stroke. Body water is lost during exercise and must be replaced.  Talk to your health care provider before starting an exercise program to make sure it is safe for you. Remember, almost any type of activity is better than none. Document Released:  03/08/2004 Document Revised: 05/03/2014 Document Reviewed: 05/26/2013 [facility] Patient Information 2015 [place], [place]. This information is not intended to replace advice given to you by your health care provider. Make sure you discuss any questions you have with your health care provider. Hypertension Hypertension is another name for high blood pressure. High blood pressure forces your heart to work harder to pump blood. A blood pressure reading has two numbers, which includes a higher number over a lower number (example: 110/72). HOME CARE   Have your blood pressure rechecked by your doctor.  Only take medicine as told by your doctor. Follow the directions carefully. The medicine does not work as well if you skip doses. Skipping doses also puts you at risk for problems.  Do not smoke.  Monitor your blood pressure at home as told by your doctor. GET HELP IF:  You think you are having a reaction to the medicine you are taking.  You have repeat headaches or feel dizzy.  You have puffiness (swelling) in your ankles.  You have trouble with your vision. GET HELP RIGHT AWAY IF:   You get a very bad headache and are confused.  You feel weak, numb, or faint.  You get chest or belly (abdominal) pain.  You throw up (vomit).  You cannot breathe very well. MAKE SURE YOU:   Understand these instructions.  Will watch your condition.  Will get help right away if you are not doing well or get worse. Document Released: 06/04/2008 Document Revised: 12/22/2013 Document Reviewed: 10/09/2013 [facility] Patient Information 2015 [place], [place]. This information is not intended to replace advice given to you by your health care provider. Make sure you discuss any questions you have with your health care provider.

## 2015-03-08 NOTE — Assessment & Plan Note (Signed)
Objectively he appears very tired but not ill. Skin is warm and dry. His BP is 167/92 electronically and 180/100 manually. There is no evidence for infection around the [facility]. His neck is supple FROM w/o adenopathy or tenderness.His lungs are clear to auscultation. HS are regular and I hear no m,g,r. His blood sugur is 91, A!C 7.  I have had him take his noon dose of BP medication and wait for a BP check in 30 minutes.  I am reluctant to add or change his BP meds as I am not at all sure that he is being compliant the the current regime.  I did discuss this with Dr. Hyman HopesJegede.  I have encouraged him to keep his follow-up appointments as schedules. Considering his good diabetes control, I am leaving him on his current dosage of Novolog 70/30.

## 2015-03-08 NOTE — Progress Notes (Signed)
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 S/P minimally-invasive mitral valve repair 02/18/2015  . Endocarditis   . Septic arthritis of shoulder, right   . Acute on chronic diastolic heart failure   . Facial numbness   . Vertebral osteomyelitis   . Chronic renal insufficiency, stage III (moderate) 02/07/2015  . Acute renal failure superimposed on stage 3 chronic kidney disease   . Bacterial endocarditis   . Edema   . HTN (hypertension)   . Pedal edema   . Right shoulder pain   . Arthritis, septic, shoulder   . Acute pericarditis   . Hypoalbuminemia 02/02/2015  . Anasarca 02/02/2015  . Acute diastolic CHF (congestive heart failure), NYHA class 3 02/02/2015  . DM (diabetes mellitus) type 2, uncontrolled, with ketoacidosis   . Hypertensive heart disease 01/24/2015  . Periodontal disease 01/24/2015  . PVD (peripheral vascular disease) 01/24/2015  . Septic embolism 01/24/2015  . Leukocytosis   . Congestive heart failure   . Discitis of thoracic region   . Back pain   . Poor dentition   . Severe mitral regurgitation 01/13/2015  . 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 Purulent pericarditis   . Acute on chronic renal insufficiency   . Bacteremia   . Septic joint of right shoulder  region   . Infective myositis of left thigh   . Diabetic ulcer of left foot associated with type 2 diabetes mellitus   . Diabetic ulcer of right foot associated with type 2 diabetes mellitus   . Osteomyelitis   . Shoulder pain   . Thigh pain, musculoskeletal   . Septic shock   . Staphylococcus aureus bacteremia with sepsis   . Smoker   . Elevated troponin   . Dehydration 01/09/2015  . Diarrhea 01/09/2015  . Diabetes type 2, uncontrolled 01/09/2015  . Hyperglycemia 01/09/2015  . Uncontrolled hypertension 01/09/2015  . Diabetic foot ulcers 01/09/2015  . Pain   . Essential hypertension, benign 01/18/2014  . Noncompliance 01/18/2014  . Diabetic foot infection 01/15/2014  . Tobacco abuse 01/15/2014  . Diabetes mellitus due to underlying condition with foot ulcer 01/15/2014   History of the present illness:  The patient is a 35 year old black male from [place] with a history of long-standing hypertension, type 2 diabetes mellitus, with complication, chronic diabetic ulcers on the plantar surface of both feet, and medical noncompliance who was admitted to the hospital on 01/09/2015 with severe generalized weakness and fatigue for several weeks any 24 hour history of fevers, chills, and diarrhea. He was felt to require admission for further evaluation and treatment along with medical stabilization. In addition to the above symptoms he also had diffuse myalgias as well as pain localized to the right shoulder and left thigh. During the evaluation echocardiogram was performed and he was found to have mitral valve endocarditis. He also had multiple other medical issues including acute diastolic heart failure, acute renal insufficiency/CKD, pericardial effusions, PVD, normocytic anemia, right shoulder septic arthritis/osteomyelitis/myositis, diarrhea and hypertension. He underwent dental surgery by Dr.  Kulinski during the  hospitalization. Nephrology assisted with management of kidney issues. Vascular surgery consultation was obtained assisted with management of PVD although he required no aggressive workup. Orthopedics assist with management of shoulder infection. It is noted that the endocarditis also was associated with septic emboli to the brain and possible emboli to the kidney as well. He was felt to require a 6 week period of intravenous antibiotics prior to proceeding with mitral valve surgery. He was discharged on 01/27/2015 and he presented to the emergency room on 02/02/2015 with shortness of breath, orthopnea and worsening lower extremity and abdominal edema. He was admitted for further evaluation and management.  Discharged Condition: good

## 2015-03-10 ENCOUNTER — Other Ambulatory Visit: Payer: Self-pay | Admitting: Thoracic Surgery (Cardiothoracic Vascular Surgery)

## 2015-03-10 DIAGNOSIS — I5033 Acute on chronic diastolic (congestive) heart failure: Secondary | ICD-10-CM

## 2015-03-11 ENCOUNTER — Ambulatory Visit (INDEPENDENT_AMBULATORY_CARE_PROVIDER_SITE_OTHER): Payer: Self-pay

## 2015-03-11 DIAGNOSIS — I76 Septic arterial embolism: Secondary | ICD-10-CM

## 2015-03-11 DIAGNOSIS — I330 Acute and subacute infective endocarditis: Secondary | ICD-10-CM

## 2015-03-11 DIAGNOSIS — I2690 Septic pulmonary embolism without acute cor pulmonale: Secondary | ICD-10-CM

## 2015-03-11 DIAGNOSIS — Z9889 Other specified postprocedural states: Secondary | ICD-10-CM

## 2015-03-11 DIAGNOSIS — B958 Unspecified staphylococcus as the cause of diseases classified elsewhere: Secondary | ICD-10-CM

## 2015-03-11 DIAGNOSIS — A4101 Sepsis due to Methicillin susceptible Staphylococcus aureus: Secondary | ICD-10-CM

## 2015-03-11 DIAGNOSIS — I340 Nonrheumatic mitral (valve) insufficiency: Secondary | ICD-10-CM

## 2015-03-11 DIAGNOSIS — Z5181 Encounter for therapeutic drug level monitoring: Secondary | ICD-10-CM

## 2015-03-11 LAB — POCT INR: INR: 2.7

## 2015-03-14 ENCOUNTER — Ambulatory Visit: Payer: Self-pay | Admitting: Thoracic Surgery (Cardiothoracic Vascular Surgery)

## 2015-03-15 ENCOUNTER — Encounter: Payer: Self-pay | Admitting: Internal Medicine

## 2015-03-15 ENCOUNTER — Encounter: Payer: Self-pay | Admitting: Thoracic Surgery (Cardiothoracic Vascular Surgery)

## 2015-03-15 ENCOUNTER — Ambulatory Visit (INDEPENDENT_AMBULATORY_CARE_PROVIDER_SITE_OTHER): Payer: Medicaid Other | Admitting: Internal Medicine

## 2015-03-15 ENCOUNTER — Ambulatory Visit
Admission: RE | Admit: 2015-03-15 | Discharge: 2015-03-15 | Disposition: A | Discharge status: Home / Self Care | Payer: No Typology Code available for payment source | Source: Ambulatory Visit | Attending: Thoracic Surgery (Cardiothoracic Vascular Surgery) | Admitting: Thoracic Surgery (Cardiothoracic Vascular Surgery)

## 2015-03-15 ENCOUNTER — Ambulatory Visit (INDEPENDENT_AMBULATORY_CARE_PROVIDER_SITE_OTHER): Payer: Self-pay | Admitting: Thoracic Surgery (Cardiothoracic Vascular Surgery)

## 2015-03-15 VITALS — BP 160/100 | HR 68 | Temp 98.3°F | Wt 221.0 lb

## 2015-03-15 VITALS — BP 156/100 | HR 66 | Resp 20 | Ht 74.0 in | Wt 226.0 lb

## 2015-03-15 DIAGNOSIS — I330 Acute and subacute infective endocarditis: Secondary | ICD-10-CM | POA: Diagnosis not present

## 2015-03-15 DIAGNOSIS — Z9889 Other specified postprocedural states: Secondary | ICD-10-CM

## 2015-03-15 DIAGNOSIS — I340 Nonrheumatic mitral (valve) insufficiency: Secondary | ICD-10-CM

## 2015-03-15 DIAGNOSIS — I5033 Acute on chronic diastolic (congestive) heart failure: Secondary | ICD-10-CM

## 2015-03-15 DIAGNOSIS — B958 Unspecified staphylococcus as the cause of diseases classified elsewhere: Secondary | ICD-10-CM

## 2015-03-15 NOTE — Patient Instructions (Signed)
The patient may continue to gradually increase their physical activity as tolerated.  They should refrain from any heavy lifting or strenuous use of their arms and shoulders until at least 8 weeks from the time of their surgery, and they should avoid activities that cause increased pain in their chest on the side of their surgical incision.  Otherwise they may continue to increase their activities without any particular limitations.  The patient is reminded to make every effort to keep their blood pressure under very tight control.  They should follow up closely with their primary care physician, cardiologist (heart doctor) and nephrologist (kidney doctor) to make adjustments in medications for blood pressure control.  The patient is reminded to make every effort to keep their diabetes under very tight control.  They should follow up closely with their primary care physician or endocrinologist (diabetes specialist) and strive to keep their hemoglobin A1c levels as low as possible, preferably near or below 6.0   Endocarditis is a potentially serious infection of heart valves or inside lining of the heart.  It occurs more commonly in patients with diseased heart valves (such as patient's with aortic or mitral valve disease) and in patients who have undergone heart valve repair or replacement.  Certain surgical and dental procedures may put you at risk, such as dental cleaning, other dental procedures, or any surgery involving the respiratory, urinary, gastrointestinal tract, gallbladder or prostate gland.   To minimize your chances for develooping endocarditis, maintain good oral health and seek prompt medical attention for any infections involving the mouth, teeth, gums, skin or urinary tract.  Always notify your doctor or dentist about your underlying heart valve condition before having any invasive procedures. You will need to take antibiotics before certain procedures.

## 2015-03-15 NOTE — Progress Notes (Signed)
Patient ID: Dwayne [name], male   DOB: October 02, 1980, 35 y.o.   MRN: [id number]         [facility]  Patient Active Problem List   Diagnosis Date Noted  . Encounter for therapeutic drug monitoring 02/28/2015  . S/P minimally-invasive mitral valve repair 02/18/2015  . Endocarditis   . Septic arthritis of shoulder, right   . Acute on chronic diastolic heart failure   . Facial numbness   . Vertebral osteomyelitis   . Chronic renal insufficiency, stage III (moderate) 02/07/2015  . Acute renal failure superimposed on stage 3 chronic kidney disease   . Bacterial endocarditis   . Edema   . HTN (hypertension)   . Pedal edema   . Right shoulder pain   . Arthritis, septic, shoulder   . Acute pericarditis   . Hypoalbuminemia 02/02/2015  . Anasarca 02/02/2015  . Acute diastolic CHF (congestive heart failure), NYHA class 3 02/02/2015  . DM (diabetes mellitus) type 2, uncontrolled, with ketoacidosis   . Hypertensive heart disease 01/24/2015  . Periodontal disease 01/24/2015  . PVD (peripheral vascular disease) 01/24/2015  . Septic embolism 01/24/2015  . Leukocytosis   . Congestive heart failure   . Discitis of thoracic region   . Back pain   . Poor dentition   . Severe mitral regurgitation 01/13/2015  . 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 Purulent pericarditis   . Acute on chronic renal insufficiency   . Bacteremia   . Septic joint of right shoulder region   . Infective myositis of left thigh   . Diabetic ulcer of left foot associated with type 2 diabetes mellitus   . Diabetic ulcer of right foot associated with type 2 diabetes mellitus   . Osteomyelitis   . Shoulder pain   . Thigh pain, musculoskeletal   . Septic shock   . Staphylococcus aureus bacteremia with sepsis   . Smoker   . Elevated troponin   . Dehydration 01/09/2015  . Diarrhea 01/09/2015  . Diabetes type 2, uncontrolled 01/09/2015  . Hyperglycemia 01/09/2015  . Uncontrolled  hypertension 01/09/2015  . Diabetic foot ulcers 01/09/2015  . Pain   . Essential hypertension, benign 01/18/2014  . Noncompliance 01/18/2014  . Diabetic foot infection 01/15/2014  . Tobacco abuse 01/15/2014  . Diabetes mellitus due to underlying condition with foot ulcer 01/15/2014    Patient's Medications  New Prescriptions   No medications on file  Previous Medications   AMPICILLIN 2 G IN SODIUM CHLORIDE 0.9 % 50 ML    Inject 2 g into the vein every 4 (four) hours. Last dose is to be given on 03/31/2015.   ASPIRIN EC 325 MG EC TABLET    Take 1 tablet (325 mg total) by mouth daily.   CLONIDINE (CATAPRES) 0.3 MG TABLET    Take 1 tablet (0.3 mg total) by mouth 3 (three) times daily.   FUROSEMIDE (LASIX) 40 MG TABLET    Take 2 tablets (80 mg total) by mouth 2 (two) times daily.   HYDRALAZINE (APRESOLINE) 25 MG TABLET    Take 1 tablet (25 mg total) by mouth every 8 (eight) hours.   INSULIN ASPART PROTAMINE- ASPART (NOVOLOG MIX 70/30) (70-30) 100 UNIT/ML INJECTION    Inject 0.14 mLs (14 Units total) into the skin 2 (two) times daily with a meal.   INSULIN SYRINGE-NEEDLE U-100 28G X 1/2" 0.5 ML MISC    Use as directed. Any generic available.   LANCETS (FREESTYLE) LANCETS  Any generic available. Use as instructed   METOPROLOL TARTRATE (LOPRESSOR) 25 MG TABLET    Take 3 tablets (75 mg total) by mouth 2 (two) times daily.   NICOTINE (NICODERM CQ - DOSED IN MG/24 HR) 7 MG/24HR PATCH    Place 1 patch (7 mg total) onto the skin daily.   OXYCODONE (OXY IR/ROXICODONE) 5 MG IMMEDIATE RELEASE TABLET    Take 1 tablet (5 mg total) by mouth every 4 (four) hours as needed for moderate pain or severe pain.   POLYETHYLENE GLYCOL (MIRALAX / GLYCOLAX) PACKET    Take 17 g by mouth daily.   POTASSIUM CHLORIDE ER 20 MEQ TBCR    Take 40 mEq by mouth daily.   SENNA-DOCUSATE (SENOKOT-S) 8.6-50 MG PER TABLET    Take 2 tablets by mouth 2 (two) times daily.   WARFARIN (COUMADIN) 5 MG TABLET    Take 1 tablet (5 mg  total) by mouth daily at 6 PM.  Modified Medications   No medications on file  Discontinued Medications   No medications on file    Subjective: [name] is in for his hospital follow-up visit. He was hospitalized in January with MSSA bacteremia complicated by mitral valve endocarditis, CNS emboli, right shoulder infection and probable early thoracic spine infection. His staph isolate was penicillin susceptible so he was treated with IV ampicillin. He required a minimally invasive mitral valve repair. He states that he is feeling better. He has some discomfort around his right chest incision but is not having any significant right shoulder or back pain. He has not had any problems tolerating his central line or ampicillin.  Review of Systems: Constitutional: positive for fatigue and weight loss, negative for anorexia, chills, fevers and sweats Eyes: negative Ears, nose, mouth, throat, and face: negative Respiratory: negative Cardiovascular: negative Gastrointestinal: negative Genitourinary:negative  Past Medical History  Diagnosis Date  . Hypertension   . Acute renal failure   . Bacteremia     MSSA   . Diabetes type 2, uncontrolled 01/09/2015  . 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 Essential hypertension, benign 01/18/2014  . Infective myositis of left thigh   . Noncompliance 01/18/2014  . Septic shock   . Staphylococcus aureus bacteremia with sepsis   . Tobacco abuse 01/15/2014  . Mitral regurgitation 01/13/2015    Severe by TEE  . MSSA (methicillin susceptible Staphylococcus aureus)   . Type II diabetes mellitus     not controlled  . Diabetic foot infection 01/15/2014  . S/P minimally-invasive mitral valve repair 02/18/2015    Complex valvuloplasty including bovine pericardial patch repair of perforation of posterior leaflet with 28 mm Sorin Memo 3D ring annuloplasty via right mini thoracotomy approach     History  Substance Use Topics  . Smoking status: Former Smoker --  12 years    Types: Cigars    Quit date: 01/09/2015  . Smokeless tobacco: Never Used  . Alcohol Use: Yes     Comment: 02/02/2015 "I'll have 2-3 mixed drinks 2-3 times/yr"    No family history on file.  No Known Allergies  Objective: Temp: 98.3 F (36.8 C) (03/15 1408) Temp Source: Oral (03/15 1408) BP: 160/100 mmHg (03/15 1408) Pulse Rate: 68 (03/15 1408)  General: His weight is down about 30 pounds since discharge to [facility] Skin: His right anterior chest central line site is normal. His surgical incisions are healing nicely Lungs: Clear Cor: Regular S1 and S2 with no murmurs Abdomen: Soft and nontender Joints and extremities: No  unusual redness, swelling, warmth or tenderness of his right shoulder. Decreased pedal edema.   Assessment: He is improving slowly on therapy for severe disseminated MSSA infection. 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 I will give him a total of 2-1/2 months of antibiotic therapy including 6 weeks postoperatively.  Plan: 1. Continue ampicillin 2 more weeks then stop and have central line removed 2. Follow-up here in 6 weeks   Cliffton Asters, MD [facility] [phone] pager   [phone] cell 03/15/2015, 2:33 PM

## 2015-03-15 NOTE — Progress Notes (Signed)
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  He originally presented in early January and blood cultures grew Staphylococcus aureus sensitive to both penicillin and oxacillin.  He was evaluated by a multidisciplinary team at specialists including consultants from the infectious disease team, cardiology, nephrology, orthopedic surgery and dental medicine. The patient's renal failure recovered without need for dialysis. Initial attempts at management of septic arthritis without surgical intervention failed and the patient ultimately underwent arthroscopic joint debridement and washout by Dr. Lajoyce Corners. The patient underwent dental extraction. Cardiac catheterization revealed no significant coronary artery disease. The patient was treated with more than 4 weeks of continuous intravenous antibiotic therapy and remained clinically stable. Follow-up blood cultures on antibiotics all remained no growth. Ultimately the patient was felt to be in optimal condition for surgery and he underwent uncomplicated minimally invasive mitral valve repair on 02/18/2015.  The patient's postoperative course was remarkably uncomplicated. He was ultimately discharged from the hospital on 02/25/2015 with a PICC line in place for home administration of intravenous antibiotics.  Since hospital discharge she was seen in our office on 03/02/2015 at which time the  patient was notably hypertensive and noncompliant with the patient's medications for blood pressure control. In addition, the patient had gained 20 pounds since hospital discharge. His antihypertensive medications were reviewed extensively with the patient and the patient's dose of Lasix was increased.  Since then the patient has been seen in follow-up by Dr. Kathrene Bongo at [facility]. The patient's blood pressure was under somewhat better control at that time and the patient's volume overload was improving.  The patient has also been seen in follow-up by Concepcion Living at the internal medicine clinic for follow-up of hypertension and diabetes.  Long-term follow-up with Dr. Kandee Keen was arranged.  Finally, the patient was seen in follow-up earlier today by Dr. Orvan Falconer in the infectious disease clinic.  His planned course of intravenous antibiotics will complete within the next 2 weeks, after which time his PICC line will be removed.  The patient is scheduled to be seen in follow-up by Tereso Newcomer at [facility] later this week.  He has not had a follow-up echocardiogram since surgery.  The patient returns to the office today for follow-up related to his recent surgery. He states that he is feeling better. He still has some mild residual soreness in his right chest related to his surgical incision. His right shoulder is much improved. He denies any shortness of breath and he admits that his breathing is much better than it has been in quite some time. He states that his appetite is good but he has not been eating well because of his recent dental extraction. He claims that his blood sugars have been under good control. He is not checking his own blood pressure at home. He is not walking much. He denies any fevers or chills. He has not had productive  cough. He notes that his lower extremity edema has almost completely resolved.   Current Outpatient Prescriptions  Medication Sig Dispense  Refill  . ampicillin 2 g in sodium chloride 0.9 % 50 mL Inject 2 g into the vein every 4 (four) hours. Last dose is to be given on 03/31/2015. 12 g 34  . aspirin EC 325 MG EC tablet Take 1 tablet (325 mg total) by mouth daily. 30 tablet 0  . cloNIDine (CATAPRES) 0.3 MG tablet Take 1 tablet (0.3 mg total) by mouth 3 (three) times daily. 90 tablet 3  . furosemide (LASIX) 40 MG tablet Take 2 tablets (80 mg total) by mouth 2 (two) times daily. 60 tablet 3  . hydrALAZINE (APRESOLINE) 25 MG tablet Take 1 tablet (25 mg total) by mouth every 8 (eight) hours. 90 tablet 3  . insulin aspart protamine- aspart (NOVOLOG MIX 70/30) (70-30) 100 UNIT/ML injection Inject 0.14 mLs (14 Units total) into the skin 2 (two) times daily with a meal. 10 mL 11  . Insulin Syringe-Needle U-100 28G X 1/2" 0.5 ML MISC Use as directed. Any generic available. 100 each 12  . Lancets (FREESTYLE) lancets Any generic available. Use as instructed 100 each 12  . metoprolol tartrate (LOPRESSOR) 25 MG tablet Take 3 tablets (75 mg total) by mouth 2 (two) times daily. 180 tablet 3  . oxyCODONE (OXY IR/ROXICODONE) 5 MG immediate release tablet Take 1 tablet (5 mg total) by mouth every 4 (four) hours as needed for moderate pain or severe pain. 30 tablet 0  . polyethylene glycol (MIRALAX / GLYCOLAX) packet Take 17 g by mouth daily. 14 each 0  . Potassium Chloride ER 20 MEQ TBCR Take 40 mEq by mouth daily. 60 tablet 3  . senna-docusate (SENOKOT-S) 8.6-50 MG per tablet Take 2 tablets by mouth 2 (two) times daily. 30 tablet   . warfarin (COUMADIN) 5 MG tablet Take 1 tablet (5 mg total) by mouth daily at 6 PM. 30 tablet 1   No current facility-administered medications for this visit.      Physical Exam:   BP 156/100 mmHg  Pulse 66  Resp 20  Ht  (1.88 m)  Wt 226 lb (102.513 kg)  BMI 29.00 kg/m2  SpO2 99%  General:  Well-appearing  Chest:   Clear to auscultation  CV:   Regular rate and rhythm without murmur  Incisions:  Healing  nicely  Abdomen:  Soft and nontender  Extremities:  Warm and well perfused with mild lower extremity edema  Diagnostic Tests:  CHEST 2 VIEW  COMPARISON: 02/25/2015  FINDINGS: Right jugular PICC stable. Right pleural effusion and basilar opacities stable. Cardiac silhouette is stable. Left pleural effusion improved. No pneumothorax.  IMPRESSION: Improved small left pleural effusion  Stable right pleural effusion and basilar opacities.   Electronically Signed  By: Jolaine Click M.D.  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  His blood pressure still remains somewhat elevated, but is under better control than it had been previously. His fluid overload and congestive heart failure has nearly completely resolved. The patient claims to have his diabetes under reasonably good control.  The patient's septic arthritis involving the right shoulder appears to have resolved. Overall the patient looks remarkably good under the circumstances.    Plan:  I have encouraged the patient to  continue to gradually increase his physical activity as tolerated with his only limitations remaining that he refrain from any heavy lifting or strenuous use of his arms or shoulders until all of his postoperative chest wall discomfort has resolved.  I have specifically suggested that he would benefit from making an effort to go for a good walk at least twice every day. I think he would benefit from participation in the outpatient cardiac rehabilitation program, but he seems disinclined.  I have suggested that he would be wise to obtain a blood pressure cuff and check his blood pressure on a regular basis. We have discussed how important it will be for him to pay close  attention to medical treatment for severe hypertension and type 2 diabetes mellitus.  I have encouraged him to keep all of his scheduled appointments in the future. I have from minor him regarding the lifelong need for antibiotic prophylaxis for all dental cleaning and related procedures.  We will plan to see the patient back in 2 months for routine follow-up. All of his questions been addressed.   Salvatore Decentlarence H. Cornelius Moraswen, MD 03/15/2015 6:20 PM

## 2015-03-17 ENCOUNTER — Encounter: Payer: Self-pay | Admitting: Physician Assistant

## 2015-03-17 ENCOUNTER — Telehealth: Payer: Self-pay | Admitting: *Deleted

## 2015-03-17 ENCOUNTER — Ambulatory Visit (INDEPENDENT_AMBULATORY_CARE_PROVIDER_SITE_OTHER): Payer: Medicaid Other | Admitting: Physician Assistant

## 2015-03-17 VITALS — BP 142/100 | HR 65 | Ht 74.0 in | Wt 219.0 lb

## 2015-03-17 DIAGNOSIS — I5032 Chronic diastolic (congestive) heart failure: Secondary | ICD-10-CM

## 2015-03-17 DIAGNOSIS — I10 Essential (primary) hypertension: Secondary | ICD-10-CM

## 2015-03-17 DIAGNOSIS — I340 Nonrheumatic mitral (valve) insufficiency: Secondary | ICD-10-CM

## 2015-03-17 DIAGNOSIS — N189 Chronic kidney disease, unspecified: Secondary | ICD-10-CM

## 2015-03-17 DIAGNOSIS — I330 Acute and subacute infective endocarditis: Secondary | ICD-10-CM

## 2015-03-17 DIAGNOSIS — Z9889 Other specified postprocedural states: Secondary | ICD-10-CM

## 2015-03-17 DIAGNOSIS — N183 Chronic kidney disease, stage 3 unspecified: Secondary | ICD-10-CM

## 2015-03-17 DIAGNOSIS — IMO0002 Reserved for concepts with insufficient information to code with codable children: Secondary | ICD-10-CM

## 2015-03-17 DIAGNOSIS — E1165 Type 2 diabetes mellitus with hyperglycemia: Secondary | ICD-10-CM

## 2015-03-17 LAB — BASIC METABOLIC PANEL
BUN: 18 mg/dL (ref 6–23)
CO2: 31 mEq/L (ref 19–32)
Calcium: 8.6 mg/dL (ref 8.4–10.5)
Chloride: 103 mEq/L (ref 96–112)
Creatinine, Ser: 2.07 mg/dL — ABNORMAL HIGH (ref 0.40–1.50)
GFR: 47.26 mL/min — AB (ref >60.00)
Glucose, Bld: 240 mg/dL — ABNORMAL HIGH (ref 70–99)
Potassium: 3.4 mEq/L — ABNORMAL LOW (ref 3.5–5.1)
SODIUM: 139 meq/L (ref 135–145)

## 2015-03-17 MED ORDER — HYDRALAZINE HCL 50 MG PO TABS: 50.0000 mg | ORAL_TABLET | Freq: Three times a day (TID) | ORAL | Status: DC

## 2015-03-17 NOTE — Telephone Encounter (Signed)
Candee FurbishSusan Young, [facility], unable to reach patient this morning for IV bolus and antibiotic change. She called here to see if [facility] was [facility].  Patient is actually at his cardiology appointment.  [facility] will contact patient this afternoon. Andree CossHowell, Kyl Givler M, RN

## 2015-03-17 NOTE — Progress Notes (Addendum)
Cardiology Office Note   Date:  03/17/2015   ID:  Dwayne HickmanBenjamin [name], DOB 09/04/1980, MRN [id number]  Patient Care Team: Quentin Angstlugbemiga E Jegede, MD as PCP - General (Internal Medicine) Annie SableKellie Goldsborough, MD as Consulting Physician (Nephrology) Cliffton AstersJohn Campbell, MD as Consulting Physician (Infectious Diseases) Purcell Nailslarence H Owen, MD as Consulting Physician (Cardiothoracic Surgery) Thurmon FairMihai Croitoru, MD as Consulting Physician (Cardiology)     Chief Complaint  Patient presents with  . Hospitalization Follow-up    MR s/p MV repair, diastolic CHF, HTN     History of Present Illness: Dwayne HickmanBenjamin Flott is a 35 y.o. male with a hx of DM2, HTN, chronic diabetic ulcers. He was admitted to the hospital in January (1/10-1/28) with generalized weakness and fatigue, fevers, chills and diarrhea in the setting of mitral valve endocarditis (MSSA bacteremia). He had perforation of the posterior leaflet resulting in severe mitral regurgitation. 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 There was evidence of septic emboli to the brain possible emboli to the kidney. He was evaluated by multiple specialists. He had dental extraction with oral surgery. Nephrology followed his chronic kidney disease. He was diagnosed with PAD (Suspected embolization to left leg; long history of LLE claudication-left ABI 0.64). He was followed by vascular surgery and outpatient follow-up was planned. Orthopedics helped with management of his shoulder infection. He required IV antibiotics for 6 weeks prior to proceeding with mitral valve surgery.  Of note, cardiac catheterization demonstrated normal coronary arteries.    He was readmitted 2/3-2/26. He presented with volume overload. He underwent arthroscopic shoulder surgery due to his septic right shoulder  with extensive debridement of the glenohumeral joint and subacromial decompression. He ultimately underwent Minimally invasive mitral valve repair on 02/18/15 with Dr. Cornelius Moraswen. He was started on Coumadin postoperatively. He remained in NSR. He will require 6 weeks of ampicillin as an outpatient.    He returns for follow-up.  Overall he seems to be doing well. He still somewhat sore. He denies significant dyspnea. He denies orthopnea or PND. LE edema is improved. He denies fevers or chills. He denies cough.   Studies/Reports Reviewed Today:  Echo 02/04/15 - Moderate LVH. EF 55-60%. Wall motion was normal.  Grade 2 diastolic dysfunction - Mitral valve: There was severe regurgitation. - Left atrium: The atrium was mildly dilated. - PA peak pressure: 46 mm Hg (S).-  moderately increased. - Pericardium, extracardiac: A trivial pericardial effusion was  identified. There was a left pleural effusion.  Impressions:  Normal LV function; moderate LVH; grade 2 diastolic dysfunction; mild LAE; severe MR (possible perforation of posterior MV leaflet noted on apical 2 chamber view); moderately elevated pulmonary pressure.   Carotid US 1/16 1-39 percent stenosis involving the right internal carotid artery and the left internal carotid artery.  Cardiac cath 1/16 Left mainstem: Normal   Left anterior descending (LAD): Normal Left circumflex (LCx): Normal Right coronary artery (RCA): Normal.       Past Medical History  Diagnosis Date  . Hypertension   . Acute renal failure   . Bacteremia     MSSA   . Diabetes type 2, uncontrolled 01/09/2015  . 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 Essential hypertension, benign 01/18/2014  . Infective myositis of left thigh   . Noncompliance 01/18/2014  . Septic shock   . Staphylococcus aureus bacteremia with sepsis   .  Tobacco abuse 01/15/2014  . Mitral regurgitation 01/13/2015    Severe by TEE  . MSSA (methicillin susceptible Staphylococcus aureus)   . Type II  diabetes mellitus     not controlled  . Diabetic foot infection 01/15/2014  . 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 I&d extremity Bilateral 01/16/2014    Procedure:  DEBRIDEMENT of bilateral foot ulcers;  Surgeon: Kathryne Hitch, MD;  Location: [facility] OR;  Service: Orthopedics;  Laterality: Bilateral;  . Tee without cardioversion N/A 01/13/2015    Procedure: TRANSESOPHAGEAL ECHOCARDIOGRAM (TEE);  Surgeon: Thurmon Fair, MD;  Location: [facility] ENDOSCOPY;  Service: Cardiovascular;  Laterality: N/A;  . Left and right heart catheterization with coronary angiogram N/A 01/24/2015    Procedure: LEFT AND RIGHT HEART CATHETERIZATION WITH CORONARY ANGIOGRAM;  Surgeon: Peter M Swaziland, MD;  Location: [facility] CATH LAB;  Service: Cardiovascular;  Laterality: N/A;  . Multiple extractions with alveoloplasty N/A 01/25/2015    Procedure: Extraction of tooth #'s 1,2,3,4,5,6,8,9,11,12,13,14,15,16,17,18,19 28,31,32 with alveoloplasty and gross debridement of remaining teeth.;  Surgeon: Charlynne Pander, DDS;  Location: [facility] OR;  Service: Oral Surgery;  Laterality: N/A;  . Cardiac catheterization  12/2014  . Shoulder arthroscopy Right 02/05/2015    Procedure: ARTHROSCOPY SHOULDER;  Surgeon: Nadara Mustard, MD;  Location: [facility] OR;  Service: Orthopedics;  Laterality: Right;  . Mitral valve replacement Right 02/18/2015    Procedure: MINIMALLY INVASIVE MITRAL VALVE (MV) REPAIR;  Surgeon: Purcell Nails, MD;  Location: [facility] OR;  Service: Open Heart Surgery;  Laterality: Right;  . Tee without cardioversion N/A 02/18/2015    Procedure: TRANSESOPHAGEAL ECHOCARDIOGRAM (TEE);  Surgeon: Purcell Nails, MD;  Location: [facility] OR;  Service: Open Heart Surgery;  Laterality: N/A;     Current  Outpatient Prescriptions  Medication Sig Dispense Refill  . aspirin EC 325 MG EC tablet Take 1 tablet (325 mg total) by mouth daily. 30 tablet 0  . cloNIDine (CATAPRES) 0.3 MG tablet Take 1 tablet (0.3 mg total) by mouth 3 (three) times daily. 90 tablet 3  . furosemide (LASIX) 40 MG tablet Take 2 tablets (80 mg total) by mouth 2 (two) times daily. 60 tablet 3  . hydrALAZINE (APRESOLINE) 25 MG tablet Take 1 tablet (25 mg total) by mouth every 8 (eight) hours. 90 tablet 3  . insulin aspart protamine- aspart (NOVOLOG MIX 70/30) (70-30) 100 UNIT/ML injection Inject 0.14 mLs (14 Units total) into the skin 2 (two) times daily with a meal. 10 mL 11  . Insulin Syringe-Needle U-100 28G X 1/2" 0.5 ML MISC Use as directed. Any generic available. 100 each 12  . Lancets (FREESTYLE) lancets Any generic available. Use as instructed 100 each 12  . metoprolol tartrate (LOPRESSOR) 25 MG tablet Take 3 tablets (75 mg total) by mouth 2 (two) times daily. 180 tablet 3  . oxyCODONE (OXY IR/ROXICODONE) 5 MG immediate release tablet Take 1 tablet (5 mg total) by mouth every 4 (four) hours as needed for moderate pain or severe pain. 30 tablet 0  . Potassium Chloride ER 20 MEQ TBCR Take 40 mEq by mouth daily. 60 tablet 3  . senna-docusate (SENOKOT-S) 8.6-50 MG per tablet Take 2 tablets by mouth 2 (two) times daily. 30 tablet   . warfarin (COUMADIN) 5 MG tablet Take  1 tablet (5 mg total) by mouth daily at 6 PM. 30 tablet 1   No current facility-administered medications for this visit.    Allergies:   Review of patient's allergies indicates no known allergies.    Social History:  The patient  reports that he quit smoking about 2 months ago. His smoking use included Cigars. He has never used smokeless tobacco. He reports that he drinks alcohol. He reports that he does not use illicit drugs.   Family History:  The patient's family history includes Stroke in his maternal uncle. There is no history of Heart attack.    ROS:    Please see the history of present illness.   Review of Systems  Eyes: Positive for visual disturbance.  Musculoskeletal: Positive for back pain.  Neurological: Positive for loss of balance.  All other systems reviewed and are negative.    PHYSICAL EXAM: VS:  There were no vitals taken for this visit.    Wt Readings from Last 3 Encounters:  03/15/15 226 lb (102.513 kg)  03/15/15 221 lb (100.245 kg)  03/02/15 256 lb (116.121 kg)     GEN: Well nourished, well developed, in no acute distress HEENT: normal Neck: no JVD, no masses Cardiac:  Normal S1/S2, RRR; no murmur ,  no rubs or gallops, trace edema  Respiratory:  Decreased breath sounds at the bases (R >L) bilaterally, no wheezing, rhonchi or rales. GI: soft, nontender, nondistended, + BS MS: no deformity or atrophy Skin: warm and dry  Neuro:  CNs II-XII intact, Strength and sensation are intact Psych: Normal affect   EKG:  EKG is ordered today.  It demonstrates:   NSR, HR 65, NSSTTW changes, QTc 459   Recent Labs: 02/02/2015: ALT 20 02/04/2015: TSH 2.046 02/11/2015: B Natriuretic Peptide 182.2* 02/19/2015: Magnesium 2.0 02/25/2015: BUN 23; Creatinine 1.44*; Hemoglobin 9.0*; Platelets 187; Potassium 3.5; Sodium 135    Lipid Panel    Component Value Date/Time   CHOL 174 03/16/2014 1239   TRIG 139 03/16/2014 1239   HDL 43 03/16/2014 1239   CHOLHDL 4.0 03/16/2014 1239   VLDL 28 03/16/2014 1239   LDLCALC 103* 03/16/2014 1239      ASSESSMENT AND PLAN:  Severe mitral regurgitation 2/2 Bacterial endocarditis S/P minimally-invasive mitral valve repair Progressing well after recent minimally invasive mitral valve repair.    -  Refer to cardiac rehabilitation.    -  Obtain follow-up 2-D echocardiogram.    -  SBE prophylaxis card given.    -  Continue follow-up with infectious disease for antibiotic therapy.    -  Follow-up with Dr. Cornelius Moras as planned.  Essential hypertension Uncontrolled. We discussed the importance of  hypertension control. Increase hydralazine to 50 mg 3 times a day. Continue current dose of clonidine, Lasix, metoprolol. Return for blood pressure check with the nurse in 1-2 weeks. Check follow-up basic metabolic panel today.  Chronic diastolic CHF (congestive heart failure) Volume appears stable. Continue current dose of Lasix. Obtain basic metabolic panel today.  Chronic renal insufficiency, stage III (moderate) Obtain follow-up basic metabolic panel today. Continue follow-up with nephrology.  Diabetes type 2, uncontrolled  Follow-up with primary care for strict control.  Current medicines are reviewed at length with the patient today.  The patient does not have concerns regarding medicines.  The following changes have been made:  As above.   Labs/ tests ordered today include:   Orders Placed This Encounter  Procedures  . Basic Metabolic Panel (BMET)  . EKG 12-Lead  .  2D Echocardiogram without contrast  . PR OFFICE OUTPATIENT VISIT 5 MINUTES    Disposition:   FU with Dr. Delane Ginger 4-6 weeks.    Signed, Brynda Rim, MHS 03/17/2015 11:59 AM    [facility] [address] [place], [place], [place]  [address] Phone: [phone]; Fax: [phone]   ADDENDUM 03/22/2015 11:00 AM: Reviewed with Dr. Cornelius Moras and reviewed chart again.  The patient was seen by Dr. Delane Ginger once during his 2 prolonged hospital stays.  He was seen by Dr. Rachelle Hora Croitoru more and he followed him closer to DC after his surgery.  We felt like it made the most sense for the patient to FU with Dr. Thurmon Fair.  Therefore, he will FU with him instead.  The patient will be notified. Tereso Newcomer, PA-C   03/22/2015 11:00 AM

## 2015-03-17 NOTE — Patient Instructions (Signed)
INCREASE HYDRALAZINE TO 50 MG THREE TIME DAILY; NEW RX SENT IN FOR THE 50 MG TABLET  LAB WORK BMET  Your physician has requested that you have an echocardiogram PER SCOTT WEAVER, PA TO HAVE THIS DONE WITHIN THE NEXT 2 WEEKS. Echocardiography is a painless test that uses sound waves to create images of your heart. It provides your doctor with information about the size and shape of your heart and how well your heart's chambers and valves are working. This procedure takes approximately one hour. There are no restrictions for this procedure.  You have been referred to CARDIAC REHAB AT [facility]  YOU HAVE BEEN GIVEN AN SBE CARD; THIS IS A CARD THAT YOU NEED TO CARRY AT ALL TIMES WITH YOU, MAKE SURE TO SHOW THIS CARD TO ALL OF YOUR DOCTORS AND DENTIST AS WELL.  YOU WILL NEED A BLOOD PRESSURE CHECK WITH THE NURSE TO BE DONE IN 1-2 WEEKS; MAKE SURE TO TAKE YOUR BP MEDS 1-2 HOURS BEFORE APPT.

## 2015-03-17 NOTE — Telephone Encounter (Signed)
ABNL Creatinine = 2.39, last wk 1.62.  Pt on Ampicillin, every 4 hours, stop date 03/31/15.  Will notifiy Dr. Drue SecondSnider.  Dr. Drue SecondSnider ordered 1 liter of normal saline IV over 3 hours in the home.  Change IV ampicillin to every 6 hours and repeat lab work in 2 days.  Page Dr. Drue SecondSnider with the results on Saturday.  [facility] stated that their protocol for IV fluid administration was 250 mL hourly.  Dr. Carlisle BeersSnider Oked 4 hours administration of normal saline.  Larita FifeLynn, [facility] Pharmacist, repeated back these orders.

## 2015-03-18 ENCOUNTER — Telehealth: Payer: Self-pay | Admitting: *Deleted

## 2015-03-18 DIAGNOSIS — I5021 Acute systolic (congestive) heart failure: Secondary | ICD-10-CM

## 2015-03-18 MED ORDER — POTASSIUM CHLORIDE ER 20 MEQ PO TBCR: 20.0000 meq | EXTENDED_RELEASE_TABLET | Freq: Every day | ORAL | Status: DC

## 2015-03-18 MED ORDER — FUROSEMIDE 40 MG PO TABS: 80.0000 mg | ORAL_TABLET | ORAL | Status: DC

## 2015-03-18 NOTE — Telephone Encounter (Signed)
pt notified of lab results. I verified his current K+ dose, med list reflects K+ 40 meq daily; pt states he only takes  20 meq tab daily. I advised to take extra K+ 20 meq today and that I will d/w PA to see if he wants pt to start K+ 40 meq daily. bmet 03/25/15.Marland Kitchen.Pt ok

## 2015-03-22 ENCOUNTER — Telehealth: Payer: Self-pay | Admitting: Physician Assistant

## 2015-03-22 ENCOUNTER — Inpatient Hospital Stay: Payer: Self-pay | Admitting: Internal Medicine

## 2015-03-22 NOTE — Telephone Encounter (Signed)
s/w pt and advised per Bing NeighborsScott W. PA and Dr. Cornelius Moraswen to have pt start seeing Dr. Royann Shiversroitoru instead of Dr. Elease HashimotoNahser. pt agreeable to plan of care and [facility] with Dr. Royann Shiversroitoru.

## 2015-03-22 NOTE — Telephone Encounter (Signed)
Please change FU for Rob HickmanBenjamin Winger from Dr. Delane GingerPhil Nahser to Dr. Thurmon FairMihai Croitoru.  He is scheduled to see Dr. Delane GingerPhil Nahser the end of April.    Can we cancel the appointment with Dr. Elease HashimotoNahser and get him rescheduled with Dr. Rachelle HoraMihai Croitoru around the same timeframe?  I reviewed his chart again.  He saw Dr. Elease HashimotoNahser once during his 2 prolonged hospital stays.  Dr. Rachelle HoraMihai Croitoru saw him several times.  I reviewed it as well with Dr. Cornelius Moraswen.  We thought it made the most sense for him to FU with Dr. Royann Shiversroitoru. Please let him know the reason why.  Thanks, Tereso NewcomerScott Jsiah Menta, PA-C   03/22/2015 10:54 AM

## 2015-03-23 ENCOUNTER — Telehealth: Payer: Self-pay | Admitting: Cardiology

## 2015-03-23 NOTE — Telephone Encounter (Signed)
Called [facility]. They were unaware of an Amy that works there that would have called us. Checked patient's record against care providers and did not find an Amy that way either. Hopefully Amy will call back.

## 2015-03-23 NOTE — Telephone Encounter (Signed)
New Message  Amy from [facility] was following up about orders to be signed by Dr. Anne FuSkains. Please call back and discuss.

## 2015-03-24 ENCOUNTER — Ambulatory Visit (INDEPENDENT_AMBULATORY_CARE_PROVIDER_SITE_OTHER): Payer: Self-pay | Admitting: Interventional Cardiology

## 2015-03-24 ENCOUNTER — Telehealth: Payer: Self-pay | Admitting: *Deleted

## 2015-03-24 ENCOUNTER — Encounter: Payer: Self-pay | Admitting: Physician Assistant

## 2015-03-24 DIAGNOSIS — B958 Unspecified staphylococcus as the cause of diseases classified elsewhere: Secondary | ICD-10-CM

## 2015-03-24 DIAGNOSIS — A4101 Sepsis due to Methicillin susceptible Staphylococcus aureus: Secondary | ICD-10-CM

## 2015-03-24 DIAGNOSIS — I330 Acute and subacute infective endocarditis: Secondary | ICD-10-CM

## 2015-03-24 DIAGNOSIS — Z5181 Encounter for therapeutic drug level monitoring: Secondary | ICD-10-CM

## 2015-03-24 DIAGNOSIS — Z9889 Other specified postprocedural states: Secondary | ICD-10-CM

## 2015-03-24 DIAGNOSIS — I2690 Septic pulmonary embolism without acute cor pulmonale: Secondary | ICD-10-CM

## 2015-03-24 DIAGNOSIS — I340 Nonrheumatic mitral (valve) insufficiency: Secondary | ICD-10-CM

## 2015-03-24 DIAGNOSIS — I76 Septic arterial embolism: Secondary | ICD-10-CM

## 2015-03-24 LAB — POCT INR: INR: 1.8

## 2015-03-24 NOTE — Telephone Encounter (Signed)
pt notified about lab results. pt aware repeat 4/4 bmet. will forward labs to [facility] for INR dosing. Pt aware.

## 2015-03-24 NOTE — Telephone Encounter (Signed)
thx for update.  Donato SchultzSKAINS, MARK, MD

## 2015-03-25 ENCOUNTER — Other Ambulatory Visit: Payer: Self-pay

## 2015-03-29 ENCOUNTER — Encounter: Payer: Self-pay | Admitting: Thoracic Surgery (Cardiothoracic Vascular Surgery)

## 2015-03-29 DIAGNOSIS — Z48812 Encounter for surgical aftercare following surgery on the circulatory system: Secondary | ICD-10-CM | POA: Diagnosis not present

## 2015-03-30 ENCOUNTER — Telehealth: Payer: Self-pay | Admitting: *Deleted

## 2015-03-30 ENCOUNTER — Telehealth: Payer: Self-pay | Admitting: Cardiology

## 2015-03-30 NOTE — Telephone Encounter (Signed)
Called [facility] to inquire about patient's INR results that were due on 03/28/15. Spoke with Louann and she states that her point of care machine malfunctioned and she did not get an INR result.  She states she drew a tube of blood for venipucture but by the time she got to the office the blood was clotted and that she notified her office and they were suppose have someone else to go to the patient's house and get a Point of Care INR.  To this date, no result and asked if she could get one today. She states if not today no later than tomorrow. Instructed her to fax or call INR results to us as soon as done.

## 2015-03-30 NOTE — Telephone Encounter (Signed)
New Message ° ° ° ° ° ° °[facility] calling stating that they are checking on the status of skilled nursing home health forms that were faxed to Dr. Skains for him to sign. Please call back and advise.  ° ° ° °

## 2015-03-31 ENCOUNTER — Other Ambulatory Visit (HOSPITAL_COMMUNITY): Payer: Self-pay

## 2015-03-31 ENCOUNTER — Other Ambulatory Visit: Payer: Self-pay

## 2015-03-31 ENCOUNTER — Encounter: Payer: Self-pay | Admitting: Physician Assistant

## 2015-03-31 ENCOUNTER — Telehealth: Payer: Self-pay | Admitting: *Deleted

## 2015-03-31 ENCOUNTER — Ambulatory Visit (INDEPENDENT_AMBULATORY_CARE_PROVIDER_SITE_OTHER): Payer: Medicaid Other | Admitting: *Deleted

## 2015-03-31 ENCOUNTER — Ambulatory Visit (INDEPENDENT_AMBULATORY_CARE_PROVIDER_SITE_OTHER): Payer: Medicaid Other | Admitting: Physician Assistant

## 2015-03-31 ENCOUNTER — Ambulatory Visit (HOSPITAL_COMMUNITY): Payer: Medicaid Other | Attending: Cardiology | Admitting: Radiology

## 2015-03-31 VITALS — BP 192/110 | HR 64 | Ht 74.0 in | Wt 227.0 lb

## 2015-03-31 DIAGNOSIS — Z9889 Other specified postprocedural states: Secondary | ICD-10-CM | POA: Diagnosis not present

## 2015-03-31 DIAGNOSIS — I10 Essential (primary) hypertension: Secondary | ICD-10-CM

## 2015-03-31 DIAGNOSIS — I951 Orthostatic hypotension: Secondary | ICD-10-CM

## 2015-03-31 DIAGNOSIS — Z5181 Encounter for therapeutic drug level monitoring: Secondary | ICD-10-CM | POA: Diagnosis not present

## 2015-03-31 DIAGNOSIS — I5021 Acute systolic (congestive) heart failure: Secondary | ICD-10-CM

## 2015-03-31 DIAGNOSIS — Z720 Tobacco use: Secondary | ICD-10-CM | POA: Diagnosis not present

## 2015-03-31 DIAGNOSIS — N189 Chronic kidney disease, unspecified: Secondary | ICD-10-CM | POA: Diagnosis not present

## 2015-03-31 DIAGNOSIS — E119 Type 2 diabetes mellitus without complications: Secondary | ICD-10-CM | POA: Diagnosis not present

## 2015-03-31 DIAGNOSIS — N183 Chronic kidney disease, stage 3 unspecified: Secondary | ICD-10-CM

## 2015-03-31 DIAGNOSIS — I5031 Acute diastolic (congestive) heart failure: Secondary | ICD-10-CM

## 2015-03-31 DIAGNOSIS — E876 Hypokalemia: Secondary | ICD-10-CM

## 2015-03-31 DIAGNOSIS — I38 Endocarditis, valve unspecified: Secondary | ICD-10-CM

## 2015-03-31 DIAGNOSIS — H53139 Sudden visual loss, unspecified eye: Secondary | ICD-10-CM | POA: Insufficient documentation

## 2015-03-31 DIAGNOSIS — I340 Nonrheumatic mitral (valve) insufficiency: Secondary | ICD-10-CM

## 2015-03-31 DIAGNOSIS — H53132 Sudden visual loss, left eye: Secondary | ICD-10-CM

## 2015-03-31 LAB — BASIC METABOLIC PANEL
BUN: 13 mg/dL (ref 6–23)
CHLORIDE: 100 meq/L (ref 96–112)
CO2: 29 meq/L (ref 19–32)
Calcium: 8.3 mg/dL — ABNORMAL LOW (ref 8.4–10.5)
Creatinine, Ser: 1.72 mg/dL — ABNORMAL HIGH (ref 0.40–1.50)
GFR: 58.51 mL/min — ABNORMAL LOW (ref >60.00)
GLUCOSE: 377 mg/dL — AB (ref 70–99)
Potassium: 2.9 mEq/L — ABNORMAL LOW (ref 3.5–5.1)
Sodium: 136 mEq/L (ref 135–145)

## 2015-03-31 LAB — POCT INR: INR: 2.1

## 2015-03-31 MED ORDER — FUROSEMIDE 40 MG PO TABS
40.0000 mg | ORAL_TABLET | Freq: Two times a day (BID) | ORAL | Status: DC
Start: 2015-03-31 — End: 2015-04-26

## 2015-03-31 NOTE — Patient Instructions (Signed)
STOP LASIX FOR 2 DAYS THEN :  START TAKING LASIX 40 MG IN THE AM TAKE 40MG  IN TH PM     FOLLOW UP WITH [facility] IN 3 TO 4 WEEKS OR NEXT AVAILABLE

## 2015-03-31 NOTE — Assessment & Plan Note (Signed)
Status post recent mitral valve repair. Echocardiogram completed today. No murmur on exam.

## 2015-03-31 NOTE — Telephone Encounter (Signed)
Orders on Dr Anne FuSkains' cart to be signed.  He will be in the office on Friday and they will be faxed once signed.

## 2015-03-31 NOTE — Assessment & Plan Note (Signed)
I'm checking a basic metabolic panel today. The one 2 weeks ago revealed an increase in creatinine up to 2.07 from 1.44.

## 2015-03-31 NOTE — Progress Notes (Signed)
Patient ID: Dwayne HickmanBenjamin [name], male   DOB: 02/17/1980, 35 y.o.   MRN: [id number]    Date:  03/31/2015   ID:  Dwayne HickmanBenjamin [name], DOB 08/26/1980, MRN [id number]  PCP:  Jeanann LewandowskyJEGEDE, OLUGBEMIGA, MD  Primary Cardiologist:  Croitoru   Chief Complaint  Patient presents with  . Dizziness  . hypertension     History of Present Illness: Dwayne [name] is a 35 y.o. male Dwayne [name] is a 35 y.o. male with a hx of DM2, HTN, chronic diabetic ulcers. He was admitted to the hospital in January (1/10-1/28) with generalized weakness and fatigue, fevers, chills and diarrhea in the setting of mitral valve endocarditis (MSSA bacteremia). He had perforation of the posterior leaflet resulting in severe mitral regurgitation. Hospitalization was complicated by diastolic heart failure, AKI on CKD, pericarditis with small fibrous pericardial effusion, septic arthritis of the right shoulder, osteomyelitis of T3 and T4 and myositis in the L4 and L5 area. There was evidence of septic emboli to the brain possible emboli to the kidney. He was evaluated by multiple specialists. He had dental extraction with oral surgery. Nephrology followed his chronic kidney disease. He was diagnosed with PAD (Suspected embolization to left leg; long history of LLE claudication-left ABI 0.64). He was followed by vascular surgery and outpatient follow-up was planned. Orthopedics helped with management of his shoulder infection. He required IV antibiotics for 6 weeks prior to proceeding with mitral valve surgery. Of note, cardiac catheterization demonstrated normal coronary arteries.   He was readmitted 2/3-2/26. He presented with volume overload. He underwent arthroscopic shoulder surgery due to his septic right shoulder with extensive debridement of the glenohumeral joint and subacromial decompression. He ultimately underwent Minimally invasive mitral valve repair on 02/18/15 with Dr. Cornelius Moraswen. He was started on Coumadin postoperatively. He  remained in NSR. He will require 6 weeks of ampicillin as an outpatient.   She was seen 2 weeks ago at which time his blood pressure was severely elevated. His hydralazine was increased. He did not have any particular complaints at that time and no signs of acute heart failure. He presented today for echocardiogram and blood pressure check. The nurse reported he seemed lethargic blood pressure was still very elevated. Patient was then seen in clinic. Her orthostatic vital signs were checked and revealed severe orthostasis. See below. Patient reports feeling sluggish particular with position change. He also reports some dizziness. He also states approximate one and half weeks ago he developed acute visual change/loss in his left eye reports it seems like "something is over his eye" along with blurry vision.  He'll has some lower extremity edema but otherwise denies nausea, vomiting, fever, chest pain, shortness of breath, orthopnea, PND, cough, congestion, abdominal pain, hematochezia, melena, claudication.  Wt Readings from Last 3 Encounters:  03/31/15 227 lb (102.967 kg)  03/17/15 219 lb (99.338 kg)  03/15/15 226 lb (102.513 kg)     Past Medical History  Diagnosis Date  . Hypertension   . Acute renal failure   . Bacteremia     MSSA   . Diabetes type 2, uncontrolled 01/09/2015  . 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 Essential hypertension, benign 01/18/2014  . Infective myositis of left thigh   . Noncompliance 01/18/2014  . Septic shock   . Staphylococcus aureus bacteremia with sepsis   . Tobacco abuse 01/15/2014  . Mitral regurgitation 01/13/2015    Severe by TEE  . MSSA (methicillin susceptible Staphylococcus aureus)   . Type II diabetes mellitus     not  controlled  . Diabetic foot infection 01/15/2014  . S/P minimally-invasive mitral valve repair 02/18/2015    Complex valvuloplasty including bovine pericardial patch repair of perforation of posterior leaflet with 28 mm Sorin Memo 3D  ring annuloplasty via right mini thoracotomy approach     Current Outpatient Prescriptions  Medication Sig Dispense Refill  . aspirin EC 325 MG EC tablet Take 1 tablet (325 mg total) by mouth daily. 30 tablet 0  . cloNIDine (CATAPRES) 0.3 MG tablet Take 1 tablet (0.3 mg total) by mouth 3 (three) times daily. 90 tablet 3  . furosemide (LASIX) 40 MG tablet Take 1 tablet (40 mg total) by mouth 2 (two) times daily. 60 tablet 5  . hydrALAZINE (APRESOLINE) 50 MG tablet Take 1 tablet (50 mg total) by mouth 3 (three) times daily. 90 tablet 11  . insulin aspart protamine- aspart (NOVOLOG MIX 70/30) (70-30) 100 UNIT/ML injection Inject 0.14 mLs (14 Units total) into the skin 2 (two) times daily with a meal. 10 mL 11  . Insulin Syringe-Needle U-100 28G X 1/2" 0.5 ML MISC Use as directed. Any generic available. 100 each 12  . Lancets (FREESTYLE) lancets Any generic available. Use as instructed 100 each 12  . metoprolol tartrate (LOPRESSOR) 25 MG tablet Take 3 tablets (75 mg total) by mouth 2 (two) times daily. 180 tablet 3  . oxyCODONE (OXY IR/ROXICODONE) 5 MG immediate release tablet Take 1 tablet (5 mg total) by mouth every 4 (four) hours as needed for moderate pain or severe pain. 30 tablet 0  . Potassium Chloride ER 20 MEQ TBCR Take 20 mEq by mouth daily.    Marland Kitchen warfarin (COUMADIN) 5 MG tablet Take 1 tablet (5 mg total) by mouth daily at 6 PM. 30 tablet 1   No current facility-administered medications for this visit.    Allergies:   No Known Allergies  Social History:  The patient  reports that he quit smoking about 2 months ago. His smoking use included Cigars. He has never used smokeless tobacco. He reports that he drinks alcohol. He reports that he does not use illicit drugs.   Family history:   Family History  Problem Relation Age of Onset  . Heart attack Neg Hx   . Stroke Maternal Uncle   . Hypertension Mother   . Diabetes Brother     ROS:  Please see the history of present illness.  All  other systems reviewed and negative.   PHYSICAL EXAM: VS:  BP 192/110 mmHg  Pulse 64  Ht  (1.88 m)  Wt 227 lb (102.967 kg)  BMI 29.13 kg/m2 Well nourished, well developed, in no acute distress HEENT: Pupils are equal round react to light accommodation extraocular movements are intact.  Neck: no JVDNo cervical lymphadenopathy. Cardiac: Regular rate and rhythm without murmurs rubs or gallops. Lungs:  clear to auscultation bilaterally, no wheezing, rhonchi or rales Abd: soft, nontender, positive bowel sounds all quadrants, no hepatosplenomegaly Ext: 1+ lower extremity edema.  2+ radial and dorsalis pedis pulses. Skin: warm and dry Neuro:  Grossly normal.  Decreased visual acuity in the left eye.  Blurry/double vision. Right appears intact with preserved peripheral vision.  CN II, V, VII-XII.   Finger thumb, heel to shin ok  EKG:  Normal sinus rhythm. Septal Q waves    ASSESSMENT AND PLAN:  Problem List Items Addressed This Visit    Severe mitral regurgitation    Status post recent mitral valve repair. Echocardiogram completed today. No murmur on exam.  Relevant Medications   furosemide (LASIX) tablet   Orthostatic hypotension    The patient has severe orthostatic hypotension. Supine blood pressure was 192/110 with a heart rate of 67. Sitting up was 159/98 with a heart rate of 63 and standing was 119/74 with heart rate of 67. 3 minutes after standing it was 123/81 heart rate of 64. He reported feeling sluggish upon standing.  Hold Lasix for the next 2 days. Restart at a decreased dose of 40 in the morning and 40 at night.  I asked him to wear his compression socks during the day.  I've asked him to go to the emergency room for his acute vision change that occurred 1.5 weeks ago.   IV fluids would be appreciated.      Relevant Medications   furosemide (LASIX) tablet   HTN (hypertension)    On his severe orthostatic hypotension, I'm not going to increase any medications at this  time.      Relevant Medications   furosemide (LASIX) tablet   Endocarditis    He continues to get IV antibiotics.      Relevant Medications   furosemide (LASIX) tablet   Congestive heart failure   Relevant Medications   furosemide (LASIX) tablet   Chronic renal insufficiency, stage III (moderate)    I'm checking a basic metabolic panel today. The one 2 weeks ago revealed an increase in creatinine up to 2.07 from 1.44.      Acute loss of vision    Patient reports this acute vision loss occurred proximal to 1.5 weeks ago. He said he has blurry blurry vision and feels like "something is for his eye".  Commended he go to the emergency room for evaluation. Given his history of endocarditis is concern for embolus.      Acute diastolic CHF (congestive heart failure), NYHA class 3    He does have some lower extremity edema for which I've asked him to wear his compression socks however, he is likely intravascularly dry given his severe orthostasis. Vascular and not take any Lasix for the next couple days and then decrease the dose.      Relevant Medications   furosemide (LASIX) tablet    Other Visit Diagnoses    CKD (chronic kidney disease), unspecified stage    -  Primary    Relevant Orders    EKG 12-Lead    Basic Metabolic Panel (BMET)

## 2015-03-31 NOTE — Assessment & Plan Note (Addendum)
The patient has severe orthostatic hypotension. Supine blood pressure was 192/110 with a heart rate of 67. Sitting up was 159/98 with a heart rate of 63 and standing was 119/74 with heart rate of 67. 3 minutes after standing it was 123/81 heart rate of 64. He reported feeling sluggish upon standing.  Hold Lasix for the next 2 days. Restart at a decreased dose of 40 in the morning and 40 at night.  I asked him to wear his compression socks during the day.  I've asked him to go to the emergency room for his acute vision change that occurred 1.5 weeks ago.   IV fluids would be appreciated.

## 2015-03-31 NOTE — Assessment & Plan Note (Signed)
On his severe orthostatic hypotension, I'm not going to increase any medications at this time.

## 2015-03-31 NOTE — Assessment & Plan Note (Signed)
He continues to get IV antibiotics.

## 2015-03-31 NOTE — Progress Notes (Signed)
Echocardiogram performed.  

## 2015-03-31 NOTE — Assessment & Plan Note (Signed)
Patient reports this acute vision loss occurred proximal to 1.5 weeks ago. He said he has blurry blurry vision and feels like "something is for his eye".  Commended he go to the emergency room for evaluation. Given his history of endocarditis is concern for embolus.

## 2015-03-31 NOTE — Telephone Encounter (Signed)
Patient came for scheduled nurse visit for BP check and echo today. After echo, per echo tech, patient became dizzy upon walking from echo to nurse room. He was returned to echo room and given crackers, juice, pepsi. His BP at that time was 185/116, HR 66.  Upon my seeing pt at echo, his BP at that time was 137/89, HR 64. He is no longer dizzy.  Reports he gets dizzy several times daily when he gets up to walk. He is not accompanied by anyone, drove himself to office today. Ambulated accompanied by 2 nurses to exam room. Discussed with Dr. Shirlee LatchMcLean (DOD), who instructed to add to FLEX schedule to be seen today. Pt added to [employer] Companies[employer] Hager (flex) schedule.   Returned to room, informed patient he will be seeing PA today.   Ambulated to wait in chairs near scale until room ready. He was not dizzy with this ambulation. Flex assist on way to get patient as room was opening up at that time.

## 2015-03-31 NOTE — Assessment & Plan Note (Signed)
He does have some lower extremity edema for which I've asked him to wear his compression socks however, he is likely intravascularly dry given his severe orthostasis. Vascular and not take any Lasix for the next couple days and then decrease the dose.

## 2015-03-31 NOTE — Telephone Encounter (Signed)
LouAnn from [facility] left a message on our voicemail that stated she set up an appointment to have patient's INR done this morning at 9:30am and the patient called her at 9:10am stating that he had another appointment at 10am this morning and would not be able to have his INR checked by her.  In her message she states she will try to see if she can see him this afternoon if patient will be at home. I called the patient and left a message to advise patient that he is overdue for an INR check and that we will need INR results as soon as possible to monitor and dose Coumadin.

## 2015-04-01 ENCOUNTER — Other Ambulatory Visit: Payer: Self-pay

## 2015-04-01 ENCOUNTER — Telehealth: Payer: Self-pay | Admitting: *Deleted

## 2015-04-01 DIAGNOSIS — Z8619 Personal history of other infectious and parasitic diseases: Secondary | ICD-10-CM | POA: Insufficient documentation

## 2015-04-01 DIAGNOSIS — Z7982 Long term (current) use of aspirin: Secondary | ICD-10-CM | POA: Diagnosis not present

## 2015-04-01 DIAGNOSIS — Z87891 Personal history of nicotine dependence: Secondary | ICD-10-CM | POA: Insufficient documentation

## 2015-04-01 DIAGNOSIS — Z794 Long term (current) use of insulin: Secondary | ICD-10-CM | POA: Diagnosis not present

## 2015-04-01 DIAGNOSIS — Z7901 Long term (current) use of anticoagulants: Secondary | ICD-10-CM | POA: Diagnosis not present

## 2015-04-01 DIAGNOSIS — E119 Type 2 diabetes mellitus without complications: Secondary | ICD-10-CM | POA: Insufficient documentation

## 2015-04-01 DIAGNOSIS — Z87448 Personal history of other diseases of urinary system: Secondary | ICD-10-CM | POA: Insufficient documentation

## 2015-04-01 DIAGNOSIS — H5461 Unqualified visual loss, right eye, normal vision left eye: Secondary | ICD-10-CM | POA: Diagnosis not present

## 2015-04-01 DIAGNOSIS — I10 Essential (primary) hypertension: Secondary | ICD-10-CM | POA: Diagnosis not present

## 2015-04-01 DIAGNOSIS — H538 Other visual disturbances: Secondary | ICD-10-CM | POA: Diagnosis present

## 2015-04-01 NOTE — Telephone Encounter (Signed)
lmptcb to go over echo results  

## 2015-04-01 NOTE — Telephone Encounter (Signed)
lmptcb x 2 to go over echo results

## 2015-04-01 NOTE — Telephone Encounter (Signed)
Left message for Dwayne [name] that orders have been signed and taken to MR to be faxed.  Requested she call back if any other questions or concerns

## 2015-04-02 ENCOUNTER — Emergency Department (HOSPITAL_COMMUNITY)
Admission: EM | Admit: 2015-04-02 | Discharge: 2015-04-02 | Disposition: A | Discharge status: Home / Self Care | Payer: Medicaid Other | Attending: Emergency Medicine | Admitting: Emergency Medicine

## 2015-04-02 ENCOUNTER — Encounter (HOSPITAL_COMMUNITY): Payer: Self-pay | Admitting: *Deleted

## 2015-04-02 DIAGNOSIS — H5462 Unqualified visual loss, left eye, normal vision right eye: Secondary | ICD-10-CM

## 2015-04-02 MED ORDER — FLUORESCEIN SODIUM 1 MG OP STRP
1.0000 | ORAL_STRIP | Freq: Once | OPHTHALMIC | Status: AC
  Administered 2015-04-02: 1 via OPHTHALMIC
  Filled 2015-04-02: qty 1

## 2015-04-02 MED ORDER — TETRACAINE HCL 0.5 % OP SOLN
1.0000 [drp] | Freq: Once | OPHTHALMIC | Status: AC
  Administered 2015-04-02: 1 [drp] via OPHTHALMIC
  Filled 2015-04-02: qty 2

## 2015-04-02 NOTE — ED Notes (Signed)
The pt is c/o a change in vision 1 1/2 weeks ago.  He thought it would get better.  He is very sleepy

## 2015-04-02 NOTE — Discharge Instructions (Signed)
Blurred Vision °You have been seen today complaining of blurred vision. This means you have a loss of ability to see small details.  °CAUSES  °Blurred vision can be a symptom of underlying eye problems, such as: °· Aging of the eye (presbyopia). °· Glaucoma. °· Cataracts. °· Eye infection. °· Eye-related migraine. °· Diabetes mellitus. °· Fatigue. °· Migraine headaches. °· High blood pressure. °· Breakdown of the back of the eye (macular degeneration). °· Problems caused by some medications. °The most common cause of blurred vision is the need for eyeglasses or a new prescription. Today in the emergency department, no cause for your blurred vision can be found. °SYMPTOMS  °Blurred vision is the loss of visual sharpness and detail (acuity). °DIAGNOSIS  °Should blurred vision continue, you should see your caregiver. If your caregiver is your primary care physician, he or she may choose to refer you to another specialist.  °TREATMENT  °Do not ignore your blurred vision. Make sure to have it checked out to see if further treatment or referral is necessary. °SEEK MEDICAL CARE IF:  °You are unable to get into a specialist so we can help you with a referral. °SEEK IMMEDIATE MEDICAL CARE IF: °You have severe eye pain, severe headache, or sudden loss of vision. °MAKE SURE YOU:  °· Understand these instructions. °· Will watch your condition. °· Will get help right away if you are not doing well or get worse. °Document Released: 12/20/2003 Document Revised: 03/10/2012 Document Reviewed: 07/21/2008 °Exit[employer]® Patient Information ©2015 [employer]. This information is not intended to replace advice given to you by your health care provider. Make sure you discuss any questions you have with your health care provider. ° °

## 2015-04-02 NOTE — ED Provider Notes (Signed)
This chart was scribed for [name], DO by Bronson CurbJacqueline Melvin, ED Scribe. This patient was seen in room A10C/A10C and the patient's care was started at 3:56 AM.  TIME SEEN: 0356  CHIEF COMPLAINT: Visual Field Changes  HPI:   HPI Comments: Dwayne [name] is a 35 y.o. male, with history of HTN, acute renal failure, and DM, who presents to the Emergency Department complaining of decreased vision of the left eye that began 2 weeks ago. Patient states he waited this long to be seen because he was recently hospitalized and did not want to come back to the ED to be evaluated. Patient states his vision is blurry in the left eye, but denies total vision loss. Patient reports he can sometimes see "something dangling" in his left that "moves around". He currently wears glasses and does not wear contacts. He denies flashes of light, spots, loss of vision coming down like curtain shade, eye pain, or headache. No fever, eye trauma, head trauma, vomiting.  Patient is not established with an ophthalmologist and states his last visit to an eye doctor was approximately 5 year ago.  No h/o glaucoma.   ROS: See HPI Constitutional: no fever  Eyes: no drainage  ENT: no runny nose   Cardiovascular:  no chest pain  Resp: no SOB  GI: no vomiting GU: no dysuria Integumentary: no rash  Allergy: no hives  Musculoskeletal: no leg swelling  Neurological: no slurred speech ROS otherwise negative  PAST MEDICAL HISTORY/PAST SURGICAL HISTORY:  Past Medical History  Diagnosis Date  . Hypertension   . Acute renal failure   . Bacteremia     MSSA   . Diabetes type 2, uncontrolled 01/09/2015  . 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 Essential hypertension, benign 01/18/2014  . Infective myositis of left thigh   . Noncompliance 01/18/2014  . Septic shock   . Staphylococcus aureus bacteremia with sepsis   . Tobacco abuse 01/15/2014  . Mitral regurgitation 01/13/2015    Severe by TEE  . MSSA (methicillin  susceptible Staphylococcus aureus)   . Type II diabetes mellitus     not controlled  . Diabetic foot infection 01/15/2014  . 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 Authorizing Provider  aspirin EC 325 MG EC tablet Take 1 tablet (325 mg total) by mouth daily. 02/25/15   Donielle Margaretann LovelessM Zimmerman, PA-C  cloNIDine (CATAPRES) 0.3 MG tablet Take 1 tablet (0.3 mg total) by mouth 3 (three) times daily. 03/02/15   Erin R Barrett, PA-C  furosemide (LASIX) 40 MG tablet Take 1 tablet (40 mg total) by mouth 2 (two) times daily. 03/31/15   Dwana MelenaBryan W Hager, PA-C  hydrALAZINE (APRESOLINE) 50 MG tablet Take 1 tablet (50 mg total) by mouth 3 (three) times daily. 03/17/15   Beatrice LecherScott T Weaver, PA-C  insulin aspart protamine- aspart (NOVOLOG MIX 70/30) (70-30) 100 UNIT/ML injection Inject 0.14 mLs (14 Units total) into the skin 2 (two) times daily with a meal. 01/28/15   [name], DO  Insulin Syringe-Needle U-100 28G X 1/2" 0.5 ML MISC Use as directed. Any generic available. 01/28/15   [name], DO  Lancets (FREESTYLE) lancets Any generic available. Use as instructed 01/28/15   Edsel PetrinMaryann Mikhail, DO  metoprolol tartrate (LOPRESSOR) 25 MG tablet Take 3 tablets (75 mg total) by mouth 2 (two) times daily. 03/02/15   Erin R Barrett, PA-C  oxyCODONE (OXY IR/ROXICODONE) 5 MG immediate release tablet Take 1 tablet (5 mg total) by mouth every 4 (four) hours as needed for moderate pain or severe pain. 02/25/15   Donielle Margaretann Loveless, PA-C  potassium chloride SA (K-DUR,KLOR-CON) 20 MEQ tablet Take 2 tablets daily 03/31/15   Dwana Melena, PA-C  warfarin (COUMADIN) 5 MG tablet Take 1 tablet (5 mg total) by mouth daily at 6 PM. 02/25/15   Donielle Margaretann Loveless, PA-C    ALLERGIES:  No Known Allergies  SOCIAL HISTORY:  History  Substance Use Topics  . Smoking status: Former Smoker -- 12 years    Types: Cigars    Quit date: 01/09/2015  . Smokeless tobacco: Never Used  . Alcohol Use: Yes     Comment: 02/02/2015 "I'll have 2-3 mixed drinks 2-3 times/yr"    FAMILY HISTORY: Family History  Problem Relation Age of Onset  . Heart attack Neg Hx   . Stroke Maternal Uncle   . Hypertension Mother   . Diabetes Brother     EXAM:  Triage Vitals: BP 191/98 mmHg  Pulse 58  Temp(Src) 97.5 F (36.4 C)  Resp 15  SpO2 100%  CONSTITUTIONAL: Alert and oriented and responds appropriately to questions. Well-appearing; well-nourished HEAD: Normocephalic EYES: Conjunctivae clear, PERRL. EOMI. Reports blurred vision in the left eye. Decreased visual field in the medial upper field of the left eye. No hyphema. No subconjunctival hemorrhage. Fundoscopic exam limited as patient's eyes are not dilated.  PT reports he sees colors and lights with left eye.  Right vision normal for his baseline with glasses on.  Pt's pressure in L eye is 35 - 44 mm Hg but exam very limited as pt continues to squint eye.  Unable to test pressure in R eye as pt is uncooperative.  No pain with consensual light response.  No corneal abrasion or ulceration after fluorescein staining.   ENT: normal nose; no rhinorrhea; moist mucous membranes; pharynx without lesions noted NECK: Supple, no meningismus, no LAD  CARD: RRR; S1 and S2 appreciated; no murmurs, no clicks, no rubs, no gallops RESP: Normal chest excursion without splinting or tachypnea; breath sounds clear and equal bilaterally; no wheezes, no rhonchi, no rales ABD/GI: Normal bowel sounds; non-distended; soft, non-tender, no rebound, no guarding BACK:  The back appears normal and is non-tender to palpation, there is no CVA tenderness EXT: Normal ROM in all joints; non-tender to palpation; no edema; normal capillary  refill; no cyanosis    SKIN: Normal color for age and race; warm NEURO: Moves all extremities equally; sensation to light touch intact diffusely, CN 2-12 intact, normal gait PSYCH: The patient's mood and manner are appropriate. Grooming and personal hygiene are appropriate.  MEDICAL DECISION MAKING: Pt here with 2 weeks vision loss.  Exam very limited 2/2 patient's poor cooperation with exam.  Pressure slightly elevated but likely bc pt squinting eye closed entire time despite being anesthetized and reassured.  6:10 AM  Spoke with Dr. Clarisa Kindred on call for ophthalmology who agrees that given sx present 2 weeks that they are likely irreversible and will see pt in office Monday morning.  Not concerned for acute angle closure glaucoma given only mildly increased pressure with no HA, eye pain, V.  Provided her office and cell number to patient and will have call 8am Monday.  Discussed return  precautions.  He verbalized understanding and is comfortable with plan.   Pt's BP mildly elevated.  States he needs to take his BP meds this AM and would like to do it at home.  Sx were present with BP 132/75 in ED.  No neuro deficits on exam.  Doubt central cause.     I personally performed the services described in this documentation, which was scribed in my presence. The recorded information has been reviewed and is accurate.    Layla Maw Ward, DO 04/02/15 1734

## 2015-04-02 NOTE — ED Notes (Signed)
Dr. Elesa MassedWard informed of patients BP and she states she is okay with still discharging the patient. Pt A&Ox4, ambulatory at d/c with steady gait, NAD

## 2015-04-04 ENCOUNTER — Other Ambulatory Visit: Payer: Self-pay

## 2015-04-04 NOTE — Telephone Encounter (Signed)
pt notified of echo results with verbal understanding to results given.

## 2015-04-06 ENCOUNTER — Encounter: Payer: Self-pay | Admitting: Family Medicine

## 2015-04-06 ENCOUNTER — Ambulatory Visit: Payer: Medicaid Other | Attending: Family Medicine | Admitting: Family Medicine

## 2015-04-06 ENCOUNTER — Ambulatory Visit (INDEPENDENT_AMBULATORY_CARE_PROVIDER_SITE_OTHER): Payer: Self-pay | Admitting: *Deleted

## 2015-04-06 VITALS — BP 145/92 | HR 82 | Temp 97.1°F | Resp 16 | Ht 74.0 in

## 2015-04-06 VITALS — BP 156/99 | HR 81 | Temp 98.2°F | Resp 18 | Ht 74.0 in | Wt 216.2 lb

## 2015-04-06 DIAGNOSIS — E11621 Type 2 diabetes mellitus with foot ulcer: Secondary | ICD-10-CM | POA: Diagnosis not present

## 2015-04-06 DIAGNOSIS — I10 Essential (primary) hypertension: Secondary | ICD-10-CM | POA: Insufficient documentation

## 2015-04-06 DIAGNOSIS — R197 Diarrhea, unspecified: Secondary | ICD-10-CM | POA: Diagnosis not present

## 2015-04-06 DIAGNOSIS — Z7901 Long term (current) use of anticoagulants: Secondary | ICD-10-CM | POA: Diagnosis not present

## 2015-04-06 DIAGNOSIS — Z8679 Personal history of other diseases of the circulatory system: Secondary | ICD-10-CM | POA: Diagnosis not present

## 2015-04-06 DIAGNOSIS — E1165 Type 2 diabetes mellitus with hyperglycemia: Secondary | ICD-10-CM | POA: Insufficient documentation

## 2015-04-06 DIAGNOSIS — Z794 Long term (current) use of insulin: Secondary | ICD-10-CM | POA: Insufficient documentation

## 2015-04-06 DIAGNOSIS — Z7982 Long term (current) use of aspirin: Secondary | ICD-10-CM | POA: Insufficient documentation

## 2015-04-06 DIAGNOSIS — Z9889 Other specified postprocedural states: Secondary | ICD-10-CM

## 2015-04-06 DIAGNOSIS — B958 Unspecified staphylococcus as the cause of diseases classified elsewhere: Secondary | ICD-10-CM

## 2015-04-06 DIAGNOSIS — Z87891 Personal history of nicotine dependence: Secondary | ICD-10-CM | POA: Diagnosis not present

## 2015-04-06 DIAGNOSIS — E119 Type 2 diabetes mellitus without complications: Secondary | ICD-10-CM | POA: Diagnosis not present

## 2015-04-06 DIAGNOSIS — Z953 Presence of xenogenic heart valve: Secondary | ICD-10-CM | POA: Insufficient documentation

## 2015-04-06 DIAGNOSIS — H538 Other visual disturbances: Secondary | ICD-10-CM | POA: Insufficient documentation

## 2015-04-06 DIAGNOSIS — Z452 Encounter for adjustment and management of vascular access device: Secondary | ICD-10-CM

## 2015-04-06 DIAGNOSIS — I330 Acute and subacute infective endocarditis: Secondary | ICD-10-CM

## 2015-04-06 LAB — COMPREHENSIVE METABOLIC PANEL
ALT: 10 U/L (ref 0–53)
AST: 10 U/L (ref 0–37)
Albumin: 3.1 g/dL — ABNORMAL LOW (ref 3.5–5.2)
Alkaline Phosphatase: 110 U/L (ref 39–117)
BUN: 16 mg/dL (ref 6–23)
CALCIUM: 8.6 mg/dL (ref 8.4–10.5)
CHLORIDE: 107 meq/L (ref 96–112)
CO2: 23 meq/L (ref 19–32)
CREATININE: 1.7 mg/dL — AB (ref 0.50–1.35)
GLUCOSE: 169 mg/dL — AB (ref 70–99)
Potassium: 3.4 mEq/L — ABNORMAL LOW (ref 3.5–5.3)
Sodium: 141 mEq/L (ref 135–145)
Total Bilirubin: 0.2 mg/dL (ref 0.2–1.2)
Total Protein: 6.3 g/dL (ref 6.0–8.3)

## 2015-04-06 LAB — GLUCOSE, POCT (MANUAL RESULT ENTRY): POC Glucose: 165 mg/dl — AB (ref 70–99)

## 2015-04-06 MED ORDER — METRONIDAZOLE 500 MG PO TABS: 500.0000 mg | ORAL_TABLET | Freq: Three times a day (TID) | ORAL | Status: DC

## 2015-04-06 NOTE — Progress Notes (Signed)
[name] returns to the office PIC line removal.  I had requested it to be done by his home health nurse but she declined due to the type it was.  The line was easily removed with cuff and end intact.  He complains of  Diarrhea and left sided abdominal pain for over two days.  He had to use the bathroom x 2 while in our office.  [facility] and he can be seen today at 2pm.  [facility] Pressure dressing applied.

## 2015-04-06 NOTE — Progress Notes (Signed)
Patient was in hospital for staph infection. Patient has been off IV antibiotics since March 31. Patient reports diarrhea for 3-4 days. Patient reports stool as watery, orange/brown/yellow, having more than 5 episodes a day. Patient reports feeling weak and reports "When I burp it tastes like boiled eggs in my throat". No nausea/vomiting. Patient reports no pain at this time.  Patient reports blurry vision for 1 week in left eye.

## 2015-04-06 NOTE — Progress Notes (Signed)
Subjective:    Patient ID: Dwayne [name], male    DOB: [birth year], 35 y.o.   MRN: [id number]  HPI   Dwayne [name] presents for an acute visit today and complains of brownish diarrhea x3-4 days and frequency is about 5x/day, denies abdominal pain or cramping; denies vomiting ,has not lost  His appetite; denies the presence of blood in stools.Burping a lot and it tastes like rotten eggs. He recently completed a 6 week course of IV antibiotics and PICC line was removed yesterday.  Complains of L eye blurry vision for the last 3 weeks, was seen at the ED for same last week and states it is worsening. ED Physician had spoken with Dr Clarisa Kindred (Ophthalmology on call) and patient was supposed to be seen two days ago however when he called he got an appointment for 04/18/15. He is concerned that he is loosing his vision gradually as symptoms are worsening. Random CBG today is 165, Hba1c from 01/2015 was 7.0  Of note the Cardiology PA's note from 03/31/15 indicated there was a concern for embolus given this patient had a history of mitral valve endocarditis in 12/2014 and was currently having visual symptoms.(refre to progress notes form 03/31/15 for additional history)    Past Medical History  Diagnosis Date  . Hypertension   . Acute renal failure   . Bacteremia     MSSA   . Diabetes type 2, uncontrolled 01/09/2015  . 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 Essential hypertension, benign 01/18/2014  . Infective myositis of left thigh   . Noncompliance 01/18/2014  . Septic shock   . Staphylococcus aureus bacteremia with sepsis   . Tobacco abuse 01/15/2014  . Mitral regurgitation 01/13/2015    Severe by TEE  . MSSA (methicillin susceptible Staphylococcus aureus)   . Type II diabetes mellitus     not controlled  . Diabetic foot infection 01/15/2014  . 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 I&d extremity Bilateral 01/16/2014    Procedure:  DEBRIDEMENT of bilateral foot ulcers;  Surgeon: Kathryne Hitch, MD;  Location: [facility] OR;  Service: Orthopedics;  Laterality: Bilateral;  . Tee without cardioversion N/A 01/13/2015    Procedure: TRANSESOPHAGEAL ECHOCARDIOGRAM (TEE);  Surgeon: Thurmon Fair, MD;  Location: [facility] ENDOSCOPY;  Service: Cardiovascular;  Laterality: N/A;  . Left and right heart catheterization with coronary angiogram N/A 01/24/2015    Procedure: LEFT AND RIGHT HEART CATHETERIZATION WITH CORONARY ANGIOGRAM;  Surgeon: Peter M Swaziland, MD;  Location: [facility] CATH LAB;  Service: Cardiovascular;  Laterality: N/A;  . Multiple extractions with alveoloplasty N/A 01/25/2015    Procedure: Extraction of tooth #'s 1,2,3,4,5,6,8,9,11,12,13,14,15,16,17,18,19 28,31,32 with alveoloplasty and gross debridement of remaining teeth.;  Surgeon: Charlynne Pander, DDS;  Location: [facility] OR;  Service: Oral Surgery;  Laterality: N/A;  . Cardiac catheterization  12/2014  . Shoulder arthroscopy Right 02/05/2015    Procedure: ARTHROSCOPY SHOULDER;  Surgeon: Nadara Mustard, MD;  Location: [facility] OR;  Service: Orthopedics;  Laterality: Right;  . Mitral valve replacement Right 02/18/2015    Procedure: MINIMALLY INVASIVE MITRAL VALVE (MV) REPAIR;  Surgeon: Purcell Nails, MD;  Location: [facility] OR;  Service: Open Heart Surgery;  Laterality: Right;  . Tee without cardioversion N/A  02/18/2015    Procedure: TRANSESOPHAGEAL ECHOCARDIOGRAM (TEE);  Surgeon: Purcell Nailslarence H Owen, MD;  [facility];  Service: Open Heart Surgery;  Laterality: N/A;     History   Social History  . Marital Status: Married    Spouse Name: N/A  . Number of Children: N/A  . Years of Education: N/A   Occupational History  . Not on file.   Social History Main Topics  . Smoking status: Former Smoker  -- 12 years    Types: Cigars    Quit date: 01/09/2015  . Smokeless tobacco: Never Used  . Alcohol Use: No  . Drug Use: No  . Sexual Activity: Yes     Comment: 02/02/2015 "I smoked black and milds; ~ 5/day"   Other Topics Concern  . Not on file   Social History Narrative    No Known Allergies   Current Outpatient Prescriptions on File Prior to Visit  Medication Sig Dispense Refill  . aspirin EC 325 MG EC tablet Take 1 tablet (325 mg total) by mouth daily. 30 tablet 0  . cloNIDine (CATAPRES) 0.3 MG tablet Take 1 tablet (0.3 mg total) by mouth 3 (three) times daily. 90 tablet 3  . furosemide (LASIX) 40 MG tablet Take 1 tablet (40 mg total) by mouth 2 (two) times daily. 60 tablet 5  . hydrALAZINE (APRESOLINE) 50 MG tablet Take 1 tablet (50 mg total) by mouth 3 (three) times daily. 90 tablet 11  . insulin aspart protamine- aspart (NOVOLOG MIX 70/30) (70-30) 100 UNIT/ML injection Inject 0.14 mLs (14 Units total) into the skin 2 (two) times daily with a meal. 10 mL 11  . Insulin Syringe-Needle U-100 28G X 1/2" 0.5 ML MISC Use as directed. Any generic available. 100 each 12  . Lancets (FREESTYLE) lancets Any generic available. Use as instructed 100 each 12  . metoprolol tartrate (LOPRESSOR) 25 MG tablet Take 3 tablets (75 mg total) by mouth 2 (two) times daily. 180 tablet 3  . potassium chloride SA (K-DUR,KLOR-CON) 20 MEQ tablet Take 2 tablets daily 60 tablet 6  . warfarin (COUMADIN) 5 MG tablet Take 1 tablet (5 mg total) by mouth daily at 6 PM. 30 tablet 1  . oxyCODONE (OXY IR/ROXICODONE) 5 MG immediate release tablet Take 1 tablet (5 mg total) by mouth every 4 (four) hours as needed for moderate pain or severe pain. (Patient not taking: Reported on 04/06/2015) 30 tablet 0   No current facility-administered medications on file prior to visit.     Review of Systems  Constitutional: Positive for fatigue. Negative for appetite change.  Eyes: Positive for visual disturbance. Negative for pain  and redness.  Respiratory: Negative for cough, shortness of breath and wheezing.   Cardiovascular: Positive for chest pain.  Gastrointestinal:       See HPI   Endocrine: Negative.   Genitourinary: Negative for dysuria and flank pain.  Psychiatric/Behavioral: Negative for behavioral problems.       Objective:   Physical Exam  Constitutional: He appears well-developed. No distress.  Ill looking  HENT:  Head: Normocephalic.  Right Ear: External ear normal.  Left Ear: External ear normal.  Mouth/Throat: Oropharynx is clear and moist.  Eyes: Conjunctivae and EOM are normal. Pupils are equal, round, and reactive to light.  Neck: Normal range of motion.  Cardiovascular: Normal rate, regular rhythm and normal heart sounds.   No murmur heard. Pulmonary/Chest: No respiratory distress. He has no rales.  Abdominal: Soft. Bowel sounds are normal. He exhibits no  mass. There is no tenderness. There is no guarding.  Skin: Skin is warm. He is not diaphoretic.   Lab Results  Component Value Date   HGBA1C 7.0 03/08/2015    .       Assessment & Plan:  35 y/o male with multiple co-morbidities , recently completed treatment for MSSA mitral valve infective endocarditis now with visual problems and diarrhea.  Hypertension: Uncontrolled blood pressure due to the fact that he is yet to take meds Advised to take his medications once  He Returns Home  Diarrhea: Suspicious for Clostridium difficile given he has been on antibiotics for the last 6 weeks and so I am sending off stool specimens for ova and culture and C. difficile and I will treat him presumptively for C. difficile given his premorbid conditions and risk factors (this is likely to lead to a supratherapeutic INR and so I have informed him so his INR can be monitored sooner) Advised to increase oral fluid intake and if he continues to feel weak he would have to present to the emergency room. I am also checking his electrolytes for any  derangements. He might need a stool specimen for H. pylori if symptoms persist. Low suspicion for intestinal obstruction as he is moving his bowel currently. Will need to report to the ED if symptoms do not resolve.  Blurry vision This is unlikely due to his blood sugars as his CBG in the clinic is normal and his Diabetes is well controlled with a Hgba1c of 7.0 We will attempt to reach the ophthalmologist to see can give him a sooner  appointment than 04/18/2015 given his history of infective endocarditis he is at high risk for a septic embolus to the eye

## 2015-04-06 NOTE — Patient Instructions (Signed)

## 2015-04-07 ENCOUNTER — Telehealth: Payer: Self-pay

## 2015-04-07 LAB — OVA AND PARASITE EXAMINATION: OP: NONE SEEN

## 2015-04-08 ENCOUNTER — Telehealth: Payer: Self-pay

## 2015-04-08 NOTE — Telephone Encounter (Signed)
-----   Message from Jaclyn ShaggyEnobong Amao, MD sent at 04/07/2015 12:52 PM EDT ----- Please inform him that his renal function is stable, his potassium is low side note advised him to increase intake of foods rich in potassium as this could be secondary to his ongoing diarrhea. Thank you

## 2015-04-08 NOTE — Telephone Encounter (Signed)
Nurse called patient to inform him of lab results. No answer, nurse left voice mail [facility] with number to call back.

## 2015-04-08 NOTE — Telephone Encounter (Signed)
See previous note

## 2015-04-08 NOTE — Telephone Encounter (Signed)
Nurse called patient to ask reason he missed morning appt at opthalmology office. No answer. Nurse left message on voicemail with nurse's name and return number.

## 2015-04-10 LAB — STOOL CULTURE

## 2015-04-11 ENCOUNTER — Telehealth: Payer: Self-pay | Admitting: Cardiology

## 2015-04-11 LAB — CLOSTRIDIUM DIFFICILE CULTURE-FECAL

## 2015-04-11 NOTE — Telephone Encounter (Signed)
Checked with MR and orders were faxed on 04/05/2015 - they will be refaxed today.

## 2015-04-11 NOTE — Telephone Encounter (Signed)
New Message  [facility] [facility] Checking on a skilled nursing home assistance order for the patient that was sent on 03/30. Their office has not received a call back nor a returned order. Will fax another order. ( Per Nurse Dr. Anne FuSkains normally fills out the pt's orders)

## 2015-04-12 ENCOUNTER — Telehealth: Payer: Self-pay

## 2015-04-12 NOTE — Telephone Encounter (Signed)
-----   Message from Jaclyn ShaggyEnobong Amao, MD sent at 04/12/2015  8:37 AM EDT ----- Please inform the patient that labs are normal. Thank you.

## 2015-04-12 NOTE — Telephone Encounter (Signed)
Nurse called patient, patient verified date of birth. Patient aware labs are normal and voices understanding. Nurse questioned patient about the reason he did not show up to his ophthalmology appt scheduled. Patient explains he could not find the opthalmology office. He called and left a message at the ophthalmologist but did not get a return call. Nurse encouraged patient to call ophthalmologist office until he gets an appt to have his eye checked. Patient reports eye is getting worse. Patient agrees to call ophthalmologist office to make appt.

## 2015-04-12 NOTE — Progress Notes (Signed)
Quick Note:  Please inform the patient that labs are normal. Thank you. ______ 

## 2015-04-14 ENCOUNTER — Emergency Department (HOSPITAL_COMMUNITY)
Admission: EM | Admit: 2015-04-14 | Discharge: 2015-04-15 | Disposition: A | Discharge status: Home / Self Care | Payer: Medicaid Other | Attending: Emergency Medicine | Admitting: Emergency Medicine

## 2015-04-14 ENCOUNTER — Other Ambulatory Visit: Payer: Self-pay | Admitting: Physician Assistant

## 2015-04-14 ENCOUNTER — Ambulatory Visit (INDEPENDENT_AMBULATORY_CARE_PROVIDER_SITE_OTHER): Payer: Medicaid Other | Admitting: *Deleted

## 2015-04-14 ENCOUNTER — Other Ambulatory Visit: Payer: Self-pay | Admitting: Cardiology

## 2015-04-14 ENCOUNTER — Telehealth: Payer: Self-pay | Admitting: Internal Medicine

## 2015-04-14 ENCOUNTER — Emergency Department (HOSPITAL_COMMUNITY): Payer: Medicaid Other

## 2015-04-14 ENCOUNTER — Encounter (HOSPITAL_COMMUNITY): Payer: Self-pay | Admitting: Emergency Medicine

## 2015-04-14 DIAGNOSIS — Y92410 Unspecified street and highway as the place of occurrence of the external cause: Secondary | ICD-10-CM | POA: Diagnosis not present

## 2015-04-14 DIAGNOSIS — Z5181 Encounter for therapeutic drug level monitoring: Secondary | ICD-10-CM | POA: Diagnosis not present

## 2015-04-14 DIAGNOSIS — Z9114 Patient's other noncompliance with medication regimen: Secondary | ICD-10-CM

## 2015-04-14 DIAGNOSIS — Z9889 Other specified postprocedural states: Secondary | ICD-10-CM | POA: Diagnosis not present

## 2015-04-14 DIAGNOSIS — Z794 Long term (current) use of insulin: Secondary | ICD-10-CM | POA: Diagnosis not present

## 2015-04-14 DIAGNOSIS — Y9389 Activity, other specified: Secondary | ICD-10-CM | POA: Insufficient documentation

## 2015-04-14 DIAGNOSIS — R0789 Other chest pain: Secondary | ICD-10-CM

## 2015-04-14 DIAGNOSIS — Z7982 Long term (current) use of aspirin: Secondary | ICD-10-CM | POA: Insufficient documentation

## 2015-04-14 DIAGNOSIS — Z9119 Patient's noncompliance with other medical treatment and regimen: Secondary | ICD-10-CM | POA: Insufficient documentation

## 2015-04-14 DIAGNOSIS — Y998 Other external cause status: Secondary | ICD-10-CM | POA: Insufficient documentation

## 2015-04-14 DIAGNOSIS — I10 Essential (primary) hypertension: Secondary | ICD-10-CM | POA: Diagnosis not present

## 2015-04-14 DIAGNOSIS — Z79899 Other long term (current) drug therapy: Secondary | ICD-10-CM | POA: Insufficient documentation

## 2015-04-14 DIAGNOSIS — S3991XA Unspecified injury of abdomen, initial encounter: Secondary | ICD-10-CM | POA: Insufficient documentation

## 2015-04-14 DIAGNOSIS — Z87891 Personal history of nicotine dependence: Secondary | ICD-10-CM | POA: Insufficient documentation

## 2015-04-14 DIAGNOSIS — Z8619 Personal history of other infectious and parasitic diseases: Secondary | ICD-10-CM | POA: Diagnosis not present

## 2015-04-14 DIAGNOSIS — S29001A Unspecified injury of muscle and tendon of front wall of thorax, initial encounter: Secondary | ICD-10-CM | POA: Insufficient documentation

## 2015-04-14 DIAGNOSIS — Z87448 Personal history of other diseases of urinary system: Secondary | ICD-10-CM | POA: Insufficient documentation

## 2015-04-14 DIAGNOSIS — E119 Type 2 diabetes mellitus without complications: Secondary | ICD-10-CM | POA: Diagnosis not present

## 2015-04-14 LAB — CBC WITH DIFFERENTIAL/PLATELET
BASOS ABS: 0 10*3/uL (ref 0.0–0.1)
BASOS PCT: 1 % (ref 0–1)
EOS PCT: 4 % (ref 0–5)
Eosinophils Absolute: 0.2 10*3/uL (ref 0.0–0.7)
HCT: 33.4 % — ABNORMAL LOW (ref 39.0–52.0)
Hemoglobin: 11.4 g/dL — ABNORMAL LOW (ref 13.0–17.0)
LYMPHS ABS: 1.1 10*3/uL (ref 0.7–4.0)
LYMPHS PCT: 18 % (ref 12–46)
MCH: 27.5 pg (ref 26.0–34.0)
MCHC: 34.1 g/dL (ref 30.0–36.0)
MCV: 80.5 fL (ref 78.0–100.0)
MONOS PCT: 6 % (ref 3–12)
Monocytes Absolute: 0.3 10*3/uL (ref 0.1–1.0)
Neutro Abs: 4.3 10*3/uL (ref 1.7–7.7)
Neutrophils Relative %: 71 % (ref 43–77)
PLATELETS: 227 10*3/uL (ref 150–400)
RBC: 4.15 MIL/uL — AB (ref 4.22–5.81)
RDW: 14.2 % (ref 11.5–15.5)
WBC: 5.9 10*3/uL (ref 4.0–10.5)

## 2015-04-14 LAB — COMPREHENSIVE METABOLIC PANEL
ALBUMIN: 2.4 g/dL — AB (ref 3.5–5.2)
ALK PHOS: 94 U/L (ref 39–117)
ALT: 11 U/L (ref 0–53)
AST: 17 U/L (ref 0–37)
Anion gap: 13 (ref 5–15)
BUN: 9 mg/dL (ref 6–23)
CO2: 22 mmol/L (ref 19–32)
Calcium: 8.3 mg/dL — ABNORMAL LOW (ref 8.4–10.5)
Chloride: 104 mmol/L (ref 96–112)
Creatinine, Ser: 1.87 mg/dL — ABNORMAL HIGH (ref 0.50–1.35)
GFR calc Af Amer: 53 mL/min — ABNORMAL LOW (ref >90)
GFR calc non Af Amer: 45 mL/min — ABNORMAL LOW (ref >90)
Glucose, Bld: 346 mg/dL — ABNORMAL HIGH (ref 70–99)
POTASSIUM: 2.9 mmol/L — AB (ref 3.5–5.1)
SODIUM: 139 mmol/L (ref 135–145)
TOTAL PROTEIN: 5.6 g/dL — AB (ref 6.0–8.3)
Total Bilirubin: 0.5 mg/dL (ref 0.3–1.2)

## 2015-04-14 LAB — POCT INR: INR: 1

## 2015-04-14 LAB — PROTIME-INR
INR: 0.99 (ref 0.00–1.49)
Prothrombin Time: 13.2 seconds (ref 11.6–15.2)

## 2015-04-14 MED ORDER — MORPHINE SULFATE 4 MG/ML IJ SOLN: 4.0000 mg | Freq: Once | INTRAMUSCULAR | Status: DC

## 2015-04-14 MED ORDER — WARFARIN SODIUM 5 MG PO TABS: 5.0000 mg | ORAL_TABLET | Freq: Every day | ORAL | Status: DC

## 2015-04-14 MED ORDER — MORPHINE SULFATE 4 MG/ML IJ SOLN
4.0000 mg | Freq: Once | INTRAMUSCULAR | Status: DC
  Filled 2015-04-14: qty 1

## 2015-04-14 MED ORDER — SODIUM CHLORIDE 0.9 % IV BOLUS (SEPSIS)
500.0000 mL | Freq: Once | INTRAVENOUS | Status: AC
  Administered 2015-04-14: 500 mL via INTRAVENOUS

## 2015-04-14 MED ORDER — MORPHINE SULFATE 4 MG/ML IJ SOLN
4.0000 mg | Freq: Once | INTRAMUSCULAR | Status: AC
  Administered 2015-04-14: 4 mg via INTRAVENOUS

## 2015-04-14 NOTE — ED Provider Notes (Addendum)
Procedure note: Ultrasound Guided Peripheral IV Ultrasound guided 20 g peripheral 1.88 inch angiocath IV placement performed by me. Indications: Nursing unable to place IV. Details: The right antecubital fossa and upper arm was evaluated with a multifrequency linear probe. Several patent brachial veins are noted. 1 attempts were made to cannulate a basilic vein under realtime US guidance with successful cannulation of the vein and catheter placement. There is return of non-pulsatile dark red blood. The patient tolerated the procedure well without complications.     Purvis SheffieldForrest Alannah Averhart, MD 04/14/15 [id number]  Purvis SheffieldForrest Melinna Linarez, MD 04/17/15 [phone]

## 2015-04-14 NOTE — ED Notes (Signed)
MD at the bedside  
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
## 2015-04-14 NOTE — ED Notes (Signed)
Patient returned from X-ray 

## 2015-04-14 NOTE — ED Notes (Signed)
Unable to access IV x2.

## 2015-04-14 NOTE — ED Notes (Signed)
MD Romeo AppleHarrison at the bedside attempting IV.

## 2015-04-14 NOTE — Telephone Encounter (Signed)
Opthalmology office called our facility stating that the patient would need to be referred to a different facility. They stated that the patient no showed the appointment nor did the patient call and they will no longer schedule the patient.

## 2015-04-14 NOTE — ED Notes (Signed)
SPoke with CT. Unable to access patient with IV.

## 2015-04-14 NOTE — ED Notes (Signed)
MD at the bedside. Phlebotomy at the bedside. Attempted IV access x2, Unable to access.

## 2015-04-14 NOTE — ED Notes (Signed)
CT made aware.

## 2015-04-15 ENCOUNTER — Emergency Department (HOSPITAL_COMMUNITY): Payer: Medicaid Other

## 2015-04-15 ENCOUNTER — Ambulatory Visit: Payer: Self-pay | Admitting: Internal Medicine

## 2015-04-15 ENCOUNTER — Encounter (HOSPITAL_COMMUNITY): Payer: Self-pay | Admitting: Radiology

## 2015-04-15 MED ORDER — ONDANSETRON HCL 4 MG/2ML IJ SOLN
4.0000 mg | Freq: Once | INTRAMUSCULAR | Status: AC
  Administered 2015-04-15: 4 mg via INTRAVENOUS

## 2015-04-15 MED ORDER — OXYCODONE-ACETAMINOPHEN 5-325 MG PO TABS: 1.0000 | ORAL_TABLET | ORAL | Status: DC | PRN

## 2015-04-15 MED ORDER — IOHEXOL 300 MG/ML  SOLN
80.0000 mL | Freq: Once | INTRAMUSCULAR | Status: AC | PRN
  Administered 2015-04-15: 80 mL via INTRAVENOUS

## 2015-04-15 MED ORDER — WARFARIN SODIUM 5 MG PO TABS: 5.0000 mg | ORAL_TABLET | Freq: Every day | ORAL | Status: DC

## 2015-04-15 MED ORDER — CLONIDINE HCL 0.2 MG PO TABS
0.3000 mg | ORAL_TABLET | Freq: Once | ORAL | Status: AC
  Administered 2015-04-15: 0.3 mg via ORAL
  Filled 2015-04-15 (×2): qty 1

## 2015-04-15 MED ORDER — CYCLOBENZAPRINE HCL 10 MG PO TABS: 10.0000 mg | ORAL_TABLET | Freq: Two times a day (BID) | ORAL | Status: DC | PRN

## 2015-04-15 MED ORDER — METOPROLOL TARTRATE 25 MG PO TABS
75.0000 mg | ORAL_TABLET | Freq: Once | ORAL | Status: AC
  Administered 2015-04-15: 75 mg via ORAL
  Filled 2015-04-15: qty 3

## 2015-04-15 MED ORDER — POTASSIUM CHLORIDE CRYS ER 20 MEQ PO TBCR
40.0000 meq | EXTENDED_RELEASE_TABLET | Freq: Once | ORAL | Status: AC
  Administered 2015-04-15: 40 meq via ORAL
  Filled 2015-04-15: qty 2

## 2015-04-15 MED ORDER — HYDROMORPHONE HCL 1 MG/ML IJ SOLN
1.0000 mg | Freq: Once | INTRAMUSCULAR | Status: AC
  Administered 2015-04-15: 1 mg via INTRAVENOUS
  Filled 2015-04-15: qty 1

## 2015-04-15 NOTE — ED Notes (Signed)
Pt had emesis bag that had vomit in it.
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
## 2015-04-18 ENCOUNTER — Encounter: Payer: Self-pay | Admitting: Internal Medicine

## 2015-04-25 NOTE — Telephone Encounter (Signed)
Nurse called patient, reached voicemail. Left message for patient to call Joao Mccurdy at [phone].   

## 2015-04-26 ENCOUNTER — Ambulatory Visit (INDEPENDENT_AMBULATORY_CARE_PROVIDER_SITE_OTHER): Payer: Medicaid Other | Admitting: Internal Medicine

## 2015-04-26 ENCOUNTER — Encounter: Payer: Self-pay | Admitting: Internal Medicine

## 2015-04-26 VITALS — BP 193/135 | HR 96 | Temp 98.5°F | Ht 74.0 in | Wt 231.0 lb

## 2015-04-26 DIAGNOSIS — I5021 Acute systolic (congestive) heart failure: Secondary | ICD-10-CM | POA: Diagnosis present

## 2015-04-26 MED ORDER — FUROSEMIDE 40 MG PO TABS: 40.0000 mg | ORAL_TABLET | Freq: Two times a day (BID) | ORAL | Status: DC
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
## 2015-04-27 NOTE — Progress Notes (Signed)
Cardiology Office Note   Date:  04/27/2015   ID:  Dwayne [name], DOB 10/27/1980, MRN [id number]  Patient Care Team: Quentin Angst, MD as PCP - General (Internal Medicine) Annie Sable, MD as Consulting Physician (Nephrology) Cliffton Asters, MD as Consulting Physician (Infectious Diseases) Purcell Nails, MD as Consulting Physician (Cardiothoracic Surgery) Thurmon Fair, MD as Consulting Physician (Cardiology)     No chief complaint on file.    History of Present Illness: Dwayne [name] is a 36 y.o. male with a hx of DM2, HTN, chronic diabetic ulcers. He was admitted to the hospital in January (1/10-1/28) with generalized weakness and fatigue, fevers, chills and diarrhea in the setting of mitral valve endocarditis (MSSA bacteremia). He had perforation of the posterior leaflet resulting in severe mitral regurgitation. 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 There was evidence of septic emboli to the brain possible emboli to the kidney. He was evaluated by multiple specialists. He had dental extraction with oral surgery. Nephrology followed his chronic kidney disease. He was diagnosed with PAD (Suspected embolization to left leg; long history of LLE claudication-left ABI 0.64). He was followed by vascular surgery and outpatient follow-up was planned. Orthopedics helped with management of his shoulder infection. He required IV antibiotics for 6 weeks prior to proceeding with mitral valve surgery.  Of note, cardiac catheterization demonstrated normal coronary arteries.    He was readmitted 2/3-2/26. He presented with volume overload. He underwent arthroscopic shoulder surgery due to his septic right shoulder with extensive debridement of the glenohumeral joint and subacromial decompression.  He ultimately underwent Minimally invasive mitral valve repair on 02/18/15 with Dr. Cornelius Moras. He was started on Coumadin postoperatively. He remained in NSR. He required 6 weeks of ampicillin as an outpatient.    I saw him in follow-up in 03/17/15. Follow-up echo was arranged. This demonstrated stable mitral valve repair and normal LV function. He did have moderate diastolic dysfunction. Blood pressure was uncontrolled when I saw him. I adjusted his hydralazine. He did have significant orthostatic hypotension when he came into the office for his echo. He was seen by Huey Bienenstock, PA-C. His Lasix was adjusted. Since last seen, he has been to the emergency room with visual disturbance. He was referred outpatient ophthalmology. He did miss that appointment. He was also seen by primary care for diarrhea. He was treated presumptively for C. difficile. However, his C. difficile toxin returned negative. He followed up with infectious disease this week. He was stable off of antibiotics. He had recently run out of his Lasix and this was refilled due to lower extremity edema.    Studies/Reports Reviewed Today:  Echo 03/31/15 - Severeconcentric hypertrophy. EF 60% to 65%. Wallmotion was normal.  Grade 2 diastolic dysfunction). - Mitral valve: Prior procedures included surgical repair. Anannular ring prosthesis was present and functioning normally. There was no significant regurgitation. Mean gradient (D): 6 mmHg. - Left atrium: The atrium was mildly dilated. Impressions: There was no evidence of a vegetation.  Echo 02/04/15 - Moderate LVH. EF 55-60%. Wall motion was normal.  Grade 2 diastolic dysfunction - Mitral valve: There was severe regurgitation. - Left atrium: The atrium was mildly dilated. - PA peak pressure: 46 mm Hg (S).-  moderately increased. - Pericardium, extracardiac: A trivial pericardial effusion was  identified. There was a left pleural effusion. Impressions:  Normal LV function; moderate LVH;  grade 2 diastolic dysfunction; mild LAE; severe MR (possible perforation of posterior MV leaflet noted on apical 2 chamber view); moderately elevated pulmonary pressure.  Carotid US 1/16 1-39 percent stenosis involving the right internal carotid artery and the left internal carotid artery.  Cardiac cath 1/16 Left mainstem: Normal   Left anterior descending (LAD): Normal Left circumflex (LCx): Normal Right coronary artery (RCA): Normal.       Past Medical History  Diagnosis Date  . Hypertension   . Acute renal failure   . Bacteremia     MSSA   . Diabetes type 2, uncontrolled 01/09/2015  . 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 Essential hypertension, benign 01/18/2014  . Infective myositis of left thigh   . Noncompliance 01/18/2014  . Septic shock   . Staphylococcus aureus bacteremia with sepsis   . Tobacco abuse 01/15/2014  . Mitral regurgitation 01/13/2015    Severe by TEE  . MSSA (methicillin susceptible Staphylococcus aureus)   . Type II diabetes mellitus     not controlled  . Diabetic foot infection 01/15/2014  . 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 I&d extremity Bilateral 01/16/2014    Procedure:  DEBRIDEMENT of bilateral foot ulcers;  Surgeon: Kathryne Hitchhristopher Y Blackman, MD;  [facility];  Service: Orthopedics;  Laterality: Bilateral;  . Tee without cardioversion N/A 01/13/2015    Procedure: TRANSESOPHAGEAL ECHOCARDIOGRAM (TEE);  Surgeon: Thurmon FairMihai Croitoru, MD;  [facility];  Service: Cardiovascular;  Laterality: N/A;  . Left and right heart catheterization with coronary angiogram N/A 01/24/2015    Procedure: LEFT AND RIGHT HEART CATHETERIZATION WITH CORONARY ANGIOGRAM;  Surgeon: Peter M SwazilandJordan, MD;  [facility];  Service: Cardiovascular;  Laterality: N/A;  . Multiple extractions with alveoloplasty N/A 01/25/2015    Procedure: Extraction of tooth #'s 1,2,3,4,5,6,8,9,11,12,13,14,15,16,17,18,19 28,31,32 with alveoloplasty and gross debridement of remaining teeth.;  Surgeon: Charlynne Panderonald F Kulinski, DDS;  [facility];  Service: Oral Surgery;  Laterality: N/A;  . Cardiac catheterization  12/2014  . Shoulder arthroscopy Right 02/05/2015    Procedure: ARTHROSCOPY SHOULDER;  Surgeon: Nadara MustardMarcus Duda V, MD;  [facility];  Service: Orthopedics;  Laterality: Right;  . Mitral valve replacement Right 02/18/2015    Procedure: MINIMALLY INVASIVE MITRAL VALVE (MV) REPAIR;  Surgeon: Purcell Nailslarence H Owen, MD;  Location: [facility] OR;  Service: Open Heart Surgery;  Laterality: Right;  . Tee without cardioversion N/A 02/18/2015    Procedure: TRANSESOPHAGEAL ECHOCARDIOGRAM (TEE);  Surgeon: Purcell Nailslarence H Owen, MD;  [facility];  Service: Open Heart Surgery;  Laterality: N/A;     Current Outpatient Prescriptions  Medication Sig Dispense Refill  . aspirin EC 325 MG EC tablet Take 1 tablet (325 mg total) by mouth daily. (Patient not taking: Reported on 04/26/2015) 30 tablet 0  . cloNIDine (CATAPRES) 0.3 MG tablet Take 1 tablet (0.3 mg total) by mouth 3 (three) times daily. 90 tablet 3  . cyclobenzaprine (FLEXERIL) 10 MG tablet Take 1 tablet (10 mg total) by mouth 2 (two) times daily as needed for muscle spasms. (Patient not taking: Reported on 04/26/2015) 20 tablet 0  . furosemide (LASIX) 40 MG tablet Take 1 tablet (40 mg total) by mouth 2 (two) times daily. 60 tablet 5  .  hydrALAZINE (APRESOLINE) 50 MG tablet Take 1 tablet (50 mg total) by mouth 3 (three) times daily. 90 tablet 11  . insulin aspart protamine- aspart (NOVOLOG MIX 70/30) (70-30) 100 UNIT/ML injection Inject 0.14 mLs (14 Units total) into the skin 2 (two) times daily with a meal. 10 mL 11  . Insulin Syringe-Needle U-100 28G X 1/2" 0.5 ML MISC Use as directed. Any generic  available. 100 each 12  . Lancets (FREESTYLE) lancets Any generic available. Use as instructed 100 each 12  . metoprolol tartrate (LOPRESSOR) 25 MG tablet Take 3 tablets (75 mg total) by mouth 2 (two) times daily. 180 tablet 3  . metroNIDAZOLE (FLAGYL) 500 MG tablet Take 1 tablet (500 mg total) by mouth 3 (three) times daily. 30 tablet 0  . oxyCODONE (OXY IR/ROXICODONE) 5 MG immediate release tablet Take 1 tablet (5 mg total) by mouth every 4 (four) hours as needed for moderate pain or severe pain. (Patient not taking: Reported on 04/06/2015) 30 tablet 0  . oxyCODONE-acetaminophen (PERCOCET/ROXICET) 5-325 MG per tablet Take 1-2 tablets by mouth every 4 (four) hours as needed for severe pain. 15 tablet 0  . potassium chloride SA (K-DUR,KLOR-CON) 20 MEQ tablet Take 2 tablets daily 60 tablet 6  . warfarin (COUMADIN) 5 MG tablet Take 1 tablet (5 mg total) by mouth daily at 6 PM. 60 tablet 0  . warfarin (COUMADIN) 5 MG tablet Take 1 tablet (5 mg total) by mouth daily. 15 tablet 0   No current facility-administered medications for this visit.    Allergies:   Review of patient's allergies indicates no known allergies.    Social History:  The patient  reports that he quit smoking about 3 months ago. His smoking use included Cigars. He has never used smokeless tobacco. He reports that he does not drink alcohol or use illicit drugs.   Family History:  The patient's family history includes Diabetes in his brother; Hypertension in his mother; Stroke in his maternal uncle. There is no history of Heart attack.    ROS:   Please see the history of present illness.   ROS   PHYSICAL EXAM: VS:  There were no vitals taken for this visit.    Wt Readings from Last 3 Encounters:  04/26/15 231 lb (104.781 kg)  04/06/15 216 lb 3.2 oz (98.068 kg)  03/31/15 227 lb (102.967 kg)     GEN: Well nourished, well developed, in no acute distress HEENT: normal Neck: no JVD, no masses Cardiac:  Normal S1/S2, RRR; no  murmur ,  no rubs or gallops, trace edema  Respiratory:  Decreased breath sounds at the bases (R >L) bilaterally, no wheezing, rhonchi or rales. GI: soft, nontender, nondistended, + BS MS: no deformity or atrophy Skin: warm and dry  Neuro:  CNs II-XII intact, Strength and sensation are intact Psych: Normal affect   EKG:  EKG is ordered today.  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  The patient does not have concerns regarding medicines.  The following changes have been made:      Labs/ tests ordered today include:  No orders of the defined types were placed in this encounter.    Disposition:   FU    Signed, Brynda Rim, MHS 04/27/2015 5:49 PM    [facility] [address] [employer] [place], [place], [place]  [address] Phone: [phone]; Fax: [phone]   ADDENDUM 04/27/2015 5:49 PM: Reviewed with Dr. Cornelius Moras and reviewed chart again.  The patient was seen by Dr. Delane Ginger once during his 2 prolonged hospital stays.  He was seen by Dr. Rachelle Hora Croitoru more and he followed him closer to [place] after his surgery.  We felt like it made the most sense for the patient to FU with Dr. Thurmon Fair.  Therefore, he  will FU with him instead.  The patient will be notified. Tereso Newcomer, PA-C   04/27/2015 5:49 PM     This encounter was created in error - please disregard.

## 2015-04-28 ENCOUNTER — Encounter: Payer: Self-pay | Admitting: Physician Assistant

## 2015-04-29 ENCOUNTER — Ambulatory Visit (HOSPITAL_BASED_OUTPATIENT_CLINIC_OR_DEPARTMENT_OTHER): Payer: Medicaid Other | Admitting: Family Medicine

## 2015-04-29 ENCOUNTER — Institutional Professional Consult (permissible substitution): Payer: Self-pay | Admitting: Cardiovascular Disease

## 2015-04-29 ENCOUNTER — Ambulatory Visit: Payer: Self-pay | Admitting: Internal Medicine

## 2015-04-29 ENCOUNTER — Encounter (HOSPITAL_COMMUNITY): Payer: Self-pay

## 2015-04-29 ENCOUNTER — Encounter: Payer: Self-pay | Admitting: Family Medicine

## 2015-04-29 ENCOUNTER — Inpatient Hospital Stay (HOSPITAL_COMMUNITY)
Admission: EM | Admit: 2015-04-29 | Discharge: 2015-04-29 | DRG: 638 | Discharge status: Against Medical Advice | Payer: Medicaid Other | Attending: Emergency Medicine | Admitting: Emergency Medicine

## 2015-04-29 VITALS — BP 161/93 | HR 65 | Temp 98.0°F | Resp 18

## 2015-04-29 DIAGNOSIS — N183 Chronic kidney disease, stage 3 unspecified: Secondary | ICD-10-CM | POA: Diagnosis present

## 2015-04-29 DIAGNOSIS — I5031 Acute diastolic (congestive) heart failure: Secondary | ICD-10-CM

## 2015-04-29 DIAGNOSIS — R739 Hyperglycemia, unspecified: Secondary | ICD-10-CM | POA: Insufficient documentation

## 2015-04-29 DIAGNOSIS — R531 Weakness: Secondary | ICD-10-CM

## 2015-04-29 DIAGNOSIS — E11649 Type 2 diabetes mellitus with hypoglycemia without coma: Secondary | ICD-10-CM | POA: Diagnosis not present

## 2015-04-29 DIAGNOSIS — E876 Hypokalemia: Secondary | ICD-10-CM | POA: Diagnosis present

## 2015-04-29 DIAGNOSIS — I5032 Chronic diastolic (congestive) heart failure: Secondary | ICD-10-CM | POA: Diagnosis present

## 2015-04-29 DIAGNOSIS — Z79899 Other long term (current) drug therapy: Secondary | ICD-10-CM | POA: Diagnosis not present

## 2015-04-29 DIAGNOSIS — Z7982 Long term (current) use of aspirin: Secondary | ICD-10-CM

## 2015-04-29 DIAGNOSIS — I10 Essential (primary) hypertension: Secondary | ICD-10-CM

## 2015-04-29 DIAGNOSIS — E162 Hypoglycemia, unspecified: Secondary | ICD-10-CM | POA: Diagnosis present

## 2015-04-29 DIAGNOSIS — I129 Hypertensive chronic kidney disease with stage 1 through stage 4 chronic kidney disease, or unspecified chronic kidney disease: Secondary | ICD-10-CM | POA: Diagnosis present

## 2015-04-29 DIAGNOSIS — Z794 Long term (current) use of insulin: Secondary | ICD-10-CM

## 2015-04-29 DIAGNOSIS — Z87891 Personal history of nicotine dependence: Secondary | ICD-10-CM

## 2015-04-29 DIAGNOSIS — E1165 Type 2 diabetes mellitus with hyperglycemia: Secondary | ICD-10-CM | POA: Diagnosis present

## 2015-04-29 DIAGNOSIS — IMO0002 Reserved for concepts with insufficient information to code with codable children: Secondary | ICD-10-CM | POA: Diagnosis present

## 2015-04-29 DIAGNOSIS — Z7901 Long term (current) use of anticoagulants: Secondary | ICD-10-CM

## 2015-04-29 DIAGNOSIS — Z952 Presence of prosthetic heart valve: Secondary | ICD-10-CM | POA: Diagnosis not present

## 2015-04-29 DIAGNOSIS — I160 Hypertensive urgency: Secondary | ICD-10-CM | POA: Diagnosis present

## 2015-04-29 LAB — CBG MONITORING, ED
GLUCOSE-CAPILLARY: 85 mg/dL (ref 70–99)
Glucose-Capillary: 40 mg/dL — CL (ref 70–99)
Glucose-Capillary: 72 mg/dL (ref 70–99)
Glucose-Capillary: 86 mg/dL (ref 70–99)
Glucose-Capillary: 68 mg/dL — ABNORMAL LOW (ref 70–99)
Glucose-Capillary: 164 mg/dL — ABNORMAL HIGH (ref 70–99)

## 2015-04-29 LAB — CBC WITH DIFFERENTIAL/PLATELET
Basophils Absolute: 0 10*3/uL (ref 0.0–0.1)
Basophils Relative: 0 % (ref 0–1)
Eosinophils Absolute: 0.2 10*3/uL (ref 0.0–0.7)
Eosinophils Relative: 4 % (ref 0–5)
HCT: 32 % — ABNORMAL LOW (ref 39.0–52.0)
Hemoglobin: 10.7 g/dL — ABNORMAL LOW (ref 13.0–17.0)
Lymphocytes Relative: 31 % (ref 12–46)
Lymphs Abs: 1.4 10*3/uL (ref 0.7–4.0)
MCH: 27.4 pg (ref 26.0–34.0)
MCHC: 33.4 g/dL (ref 30.0–36.0)
MCV: 82.1 fL (ref 78.0–100.0)
Monocytes Absolute: 0.3 10*3/uL (ref 0.1–1.0)
Monocytes Relative: 6 % (ref 3–12)
Neutro Abs: 2.8 10*3/uL (ref 1.7–7.7)
Neutrophils Relative %: 59 % (ref 43–77)
Platelets: 214 10*3/uL (ref 150–400)
RBC: 3.9 MIL/uL — ABNORMAL LOW (ref 4.22–5.81)
RDW: 13.9 % (ref 11.5–15.5)
WBC: 4.7 10*3/uL (ref 4.0–10.5)

## 2015-04-29 LAB — POCT URINALYSIS DIPSTICK
Glucose, UA: 500
Ketones, UA: NEGATIVE
Leukocytes, UA: NEGATIVE
Nitrite, UA: NEGATIVE
Spec Grav, UA: 1.025
UROBILINOGEN UA: 1
pH, UA: 6

## 2015-04-29 LAB — BASIC METABOLIC PANEL
Anion gap: 7 (ref 5–15)
BUN: 18 mg/dL (ref 6–23)
CO2: 27 mmol/L (ref 19–32)
Calcium: 8.5 mg/dL (ref 8.4–10.5)
Chloride: 108 mmol/L (ref 96–112)
Creatinine, Ser: 2.02 mg/dL — ABNORMAL HIGH (ref 0.50–1.35)
GFR calc Af Amer: 48 mL/min — ABNORMAL LOW (ref >90)
GFR calc non Af Amer: 41 mL/min — ABNORMAL LOW (ref >90)
Glucose, Bld: 94 mg/dL (ref 70–99)
Potassium: 2.8 mmol/L — ABNORMAL LOW (ref 3.5–5.1)
Sodium: 142 mmol/L (ref 135–145)

## 2015-04-29 LAB — GLUCOSE, POCT (MANUAL RESULT ENTRY)
POC GLUCOSE: 408 mg/dL — AB (ref 70–99)
POC GLUCOSE: 267 mg/dL — AB (ref 70–99)

## 2015-04-29 MED ORDER — DEXTROSE 50 % IV SOLN
25.0000 mL | Freq: Once | INTRAVENOUS | Status: DC
  Filled 2015-04-29: qty 50

## 2015-04-29 MED ORDER — MORPHINE SULFATE 4 MG/ML IJ SOLN: 4.0000 mg | Freq: Once | INTRAMUSCULAR | Status: DC

## 2015-04-29 MED ORDER — INSULIN ASPART 100 UNIT/ML ~~LOC~~ SOLN
20.0000 [IU] | Freq: Once | SUBCUTANEOUS | Status: AC
  Administered 2015-04-29: 20 [IU] via SUBCUTANEOUS

## 2015-04-29 MED ORDER — CLONIDINE HCL 0.2 MG PO TABS
0.3000 mg | ORAL_TABLET | Freq: Three times a day (TID) | ORAL | Status: DC
  Administered 2015-04-29: 0.3 mg via ORAL
  Filled 2015-04-29 (×2): qty 1

## 2015-04-29 MED ORDER — INSULIN ASPART 100 UNIT/ML ~~LOC~~ SOLN: 0.0000 [IU] | SUBCUTANEOUS | Status: DC

## 2015-04-29 MED ORDER — SODIUM CHLORIDE 0.9 % IV BOLUS (SEPSIS)
500.0000 mL | Freq: Once | INTRAVENOUS | Status: AC
  Administered 2015-04-29: 500 mL via INTRAVENOUS

## 2015-04-29 MED ORDER — HYDRALAZINE HCL 20 MG/ML IJ SOLN: 10.0000 mg | INTRAMUSCULAR | Status: DC | PRN

## 2015-04-29 MED ORDER — HYDRALAZINE HCL 50 MG PO TABS
50.0000 mg | ORAL_TABLET | Freq: Three times a day (TID) | ORAL | Status: DC
  Administered 2015-04-29: 50 mg via ORAL
  Filled 2015-04-29 (×2): qty 1

## 2015-04-29 MED ORDER — DEXTROSE 50 % IV SOLN: 25.0000 mL | Freq: Once | INTRAVENOUS | Status: DC

## 2015-04-29 MED ORDER — POTASSIUM CHLORIDE 10 MEQ/100ML IV SOLN: 10.0000 meq | INTRAVENOUS | Status: DC

## 2015-04-29 MED ORDER — POTASSIUM CHLORIDE 10 MEQ/100ML IV SOLN
10.0000 meq | Freq: Once | INTRAVENOUS | Status: DC
  Filled 2015-04-29: qty 100

## 2015-04-29 MED ORDER — DEXTROSE 50 % IV SOLN
1.0000 | Freq: Once | INTRAVENOUS | Status: AC
  Administered 2015-04-29: 50 mL via INTRAVENOUS

## 2015-04-29 MED ORDER — POTASSIUM CHLORIDE CRYS ER 20 MEQ PO TBCR
40.0000 meq | EXTENDED_RELEASE_TABLET | Freq: Once | ORAL | Status: AC
  Administered 2015-04-29: 40 meq via ORAL
  Filled 2015-04-29: qty 2

## 2015-04-29 MED ORDER — DEXTROSE 50 % IV SOLN
25.0000 mL | Freq: Once | INTRAVENOUS | Status: AC
  Administered 2015-04-29: 25 mL via INTRAVENOUS
  Filled 2015-04-29: qty 50

## 2015-04-29 NOTE — H&P (Signed)
[facility] Admission History and Physical       Jeiden Daughtridge UJW:[id number] DOB: 03/09/1980 DOA: 04/29/2015  Referring physician: EDP PCP: Jeanann Lewandowsky, MD  Specialists:   Chief Complaint: Low Blood Sugar  HPI: Adonus Uselman is a 35 y.o. male with a history of Uncontrolled DM2, Uncontrolled HTN, and CKD Stage III who presents to the ED after being seen at the [facility] and was found to have hyperglycemia into the 400's and was given 20 units of Insulin and sent to the ED.   In the ED he was evaluated and began to have hypoglycemia and despite several rounds of IV Dextrose his glucose l;evels continued to trend down and he was referred for medical admission.   He was also found to have a blood pressure of 220/113.   He was administered IV hydralazine in the ED.       Review of Systems: Constitutional: No Weight Loss, No Weight Gain, Night Sweats, Fevers, Chills, Dizziness, Light Headedness, Fatigue, or Generalized Weakness HEENT: No Headaches, Difficulty Swallowing,Tooth/Dental Problems,Sore Throat,  No Sneezing, Rhinitis, Ear Ache, Nasal Congestion, or Post Nasal Drip,  Cardio-vascular:  No Chest pain, Orthopnea, PND, Edema in Lower Extremities, Anasarca, Dizziness, Palpitations  Resp: No Dyspnea, No DOE, No Productive Cough, No Non-Productive Cough, No Hemoptysis, No Wheezing.    GI: No Heartburn, Indigestion, Abdominal Pain, Nausea, Vomiting, Diarrhea, Constipation, Hematemesis, Hematochezia, Melena, Change in Bowel Habits,  Loss of Appetite  GU: No Dysuria, No Change in Color of Urine, No Urgency or Urinary Frequency, No Flank pain.  Musculoskeletal: No Joint Pain or Swelling, No Decreased Range of Motion, No Back Pain.  Neurologic: No Syncope, No Seizures, Muscle Weakness, Paresthesia, Vision Disturbance or Loss, No Diplopia, No Vertigo, No Difficulty Walking,  Skin: No Rash or Lesions. Psych: No Change in Mood or Affect, No Depression  or Anxiety, No Memory loss, No Confusion, or Hallucinations   Past Medical History  Diagnosis Date  . Hypertension   . Acute renal failure   . Bacteremia     MSSA   . Diabetes type 2, uncontrolled 01/09/2015  . 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 Essential hypertension, benign 01/18/2014  . Infective myositis of left thigh   . Noncompliance 01/18/2014  . Septic shock   . Staphylococcus aureus bacteremia with sepsis   . Tobacco abuse 01/15/2014  . Mitral regurgitation 01/13/2015    Severe by TEE  . MSSA (methicillin susceptible Staphylococcus aureus)   . Type II diabetes mellitus     not controlled  . Diabetic foot infection 01/15/2014  . S/P minimally-invasive mitral valve repair 02/18/2015    Complex valvuloplasty including bovine pericardial patch repair of perforation of posterior leaflet with 28 mm Sorin Memo 3D ring annuloplasty via right mini thoracotomy approach      Past Surgical History  Procedure Laterality Date  . I&d extremity Bilateral 01/16/2014    Procedure:  DEBRIDEMENT of bilateral foot ulcers;  Surgeon: Kathryne Hitch, MD;  [facility];  Service: Orthopedics;  Laterality: Bilateral;  . Tee without cardioversion N/A 01/13/2015    Procedure: TRANSESOPHAGEAL ECHOCARDIOGRAM (TEE);  Surgeon: Thurmon Fair, MD;  Location: [facility] ENDOSCOPY;  Service: Cardiovascular;  Laterality: N/A;  . Left and right heart catheterization with coronary angiogram N/A 01/24/2015    Procedure: LEFT AND RIGHT HEART CATHETERIZATION WITH CORONARY ANGIOGRAM;  Surgeon: Peter M Swaziland, MD;  Location: [facility] CATH LAB;  Service: Cardiovascular;  Laterality: N/A;  . Multiple extractions with alveoloplasty N/A  01/25/2015    Procedure: Extraction of tooth #'s 1,2,3,4,5,6,8,9,11,12,13,14,15,16,17,18,19 28,31,32 with alveoloplasty and gross debridement of remaining teeth.;  Surgeon: Charlynne Pander, DDS;  Location: [facility] OR;  Service: Oral Surgery;  Laterality: N/A;  . Cardiac catheterization  12/2014    . Shoulder arthroscopy Right 02/05/2015    Procedure: ARTHROSCOPY SHOULDER;  Surgeon: Nadara Mustard, MD;  Location: [facility] OR;  Service: Orthopedics;  Laterality: Right;  . Mitral valve replacement Right 02/18/2015    Procedure: MINIMALLY INVASIVE MITRAL VALVE (MV) REPAIR;  Surgeon: Purcell Nails, MD;  Location: [facility] OR;  Service: Open Heart Surgery;  Laterality: Right;  . Tee without cardioversion N/A 02/18/2015    Procedure: TRANSESOPHAGEAL ECHOCARDIOGRAM (TEE);  Surgeon: Purcell Nails, MD;  Location: [facility] OR;  Service: Open Heart Surgery;  Laterality: N/A;      Prior to Admission medications   Medication Sig Start Date End Date Taking? Authorizing Provider  cloNIDine (CATAPRES) 0.3 MG tablet Take 1 tablet (0.3 mg total) by mouth 3 (three) times daily. 03/02/15  Yes Erin R Barrett, PA-C  furosemide (LASIX) 40 MG tablet Take 1 tablet (40 mg total) by mouth 2 (two) times daily. 04/26/15  Yes Cliffton Asters, MD  hydrALAZINE (APRESOLINE) 50 MG tablet Take 1 tablet (50 mg total) by mouth 3 (three) times daily. 03/17/15  Yes Beatrice Lecher, PA-C  insulin aspart protamine- aspart (NOVOLOG MIX 70/30) (70-30) 100 UNIT/ML injection Inject 0.14 mLs (14 Units total) into the skin 2 (two) times daily with a meal. 01/28/15  Yes [name], DO  metoprolol tartrate (LOPRESSOR) 25 MG tablet Take 3 tablets (75 mg total) by mouth 2 (two) times daily. 03/02/15  Yes Erin R Barrett, PA-C  potassium chloride SA (K-DUR,KLOR-CON) 20 MEQ tablet Take 2 tablets daily 03/31/15  Yes Dwana Melena, PA-C  warfarin (COUMADIN) 5 MG tablet Take 1 tablet (5 mg total) by mouth daily at 6 PM. 04/14/15  Yes Brittainy Sherlynn Carbon, PA-C  aspirin EC 325 MG EC tablet Take 1 tablet (325 mg total) by mouth daily. Patient not taking: Reported on 04/26/2015 02/25/15   Ardelle Balls, PA-C  cyclobenzaprine (FLEXERIL) 10 MG tablet Take 1 tablet (10 mg total) by mouth 2 (two) times daily as needed for muscle spasms. Patient not taking: Reported on  04/26/2015 04/15/15   [name], PA-C  Insulin Syringe-Needle U-100 28G X 1/2" 0.5 ML MISC Use as directed. Any generic available. 01/28/15   [name], DO  Lancets (FREESTYLE) lancets Any generic available. Use as instructed 01/28/15   [name], DO  metroNIDAZOLE (FLAGYL) 500 MG tablet Take 1 tablet (500 mg total) by mouth 3 (three) times daily. Patient not taking: Reported on 04/29/2015 04/06/15   Jaclyn Shaggy, MD  oxyCODONE (OXY IR/ROXICODONE) 5 MG immediate release tablet Take 1 tablet (5 mg total) by mouth every 4 (four) hours as needed for moderate pain or severe pain. Patient not taking: Reported on 04/06/2015 02/25/15   Ardelle Balls, PA-C  oxyCODONE-acetaminophen (PERCOCET/ROXICET) 5-325 MG per tablet Take 1-2 tablets by mouth every 4 (four) hours as needed for severe pain. Patient not taking: Reported on 04/29/2015 04/15/15   [name], PA-C  warfarin (COUMADIN) 5 MG tablet Take 1 tablet (5 mg total) by mouth daily. Patient not taking: Reported on 04/29/2015 04/15/15   [name], PA-C     No Known Allergies  Social History:  reports that he quit smoking about 3 months ago. His smoking use included Cigars. He has never  used smokeless tobacco. He reports that he does not drink alcohol or use illicit drugs.    Family History  Problem Relation Age of Onset  . Heart attack Neg Hx   . Stroke Maternal Uncle   . Hypertension Mother   . Diabetes Brother        Physical Exam:  GEN:  Pleasant Well Nourished and Well Developed  35 y.o. African American male examined and in no acute distress; cooperative with exam Filed Vitals:   04/29/15 2015 04/29/15 2045 04/29/15 2100 04/29/15 2131  BP: 178/100 183/109 171/116 177/113  Pulse: 74 77 76 78  Temp:      TempSrc:      Resp: 5 21 6 19   SpO2: 100% 100% 100% 100%   Blood pressure 177/113, pulse 78, temperature 97.8 F (36.6 C), temperature source Oral, resp. rate 19, SpO2 100 %. PSYCH: He is alert and oriented x4;  does not appear anxious does not appear depressed; affect is normal HEENT: Normocephalic and Atraumatic, Mucous membranes pink; PERRLA; EOM intact; Fundi:  Benign;  No scleral icterus, Nares: Patent, Oropharynx: Clear, Fair Dentition,    Neck:  FROM, No Cervical Lymphadenopathy nor Thyromegaly or Carotid Bruit; No JVD; Breasts:: Not examined CHEST WALL: No tenderness CHEST: Normal respiration, clear to auscultation bilaterally HEART: Regular rate and rhythm; no murmurs rubs or gallops BACK: No kyphosis or scoliosis; No CVA tenderness ABDOMEN: Positive Bowel Sounds, Soft Non-Tender, No Rebound or Guarding; No Masses, No Organomegaly Rectal Exam: Not done EXTREMITIES: No Cyanosis, Clubbing, or Edema; No Ulcerations. Genitalia: not examined PULSES: 2+ and symmetric SKIN: Normal hydration no rash or ulceration CNS:  Alert and Oriented x 4, No Focal Deficits Vascular: pulses palpable throughout    Labs on Admission:  Basic Metabolic Panel:  Recent Labs Lab 04/29/15 1710  NA 142  K 2.8*  CL 108  CO2 27  GLUCOSE 94  BUN 18  CREATININE 2.02*  CALCIUM 8.5   Liver Function Tests: No results for input(s): AST, ALT, ALKPHOS, BILITOT, PROT, ALBUMIN in the last 168 hours. No results for input(s): LIPASE, AMYLASE in the last 168 hours. No results for input(s): AMMONIA in the last 168 hours. CBC:  Recent Labs Lab 04/29/15 1710  WBC 4.7  NEUTROABS 2.8  HGB 10.7*  HCT 32.0*  MCV 82.1  PLT 214   Cardiac Enzymes: No results for input(s): CKTOTAL, CKMB, CKMBINDEX, TROPONINI in the last 168 hours.  BNP (last 3 results)  Recent Labs  01/22/15 0347 02/02/15 1035 02/11/15 0510  BNP 218.9* 629.0* 182.2*    ProBNP (last 3 results) No results for input(s): PROBNP in the last 8760 hours.  CBG:  Recent Labs Lab 04/29/15 1709 04/29/15 1757 04/29/15 1858 04/29/15 2022 04/29/15 2116  GLUCAP 85 40* 72 86 68*    Radiological Exams on Admission: No results found.   EKG:  Independently reviewed. Normal Sinus Rhythm Rate =60, No acute S-T changes , Previous AnteroSeptal Infarct Changes   Assessment/Plan:   35 y.o. male with  Principal Problem:   1.   Hypoglycemia   IV D5 1/2 NSS with KCl started   Monitor Glucose Levels hourly   Follow Hypoglycemic Protocol    Active Problems:   2.   Hypertensive urgency/ Uncontrolled hypertension   IV Hydralazine   Resume Clonidine, Lopressor, Hydralazine, Lasix.       3.   Hypokalemia   Replace K+   Change Magnesium Level     4.   Chronic Diastolic CHF (congestive  heart failure), NYHA class 3    5.   Diabetes type 2, uncontrolled-    Hold Insulin until Glucose Stablizes     6.   Chronic kidney disease (CKD), stage III (moderate)       7.   DVT Prophylaxis   On Coumadin           Code Status:     FULL CODE      Family Communication:   No Family Present    Disposition Plan:    Inpatient Status        Time spent:  3060 70 Minutes      Ron ParkerJENKINS,Kele Withem C [facility] Pager [phone]   If 7AM -7PM Please Contact the Day Rounding Team MD for [facility]  If 7PM-7AM, Please Contact Night-Floor Coverage  [email or web address] Password [email or web address] 04/29/2015, 10:20 PM     ADDENDUM:   Patient was seen and examined on 04/29/2015

## 2015-04-29 NOTE — ED Notes (Signed)
CBG 86. 

## 2015-04-29 NOTE — ED Provider Notes (Addendum)
Medical screening examination/treatment/procedure(s) were conducted as a shared visit with non-physician practitioner(s) and myself.  I personally evaluated the patient during the encounter.   EKG Interpretation   Date/Time:  Friday April 29 2015 16:16:40 EDT Ventricular Rate:  60 PR Interval:  171 QRS Duration: 87 QT Interval:  432 QTC Calculation: 432 R Axis:   -9 Text Interpretation:  Sinus rhythm Ventricular trigeminy , new since last  tracing Probable anteroseptal infarct, old Since last tracing rate slower  Confirmed by Chandlar Guice  MD-J, Ezra Denne ([id number]) on 04/29/2015 4:23:21 PM      Pt sent from the urgent care due to hyperglycemia.  Pt states he was there for a routine visit.  He did not take his insulin today because he had not eaten anything.  He then ate something before the visit but still had not taken his insulin yet.  Denies cp, sob, n/v/d.  History of dm, CHF.   Also history of septic arthritis and septic emboli.   Will check labs, electrolytes and monitor.  On exam, pt appears comfortable.  Pedal edema noted on exam.  Lungs CTA.  Car RRR  Linwood DibblesJon Kearia Yin, MD 04/29/15 1646  Notes indicate pt was given 20 units of novolog at the UC.  This resulted in hypoglycemia in the ED.  Pt was given d50.  He was given a meal.  Will will monitor his sugar for at least a few hours.  Linwood DibblesJon Legacie Dillingham, MD 04/29/15 2025  We asked to have patient admitted because blood sugar continued to drop despite d50 and food.  BP was also persistently elevated.  Pt became upset because his IV started to hurt him and the initial one he had was pulled.  They were unable to get additional access.  We consulted for admission but pt does not want to say.  BP at the bedside is back in the 170s.  K is low at 2.8 and his blood sugar at least is increasing.  Pt understands risks.  He has spoken to his wife.  Will leave ama  Linwood DibblesJon Jessamy Torosyan, MD 04/29/15 2316

## 2015-04-29 NOTE — Progress Notes (Signed)
Patient presented to clinic with "weakness in his legs". Patient complaining of weakness in bilateral legs. He indicates problem started today around 1000.  He indicates he was on bus trying to get to appointment with Dr. Orpah CobbAdvani when he began having bilateral leg weakness. Patient also indicates he has had difficulty seeing out of left eye x 1 month. This was discussed at last appointment.  Patient discussed thoroughly with Concepcion LivingLinda Bernhardt, NP. Patient also discussed with Dr. Venetia NightAmao who both examined patient. Patient has multiple co-morbidities including type 2 diabetes mellitus, recent treatment for MSSA mitral valve infective endocarditis, congestive heart failure, chronic renal insufficiency. CBG checked-408   20 U of Novolog given per protocol.  Urine dipstick checked in addition per protocol.   Speech slow but consistent with previous exam per Dr. Maye HidesAmao Lauren Ducatte, RN attempted to started PIV but was unsuccessful.  After examination by Concepcion LivingLinda Bernhardt, NP and Dr. Venetia NightAmao it was determined patient should be sent to ED by non-emergent EMS for evaluation due to symptoms and complex medical history.  EMS called.  Report called to ED RN.  Repeat CBG check-267. Ambulatory transfer summary given to EMS and hand-off given to EMS.

## 2015-04-29 NOTE — ED Notes (Signed)
CBG 72  

## 2015-04-29 NOTE — ED Provider Notes (Signed)
CSN: [id number]     Arrival date & time 04/29/15  1606 History   None    Chief Complaint  Patient presents with  . Hyperglycemia   HPI   35 year old male presents today with hyperglycemia. Patient was seen at urgent care for hyperglycemia, with initial complaint of increased lower extremity weakness. CBG was checked with the reading of 408, he was given 20 units of NovoLog transferred to the emergency room. Upon arrival CBG in the 200s, patient reported bilateral lower extremity weakness with no focal neurological deficits. He also reports an increase in his baseline lower extremity edema, states that this comes and goes worse with ambulation; reports that he's been having to walk more recently. Patient also reports a month long history of decreased central vision with, and lateral blurry vision. Patient reports that he's been seen for this and is being followed. Patient denies headache, nausea, vomiting, chest pain, shortness of breath, orthopnea, abdominal pain. He reports urinary frequency, denies odor, dysuria.  Past Medical History  Diagnosis Date  . Hypertension   . Acute renal failure   . Bacteremia     MSSA   . Diabetes type 2, uncontrolled 01/09/2015  . 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 Essential hypertension, benign 01/18/2014  . Infective myositis of left thigh   . Noncompliance 01/18/2014  . Septic shock   . Staphylococcus aureus bacteremia with sepsis   . Tobacco abuse 01/15/2014  . Mitral regurgitation 01/13/2015    Severe by TEE  . MSSA (methicillin susceptible Staphylococcus aureus)   . Type II diabetes mellitus     not controlled  . Diabetic foot infection 01/15/2014  . 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 I&d  extremity Bilateral 01/16/2014    Procedure:  DEBRIDEMENT of bilateral foot ulcers;  Surgeon: Kathryne Hitch, MD;  Location: [facility] OR;  Service: Orthopedics;  Laterality: Bilateral;  . Tee without cardioversion N/A 01/13/2015    Procedure: TRANSESOPHAGEAL ECHOCARDIOGRAM (TEE);  Surgeon: Thurmon Fair, MD;  Location: [facility] ENDOSCOPY;  Service: Cardiovascular;  Laterality: N/A;  . Left and right heart catheterization with coronary angiogram N/A 01/24/2015    Procedure: LEFT AND RIGHT HEART CATHETERIZATION WITH CORONARY ANGIOGRAM;  Surgeon: Peter M Swaziland, MD;  Location: [facility] CATH LAB;  Service: Cardiovascular;  Laterality: N/A;  . Multiple extractions with alveoloplasty N/A 01/25/2015    Procedure: Extraction of tooth #'s 1,2,3,4,5,6,8,9,11,12,13,14,15,16,17,18,19 28,31,32 with alveoloplasty and gross debridement of remaining teeth.;  Surgeon: Charlynne Pander, DDS;  Location: [facility] OR;  Service: Oral Surgery;  Laterality: N/A;  . Cardiac catheterization  12/2014  . Shoulder arthroscopy Right 02/05/2015    Procedure: ARTHROSCOPY SHOULDER;  Surgeon: Nadara Mustard, MD;  Location: [facility] OR;  Service: Orthopedics;  Laterality: Right;  . Mitral valve replacement Right 02/18/2015    Procedure: MINIMALLY INVASIVE MITRAL VALVE (MV) REPAIR;  Surgeon: Purcell Nails, MD;  Location: [facility] OR;  Service: Open Heart Surgery;  Laterality: Right;  . Tee without cardioversion N/A 02/18/2015    Procedure: TRANSESOPHAGEAL ECHOCARDIOGRAM (TEE);  Surgeon: Purcell Nails, MD;  Location: [facility] OR;  Service: Open Heart Surgery;  Laterality: N/A;   Family History  Problem Relation Age of Onset  . Heart attack Neg Hx   . Stroke Maternal Uncle   .  Hypertension Mother   . Diabetes Brother    History  Substance Use Topics  . Smoking status: Former Smoker -- 12 years    Types: Cigars    Quit date: 01/09/2015  . Smokeless tobacco: Never Used  . Alcohol Use: No    Review of Systems  All other systems reviewed and are  negative.   Allergies  Review of patient's allergies indicates no known allergies.  Home Medications   Prior to Admission medications   Medication Sig Start Date End Date Taking? Authorizing Provider  aspirin EC 325 MG EC tablet Take 1 tablet (325 mg total) by mouth daily. Patient not taking: Reported on 04/26/2015 02/25/15   Ardelle Ballsonielle M Zimmerman, PA-C  cloNIDine (CATAPRES) 0.3 MG tablet Take 1 tablet (0.3 mg total) by mouth 3 (three) times daily. 03/02/15   Erin R Barrett, PA-C  cyclobenzaprine (FLEXERIL) 10 MG tablet Take 1 tablet (10 mg total) by mouth 2 (two) times daily as needed for muscle spasms. Patient not taking: Reported on 04/26/2015 04/15/15   Elpidio AnisShari Upstill, PA-C  furosemide (LASIX) 40 MG tablet Take 1 tablet (40 mg total) by mouth 2 (two) times daily. 04/26/15   Cliffton AstersJohn Campbell, MD  hydrALAZINE (APRESOLINE) 50 MG tablet Take 1 tablet (50 mg total) by mouth 3 (three) times daily. 03/17/15   Beatrice LecherScott T Weaver, PA-C  insulin aspart protamine- aspart (NOVOLOG MIX 70/30) (70-30) 100 UNIT/ML injection Inject 0.14 mLs (14 Units total) into the skin 2 (two) times daily with a meal. 01/28/15   [name], DO  Insulin Syringe-Needle U-100 28G X 1/2" 0.5 ML MISC Use as directed. Any generic available. 01/28/15   [name], DO  Lancets (FREESTYLE) lancets Any generic available. Use as instructed 01/28/15   [name], DO  metoprolol tartrate (LOPRESSOR) 25 MG tablet Take 3 tablets (75 mg total) by mouth 2 (two) times daily. 03/02/15   Erin R Barrett, PA-C  metroNIDAZOLE (FLAGYL) 500 MG tablet Take 1 tablet (500 mg total) by mouth 3 (three) times daily. Patient not taking: Reported on 04/29/2015 04/06/15   Jaclyn ShaggyEnobong Amao, MD  oxyCODONE (OXY IR/ROXICODONE) 5 MG immediate release tablet Take 1 tablet (5 mg total) by mouth every 4 (four) hours as needed for moderate pain or severe pain. Patient not taking: Reported on 04/06/2015 02/25/15   Ardelle Ballsonielle M Zimmerman, PA-C  oxyCODONE-acetaminophen  (PERCOCET/ROXICET) 5-325 MG per tablet Take 1-2 tablets by mouth every 4 (four) hours as needed for severe pain. Patient not taking: Reported on 04/29/2015 04/15/15   Elpidio AnisShari Upstill, PA-C  potassium chloride SA (K-DUR,KLOR-CON) 20 MEQ tablet Take 2 tablets daily 03/31/15   Dwana MelenaBryan W Hager, PA-C  warfarin (COUMADIN) 5 MG tablet Take 1 tablet (5 mg total) by mouth daily at 6 PM. 04/14/15   Brittainy Sherlynn CarbonM Simmons, PA-C  warfarin (COUMADIN) 5 MG tablet Take 1 tablet (5 mg total) by mouth daily. 04/15/15   Shari Upstill, PA-C   BP 210/125 mmHg  Pulse 78  Temp(Src) 97.8 F (36.6 C) (Oral)  Resp 21  SpO2 100% Physical Exam  Constitutional: He is oriented to person, place, and time. He appears well-developed and well-nourished.  HENT:  Head: Normocephalic and atraumatic.  Eyes: EOM are normal. Pupils are equal, round, and reactive to light.  Patient reports central vision loss on the left with peripheral vision on the left diminished  Neck: Normal range of motion. Neck supple. No JVD present. No tracheal deviation present. No thyromegaly present.  Cardiovascular: Normal rate, regular rhythm, normal heart  sounds and intact distal pulses.  Exam reveals no gallop and no friction rub.   No murmur heard. Pulmonary/Chest: Effort normal and breath sounds normal. No stridor. No respiratory distress. He has no wheezes. He has no rales. He exhibits no tenderness.  Abdominal: Soft. There is no tenderness. There is no rebound and no guarding.  Musculoskeletal: Normal range of motion. He exhibits edema.  Lymphadenopathy:    He has no cervical adenopathy.  Neurological: He is alert and oriented to person, place, and time. No cranial nerve deficit. Coordination normal.  Skin: Skin is warm and dry.  Psychiatric: He has a normal mood and affect. His behavior is normal. Judgment and thought content normal.  Nursing note and vitals reviewed.   ED Course  Procedures (including critical care time) Labs Review Labs  Reviewed - No data to display  Imaging Review No results found.   EKG Interpretation   Date/Time:  Friday April 29 2015 16:16:40 EDT Ventricular Rate:  60 PR Interval:  171 QRS Duration: 87 QT Interval:  432 QTC Calculation: 432 R Axis:   -9 Text Interpretation:  Sinus rhythm Ventricular trigeminy , new since last  tracing Probable anteroseptal infarct, old Since last tracing rate slower  Confirmed by KNAPP  MD-J, JON ([id number]) on 04/29/2015 4:23:21 PM      MDM   Final diagnoses:  Hypoglycemia    Labs: CBC, BMP Ongoing CBG monitoring- 408 at office visit, 287 prior to departure from office, 85 upon arrival in the ED, 40, 86, 68, 164  Imaging: None indicated  Consults: Hospitalist Dr. Lovell Sheehan  Therapeutics: Normal saline 500 mL, D50 1 ampule x2, sandwich and juice,  hydralazine, potassium chloride IV, potassium chloride oral  Assessment: Hyperglycemia   Plan: Patient was transferred to the ED for hyperglycemia with initial reading of 400. He was administered 20 units of NovoLog with repeat reading of 287. He was then transferred to our facility for CBG showed 87. Patient's blood sugar continued to fall with the reading of 40, he was diaphoretic at that time given one ampule of D50, sandwich and juice. His blood sugar rose to 86, shortly after falling into a 68. Patient's blood pressure was found to be elevated as well he was given his home therapy of hydralazine and clonidine. Because of patient's unsteady CBG with repeated dextrose administration and food the hospitalist service was consult and for hospital admission. Patient complained of pain to the site of IV dextrose administration, no signs of titration or localized reaction. Nursing staff removed the IV for patient's comfort at his request. Attempts to establish another IV were unsuccessful with the assistance of ultrasound guided procedures. Patient's wife was on the phone during my consultation with the patient both her and  the patient requested that we discontinue attempts for IV access. I stressed the importance of gaining access and hospital admission for management of his unsteady CBG readings, hypokalemia, and blood pressure readings. Both the patient and the wife adamantly refused further management, and reported that he would not state the hospital tonight. Multiple attempts were made to inform the patient of the potential life-threatening consequences of not staying in the hospital including death. Both the patient and his wife understood these consequences and continued to refuse further interventions and hospital admission. The patient was conscious alert and oriented and competent to make his decisions regarding his healthcare. I explained to the patient that he needed to closely monitor his CBG readings, and blood pressure, and return immediately to the  emergency room if any concerning signs or symptoms presented. He is encouraged to follow up with his previously scheduled appointment Monday at [facility] health and wellness. Patient reports that he does have all his medication and needles for administration at home and would be closely monitoring his blood sugar. Again one last attempt at [facility] admission was made, patient refused. Patient understood and agreed to leaving AMA.      Eyvonne Mechanic, PA-C 04/30/15 [phone]

## 2015-04-29 NOTE — ED Notes (Signed)
Pt decide to leave the hospital without medical advice, MD oriented the pt of his risk of going without medical advice, pt still states he is leaving the hospital.

## 2015-04-29 NOTE — ED Notes (Signed)
Pt very diaphoretic and states he feels his sugar is dropping. Insulin was given to pt at PCP office. CBG of 40. D50 ordered and orange juice given to patient.

## 2015-04-29 NOTE — Progress Notes (Signed)
Subjective:     Patient ID: Dwayne [name], male   DOB: 07/22/1980, 35 y.o.   MRN: [id number]  HPI   Patient with multiple chronic illness presents after having missed an appointment to reestablish care this morning with Dr. Venida JarvisAvani. He has been fatigued for a while. When he got on the bus today, he noticed his legs were really weak and he had trouble lifting his legs. This was bilateral. He did not feel like he was going to pass out. When he arrived here, it was noticed that his speech was slow and he was having trouble maintaining his balance to walk. He was brought back to the room in a wheel chair. He has an extensive medical history including Diabetes, Stage 3 diastolic heart failure, hypertension, orthostatic hypotension. CKD. Mitral regurg., bacterial endocardtis, among others.His urine specific gravity is 1.030 but he has pedal edema. His Lasix was increased on 04/26/15 due to BP of 193/137 but he has not started that. He admits that he overdid it yesterday afternoon and evening. His ejection fraction is 65%. Hia last potassium a few days ago was 2.9.    Review of Systems   He denies chest pain other than the normal soreness on the right related to surgery. He denies shortness of breath or dizziness. He denies any unilateral weakness .    Objective:   Physical Exam   He is alert, oriented and appropriate. His speech is slow but consistent with las visit according to Dr. Venetia NightAmao who saw him last. Neuro is non-focal. He has FROM of his extremeties but still is very weak when he stands. His lungs are clear to auscultation, HS are irregular, with occassioal skipped beat.       Assessment:     Weakness of legs in patient with multiple complicated health problems.    Plan:     Are sending him to ED as we do not have the where with all to assess and determine the cause of his symptoms here this afternoon. We attempted unsuccessfully to start an IV.  The determination to send to the ED was  made with input of Dr. Venetia NightAmao who saw him last.   Henrietta HooverLinda C. Trica Usery, FNP-BC

## 2015-04-29 NOTE — ED Notes (Signed)
Patient is from Home. Went to PCP for follow-up and had some weakness. CBG 244 with EMS. No visual field on Lt side. Vitals 162/94 HR 84 R20 Sats 98 RA

## 2015-05-02 NOTE — Telephone Encounter (Signed)
Nurse spoke with patient in office April 29, 2015 about missing ophthalmology appointment. Patient is aware of nurses many attempts via telephone, nurse left messages. Patient reports getting messages. Nurse explained to patient the importance of returning call to nurse. Harvin HazelJamilla, office manager, asked nurse to see if patient would go to ophthalmologist if they agree to see him again. Patient agrees to go but does not have transportation. Jamilla aware of transportation issue. Jamilla in progress of setting up appointment for patient. Nurse advised patient nurse would call when appointment is made. Patient agrees to return nurses call to find out about appointment if nurse has to leave message due to patient not available when nurse calls.

## 2015-05-05 ENCOUNTER — Inpatient Hospital Stay: Payer: Self-pay | Admitting: Family Medicine

## 2015-05-05 ENCOUNTER — Ambulatory Visit: Payer: Medicaid Other | Attending: Family Medicine | Admitting: Family Medicine

## 2015-05-05 ENCOUNTER — Telehealth: Payer: Self-pay

## 2015-05-05 ENCOUNTER — Encounter: Payer: Self-pay | Admitting: Family Medicine

## 2015-05-05 VITALS — BP 168/112 | HR 82 | Temp 98.1°F | Resp 18 | Ht 74.0 in | Wt 233.0 lb

## 2015-05-05 DIAGNOSIS — M79601 Pain in right arm: Secondary | ICD-10-CM | POA: Insufficient documentation

## 2015-05-05 DIAGNOSIS — Z8631 Personal history of diabetic foot ulcer: Secondary | ICD-10-CM | POA: Diagnosis not present

## 2015-05-05 DIAGNOSIS — E0829 Diabetes mellitus due to underlying condition with other diabetic kidney complication: Secondary | ICD-10-CM | POA: Diagnosis not present

## 2015-05-05 DIAGNOSIS — E876 Hypokalemia: Secondary | ICD-10-CM

## 2015-05-05 DIAGNOSIS — F17200 Nicotine dependence, unspecified, uncomplicated: Secondary | ICD-10-CM | POA: Insufficient documentation

## 2015-05-05 DIAGNOSIS — I509 Heart failure, unspecified: Secondary | ICD-10-CM | POA: Insufficient documentation

## 2015-05-05 DIAGNOSIS — T447X6A Underdosing of beta-adrenoreceptor antagonists, initial encounter: Secondary | ICD-10-CM | POA: Insufficient documentation

## 2015-05-05 DIAGNOSIS — Z953 Presence of xenogenic heart valve: Secondary | ICD-10-CM | POA: Insufficient documentation

## 2015-05-05 DIAGNOSIS — E1165 Type 2 diabetes mellitus with hyperglycemia: Secondary | ICD-10-CM | POA: Insufficient documentation

## 2015-05-05 DIAGNOSIS — I5031 Acute diastolic (congestive) heart failure: Secondary | ICD-10-CM | POA: Diagnosis not present

## 2015-05-05 DIAGNOSIS — N183 Chronic kidney disease, stage 3 unspecified: Secondary | ICD-10-CM

## 2015-05-05 DIAGNOSIS — N179 Acute kidney failure, unspecified: Secondary | ICD-10-CM | POA: Insufficient documentation

## 2015-05-05 DIAGNOSIS — I10 Essential (primary) hypertension: Secondary | ICD-10-CM | POA: Insufficient documentation

## 2015-05-05 DIAGNOSIS — Z794 Long term (current) use of insulin: Secondary | ICD-10-CM | POA: Insufficient documentation

## 2015-05-05 DIAGNOSIS — Z9889 Other specified postprocedural states: Secondary | ICD-10-CM

## 2015-05-05 DIAGNOSIS — Z7901 Long term (current) use of anticoagulants: Secondary | ICD-10-CM | POA: Insufficient documentation

## 2015-05-05 DIAGNOSIS — N189 Chronic kidney disease, unspecified: Secondary | ICD-10-CM

## 2015-05-05 DIAGNOSIS — H547 Unspecified visual loss: Secondary | ICD-10-CM | POA: Insufficient documentation

## 2015-05-05 DIAGNOSIS — R400 Somnolence: Secondary | ICD-10-CM

## 2015-05-05 DIAGNOSIS — Z91199 Patient's noncompliance with other medical treatment and regimen due to unspecified reason: Secondary | ICD-10-CM

## 2015-05-05 DIAGNOSIS — M7989 Other specified soft tissue disorders: Secondary | ICD-10-CM | POA: Insufficient documentation

## 2015-05-05 DIAGNOSIS — Z9119 Patient's noncompliance with other medical treatment and regimen: Secondary | ICD-10-CM

## 2015-05-05 LAB — GLUCOSE, POCT (MANUAL RESULT ENTRY): POC GLUCOSE: 230 mg/dL — AB (ref 70–99)

## 2015-05-05 MED ORDER — POTASSIUM CHLORIDE CRYS ER 20 MEQ PO TBCR: EXTENDED_RELEASE_TABLET | ORAL | Status: DC

## 2015-05-05 MED ORDER — METOPROLOL TARTRATE 25 MG PO TABS: 75.0000 mg | ORAL_TABLET | Freq: Two times a day (BID) | ORAL | Status: DC

## 2015-05-05 MED ORDER — INSULIN GLARGINE 100 UNIT/ML SOLOSTAR PEN: 5.0000 [IU] | PEN_INJECTOR | Freq: Every day | SUBCUTANEOUS | Status: DC

## 2015-05-05 NOTE — Progress Notes (Signed)
Quick Note:  Labs addressed at office as well as medications and patient was made aware. ______ 

## 2015-05-05 NOTE — Telephone Encounter (Signed)
Nurse called patient, reached voicemail. Left message for patient to call Saranya Harlin at [phone]. Patient needs BMP next week per Dr. Venetia NightAmao.

## 2015-05-05 NOTE — Patient Instructions (Signed)

## 2015-05-05 NOTE — Progress Notes (Signed)
Patient came to clinic, was sent to ED. Patient was complaining of lower extremity weakness. Patients right arm is currently swollen from IV. Patient reports pain right arm, at level 2, described as sore. Patient needs refill on warfarin and metoprolol.

## 2015-05-05 NOTE — Progress Notes (Signed)
Subjective:    Patient ID: Dwayne [name], male    DOB: 09/03/1980, 35 y.o.   MRN: [id number]  HPI  Dwayne HickmanBenjamin Bussa was seen by the NP at his last clinic visit and referred to the ED due to complaints of weakness in lower extremities, difficulty walking and hyperglycemia of 408 for which he had received 20 units of insulin and after unsuccessful attempts at inserting an IV. On presentation at at the ED his sugars had dropped to 40 but rose back to 86 after one amp of D50, sandwich and juice; he was found to be hypokalemic at 2.8, blood pressure was severely elevated at 220/113 and he received oral potassium supplements, hydralazine and clonidine but there were unsuccessful attempts at securing an IV access even with ultrasound guidance but the patient complained of pain at the site and refused hospitalization and was discharged AMA.   interval history:   States he no longer has weakness of his lower extremities and admitted to the fact that he had lost his Lasix pills which he just found and started taking it with resulting improvement in symptoms. He complains of right arm pain and swelling which  He had from his ED presentation at the site of IV insertion and has improved and is now at 2 out of 10. Denies fever.   I reviewed his medications and he apparently is not taking his  Metoprolol and is unsure of his other antihypertensives. Initially told me he took the blood pressure pill which she is supposed to take 3 times a day but he is supposed to actually be taking  Both hydralazine and clonidine 3 times a day ; he corrects himself when I pointed this out.  He lives with his mom but he is in charge of his medications and does not seem competent of doing so as he is really unsure of what he takes.  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 Hypertension   . Acute renal failure   . Bacteremia     MSSA   . Diabetes type 2, uncontrolled 01/09/2015    . 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 Essential hypertension, benign 01/18/2014  . Infective myositis of left thigh   . Noncompliance 01/18/2014  . Septic shock   . Staphylococcus aureus bacteremia with sepsis   . Tobacco abuse 01/15/2014  . Mitral regurgitation 01/13/2015    Severe by TEE  . MSSA (methicillin susceptible Staphylococcus aureus)   . Type II diabetes mellitus     not controlled  . Diabetic foot infection 01/15/2014  . 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 I&d extremity Bilateral 01/16/2014    Procedure:  DEBRIDEMENT of bilateral foot ulcers;  Surgeon: Kathryne Hitchhristopher Y Blackman, MD;  [facility];  Service: Orthopedics;  Laterality: Bilateral;  . Tee without cardioversion N/A 01/13/2015    Procedure: TRANSESOPHAGEAL ECHOCARDIOGRAM (TEE);  Surgeon: Thurmon FairMihai Croitoru, MD;  [facility];  Service: Cardiovascular;  Laterality: N/A;  . Left and right heart catheterization with coronary angiogram N/A 01/24/2015    Procedure: LEFT AND RIGHT HEART CATHETERIZATION WITH CORONARY ANGIOGRAM;  Surgeon: Peter M SwazilandJordan, MD;  [facility];  Service: Cardiovascular;  Laterality: N/A;  . Multiple  extractions with alveoloplasty N/A 01/25/2015    Procedure: Extraction of tooth #'s 1,2,3,4,5,6,8,9,11,12,13,14,15,16,17,18,19 28,31,32 with alveoloplasty and gross debridement of remaining teeth.;  Surgeon: Charlynne Pander, DDS;  Location: [facility];  Service: Oral Surgery;  Laterality: N/A;  . Cardiac catheterization  12/2014  . Shoulder arthroscopy Right 02/05/2015    Procedure: ARTHROSCOPY SHOULDER;  Surgeon: Nadara Mustard, MD;  Location: [facility] OR;  Service: Orthopedics;  Laterality: Right;  . Mitral valve replacement Right 02/18/2015     Procedure: MINIMALLY INVASIVE MITRAL VALVE (MV) REPAIR;  Surgeon: Purcell Nails, MD;  [facility]: [facility] OR;  Service: Open Heart Surgery;  Laterality: Right;  . Tee without cardioversion N/A 02/18/2015    Procedure: TRANSESOPHAGEAL ECHOCARDIOGRAM (TEE);  Surgeon: Purcell Nails, MD;  Location: [facility] OR;  Service: Open Heart Surgery;  Laterality: N/A;    History   Social History  . Marital Status: Married    Spouse Name: N/A  . Number of Children: N/A  . Years of Education: N/A   Occupational History  . Not on file.   Social History Main Topics  . Smoking status: Current Every Day Smoker -- 12 years    Types: Cigars  . Smokeless tobacco: Never Used  . Alcohol Use: No  . Drug Use: No  . Sexual Activity: Yes     Comment: 02/02/2015 "I smoked black and milds; ~ 5/day"   Other Topics Concern  . Not on file   Social History Narrative    Mr. Smeltz had no medications administered during this visit.  Family History  Problem Relation Age of Onset  . Heart attack Neg Hx   . Stroke Maternal Uncle   . Hypertension Mother   . Diabetes Brother     No Known Allergies  Current Outpatient Prescriptions on File Prior to Visit  Medication Sig Dispense Refill  . cloNIDine (CATAPRES) 0.3 MG tablet Take 1 tablet (0.3 mg total) by mouth 3 (three) times daily. 90 tablet 3  . furosemide (LASIX) 40 MG tablet Take 1 tablet (40 mg total) by mouth 2 (two) times daily. 60 tablet 5  . hydrALAZINE (APRESOLINE) 50 MG tablet Take 1 tablet (50 mg total) by mouth 3 (three) times daily. 90 tablet 11  . insulin aspart protamine- aspart (NOVOLOG MIX 70/30) (70-30) 100 UNIT/ML injection Inject 0.14 mLs (14 Units total) into the skin 2 (two) times daily with a meal. 10 mL 11  . Insulin Syringe-Needle U-100 28G X 1/2" 0.5 ML MISC Use as directed. Any generic available. 100 each 12  . Lancets (FREESTYLE) lancets Any generic available. Use as instructed 100 each 12  . warfarin (COUMADIN) 5 MG tablet Take 1 tablet  (5 mg total) by mouth daily at 6 PM. 60 tablet 0  . aspirin EC 325 MG EC tablet Take 1 tablet (325 mg total) by mouth daily. (Patient not taking: Reported on 04/26/2015) 30 tablet 0  . cyclobenzaprine (FLEXERIL) 10 MG tablet Take 1 tablet (10 mg total) by mouth 2 (two) times daily as needed for muscle spasms. (Patient not taking: Reported on 04/26/2015) 20 tablet 0  . metroNIDAZOLE (FLAGYL) 500 MG tablet Take 1 tablet (500 mg total) by mouth 3 (three) times daily. (Patient not taking: Reported on 04/29/2015) 30 tablet 0  . oxyCODONE (OXY IR/ROXICODONE) 5 MG immediate release tablet Take 1 tablet (5 mg total) by mouth every 4 (four) hours as needed for moderate pain or severe pain. (Patient not taking: Reported on 04/06/2015) 30 tablet 0  .  oxyCODONE-acetaminophen (PERCOCET/ROXICET) 5-325 MG per tablet Take 1-2 tablets by mouth every 4 (four) hours as needed for severe pain. (Patient not taking: Reported on 04/29/2015) 15 tablet 0  . warfarin (COUMADIN) 5 MG tablet Take 1 tablet (5 mg total) by mouth daily. (Patient not taking: Reported on 04/29/2015) 15 tablet 0   No current facility-administered medications on file prior to visit.     Review of Systems  Constitutional: Negative for activity change and appetite change.  HENT: Negative for sinus pressure and sore throat.   Eyes: Negative for visual disturbance.  Respiratory: Negative for chest tightness and shortness of breath.   Cardiovascular: Negative for chest pain and palpitations.  Gastrointestinal: Negative for abdominal pain and abdominal distention.  Endocrine: Negative for cold intolerance, heat intolerance and polyphagia.  Genitourinary: Negative for dysuria, frequency and difficulty urinating.  Musculoskeletal:       See HPI  Skin: Negative for color change.  Neurological: Negative for dizziness, tremors and weakness.  Psychiatric/Behavioral: Negative for suicidal ideas and behavioral problems.       Objective: Filed Vitals:    05/05/15 1203 05/05/15 1225  BP: 166/112 168/112  Pulse: 82   Temp: 98.1 F (36.7 C)   TempSrc: Oral   Resp: 18   Height: 6\' 2"  (1.88 m)   Weight: 233 lb (105.688 kg)   SpO2: 100%         Physical Exam  Constitutional: He is oriented to person, place, and time. He appears well-developed and well-nourished.  HENT:  Head: Normocephalic and atraumatic.  Right Ear: External ear normal.  Left Ear: External ear normal.  Eyes: Conjunctivae and EOM are normal. Pupils are equal, round, and reactive to light.  Neck: Normal range of motion. Neck supple. No tracheal deviation present.  Cardiovascular: Normal rate, regular rhythm and normal heart sounds.   No murmur heard. Pulmonary/Chest: Effort normal and breath sounds normal. No respiratory distress. He has no wheezes. He exhibits no tenderness.  Abdominal: Soft. Bowel sounds are normal. He exhibits no mass. There is no tenderness.  Musculoskeletal: He exhibits edema and tenderness.  Dorsal aspect of right forearm mildly edematous and slightly tender with no erythema and no increased warmth, no palpable cords  Neurological: He is alert and oriented to person, place, and time.  Skin: Skin is warm and dry.  Psychiatric: He has a normal mood and affect.          Assessment & Plan:   35 year old patient with a history of uncontrolled hypertension, uncontrolled diabetes, recent mitral valve repair , congestive heart failure and multiple office visits, ED visits and hospitalizations  with poor compliance playing a huge role these visits.  Hypertension :  uncontrolled due to noncompliance.  I have restarted his metoprolol and his blood pressure will be assess that his next office visit.    type 2 diabetes mellitus :   uncontrolled with CBG of 230 even though A1c was 7 in 03/2015.  I am adding Lantus to his regimen as he needs a basal insulin given he is just on aspart and also with his recent presentation with hyperglycemia.   blood  sugar will be reassessed at his next office visit.     congestive heart failure :  no evidence of fluid overload. Continue Lasix advised to keep appointment with cardiology which comes up next week.     status post mitral valve repair:  Last INR was 0.99 on 04/2015  currently on Coumadin the has not had an INR  checked recently which is usually done by the Coumadin clinic ; I have advised him to  Go over there after his visit today so he can obtain refills of his Coumadin as well as have his INR checked to ensure compliance.    somnolence:   the patient attributes this to feeling cold but I am concerned that this could be from his clonidine which he is taking and might need to be reviewed at his next visit.    noncompliance with treatment:   largely responsible for his multiple visits;  I am also, concerned that he has  Poor insight  throughout my discussion with the patient I  Have inferred that he is not capable of recalling his appointments, in managing his medications and social work has been contacted to schedule home nursing services for  Help with medication administration   visual symptoms:   Had previously complained of progressive visual loss and he was scheduled an appointment with ophthalmology the day after he saw me but he was a no-show however we called ophthalmology  and they have kindly accepted to give the patient another chance and will see him.   right arm swelling ;  symptoms symptoms be improving with decreased pain and decreased edema and so I will hold off on ordering a Doppler to exclude an upper extremity DVT  Or  using antibiotics and have informed him that if symptoms persist or worsen he is to get in touch with the clinic.  Hypokalemia: Unsure if he has been taking his potassium as his last potassium was 2.9 at the ED and so i have refilled it and he will need a repeat next week.

## 2015-05-11 NOTE — Telephone Encounter (Signed)
Nurse called patient, reached voicemail. Left message for patient to call Oswaldo Cueto at [phone].   

## 2015-05-11 NOTE — Telephone Encounter (Signed)
Nurse called patient, reached voicemail. Left message for patient to call Dwayne [name] at [phone].   

## 2015-05-12 ENCOUNTER — Ambulatory Visit (INDEPENDENT_AMBULATORY_CARE_PROVIDER_SITE_OTHER): Payer: Medicaid Other | Admitting: Cardiovascular Disease

## 2015-05-12 ENCOUNTER — Ambulatory Visit: Payer: Medicaid Other | Attending: Internal Medicine

## 2015-05-12 ENCOUNTER — Encounter: Payer: Self-pay | Admitting: Cardiovascular Disease

## 2015-05-12 VITALS — BP 190/110 | HR 80 | Resp 16 | Ht 74.0 in | Wt 220.7 lb

## 2015-05-12 DIAGNOSIS — I110 Hypertensive heart disease with heart failure: Secondary | ICD-10-CM | POA: Diagnosis not present

## 2015-05-12 DIAGNOSIS — B958 Unspecified staphylococcus as the cause of diseases classified elsewhere: Secondary | ICD-10-CM

## 2015-05-12 DIAGNOSIS — E876 Hypokalemia: Secondary | ICD-10-CM

## 2015-05-12 DIAGNOSIS — Z9889 Other specified postprocedural states: Secondary | ICD-10-CM | POA: Diagnosis not present

## 2015-05-12 DIAGNOSIS — I509 Heart failure, unspecified: Secondary | ICD-10-CM

## 2015-05-12 DIAGNOSIS — I5033 Acute on chronic diastolic (congestive) heart failure: Secondary | ICD-10-CM | POA: Diagnosis not present

## 2015-05-12 DIAGNOSIS — I330 Acute and subacute infective endocarditis: Secondary | ICD-10-CM

## 2015-05-12 DIAGNOSIS — L97519 Non-pressure chronic ulcer of other part of right foot with unspecified severity: Secondary | ICD-10-CM

## 2015-05-12 DIAGNOSIS — E11621 Type 2 diabetes mellitus with foot ulcer: Secondary | ICD-10-CM

## 2015-05-12 LAB — BASIC METABOLIC PANEL
BUN: 22 mg/dL (ref 6–23)
CALCIUM: 8.2 mg/dL — AB (ref 8.4–10.5)
CO2: 25 meq/L (ref 19–32)
CREATININE: 2.22 mg/dL — AB (ref 0.50–1.35)
Chloride: 100 mEq/L (ref 96–112)
Glucose, Bld: 388 mg/dL — ABNORMAL HIGH (ref 70–99)
Potassium: 3.5 mEq/L (ref 3.5–5.3)
Sodium: 138 mEq/L (ref 135–145)

## 2015-05-12 MED ORDER — CARVEDILOL 3.125 MG PO TABS: 3.1250 mg | ORAL_TABLET | Freq: Two times a day (BID) | ORAL | Status: DC

## 2015-05-12 MED ORDER — AMLODIPINE BESYLATE 10 MG PO TABS: 10.0000 mg | ORAL_TABLET | Freq: Every day | ORAL | Status: DC
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
## 2015-05-12 NOTE — Patient Instructions (Signed)
START Amlodipine 10 daily.  START in 2 weeks (May 27, 2015) Carvedilol 3.125mg  twice a day.  Your physician recommends that you schedule a follow-up appointment in: 2-3 weeks with an extender.  Your physician discussed the importance of taking an antibiotic prior to any dental, gastrointestinal, genitourinary procedures to prevent damage to the heart valves from infection. You were given a prescription for an antibiotic based on current SBE prophylaxis guidelines.

## 2015-05-13 ENCOUNTER — Telehealth: Payer: Self-pay

## 2015-05-13 NOTE — Telephone Encounter (Signed)
Nurse called patient. Patient aware of normal labs.

## 2015-05-13 NOTE — Progress Notes (Signed)
Quick Note:  Please inform the patient that labs are normal. Thank you. ______ 

## 2015-05-13 NOTE — Telephone Encounter (Signed)
-----   Message from Jaclyn ShaggyEnobong Amao, MD sent at 05/13/2015  8:51 AM EDT ----- Please inform the patient that labs are normal. Thank you.

## 2015-05-16 ENCOUNTER — Ambulatory Visit (INDEPENDENT_AMBULATORY_CARE_PROVIDER_SITE_OTHER): Payer: Medicaid Other | Admitting: Thoracic Surgery (Cardiothoracic Vascular Surgery)

## 2015-05-16 ENCOUNTER — Encounter: Payer: Self-pay | Admitting: Thoracic Surgery (Cardiothoracic Vascular Surgery)

## 2015-05-16 VITALS — BP 197/120 | HR 98 | Resp 16 | Ht 74.0 in | Wt 223.0 lb

## 2015-05-16 DIAGNOSIS — Z9889 Other specified postprocedural states: Secondary | ICD-10-CM

## 2015-05-16 DIAGNOSIS — I340 Nonrheumatic mitral (valve) insufficiency: Secondary | ICD-10-CM

## 2015-05-16 DIAGNOSIS — I330 Acute and subacute infective endocarditis: Secondary | ICD-10-CM

## 2015-05-16 NOTE — Progress Notes (Signed)
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 He originally presented in early January and blood cultures grew Staphylococcus aureus sensitive to both penicillin and oxacillin. He was evaluated by a multidisciplinary team at specialists including consultants from the infectious disease team, cardiology, nephrology, orthopedic surgery and dental medicine. The patient's renal failure recovered without need for dialysis. Initial attempts at management of septic arthritis without surgical intervention failed and the patient ultimately underwent arthroscopic joint debridement and washout by Dr. Lajoyce [name]. The patient underwent dental extraction. Cardiac catheterization revealed no significant coronary artery disease. The patient was treated with more than 4 weeks of continuous intravenous antibiotic therapy and remained clinically stable. Follow-up blood cultures on antibiotics all remained no growth. Ultimately the patient was felt to be in optimal condition for surgery and he underwent uncomplicated minimally invasive mitral valve repair on 02/18/2015. The patient's postoperative course was remarkably uncomplicated. He was ultimately discharged from the hospital on 02/25/2015 with a PICC line in place for home administration of intravenous antibiotics.He was last seen  here in our office on 03/15/2015. He completed his course of antibiotics and his PICC line was removed. He underwent a follow-up echocardiogram on 03/31/2015 that revealed intact mitral valve repair with no mitral regurgitation and normal left ventricular systolic function. Ejection fraction was estimated 60-65%.  There was no sign of any vegetations.  The patient has been seen in follow up by Dr. Orvan [name] in the Infectious Disease Clinic and on several occasions at the [facility] office, most recently by Dr. Royann [name].  He returns to our office for routine follow-up today.  The patient states that he ran out of Coumadin and stopped using it. He ran out of several of his blood pressure medications although he states that he thinks he's taking Clonidine and Hydralazine.  He's not sure.  He seems to be aware that his blood pressure has remained out of control. He was recently given to new prescriptions by Dr. Royann [name] including Amlodipine and Carvedilol.  He has not gotten either of these prescriptions filled. He states that he recently received Medicaid approval but he clearly doesn't understand what that means.  He denies any shortness of breath. He is not having any chest pain. He denies any fevers or chills.  He complains that every time he stops taking Lasix his lower extremity edema recurs.     Current Outpatient Prescriptions  Medication Sig Dispense Refill  . amLODipine (NORVASC) 10 MG tablet Take 1 tablet (10 mg total) by mouth daily. 90 tablet 1  . aspirin EC 325 MG EC tablet Take 1 tablet (325 mg total) by mouth daily. 30 tablet 0  . carvedilol (COREG) 3.125 MG tablet Take 1 tablet (3.125 mg total) by mouth 2 (two) times daily. 180 tablet 1  . cloNIDine (CATAPRES) 0.3 MG tablet Take 1 tablet (0.3 mg total)  by mouth 3 (three) times daily. 90 tablet 3  . cyclobenzaprine (FLEXERIL) 10 MG tablet Take 1 tablet (10 mg total) by mouth 2 (two) times daily as needed for muscle spasms. 20 tablet 0  .  furosemide (LASIX) 40 MG tablet Take 1 tablet (40 mg total) by mouth 2 (two) times daily. 60 tablet 5  . hydrALAZINE (APRESOLINE) 50 MG tablet Take 1 tablet (50 mg total) by mouth 3 (three) times daily. 90 tablet 11  . insulin aspart protamine- aspart (NOVOLOG MIX 70/30) (70-30) 100 UNIT/ML injection Inject 0.14 mLs (14 Units total) into the skin 2 (two) times daily with a meal. 10 mL 11  . Insulin Glargine (LANTUS SOLOSTAR) 100 UNIT/ML Solostar Pen Inject 5 Units into the skin daily at 10 pm. 2 pen 1  . Insulin Syringe-Needle U-100 28G X 1/2" 0.5 ML MISC Use as directed. Any generic available. 100 each 12  . Lancets (FREESTYLE) lancets Any generic available. Use as instructed 100 each 12  . potassium chloride SA (K-DUR,KLOR-CON) 20 MEQ tablet Take 2 tablets daily 60 tablet 1   No current facility-administered medications for this visit.      Physical Exam:   BP 197/120 mmHg  Pulse 98  Resp 16  Ht 6\' 2"  (1.88 m)  Wt 223 lb (101.152 kg)  BMI 28.62 kg/m2  SpO2 99%  General:  Alert, no distress  Chest:   clear  CV:   RRR w/out murmur  Incisions:  Well healed  Abdomen:  soft  Extremities:  Warm, well-perfused, mild-moderate bilateral LE edema   Diagnostic Tests:  Transthoracic Echocardiography  Patient:  Dwayne [name], Dwayne [name] MR #:    [id number] Study Date: 03/31/2015 Gender:   M Age:    34 Height:   188 cm Weight:   97.5 kg BSA:    2.27 m^2 Pt. Status: Room:  ATTENDING  Dwayne [name], Dwayne T ORDERING   [name], Dwayne T REFERRING  Dwayne NewcomerWeaver, Dwayne T SONOGRAPHER Dwayne [name], RDCS PERFORMING  [facility], Outpatient  cc:  ------------------------------------------------------------------- LV EF: 60% -  65%  ------------------------------------------------------------------- Indications:   Z98.89 Mitral Valve Repair.  ------------------------------------------------------------------- History:  PMH: Acquired from the patient and from the  patient&'s chart. PMH: Endocarditis. Acute Pericarditis. CHF. Peripheral Vascular Disease. Chronic Kidney Disease. Bacteremia. Septic Shock. Risk factors: Current tobacco use. Hypertension. Diabetes mellitus.  ------------------------------------------------------------------- Study Conclusions  - Left ventricle: The cavity size was normal. There was severe concentric hypertrophy. Systolic function was normal. The estimated ejection fraction was in the range of 60% to 65%. Wall motion was normal; there were no regional wall motion abnormalities. Features are consistent with a pseudonormal left ventricular filling pattern, with concomitant abnormal relaxation and increased filling pressure (grade 2 diastolic dysfunction). - Mitral valve: Prior procedures included surgical repair. An annular ring prosthesis was present and functioning normally. There was no significant regurgitation. Mean gradient (D): 6 mm Hg. - Left atrium: The atrium was mildly dilated.  Impressions:  - There was no evidence of a vegetation.  ------------------------------------------------------------------- Labs, prior tests, procedures, and surgery: Status post Mitral Valve Repair.-28 mm Sorin Memo 3D Annuloplasty via right Mini Thoracotomy.     Transthoracic echocardiography. M-mode, complete 2D, spectral Doppler, and color Doppler. Birthdate: Patient birthdate: Feb 04, 1980. Age: Patient is 35 yr old. Sex: Gender: male.  BMI: 27.6 kg/m^2. Blood pressure:   185/116 Patient status: Outpatient. Study date: Study date: 03/31/2015. Study time: 09:39 AM. Location: [facility]  -------------------------------------------------------------------  ------------------------------------------------------------------- Left ventricle: The cavity size was normal. There was  severe concentric hypertrophy. Systolic function was normal. The estimated ejection fraction was  in the range of 60% to 65%. Wall motion was normal; there were no regional wall motion abnormalities. Features are consistent with a pseudonormal left ventricular filling pattern, with concomitant abnormal relaxation and increased filling pressure (grade 2 diastolic dysfunction).  ------------------------------------------------------------------- Aortic valve:  Trileaflet; normal thickness leaflets. Mobility was not restricted. Doppler: Transvalvular velocity was within the normal range. There was no stenosis. There was no regurgitation.  ------------------------------------------------------------------- Aorta: Aortic root: The aortic root was normal in size.  ------------------------------------------------------------------- Mitral valve: Prior procedures included surgical repair. An annular ring prosthesis was present and functioning normally. Doppler: Transvalvular velocity was minimally elevated. There was no evidence for stenosis. There was no significant regurgitation. Valve area by pressure half-time: 1.57 cm^2. Indexed valve area by pressure half-time: 0.69 cm^2/m^2. Valve area by continuity equation (using LVOT flow): 1.07 cm^2. Indexed valve area by continuity equation (using LVOT flow): 0.47 cm^2/m^2.  Mean gradient (D): 6 mm Hg. Peak gradient (D): 9 mm Hg.  ------------------------------------------------------------------- Left atrium: The atrium was mildly dilated.  ------------------------------------------------------------------- Right ventricle: The cavity size was normal. Wall thickness was normal. Systolic function was normal.  ------------------------------------------------------------------- Pulmonic valve:  Poorly visualized. Structurally normal valve. Cusp separation was normal. Doppler: Transvalvular velocity was within the normal range. There was no evidence for stenosis. There was no  regurgitation.  ------------------------------------------------------------------- Tricuspid valve:  Structurally normal valve.  Doppler: Transvalvular velocity was within the normal range. There was no regurgitation.  ------------------------------------------------------------------- Pulmonary artery:  The main pulmonary artery was normal-sized. Systolic pressure was within the normal range.  ------------------------------------------------------------------- Right atrium: The atrium was normal in size.  ------------------------------------------------------------------- Pericardium: There was no pericardial effusion.  ------------------------------------------------------------------- Systemic veins: Inferior vena cava: The vessel was normal in size.  ------------------------------------------------------------------- Measurements  Left ventricle              Value     Reference LV ID, ED, PLAX chordal      (L)   41  mm    43 - 52 LV ID, ES, PLAX chordal          28  mm    23 - 38 LV fx shortening, PLAX chordal      32  %    >=29 LV PW thickness, ED            19  mm    --------- IVS/LV PW ratio, ED            0.84      <=1.3 Stroke volume, 2D             70  ml    --------- Stroke volume/bsa, 2D           31  ml/m^2  --------- LV e&', lateral              7.9  cm/s   --------- LV E/e&', lateral             18.99     --------- LV e&', medial               4.67 cm/s   --------- LV E/e&', medial              32.12     --------- LV e&', average              6.29 cm/s   --------- LV E/e&', average  23.87     ---------  Ventricular septum            Value     Reference IVS thickness, ED              16  mm    ---------  LVOT                   Value     Reference LVOT ID, S                19  mm    --------- LVOT area                 2.84 cm^2   --------- LVOT mean velocity, S           85.3 cm/s   --------- LVOT VTI, S                24.5 cm    ---------  Aorta                   Value     Reference Aortic root ID, ED            33  mm    ---------  Left atrium                Value     Reference LA ID, A-P, ES              43  mm    --------- LA ID/bsa, A-P              1.89 cm/m^2  <=2.2 LA volume, S               62.8 ml    --------- LA volume/bsa, S             27.7 ml/m^2  --------- LA volume, ES, 1-p A4C          62.2 ml    --------- LA volume/bsa, ES, 1-p A4C        27.4 ml/m^2  --------- LA volume, ES, 1-p A2C          58.9 ml    --------- LA volume/bsa, ES, 1-p A2C        25.9 ml/m^2  ---------  Mitral valve               Value     Reference Mitral E-wave peak velocity        150  cm/s   --------- Mitral mean velocity, D          107  cm/s   --------- Mitral deceleration time     (H)   415  ms    150 - 230 Mitral pressure half-time         132  ms    --------- Mitral mean gradient, D          6   mm Hg  --------- Mitral peak gradient, D          9   mm Hg  --------- Mitral E/A ratio, peak          1       --------- Mitral valve area, PHT, DP        1.57 cm^2   --------- Mitral valve area/bsa, PHT, DP      0.69 cm^2/m^2 --------- Mitral valve area, LVOT          1.07 cm^2   ---------  continuity Mitral valve area/bsa, LVOT         0.47 cm^2/m^2 --------- continuity Mitral annulus VTI, D           65.2 cm    ---------  Tricuspid valve              Value     Reference Tricuspid regurg peak velocity      242  cm/s   --------- Tricuspid peak RV-RA gradient       23  mm Hg  ---------  Right ventricle              Value     Reference RV s&', lateral, S             10.6 cm/s   ---------  Legend: (L) and (H) mark values outside specified reference range.  ------------------------------------------------------------------- Prepared and Electronically Authenticated by  Donato Schultz, M.D. 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 Follow-up echocardiogram looks quite good with intact mitral valve repair and normal left ventricular systolic function. Unfortunately, patient's chronic medical problems remain poorly controlled and the patient has been noncompliant with medical therapy.    Plan:  I spent in excess of 10 minutes discussing the importance of taking medications as prescribed to control the patient's severe hypertension and diabetes. I have emphasized the potential lethal complications associated with poor control of high blood pressure including the significant risk of stroke, aortic dissection, myocardial infarction, congestive heart failure, renal failure, and death. I made an effort to explain the associated benefits of Medicaid enrollment as well as the input from all the other consulting physicians and specialty clinics where the patient has been offered care. The patient seems to have a very limited understanding of his underlying medical conditions and the  potential consequences associated with noncompliance. I have made it very clear that I anticipate his life expectancy will be relatively short if he cannot come to terms with the issues surrounding his chronic medical conditions. He has specifically been reminded regarding the potential benefits of monitoring his blood pressure on a regular basis, the need for sodium restriction and weight monitoring, and careful management of all of his associated medications. All of his questions been addressed. He will be invited to return for routine follow-up next February, approximately 1 year following his original surgery, if he is still alive and living locally in [place].    Salvatore Decent. Cornelius Moras, MD 05/16/2015 5:45 PM

## 2015-05-16 NOTE — Patient Instructions (Addendum)
  Seek assistance from your local Medicaid office, Social Services and the Health and Wellness clinic to learn how to get your medications filled and dispensed on a regular basis.  Take all of your blood pressure and diabetes medications as prescribed.  Check your blood pressure on a regular basis.  Check your blood sugars on a regular basis.   Endocarditis is a potentially serious infection of heart valves or inside lining of the heart.  It occurs more commonly in patients with diseased heart valves (such as patient's with aortic or mitral valve disease) and in patients who have undergone heart valve repair or replacement.  Certain surgical and dental procedures may put you at risk, such as dental cleaning, other dental procedures, or any surgery involving the respiratory, urinary, gastrointestinal tract, gallbladder or prostate gland.   To minimize your chances for develooping endocarditis, maintain good oral health and seek prompt medical attention for any infections involving the mouth, teeth, gums, skin or urinary tract.  Always notify your doctor or dentist about your underlying heart valve condition before having any invasive procedures. You will need to take antibiotics before certain procedures.

## 2015-05-19 LAB — HM DIABETES EYE EXAM

## 2015-05-20 ENCOUNTER — Other Ambulatory Visit: Payer: Self-pay | Admitting: Internal Medicine

## 2015-05-20 DIAGNOSIS — F32A Depression, unspecified: Secondary | ICD-10-CM

## 2015-05-20 DIAGNOSIS — F329 Major depressive disorder, single episode, unspecified: Secondary | ICD-10-CM

## 2015-05-23 ENCOUNTER — Encounter: Payer: Self-pay | Admitting: Internal Medicine

## 2015-05-23 ENCOUNTER — Ambulatory Visit: Payer: Medicaid Other | Attending: Internal Medicine | Admitting: Internal Medicine

## 2015-05-23 VITALS — BP 119/76 | HR 75 | Wt 222.6 lb

## 2015-05-23 DIAGNOSIS — E118 Type 2 diabetes mellitus with unspecified complications: Secondary | ICD-10-CM | POA: Diagnosis not present

## 2015-05-23 DIAGNOSIS — I10 Essential (primary) hypertension: Secondary | ICD-10-CM

## 2015-05-23 DIAGNOSIS — E1121 Type 2 diabetes mellitus with diabetic nephropathy: Secondary | ICD-10-CM | POA: Diagnosis not present

## 2015-05-23 DIAGNOSIS — I5021 Acute systolic (congestive) heart failure: Secondary | ICD-10-CM | POA: Diagnosis not present

## 2015-05-23 LAB — BASIC METABOLIC PANEL WITH GFR
BUN: 17 mg/dL (ref 6–23)
CO2: 31 mEq/L (ref 19–32)
CREATININE: 1.84 mg/dL — AB (ref 0.50–1.35)
Calcium: 8.2 mg/dL — ABNORMAL LOW (ref 8.4–10.5)
Chloride: 102 mEq/L (ref 96–112)
GFR, EST AFRICAN AMERICAN: 54 mL/min — AB
GFR, EST NON AFRICAN AMERICAN: 46 mL/min — AB
GLUCOSE: 195 mg/dL — AB (ref 70–99)
POTASSIUM: 3.3 meq/L — AB (ref 3.5–5.3)
Sodium: 141 mEq/L (ref 135–145)

## 2015-05-23 LAB — GLUCOSE, POCT (MANUAL RESULT ENTRY): POC Glucose: 282 mg/dl — AB (ref 70–99)

## 2015-05-23 MED ORDER — HYDRALAZINE HCL 50 MG PO TABS: 50.0000 mg | ORAL_TABLET | Freq: Three times a day (TID) | ORAL | Status: DC

## 2015-05-23 MED ORDER — FUROSEMIDE 40 MG PO TABS: 40.0000 mg | ORAL_TABLET | Freq: Two times a day (BID) | ORAL | Status: DC

## 2015-05-23 MED ORDER — INSULIN ASPART PROT & ASPART (70-30 MIX) 100 UNIT/ML ~~LOC~~ SUSP: 14.0000 [IU] | Freq: Two times a day (BID) | SUBCUTANEOUS | Status: DC

## 2015-05-23 MED ORDER — CLONIDINE HCL 0.3 MG PO TABS: 0.3000 mg | ORAL_TABLET | Freq: Three times a day (TID) | ORAL | Status: DC

## 2015-05-23 MED ORDER — INSULIN GLARGINE 100 UNIT/ML SOLOSTAR PEN: 5.0000 [IU] | PEN_INJECTOR | Freq: Every day | SUBCUTANEOUS | Status: DC

## 2015-05-23 NOTE — Patient Instructions (Signed)
Diabetes and Exercise Exercising regularly is important. It is not just about losing weight. It has many health benefits, such as:  Improving your overall fitness, flexibility, and endurance.  Increasing your bone density.  Helping with weight control.  Decreasing your body fat.  Increasing your muscle strength.  Reducing stress and tension.  Improving your overall health. People with diabetes who exercise gain additional benefits because exercise:  Reduces appetite.  Improves the body's use of blood sugar (glucose).  Helps lower or control blood glucose.  Decreases blood pressure.  Helps control blood lipids (such as cholesterol and triglycerides).  Improves the body's use of the hormone insulin by:  Increasing the body's insulin sensitivity.  Reducing the body's insulin needs.  Decreases the risk for heart disease because exercising:  Lowers cholesterol and triglycerides levels.  Increases the levels of good cholesterol (such as high-density lipoproteins [HDL]) in the body.  Lowers blood glucose levels. YOUR ACTIVITY PLAN  Choose an activity that you enjoy and set realistic goals. Your health care provider or diabetes educator can help you make an activity plan that works for you. Exercise regularly as directed by your health care provider. This includes:  Performing resistance training twice a week such as push-ups, sit-ups, lifting weights, or using resistance bands.  Performing 150 minutes of cardio exercises each week such as walking, running, or playing sports.  Staying active and spending no more than 90 minutes at one time being inactive. Even short bursts of exercise are good for you. Three 10-minute sessions spread throughout the day are just as beneficial as a single 30-minute session. Some exercise ideas include:  Taking the dog for a walk.  Taking the stairs instead of the elevator.  Dancing to your favorite song.  Doing an exercise  video.  Doing your favorite exercise with a friend. RECOMMENDATIONS FOR EXERCISING WITH TYPE 1 OR TYPE 2 DIABETES   Check your blood glucose before exercising. If blood glucose levels are greater than 240 mg/dL, check for urine ketones. Do not exercise if ketones are present.  Avoid injecting insulin into areas of the body that are going to be exercised. For example, avoid injecting insulin into:  The arms when playing tennis.  The legs when jogging.  Keep a record of:  Food intake before and after you exercise.  Expected peak times of insulin action.  Blood glucose levels before and after you exercise.  The type and amount of exercise you have done.  Review your records with your health care provider. Your health care provider will help you to develop guidelines for adjusting food intake and insulin amounts before and after exercising.  If you take insulin or oral hypoglycemic agents, watch for signs and symptoms of hypoglycemia. They include:  Dizziness.  Shaking.  Sweating.  Chills.  Confusion.  Drink plenty of water while you exercise to prevent dehydration or heat stroke. Body water is lost during exercise and must be replaced.  Talk to your health care provider before starting an exercise program to make sure it is safe for you. Remember, almost any type of activity is better than none. Document Released: 03/08/2004 Document Revised: 05/03/2014 Document Reviewed: 05/26/2013 ExitCare Patient Information 2015 [employer]. This information is not intended to replace advice given to you by your health care provider. Make sure you discuss any questions you have with your health care provider. Basic Carbohydrate Counting for Diabetes Mellitus Carbohydrate counting is a method for keeping track of the amount of carbohydrates you eat.   Eating carbohydrates naturally increases the level of sugar (glucose) in your blood, so it is important for you to know the amount that is  okay for you to have in every meal. Carbohydrate counting helps keep the level of glucose in your blood within normal limits. The amount of carbohydrates allowed is different for every person. A dietitian can help you calculate the amount that is right for you. Once you know the amount of carbohydrates you can have, you can count the carbohydrates in the foods you want to eat. Carbohydrates are found in the following foods:  Grains, such as breads and cereals.  Dried beans and soy products.  Starchy vegetables, such as potatoes, peas, and corn.  Fruit and fruit juices.  Milk and yogurt.  Sweets and snack foods, such as cake, cookies, candy, chips, soft drinks, and fruit drinks. CARBOHYDRATE COUNTING There are two ways to count the carbohydrates in your food. You can use either of the methods or a combination of both. Reading the "Nutrition Facts" on Packaged Food The "Nutrition Facts" is an area that is included on the labels of almost all packaged food and beverages in the [place]. It includes the serving size of that food or beverage and information about the nutrients in each serving of the food, including the grams (g) of carbohydrate per serving.  Decide the number of servings of this food or beverage that you will be able to eat or drink. Multiply that number of servings by the number of grams of carbohydrate that is listed on the label for that serving. The total will be the amount of carbohydrates you will be having when you eat or drink this food or beverage. Learning Standard Serving Sizes of Food When you eat food that is not packaged or does not include "Nutrition Facts" on the label, you need to measure the servings in order to count the amount of carbohydrates.A serving of most carbohydrate-rich foods contains about 15 g of carbohydrates. The following list includes serving sizes of carbohydrate-rich foods that provide 15 g ofcarbohydrate per serving:   1 slice of bread  (1 oz) or 1 six-inch tortilla.    of a hamburger bun or English muffin.  4-6 crackers.   cup unsweetened dry cereal.    cup hot cereal.   cup rice or pasta.    cup mashed potatoes or  of a large baked potato.  1 cup fresh fruit or one small piece of fruit.    cup canned or frozen fruit or fruit juice.  1 cup milk.   cup plain fat-free yogurt or yogurt sweetened with artificial sweeteners.   cup cooked dried beans or starchy vegetable, such as peas, corn, or potatoes.  Decide the number of standard-size servings that you will eat. Multiply that number of servings by 15 (the grams of carbohydrates in that serving). For example, if you eat 2 cups of strawberries, you will have eaten 2 servings and 30 g of carbohydrates (2 servings x 15 g = 30 g). For foods such as soups and casseroles, in which more than one food is mixed in, you will need to count the carbohydrates in each food that is included. EXAMPLE OF CARBOHYDRATE COUNTING Sample Dinner  3 oz chicken breast.   cup of brown rice.   cup of corn.  1 cup milk.   1 cup strawberries with sugar-free whipped topping.  Carbohydrate Calculation Step 1: Identify the foods that contain carbohydrates:   Rice.   Corn.     Milk.   Strawberries. Step 2:Calculate the number of servings eaten of each:   2 servings of rice.   1 serving of corn.   1 serving of milk.   1 serving of strawberries. Step 3: Multiply each of those number of servings by 15 g:   2 servings of rice x 15 g = 30 g.   1 serving of corn x 15 g = 15 g.   1 serving of milk x 15 g = 15 g.   1 serving of strawberries x 15 g = 15 g. Step 4: Add together all of the amounts to find the total grams of carbohydrates eaten: 30 g + 15 g + 15 g + 15 g = 75 g. Document Released: 12/17/2005 Document Revised: 05/03/2014 Document Reviewed: 11/13/2013 ExitCare Patient Information 2015 [employer]. This information is not intended to  replace advice given to you by your health care provider. Make sure you discuss any questions you have with your health care provider.  

## 2015-05-23 NOTE — Progress Notes (Signed)
Patient ID: Dwayne HickmanBenjamin [name], male   DOB: 04/22/1980, 35 y.o.   MRN: [id number]   Dwayne HickmanBenjamin [name], is a 35 y.o. male  [id number]  [id number]  DOB - 12/10/1980  Chief Complaint  Patient presents with  . Hypertension Follow up        Subjective:   Dwayne [name] is a 35 y.o. male here today for a follow up visit. 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 He follows up regularly with cardiologist and infectious disease. He has no new complaints today. He claims compliant with medication but has run out of most of them, needing refills. Patient appears depressed today, answering the questions with monosyllables. He said he is not sure of which medications he is taking but he knows of the recently prescribed amlodipine and carvedilol. Patient has No headache, No chest pain, No abdominal pain - No Nausea, No new weakness tingling or numbness, No Cough - SOB.  Problem  Diabetic Nephropathy Associated With Type 2 Diabetes Mellitus    ALLERGIES: No Known Allergies  PAST MEDICAL HISTORY: Past Medical History  Diagnosis Date  . Hypertension   . Acute renal failure   . Bacteremia     MSSA   . Diabetes type 2, uncontrolled 01/09/2015  . 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 Essential hypertension, benign 01/18/2014  . Infective myositis of left thigh   . Noncompliance 01/18/2014  . Septic shock   . Staphylococcus aureus bacteremia with sepsis   . Tobacco abuse 01/15/2014  . Mitral regurgitation 01/13/2015    Severe by TEE  . MSSA (methicillin susceptible Staphylococcus aureus)   . Type II diabetes mellitus     not controlled  . Diabetic foot infection 01/15/2014  .  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 Authorizing Provider  amLODipine (NORVASC) 10 MG tablet Take 1 tablet (10 mg total) by mouth daily. 05/12/15  Yes Vesta MixerPhilip J Nahser, MD  aspirin EC 325 MG EC tablet Take 1 tablet (325 mg total) by mouth daily. 02/25/15  Yes Donielle Margaretann LovelessM Zimmerman, PA-C  carvedilol (COREG) 3.125 MG tablet Take 1 tablet (3.125 mg total) by mouth 2 (two) times daily. 05/12/15  Yes Vesta MixerPhilip J Nahser, MD  cloNIDine (CATAPRES) 0.3 MG tablet Take 1 tablet (0.3 mg total) by mouth 3 (three) times daily. 05/23/15  Yes Quentin Angstlugbemiga E Epiphany Seltzer, MD  furosemide (LASIX) 40 MG tablet Take 1 tablet (40 mg total) by mouth 2 (two) times daily. 05/23/15  Yes Quentin Angstlugbemiga E Zeb Rawl, MD  hydrALAZINE (APRESOLINE) 50 MG tablet Take 1 tablet (50 mg total) by mouth 3 (three) times daily. 05/23/15  Yes Jozie Wulf Annitta NeedsE Paxten Appelt, MD  insulin aspart protamine- aspart (NOVOLOG MIX 70/30) (70-30) 100 UNIT/ML injection Inject 0.14 mLs (14 Units total) into the skin 2 (two) times daily with a meal. 05/23/15  Yes Argil Mahl E Hyman HopesJegede, MD  Insulin Glargine (LANTUS SOLOSTAR) 100 UNIT/ML Solostar Pen Inject 5 Units into the skin daily at 10 pm. 05/23/15  Yes Quentin Angstlugbemiga E Khallid Pasillas, MD  Insulin  Syringe-Needle U-100 28G X 1/2" 0.5 ML MISC Use as directed. Any generic available. 01/28/15  Yes [name], DO  Lancets (FREESTYLE) lancets Any generic available. Use as instructed 01/28/15  Yes [name], DO  potassium chloride SA (K-DUR,KLOR-CON) 20 MEQ tablet Take 2 tablets daily 05/05/15  Yes Jaclyn Shaggy, MD  cyclobenzaprine (FLEXERIL) 10 MG tablet Take 1 tablet (10 mg total) by mouth 2 (two) times daily as needed for muscle spasms. Patient not  taking: Reported on 05/23/2015 04/15/15   [name], PA-C     Objective:   Filed Vitals:   05/23/15 1116  BP: 119/76  Pulse: 75  Weight: 222 lb 9.6 oz (100.971 kg)  SpO2: 100%    Exam General appearance : Awake, alert, not in any distress. Speech Clear. Not toxic looking HEENT: Atraumatic and Normocephalic, pupils equally reactive to light and accomodation Neck: supple, no JVD. No cervical lymphadenopathy.  Chest:Good air entry bilaterally, no added sounds  CVS: S1 S2 regular, no murmurs.  Abdomen: Bowel sounds present, Non tender and not distended with no gaurding, rigidity or rebound. Extremities: B/L Lower Ext shows no edema, both legs are warm to touch Neurology: Awake alert, and oriented X 3, CN II-XII intact, Non focal, minimal or no eye contact, monotone, appears disheveled, smells of nicotine   Data Review Lab Results  Component Value Date   HGBA1C 7.0 03/08/2015   HGBA1C 6.8* 02/18/2015   HGBA1C 7.5* 02/02/2015     Assessment & Plan   1. Type 2 diabetes mellitus with complications  - Glucose (CBG) - Insulin Glargine (LANTUS SOLOSTAR) 100 UNIT/ML Solostar Pen; Inject 5 Units into the skin daily at 10 pm.  Dispense: 3 pen; Refill: 3 - insulin aspart protamine- aspart (NOVOLOG MIX 70/30) (70-30) 100 UNIT/ML injection; Inject 0.14 mLs (14 Units total) into the skin 2 (two) times daily with a meal.  Dispense: 10 mL; Refill: 11  - Ambulatory referral to Podiatry   Aim for 2-3 Carb Choices per meal (30-45 grams) +/- 1 either way  Aim for 0-15 Carbs per snack if hungry  Include protein in moderation with your meals and snacks  Consider reading food labels for Total Carbohydrate and Fat Grams of foods  Consider checking BG at alternate times per day  Continue taking medication as directed Fruit Punch - find one with no sugar  Measure and decrease portions of carbohydrate foods  Make your plate and don't go back for seconds   2. Acute systolic congestive heart  failure  - furosemide (LASIX) 40 MG tablet; Take 1 tablet (40 mg total) by mouth 2 (two) times daily.  Dispense: 60 tablet; Refill: 5  3. Essential hypertension  - cloNIDine (CATAPRES) 0.3 MG tablet; Take 1 tablet (0.3 mg total) by mouth 3 (three) times daily.  Dispense: 90 tablet; Refill: 3 - hydrALAZINE (APRESOLINE) 50 MG tablet; Take 1 tablet (50 mg total) by mouth 3 (three) times daily.  Dispense: 90 tablet; Refill: 11  - We have discussed target BP range and blood pressure goal - I have advised patient to check BP regularly and to call us back or report to clinic if the numbers are consistently higher than 140/90  - We discussed the importance of compliance with medical therapy and DASH diet recommended, consequences of uncontrolled hypertension discussed.  - continue current BP medications  4. Diabetic nephropathy associated with type 2 diabetes mellitus  - BASIC METABOLIC PANEL WITH GFR - Microalbumin / creatinine urine ratio - Microalbumin, urine  Patient  appeared depressed but he denies suicidal ideation or thoughts, I have placed a referral to psychiatrist and our social worker here today will see patient for counseling and support as necessary.  Patient have been counseled extensively about nutrition and exercise Return in about 4 weeks (around 06/20/2015), or if symptoms worsen or fail to improve, for Follow up HTN, Follow up Pain and comorbidities.  The patient was given clear instructions to go to ER or return to medical center if symptoms don't improve, worsen or new problems develop. The patient verbalized understanding. The patient was told to call to get lab results if they haven't heard anything in the next week.   This note has been created with Education officer, environmental. Any transcriptional errors are unintentional.    Jeanann Lewandowsky, MD, MHA, CPE, FACP, FAAP [facility] [place],  [place] [phone]   05/23/2015, 11:52 AM

## 2015-05-24 ENCOUNTER — Telehealth: Payer: Self-pay | Admitting: *Deleted

## 2015-05-24 LAB — MICROALBUMIN / CREATININE URINE RATIO
Creatinine, Urine: 109.9 mg/dL
MICROALB UR: 364 mg/dL — AB (ref <2.0)
Microalb Creat Ratio: 3312.1 mg/g — ABNORMAL HIGH (ref 0.0–30.0)

## 2015-05-24 NOTE — Telephone Encounter (Signed)
Patient's nephrologist found that the patient's blood pressure is overcontrolled, cancelled patient's norvasc.  Records sent to Dr. Blair Dolphinampbell's attention. Andree CossHowell, Niketa Turner M, RN

## 2015-05-27 ENCOUNTER — Telehealth: Payer: Self-pay | Admitting: *Deleted

## 2015-05-27 ENCOUNTER — Other Ambulatory Visit: Payer: Self-pay | Admitting: Internal Medicine

## 2015-05-27 NOTE — Telephone Encounter (Signed)
-----   Message from Quentin Angstlugbemiga E Jegede, MD sent at 05/27/2015  3:36 PM EDT ----- Please inform patient that this basic metabolic panel shows reduced but stable kidney function, slightly low potassium. Encouraged patient to continue potassium tablet as prescribed. Continue blood sugar control, low carbohydrate low sugar diet.

## 2015-05-27 NOTE — Telephone Encounter (Signed)
Gave results to patient and he had no questions.

## 2015-05-31 ENCOUNTER — Encounter (INDEPENDENT_AMBULATORY_CARE_PROVIDER_SITE_OTHER): Payer: Self-pay | Admitting: Ophthalmology

## 2015-06-07 ENCOUNTER — Ambulatory Visit (INDEPENDENT_AMBULATORY_CARE_PROVIDER_SITE_OTHER): Payer: Medicaid Other | Admitting: Podiatry

## 2015-06-07 DIAGNOSIS — L97529 Non-pressure chronic ulcer of other part of left foot with unspecified severity: Secondary | ICD-10-CM

## 2015-06-07 DIAGNOSIS — E10621 Type 1 diabetes mellitus with foot ulcer: Secondary | ICD-10-CM

## 2015-06-07 DIAGNOSIS — L89891 Pressure ulcer of other site, stage 1: Secondary | ICD-10-CM | POA: Diagnosis not present

## 2015-06-07 DIAGNOSIS — L97519 Non-pressure chronic ulcer of other part of right foot with unspecified severity: Secondary | ICD-10-CM

## 2015-06-07 DIAGNOSIS — B351 Tinea unguium: Secondary | ICD-10-CM | POA: Diagnosis not present

## 2015-06-07 DIAGNOSIS — E1165 Type 2 diabetes mellitus with hyperglycemia: Secondary | ICD-10-CM | POA: Diagnosis not present

## 2015-06-07 DIAGNOSIS — IMO0002 Reserved for concepts with insufficient information to code with codable children: Secondary | ICD-10-CM

## 2015-06-07 DIAGNOSIS — E1151 Type 2 diabetes mellitus with diabetic peripheral angiopathy without gangrene: Secondary | ICD-10-CM

## 2015-06-07 DIAGNOSIS — E08621 Diabetes mellitus due to underlying condition with foot ulcer: Secondary | ICD-10-CM

## 2015-06-07 DIAGNOSIS — E1041 Type 1 diabetes mellitus with diabetic mononeuropathy: Secondary | ICD-10-CM

## 2015-06-07 MED ORDER — DOXYCYCLINE HYCLATE 50 MG PO CAPS: 100.0000 mg | ORAL_CAPSULE | Freq: Two times a day (BID) | ORAL | Status: DC

## 2015-06-07 NOTE — Progress Notes (Signed)
Subjective:     Patient ID: Dwayne [name], male   DOB: 07/05/1980, 35 y.o.   MRN: [id number]  HPI  [facility] 35 [facility]  No drainage noted.  No infection or odor noted.ellness clinic saying he has developed ulcers on the bottom of both feet.  He says these have developed in the last 2 weeks.  He gives a history of previous ulcers on his feet which healed and closed when he was hospitalized.  He is now walking everywhere and the skin has broken down.  He says there is drainage from his right foot.  He has sought no treatment and presents for evaluation and treatment. He says he has taken insulin but his sugar is 165.   Review of Systems     Objective:   Physical Exam  Dorsalis pedis and posterior tibial pulses are palpable.  His LOPS is diminished.  Muscle POWER WNL.He has one ulcer on bottom left foot under 2-4 metheads.  Dimensions are 15mm x 15 mm.  No drainage.  No infection or ulcer.  Necrotic tissue noted left forefoot.  Second ulcer is under 2-4 metheads right foot.  Measures 20 mm. X 20 mm.  No drainage.  No infection.  This ulcer has malodor due to necrotic tissue .  No redness in either ulcer.     Assessment:     Diabetic ulcer B/L  2.  Diabetic neuropathy  3. Onychomycosis  Pain in both feet.    Plan:     IE  Debride necrotic tissue in ulcers both feet.  Neosporin/DSD.  Debridement of nails both feet.     Home instructions given.  Told him to wear surgical shoes.  Doxycycline ordered to prevent  Infection.

## 2015-06-07 NOTE — Progress Notes (Deleted)
   Subjective:    Patient ID: Dwayne [name], male    DOB: 02/20/1980, 35 y.o.   MRN: [id number]  HPI    Review of Systems  Eyes: Positive for visual disturbance.  Musculoskeletal: Positive for gait problem.  Skin:       Change in nails  Neurological: Positive for dizziness.       Objective:   Physical Exam        Assessment & Plan:

## 2015-06-16 ENCOUNTER — Encounter: Payer: Self-pay | Admitting: Podiatry

## 2015-06-16 ENCOUNTER — Ambulatory Visit (INDEPENDENT_AMBULATORY_CARE_PROVIDER_SITE_OTHER): Payer: Medicaid Other | Admitting: Podiatry

## 2015-06-16 VITALS — BP 124/79 | HR 75 | Resp 18

## 2015-06-16 DIAGNOSIS — E1151 Type 2 diabetes mellitus with diabetic peripheral angiopathy without gangrene: Secondary | ICD-10-CM

## 2015-06-16 DIAGNOSIS — E10621 Type 1 diabetes mellitus with foot ulcer: Secondary | ICD-10-CM

## 2015-06-16 DIAGNOSIS — L97519 Non-pressure chronic ulcer of other part of right foot with unspecified severity: Secondary | ICD-10-CM | POA: Diagnosis not present

## 2015-06-16 DIAGNOSIS — L89891 Pressure ulcer of other site, stage 1: Secondary | ICD-10-CM

## 2015-06-16 DIAGNOSIS — L97529 Non-pressure chronic ulcer of other part of left foot with unspecified severity: Principal | ICD-10-CM

## 2015-06-16 NOTE — Progress Notes (Signed)
Subjective:     Patient ID: Dwayne [name], male   DOB: February 29, 1980, 35 y.o.   MRN: [id number]  HPIThis patient returns to office for examination of two ulcers on his forefeet B/L.  He says he is caring for his feet as directed but walks into the office with no bandages on his feet.  He says the drainage has stopped and the left foot is better than his right foot.     Review of Systems     Objective:   Physical Exam Objective: Review of past medical history, medications, social history and allergies were performed.  Vascular: Dorsalis pedis and posterior tibial pulses were palpable B/L, capillary refill was  WNL B/L, temperature gradient was WNL B/L   Skin:  Both forefeet ulcers have closed over nicely with hyperkeratotic roof.  No evidence of redness or swelling or infection.  No malo  Sensory: Phoebe Perch monifilament Diminished   Orthopedic: Orthopedic evaluation demonstrates all joints distal t ankle have full ROM without crepitus, muscle power WNL B/L     Assessment:     Diabetic ulcer B/L     Plan:     Debridement of ulcers B/L  Continue home care as directed.

## 2015-06-23 ENCOUNTER — Encounter (INDEPENDENT_AMBULATORY_CARE_PROVIDER_SITE_OTHER): Payer: Medicaid Other | Admitting: Ophthalmology

## 2015-06-23 DIAGNOSIS — H43813 Vitreous degeneration, bilateral: Secondary | ICD-10-CM | POA: Diagnosis not present

## 2015-06-23 DIAGNOSIS — I10 Essential (primary) hypertension: Secondary | ICD-10-CM

## 2015-06-23 DIAGNOSIS — H35033 Hypertensive retinopathy, bilateral: Secondary | ICD-10-CM

## 2015-06-23 DIAGNOSIS — E10351 Type 1 diabetes mellitus with proliferative diabetic retinopathy with macular edema: Secondary | ICD-10-CM

## 2015-06-23 DIAGNOSIS — E10311 Type 1 diabetes mellitus with unspecified diabetic retinopathy with macular edema: Secondary | ICD-10-CM | POA: Diagnosis not present

## 2015-06-23 DIAGNOSIS — H2513 Age-related nuclear cataract, bilateral: Secondary | ICD-10-CM | POA: Diagnosis not present

## 2015-06-27 ENCOUNTER — Ambulatory Visit (INDEPENDENT_AMBULATORY_CARE_PROVIDER_SITE_OTHER): Payer: Medicaid Other | Admitting: Internal Medicine

## 2015-06-27 ENCOUNTER — Encounter: Payer: Self-pay | Admitting: Internal Medicine

## 2015-06-27 VITALS — BP 113/76 | HR 76 | Temp 98.6°F | Ht 74.0 in | Wt 223.5 lb

## 2015-06-27 DIAGNOSIS — I330 Acute and subacute infective endocarditis: Secondary | ICD-10-CM

## 2015-06-27 DIAGNOSIS — B958 Unspecified staphylococcus as the cause of diseases classified elsewhere: Secondary | ICD-10-CM | POA: Diagnosis not present

## 2015-06-27 DIAGNOSIS — F32A Depression, unspecified: Secondary | ICD-10-CM

## 2015-06-27 DIAGNOSIS — F329 Major depressive disorder, single episode, unspecified: Secondary | ICD-10-CM

## 2015-06-27 NOTE — Progress Notes (Signed)
Patient ID: Dwayne [name], male   DOB: January 19, 1980, 35 y.o.   MRN: [id number]         [facility]  Patient Active Problem List   Diagnosis Date Noted  . Diabetic nephropathy associated with type 2 diabetes mellitus 05/23/2015  . Hypoglycemia 04/29/2015  . Hypertensive urgency 04/29/2015  . Chronic kidney disease (CKD), stage III (moderate) 04/29/2015  . Blurry vision 04/06/2015  . Orthostatic hypotension 03/31/2015  . Acute loss of vision 03/31/2015  . Encounter for therapeutic drug monitoring 02/28/2015  . S/P minimally-invasive mitral valve repair 02/18/2015  . Endocarditis   . Septic arthritis of shoulder, right   . Acute on chronic diastolic heart failure   . Facial numbness   . Vertebral osteomyelitis   . Chronic renal insufficiency, stage III (moderate) 02/07/2015  . Acute renal failure superimposed on stage 3 chronic kidney disease   . Bacterial endocarditis   . Edema   . HTN (hypertension)   . Pedal edema   . Right shoulder pain   . Arthritis, septic, shoulder   . Acute pericarditis   . Hypoalbuminemia 02/02/2015  . Anasarca 02/02/2015  . Acute diastolic CHF (congestive heart failure), NYHA class 3 02/02/2015  . DM (diabetes mellitus) type 2, uncontrolled, with ketoacidosis   . Hypertensive heart disease 01/24/2015  . Periodontal disease 01/24/2015  . PVD (peripheral vascular disease) 01/24/2015  . Septic embolism 01/24/2015  . Leukocytosis   . Congestive heart failure   . Discitis of thoracic region   . Back pain   . Poor dentition   . Severe mitral regurgitation 01/13/2015  . 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 Purulent pericarditis   . Acute on chronic renal insufficiency   . Bacteremia   . Septic joint of right shoulder region   . Infective myositis of left thigh   . Diabetic ulcer of left foot associated with type 2 diabetes mellitus   . Diabetic ulcer of right foot associated with type 2 diabetes mellitus   .  Osteomyelitis   . Shoulder pain   . Thigh pain, musculoskeletal   . Septic shock   . Staphylococcus aureus bacteremia with sepsis   . Smoker   . Elevated troponin   . Dehydration 01/09/2015  . Diarrhea 01/09/2015  . Diabetes type 2, uncontrolled 01/09/2015  . Hyperglycemia 01/09/2015  . Uncontrolled hypertension 01/09/2015  . Diabetic foot ulcers 01/09/2015  . Pain   . Essential hypertension, benign 01/18/2014  . Noncompliance 01/18/2014  . Diabetic foot infection 01/15/2014  . Tobacco abuse 01/15/2014  . Diabetes mellitus due to underlying condition with foot ulcer 01/15/2014    Patient's Medications  New Prescriptions   No medications on file  Previous Medications   AMLODIPINE (NORVASC) 10 MG TABLET    Take 1 tablet (10 mg total) by mouth daily.   CARVEDILOL (COREG) 3.125 MG TABLET    Take 1 tablet (3.125 mg total) by mouth 2 (two) times daily.   CLONIDINE (CATAPRES) 0.3 MG TABLET    Take 1 tablet (0.3 mg total) by mouth 3 (three) times daily.   CYCLOBENZAPRINE (FLEXERIL) 10 MG TABLET    Take 1 tablet (10 mg total) by mouth 2 (two) times daily as needed for muscle spasms.   FUROSEMIDE (LASIX) 40 MG TABLET    Take 1 tablet (40 mg total) by mouth 2 (two) times daily.   HYDRALAZINE (APRESOLINE) 50 MG TABLET    Take 1 tablet (50 mg total) by mouth 3 (three)  times daily.   INSULIN ASPART PROTAMINE- ASPART (NOVOLOG MIX 70/30) (70-30) 100 UNIT/ML INJECTION    Inject 0.14 mLs (14 Units total) into the skin 2 (two) times daily with a meal.   INSULIN GLARGINE (LANTUS SOLOSTAR) 100 UNIT/ML SOLOSTAR PEN    Inject 5 Units into the skin daily at 10 pm.   INSULIN SYRINGE-NEEDLE U-100 28G X 1/2" 0.5 ML MISC    Use as directed. Any generic available.   LANCETS (FREESTYLE) LANCETS    Any generic available. Use as instructed   POTASSIUM CHLORIDE SA (K-DUR,KLOR-CON) 20 MEQ TABLET    Take 2 tablets daily  Modified Medications   No medications on file  Discontinued Medications   DOXYCYCLINE  (VIBRAMYCIN) 50 MG CAPSULE    Take 2 capsules (100 mg total) by mouth 2 (two) times daily.    Subjective: [name] is in for his routine follow-up visit. He completed 2 and half months of anabolic therapy on 04/01/2015 for his staph aureus mitral valve endocarditis. He has not had any fever and he, somewhat grudgingly, admits that he is slowly improving. He states that he feels like his blood pressure and blood sugar have been under better control recently. However, he admits that he remains severely depressed.  He was seen at the [facility] on 06/07/2015 by Dr. Stacie AcresMayer. Office notes indicates that there were uninfected plantar ulcers on both feet. He was given a prescription for doxycycline to prevent infection but never had it filled. He has not had any drainage from the areas on the bottom of his feet. He has poor feeling in his feet and cannot see the bottom of his feet well.  He underwent laser eye surgery last week and has been [facility].  Review of Systems: Constitutional: positive for fatigue and malaise, negative for anorexia, chills, fevers, sweats and weight loss Eyes: as noted in history of present illness Ears, nose, mouth, throat, and face: negative Respiratory: negative Cardiovascular: negative Gastrointestinal: negative Genitourinary:negative  Past Medical History  Diagnosis Date  . Hypertension   . Acute renal failure   . Bacteremia     MSSA   . Diabetes type 2, uncontrolled 01/09/2015  . 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 Essential hypertension, benign 01/18/2014  . Infective myositis of left thigh   . Noncompliance 01/18/2014  . Septic shock   . Staphylococcus aureus bacteremia with sepsis   . Tobacco abuse 01/15/2014  . Mitral regurgitation 01/13/2015    Severe by TEE  . MSSA (methicillin susceptible Staphylococcus aureus)   . Type II diabetes mellitus     not controlled  . Diabetic foot infection 01/15/2014  . S/P  minimally-invasive mitral valve repair 02/18/2015    Complex valvuloplasty including bovine pericardial patch repair of perforation of posterior leaflet with 28 mm Sorin Memo 3D ring annuloplasty via right mini thoracotomy approach     History  Substance Use Topics  . Smoking status: Current Every Day Smoker -- 12 years    Types: Cigars  . Smokeless tobacco: Never Used     Comment: black and mild cigars  . Alcohol Use: No    Family History  Problem Relation Age of Onset  . Heart attack Neg Hx   . Stroke Maternal Uncle   . Hypertension Mother   . Diabetes Brother     No Known Allergies  Objective: Temp: 98.6 F (37 C) (06/27 1556) Temp Source: Oral (06/27 1556) BP: 113/76 mmHg (06/27 1556)  Pulse Rate: 76 (06/27 1556)  General: His weight is stable at 223.5 pounds Skin: No rash Lungs: Clear Cor: Regular S1 and S2 with no murmurs. He has crisp valve sounds. Abdomen: Soft and nontender Feet: He has plantar calluses on both feet but no open ulcers at this time. There is no drainage or surrounding cellulitis. Mood: His affect remains very flat but he actually is slightly more animated and interactive than on previous visits.    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 I will continue observation off of antibiotics.  He has no evidence of diabetic foot infection and I do not feel that he needs to start doxycycline now.  He remains quite depressed after his prolonged hospitalization and recent surgery. I believe that he could benefit from mental health counseling and anti-depressive therapy.  Plan: 1. Observe off of antibiotics 2. I asked him to schedule a follow-up visit with Dr. Hyman Hopes, his primary care physician to discuss management of his depression in addition to routine follow-up for his chronic medical conditions 3. Follow-up here as needed   Cliffton Asters, MD [facility] [phone] pager    [phone] cell 06/27/2015, 4:50 PM

## 2015-06-30 DIAGNOSIS — Z0271 Encounter for disability determination: Secondary | ICD-10-CM

## 2015-08-08 ENCOUNTER — Telehealth: Payer: Self-pay | Admitting: Internal Medicine

## 2015-08-08 NOTE — Telephone Encounter (Signed)
Patient is in need of a referral for ophthamology with multiple visits.  [facility] [place] [place] [phone] Fax [phone] Patient needs to be referred by pcp, appointment has been scheduled with pcp, but patient would like to know if it could be done sooner. Please follow up with patient.

## 2015-08-18 ENCOUNTER — Ambulatory Visit: Payer: Medicaid Other | Attending: Internal Medicine | Admitting: Internal Medicine

## 2015-08-18 ENCOUNTER — Encounter: Payer: Self-pay | Admitting: Internal Medicine

## 2015-08-18 VITALS — BP 144/89 | HR 83 | Temp 98.6°F | Resp 18 | Ht 74.0 in | Wt 222.8 lb

## 2015-08-18 DIAGNOSIS — I10 Essential (primary) hypertension: Secondary | ICD-10-CM

## 2015-08-18 DIAGNOSIS — H538 Other visual disturbances: Secondary | ICD-10-CM | POA: Diagnosis not present

## 2015-08-18 DIAGNOSIS — E1169 Type 2 diabetes mellitus with other specified complication: Secondary | ICD-10-CM | POA: Diagnosis not present

## 2015-08-18 DIAGNOSIS — Z794 Long term (current) use of insulin: Secondary | ICD-10-CM | POA: Insufficient documentation

## 2015-08-18 DIAGNOSIS — L97519 Non-pressure chronic ulcer of other part of right foot with unspecified severity: Secondary | ICD-10-CM | POA: Insufficient documentation

## 2015-08-18 DIAGNOSIS — E11621 Type 2 diabetes mellitus with foot ulcer: Secondary | ICD-10-CM | POA: Diagnosis not present

## 2015-08-18 DIAGNOSIS — E118 Type 2 diabetes mellitus with unspecified complications: Secondary | ICD-10-CM

## 2015-08-18 DIAGNOSIS — I340 Nonrheumatic mitral (valve) insufficiency: Secondary | ICD-10-CM | POA: Diagnosis not present

## 2015-08-18 DIAGNOSIS — L84 Corns and callosities: Secondary | ICD-10-CM | POA: Diagnosis not present

## 2015-08-18 DIAGNOSIS — I129 Hypertensive chronic kidney disease with stage 1 through stage 4 chronic kidney disease, or unspecified chronic kidney disease: Secondary | ICD-10-CM | POA: Insufficient documentation

## 2015-08-18 DIAGNOSIS — Z79899 Other long term (current) drug therapy: Secondary | ICD-10-CM | POA: Insufficient documentation

## 2015-08-18 DIAGNOSIS — N189 Chronic kidney disease, unspecified: Secondary | ICD-10-CM | POA: Insufficient documentation

## 2015-08-18 DIAGNOSIS — L97529 Non-pressure chronic ulcer of other part of left foot with unspecified severity: Secondary | ICD-10-CM | POA: Insufficient documentation

## 2015-08-18 DIAGNOSIS — E11628 Type 2 diabetes mellitus with other skin complications: Secondary | ICD-10-CM | POA: Insufficient documentation

## 2015-08-18 DIAGNOSIS — L089 Local infection of the skin and subcutaneous tissue, unspecified: Secondary | ICD-10-CM

## 2015-08-18 LAB — POCT GLYCOSYLATED HEMOGLOBIN (HGB A1C): Hemoglobin A1C: 12.8

## 2015-08-18 LAB — GLUCOSE, POCT (MANUAL RESULT ENTRY): POC Glucose: 372 mg/dl — AB (ref 70–99)

## 2015-08-18 LAB — POCT URINALYSIS DIPSTICK
Bilirubin, UA: NEGATIVE
Glucose, UA: 500
KETONES UA: NEGATIVE
Leukocytes, UA: NEGATIVE
Nitrite, UA: NEGATIVE
PH UA: 5.5
SPEC GRAV UA: 1.02
UROBILINOGEN UA: 0.2

## 2015-08-18 MED ORDER — CIPROFLOXACIN HCL 500 MG PO TABS: 500.0000 mg | ORAL_TABLET | Freq: Two times a day (BID) | ORAL | Status: DC

## 2015-08-18 MED ORDER — INSULIN PEN NEEDLE 31G X 5 MM MISC: Status: DC

## 2015-08-18 MED ORDER — CLINDAMYCIN HCL 300 MG PO CAPS: 300.0000 mg | ORAL_CAPSULE | Freq: Three times a day (TID) | ORAL | Status: DC

## 2015-08-18 NOTE — Progress Notes (Signed)
Patient ID: Dwayne [name], male   DOB: 1980-12-16, 35 y.o.   MRN: [id number]   Dwayne [name], is a 35 y.o. male  [id number]  [id number]  DOB - Sep 19, 1980  Chief Complaint  Patient presents with  . Referral        Subjective:   Dwayne [name] is a 35 y.o. male here today for a follow up visit. 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 He is present with his sister who lives out of town, came recently on a visit and so that the patient has multiple ulcers and calluses on his feet bilaterally. Patient is not able to feel any pain because of severe diabetic neuropathy. He claims compliant with medication but has run out of most of them, needing refills, his not taking Lantus insulin because he does not have need this for the pen. Patient currently follows up with cardiologist, infectious disease, podiatrist. He is depressed but denies suicidal ideation or thoughts. Patient has no fever, denies any drainage from the feet. Patient has No headache, No chest pain, No abdominal pain - No Nausea, No new weakness tingling or numbness, No Cough - SOB.  Problem  Pre-Ulcerative Calluses  Diabetic Infection of Right Foot  Type 2 Diabetes Mellitus With Complications  Essential Hypertension    ALLERGIES: No Known Allergies  PAST MEDICAL HISTORY: Past Medical History  Diagnosis Date  . Hypertension   . Acute renal failure   . Bacteremia     MSSA   . Diabetes type 2, uncontrolled 01/09/2015  . 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 Essential hypertension, benign 01/18/2014  . Infective myositis of left thigh   . Noncompliance 01/18/2014  . Septic shock   . Staphylococcus  aureus bacteremia with sepsis   . Tobacco abuse 01/15/2014  . Mitral regurgitation 01/13/2015    Severe by TEE  . MSSA (methicillin susceptible Staphylococcus aureus)   . Type II diabetes mellitus     not controlled  . Diabetic foot infection 01/15/2014  . 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 Authorizing Provider  amLODipine (NORVASC) 10 MG tablet Take 1 tablet (10 mg total) by mouth daily. 05/12/15  Yes Vesta Mixer, MD  carvedilol (COREG) 3.125 MG tablet Take 1 tablet (3.125 mg total) by mouth 2 (two) times daily. 05/12/15  Yes Vesta Mixer, MD  cloNIDine (CATAPRES) 0.3 MG tablet Take 1 tablet (0.3 mg total) by mouth 3 (three) times daily. 05/23/15  Yes Quentin Angst, MD  furosemide (LASIX) 40 MG tablet Take 1 tablet (40 mg total) by mouth 2 (two) times daily. 05/23/15  Yes Quentin Angst, MD  hydrALAZINE (APRESOLINE) 50 MG tablet Take 1 tablet (50 mg total) by mouth 3 (three) times daily. 05/23/15  Yes Demesha Boorman Annitta Needs, MD  insulin aspart protamine- aspart (NOVOLOG MIX 70/30) (70-30) 100 UNIT/ML injection Inject 0.14 mLs (14 Units total) into the skin 2 (two) times daily with a meal. 05/23/15  Yes Quentin Angst, MD  Insulin Glargine (  LANTUS SOLOSTAR) 100 UNIT/ML Solostar Pen Inject 5 Units into the skin daily at 10 pm. 05/23/15  Yes Gasper Hopes E Hyman Hopes, MD  Insulin Syringe-Needle U-100 28G X 1/2" 0.5 ML MISC Use as directed. Any generic available. 01/28/15  Yes [name], DO  Lancets (FREESTYLE) lancets Any generic available. Use as instructed 01/28/15  Yes [name], DO  potassium chloride SA (K-DUR,KLOR-CON) 20 MEQ tablet Take 2 tablets daily 05/05/15  Yes Jaclyn Shaggy,  MD  ciprofloxacin (CIPRO) 500 MG tablet Take 1 tablet (500 mg total) by mouth 2 (two) times daily. 08/18/15   Quentin Angst, MD  clindamycin (CLEOCIN) 300 MG capsule Take 1 capsule (300 mg total) by mouth 3 (three) times daily. 08/18/15   Quentin Angst, MD  cyclobenzaprine (FLEXERIL) 10 MG tablet Take 1 tablet (10 mg total) by mouth 2 (two) times daily as needed for muscle spasms. Patient not taking: Reported on 08/18/2015 04/15/15   [name], PA-C     Objective:   Filed Vitals:   08/18/15 1107  BP: 144/89  Pulse: 83  Temp: 98.6 F (37 C)  TempSrc: Oral  Resp: 18  Height: 6\' 2"  (1.88 m)  Weight: 222 lb 12.8 oz (101.061 kg)  SpO2: 100%    Exam General appearance : Awake, alert, not in any distress. Speech Clear. Not toxic looking HEENT: Atraumatic and Normocephalic, pupils equally reactive to light and accomodation Neck: supple, no JVD. No cervical lymphadenopathy.  Chest:Good air entry bilaterally, no added sounds  CVS: S1 S2 regular, no murmurs.  Abdomen: Bowel sounds present, Non tender and not distended with no gaurding, rigidity or rebound. Extremities: There is significant necrotic tissue formation both feet. There is ulcer formation under fourth metatarsal left foot. The right foot reveals necrotic tissue sub 2-4 right foot. No drainage noted. No redness swelling or drainage noted right forefoot. No malodor noted.  Neurology: Awake alert, and oriented X 3, CN II-XII intact, Non focal Skin:No Rash  Data Review Lab Results  Component Value Date   HGBA1C 12.80 08/18/2015   HGBA1C 7.0 03/08/2015   HGBA1C 6.8* 02/18/2015     Assessment & Plan   1. Type 2 diabetes mellitus with complications  - Glucose (CBG) - HgB A1c - POCT urinalysis dipstick  CPP to teach patient today  Aim for 30 minutes of exercise most days. Rethink what you drink. Water is great! Aim for 2-3 Carb Choices per meal (30-45 grams) +/- 1 either way  Aim for 0-15 Carbs per  snack if hungry  Include protein in moderation with your meals and snacks  Consider reading food labels for Total Carbohydrate and Fat Grams of foods  Consider checking BG at alternate times per day  Continue taking medication as directed Be mindful about how much sugar you are adding to beverages and other foods. Try to decrease. Consider splenda. Fruit Punch - find one with no sugar  Measure and decrease portions of carbohydrate foods  Make your plate and don't go back for seconds   2. Diabetic infection of right foot  - Ambulatory referral to Podiatry - clindamycin (CLEOCIN) 300 MG capsule; Take 1 capsule (300 mg total) by mouth 3 (three) times daily.  Dispense: 30 capsule; Refill: 0 - ciprofloxacin (CIPRO) 500 MG tablet; Take 1 tablet (500 mg total) by mouth 2 (two) times daily.  Dispense: 20 tablet; Refill: 0  3. Pre-ulcerative calluses  - Ambulatory referral to Podiatry  4. Blurry vision  - Ambulatory referral to Ophthalmology  5.  Essential hypertension  - We have discussed target BP range and blood pressure goal - I have advised patient to check BP regularly and to call us back or report to clinic if the numbers are consistently higher than 140/90  - We discussed the importance of compliance with medical therapy and DASH diet recommended, consequences of uncontrolled hypertension discussed.  - continue current BP medications   Patient have been counseled extensively about nutrition and exercise Return in about 2 weeks (around 09/01/2015) for CBG, Lab/Nurse Visit, BP Check, Nurse Visit.  The patient was given clear instructions to go to ER or return to medical center if symptoms don't improve, worsen or new problems develop. The patient verbalized understanding. The patient was told to call to get lab results if they haven't heard anything in the next week.   This note has been created with Education officer, environmental. Any transcriptional  errors are unintentional.    Jeanann Lewandowsky, MD, MHA, CPE, FACP, FAAP [facility] [place], [place] [phone]   08/18/2015, 12:09 PM

## 2015-08-18 NOTE — Progress Notes (Signed)
Patient here for referral for eye surgery and podiatry. Patient CBG is 372. Pt ate about 30 minutes before arriving. Pt ate a breakfast bowl. Pt has taken 14 units of insulin today right before eating.   Pt denies any pain today. Pt reports that he is having issues with ulcers on his left foot and would like a referral.   Pt has not been taking lantus since prescribed due to not having needles for the pen. Patient has taken morning medications today.

## 2015-08-18 NOTE — Patient Instructions (Signed)
Diabetes and Foot Care Diabetes may cause you to have problems because of poor blood supply (circulation) to your feet and legs. This may cause the skin on your feet to become thinner, break easier, and heal more slowly. Your skin may become dry, and the skin may peel and crack. You may also have nerve damage in your legs and feet causing decreased feeling in them. You may not notice minor injuries to your feet that could lead to infections or more serious problems. Taking care of your feet is one of the most important things you can do for yourself.  HOME CARE INSTRUCTIONS  Wear shoes at all times, even in the house. Do not go barefoot. Bare feet are easily injured.  Check your feet daily for blisters, cuts, and redness. If you cannot see the bottom of your feet, use a mirror or ask someone for help.  Wash your feet with warm water (do not use hot water) and mild soap. Then pat your feet and the areas between your toes until they are completely dry. Do not soak your feet as this can dry your skin.  Apply a moisturizing lotion or petroleum jelly (that does not contain alcohol and is unscented) to the skin on your feet and to dry, brittle toenails. Do not apply lotion between your toes.  Trim your toenails straight across. Do not dig under them or around the cuticle. File the edges of your nails with an emery board or nail file.  Do not cut corns or calluses or try to remove them with medicine.  Wear clean socks or stockings every day. Make sure they are not too tight. Do not wear knee-high stockings since they may decrease blood flow to your legs.  Wear shoes that fit properly and have enough cushioning. To break in new shoes, wear them for just a few hours a day. This prevents you from injuring your feet. Always look in your shoes before you put them on to be sure there are no objects inside.  Do not cross your legs. This may decrease the blood flow to your feet.  If you find a minor scrape,  cut, or break in the skin on your feet, keep it and the skin around it clean and dry. These areas may be cleansed with mild soap and water. Do not cleanse the area with peroxide, alcohol, or iodine.  When you remove an adhesive bandage, be sure not to damage the skin around it.  If you have a wound, look at it several times a day to make sure it is healing.  Do not use heating pads or hot water bottles. They may burn your skin. If you have lost feeling in your feet or legs, you may not know it is happening until it is too late.  Make sure your health care provider performs a complete foot exam at least annually or more often if you have foot problems. Report any cuts, sores, or bruises to your health care provider immediately. SEEK MEDICAL CARE IF:   You have an injury that is not healing.  You have cuts or breaks in the skin.  You have an ingrown nail.  You notice redness on your legs or feet.  You feel burning or tingling in your legs or feet.  You have pain or cramps in your legs and feet.  Your legs or feet are numb.  Your feet always feel cold. SEEK IMMEDIATE MEDICAL CARE IF:   There is increasing redness,   swelling, or pain in or around a wound.  There is a red line that goes up your leg.  Pus is coming from a wound.  You develop a fever or as directed by your health care provider.  You notice a bad smell coming from an ulcer or wound. Document Released: 12/14/2000 Document Revised: 08/19/2013 Document Reviewed: 05/26/2013 ExitCare Patient Information 2015 [employer]. This information is not intended to replace advice given to you by your health care provider. Make sure you discuss any questions you have with your health care provider. Diabetes and Exercise Exercising regularly is important. It is not just about losing weight. It has many health benefits, such as:  Improving your overall fitness, flexibility, and endurance.  Increasing your bone  density.  Helping with weight control.  Decreasing your body fat.  Increasing your muscle strength.  Reducing stress and tension.  Improving your overall health. People with diabetes who exercise gain additional benefits because exercise:  Reduces appetite.  Improves the body's use of blood sugar (glucose).  Helps lower or control blood glucose.  Decreases blood pressure.  Helps control blood lipids (such as cholesterol and triglycerides).  Improves the body's use of the hormone insulin by:  Increasing the body's insulin sensitivity.  Reducing the body's insulin needs.  Decreases the risk for heart disease because exercising:  Lowers cholesterol and triglycerides levels.  Increases the levels of good cholesterol (such as high-density lipoproteins [HDL]) in the body.  Lowers blood glucose levels. YOUR ACTIVITY PLAN  Choose an activity that you enjoy and set realistic goals. Your health care provider or diabetes educator can help you make an activity plan that works for you. Exercise regularly as directed by your health care provider. This includes:  Performing resistance training twice a week such as push-ups, sit-ups, lifting weights, or using resistance bands.  Performing 150 minutes of cardio exercises each week such as walking, running, or playing sports.  Staying active and spending no more than 90 minutes at one time being inactive. Even short bursts of exercise are good for you. Three 10-minute sessions spread throughout the day are just as beneficial as a single 30-minute session. Some exercise ideas include:  Taking the dog for a walk.  Taking the stairs instead of the elevator.  Dancing to your favorite song.  Doing an exercise video.  Doing your favorite exercise with a friend. RECOMMENDATIONS FOR EXERCISING WITH TYPE 1 OR TYPE 2 DIABETES   Check your blood glucose before exercising. If blood glucose levels are greater than 240 mg/dL, check for urine  ketones. Do not exercise if ketones are present.  Avoid injecting insulin into areas of the body that are going to be exercised. For example, avoid injecting insulin into:  The arms when playing tennis.  The legs when jogging.  Keep a record of:  Food intake before and after you exercise.  Expected peak times of insulin action.  Blood glucose levels before and after you exercise.  The type and amount of exercise you have done.  Review your records with your health care provider. Your health care provider will help you to develop guidelines for adjusting food intake and insulin amounts before and after exercising.  If you take insulin or oral hypoglycemic agents, watch for signs and symptoms of hypoglycemia. They include:  Dizziness.  Shaking.  Sweating.  Chills.  Confusion.  Drink plenty of water while you exercise to prevent dehydration or heat stroke. Body water is lost during exercise and must be   replaced.  Talk to your health care provider before starting an exercise program to make sure it is safe for you. Remember, almost any type of activity is better than none. Document Released: 03/08/2004 Document Revised: 05/03/2014 Document Reviewed: 05/26/2013 ExitCare Patient Information 2015 [employer]. This information is not intended to replace advice given to you by your health care provider. Make sure you discuss any questions you have with your health care provider. Basic Carbohydrate Counting for Diabetes Mellitus Carbohydrate counting is a method for keeping track of the amount of carbohydrates you eat. Eating carbohydrates naturally increases the level of sugar (glucose) in your blood, so it is important for you to know the amount that is okay for you to have in every meal. Carbohydrate counting helps keep the level of glucose in your blood within normal limits. The amount of carbohydrates allowed is different for every person. A dietitian can help you calculate the  amount that is right for you. Once you know the amount of carbohydrates you can have, you can count the carbohydrates in the foods you want to eat. Carbohydrates are found in the following foods:  Grains, such as breads and cereals.  Dried beans and soy products.  Starchy vegetables, such as potatoes, peas, and corn.  Fruit and fruit juices.  Milk and yogurt.  Sweets and snack foods, such as cake, cookies, candy, chips, soft drinks, and fruit drinks. CARBOHYDRATE COUNTING There are two ways to count the carbohydrates in your food. You can use either of the methods or a combination of both. Reading the "Nutrition Facts" on Packaged Food The "Nutrition Facts" is an area that is included on the labels of almost all packaged food and beverages in the [place]. It includes the serving size of that food or beverage and information about the nutrients in each serving of the food, including the grams (g) of carbohydrate per serving.  Decide the number of servings of this food or beverage that you will be able to eat or drink. Multiply that number of servings by the number of grams of carbohydrate that is listed on the label for that serving. The total will be the amount of carbohydrates you will be having when you eat or drink this food or beverage. Learning Standard Serving Sizes of Food When you eat food that is not packaged or does not include "Nutrition Facts" on the label, you need to measure the servings in order to count the amount of carbohydrates.A serving of most carbohydrate-rich foods contains about 15 g of carbohydrates. The following list includes serving sizes of carbohydrate-rich foods that provide 15 g ofcarbohydrate per serving:   1 slice of bread (1 oz) or 1 six-inch tortilla.    of a hamburger bun or English muffin.  4-6 crackers.   cup unsweetened dry cereal.    cup hot cereal.   cup rice or pasta.    cup mashed potatoes or  of a large baked potato.  1  cup fresh fruit or one small piece of fruit.    cup canned or frozen fruit or fruit juice.  1 cup milk.   cup plain fat-free yogurt or yogurt sweetened with artificial sweeteners.   cup cooked dried beans or starchy vegetable, such as peas, corn, or potatoes.  Decide the number of standard-size servings that you will eat. Multiply that number of servings by 15 (the grams of carbohydrates in that serving). For example, if you eat 2 cups of strawberries, you will have eaten   2 servings and 30 g of carbohydrates (2 servings x 15 g = 30 g). For foods such as soups and casseroles, in which more than one food is mixed in, you will need to count the carbohydrates in each food that is included. EXAMPLE OF CARBOHYDRATE COUNTING Sample Dinner  3 oz chicken breast.   cup of brown rice.   cup of corn.  1 cup milk.   1 cup strawberries with sugar-free whipped topping.  Carbohydrate Calculation Step 1: Identify the foods that contain carbohydrates:   Rice.   Corn.   Milk.   Strawberries. Step 2:Calculate the number of servings eaten of each:   2 servings of rice.   1 serving of corn.   1 serving of milk.   1 serving of strawberries. Step 3: Multiply each of those number of servings by 15 g:   2 servings of rice x 15 g = 30 g.   1 serving of corn x 15 g = 15 g.   1 serving of milk x 15 g = 15 g.   1 serving of strawberries x 15 g = 15 g. Step 4: Add together all of the amounts to find the total grams of carbohydrates eaten: 30 g + 15 g + 15 g + 15 g = 75 g. Document Released: 12/17/2005 Document Revised: 05/03/2014 Document Reviewed: 11/13/2013 ExitCare Patient Information 2015 [employer]. This information is not intended to replace advice given to you by your health care provider. Make sure you discuss any questions you have with your health care provider.  

## 2015-08-23 ENCOUNTER — Ambulatory Visit (INDEPENDENT_AMBULATORY_CARE_PROVIDER_SITE_OTHER): Payer: Medicaid Other | Admitting: Podiatry

## 2015-08-23 ENCOUNTER — Encounter: Payer: Self-pay | Admitting: Podiatry

## 2015-08-23 VITALS — BP 178/101 | HR 92 | Resp 12

## 2015-08-23 DIAGNOSIS — L97421 Non-pressure chronic ulcer of left heel and midfoot limited to breakdown of skin: Secondary | ICD-10-CM

## 2015-08-23 DIAGNOSIS — L97509 Non-pressure chronic ulcer of other part of unspecified foot with unspecified severity: Secondary | ICD-10-CM

## 2015-08-23 DIAGNOSIS — E1041 Type 1 diabetes mellitus with diabetic mononeuropathy: Secondary | ICD-10-CM

## 2015-08-23 DIAGNOSIS — E10621 Type 1 diabetes mellitus with foot ulcer: Secondary | ICD-10-CM

## 2015-08-23 DIAGNOSIS — E08621 Diabetes mellitus due to underlying condition with foot ulcer: Secondary | ICD-10-CM

## 2015-08-23 DIAGNOSIS — L97519 Non-pressure chronic ulcer of other part of right foot with unspecified severity: Secondary | ICD-10-CM

## 2015-08-23 DIAGNOSIS — L97529 Non-pressure chronic ulcer of other part of left foot with unspecified severity: Principal | ICD-10-CM

## 2015-08-23 NOTE — Progress Notes (Signed)
Subjective:     Patient ID: Dwayne [name], male   DOB: 01/22/1980, 35 y.o.   MRN: [id number]  HPIThis patient presents to the office with painful thickened callus on the forefeet both feet.  He was given antibiotics by his medical doctor four days ago.  He was told to come to the office today for treatment of his feet.  He was treated in June for infected diabetic ulcers  by myself but his forefeet have broken down again.  He denies any drainage at this time.  He presents not wearing any bandage on his feet.   Review of Systems     Objective:   Physical Exam GENERAL APPEARANCE: Alert, conversant. Appropriately groomed. No acute distress.  VASCULAR: Pedal pulses palpable at  [facility] and PT bilateral.  Capillary refill time is immediate to all digits,  Normal temperature gradient.  Digital hair growth is present bilateral  NEUROLOGIC: sensation to feet markedly diminished  to 5.07 monofilament at 5/5 sites bilateral.  Light touch is intact bilateral, Muscle strength normal.  MUSCULOSKELETAL: acceptable muscle strength, tone and stability bilateral.  Intrinsic muscluature intact bilateral.  Rectus appearance of foot and digits noted bilateral.   DERMATOLOGIC: There is significant necrotic tissue formation both feet.  There is  ulcer formation under fourth metatarsal left foot.  Debridement of the necrotic tissue reveals ulcer formation with no drainage redness or infection.  The right foot reveals necrotic tissue sub 2-4 right foot.  No drainage noted.  No redness swelling or drainage noted right forefoot.  No malodor noted.      Assessment:     Diabetic Ulcer B/L  Diabetic Neuropathy     Plan:     ROV  Debride necrotic tissue.  Neosporin /DSD left foot.  Home soaks recommended with bandaging to both forefeet.   Continue antibiotics until completed.  RTC 1 week.

## 2015-08-31 ENCOUNTER — Encounter: Payer: Self-pay | Admitting: Podiatry

## 2015-08-31 ENCOUNTER — Ambulatory Visit (INDEPENDENT_AMBULATORY_CARE_PROVIDER_SITE_OTHER): Payer: Medicaid Other | Admitting: Podiatry

## 2015-08-31 VITALS — BP 128/82 | HR 71 | Resp 17

## 2015-08-31 DIAGNOSIS — E10621 Type 1 diabetes mellitus with foot ulcer: Secondary | ICD-10-CM

## 2015-08-31 DIAGNOSIS — B351 Tinea unguium: Secondary | ICD-10-CM

## 2015-08-31 DIAGNOSIS — L97519 Non-pressure chronic ulcer of other part of right foot with unspecified severity: Secondary | ICD-10-CM

## 2015-08-31 DIAGNOSIS — L97529 Non-pressure chronic ulcer of other part of left foot with unspecified severity: Secondary | ICD-10-CM

## 2015-08-31 DIAGNOSIS — M79673 Pain in unspecified foot: Secondary | ICD-10-CM

## 2015-08-31 DIAGNOSIS — E1165 Type 2 diabetes mellitus with hyperglycemia: Secondary | ICD-10-CM

## 2015-08-31 DIAGNOSIS — E08621 Diabetes mellitus due to underlying condition with foot ulcer: Secondary | ICD-10-CM

## 2015-08-31 DIAGNOSIS — E1041 Type 1 diabetes mellitus with diabetic mononeuropathy: Secondary | ICD-10-CM

## 2015-08-31 DIAGNOSIS — IMO0002 Reserved for concepts with insufficient information to code with codable children: Secondary | ICD-10-CM

## 2015-08-31 DIAGNOSIS — L97509 Non-pressure chronic ulcer of other part of unspecified foot with unspecified severity: Secondary | ICD-10-CM

## 2015-08-31 NOTE — Progress Notes (Signed)
Subjective:     Patient ID: Dwayne [name], male   DOB: 1980-06-21, 35 y.o.   MRN: [id number]  HPIThis patient presents to the office for continued evaluation and treatment of his feet.  He is diabetic with severe neuropathy.  He was seen last week and bandages were applied to both feet and he was told to continue his antibiotics.  He presents tody for continued evaluation and treatment.  He says he has tried remaining off his feet for hours to allow his feet to heal.   Review of Systems     Objective:   Physical Exam GENERAL APPEARANCE: Alert, conversant. Appropriately groomed. No acute distress.  VASCULAR: Pedal pulses palpable at  [facility] and PT bilateral.  Capillary refill time is immediate to all digits,  Normal temperature gradient.  Digital hair growth is present bilateral  NEUROLOGIC: sensation is absent  to 5.07 monofilament at 5/5 sites bilateral.  Light touch is intact bilateral, Muscle strength normal.  MUSCULOSKELETAL: acceptable muscle strength, tone and stability bilateral.  Intrinsic muscluature intact bilateral.  Rectus appearance of foot and digits noted bilateral.   DERMATOLOGIC: skin color, texture, and turgor are within normal limits. , no interdigital maceration noted.  No open lesions present.  Digital nails are thick disfigtred and discolored both feet.. No drainage noted.  Ulcers  Examination of right forefoot reveals healing of the previous ulcers under the third/fourth metatarsal right foot and under big toe joint right foot.  Examination of left forefoot reveals hyperkeratotic roof of callus over white necrotic tissue under the ball of 2,3,4 left foot.  No evidence of redness or swelling or drainage.  Malodor persists left foot due to necrotic tissue.    Assessment:     Diabetic Ulcer B/L  Diabetic neuropathy.    Plan:     Debridement of necrotic tissue left foot down to fat.  Neosporin/DSD was applied.  Continue soaks.  Recommended he pick up orthowedge shoe to wear  left foot.  RTC 10 days.

## 2015-09-08 ENCOUNTER — Ambulatory Visit: Payer: Medicaid Other | Admitting: Internal Medicine

## 2015-09-08 ENCOUNTER — Encounter: Payer: Self-pay | Admitting: Pharmacist

## 2015-09-09 ENCOUNTER — Encounter: Payer: Self-pay | Admitting: Podiatry

## 2015-09-09 ENCOUNTER — Ambulatory Visit (INDEPENDENT_AMBULATORY_CARE_PROVIDER_SITE_OTHER): Payer: Medicaid Other | Admitting: Podiatry

## 2015-09-09 VITALS — BP 159/69 | HR 86 | Resp 12

## 2015-09-09 DIAGNOSIS — E10621 Type 1 diabetes mellitus with foot ulcer: Secondary | ICD-10-CM

## 2015-09-09 DIAGNOSIS — E08621 Diabetes mellitus due to underlying condition with foot ulcer: Secondary | ICD-10-CM

## 2015-09-09 DIAGNOSIS — L97519 Non-pressure chronic ulcer of other part of right foot with unspecified severity: Secondary | ICD-10-CM

## 2015-09-09 DIAGNOSIS — L97529 Non-pressure chronic ulcer of other part of left foot with unspecified severity: Principal | ICD-10-CM

## 2015-09-09 DIAGNOSIS — B351 Tinea unguium: Secondary | ICD-10-CM

## 2015-09-09 DIAGNOSIS — L97509 Non-pressure chronic ulcer of other part of unspecified foot with unspecified severity: Secondary | ICD-10-CM

## 2015-09-09 NOTE — Progress Notes (Signed)
Subjective:     Patient ID: Dwayne [name], male   DOB: [birth year], 35 y.o.   MRN: [id number]  HPI This patient returns follow up diabetic ulcers on both forefeet.  He has continued to heal on his right foot..  No signs of drainage.  His left forefoot is healing with callus covering his ulcer.  He has not worn his orthowedge shoe but has been bandaging and soaking his left forefoot ulcer. There is no evidence of redness or swelling or infection.  Review of Systems     Objective:   Physical Exam GENERAL APPEARANCE: . Appropriately groomed. No acute distress.  VASCULAR: Pedal pulses palpable at  [facility] and PT bilateral.  Capillary refill time is immediate to all digits,  Normal temperature gradient.  Digital hair growth is present bilateral  NEUROLOGIC: sensation is absent  to 5.07 monofilament at 5/5 sites bilateral.  Light touch is intact bilateral, Muscle strength normal.  MUSCULOSKELETAL: acceptable muscle strength, tone and stability bilateral.  Intrinsic muscluature intact bilateral.  Rectus appearance of foot and digits noted bilateral.   DERMATOLOGIC: skin color, texture, and turgor are within normal limits.  , no interdigital maceration noted.  No open lesions present.  Digital nails are asymptomatic. No drainage noted. Ulcer  The right foot has two healing ulcers under first and third metatarsal heads right foot.  There is hyperkeratotic roof over the ulcer under 2,3,4 left foot.  No malodor noted.  No drainage from ulcer left foot.      Assessment:     Diabetic ulcer both forefeet.     Plan:     Debridement of skin tissue on his feet B/L.  Neosporin/DSD  Continue home soaks. Discussed his reappointment time and decided to evaluate every four weeks.

## 2015-09-13 ENCOUNTER — Ambulatory Visit: Payer: Medicaid Other | Attending: Internal Medicine | Admitting: Pharmacist

## 2015-09-13 DIAGNOSIS — Z794 Long term (current) use of insulin: Secondary | ICD-10-CM | POA: Diagnosis not present

## 2015-09-13 DIAGNOSIS — E118 Type 2 diabetes mellitus with unspecified complications: Secondary | ICD-10-CM | POA: Insufficient documentation

## 2015-09-13 MED ORDER — INSULIN ASPART 100 UNIT/ML FLEXPEN: 6.0000 [IU] | PEN_INJECTOR | Freq: Three times a day (TID) | SUBCUTANEOUS | Status: DC

## 2015-09-13 MED ORDER — INSULIN GLARGINE 100 UNIT/ML SOLOSTAR PEN: 20.0000 [IU] | PEN_INJECTOR | Freq: Every day | SUBCUTANEOUS | Status: DC

## 2015-09-13 NOTE — Progress Notes (Signed)
S:    Patient arrives with his wife Elmarie Shiley and seems to be in pain. Has difficulty walking and is rather quiet. Presents for diabetes management. Patient reports having history of Diabetes since the year of 2015.   Patient reports adherence with medications. Current diabetes medications include Novolog 70/30 14 units twice daily and Lantus 5 units once daily. His wife helps him manage his medications.   Patient denies hypoglycemic events however, experienced pseudohypoglycemia at a BG reading of 95. Patient did not remember to bring BG logs to visit.  Patient reported dietary habits: Avoids soda and other soft drinks but, will drink orange juice. Eats three meals daily; generally chicken and rice.  Patient reported exercise habits: Has not been exercising due to pain in leg.   Patient denies nocturia. Patient reports neuropathy. Patient reports visual changes.   Patient states he has an upcoming appointment for eye surgery and will likely be postponed due to poor blood glucose control.   O:  . Lab Results  Component Value Date   HGBA1C 12.80 08/18/2015    Patient forgot meter and log at home Reported fasting blood glucose: 200s Reported post-prandial glucose: 300s-500s   A/P: Diabetes diagnosed in 2015 currently uncontrolled based on A1c of 12.8. Denies hypoglycemic events and is able to verbalize appropriate hypoglycemia management plan. Reports adherence with medication. Currently on Lantus 5 units once daily along with Novolog 70/30 14 units twice daily.  Discontinued Novolog 70/30 and increased Lantus to 20 units once daily (this is below the total basal insulin he was getting with the Lantus and the 70/30 to prevent overshooting and causing hypoglycemia). I feel that it is unsafe for patient to be on two forms of basal insulin. Initiated Novolog at 6 units three times daily with meals (also slightly less than total patient was receiving from 70/30). Instructed patient to continue  checking BG and to call the clinic if numbers remain in the 200s or drop below 90. Will follow up with patient by phone in one week to assess for any issues. I expect that we will have to increase the doses of his insulin significantly but would rather be conservative in switch between insulins in case he is more sensitive to this new regimen. Extensive education provided on switch in insulins and blood glucose control. Patient to receive sample of Novolog from pharmacy and it will be covered under Medicaid.   Next A1C anticipated in November.  Written patient instructions provided.  Follow up in Pharmacist Clinic Visit 2 weeks. Total time in face to face counseling 40 minutes.  Patient seen with Sherle Poe, PharmD resident.

## 2015-09-13 NOTE — Patient Instructions (Signed)
Good to see you today!  -Start taking lantus 20 units each night. -Start taking novolog 6 units threee times each day with meals. DO NOT give novolog if not eating.  Continue to check blood sugars as you have been. If you notice numbers continuing to stay above 200 or see numbers below 80, call the clinic. Bring readings to next appointment  Follow up in the Pharmacy clinic Dwayne [name]) in 2 weeks.

## 2015-09-15 ENCOUNTER — Encounter: Payer: Self-pay | Admitting: Internal Medicine

## 2015-09-15 ENCOUNTER — Ambulatory Visit: Payer: Medicaid Other | Attending: Internal Medicine | Admitting: Internal Medicine

## 2015-09-15 VITALS — BP 136/90 | HR 75 | Temp 98.3°F | Resp 18 | Ht 74.0 in | Wt 223.8 lb

## 2015-09-15 DIAGNOSIS — I10 Essential (primary) hypertension: Secondary | ICD-10-CM | POA: Insufficient documentation

## 2015-09-15 DIAGNOSIS — I5021 Acute systolic (congestive) heart failure: Secondary | ICD-10-CM | POA: Insufficient documentation

## 2015-09-15 DIAGNOSIS — Z87891 Personal history of nicotine dependence: Secondary | ICD-10-CM | POA: Insufficient documentation

## 2015-09-15 DIAGNOSIS — G894 Chronic pain syndrome: Secondary | ICD-10-CM | POA: Diagnosis not present

## 2015-09-15 DIAGNOSIS — E108 Type 1 diabetes mellitus with unspecified complications: Secondary | ICD-10-CM | POA: Diagnosis not present

## 2015-09-15 LAB — GLUCOSE, POCT (MANUAL RESULT ENTRY): POC Glucose: 278 mg/dl — AB (ref 70–99)

## 2015-09-15 MED ORDER — INSULIN ASPART 100 UNIT/ML ~~LOC~~ SOLN
10.0000 [IU] | Freq: Once | SUBCUTANEOUS | Status: AC
  Administered 2015-09-15: 10 [IU] via SUBCUTANEOUS

## 2015-09-15 MED ORDER — AMLODIPINE BESYLATE 10 MG PO TABS: 10.0000 mg | ORAL_TABLET | Freq: Every day | ORAL | Status: DC

## 2015-09-15 MED ORDER — CYCLOBENZAPRINE HCL 10 MG PO TABS: 10.0000 mg | ORAL_TABLET | Freq: Two times a day (BID) | ORAL | Status: AC | PRN

## 2015-09-15 MED ORDER — POTASSIUM CHLORIDE CRYS ER 20 MEQ PO TBCR: EXTENDED_RELEASE_TABLET | ORAL | Status: AC

## 2015-09-15 MED ORDER — CLONIDINE HCL 0.3 MG PO TABS: 0.3000 mg | ORAL_TABLET | Freq: Three times a day (TID) | ORAL | Status: DC

## 2015-09-15 MED ORDER — ACETAMINOPHEN-CODEINE #3 300-30 MG PO TABS: 1.0000 | ORAL_TABLET | ORAL | Status: DC | PRN

## 2015-09-15 MED ORDER — HYDRALAZINE HCL 50 MG PO TABS: 50.0000 mg | ORAL_TABLET | Freq: Three times a day (TID) | ORAL | Status: DC

## 2015-09-15 MED ORDER — FUROSEMIDE 40 MG PO TABS: 40.0000 mg | ORAL_TABLET | Freq: Two times a day (BID) | ORAL | Status: DC

## 2015-09-15 MED ORDER — CARVEDILOL 3.125 MG PO TABS: 3.1250 mg | ORAL_TABLET | Freq: Two times a day (BID) | ORAL | Status: DC

## 2015-09-15 NOTE — Patient Instructions (Signed)
Diabetes and Exercise Exercising regularly is important. It is not just about losing weight. It has many health benefits, such as:  Improving your overall fitness, flexibility, and endurance.  Increasing your bone density.  Helping with weight control.  Decreasing your body fat.  Increasing your muscle strength.  Reducing stress and tension.  Improving your overall health. People with diabetes who exercise gain additional benefits because exercise:  Reduces appetite.  Improves the body's use of blood sugar (glucose).  Helps lower or control blood glucose.  Decreases blood pressure.  Helps control blood lipids (such as cholesterol and triglycerides).  Improves the body's use of the hormone insulin by:  Increasing the body's insulin sensitivity.  Reducing the body's insulin needs.  Decreases the risk for heart disease because exercising:  Lowers cholesterol and triglycerides levels.  Increases the levels of good cholesterol (such as high-density lipoproteins [HDL]) in the body.  Lowers blood glucose levels. YOUR ACTIVITY PLAN  Choose an activity that you enjoy and set realistic goals. Your health care provider or diabetes educator can help you make an activity plan that works for you. Exercise regularly as directed by your health care provider. This includes:  Performing resistance training twice a week such as push-ups, sit-ups, lifting weights, or using resistance bands.  Performing 150 minutes of cardio exercises each week such as walking, running, or playing sports.  Staying active and spending no more than 90 minutes at one time being inactive. Even short bursts of exercise are good for you. Three 10-minute sessions spread throughout the day are just as beneficial as a single 30-minute session. Some exercise ideas include:  Taking the dog for a walk.  Taking the stairs instead of the elevator.  Dancing to your favorite song.  Doing an exercise  video.  Doing your favorite exercise with a friend. RECOMMENDATIONS FOR EXERCISING WITH TYPE 1 OR TYPE 2 DIABETES   Check your blood glucose before exercising. If blood glucose levels are greater than 240 mg/dL, check for urine ketones. Do not exercise if ketones are present.  Avoid injecting insulin into areas of the body that are going to be exercised. For example, avoid injecting insulin into:  The arms when playing tennis.  The legs when jogging.  Keep a record of:  Food intake before and after you exercise.  Expected peak times of insulin action.  Blood glucose levels before and after you exercise.  The type and amount of exercise you have done.  Review your records with your health care provider. Your health care provider will help you to develop guidelines for adjusting food intake and insulin amounts before and after exercising.  If you take insulin or oral hypoglycemic agents, watch for signs and symptoms of hypoglycemia. They include:  Dizziness.  Shaking.  Sweating.  Chills.  Confusion.  Drink plenty of water while you exercise to prevent dehydration or heat stroke. Body water is lost during exercise and must be replaced.  Talk to your health care provider before starting an exercise program to make sure it is safe for you. Remember, almost any type of activity is better than none. Document Released: 03/08/2004 Document Revised: 05/03/2014 Document Reviewed: 05/26/2013 ExitCare Patient Information 2015 [employer]. This information is not intended to replace advice given to you by your health care provider. Make sure you discuss any questions you have with your health care provider.  

## 2015-09-15 NOTE — Progress Notes (Signed)
Patient ID: Dwayne [name], male   DOB: 1980-09-12, 35 y.o.   MRN: [id number]   Dwayne [name], is a 35 y.o. male  [id number]  NFA:[id number]  DOB - [birth year]  No chief complaint on file.       Subjective:   Dwayne [name] is a 35 y.o. male here today for a follow up visit. 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 Patient is here today with major complaints of generalized body pain most especially the scar at the site of chest tube insertion during her prolonged hospital stay earlier this year.  Patient denies shortness of breath, no cough, no fever. Patient is also requesting medication refills.Patient has No headache, No chest pain, No abdominal pain - No Nausea, No new weakness tingling or numbness. Patient usually has bilateral lower limb edema worse on ambulation all prolonged standing. He has diabetic ulcers on both forefeet, recently seen by podiatrist and treated with debridement , currently on dressing with Neosporin and home soaks.  No problems updated.  ALLERGIES: No Known Allergies  PAST MEDICAL HISTORY: Past Medical History  Diagnosis Date  . Hypertension   . Acute renal failure   . Bacteremia     MSSA   . Diabetes type 2, uncontrolled 01/09/2015  . 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 Essential hypertension, benign 01/18/2014  . Infective myositis of left thigh   . Noncompliance 01/18/2014  . Septic shock   . Staphylococcus aureus bacteremia with sepsis   . Tobacco abuse 01/15/2014  . Mitral regurgitation 01/13/2015    Severe by TEE  . MSSA (methicillin susceptible Staphylococcus aureus)   . Type II diabetes mellitus     not  controlled  . Diabetic foot infection 01/15/2014  . 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 Authorizing Provider  amLODipine (NORVASC) 10 MG tablet Take 1 tablet (10 mg total) by mouth daily. 09/15/15  Yes Quentin Angst, MD  carvedilol (COREG) 3.125 MG tablet Take 1 tablet (3.125 mg total) by mouth 2 (two) times daily. 09/15/15  Yes Quentin Angst, MD  ciprofloxacin (CIPRO) 500 MG tablet Take 1 tablet (500 mg total) by mouth 2 (two) times daily. 08/18/15  Yes Quentin Angst, MD  clindamycin (CLEOCIN) 300 MG capsule Take 1 capsule (300 mg total) by mouth 3 (three) times daily. 08/18/15  Yes Quentin Angst, MD  cloNIDine (CATAPRES) 0.3 MG tablet Take 1 tablet (0.3 mg total) by mouth 3 (three) times daily. 09/15/15  Yes Quentin Angst, MD  cyclobenzaprine (FLEXERIL) 10 MG tablet Take 1 tablet (10 mg total) by mouth 2 (two) times daily as needed for muscle spasms. 09/15/15  Yes Quentin Angst, MD  furosemide (LASIX) 40 MG tablet Take 1 tablet (40 mg total) by mouth 2 (two) times daily. 09/15/15  Yes Quentin Angst, MD  hydrALAZINE (APRESOLINE) 50 MG tablet Take 1 tablet (50 mg total) by mouth 3 (three) times daily. 09/15/15  Yes Quentin Angst, MD  insulin aspart (NOVOLOG FLEXPEN) 100 UNIT/ML FlexPen Inject 6 Units into the skin 3 (three) times daily with meals. 09/13/15  Yes Quentin Angst, MD  Insulin Glargine (LANTUS SOLOSTAR) 100 UNIT/ML Solostar Pen Inject 20 Units into the skin daily at 10 pm. 09/13/15  Yes Roanne Haye E Hyman Hopes, MD  Insulin Pen Needle (B-D UF III MINI PEN NEEDLES) 31G X 5 MM MISC Use one needle with each injection of Lantus Pen daily. 08/18/15  Yes  Quentin Angst, MD  Insulin Syringe-Needle U-100 28G X 1/2" 0.5 ML MISC Use as directed. Any generic available. 01/28/15  Yes [name], DO  Lancets (FREESTYLE) lancets Any generic available. Use as instructed 01/28/15  Yes [name], DO  potassium chloride SA (K-DUR,KLOR-CON) 20 MEQ tablet Take 2 tablets daily 09/15/15  Yes Andrew Soria E Hyman Hopes, MD  acetaminophen-codeine (TYLENOL #3) 300-30 MG per tablet Take 1 tablet by mouth every 4 (four) hours as needed. 09/15/15   Quentin Angst, MD     Objective:   Filed Vitals:   09/15/15 1438  BP: 136/90  Pulse: 75  Temp: 98.3 F (36.8 C)  TempSrc: Oral  Resp: 18  Height:  (1.88 m)  Weight: 223 lb 12.8 oz (101.515 kg)  SpO2: 100%    Exam General appearance : Awake, alert, not in any distress. Speech Clear. Not toxic looking HEENT: Atraumatic and Normocephalic, pupils equally reactive to light and accomodation Neck: supple, no JVD. No cervical lymphadenopathy.  Chest:Good air entry bilaterally, no added sounds  CVS: S1 S2 regular, no murmurs.  Abdomen: Bowel sounds present, Non tender and not distended with no gaurding, rigidity or rebound. Extremities: B/L Lower Ext shows edema ++, both legs are warm to touch, both feet are wrapped from recent podiatrist surgeries. Neurology: Awake alert, and oriented X 3, CN II-XII intact, Non focal Skin:No Rash  Data Review Lab Results  Component Value Date   HGBA1C 12.80 08/18/2015   HGBA1C 7.0 03/08/2015   HGBA1C 6.8* 02/18/2015     Assessment & Plan   1. Type I diabetes mellitus with complication - Glucose (CBG)  Aim for 30 minutes of exercise most days. Rethink what you drink. Water is great! Aim for 2-3 Carb Choices per meal (30-45 grams) +/- 1 either way  Aim for 0-15 Carbs per snack if hungry  Include protein in moderation with your meals and snacks  Consider reading food labels for Total Carbohydrate and Fat Grams of foods  Consider checking BG at  alternate times per day  Continue taking medication as directed Be mindful about how much sugar you are adding to beverages and other foods. Fruit Punch - find one with no sugar  Measure and decrease portions of carbohydrate foods  Make your plate and don't go back for seconds   2. Essential hypertension  - amLODipine (NORVASC) 10 MG tablet; Take 1 tablet (10 mg total) by mouth daily.  Dispense: 90 tablet; Refill: 3 - carvedilol (COREG) 3.125 MG tablet; Take 1 tablet (3.125 mg total) by mouth 2 (two) times daily.  Dispense: 180 tablet; Refill: 3 - cloNIDine (CATAPRES) 0.3 MG tablet; Take 1 tablet (0.3 mg total) by mouth 3 (three) times daily.  Dispense: 90 tablet; Refill: 3 - hydrALAZINE (APRESOLINE) 50 MG tablet; Take 1 tablet (50 mg total) by mouth 3 (three) times daily.  Dispense: 270 tablet; Refill: 3 - potassium chloride SA (K-DUR,KLOR-CON) 20 MEQ tablet; Take 2 tablets daily  Dispense: 60 tablet; Refill: 3  3. Acute systolic congestive  heart failure  - carvedilol (COREG) 3.125 MG tablet; Take 1 tablet (3.125 mg total) by mouth 2 (two) times daily.  Dispense: 180 tablet; Refill: 3 - furosemide (LASIX) 40 MG tablet; Take 1 tablet (40 mg total) by mouth 2 (two) times daily.  Dispense: 180 tablet; Refill: 3  4. Chronic pain syndrome  - cyclobenzaprine (FLEXERIL) 10 MG tablet; Take 1 tablet (10 mg total) by mouth 2 (two) times daily as needed for muscle spasms.  Dispense: 30 tablet; Refill: 3 - acetaminophen-codeine (TYLENOL #3) 300-30 MG per tablet; Take 1 tablet by mouth every 4 (four) hours as needed.  Dispense: 60 tablet; Refill: 0  Patient have been counseled extensively about nutrition and exercise  Return in about 2 weeks (around 09/29/2015) for CBG, Lab/Nurse Visit, BP Check, Nurse Visit.  The patient was given clear instructions to go to ER or return to medical center if symptoms don't improve, worsen or new problems develop. The patient verbalized understanding. The patient  was told to call to get lab results if they haven't heard anything in the next week.   This note has been created with Education officer, environmental. Any transcriptional errors are unintentional.    Jeanann Lewandowsky, MD, MHA, FACP, FAAP, CPE [facility] and [facility]ness [place], [place] [phone]   09/15/2015, 3:41 PM

## 2015-09-15 NOTE — Progress Notes (Signed)
Patient here for DM follow-up.  Patient complains of pain at a 2, right lower chest pain, around to his side.  Sleeping on the rightside makes the pain worse. Patient has taken medication this morning. Patient needs refills on potassium, lasix, carvedilol.

## 2015-09-27 ENCOUNTER — Telehealth: Payer: Self-pay | Admitting: Internal Medicine

## 2015-09-27 ENCOUNTER — Encounter: Payer: Self-pay | Admitting: Pharmacist

## 2015-09-27 NOTE — Telephone Encounter (Signed)
Patient called requesting a handicap sticker. Please f/u with pt.

## 2015-09-29 ENCOUNTER — Encounter: Payer: Self-pay | Admitting: Pharmacist

## 2015-09-29 ENCOUNTER — Other Ambulatory Visit: Payer: Self-pay

## 2015-10-05 ENCOUNTER — Ambulatory Visit: Payer: Medicaid Other | Admitting: Podiatry

## 2015-10-12 ENCOUNTER — Encounter: Payer: Self-pay | Admitting: Podiatry

## 2015-10-12 ENCOUNTER — Ambulatory Visit (INDEPENDENT_AMBULATORY_CARE_PROVIDER_SITE_OTHER): Payer: Medicaid Other | Admitting: Podiatry

## 2015-10-12 DIAGNOSIS — E10621 Type 1 diabetes mellitus with foot ulcer: Secondary | ICD-10-CM

## 2015-10-12 DIAGNOSIS — L97519 Non-pressure chronic ulcer of other part of right foot with unspecified severity: Secondary | ICD-10-CM | POA: Diagnosis not present

## 2015-10-12 DIAGNOSIS — L97529 Non-pressure chronic ulcer of other part of left foot with unspecified severity: Secondary | ICD-10-CM

## 2015-10-12 NOTE — Progress Notes (Signed)
Subjective:     Patient ID: Dwayne HickmanBenjamin [name], male   DOB: 12/03/1980, 35 y.o.   MRN: [id number]  HPIThis patient returns to the office for continued diabetic ulcer care both feet.  He has thickened callus noted under the center of his right foot.  He has callus under the ball of his left foot with no drainage.  He has developed an significant ulcer with significant amount of dead tissue covering the ulcer under the ball of his right foot.  There is drainage according to the nurse on the bandage she removed.  He says he walked to the office.     Review of Systems     Objective:   Physical Exam  Objective:   Physical Exam GENERAL APPEARANCE: . Appropriately groomed. No acute distress.  VASCULAR: [facility]al [facility] bilateral. Capillary refill time is immediate to all digits, Normal temperature gradient. Digital hair growth is present bilateral  NEUROLOGIC: sensation is absent to 5.07 monofilament at 5/5 sites bilateral. Light touch is intact bilateral, Muscle strength normal.  MUSCULOSKELETAL: acceptable muscle strength, tone and stability bilateral. Intrinsic muscluature intact bilateral. Rectus appearance of foot and digits noted bilateral.   DERMATOLOGIC: skin color, texture, and turgor are within normal limits. , no interdigital maceration noted. . Digital nails are asymptomatic. No drainage noted from nails. Ulcer The right foot has a  healing ulcer under  third metatarsal head right foot. There is significant breakdown of ulcer sub 1st MPJ right foot.  There is significant necrotic tissue over diabetic ulcer .  No evidence of redness or swelling or infection noted. This newly enlarged ulcer measures 40 mm. X 40 mm. There is hyperkeratotic roof over the ulcer under 2,3,4 left foot. No malodor noted. No drainage from ulcer left foot.            Assessment:     Diabetic ulcer right foot.  Callus noted ball of both feet.     Plan:     Drebridement of  necrotic tissue ulcer right foot.  Neosporin /DSD.  Debridement of callus both feet. If this condition worsens or becomes very painful, the patient was told to contact this office or go to the Emergency Department at the hospital.

## 2015-10-17 ENCOUNTER — Encounter: Payer: Self-pay | Admitting: Internal Medicine

## 2015-10-17 ENCOUNTER — Ambulatory Visit: Payer: Medicaid Other | Attending: Internal Medicine | Admitting: Internal Medicine

## 2015-10-17 VITALS — BP 122/83 | HR 79 | Temp 97.9°F | Resp 18 | Ht 74.0 in | Wt 230.2 lb

## 2015-10-17 DIAGNOSIS — H538 Other visual disturbances: Secondary | ICD-10-CM | POA: Insufficient documentation

## 2015-10-17 DIAGNOSIS — E1040 Type 1 diabetes mellitus with diabetic neuropathy, unspecified: Secondary | ICD-10-CM | POA: Diagnosis not present

## 2015-10-17 DIAGNOSIS — Z794 Long term (current) use of insulin: Secondary | ICD-10-CM | POA: Insufficient documentation

## 2015-10-17 DIAGNOSIS — Z79899 Other long term (current) drug therapy: Secondary | ICD-10-CM | POA: Diagnosis not present

## 2015-10-17 DIAGNOSIS — I10 Essential (primary) hypertension: Secondary | ICD-10-CM | POA: Diagnosis not present

## 2015-10-17 DIAGNOSIS — E1021 Type 1 diabetes mellitus with diabetic nephropathy: Secondary | ICD-10-CM | POA: Insufficient documentation

## 2015-10-17 LAB — BASIC METABOLIC PANEL
BUN: 41 mg/dL — AB (ref 7–25)
CHLORIDE: 110 mmol/L (ref 98–110)
CO2: 22 mmol/L (ref 20–31)
Calcium: 8.9 mg/dL (ref 8.6–10.3)
Creat: 3.44 mg/dL — ABNORMAL HIGH (ref 0.60–1.35)
GLUCOSE: 118 mg/dL — AB (ref 65–99)
POTASSIUM: 5 mmol/L (ref 3.5–5.3)
Sodium: 140 mmol/L (ref 135–146)

## 2015-10-17 LAB — CBC WITH DIFFERENTIAL/PLATELET
BASOS ABS: 0 10*3/uL (ref 0.0–0.1)
BASOS PCT: 0 % (ref 0–1)
EOS ABS: 0.1 10*3/uL (ref 0.0–0.7)
Eosinophils Relative: 2 % (ref 0–5)
HCT: 32.7 % — ABNORMAL LOW (ref 39.0–52.0)
HEMOGLOBIN: 10.8 g/dL — AB (ref 13.0–17.0)
LYMPHS ABS: 1.2 10*3/uL (ref 0.7–4.0)
Lymphocytes Relative: 22 % (ref 12–46)
MCH: 28.6 pg (ref 26.0–34.0)
MCHC: 33 g/dL (ref 30.0–36.0)
MCV: 86.7 fL (ref 78.0–100.0)
MONOS PCT: 7 % (ref 3–12)
MPV: 10.4 fL (ref 8.6–12.4)
Monocytes Absolute: 0.4 10*3/uL (ref 0.1–1.0)
NEUTROS ABS: 3.8 10*3/uL (ref 1.7–7.7)
NEUTROS PCT: 69 % (ref 43–77)
PLATELETS: 231 10*3/uL (ref 150–400)
RBC: 3.77 MIL/uL — ABNORMAL LOW (ref 4.22–5.81)
RDW: 14.5 % (ref 11.5–15.5)
WBC: 5.5 10*3/uL (ref 4.0–10.5)

## 2015-10-17 LAB — GLUCOSE, POCT (MANUAL RESULT ENTRY): POC GLUCOSE: 130 mg/dL — AB (ref 70–99)

## 2015-10-17 LAB — POCT GLYCOSYLATED HEMOGLOBIN (HGB A1C): Hemoglobin A1C: 9.9

## 2015-10-17 MED ORDER — INSULIN GLARGINE 100 UNIT/ML SOLOSTAR PEN: 15.0000 [IU] | PEN_INJECTOR | Freq: Every day | SUBCUTANEOUS | Status: DC

## 2015-10-17 NOTE — Progress Notes (Signed)
Patient ID: Dwayne HickmanBenjamin [name], male   DOB: 05/01/1980, 35 y.o.   MRN: [id number]   Dwayne [name], is a 35 y.o. male  [id number]  [id number]  DOB - 05/25/1980  Chief Complaint  Patient presents with  . Diabetes        Subjective:   Dwayne HickmanBenjamin Balin is a 35 y.o. male here today for a follow up visit. 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 Patient is here today for follow-up. He was recently seen by the podiatrist, had debridement of necrotic tissues on the right foot ulcer. Patient has follow-up. Patient was also seen by the nephrologist. His current regimen includes Lantus insulin 10 units daily at bedtime, although patient was supposed to take 20 units every at bedtime. He is also on NovoLog insulin 6 units 3 times a day. Blood sugar ranges from 185-400, fasting blood sugar ranges between 155-325. Patient has no new complaints today. Patient claims compliant with medications. He has symptoms of erectile dysfunction, currently working with wife to achieve pregnancy, they have not officially started seeing a gynecologist and no semen analysis had been performed. Patient denies any fever. Patient denies being depressed today although he said he feels lonely when his wife is at work during the day but when the wife comes home he feels better. He denies any suicidal ideation or thoughts. He declined being placed on any medication for depression or anxiety. Patient has No headache, No chest pain, No abdominal pain - No Nausea, No new weakness tingling or numbness, No Cough - SOB. He does not smoke cigarettes. He does not drink alcohol.  No problems updated.  ALLERGIES: No Known Allergies  PAST MEDICAL HISTORY: Past Medical History  Diagnosis Date  . Hypertension   . Acute renal failure  (HCC)   . Bacteremia     MSSA   . Diabetes type 2, uncontrolled (HCC) 01/09/2015  . 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 Essential hypertension, benign 01/18/2014  . Infective myositis of left thigh   . Noncompliance 01/18/2014  . Septic shock (HCC)   . Staphylococcus aureus bacteremia with sepsis (HCC)   . Tobacco abuse 01/15/2014  . Mitral regurgitation 01/13/2015    Severe by TEE  . MSSA (methicillin susceptible Staphylococcus aureus)   . Type II diabetes mellitus (HCC)     not controlled  . Diabetic foot infection (HCC) 01/15/2014  . 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 Authorizing Provider  acetaminophen-codeine (TYLENOL #3) 300-30 MG per tablet Take 1 tablet by mouth every 4 (four) hours as needed. 09/15/15  Yes Quentin Angstlugbemiga E Adriahna Shearman, MD  amLODipine (NORVASC) 10 MG tablet Take 1 tablet (10 mg total) by mouth daily. 09/15/15  Yes Quentin Angstlugbemiga E Ilyse Tremain, MD  carvedilol (COREG) 3.125 MG tablet Take 1 tablet (3.125 mg total) by mouth 2 (two) times daily. 09/15/15  Yes Quentin Angstlugbemiga E Jamell Laymon, MD  ciprofloxacin (CIPRO) 500 MG tablet Take 1 tablet (500 mg total) by mouth 2 (two) times daily. 08/18/15  Yes Quentin Angstlugbemiga E Romie Keeble, MD  clindamycin (CLEOCIN) 300 MG capsule Take 1 capsule (300 mg total) by mouth 3 (three)  times daily. 08/18/15  Yes Quentin Angst, MD  cloNIDine (CATAPRES) 0.3 MG tablet Take 1 tablet (0.3 mg total) by mouth 3 (three) times daily. 09/15/15  Yes Quentin Angst, MD  furosemide (LASIX) 40 MG tablet Take 1 tablet (40 mg total) by mouth 2 (two) times daily. 09/15/15  Yes Quentin Angst, MD  hydrALAZINE (APRESOLINE) 50 MG tablet Take 1 tablet (50 mg total) by mouth 3 (three)  times daily. 09/15/15  Yes Adams Hinch Annitta Needs, MD  insulin aspart (NOVOLOG FLEXPEN) 100 UNIT/ML FlexPen Inject 6 Units into the skin 3 (three) times daily with meals. 09/13/15  Yes Quentin Angst, MD  Insulin Glargine (LANTUS SOLOSTAR) 100 UNIT/ML Solostar Pen Inject 20 Units into the skin daily at 10 pm. 09/13/15  Yes Tanor Glaspy E Hyman Hopes, MD  Insulin Pen Needle (B-D UF III MINI PEN NEEDLES) 31G X 5 MM MISC Use one needle with each injection of Lantus Pen daily. 08/18/15  Yes Quentin Angst, MD  Insulin Syringe-Needle U-100 28G X 1/2" 0.5 ML MISC Use as directed. Any generic available. 01/28/15  Yes [name], DO  Lancets (FREESTYLE) lancets Any generic available. Use as instructed 01/28/15  Yes [name], DO  potassium chloride SA (K-DUR,KLOR-CON) 20 MEQ tablet Take 2 tablets daily 09/15/15  Yes Marvie Calender E Hyman Hopes, MD  cyclobenzaprine (FLEXERIL) 10 MG tablet Take 1 tablet (10 mg total) by mouth 2 (two) times daily as needed for muscle spasms. Patient not taking: Reported on 10/17/2015 09/15/15   Quentin Angst, MD     Objective:   Filed Vitals:   10/17/15 1003  BP: 122/83  Pulse: 79  Temp: 97.9 F (36.6 C)  TempSrc: Oral  Resp: 18  Height:  (1.88 m)  Weight: 230 lb 3.2 oz (104.418 kg)  SpO2: 99%    Exam General appearance : Awake, alert, not in any distress. Speech Clear. Not toxic looking HEENT: Atraumatic and Normocephalic, pupils equally reactive to light and accomodation Neck: supple, no JVD. No cervical lymphadenopathy.  Chest:Good air entry bilaterally, no added sounds  CVS: S1 S2 regular, no murmurs.  Abdomen: Bowel sounds present, Non tender and not distended with no gaurding, rigidity or rebound. Extremities: B/L Lower Ext shows no edema, both legs are warm to touch Neurology: Awake alert, and oriented X 3, CN II-XII intact, Non focal Skin: skin color, texture, and turgor are within normal limit, no interdigital maceration noted.Digital nails  are asymptomatic. No drainage noted from nails. Ulcer. S/P Debridement, healing ulcer under third metatarsal head right foot. ulcer sub 1st MPJ right foot. No evidence of redness or swelling or infection noted.  Data Review Lab Results  Component Value Date   HGBA1C 12.80 08/18/2015   HGBA1C 7.0 03/08/2015   HGBA1C 6.8* 02/18/2015     Assessment & Plan   1. Type 1 diabetes mellitus with diabetic neuropathy (HCC)  Increase Lantus Insulin to 15 units SubCut QHS from 10 units reported by patient Continue Novolog regular insulin @ 6 units TID  - Glucose (CBG) - Microalbumin/Creatinine Ratio, Urine - POCT A1C has come down to 9.9%  Aim for 30 minutes of exercise most days. Rethink what you drink. Water is great! Aim for 2-3 Carb Choices per meal (30-45 grams) +/- 1 either way  Aim for 0-15 Carbs per snack if hungry  Include protein in moderation with your meals and snacks  Consider reading food labels for Total Carbohydrate and Fat Grams of foods  Consider checking BG at alternate  times per day  Continue taking medication as directed Be mindful about how much sugar you are adding to beverages and other foods. Fruit Punch - find one with no sugar  Measure and decrease portions of carbohydrate foods  Make your plate and don't go back for seconds   2. Essential hypertension We have discussed target BP range and blood pressure goal. I have advised patient to check BP regularly and to call us back or report to clinic if the numbers are consistently higher than 140/90. We discussed the importance of compliance with medical therapy and DASH diet recommended, consequences of uncontrolled hypertension discussed.   - continue current BP medications  3. Blurry vision Ambulatory referral to Ophthalmologist  4. Diabetic nephropathy associated with type 1 diabetes mellitus (HCC)  - Basic Metabolic Panel - CBC with Differential  Patient have been counseled extensively about nutrition  and exercise  Return in about 4 weeks (around 11/14/2015) for Follow up HTN, CPP Stacey for BP/CBG and DM management. with CBG log  The patient was given clear instructions to go to ER or return to medical center if symptoms don't improve, worsen or new problems develop. The patient verbalized understanding. The patient was told to call to get lab results if they haven't heard anything in the next week.   This note has been created with Education officer, environmental. Any transcriptional errors are unintentional.    Jeanann Lewandowsky, MD, MHA, Maxwell Caul, CPE [facility] and Wellness [address], [place] [phone]   10/17/2015, 10:57 AM

## 2015-10-17 NOTE — Progress Notes (Signed)
Patient complains of pain in left eye for about a week, scaled at a 2 currently, described as aching pain.

## 2015-10-17 NOTE — Patient Instructions (Signed)
Diabetes and Exercise Exercising regularly is important. It is not just about losing weight. It has many health benefits, such as:  Improving your overall fitness, flexibility, and endurance.  Increasing your bone density.  Helping with weight control.  Decreasing your body fat.  Increasing your muscle strength.  Reducing stress and tension.  Improving your overall health. People with diabetes who exercise gain additional benefits because exercise:  Reduces appetite.  Improves the body's use of blood sugar (glucose).  Helps lower or control blood glucose.  Decreases blood pressure.  Helps control blood lipids (such as cholesterol and triglycerides).  Improves the body's use of the hormone insulin by:  Increasing the body's insulin sensitivity.  Reducing the body's insulin needs.  Decreases the risk for heart disease because exercising:  Lowers cholesterol and triglycerides levels.  Increases the levels of good cholesterol (such as high-density lipoproteins [HDL]) in the body.  Lowers blood glucose levels. YOUR ACTIVITY PLAN  Choose an activity that you enjoy, and set realistic goals. To exercise safely, you should begin practicing any new physical activity slowly, and gradually increase the intensity of the exercise over time. Your health care provider or diabetes educator can help create an activity plan that works for you. General recommendations include:  Encouraging children to engage in at least 60 minutes of physical activity each day.  Stretching and performing strength training exercises, such as yoga or weight lifting, at least 2 times per week.  Performing a total of at least 150 minutes of moderate-intensity exercise each week, such as brisk walking or water aerobics.  Exercising at least 3 days per week, making sure you allow no more than 2 consecutive days to pass without exercising.  Avoiding long periods of inactivity (90 minutes or more). When you  have to spend an extended period of time sitting down, take frequent breaks to walk or stretch. RECOMMENDATIONS FOR EXERCISING WITH TYPE 1 OR TYPE 2 DIABETES   Check your blood glucose before exercising. If blood glucose levels are greater than 240 mg/dL, check for urine ketones. Do not exercise if ketones are present.  Avoid injecting insulin into areas of the body that are going to be exercised. For example, avoid injecting insulin into:  The arms when playing tennis.  The legs when jogging.  Keep a record of:  Food intake before and after you exercise.  Expected peak times of insulin action.  Blood glucose levels before and after you exercise.  The type and amount of exercise you have done.  Review your records with your health care provider. Your health care provider will help you to develop guidelines for adjusting food intake and insulin amounts before and after exercising.  If you take insulin or oral hypoglycemic agents, watch for signs and symptoms of hypoglycemia. They include:  Dizziness.  Shaking.  Sweating.  Chills.  Confusion.  Drink plenty of water while you exercise to prevent dehydration or heat stroke. Body water is lost during exercise and must be replaced.  Talk to your health care provider before starting an exercise program to make sure it is safe for you. Remember, almost any type of activity is better than none.   This information is not intended to replace advice given to you by your health care provider. Make sure you discuss any questions you have with your health care provider.   Document Released: 03/08/2004 Document Revised: 05/03/2015 Document Reviewed: 05/26/2013 Elsevier Interactive Patient Education 2016 Elsevier Inc. Basic Carbohydrate Counting for Diabetes Mellitus Carbohydrate counting   is a method for keeping track of the amount of carbohydrates you eat. Eating carbohydrates naturally increases the level of sugar (glucose) in your  blood, so it is important for you to know the amount that is okay for you to have in every meal. Carbohydrate counting helps keep the level of glucose in your blood within normal limits. The amount of carbohydrates allowed is different for every person. A dietitian can help you calculate the amount that is right for you. Once you know the amount of carbohydrates you can have, you can count the carbohydrates in the foods you want to eat. Carbohydrates are found in the following foods:  Grains, such as breads and cereals.  Dried beans and soy products.  Starchy vegetables, such as potatoes, peas, and corn.  Fruit and fruit juices.  Milk and yogurt.  Sweets and snack foods, such as cake, cookies, candy, chips, soft drinks, and fruit drinks. CARBOHYDRATE COUNTING There are two ways to count the carbohydrates in your food. You can use either of the methods or a combination of both. Reading the "Nutrition Facts" on Packaged Food The "Nutrition Facts" is an area that is included on the labels of almost all packaged food and beverages in the [place]. It includes the serving size of that food or beverage and information about the nutrients in each serving of the food, including the grams (g) of carbohydrate per serving.  Decide the number of servings of this food or beverage that you will be able to eat or drink. Multiply that number of servings by the number of grams of carbohydrate that is listed on the label for that serving. The total will be the amount of carbohydrates you will be having when you eat or drink this food or beverage. Learning Standard Serving Sizes of Food When you eat food that is not packaged or does not include "Nutrition Facts" on the label, you need to measure the servings in order to count the amount of carbohydrates.A serving of most carbohydrate-rich foods contains about 15 g of carbohydrates. The following list includes serving sizes of carbohydrate-rich foods that  provide 15 g ofcarbohydrate per serving:   1 slice of bread (1 oz) or 1 six-inch tortilla.    of a hamburger bun or English muffin.  4-6 crackers.   cup unsweetened dry cereal.    cup hot cereal.   cup rice or pasta.    cup mashed potatoes or  of a large baked potato.  1 cup fresh fruit or one small piece of fruit.    cup canned or frozen fruit or fruit juice.  1 cup milk.   cup plain fat-free yogurt or yogurt sweetened with artificial sweeteners.   cup cooked dried beans or starchy vegetable, such as peas, corn, or potatoes.  Decide the number of standard-size servings that you will eat. Multiply that number of servings by 15 (the grams of carbohydrates in that serving). For example, if you eat 2 cups of strawberries, you will have eaten 2 servings and 30 g of carbohydrates (2 servings x 15 g = 30 g). For foods such as soups and casseroles, in which more than one food is mixed in, you will need to count the carbohydrates in each food that is included. EXAMPLE OF CARBOHYDRATE COUNTING Sample Dinner  3 oz chicken breast.   cup of brown rice.   cup of corn.  1 cup milk.   1 cup strawberries with sugar-free whipped topping.  Carbohydrate Calculation Step   1: Identify the foods that contain carbohydrates:   Rice.   Corn.   Milk.   Strawberries. Step 2:Calculate the number of servings eaten of each:   2 servings of rice.   1 serving of corn.   1 serving of milk.   1 serving of strawberries. Step 3: Multiply each of those number of servings by 15 g:   2 servings of rice x 15 g = 30 g.   1 serving of corn x 15 g = 15 g.   1 serving of milk x 15 g = 15 g.   1 serving of strawberries x 15 g = 15 g. Step 4: Add together all of the amounts to find the total grams of carbohydrates eaten: 30 g + 15 g + 15 g + 15 g = 75 g.   This information is not intended to replace advice given to you by your health care provider. Make sure you  discuss any questions you have with your health care provider.   Document Released: 12/17/2005 Document Revised: 01/07/2015 Document Reviewed: 11/13/2013 Elsevier Interactive Patient Education 2016 Elsevier Inc.  

## 2015-10-18 LAB — MICROALBUMIN / CREATININE URINE RATIO
CREATININE, URINE: 174 mg/dL (ref 20–370)
MICROALB UR: 293.2 mg/dL
MICROALB/CREAT RATIO: 1685 ug/mg{creat} — AB (ref <30)

## 2015-10-26 ENCOUNTER — Telehealth: Payer: Self-pay | Admitting: *Deleted

## 2015-10-26 NOTE — Telephone Encounter (Signed)
Medical Assistant left message on patient's home and cell voicemail. Voicemail states to give a call back to Nubia with [facility] at [phone].  

## 2015-10-26 NOTE — Telephone Encounter (Signed)
-----   Message from Quentin Angstlugbemiga E Jegede, MD sent at 10/26/2015 11:00 AM EDT ----- Please inform patient that his laboratory results shows worsening kidney function. Advise patient to follow up with nephrologist as scheduled.

## 2015-11-02 ENCOUNTER — Ambulatory Visit: Payer: Medicaid Other | Admitting: Podiatry

## 2015-11-03 ENCOUNTER — Ambulatory Visit: Payer: Medicaid Other | Admitting: Podiatry

## 2015-11-15 ENCOUNTER — Encounter: Payer: Self-pay | Admitting: Pharmacist

## 2015-11-21 ENCOUNTER — Telehealth: Payer: Self-pay | Admitting: Internal Medicine

## 2015-11-21 NOTE — Telephone Encounter (Signed)
Patient called to check on the status of the disability paperwork that was faxed over to our facility a month ago. Please f/u with pt.

## 2015-12-01 ENCOUNTER — Telehealth: Payer: Self-pay | Admitting: Internal Medicine

## 2015-12-01 NOTE — Telephone Encounter (Signed)
Pt. Dropped of paperwork to be filled out by PCP......pt. Asked if paperwork could be filled out as soon as possible....please call wife when paperwork is ready for pick up

## 2015-12-01 NOTE — Telephone Encounter (Signed)
Patients wife called and requested an appointment for the patient but there are no appointments available until January. Wife then requested to speak with PCP personally in regards to an appointment.   Please f/u

## 2015-12-02 ENCOUNTER — Emergency Department (HOSPITAL_COMMUNITY)
Admission: EM | Admit: 2015-12-02 | Discharge: 2015-12-03 | Disposition: A | Discharge status: Home / Self Care | Payer: Medicaid Other | Attending: Emergency Medicine | Admitting: Emergency Medicine

## 2015-12-02 ENCOUNTER — Encounter (HOSPITAL_COMMUNITY): Payer: Self-pay | Admitting: Emergency Medicine

## 2015-12-02 DIAGNOSIS — E11621 Type 2 diabetes mellitus with foot ulcer: Secondary | ICD-10-CM

## 2015-12-02 DIAGNOSIS — Z79899 Other long term (current) drug therapy: Secondary | ICD-10-CM | POA: Diagnosis not present

## 2015-12-02 DIAGNOSIS — Z794 Long term (current) use of insulin: Secondary | ICD-10-CM | POA: Diagnosis not present

## 2015-12-02 DIAGNOSIS — Z9889 Other specified postprocedural states: Secondary | ICD-10-CM | POA: Diagnosis not present

## 2015-12-02 DIAGNOSIS — Z87448 Personal history of other diseases of urinary system: Secondary | ICD-10-CM | POA: Diagnosis not present

## 2015-12-02 DIAGNOSIS — I10 Essential (primary) hypertension: Secondary | ICD-10-CM | POA: Insufficient documentation

## 2015-12-02 DIAGNOSIS — E1121 Type 2 diabetes mellitus with diabetic nephropathy: Secondary | ICD-10-CM | POA: Diagnosis not present

## 2015-12-02 DIAGNOSIS — Z8619 Personal history of other infectious and parasitic diseases: Secondary | ICD-10-CM | POA: Insufficient documentation

## 2015-12-02 DIAGNOSIS — Z8739 Personal history of other diseases of the musculoskeletal system and connective tissue: Secondary | ICD-10-CM | POA: Insufficient documentation

## 2015-12-02 DIAGNOSIS — L97519 Non-pressure chronic ulcer of other part of right foot with unspecified severity: Secondary | ICD-10-CM | POA: Diagnosis not present

## 2015-12-02 DIAGNOSIS — M79674 Pain in right toe(s): Secondary | ICD-10-CM | POA: Diagnosis present

## 2015-12-02 DIAGNOSIS — Z792 Long term (current) use of antibiotics: Secondary | ICD-10-CM | POA: Insufficient documentation

## 2015-12-02 DIAGNOSIS — F17210 Nicotine dependence, cigarettes, uncomplicated: Secondary | ICD-10-CM | POA: Insufficient documentation

## 2015-12-02 LAB — BASIC METABOLIC PANEL
ANION GAP: 7 (ref 5–15)
BUN: 45 mg/dL — ABNORMAL HIGH (ref 6–20)
CHLORIDE: 109 mmol/L (ref 101–111)
CO2: 20 mmol/L — AB (ref 22–32)
Calcium: 8.8 mg/dL — ABNORMAL LOW (ref 8.9–10.3)
Creatinine, Ser: 3.4 mg/dL — ABNORMAL HIGH (ref 0.61–1.24)
GFR calc non Af Amer: 22 mL/min — ABNORMAL LOW (ref >60)
GFR, EST AFRICAN AMERICAN: 25 mL/min — AB (ref >60)
Glucose, Bld: 293 mg/dL — ABNORMAL HIGH (ref 65–99)
POTASSIUM: 5.2 mmol/L — AB (ref 3.5–5.1)
Sodium: 136 mmol/L (ref 135–145)

## 2015-12-02 LAB — CBC WITH DIFFERENTIAL/PLATELET
Basophils Absolute: 0 10*3/uL (ref 0.0–0.1)
Basophils Relative: 0 %
Eosinophils Absolute: 0.1 10*3/uL (ref 0.0–0.7)
Eosinophils Relative: 3 %
HEMATOCRIT: 29.9 % — AB (ref 39.0–52.0)
HEMOGLOBIN: 10.1 g/dL — AB (ref 13.0–17.0)
LYMPHS ABS: 1.3 10*3/uL (ref 0.7–4.0)
LYMPHS PCT: 28 %
MCH: 28.9 pg (ref 26.0–34.0)
MCHC: 33.8 g/dL (ref 30.0–36.0)
MCV: 85.4 fL (ref 78.0–100.0)
MONOS PCT: 5 %
Monocytes Absolute: 0.2 10*3/uL (ref 0.1–1.0)
NEUTROS ABS: 2.9 10*3/uL (ref 1.7–7.7)
NEUTROS PCT: 64 %
Platelets: 210 10*3/uL (ref 150–400)
RBC: 3.5 MIL/uL — AB (ref 4.22–5.81)
RDW: 12.9 % (ref 11.5–15.5)
WBC: 4.6 10*3/uL (ref 4.0–10.5)

## 2015-12-02 LAB — I-STAT TROPONIN, ED: TROPONIN I, POC: 0.01 ng/mL (ref 0.00–0.08)

## 2015-12-02 MED ORDER — INSULIN ASPART 100 UNIT/ML IV SOLN
10.0000 [IU] | Freq: Once | INTRAVENOUS | Status: AC
  Administered 2015-12-02: 10 [IU] via INTRAVENOUS
  Filled 2015-12-02: qty 1

## 2015-12-02 MED ORDER — DEXTROSE 10 % IV SOLN
Freq: Once | INTRAVENOUS | Status: DC
Start: 2015-12-02 — End: 2015-12-02

## 2015-12-02 MED ORDER — DEXTROSE 50 % IV SOLN
1.0000 | Freq: Once | INTRAVENOUS | Status: AC
  Administered 2015-12-02: 50 mL via INTRAVENOUS
  Filled 2015-12-02: qty 50

## 2015-12-02 MED ORDER — SODIUM CHLORIDE 0.9 % IV SOLN
1.0000 g | Freq: Once | INTRAVENOUS | Status: AC
  Administered 2015-12-02: 1 g via INTRAVENOUS
  Filled 2015-12-02: qty 10

## 2015-12-02 NOTE — ED Notes (Signed)
Pt. reports worsening right great toe infection with swelling and malodorous drainage onset today , denies fever or chills.

## 2015-12-02 NOTE — ED Provider Notes (Signed)
CSN: [id number]     Arrival date & time 12/02/15  2040 History   First MD Initiated Contact with Patient 12/02/15 2119     Chief Complaint  Patient presents with  . Foot Problem     (Consider location/radiation/quality/duration/timing/severity/associated sxs/prior Treatment) The history is provided by the patient and medical records. No language interpreter was used.     Aadan Chenier is a 35 y.o. male  with a PMH of DM with neuropathy/nephropathy; HTN who presents to the Emergency Department complaining of persistent right big toe pain, onset this morning. Pt. Has diabetic ulcer on this region, recently debrided by podiatry in October - states he was changing his socks and noticed large piece of skin came off - underlying skin was beefy and red which frightened him. Denies fever, chills, swelling. Admits to clear fluid drainage initially when skin was removed.    Past Medical History  Diagnosis Date  . Hypertension   . Acute renal failure (HCC)   . Bacteremia     MSSA   . Diabetes type 2, uncontrolled (HCC) 01/09/2015  . 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 Essential hypertension, benign 01/18/2014  . Infective myositis of left thigh   . Noncompliance 01/18/2014  . Septic shock (HCC)   . Staphylococcus aureus bacteremia with sepsis (HCC)   . Tobacco abuse 01/15/2014  . Mitral regurgitation 01/13/2015    Severe by TEE  . MSSA (methicillin susceptible Staphylococcus aureus)   . Type II diabetes mellitus (HCC)     not controlled  . Diabetic foot infection (HCC) 01/15/2014  . 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 I&d extremity Bilateral 01/16/2014    Procedure:  DEBRIDEMENT of bilateral foot ulcers;  Surgeon: Kathryne Hitch, MD;  Location: [facility] OR;  Service: Orthopedics;  Laterality: Bilateral;  . Tee without cardioversion N/A 01/13/2015    Procedure: TRANSESOPHAGEAL ECHOCARDIOGRAM (TEE);  Surgeon: Thurmon Fair, MD;  Location: [facility] ENDOSCOPY;  Service: Cardiovascular;  Laterality: N/A;  . Left and right heart catheterization with coronary angiogram N/A 01/24/2015    Procedure: LEFT AND RIGHT HEART CATHETERIZATION WITH CORONARY ANGIOGRAM;  Surgeon: Peter M Swaziland, MD;  Location: [facility] CATH LAB;  Service: Cardiovascular;  Laterality: N/A;  . Multiple extractions with alveoloplasty N/A 01/25/2015    Procedure: Extraction of tooth #'s 1,2,3,4,5,6,8,9,11,12,13,14,15,16,17,18,19 28,31,32 with alveoloplasty and gross debridement of remaining teeth.;  Surgeon: Charlynne Pander, DDS;  Location[facility] OR;  Service: Oral Surgery;  Laterality: N/A;  . Cardiac catheterization  12/2014  . Shoulder arthroscopy Right 02/05/2015    Procedure: ARTHROSCOPY SHOULDER;  Surgeon: Nadara Mustard, MD;  Location: [facility] OR;  Service: Orthopedics;  Laterality: Right;  . Mitral valve replacement Right 02/18/2015    Procedure: MINIMALLY INVASIVE MITRAL VALVE (MV) REPAIR;  Surgeon: Purcell Nails, MD;  Location: [facility] OR;  Service: Open Heart Surgery;  Laterality: Right;  . Tee without cardioversion N/A 02/18/2015    Procedure: TRANSESOPHAGEAL ECHOCARDIOGRAM (TEE);  Surgeon: Purcell Nails, MD;  Location: [facility] OR;  Service: Open Heart Surgery;  Laterality: N/A;   Family History  Problem Relation Age of Onset  . Heart attack Neg Hx   . Stroke Maternal Uncle   . Hypertension Mother   . Diabetes Brother    Social History  Substance Use Topics  . Smoking status: Former Smoker -- 1.00 packs/day for 12 years    Types: Cigars  . Smokeless tobacco: Never Used     Comment: black and mild cigars  . Alcohol Use: No    Review of Systems  Constitutional: Negative.   HENT: Negative for congestion, rhinorrhea and sore throat.   Eyes: Negative for visual  disturbance.  Respiratory: Negative for cough, shortness of breath and wheezing.   Cardiovascular: Negative.   Gastrointestinal: Negative for nausea, vomiting, abdominal pain, diarrhea and constipation.  Endocrine: Negative for polydipsia and polyuria.  Musculoskeletal: Negative for back pain and neck pain.  Skin: Negative for rash.  Neurological: Negative for dizziness, weakness and headaches.      Allergies  Review of patient's allergies indicates no known allergies.  Home Medications   Prior to Admission medications   Medication Sig Start Date End Date Taking? Authorizing Provider  amLODipine (NORVASC) 10 MG tablet Take 1 tablet (10 mg total) by mouth daily. 09/15/15  Yes Quentin Angst, MD  carvedilol (COREG) 3.125 MG tablet Take 1 tablet (3.125 mg total) by mouth 2 (two) times daily. 09/15/15  Yes Quentin Angst, MD  cloNIDine (CATAPRES) 0.3 MG tablet Take 1 tablet (0.3 mg total) by mouth 3 (three) times daily. 09/15/15  Yes Quentin Angst, MD  furosemide (LASIX) 40 MG tablet Take 1 tablet (40 mg total) by mouth 2 (two) times daily. Patient taking differently: Take 40-80 mg by mouth 2 (two) times daily. Takes  in am and  in pm 09/15/15  Yes Olugbemiga E Hyman Hopes, MD  hydrALAZINE (APRESOLINE) 50 MG tablet Take 1 tablet (50 mg total) by mouth 3 (three) times daily. 09/15/15  Yes Olugbemiga Annitta Needs, MD  insulin aspart (NOVOLOG FLEXPEN) 100 UNIT/ML FlexPen Inject 6 Units into the skin 3 (three) times daily with meals. 09/13/15  Yes Quentin Angst, MD  Insulin Glargine (LANTUS SOLOSTAR) 100 UNIT/ML Solostar Pen Inject 15 Units into the skin daily at 10 pm. 10/17/15  Yes Quentin Angst, MD  potassium chloride SA (K-DUR,KLOR-CON) 20 MEQ tablet Take 2 tablets daily 09/15/15  Yes Olugbemiga E Hyman Hopes, MD  acetaminophen-codeine (TYLENOL #3) 300-30 MG per tablet Take 1 tablet by mouth every 4 (four) hours as needed. 09/15/15   Quentin Angst, MD  ciprofloxacin  (CIPRO) 500 MG tablet Take 1 tablet (500 mg total) by mouth 2 (two) times daily. 08/18/15   Quentin Angst, MD  clindamycin (CLEOCIN) 300 MG capsule Take 1 capsule (300 mg total) by mouth 3 (three) times daily. 08/18/15   Quentin Angst, MD  cyclobenzaprine (FLEXERIL) 10 MG tablet Take 1 tablet (10 mg total) by mouth 2 (two) times daily as needed for muscle spasms. Patient not taking: Reported on 10/17/2015 09/15/15   Quentin Angst, MD  Insulin Pen Needle (B-D UF III MINI PEN NEEDLES) 31G X 5 MM MISC Use one needle with each injection of Lantus Pen daily. 08/18/15   Quentin Angst, MD  Insulin Syringe-Needle U-100 28G X 1/2" 0.5 ML MISC Use as directed. Any generic available. 01/28/15   [name], DO  Lancets (FREESTYLE) lancets Any generic available. Use as instructed 01/28/15   [name], DO   BP 146/86 mmHg  Pulse 67  Temp(Src) 97.5 F (36.4 C) (Oral)  Resp 14  SpO2 100% Physical Exam  Constitutional: He is oriented to person, place, and time. He appears well-developed and well-nourished.  Alert and in no acute distress  HENT:  Head: Normocephalic and atraumatic.  Cardiovascular: Normal rate, regular rhythm, normal heart sounds and intact distal pulses.  Exam reveals no gallop and no friction rub.   No murmur heard. Pulmonary/Chest: Effort normal and breath sounds normal. No respiratory distress. He has no wheezes. He has no rales. He exhibits no tenderness.  Abdominal: He exhibits no mass. There is no rebound and no guarding.  Abdomen soft, non-tender, non-distended Bowel sounds positive in all four quadrants  Musculoskeletal: He exhibits edema.  Neurological: He is alert and oriented to person, place, and time.  Sensation to bilateral LE intact  Skin: Skin is warm and dry.  2x2 open ulcer of right main toe plantar surface with no drainage, surrounding erythema; no streaking. Left plantar surface with healing DM ulcer calloused.   Psychiatric: He has a  normal mood and affect. His behavior is normal. Judgment and thought content normal.  Nursing note and vitals reviewed.   ED Course  Procedures (including critical care time) Labs Review Labs Reviewed  BASIC METABOLIC PANEL - Abnormal; Notable for the following:    Potassium 5.2 (*)    CO2 20 (*)    Glucose, Bld 293 (*)    BUN 45 (*)    Creatinine, Ser 3.40 (*)    Calcium 8.8 (*)    GFR calc non Af Amer 22 (*)    GFR calc Af Amer 25 (*)    All other components within normal limits  CBC WITH DIFFERENTIAL/PLATELET - Abnormal; Notable for the following:    RBC 3.50 (*)    Hemoglobin 10.1 (*)    HCT 29.9 (*)    All other components within normal limits  POTASSIUM  I-STAT TROPOININ, ED  CBG MONITORING, ED    Imaging Review No results found. I have personally reviewed and evaluated these images and lab results as part of my medical decision-making.   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 Will dress wound with ABX ointment and recommend PCP or podiatry follow-up.   Labs reviewed - K+ of 5.2 - On lasix and potassium at home. EKG ordered which shows peaked T waves. BUN/Cr elevated as well. Will order calcium, insulin, glucose and monitor.   Repeat K+ back to 4.6 -- Patient is  on lasix and potassium at home; increase most likely 2/2 medication.   Will dc to home in good and stable condition. Stressed the importance of nephrology follow-up as soon as possible and no NSAIDS. Wound dressed.  Questions answered, patient expresses understanding and agrees with plan.   Patient seen by and discussed with Dr. Dalene SeltzerSchlossman who agrees with treatment plan.     [facility] Hospital - Palm BeachJaime Pilcher Gareth Fitzner, PA-C 12/03/15 [id number]  Alvira MondayErin Schlossman, MD 12/04/15 2242

## 2015-12-03 LAB — CBG MONITORING, ED: GLUCOSE-CAPILLARY: 78 mg/dL (ref 65–99)

## 2015-12-03 LAB — POTASSIUM: POTASSIUM: 4.6 mmol/L (ref 3.5–5.1)

## 2015-12-03 MED ORDER — BACITRACIN ZINC 500 UNIT/GM EX OINT
TOPICAL_OINTMENT | Freq: Once | CUTANEOUS | Status: AC
  Administered 2015-12-03: 1 via TOPICAL

## 2015-12-03 NOTE — Discharge Instructions (Signed)
It is very important to follow up with your nephrologist (kidney doctor) in the next 1-3 days. Please call and set up an appointment. Follow up with your primary doctor for discussion of your diagnoses and further evaluation after today's visit  Please return to the ER for any new or worsening symptoms, any additional concerns.  Continue usual home medications - do not take any NSAID medications include motrin and Advil: they can harm your kidneys

## 2015-12-05 ENCOUNTER — Ambulatory Visit: Payer: Medicaid Other | Attending: Internal Medicine | Admitting: Internal Medicine

## 2015-12-05 ENCOUNTER — Encounter: Payer: Self-pay | Admitting: Internal Medicine

## 2015-12-05 VITALS — BP 160/98 | HR 80 | Temp 98.4°F | Resp 18 | Ht 74.0 in | Wt 232.8 lb

## 2015-12-05 DIAGNOSIS — E1042 Type 1 diabetes mellitus with diabetic polyneuropathy: Secondary | ICD-10-CM | POA: Insufficient documentation

## 2015-12-05 DIAGNOSIS — E1021 Type 1 diabetes mellitus with diabetic nephropathy: Secondary | ICD-10-CM | POA: Diagnosis not present

## 2015-12-05 DIAGNOSIS — L84 Corns and callosities: Secondary | ICD-10-CM | POA: Insufficient documentation

## 2015-12-05 DIAGNOSIS — E10621 Type 1 diabetes mellitus with foot ulcer: Secondary | ICD-10-CM | POA: Insufficient documentation

## 2015-12-05 DIAGNOSIS — Z79899 Other long term (current) drug therapy: Secondary | ICD-10-CM | POA: Insufficient documentation

## 2015-12-05 DIAGNOSIS — G894 Chronic pain syndrome: Secondary | ICD-10-CM | POA: Insufficient documentation

## 2015-12-05 DIAGNOSIS — Z794 Long term (current) use of insulin: Secondary | ICD-10-CM | POA: Diagnosis not present

## 2015-12-05 DIAGNOSIS — H538 Other visual disturbances: Secondary | ICD-10-CM

## 2015-12-05 DIAGNOSIS — I10 Essential (primary) hypertension: Secondary | ICD-10-CM | POA: Insufficient documentation

## 2015-12-05 DIAGNOSIS — H532 Diplopia: Secondary | ICD-10-CM | POA: Insufficient documentation

## 2015-12-05 LAB — GLUCOSE, POCT (MANUAL RESULT ENTRY): POC Glucose: 251 mg/dl — AB (ref 70–99)

## 2015-12-05 MED ORDER — INSULIN GLARGINE 100 UNIT/ML SOLOSTAR PEN: 15.0000 [IU] | PEN_INJECTOR | Freq: Every day | SUBCUTANEOUS | Status: DC

## 2015-12-05 MED ORDER — FREESTYLE LANCETS MISC: Status: DC

## 2015-12-05 MED ORDER — CARVEDILOL 3.125 MG PO TABS: 3.1250 mg | ORAL_TABLET | Freq: Two times a day (BID) | ORAL | Status: DC

## 2015-12-05 MED ORDER — "INSULIN SYRINGE-NEEDLE U-100 28G X 1/2"" 0.5 ML MISC": Status: AC

## 2015-12-05 MED ORDER — INSULIN ASPART 100 UNIT/ML FLEXPEN: 6.0000 [IU] | PEN_INJECTOR | Freq: Three times a day (TID) | SUBCUTANEOUS | Status: DC

## 2015-12-05 MED ORDER — INSULIN PEN NEEDLE 31G X 5 MM MISC: Status: DC

## 2015-12-05 MED ORDER — DOXYCYCLINE HYCLATE 100 MG PO TABS: 100.0000 mg | ORAL_TABLET | Freq: Two times a day (BID) | ORAL | Status: DC

## 2015-12-05 MED ORDER — AMLODIPINE BESYLATE 10 MG PO TABS: 10.0000 mg | ORAL_TABLET | Freq: Every day | ORAL | Status: DC

## 2015-12-05 MED ORDER — HYDRALAZINE HCL 50 MG PO TABS: 50.0000 mg | ORAL_TABLET | Freq: Three times a day (TID) | ORAL | Status: DC

## 2015-12-05 MED ORDER — CLONIDINE HCL 0.3 MG PO TABS: 0.3000 mg | ORAL_TABLET | Freq: Three times a day (TID) | ORAL | Status: DC

## 2015-12-05 MED ORDER — FUROSEMIDE 40 MG PO TABS: 40.0000 mg | ORAL_TABLET | Freq: Two times a day (BID) | ORAL | Status: DC

## 2015-12-05 MED ORDER — ACETAMINOPHEN-CODEINE #3 300-30 MG PO TABS: 1.0000 | ORAL_TABLET | ORAL | Status: DC | PRN

## 2015-12-05 MED ORDER — GLUCOSE BLOOD VI STRP
ORAL_STRIP | Status: DC
Start: 2015-12-05 — End: 2015-12-10

## 2015-12-05 NOTE — Telephone Encounter (Signed)
Medical Assistant left message on patient's home and cell voicemail. Voicemail states to give a call back to [place] [facility] at [phone].   Please ask patient or his wife, which condition/ailment is the patient using to file his disability?

## 2015-12-05 NOTE — Progress Notes (Signed)
Patient ID: Dwayne [name], male   DOB: 04-Dec-1980, 35 y.o.   MRN: [id number]   Dwayne [name], is a 35 y.o. male  OXB:[id number]  AST:[id number]  DOB - 02/09/1980  No chief complaint on file.       Subjective:   Dwayne [name] is a 35 y.o. male here today for a follow up visit. 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 Patient was recently seen in the ED for worsening diabetic foot ulcer. He had been seen by the podiatrist, he has had debridement of necrotic tissues on the right and is following with podiatrist for ongoing treatment of pre-ulcer calluses on both feet. Patient has blood sugar is better controlled now that the fianc is helping in the management. He is having worsening blurry vision and will have double vision when he tries to see with both eyes, he sees better when he closes his left eye. His vision also get worse when he uses his glasses. He has seen the ophthalmologist, had surgery, he has a follow-up appointment next week 12/13/2015. His follow-up with podiatrist just this week 12/08/2015. Patient has not seen a nephrologist lately and his kidney function is deteriorating, his fiance complained that he is not making urine as he used to despite being on Lasix. Patient denies being depressed at this time because he has enough support from his fiancee. He denies fever. No chest pain. He feels full easily but no vomiting, although he has nausea occasionally. Patient does not smoke cigarettes. He has not taken his medications today, the reason for his high blood pressure and blood sugar.  Patient also needs paperwork filled out for disability, he is in need of a letter stating why he is not able to work to support his claim for back child support.     Problem  Diabetic Nephropathy Associated With Type 1 Diabetes Mellitus (Hcc)  Chronic Pain Syndrome    ALLERGIES: No Known Allergies  PAST MEDICAL HISTORY: Past Medical History  Diagnosis Date  . Hypertension   . Acute renal failure ([place])   . Bacteremia     MSSA   . Diabetes type 2, uncontrolled ([place]) 01/09/2015  . 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 Essential hypertension, benign 01/18/2014  . Infective myositis of left thigh   . Noncompliance 01/18/2014  . Septic shock ([place])   . Staphylococcus aureus bacteremia with sepsis ([place])   . Tobacco abuse 01/15/2014  . Mitral regurgitation 01/13/2015    Severe by TEE  . MSSA (methicillin susceptible Staphylococcus aureus)   . Type II diabetes mellitus (HCC)     not controlled  . Diabetic foot infection ([place]) 01/15/2014  . 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 Authorizing Provider  acetaminophen-codeine (TYLENOL #3) 300-30 MG tablet Take 1 tablet by mouth every 4 (four) hours as needed. 12/05/15  Yes [name], MD  amLODipine (NORVASC) 10 MG tablet Take 1 tablet (10 mg total) by mouth daily. 12/05/15  Yes [name], MD  carvedilol (COREG) 3.125 MG tablet Take 1 tablet (3.125 mg total) by mouth 2 (two) times daily.  12/05/15  Yes [name], MD  cloNIDine (CATAPRES) 0.3 MG tablet Take 1 tablet (0.3 mg total) by mouth 3 (three) times daily. 12/05/15  Yes [name], MD  cyclobenzaprine (FLEXERIL) 10 MG tablet Take 1 tablet (10 mg total) by mouth 2 (two) times daily as needed for muscle spasms. 09/15/15  Yes [name], MD  furosemide (LASIX) 40 MG tablet Take 1-2 tablets (40-80 mg total) by mouth 2 (two)  times daily. Takes 30m in am and 831min pm 12/05/15  Yes Vian Fluegel E JeDoreene BurkeMD  hydrALAZINE (APRESOLINE) 50 MG tablet Take 1 tablet (50 mg total) by mouth 3 (three) times daily. 12/05/15  Yes Allysen Lazo E Essie ChristineMD  insulin aspart (NOVOLOG FLEXPEN) 100 UNIT/ML FlexPen Inject 6 Units into the skin 3 (three) times daily with meals. 12/05/15  Yes OlTresa GarterMD  Insulin Glargine (LANTUS SOLOSTAR) 100 UNIT/ML Solostar Pen Inject 15 Units into the skin daily at 10 pm. 12/05/15  Yes Artina Minella E JeDoreene BurkeMD  Insulin Pen Needle (B-D UF III MINI PEN NEEDLES) 31G X 5 MM MISC Use one needle with each injection of Lantus Pen daily. 12/05/15  Yes OlTresa GarterMD  Insulin Syringe-Needle U-100 28G X 1/2" 0.5 ML MISC Use as directed. Any generic available. 12/05/15  Yes OlTresa GarterMD  Lancets (FREESTYLE) lancets Any generic available. Use as instructed 12/05/15  Yes OlTresa GarterMD  potassium chloride SA (K-DUR,KLOR-CON) 20 MEQ tablet Take 2 tablets daily 09/15/15  Yes OlTresa GarterMD  ciprofloxacin (CIPRO) 500 MG tablet Take 1 tablet (500 mg total) by mouth 2 (two) times daily. Patient not taking: Reported on 12/05/2015 08/18/15   OlTresa GarterMD  clindamycin (CLEOCIN) 300 MG capsule Take 1 capsule (300 mg total) by mouth 3 (three) times daily. Patient not taking: Reported on 12/05/2015 08/18/15   OlTresa GarterMD  doxycycline (VIBRA-TABS) 100 MG tablet Take 1 tablet (100 mg total) by mouth 2 (two) times daily. 12/05/15   OlTresa GarterMD  glucose blood (FREESTYLE TEST STRIPS) test strip Use as instructed 12/05/15   OlTresa GarterMD     Objective:   Filed Vitals:   12/05/15 0950  BP: 160/98  Pulse: 80  Temp: 98.4 F (36.9 C)  TempSrc: Oral  Resp: 18  Height: 6' 2"  (1.88 m)  Weight: 232 lb 12.8 oz (105.597 kg)  SpO2: 98%    Exam General appearance : Awake, alert, not in any distress. Speech Clear.  Chronically ill-looking HEENT:  Atraumatic and Normocephalic, poor vision especially left eye, ? drooping Neck: supple, no JVD. No cervical lymphadenopathy.  Chest:Good air entry bilaterally, no added sounds  CVS: S1 S2 regular, no murmurs.  Abdomen: Bowel sounds present, Non tender and not distended with no gaurding, rigidity or rebound. Extremities: B/L Lower Ext shows no edema, both legs are warm to touch Neurology: Awake alert, and oriented X 3, CN II-XII intact, Non focal Bilateral foot calluses and ulcers, no active drainage, no foul smell  Data Review Lab Results  Component Value Date   HGBA1C 9.90 10/17/2015   HGBA1C 12.80 08/18/2015   HGBA1C 7.0 03/08/2015     Assessment & Plan   1. Type 1 diabetes mellitus with diabetic polyneuropathy (HCC)  Refill: - Insulin Glargine (LANTUS SOLOSTAR) 100 UNIT/ML Solostar Pen; Inject 15 Units into the skin daily at 10 pm.  Dispense: 3 pen; Refill: 3 - insulin aspart (NOVOLOG FLEXPEN) 100 UNIT/ML FlexPen; Inject  6 Units into the skin 3 (three) times daily with meals.  Dispense: 15 mL; Refill: 3 - Insulin Pen Needle (B-D UF III MINI PEN NEEDLES) 31G X 5 MM MISC; Use one needle with each injection of Lantus Pen daily.  Dispense: 100 each; Refill: 3 - Insulin Syringe-Needle U-100 28G X 1/2" 0.5 ML MISC; Use as directed. Any generic available.  Dispense: 100 each; Refill: 12 - Lancets (FREESTYLE) lancets; Any generic available. Use as instructed  Dispense: 100 each; Refill: 12  Aim for 30 minutes of exercise most days. Rethink what you drink. Water is great! Aim for 2-3 Carb Choices per meal (30-45 grams) +/- 1 either way  Aim for 0-15 Carbs per snack if hungry  Include protein in moderation with your meals and snacks  Consider reading food labels for Total Carbohydrate and Fat Grams of foods  Consider checking BG at alternate times per day  Continue taking medication as directed Be mindful about how much sugar you are adding to beverages and other foods. Fruit Punch -  find one with no sugar  Measure and decrease portions of carbohydrate foods  Make your plate and don't go back for seconds  2. Blurry vision  Follow up with ophthalmologist on 12/13/2015 as scheduled   3. Diabetic nephropathy associated with type 1 diabetes mellitus (HCC) eGFR < 30 - Ambulatory referral to Nephrology  4. Essential hypertension  - amLODipine (NORVASC) 10 MG tablet; Take 1 tablet (10 mg total) by mouth daily.  Dispense: 90 tablet; Refill: 3 - carvedilol (COREG) 3.125 MG tablet; Take 1 tablet (3.125 mg total) by mouth 2 (two) times daily.  Dispense: 180 tablet; Refill: 3 - cloNIDine (CATAPRES) 0.3 MG tablet; Take 1 tablet (0.3 mg total) by mouth 3 (three) times daily.  Dispense: 90 tablet; Refill: 3 - hydrALAZINE (APRESOLINE) 50 MG tablet; Take 1 tablet (50 mg total) by mouth 3 (three) times daily.  Dispense: 270 tablet; Refill: 3  We have discussed target BP range and blood pressure goal. I have advised patient to check BP regularly and to call us back or report to clinic if the numbers are consistently higher than 140/90. We discussed the importance of compliance with medical therapy and DASH diet recommended, consequences of uncontrolled hypertension discussed.    5. Pre-ulcerative calluses  - doxycycline (VIBRA-TABS) 100 MG tablet; Take 1 tablet (100 mg total) by mouth 2 (two) times daily.  Dispense: 20 tablet; Refill: 0 - Follow up with Podiatrist on Thursday 12/08/2015 as scheduled - Wound care emphasized - Discussed with patients patients (and his fiance on the phone) the importance of compliance with medications and wound care  6. Chronic pain syndrome  - acetaminophen-codeine (TYLENOL #3) 300-30 MG tablet; Take 1 tablet by mouth every 4 (four) hours as needed.  Dispense: 60 tablet; Refill: 0  - Papers filled out for disability, and a letter stating why patient faces barriers to gainful employment was written in support of his disability application and child  support ...  Patient have been counseled extensively about nutrition and exercise  Return in about 4 weeks (around 01/02/2016) for Hemoglobin A1C and Follow up, DM, Follow up HTN, Follow up Pain and comorbidities.  The patient was given clear instructions to go to ER or return to medical center if symptoms don't improve, worsen or new problems develop. The patient verbalized understanding. The patient was told to call to get lab results if they haven't heard anything in the next week.   This note has  been created with Surveyor, quantity. Any transcriptional errors are unintentional.    Angelica Chessman, MD, [place], [place], [place], [place], [place]   12/05/2015, 11:15 AM

## 2015-12-05 NOTE — Progress Notes (Signed)
Patient here for chest pain, eye sight, and feet concerns.  Patient complains of right foot pain scaled at a 4 when pressure is not being applied. Pain is been present for some time. Patients skin in right big toe is now missing with a foul odor being present.  Patient needs Freestyle test strips ordered.  Patients Disability Paperwork and Child Support letter was completed, signed and dated. The patient received the copies at the end of his visit and took them with him. Both copies were placed in envelopes and sealed. A copy of patients disability paperwork was made and filed in the front desk scan pile.

## 2015-12-05 NOTE — Patient Instructions (Signed)
Basic Carbohydrate Counting for Diabetes Mellitus Carbohydrate counting is a method for keeping track of the amount of carbohydrates you eat. Eating carbohydrates naturally increases the level of sugar (glucose) in your blood, so it is important for you to know the amount that is okay for you to have in every meal. Carbohydrate counting helps keep the level of glucose in your blood within normal limits. The amount of carbohydrates allowed is different for every person. A dietitian can help you calculate the amount that is right for you. Once you know the amount of carbohydrates you can have, you can count the carbohydrates in the foods you want to eat. Carbohydrates are found in the following foods:  Grains, such as breads and cereals.  Dried beans and soy products.  Starchy vegetables, such as potatoes, peas, and corn.  Fruit and fruit juices.  Milk and yogurt.  Sweets and snack foods, such as cake, cookies, candy, chips, soft drinks, and fruit drinks. CARBOHYDRATE COUNTING There are two ways to count the carbohydrates in your food. You can use either of the methods or a combination of both. Reading the "Nutrition Facts" on Packaged Food The "Nutrition Facts" is an area that is included on the labels of almost all packaged food and beverages in the [place]. It includes the serving size of that food or beverage and information about the nutrients in each serving of the food, including the grams (g) of carbohydrate per serving.  Decide the number of servings of this food or beverage that you will be able to eat or drink. Multiply that number of servings by the number of grams of carbohydrate that is listed on the label for that serving. The total will be the amount of carbohydrates you will be having when you eat or drink this food or beverage. Learning Standard Serving Sizes of Food When you eat food that is not packaged or does not include "Nutrition Facts" on the label, you need to  measure the servings in order to count the amount of carbohydrates.A serving of most carbohydrate-rich foods contains about 15 g of carbohydrates. The following list includes serving sizes of carbohydrate-rich foods that provide 15 g ofcarbohydrate per serving:   1 slice of bread (1 oz) or 1 six-inch tortilla.    of a hamburger bun or English muffin.  4-6 crackers.   cup unsweetened dry cereal.    cup hot cereal.   cup rice or pasta.    cup mashed potatoes or  of a large baked potato.  1 cup fresh fruit or one small piece of fruit.    cup canned or frozen fruit or fruit juice.  1 cup milk.   cup plain fat-free yogurt or yogurt sweetened with artificial sweeteners.   cup cooked dried beans or starchy vegetable, such as peas, corn, or potatoes.  Decide the number of standard-size servings that you will eat. Multiply that number of servings by 15 (the grams of carbohydrates in that serving). For example, if you eat 2 cups of strawberries, you will have eaten 2 servings and 30 g of carbohydrates (2 servings x 15 g = 30 g). For foods such as soups and casseroles, in which more than one food is mixed in, you will need to count the carbohydrates in each food that is included. EXAMPLE OF CARBOHYDRATE COUNTING Sample Dinner  3 oz chicken breast.   cup of brown rice.   cup of corn.  1 cup milk.   1 cup strawberries with   sugar-free whipped topping.  Carbohydrate Calculation Step 1: Identify the foods that contain carbohydrates:   Rice.   Corn.   Milk.   Strawberries. Step 2:Calculate the number of servings eaten of each:   2 servings of rice.   1 serving of corn.   1 serving of milk.   1 serving of strawberries. Step 3: Multiply each of those number of servings by 15 g:   2 servings of rice x 15 g = 30 g.   1 serving of corn x 15 g = 15 g.   1 serving of milk x 15 g = 15 g.   1 serving of strawberries x 15 g = 15 g. Step 4: Add  together all of the amounts to find the total grams of carbohydrates eaten: 30 g + 15 g + 15 g + 15 g = 75 g.   This information is not intended to replace advice given to you by your health care provider. Make sure you discuss any questions you have with your health care provider.   Document Released: 12/17/2005 Document Revised: 01/07/2015 Document Reviewed: 11/13/2013 Elsevier Interactive Patient Education 2016 Elsevier Inc[employer] Hypertension Hypertension, commonly called high blood pressure, is when the force of blood pumping through your arteries is too strong. Your arteries are the blood vessels that carry blood from your heart throughout your body. A blood pressure reading consists of a higher number over a lower number, such as 110/72. The higher number (systolic) is the pressure inside your arteries when your heart pumps. The lower number (diastolic) is the pressure inside your arteries when your heart relaxes. Ideally you want your blood pressure below 120/80. Hypertension forces your heart to work harder to pump blood. Your arteries may become narrow or stiff. Having untreated or uncontrolled hypertension can cause heart attack, stroke, kidney disease, and other problems. RISK FACTORS Some risk factors for high blood pressure are controllable. Others are not.  Risk factors you cannot control include:   Race. You may be at higher risk if you are African American.  Age. Risk increases with age.  Gender. Men are at higher risk than women before age 45 years. After age 65, women are at higher risk than men. Risk factors you can control include:  Not getting enough exercise or physical activity.  Being overweight.  Getting too much fat, sugar, calories, or salt in your diet.  Drinking too much alcohol. SIGNS AND SYMPTOMS Hypertension does not usually cause signs or symptoms. Extremely high blood pressure (hypertensive crisis) may cause headache, anxiety, shortness of breath, and  nosebleed. DIAGNOSIS To check if you have hypertension, your health care provider will measure your blood pressure while you are seated, with your arm held at the level of your heart. It should be measured at least twice using the same arm. Certain conditions can cause a difference in blood pressure between your right and left arms. A blood pressure reading that is higher than normal on one occasion does not mean that you need treatment. If it is not clear whether you have high blood pressure, you may be asked to return on a different day to have your blood pressure checked again. Or, you may be asked to monitor your blood pressure at home for 1 or more weeks. TREATMENT Treating high blood pressure includes making lifestyle changes and possibly taking medicine. Living a healthy lifestyle can help lower high blood pressure. You may need to change some of your habits. Lifestyle changes may include:    Following the DASH diet. This diet is high in fruits, vegetables, and whole grains. It is low in salt, red meat, and added sugars.  Keep your sodium intake below 2,300 mg per day.  Getting at least 30-45 minutes of aerobic exercise at least 4 times per week.  Losing weight if necessary.  Not smoking.  Limiting alcoholic beverages.  Learning ways to reduce stress. Your health care provider may prescribe medicine if lifestyle changes are not enough to get your blood pressure under control, and if one of the following is true:  You are 5118-35 years of age and your systolic blood pressure is above 140.  You are 460 years of age or older, and your systolic blood pressure is above 150.  Your diastolic blood pressure is above 90.  You have diabetes, and your systolic blood pressure is over 140 or your diastolic blood pressure is over 90.  You have kidney disease and your blood pressure is above 140/90.  You have heart disease and your blood pressure is above 140/90. Your personal target blood  pressure may vary depending on your medical conditions, your age, and other factors. HOME CARE INSTRUCTIONS  Have your blood pressure rechecked as directed by your health care provider.   Take medicines only as directed by your health care provider. Follow the directions carefully. Blood pressure medicines must be taken as prescribed. The medicine does not work as well when you skip doses. Skipping doses also puts you at risk for problems.  Do not smoke.   Monitor your blood pressure at home as directed by your health care provider. SEEK MEDICAL CARE IF:   You think you are having a reaction to medicines taken.  You have recurrent headaches or feel dizzy.  You have swelling in your ankles.  You have trouble with your vision. SEEK IMMEDIATE MEDICAL CARE IF:  You develop a severe headache or confusion.  You have unusual weakness, numbness, or feel faint.  You have severe chest or abdominal pain.  You vomit repeatedly.  You have trouble breathing. MAKE SURE YOU:   Understand these instructions.  Will watch your condition.  Will get help right away if you are not doing well or get worse.   This information is not intended to replace advice given to you by your health care provider. Make sure you discuss any questions you have with your health care provider.   Document Released: 12/17/2005 Document Revised: 05/03/2015 Document Reviewed: 10/09/2013 Elsevier Interactive Patient Education 2016 Elsevier Inc. Diabetes and Foot Care Diabetes may cause you to have problems because of poor blood supply (circulation) to your feet and legs. This may cause the skin on your feet to become thinner, break easier, and heal more slowly. Your skin may become dry, and the skin may peel and crack. You may also have nerve damage in your legs and feet causing decreased feeling in them. You may not notice minor injuries to your feet that could lead to infections or more serious problems. Taking  care of your feet is one of the most important things you can do for yourself.  HOME CARE INSTRUCTIONS  Wear shoes at all times, even in the house. Do not go barefoot. Bare feet are easily injured.  Check your feet daily for blisters, cuts, and redness. If you cannot see the bottom of your feet, use a mirror or ask someone for help.  Wash your feet with warm water (do not use hot water) and mild soap. Then pat  your feet and the areas between your toes until they are completely dry. Do not soak your feet as this can dry your skin.  Apply a moisturizing lotion or petroleum jelly (that does not contain alcohol and is unscented) to the skin on your feet and to dry, brittle toenails. Do not apply lotion between your toes.  Trim your toenails straight across. Do not dig under them or around the cuticle. File the edges of your nails with an emery board or nail file.  Do not cut corns or calluses or try to remove them with medicine.  Wear clean socks or stockings every day. Make sure they are not too tight. Do not wear knee-high stockings since they may decrease blood flow to your legs.  Wear shoes that fit properly and have enough cushioning. To break in new shoes, wear them for just a few hours a day. This prevents you from injuring your feet. Always look in your shoes before you put them on to be sure there are no objects inside.  Do not cross your legs. This may decrease the blood flow to your feet.  If you find a minor scrape, cut, or break in the skin on your feet, keep it and the skin around it clean and dry. These areas may be cleansed with mild soap and water. Do not cleanse the area with peroxide, alcohol, or iodine.  When you remove an adhesive bandage, be sure not to damage the skin around it.  If you have a wound, look at it several times a day to make sure it is healing.  Do not use heating pads or hot water bottles. They may burn your skin. If you have lost feeling in your feet or  legs, you may not know it is happening until it is too late.  Make sure your health care provider performs a complete foot exam at least annually or more often if you have foot problems. Report any cuts, sores, or bruises to your health care provider immediately. SEEK MEDICAL CARE IF:   You have an injury that is not healing.  You have cuts or breaks in the skin.  You have an ingrown nail.  You notice redness on your legs or feet.  You feel burning or tingling in your legs or feet.  You have pain or cramps in your legs and feet.  Your legs or feet are numb.  Your feet always feel cold. SEEK IMMEDIATE MEDICAL CARE IF:   There is increasing redness, swelling, or pain in or around a wound.  There is a red line that goes up your leg.  Pus is coming from a wound.  You develop a fever or as directed by your health care provider.  You notice a bad smell coming from an ulcer or wound.   This information is not intended to replace advice given to you by your health care provider. Make sure you discuss any questions you have with your health care provider.   Document Released: 12/14/2000 Document Revised: 08/19/2013 Document Reviewed: 05/26/2013 Elsevier Interactive Patient Education [employer]

## 2015-12-07 NOTE — Telephone Encounter (Signed)
Per CMA note on 12/05/15, disability condition/ailment ahs been identified and paperwork has been taken care of.

## 2015-12-08 ENCOUNTER — Ambulatory Visit (INDEPENDENT_AMBULATORY_CARE_PROVIDER_SITE_OTHER): Payer: Medicaid Other | Admitting: Podiatry

## 2015-12-08 ENCOUNTER — Encounter: Payer: Self-pay | Admitting: Podiatry

## 2015-12-08 DIAGNOSIS — L97501 Non-pressure chronic ulcer of other part of unspecified foot limited to breakdown of skin: Secondary | ICD-10-CM | POA: Diagnosis not present

## 2015-12-08 DIAGNOSIS — M79609 Pain in unspecified limb: Secondary | ICD-10-CM

## 2015-12-08 DIAGNOSIS — B351 Tinea unguium: Secondary | ICD-10-CM | POA: Diagnosis not present

## 2015-12-08 DIAGNOSIS — L97519 Non-pressure chronic ulcer of other part of right foot with unspecified severity: Principal | ICD-10-CM

## 2015-12-08 DIAGNOSIS — E10621 Type 1 diabetes mellitus with foot ulcer: Secondary | ICD-10-CM

## 2015-12-08 DIAGNOSIS — L97529 Non-pressure chronic ulcer of other part of left foot with unspecified severity: Principal | ICD-10-CM

## 2015-12-08 NOTE — Progress Notes (Signed)
Subjective:     Patient ID: Dwayne HickmanBenjamin [name], male   DOB: 05/24/1980, 35 y.o.   MRN: [id number]  HPI this patient returns to the office for follow-up for an evaluation of his ulcers on both feet. He has been under treatment for these ulcers for approximately 5 months by myself. He presents the office today stating his feet are better than they have been in a while. He has been caring for his feet and staying off his.  feet more now than he had initially he presents to the office saying there is still an odor. This patient asked if I can talk with and tell his friend who cares for his feet  About   my findings. He presents for continued evaluation and treatment.  He does admit to removing a bandage from his big toe right foot and pulled off all the skin on the bottom of his toe.  He was seen by MD last Friday who gave him antibiotics and told him to follow up in my office.  Review of Systems     Objective:   Physical Exam Physical Exam GENERAL APPEARANCE: . Appropriately groomed. No acute distress.  VASCULAR: [facility] bilateral. Capillary refill time is immediate to all digits, Normal temperature gradient. Digital hair growth is present bilateral  NEUROLOGIC: sensation is absent to 5.07 monofilament at 5/5 sites bilateral. Light touch is intact bilateral, Muscle strength normal.  MUSCULOSKELETAL: acceptable muscle strength, tone and stability bilateral. Intrinsic muscluature intact bilateral. Rectus appearance of foot and digits noted bilateral.   DERMATOLOGIC: skin color, texture, and turgor are within normal limits. , no interdigital maceration noted. .. No drainage noted from nails. Ulcer The right foot has a healing ulcer under third metatarsal head right foot. There is significant breakdown of ulcer sub 1st MPJ right foot. There is no evidence of redness or swelling or drainage noted under  1st MPJ  right foot. Significant necrotic tissue noted. .   This ulcer measures 40 mm. X 40 mm. There is hyperkeratotic roof over the ulcer under 2,3,4 left foot. No malodor noted. No drainage from ulcer left foot.  He has developed a new callus under fifth metatarsal head left foot. There is red subcutaneous skin noted under his right hallux in the absence of plantar skin.  No redness or swelling noted.  NAILS  Thick disfigured discolored nails both feet.  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   Preulcerous callus sub 23 and 5 left foot.  Preulcerous callus sub 3 right foot. Onychomycosis B/L  Plan:     Debridement of necrotic tissue from ulcers right foot and bandaging right foot with silvadene,   Debridement of preulcerous lesions left foot.  Debridement of onychomycosis.  RTC 2 weeks. Home instructions for soaking and bandaging.  Helane GuntherGregory Lakeeta Dobosz DPM

## 2015-12-09 NOTE — Telephone Encounter (Signed)
Spoke to patient and verified name and date of birth.  Patient still has no handicap sign.  Patient cannot pick up form.  RN agreed to mail him the application and explained that the MD would have to sign it when he was finished.  Patient verbalized understanding.

## 2015-12-10 ENCOUNTER — Other Ambulatory Visit: Payer: Self-pay | Admitting: Internal Medicine

## 2015-12-10 DIAGNOSIS — E1042 Type 1 diabetes mellitus with diabetic polyneuropathy: Secondary | ICD-10-CM

## 2015-12-10 MED ORDER — GLUCOSE BLOOD VI STRP: ORAL_STRIP | Status: DC

## 2015-12-10 MED ORDER — INSULIN PEN NEEDLE 31G X 5 MM MISC: Status: AC

## 2015-12-10 MED ORDER — ACCU-CHEK SOFT TOUCH LANCETS MISC: Status: DC

## 2015-12-12 ENCOUNTER — Telehealth: Payer: Self-pay | Admitting: Internal Medicine

## 2015-12-12 ENCOUNTER — Encounter: Payer: Self-pay | Admitting: Internal Medicine

## 2015-12-12 ENCOUNTER — Other Ambulatory Visit: Payer: Self-pay

## 2015-12-12 MED ORDER — GLUCOSE BLOOD VI STRP
ORAL_STRIP | Status: DC
Start: 2015-12-12 — End: 2015-12-13

## 2015-12-12 NOTE — Telephone Encounter (Signed)
MESSAGE FROM FAX: NEED FOR CLARIFICATION  ACCU-CHEK ACTIVE STP IS NO LONGER MADE. Please change. Thank you!

## 2015-12-13 ENCOUNTER — Other Ambulatory Visit: Payer: Self-pay | Admitting: *Deleted

## 2015-12-13 MED ORDER — GLUCOSE BLOOD VI STRP: ORAL_STRIP | Status: DC

## 2015-12-15 NOTE — Telephone Encounter (Signed)
Pt. Wife called requesting Acuu check Aviva test strips because that is the meter her husband is using. Please f/u with pt.

## 2015-12-19 ENCOUNTER — Other Ambulatory Visit: Payer: Self-pay | Admitting: *Deleted

## 2015-12-19 MED ORDER — GLUCOSE BLOOD VI STRP: ORAL_STRIP | Status: DC

## 2015-12-19 NOTE — Telephone Encounter (Signed)
Medical Assistant left message on patient's home and cell voicemail. Voicemail states to give a call back to [place] [facility] at [phone].  Please inform patient of accu-check test strips [facility]

## 2015-12-21 NOTE — Telephone Encounter (Signed)
error 

## 2015-12-22 ENCOUNTER — Telehealth: Payer: Self-pay | Admitting: Internal Medicine

## 2015-12-22 ENCOUNTER — Ambulatory Visit (INDEPENDENT_AMBULATORY_CARE_PROVIDER_SITE_OTHER): Payer: Medicaid Other | Admitting: Podiatry

## 2015-12-22 VITALS — BP 165/106 | HR 80 | Resp 14

## 2015-12-22 DIAGNOSIS — E10621 Type 1 diabetes mellitus with foot ulcer: Secondary | ICD-10-CM | POA: Diagnosis not present

## 2015-12-22 DIAGNOSIS — E1151 Type 2 diabetes mellitus with diabetic peripheral angiopathy without gangrene: Secondary | ICD-10-CM

## 2015-12-22 DIAGNOSIS — L97509 Non-pressure chronic ulcer of other part of unspecified foot with unspecified severity: Secondary | ICD-10-CM

## 2015-12-22 DIAGNOSIS — L97529 Non-pressure chronic ulcer of other part of left foot with unspecified severity: Secondary | ICD-10-CM

## 2015-12-22 DIAGNOSIS — L97519 Non-pressure chronic ulcer of other part of right foot with unspecified severity: Secondary | ICD-10-CM

## 2015-12-22 NOTE — Progress Notes (Signed)
Subjective:     Patient ID: Dwayne [name], male   DOB: 09/04/1980, 35 y.o.   MRN: [id number]  HPI this patient returns to the office for follow-up examination of the ulcers which she has on both feet. He has been under treatment for ulcers on the ball of her his right foot for 5 months now. These ulcers have presently healed and there is callus noted at these areas. He has a plantar ulcer under the right hallux with no redness, swelling or infection noted. He has a persistent area of dead tissue which she says caries an odor on his left forefoot. He presents the office today with his mother for further evaluation and treatment. He admits to having eye surgery.   Review of Systems     Objective:   Physical Exam Physical Exam GENERAL APPEARANCE: . Appropriately groomed. No acute distress.  VASC[facility]: [facility]. Capillary refill time is immediate to all digits, Normal temperature gradient. Digital hair growth is present bilateral  NEUROLOGIC: sensation is absent to 5.07 monofilament at 5/5 sites bilateral. Light touch is intact bilateral, Muscle strength normal.  MUSCULOSKELETAL: acceptable muscle strength, tone and stability bilateral. Intrinsic muscluature intact bilateral. Rectus appearance of foot and digits noted bilateral.   DERMATOLOGIC: skin color, texture, and turgor are within normal limits except at ulcer sites.. , no interdigital maceration noted. .. No drainage noted from nails. Ulcer The right foot has closed ulcers on the right forefoot and a callus is present, so 135 metatarsals, right foot. There is an ulcer under the right hallux with no signs of redness, swelling or infection. There is normal healthy-appearing granulation tissue noted at the site. Examination of his left foot does reveal the presence of a hyperkeratotic roof over the second, third and fourth metatarsals, left foot. No malodor noted. No drainage noted from the ulcer,  debridement of the hyperkeratotic roof reveals no evidence of any pus or drainage  NAILS Thick disfigured discolored nails both feet     Assessment:     Diabetic ulcer right hallux  Diabetic ulcer 2,3,4 left forefoot.       Plan:    ROV  evaluation of his right foot was very encouraging that the ulcers have been replaced by a hyperkeratotic no fluctuance or infection noted. He does have a continued ulcer under the right hallux which is clean  and we need to continue home soaks and treatment. Examination of his left foot does reveal hyperkeratotic roof, which was debrided  today and removed . Both open skin  sites were bandaged with Neosporin and dry sterile dressing and Coban . I am encouraged by the healing of the ulcers on his right foot but his left foot has not been improving.  I am concerned that this may breakdown and we may have to treat a more difficult foot pathology. Therefore, I recommended a Cam Walker be dispensed and told him he needs to ambulate with the Cam Walker until he returns to the office in 2 weeks. Patient is to continue home soaks and bandaging. If this condition worsens or becomes very painful, the patient was told to contact this office or go to the Emergency Department at the hospital.  Helane GuntherGregory Gracey [name] DPM

## 2015-12-22 NOTE — Telephone Encounter (Signed)
Pt. Dropped off form for handicap sticker

## 2015-12-23 NOTE — Telephone Encounter (Signed)
Patient verified DOB Patient informed of Handicap sticker being placed at the front desk for his pickup. Patient expressed his understanding and had no further questions.

## 2015-12-28 ENCOUNTER — Telehealth: Payer: Self-pay | Admitting: Internal Medicine

## 2015-12-28 NOTE — Telephone Encounter (Signed)
Pt's wife called wanting to know if he is eligible for obtaining a CNA to be with him. Pt. Is having constipation and his wife is asking if there is medication to be prescribed to help   Pt needs more medication for the infection in his feet

## 2015-12-29 ENCOUNTER — Telehealth: Payer: Self-pay | Admitting: Internal Medicine

## 2015-12-29 NOTE — Telephone Encounter (Signed)
Patient verified DOB Patient stated he used the bathroom once today. The patient has been constipated for 3 days. Medical assistant advised patient to use OTC dulcolax. Patient also advised of a referral needing to be placed by the provider for Home Health. Patient informed of provider being out of the office and being able to address the concern upon his return. Patient expressed his understanding. Provider will also have to assess patients foot prior to ordering more antibiotic.

## 2015-12-29 NOTE — Telephone Encounter (Signed)
Patient's wife called wanting to know status of patients condition Per [place], patient was read nurse instruction.

## 2016-01-05 ENCOUNTER — Ambulatory Visit: Payer: Medicaid Other | Admitting: Podiatry

## 2016-01-05 ENCOUNTER — Ambulatory Visit (INDEPENDENT_AMBULATORY_CARE_PROVIDER_SITE_OTHER): Payer: Medicaid Other | Admitting: Podiatry

## 2016-01-05 ENCOUNTER — Encounter: Payer: Self-pay | Admitting: Podiatry

## 2016-01-05 VITALS — BP 165/104 | HR 83 | Resp 16

## 2016-01-05 DIAGNOSIS — L97519 Non-pressure chronic ulcer of other part of right foot with unspecified severity: Principal | ICD-10-CM

## 2016-01-05 DIAGNOSIS — L97501 Non-pressure chronic ulcer of other part of unspecified foot limited to breakdown of skin: Secondary | ICD-10-CM | POA: Diagnosis not present

## 2016-01-05 DIAGNOSIS — L97529 Non-pressure chronic ulcer of other part of left foot with unspecified severity: Principal | ICD-10-CM

## 2016-01-05 DIAGNOSIS — E10621 Type 1 diabetes mellitus with foot ulcer: Secondary | ICD-10-CM

## 2016-01-05 MED ORDER — SILVER SULFADIAZINE 1 % EX CREA: 1.0000 "application " | TOPICAL_CREAM | Freq: Two times a day (BID) | CUTANEOUS | Status: AC

## 2016-01-05 NOTE — Progress Notes (Signed)
Subjective:     Patient ID: Dwayne HickmanBenjamin Packman, male   DOB: 08/07/1980, 36 y.o.   MRN: [id number]  HPI . He has been under treatment for ulcers on his right foot for 2 months now. The ulcer his right foot has been decreasing in size and decreasing in drainage according to the patient. The ulcer on his left fifth forefoot does reveal dryness and healing, but does have a malodor noted at the area near the arch.  He  presents the office today for an evaluation and treatment.  He has been soaking his feet and bandaging his feet twice a day and then rebandaging.   Review of Systems     Objective:   Physical Exam GENERAL APPEARANCE: Alert, conversant..  Appropriately groomed. No acute distress.  VASCULAR: Pedal [facility]  [facility].  Capillary refill time is immediate to all digits,  Normal temperature gradient.  Digital hair growth is present bilateral  NEUROLOGIC: sensation is normal to 5.07 monofilament at 5/5 sites bilateral.  Light touch is intact bilateral, Muscle strength normal.  MUSCULOSKELETAL: acceptable muscle strength, tone and stability bilateral.  Intrinsic muscluature intact bilateral.  Rectus appearance of foot and digits noted bilateral.   DERMATOLOGIC: skin color, texture, and turgor are within normal limits.  Digital nails are asymptomatic. No drainage noted. ULCER  The ulcer under his big toe joint has healthy hyperkeratotic roof with normal skin developing under the roof.  No redness or swelling.  The right hallux ulcer hsas healthy granulation tissue and has decrease in ulcer size.  The size is 20 mm. X 20 mm.No infection.  The ulcer sub 5th has healthy hyperkeratotic roof.  The ulcer under forefoot left is 30 mm. X 30 mm.  No redness or infection.  Necrotic tissue noted especia;lly at proximal aspect of ulcer. No infection or drainage.     Assessment:     Diabetic Ulcer     Plan:  Debride necrotic tissue.  Silvadene/DSD applied.  To continue with soaks.   Apply silvadene.  Continue with cam walker  RTC 2 weeks. Prescribe silvadene.   Helane GuntherGregory Alpha Chouinard DPM

## 2016-01-16 ENCOUNTER — Ambulatory Visit: Payer: Medicaid Other | Attending: Internal Medicine | Admitting: Internal Medicine

## 2016-01-16 ENCOUNTER — Encounter: Payer: Self-pay | Admitting: Internal Medicine

## 2016-01-16 VITALS — BP 139/93 | HR 78 | Temp 98.2°F | Resp 18 | Ht 74.0 in | Wt 236.2 lb

## 2016-01-16 DIAGNOSIS — K3184 Gastroparesis: Secondary | ICD-10-CM | POA: Insufficient documentation

## 2016-01-16 DIAGNOSIS — Z794 Long term (current) use of insulin: Secondary | ICD-10-CM | POA: Insufficient documentation

## 2016-01-16 DIAGNOSIS — E118 Type 2 diabetes mellitus with unspecified complications: Secondary | ICD-10-CM | POA: Diagnosis not present

## 2016-01-16 DIAGNOSIS — N179 Acute kidney failure, unspecified: Secondary | ICD-10-CM | POA: Diagnosis not present

## 2016-01-16 DIAGNOSIS — E1069 Type 1 diabetes mellitus with other specified complication: Secondary | ICD-10-CM | POA: Insufficient documentation

## 2016-01-16 DIAGNOSIS — E1143 Type 2 diabetes mellitus with diabetic autonomic (poly)neuropathy: Secondary | ICD-10-CM

## 2016-01-16 DIAGNOSIS — E1021 Type 1 diabetes mellitus with diabetic nephropathy: Secondary | ICD-10-CM

## 2016-01-16 DIAGNOSIS — E1165 Type 2 diabetes mellitus with hyperglycemia: Secondary | ICD-10-CM

## 2016-01-16 DIAGNOSIS — Z79899 Other long term (current) drug therapy: Secondary | ICD-10-CM | POA: Insufficient documentation

## 2016-01-16 DIAGNOSIS — I10 Essential (primary) hypertension: Secondary | ICD-10-CM | POA: Diagnosis not present

## 2016-01-16 LAB — GLUCOSE, POCT (MANUAL RESULT ENTRY): POC Glucose: 242 mg/dl — AB (ref 70–99)

## 2016-01-16 LAB — POCT GLYCOSYLATED HEMOGLOBIN (HGB A1C): Hemoglobin A1C: 9.5

## 2016-01-16 MED ORDER — METOCLOPRAMIDE HCL 10 MG PO TABS: 10.0000 mg | ORAL_TABLET | Freq: Three times a day (TID) | ORAL | Status: DC

## 2016-01-16 MED ORDER — CARVEDILOL 6.25 MG PO TABS: 6.2500 mg | ORAL_TABLET | Freq: Two times a day (BID) | ORAL | Status: DC

## 2016-01-16 MED ORDER — PROMETHAZINE HCL 25 MG/ML IJ SOLN
25.0000 mg | Freq: Once | INTRAMUSCULAR | Status: AC
  Administered 2016-01-16: 25 mg via INTRAMUSCULAR

## 2016-01-16 MED ORDER — ADULT BLOOD PRESSURE CUFF LG KIT: PACK | Status: AC

## 2016-01-16 NOTE — Progress Notes (Signed)
Patient here for FU DM  Patient complains of N/V over the past week. Patient has not taken any medication for symptoms.  Patient denies pain at this time.  Patient tolerated IM injection well.

## 2016-01-16 NOTE — Progress Notes (Addendum)
Patient ID: Dwayne [name], male   DOB: [birth year], 36 y.o.   MRN: [id number]   Dwayne [name], is a 36 y.o. male  WTU:[id number]  ZPH:[id number]  DOB - 1980/09/27  Chief Complaint  Patient presents with  . Follow-up        Subjective:   Dwayne [name] is a 36 y.o. 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 Currently follows up with nephrologist for diabetic nephropathy, ophthalmologist for diabetic retinopathy  (next appointment 01/31/2016 for removal of silicone and possible laser surgery). Because of blurry vision from diabetic retinopathy patient is requesting home nurse aide for medication assistance especially insulin injections. Patient's blood pressure has not been consistently at goal, according to the patient's fianc who called in to this appointment, blood pressure systolic has been in the 801K to 553Z and diastolic in the 48O to 707E. Patient continues to follow with podiatrist for his diabetic foot, according to the last podiatrist note which was reviewed today, the ulcer under his big toe has healthy hyperkeratotic with normal skin developing under the roof. The tissues were noted especially at the proximal aspect with no drainage, patient currently applies Silvadene to wounds. Major complaint today is intense nausea, bloating, belching of foul smell, and easy satiety. Patient has no vomiting at this time. No fever. No abdominal distention. No constipation or diarrhea. Because of intense nausea, patient is unable to eat as he wants to and for that reason blood sugar remains high because he does not use insulin except he wants to eat. Patient has No headache, No chest pain, No new weakness tingling or numbness, No Cough - SOB.  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 Hypertension   .  Acute renal failure ([place])   . Bacteremia     MSSA   . Diabetes type 2, uncontrolled ([place]) 01/09/2015  . 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 Essential hypertension, benign 01/18/2014  . Infective myositis of left thigh   . Noncompliance 01/18/2014  . Septic shock ([place])   . Staphylococcus aureus bacteremia with sepsis ([place])   . Tobacco abuse 01/15/2014  . Mitral regurgitation 01/13/2015    Severe by TEE  . MSSA (methicillin susceptible Staphylococcus aureus)   . Type II diabetes mellitus (HCC)     not controlled  . Diabetic foot infection ([place]) 01/15/2014  . 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 Authorizing Provider  acetaminophen-codeine (TYLENOL #3) 300-30 MG tablet Take 1 tablet by mouth every 4 (four) hours as needed. 12/05/15  Yes [name], MD  amLODipine (NORVASC) 10 MG tablet Take 1 tablet (10 mg total) by mouth daily. 12/05/15  Yes [name], MD  carvedilol (COREG) 6.25 MG tablet Take 1 tablet (6.25 mg total) by mouth 2 (two) times daily with a meal. 01/16/16  Yes [name], MD  ciprofloxacin (CIPRO) 500 MG tablet Take 1 tablet (500 mg total) by mouth 2 (two) times daily. 08/18/15  Yes [name], MD  clindamycin (CLEOCIN) 300 MG capsule Take 1 capsule (300 mg total) by  mouth 3 (three) times daily. 08/18/15  Yes [name], MD  cloNIDine (CATAPRES) 0.3 MG tablet Take 1 tablet (0.3 mg total) by mouth 3 (three) times daily. 12/05/15  Yes [name], MD  doxycycline (VIBRA-TABS) 100 MG tablet Take 1 tablet (100 mg total) by mouth 2 (two) times daily. 12/05/15  Yes [name], MD  furosemide (LASIX) 40 MG tablet Take 1-2 tablets (40-80  mg total) by mouth 2 (two) times daily. Takes 47m in am and 879min pm 12/05/15  Yes Larae Caison E JeDoreene BurkeMD  glucose blood (ACCU-CHEK AVIVA PLUS) test strip Use as instructed 12/19/15  Yes OlTresa GarterMD  hydrALAZINE (APRESOLINE) 50 MG tablet Take 1 tablet (50 mg total) by mouth 3 (three) times daily. 12/05/15  Yes Trenden Hazelrigg E Essie ChristineMD  insulin aspart (NOVOLOG FLEXPEN) 100 UNIT/ML FlexPen Inject 6 Units into the skin 3 (three) times daily with meals. 12/05/15  Yes OlTresa GarterMD  Insulin Glargine (LANTUS SOLOSTAR) 100 UNIT/ML Solostar Pen Inject 15 Units into the skin daily at 10 pm. 12/05/15  Yes Rachana Malesky E JeDoreene BurkeMD  Insulin Pen Needle (B-D UF III MINI PEN NEEDLES) 31G X 5 MM MISC Use one needle with each injection of Lantus Pen daily. 12/10/15  Yes OlTresa GarterMD  Insulin Syringe-Needle U-100 28G X 1/2" 0.5 ML MISC Use as directed. Any generic available. 12/05/15  Yes OlTresa GarterMD  Lancets (ACCU-CHEK SOFT TOUCH) lancets Use as instructed 12/10/15  Yes OlTresa GarterMD  silver sulfADIAZINE (SILVADENE) 1 % cream Apply 1 application topically 2 (two) times daily. 01/05/16  Yes GrGardiner BarefootDPM  Blood Pressure Monitoring (ADULT BLOOD PRESSURE CUFF LG) KIT Measure BP 2x daily 01/16/16   OlTresa GarterMD  cyclobenzaprine (FLEXERIL) 10 MG tablet Take 1 tablet (10 mg total) by mouth 2 (two) times daily as needed for muscle spasms. Patient not taking: Reported on 01/16/2016 09/15/15   OlTresa GarterMD  metoCLOPramide (REGLAN) 10 MG tablet Take 1 tablet (10 mg total) by mouth 4 (four) times daily -  before meals and at bedtime. 01/16/16   OlTresa GarterMD  potassium chloride SA (K-DUR,KLOR-CON) 20 MEQ tablet Take 2 tablets daily Patient not taking: Reported on 01/16/2016 09/15/15   OlTresa GarterMD  UNABLE TO FIND Reported on 01/16/2016 12/19/15   Historical Provider, MD     Objective:   Filed Vitals:   01/16/16 0956  BP: 139/93    Pulse: 78  Temp: 98.2 F (36.8 C)  TempSrc: Oral  Resp: 18  Height: _0  (1.88 m)  Weight: 236 lb 3.2 oz (107.14 kg)  SpO2: 99%    Exam General appearance : Awake, alert, not in any distress. Speech Clear. Chronically ill-looking but Not toxic looking HEENT: Atraumatic and Normocephalic, pupils equally reactive to light and accomodation Neck: supple, no JVD. No cervical lymphadenopathy.  Chest:Good air entry bilaterally, no added sounds  CVS: S1 S2 regular, no murmurs.  Abdomen: Bowel sounds present, Non tender and not distended with no gaurding, rigidity or rebound. Extremities: Diabetic foot ulcers mostly left foot, dressed, no evidence of infection Neurology: Awake alert, and oriented X 3, CN II-XII intact, Non focal Skin:No Rash  Data Review Lab Results  Component Value Date   HGBA1C 9.50 01/16/2016   HGBA1C 9.90 10/17/2015   HGBA1C 12.80 08/18/2015     Assessment & Plan   1. Uncontrolled type 1 diabetes mellitus with complication, long term  insulin use (HCC) - POCT A1C - Glucose (CBG) - Ambulatory referral to Home Health  2. Essential hypertension: BP not at goal for Diabetic Nephropathy  Increase from 3.125 mg bid to - carvedilol (COREG) 6.25 MG tablet; Take 1 tablet (6.25 mg total) by mouth 2 (two) times daily with a meal.  Dispense: 180 tablet; Refill: 3 - Blood Pressure Monitoring (ADULT BLOOD PRESSURE CUFF LG) KIT; Measure BP 2x daily  Dispense: 1 each; Refill: 0  We have discussed target BP range and blood pressure goal. I have advised patient to check BP regularly and to call us back or report to clinic if the numbers are consistently higher than 140/90. We discussed the importance of compliance with medical therapy and DASH diet recommended, consequences of uncontrolled hypertension discussed.   - continue other current BP medications  3. 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  Dispense: 60 tablet; Refill: 0  4. Diabetic nephropathy associated with type 1 diabetes mellitus ([place])  Follow up with Nephrologist  Patient have been counseled extensively about nutrition and exercise  Return in about 2 weeks (around 01/30/2016) for BP Check, Nurse Visit, CPP Stacey for BP/CBG and DM management.  The patient was given clear instructions to go to ER or return to medical center if symptoms don't improve, worsen or new problems develop. The patient verbalized understanding. The patient was told to call to get lab results if they haven't heard anything in the next week.   This note has been created with Surveyor, quantity. Any transcriptional errors are unintentional.    Angelica Chessman, MD, [place], Karilyn Cota, [place] and [place], [place]   01/16/2016, 10:42 AM

## 2016-01-16 NOTE — Patient Instructions (Signed)
Diabetes and Exercise Exercising regularly is important. It is not just about losing weight. It has many health benefits, such as:  Improving your overall fitness, flexibility, and endurance.  Increasing your bone density.  Helping with weight control.  Decreasing your body fat.  Increasing your muscle strength.  Reducing stress and tension.  Improving your overall health. People with diabetes who exercise gain additional benefits because exercise:  Reduces appetite.  Improves the body's use of blood sugar (glucose).  Helps lower or control blood glucose.  Decreases blood pressure.  Helps control blood lipids (such as cholesterol and triglycerides).  Improves the body's use of the hormone insulin by:  Increasing the body's insulin sensitivity.  Reducing the body's insulin needs.  Decreases the risk for heart disease because exercising:  Lowers cholesterol and triglycerides levels.  Increases the levels of good cholesterol (such as high-density lipoproteins [HDL]) in the body.  Lowers blood glucose levels. YOUR ACTIVITY PLAN  Choose an activity that you enjoy, and set realistic goals. To exercise safely, you should begin practicing any new physical activity slowly, and gradually increase the intensity of the exercise over time. Your health care provider or diabetes educator can help create an activity plan that works for you. General recommendations include:  Encouraging children to engage in at least 60 minutes of physical activity each day.  Stretching and performing strength training exercises, such as yoga or weight lifting, at least 2 times per week.  Performing a total of at least 150 minutes of moderate-intensity exercise each week, such as brisk walking or water aerobics.  Exercising at least 3 days per week, making sure you allow no more than 2 consecutive days to pass without exercising.  Avoiding long periods of inactivity (90 minutes or more). When you  have to spend an extended period of time sitting down, take frequent breaks to walk or stretch. RECOMMENDATIONS FOR EXERCISING WITH TYPE 1 OR TYPE 2 DIABETES   Check your blood glucose before exercising. If blood glucose levels are greater than 240 mg/dL, check for urine ketones. Do not exercise if ketones are present.  Avoid injecting insulin into areas of the body that are going to be exercised. For example, avoid injecting insulin into:  The arms when playing tennis.  The legs when jogging.  Keep a record of:  Food intake before and after you exercise.  Expected peak times of insulin action.  Blood glucose levels before and after you exercise.  The type and amount of exercise you have done.  Review your records with your health care provider. Your health care provider will help you to develop guidelines for adjusting food intake and insulin amounts before and after exercising.  If you take insulin or oral hypoglycemic agents, watch for signs and symptoms of hypoglycemia. They include:  Dizziness.  Shaking.  Sweating.  Chills.  Confusion.  Drink plenty of water while you exercise to prevent dehydration or heat stroke. Body water is lost during exercise and must be replaced.  Talk to your health care provider before starting an exercise program to make sure it is safe for you. Remember, almost any type of activity is better than none.   This information is not intended to replace advice given to you by your health care provider. Make sure you discuss any questions you have with your health care provider.   Document Released: 03/08/2004 Document Revised: 05/03/2015 Document Reviewed: 05/26/2013 Elsevier Interactive Patient Education 2016 Elsevier Inc. Basic Carbohydrate Counting for Diabetes Mellitus Carbohydrate counting   is a method for keeping track of the amount of carbohydrates you eat. Eating carbohydrates naturally increases the level of sugar (glucose) in your  blood, so it is important for you to know the amount that is okay for you to have in every meal. Carbohydrate counting helps keep the level of glucose in your blood within normal limits. The amount of carbohydrates allowed is different for every person. A dietitian can help you calculate the amount that is right for you. Once you know the amount of carbohydrates you can have, you can count the carbohydrates in the foods you want to eat. Carbohydrates are found in the following foods:  Grains, such as breads and cereals.  Dried beans and soy products.  Starchy vegetables, such as potatoes, peas, and corn.  Fruit and fruit juices.  Milk and yogurt.  Sweets and snack foods, such as cake, cookies, candy, chips, soft drinks, and fruit drinks. CARBOHYDRATE COUNTING There are two ways to count the carbohydrates in your food. You can use either of the methods or a combination of both. Reading the "Nutrition Facts" on Packaged Food The "Nutrition Facts" is an area that is included on the labels of almost all packaged food and beverages in the [place]. It includes the serving size of that food or beverage and information about the nutrients in each serving of the food, including the grams (g) of carbohydrate per serving.  Decide the number of servings of this food or beverage that you will be able to eat or drink. Multiply that number of servings by the number of grams of carbohydrate that is listed on the label for that serving. The total will be the amount of carbohydrates you will be having when you eat or drink this food or beverage. Learning Standard Serving Sizes of Food When you eat food that is not packaged or does not include "Nutrition Facts" on the label, you need to measure the servings in order to count the amount of carbohydrates.A serving of most carbohydrate-rich foods contains about 15 g of carbohydrates. The following list includes serving sizes of carbohydrate-rich foods that  provide 15 g ofcarbohydrate per serving:   1 slice of bread (1 oz) or 1 six-inch tortilla.    of a hamburger bun or English muffin.  4-6 crackers.   cup unsweetened dry cereal.    cup hot cereal.   cup rice or pasta.    cup mashed potatoes or  of a large baked potato.  1 cup fresh fruit or one small piece of fruit.    cup canned or frozen fruit or fruit juice.  1 cup milk.   cup plain fat-free yogurt or yogurt sweetened with artificial sweeteners.   cup cooked dried beans or starchy vegetable, such as peas, corn, or potatoes.  Decide the number of standard-size servings that you will eat. Multiply that number of servings by 15 (the grams of carbohydrates in that serving). For example, if you eat 2 cups of strawberries, you will have eaten 2 servings and 30 g of carbohydrates (2 servings x 15 g = 30 g). For foods such as soups and casseroles, in which more than one food is mixed in, you will need to count the carbohydrates in each food that is included. EXAMPLE OF CARBOHYDRATE COUNTING Sample Dinner  3 oz chicken breast.   cup of brown rice.   cup of corn.  1 cup milk.   1 cup strawberries with sugar-free whipped topping.  Carbohydrate Calculation Step   1: Identify the foods that contain carbohydrates:   Rice.   Corn.   Milk.   Strawberries. Step 2:Calculate the number of servings eaten of each:   2 servings of rice.   1 serving of corn.   1 serving of milk.   1 serving of strawberries. Step 3: Multiply each of those number of servings by 15 g:   2 servings of rice x 15 g = 30 g.   1 serving of corn x 15 g = 15 g.   1 serving of milk x 15 g = 15 g.   1 serving of strawberries x 15 g = 15 g. Step 4: Add together all of the amounts to find the total grams of carbohydrates eaten: 30 g + 15 g + 15 g + 15 g = 75 g.   This information is not intended to replace advice given to you by your health care provider. Make sure you  discuss any questions you have with your health care provider.   Document Released: 12/17/2005 Document Revised: 01/07/2015 Document Reviewed: 11/13/2013 Elsevier Interactive Patient Education 2016 Elsevier Inc.  

## 2016-01-17 ENCOUNTER — Telehealth: Payer: Self-pay | Admitting: Internal Medicine

## 2016-01-17 NOTE — Telephone Encounter (Signed)
Patient verified DOB Patients wife informed of prescription being written and placed at [facility] Wife also advised to pick up OTC cuff if she is unable to make to the clinic to pickup paper prescription. Wife stated she would look tomorrow for a cuff in her price range and inform Medical Assistant of which option she was able to use. Wife had no further questions.

## 2016-01-17 NOTE — Telephone Encounter (Signed)
Pt. Wife called requesting to speak to nurse regarding pt. Blood pressure cuffs. Please f/u.

## 2016-01-18 ENCOUNTER — Other Ambulatory Visit: Payer: Self-pay | Admitting: Nephrology

## 2016-01-18 DIAGNOSIS — N183 Chronic kidney disease, stage 3 unspecified: Secondary | ICD-10-CM

## 2016-01-19 ENCOUNTER — Ambulatory Visit (INDEPENDENT_AMBULATORY_CARE_PROVIDER_SITE_OTHER): Payer: Medicaid Other | Admitting: Podiatry

## 2016-01-19 ENCOUNTER — Encounter: Payer: Self-pay | Admitting: Podiatry

## 2016-01-19 VITALS — BP 132/88 | HR 81 | Resp 14

## 2016-01-19 DIAGNOSIS — L97501 Non-pressure chronic ulcer of other part of unspecified foot limited to breakdown of skin: Secondary | ICD-10-CM

## 2016-01-19 DIAGNOSIS — L97529 Non-pressure chronic ulcer of other part of left foot with unspecified severity: Principal | ICD-10-CM

## 2016-01-19 DIAGNOSIS — E10621 Type 1 diabetes mellitus with foot ulcer: Secondary | ICD-10-CM

## 2016-01-19 DIAGNOSIS — L97519 Non-pressure chronic ulcer of other part of right foot with unspecified severity: Secondary | ICD-10-CM

## 2016-01-19 DIAGNOSIS — E1151 Type 2 diabetes mellitus with diabetic peripheral angiopathy without gangrene: Secondary | ICD-10-CM

## 2016-01-19 NOTE — Progress Notes (Addendum)
Subjective:     Patient ID: Dwayne [name], male   DOB: [birth year], 36 y.o.   MRN: [id number]  HPI this patient returns to the office for treatment of the ulcers on both feet. The ulcer under his second and third metastases and under his big toe joint of his right foot have healed nicely with a hyperkeratotic roof. There is a ulcer noted under the big toe of the right foot. There is drainage coming from this ulcer and he has been soaking this at home. He also has a large healing ulcer under he is been walking with a Cam Walker for the last 2 weeks and there is less dead tissue in drainage noted from the site. This ulcer appears to be healing at this time. He presents the office for an evaluation and treatment of this condition.  Blood sugar is between 200-230   Review of Systems     Objective:   Physical Exam  Objective:   Physical Exam GENERAL APPEARANCE: Alert, conversant.. Appropriately groomed. No acute distress.  VASCULAR: Pedal pulses palpable at [facility] and PT bilateral. Capillary refill time is immediate to all digits, Normal temperature gradient. Digital hair growth is present bilateral  NEUROLOGIC: sensation is normal to 5.07 monofilament at 5/5 sites bilateral. Light touch is intact bilateral, Muscle strength normal.  MUSCULOSKELETAL: acceptable muscle strength, tone and stability bilateral. Intrinsic muscluature intact bilateral. Rectus appearance of foot and digits noted bilateral.   DERMATOLOGIC: skin color, texture, and turgor are within normal limits. Digital nails are asymptomatic. No drainage noted. ULCER The ulcer under his big toe joint has healthy hyperkeratotic roof with normal skin developing under the roof. No redness or swelling. The right hallux ulcer hsas healthy granulation tissue an size is 20 mm. X 20 mm.No infection. The ulcer sub 5th has healthy hyperkeratotic roof. The ulcer under forefoot left is 30 mm. X 30 mm. No redness or infection. Necrotic  tissue noted especia;lly at proximal aspect of ulcer. No infection or drainage.             Assessment:     Diabetic ulcer B/L  Diabetic neuropathy    Plan:     Debride necrotic tissue.  Neosporin/DSD right hallux.  Debride necrotic tissue left forefoot with DSD applied.  Continue soaks and slivadene right hallux.  Walk with cam walker. If this condition worsens or becomes very painful, the patient was told to contact this office or go to the Emergency Department at the hospital.  Helane Gunther DPM

## 2016-01-20 ENCOUNTER — Encounter: Payer: Self-pay | Admitting: Clinical

## 2016-01-20 NOTE — Progress Notes (Signed)
[facility], [facility], [facility], and [facility] all say no, they are not taking Medicaid patient needing Skilled Nursing-Complex Wound Care patient at this time. [facility] says to call them back on Monday morning, as they may have staff availability at that time. Telephone call to patient to inform him that [facility] is still working on finding him home health care; pt states that his wife is taking care of him before and after work.

## 2016-01-23 ENCOUNTER — Encounter: Payer: Self-pay | Admitting: Clinical

## 2016-01-23 NOTE — Progress Notes (Signed)
There are no home health agencies found that have staffing availability to take Medicaid patient needing skilled nursing, complex wound care, including: [facility], [facility], [place], [facility], [facility], [facility], [place], [facility], [facility], [facility], [facility], [place], [facility], [facility] and [facility].

## 2016-01-24 ENCOUNTER — Ambulatory Visit
Admission: RE | Admit: 2016-01-24 | Discharge: 2016-01-24 | Disposition: A | Discharge status: Home / Self Care | Payer: Medicaid Other | Source: Ambulatory Visit | Attending: Nephrology | Admitting: Nephrology

## 2016-01-24 DIAGNOSIS — N183 Chronic kidney disease, stage 3 unspecified: Secondary | ICD-10-CM

## 2016-01-25 ENCOUNTER — Telehealth: Payer: Self-pay | Admitting: Clinical

## 2016-01-25 NOTE — Telephone Encounter (Signed)
F/U with patient to let him know to expect a call from [facility] home health group to set up a time for first home visit for skilled nursing, complex wound care, and that if he has any further questions or concerns, to contact [facility] at the main line [phone] or talk to [facility] at [phone]; Dwayne [name] is in agreement, says he needs help during the week only, as his wife is off work on the weekends, and he agrees to discuss days and times with home health agency.

## 2016-01-26 ENCOUNTER — Other Ambulatory Visit: Payer: Self-pay | Admitting: *Deleted

## 2016-02-01 ENCOUNTER — Ambulatory Visit: Payer: Medicaid Other | Attending: Internal Medicine | Admitting: Pharmacist

## 2016-02-01 ENCOUNTER — Encounter: Payer: Self-pay | Admitting: Pharmacist

## 2016-02-01 VITALS — BP 145/97 | HR 92

## 2016-02-01 DIAGNOSIS — E1042 Type 1 diabetes mellitus with diabetic polyneuropathy: Secondary | ICD-10-CM | POA: Diagnosis not present

## 2016-02-01 DIAGNOSIS — I10 Essential (primary) hypertension: Secondary | ICD-10-CM | POA: Diagnosis not present

## 2016-02-01 LAB — GLUCOSE, POCT (MANUAL RESULT ENTRY): POC Glucose: 192 mg/dl — AB (ref 70–99)

## 2016-02-01 NOTE — Patient Instructions (Signed)
Thank you for coming to see me today!  I want you to come back in 2 weeks your with blood sugar meter and ALL of your medications.  I am going to follow up with the kidney doctor and see what she wants you to be on to make sure we are all on the same page

## 2016-02-01 NOTE — Progress Notes (Signed)
S:    Patient arrives in good spirits.  Presents for diabetes evaluation, education, and management at the request of Dr. Hyman Hopes. Patient was referred on 01/16/16.  Patient was last seen by Primary Care Provider on 01/16/16.   Patient reports adherence with medications. However, he has only been taking the hydralazine and clonidine twice a day because that is what the kidney doctor told him to do in the past.   Current diabetes medications include: Novolog 6 units TID, Lantus 15 units daily  Current hypertension medications include: amlodipine 10 mg daily, carvedilol 6.25 mg BID, furosemide 40 mg BID, hydralazine 50 mg TID, clonidine 0.3 mg TID.  Patient denies hypoglycemic events.  Patient reported exercise habits: none - in a boot due to foot infection   Patient reports nocturia.  Patient reports neuropathy. Patient reports visual chronic changes. Patient reports self foot exams.    O:  Lab Results  Component Value Date   HGBA1C 9.50 01/16/2016   Filed Vitals:   02/01/16 1451  BP: 145/97  Pulse: 92   POCT glucose = 192  Reported (patient does not have meter or log with him) Home fasting CBG: 130s-250s  Lifetime ASCVD risk: 69%  A/P: Diabetes longstanding currently uncontrolled based on A1c of 9.5. Patient denies hypoglycemic events and is able to verbalize appropriate hypoglycemia management plan. Patient reports adherence with medication. Control is suboptimal due to sedentary lifestyle.  Continue Lantus 15 units daily and Novolog 6 units TID with meals. Without any blood glucose readings, I do not want to change the dose, but I expect to have to increase the Lantus dose at the next visit. Patient to continue to monitor blood glucose at home and bring meter or log book to next visit.   Next A1C anticipated April 2017.    Hypertension longstanding currently uncontrolled based blood pressure of 145/97. Patient was told by nephrologist to change doses of some of the blood  pressure medications so I will request records from nephrology to make sure our medication lists match. Control is suboptimal due to sedentary lifestyle. Continue current medications and will reassess once I have records from nephrology. Instructed patient to bring all of his medications to the next visit so I can complete a medication review and make sure that he has all of the medications that he needs. I am wondering if he is not taking his medications appropriately and this is resulting in resistant hypertension.   Patient refused influenza vaccination despite education on the benefits of vaccination.   Written patient instructions provided.  Total time in face to face counseling 20 minutes.  Follow up in Pharmacist Clinic Visit in 2 weeks.

## 2016-02-02 ENCOUNTER — Ambulatory Visit (INDEPENDENT_AMBULATORY_CARE_PROVIDER_SITE_OTHER): Payer: Medicaid Other | Admitting: Podiatry

## 2016-02-02 ENCOUNTER — Encounter: Payer: Self-pay | Admitting: Podiatry

## 2016-02-02 VITALS — BP 134/84 | HR 83 | Resp 14

## 2016-02-02 DIAGNOSIS — L97501 Non-pressure chronic ulcer of other part of unspecified foot limited to breakdown of skin: Secondary | ICD-10-CM

## 2016-02-02 DIAGNOSIS — L97519 Non-pressure chronic ulcer of other part of right foot with unspecified severity: Principal | ICD-10-CM

## 2016-02-02 DIAGNOSIS — L97529 Non-pressure chronic ulcer of other part of left foot with unspecified severity: Principal | ICD-10-CM

## 2016-02-02 DIAGNOSIS — E10621 Type 1 diabetes mellitus with foot ulcer: Secondary | ICD-10-CM

## 2016-02-02 NOTE — Progress Notes (Signed)
Subjective:     Patient ID: Dwayne [name], male   DOB: 02-Jul-1980, 36 y.o.   MRN: [id number]  HPI this patient returns to the office for treatment of the ulcers on both feet. The ulcer under his second and third metastases and under his big toe joint of his right foot have healed nicely with a hyperkeratotic roof. There is a ulcer noted under the big toe of the right foot which has reduced its size.. There is no  drainage coming from this ulcer and he has been soaking this at home. He also has a large healing ulcer.  he is been walking with a Cam Walker for the last 2 weeks and there is less dead tissue in drainage noted from the site. This ulcer appears to be healing at this time. He presents the office for an evaluation and treatment of this condition.  Blood sugar is between 200-230   Review of Systems     Objective:   Physical Exam  Objective:   Physical Exam GENERAL APPEARANCE: Alert, conversant.. Appropriately groomed. No acute distress.  VASCULAR: Pedal pulses palpable at [facility] and PT bilateral. Capillary refill time is immediate to all digits, Normal temperature gradient. Digital hair growth is present bilateral  NEUROLOGIC: sensation is normal to 5.07 monofilament at 5/5 sites bilateral. Light touch is intact bilateral, Muscle strength normal.  MUSCULOSKELETAL: acceptable muscle strength, tone and stability bilateral. Intrinsic muscluature intact bilateral. Rectus appearance of foot and digits noted bilateral.   DERMATOLOGIC: skin color, texture, and turgor are within normal limits. Digital nails are asymptomatic. No drainage noted. ULCER The ulcer under his big toe joint has healthy hyperkeratotic roof with normal skin developing under the roof. No redness or swelling. The right hallux ulcer has healthy granulation tissue an size is  10  mm. X  8  mm.No infection. The ulcer sub 5th has healthy hyperkeratotic roof. The ulcer under forefoot left is 30 mm. X 30 mm  which is  healing with hyperkeratotic roof. No redness or infection. Necrotic tissue noted  at proximal aspect of ulcer has healed and no drainage to be found.             Assessment:     Diabetic ulcer B/L  Diabetic neuropathy    Plan:     Debride necrotic tissue.  Neosporin/DSD right hallux.  Debride hyperkeratotic  tissue right foot.  Continue soaks and slivadene right hallux.  Walk with cam walker. If this condition worsens or becomes very painful, the patient was told to contact this office or go to the Emergency Department at the hospital. There is marked improvement at this visit and he will be seen back in three weeks   Helane Gunther DPM

## 2016-02-15 ENCOUNTER — Other Ambulatory Visit: Payer: Self-pay | Admitting: *Deleted

## 2016-02-15 ENCOUNTER — Telehealth: Payer: Self-pay | Admitting: Internal Medicine

## 2016-02-15 ENCOUNTER — Ambulatory Visit: Payer: Self-pay | Admitting: Pharmacist

## 2016-02-15 DIAGNOSIS — K3184 Gastroparesis: Principal | ICD-10-CM

## 2016-02-15 DIAGNOSIS — E1143 Type 2 diabetes mellitus with diabetic autonomic (poly)neuropathy: Secondary | ICD-10-CM

## 2016-02-15 MED ORDER — METOCLOPRAMIDE HCL 10 MG PO TABS: 10.0000 mg | ORAL_TABLET | Freq: Three times a day (TID) | ORAL | Status: DC

## 2016-02-15 NOTE — Telephone Encounter (Signed)
Patients Reglan was refilled

## 2016-02-15 NOTE — Telephone Encounter (Signed)
Pt. Called requesting a med refill on metoCLOPramide (REGLAN) 10 MG tablet. Please f/u with pt.

## 2016-02-15 NOTE — Telephone Encounter (Signed)
Patient was last seen on 02/01/16. Patient is requesting Reglan refill.

## 2016-02-16 ENCOUNTER — Encounter: Payer: Self-pay | Admitting: Internal Medicine

## 2016-02-16 ENCOUNTER — Ambulatory Visit: Payer: Self-pay | Admitting: Pharmacist

## 2016-02-17 NOTE — Telephone Encounter (Signed)
Paperwork has been corrected and refaxed

## 2016-02-17 NOTE — Telephone Encounter (Signed)
Pt. Wife called stating that pt. PCP sent a request to [facility] for the Pt. To receive home health services and the PCP put the facilities NPI on the form and they need the PCP NPI. Please f/u   [facility]- [phone]

## 2016-02-20 ENCOUNTER — Ambulatory Visit (INDEPENDENT_AMBULATORY_CARE_PROVIDER_SITE_OTHER): Payer: Medicaid Other | Admitting: Thoracic Surgery (Cardiothoracic Vascular Surgery)

## 2016-02-20 ENCOUNTER — Encounter: Payer: Self-pay | Admitting: Thoracic Surgery (Cardiothoracic Vascular Surgery)

## 2016-02-20 VITALS — BP 124/86 | HR 70 | Resp 16 | Ht 74.0 in | Wt 233.0 lb

## 2016-02-20 DIAGNOSIS — Z9889 Other specified postprocedural states: Secondary | ICD-10-CM | POA: Diagnosis not present

## 2016-02-20 DIAGNOSIS — B958 Unspecified staphylococcus as the cause of diseases classified elsewhere: Secondary | ICD-10-CM | POA: Diagnosis not present

## 2016-02-20 DIAGNOSIS — I330 Acute and subacute infective endocarditis: Secondary | ICD-10-CM

## 2016-02-20 DIAGNOSIS — I340 Nonrheumatic mitral (valve) insufficiency: Secondary | ICD-10-CM

## 2016-02-20 NOTE — Progress Notes (Signed)
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 He was last seen in our office on 05/16/2015. Since then he has remained clinically stable from a cardiac standpoint. He has been followed regularly by Dr. Moshe Cipro from nephrology, his primary care physician Dr. Doreene Burke, and Dr. Prudence Davidson from podiatry.  He reports no specific complaints. He states that he does get short of breath with activity. He also has occasional chest tightness. Both can occur with or without activity. He remains very limited physically. He admits that he feels much better than he did at the time of his acute illness one year ago. He has not had fevers or chills. He has struggled with consistent blood pressure control.   Current Outpatient Prescriptions  Medication Sig Dispense Refill  . acetaminophen-codeine (TYLENOL #3) 300-30 MG tablet Take 1 tablet by mouth every 4 (four) hours as needed. 60 tablet 0  . amLODipine (NORVASC) 10 MG tablet Take 1 tablet (10 mg total) by mouth daily. 90 tablet 3  . Blood Pressure Monitoring (ADULT BLOOD PRESSURE CUFF LG) KIT Measure BP 2x daily 1 each 0  . carvedilol (COREG) 6.25 MG tablet Take 1 tablet (6.25 mg total) by mouth 2 (two) times daily with a meal. 180 tablet 3  . cloNIDine (CATAPRES) 0.3 MG tablet Take 1 tablet (0.3 mg total) by mouth 3 (three) times  daily. 90 tablet 3  . cyclobenzaprine (FLEXERIL) 10 MG tablet Take 1 tablet (10 mg total) by mouth 2 (two) times daily as needed for muscle spasms. 30 tablet 3  . furosemide (LASIX) 40 MG tablet Take 1-2 tablets (40-80 mg total) by mouth 2 (two) times daily. Takes 59m in am and 827min pm 180 tablet 3  . glucose blood (ACCU-CHEK AVIVA PLUS) test strip Use as instructed 100 each 12  . hydrALAZINE (APRESOLINE) 50 MG tablet Take 1 tablet (50 mg total) by mouth 3 (three) times daily. 270 tablet 3  . insulin aspart (NOVOLOG FLEXPEN) 100 UNIT/ML FlexPen Inject 6 Units into the skin 3 (three) times daily with meals. 15 mL 3  . Insulin Glargine (LANTUS SOLOSTAR) 100 UNIT/ML Solostar Pen Inject 15 Units into the skin daily at 10 pm. 3 pen 3  . Insulin Pen Needle (B-D UF III MINI PEN NEEDLES) 31G X 5 MM MISC Use one needle with each injection of Lantus Pen daily. 100 each 3  . Insulin Syringe-Needle U-100 28G X 1/2" 0.5 ML MISC Use as directed. Any generic available. 100 each 12  . Lancets (ACCU-CHEK SOFT TOUCH) lancets Use as instructed 100 each 12  . metoCLOPramide (REGLAN) 10 MG tablet Take 1 tablet (10 mg total) by mouth 4 (four) times daily -  before meals and at bedtime. 60 tablet 0  . potassium chloride SA (K-DUR,KLOR-CON) 20 MEQ tablet Take 2 tablets daily 60 tablet 3  . silver sulfADIAZINE (  SILVADENE) 1 % cream Apply 1 application topically 2 (two) times daily. 50 g 0  . UNABLE TO FIND Reported on 01/16/2016     No current facility-administered medications for this visit.      Physical Exam:   BP 124/86 mmHg  Pulse 70  Resp 16  Ht _0  (1.88 m)  Wt 233 lb (105.688 kg)  BMI 29.90 kg/m2  SpO2 98%  General:  36 African-American male in no distress  Chest:   Clear to auscultation with symmetrical breath sounds  CV:   Regular rate and rhythm without murmur  Incisions:  Completely healed  Abdomen:  Soft nontender  Extremities:  Warm and well-perfused with 1+ bilateral lower extremity  edema  Diagnostic Tests:  Transthoracic Echocardiography  Patient:  Dwayne [name], Dwayne [name] MR #:    [id number] Study Date: 03/31/2015 Gender:   M Age:    36 Height:   188 cm Weight:   97.5 kg BSA:    2.27 m^2 Pt. Status: Room:  ATTENDING  Letha Cape, Scott T REFERRING  [name] T SONOGRAPHER Cindy Hazy, RDCS PERFORMING  [facility], Outpatient  cc:  ------------------------------------------------------------------- LV EF: 60% -  65%  ------------------------------------------------------------------- Indications:   Z98.89 Mitral Valve Repair.  ------------------------------------------------------------------- History:  PMH: Acquired from the patient and from the patient&'s chart. PMH: Endocarditis. Acute Pericarditis. CHF. Peripheral Vascular Disease. Chronic Kidney Disease. Bacteremia. Septic Shock. Risk factors: Current tobacco use. Hypertension. Diabetes mellitus.  ------------------------------------------------------------------- Study Conclusions  - Left ventricle: The cavity size was normal. There was severe concentric hypertrophy. Systolic function was normal. The estimated ejection fraction was in the range of 60% to 65%. Wall motion was normal; there were no regional wall motion abnormalities. Features are consistent with a pseudonormal left ventricular filling pattern, with concomitant abnormal relaxation and increased filling pressure (grade 2 diastolic dysfunction). - Mitral valve: Prior procedures included surgical repair. An annular ring prosthesis was present and functioning normally. There was no significant regurgitation. Mean gradient (D): 6 mm Hg. - Left atrium: The atrium was mildly dilated.  Impressions:  - There was no evidence of a vegetation.  ------------------------------------------------------------------- Labs, prior tests, procedures, and  surgery: Status post Mitral Valve Repair.-28 mm Sorin Memo 3D Annuloplasty via right Mini Thoracotomy.     Transthoracic echocardiography. M-mode, complete 2D, spectral Doppler, and color Doppler. Birthdate: Patient birthdate: [birth year]. Age: Patient is 36 yr old. Sex: Gender: male.  BMI: 27.6 kg/m^2. Blood pressure:   185/116 Patient status: Outpatient. Study date: Study date: 03/31/2015. Study time: 09:39 AM. Location: [facility]  -------------------------------------------------------------------  ------------------------------------------------------------------- Left ventricle: The cavity size was normal. There was severe concentric hypertrophy. Systolic function was normal. The estimated ejection fraction was in the range of 60% to 65%. Wall motion was normal; there were no regional wall motion abnormalities. Features are consistent with a pseudonormal left ventricular filling pattern, with concomitant abnormal relaxation and increased filling pressure (grade 2 diastolic dysfunction).  ------------------------------------------------------------------- Aortic valve:  Trileaflet; normal thickness leaflets. Mobility was not restricted. Doppler: Transvalvular velocity was within the normal range. There was no stenosis. There was no regurgitation.  ------------------------------------------------------------------- Aorta: Aortic root: The aortic root was normal in size.  ------------------------------------------------------------------- Mitral valve: Prior procedures included surgical repair. An annular ring prosthesis was present and functioning normally. Doppler: Transvalvular velocity was minimally elevated. There was no evidence for stenosis. There was no significant regurgitation. Valve area by pressure half-time: 1.57 cm^2. Indexed valve area by pressure half-time: 0.69 cm^2/m^2. Valve area  by continuity equation (using LVOT flow):  1.07 cm^2. Indexed valve area by continuity equation (using LVOT flow): 0.47 cm^2/m^2.  Mean gradient (D): 6 mm Hg. Peak gradient (D): 9 mm Hg.  ------------------------------------------------------------------- Left atrium: The atrium was mildly dilated.  ------------------------------------------------------------------- Right ventricle: The cavity size was normal. Wall thickness was normal. Systolic function was normal.  ------------------------------------------------------------------- Pulmonic valve:  Poorly visualized. Structurally normal valve. Cusp separation was normal. Doppler: Transvalvular velocity was within the normal range. There was no evidence for stenosis. There was no regurgitation.  ------------------------------------------------------------------- Tricuspid valve:  Structurally normal valve.  Doppler: Transvalvular velocity was within the normal range. There was no regurgitation.  ------------------------------------------------------------------- Pulmonary artery:  The main pulmonary artery was normal-sized. Systolic pressure was within the normal range.  ------------------------------------------------------------------- Right atrium: The atrium was normal in size.  ------------------------------------------------------------------- Pericardium: There was no pericardial effusion.  ------------------------------------------------------------------- Systemic veins: Inferior vena cava: The vessel was normal in size.  ------------------------------------------------------------------- Measurements  Left ventricle              Value     Reference LV ID, ED, PLAX chordal      (L)   41  mm    43 - 52 LV ID, ES, PLAX chordal          28  mm    23 - 38 LV fx shortening, PLAX chordal      32  %    >=29 LV PW thickness, ED            19  mm    --------- IVS/LV PW  ratio, ED            0.84      <=1.3 Stroke volume, 2D             70  ml    --------- Stroke volume/bsa, 2D           31  ml/m^2  --------- LV e&', lateral              7.9  cm/s   --------- LV E/e&', lateral             18.99     --------- LV e&', medial               4.67 cm/s   --------- LV E/e&', medial              32.12     --------- LV e&', average              6.29 cm/s   --------- LV E/e&', average             23.87     ---------  Ventricular septum            Value     Reference IVS thickness, ED             16  mm    ---------  LVOT                   Value     Reference LVOT ID, S                19  mm    --------- LVOT area                 2.84 cm^2   --------- LVOT mean velocity, S           85.3 cm/s   --------- LVOT VTI, S  24.5 cm    ---------  Aorta                   Value     Reference Aortic root ID, ED            33  mm    ---------  Left atrium                Value     Reference LA ID, A-P, ES              43  mm    --------- LA ID/bsa, A-P              1.89 cm/m^2  <=2.2 LA volume, S               62.8 ml    --------- LA volume/bsa, S             27.7 ml/m^2  --------- LA volume, ES, 1-p A4C          62.2 ml    --------- LA volume/bsa, ES, 1-p A4C        27.4 ml/m^2  --------- LA volume, ES, 1-p A2C          58.9 ml    --------- LA volume/bsa, ES, 1-p A2C        25.9 ml/m^2  ---------  Mitral valve               Value     Reference Mitral E-wave  peak velocity        150  cm/s   --------- Mitral mean velocity, D          107  cm/s   --------- Mitral deceleration time     (H)   415  ms    150 - 230 Mitral pressure half-time         132  ms    --------- Mitral mean gradient, D          6   mm Hg  --------- Mitral peak gradient, D          9   mm Hg  --------- Mitral E/A ratio, peak          1       --------- Mitral valve area, PHT, DP        1.57 cm^2   --------- Mitral valve area/bsa, PHT, DP      0.69 cm^2/m^2 --------- Mitral valve area, LVOT          1.07 cm^2   --------- continuity Mitral valve area/bsa, LVOT        0.47 cm^2/m^2 --------- continuity Mitral annulus VTI, D           65.2 cm    ---------  Tricuspid valve              Value     Reference Tricuspid regurg peak velocity      242  cm/s   --------- Tricuspid peak RV-RA gradient       23  mm Hg  ---------  Right ventricle              Value     Reference RV s&', lateral, S             10.6 cm/s   ---------  Legend: (L) and (H) mark values outside specified reference range.  ------------------------------------------------------------------- Prepared and Electronically Authenticated by  Candee Furbish, M.D. 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    Plan:  We have not recommended any changes to the patient's current medications at this time. The patient has been reminded regarding the importance of dental hygiene and the lifelong need for antibiotic prophylaxis for all dental cleanings and other related  invasive procedures.    I spent in excess of 15 minutes during the conduct of this office consultation and >50% of this time involved direct face-to-face encounter with the patient for counseling and/or coordination of their care.   Valentina Gu. Roxy Manns, MD 02/20/2016 12:41 PM

## 2016-02-20 NOTE — Patient Instructions (Signed)

## 2016-02-22 ENCOUNTER — Encounter: Payer: Self-pay | Admitting: Pharmacist

## 2016-02-22 ENCOUNTER — Ambulatory Visit: Payer: Medicaid Other | Attending: Internal Medicine | Admitting: Pharmacist

## 2016-02-22 VITALS — BP 126/80 | HR 77

## 2016-02-22 DIAGNOSIS — E1042 Type 1 diabetes mellitus with diabetic polyneuropathy: Secondary | ICD-10-CM

## 2016-02-22 LAB — GLUCOSE, POCT (MANUAL RESULT ENTRY): POC GLUCOSE: 154 mg/dL — AB (ref 70–99)

## 2016-02-22 MED ORDER — INSULIN GLARGINE 100 UNIT/ML SOLOSTAR PEN: 16.0000 [IU] | PEN_INJECTOR | Freq: Every day | SUBCUTANEOUS | Status: DC

## 2016-02-22 NOTE — Progress Notes (Signed)
S:    Patient arrives in good spirits.  Presents for diabetes evaluation, education, and management at the request of Dr. Hyman Hopes. Patient was referred on 01/16/16.  Patient was last seen by Primary Care Provider on 01/16/16.   Patient reports adherence with medications. His wife sets all of his medications out and he takes them every morning.  Current diabetes medications include: Novolog 6 units TID, Lantus 15 units daily at night.  Current hypertension medications include: amlodipine 10 mg daily, carvedilol 6.25 mg BID, furosemide 40 mg BID, hydralazine 50 mg TID, clonidine 0.3 mg BID.  Patient's wife monitors his blood pressure at home and his home readings have ranged from 130s-170s/80s-90s. She takes his blood pressure before he takes his medications for the day.   Patient denies hypoglycemic events.  Patient reported exercise habits: none - in a boot due to foot infection   Patient reports nocturia.  Patient reports neuropathy. Patient reports visual chronic changes. Patient reports self foot exams.    O:  Lab Results  Component Value Date   HGBA1C 9.50 01/16/2016   Filed Vitals:   02/22/16 1645  BP: 126/80  Pulse: 77   POCT glucose = 154  Home fasting CBG: 108 - 316 Post-prandial CBGs:96 - 200s   A/P: Diabetes longstanding currently uncontrolled based on A1c of 9.5 and home CBGs. Patient denies hypoglycemic events and is able to verbalize appropriate hypoglycemia management plan. Patient reports adherence with medication. Control is suboptimal due to sedentary lifestyle.  Increase Lantus to 16 units daily and Novolog 6 units TID with meals. I am hesitant to increase more knowing that patient will have home health soon and the aide will assist with insulin injections so I think he will be getting more of his Novolog injections which will help to improve his blood glucose. Patient to continue to monitor blood glucose at home and bring meter or log book to next visit.    Next A1C anticipated April 2017.    Hypertension longstanding currently controlled. His readings are uncontrolled at home so I instructed patient to start taking the amlodipine at night. I think his blood pressure medications are wearing off by the time his wife takes his blood pressure in the morning so this may help keep his blood pressure controlled throughout the day and night. I have updated the patient's medication list to reflect the changes that Dr. Kathrene Bongo made.    Written patient instructions provided.  Total time in face to face counseling 20 minutes.  Follow up in Pharmacist Clinic Visit in 2 weeks or with Dr. Hyman Hopes next as patient is having continued GI upset.

## 2016-02-22 NOTE — Patient Instructions (Addendum)
Thank you for coming to see me today!  Increase your Lantus to 16 units daily. Continue Novolog 6 units with each meal.  Try to take your blood pressure medications before you take your blood pressure in the morning.   I would take your amlodipine at night to help keep your blood pressure lower first thing in the morning  Follow up with me in 2-3 weeks or with Dr. Hyman Hopes.

## 2016-02-22 NOTE — Addendum Note (Signed)
Addended by: Juanita Craver A on: 02/22/2016 05:21 PM   Modules accepted: Orders

## 2016-02-23 ENCOUNTER — Encounter: Payer: Self-pay | Admitting: Podiatry

## 2016-02-23 ENCOUNTER — Ambulatory Visit (INDEPENDENT_AMBULATORY_CARE_PROVIDER_SITE_OTHER): Payer: Medicaid Other | Admitting: Podiatry

## 2016-02-23 VITALS — BP 131/90 | HR 69 | Resp 14

## 2016-02-23 DIAGNOSIS — E10621 Type 1 diabetes mellitus with foot ulcer: Secondary | ICD-10-CM | POA: Diagnosis not present

## 2016-02-23 DIAGNOSIS — L97529 Non-pressure chronic ulcer of other part of left foot with unspecified severity: Secondary | ICD-10-CM | POA: Diagnosis not present

## 2016-02-23 DIAGNOSIS — M79609 Pain in unspecified limb: Secondary | ICD-10-CM

## 2016-02-23 DIAGNOSIS — B351 Tinea unguium: Secondary | ICD-10-CM | POA: Diagnosis not present

## 2016-02-23 DIAGNOSIS — E1151 Type 2 diabetes mellitus with diabetic peripheral angiopathy without gangrene: Secondary | ICD-10-CM

## 2016-02-23 DIAGNOSIS — L97519 Non-pressure chronic ulcer of other part of right foot with unspecified severity: Principal | ICD-10-CM

## 2016-02-23 NOTE — Progress Notes (Signed)
Subjective:     Patient ID: Dwayne [name], male   DOB: 03/25/1980, 36 y.o.   MRN: [id number]  HPI this patient presents to the office for continued evaluation and treatment of his diabetic ulcers on both feet. He presents the office today wearing no bandages on his feet and walking in his regular shoes. He is accompanied by his wife says that the ulcers have healed and he has  no drainage from any of the ulcer sites.  He was last seen 3 weeks ago and was told to continue with the home soaks and bandaging of these sites. He presents the office today for an evaluation and treatment of his diabetic ulcers   Review of Systems     Objective:   Physical Exam GENERAL APPEARANCE: Alert, conversant. Appropriately groomed. No acute distress.  VASCULAR: Pedal pulses palpable at  [facility] and PT bilateral.  Capillary refill time is immediate to all digits,  Normal temperature gradient.  Digital hair growth is present bilateral  NEUROLOGIC: sensation is diminished  to 5.07 monofilament at 5/5 sites bilateral.  Light touch is intact bilateral, Muscle strength normal.  MUSCULOSKELETAL: acceptable muscle strength, tone and stability bilateral.  Intrinsic muscluature intact bilateral.  Rectus appearance of foot and digits noted bilateral.   DERMATOLOGIC: skin color, texture, and turgor are within normal limits. .  No open lesions present.  . No drainage noted. Crust of hyperkeratotic tissue noted sub 2,3,4 left forefoot.  No signs of redness or swelling or infection.  Right foot has healed ulcers under his right forefoot and his right hallux has closed with normal skin noted. NAILS  Thick disifgured discolored nails both fewet.    Assessment:     Healing diabetic ulcer.  Onychomycosis B/L     Plan:     Debridement of crust at ulcer site left foot.  Debride nails B/L.  Told to use cam walker if these healed ulcers start breaking down again.  RTC 5 weeks.   Helane Gunther DPM

## 2016-03-09 NOTE — Telephone Encounter (Signed)
Patient is requesting medication refill for Novolog 70/30 flex pen please send to [facility]....Marland Kitchen.Marland Kitchen.please follow up with patient

## 2016-03-16 ENCOUNTER — Other Ambulatory Visit: Payer: Self-pay | Admitting: *Deleted

## 2016-03-16 DIAGNOSIS — E1042 Type 1 diabetes mellitus with diabetic polyneuropathy: Secondary | ICD-10-CM

## 2016-03-16 MED ORDER — INSULIN ASPART 100 UNIT/ML FLEXPEN: 6.0000 [IU] | PEN_INJECTOR | Freq: Three times a day (TID) | SUBCUTANEOUS | Status: DC

## 2016-03-16 NOTE — Telephone Encounter (Signed)
Patient requested Novolog refill be sent to [facility]. Refill request was sent.

## 2016-03-16 NOTE — Telephone Encounter (Signed)
[facility]

## 2016-03-22 ENCOUNTER — Ambulatory Visit: Payer: Medicaid Other | Attending: Internal Medicine | Admitting: Internal Medicine

## 2016-03-22 ENCOUNTER — Encounter: Payer: Self-pay | Admitting: Internal Medicine

## 2016-03-22 VITALS — BP 136/83 | HR 73 | Temp 98.2°F | Resp 18 | Ht 74.0 in | Wt 234.0 lb

## 2016-03-22 DIAGNOSIS — I340 Nonrheumatic mitral (valve) insufficiency: Secondary | ICD-10-CM | POA: Insufficient documentation

## 2016-03-22 DIAGNOSIS — E1042 Type 1 diabetes mellitus with diabetic polyneuropathy: Secondary | ICD-10-CM | POA: Insufficient documentation

## 2016-03-22 DIAGNOSIS — E1043 Type 1 diabetes mellitus with diabetic autonomic (poly)neuropathy: Secondary | ICD-10-CM

## 2016-03-22 DIAGNOSIS — K3184 Gastroparesis: Secondary | ICD-10-CM | POA: Diagnosis not present

## 2016-03-22 DIAGNOSIS — F4321 Adjustment disorder with depressed mood: Secondary | ICD-10-CM | POA: Insufficient documentation

## 2016-03-22 DIAGNOSIS — F329 Major depressive disorder, single episode, unspecified: Secondary | ICD-10-CM | POA: Insufficient documentation

## 2016-03-22 DIAGNOSIS — Z794 Long term (current) use of insulin: Secondary | ICD-10-CM | POA: Diagnosis not present

## 2016-03-22 DIAGNOSIS — R1013 Epigastric pain: Secondary | ICD-10-CM | POA: Insufficient documentation

## 2016-03-22 DIAGNOSIS — I10 Essential (primary) hypertension: Secondary | ICD-10-CM | POA: Diagnosis not present

## 2016-03-22 DIAGNOSIS — Z79899 Other long term (current) drug therapy: Secondary | ICD-10-CM | POA: Insufficient documentation

## 2016-03-22 DIAGNOSIS — E1021 Type 1 diabetes mellitus with diabetic nephropathy: Secondary | ICD-10-CM

## 2016-03-22 DIAGNOSIS — Z9889 Other specified postprocedural states: Secondary | ICD-10-CM | POA: Diagnosis not present

## 2016-03-22 MED ORDER — PANTOPRAZOLE SODIUM 40 MG PO TBEC: 40.0000 mg | DELAYED_RELEASE_TABLET | Freq: Every day | ORAL | Status: AC

## 2016-03-22 MED ORDER — METOCLOPRAMIDE HCL 10 MG PO TABS: 10.0000 mg | ORAL_TABLET | Freq: Three times a day (TID) | ORAL | Status: DC

## 2016-03-22 MED ORDER — ONDANSETRON HCL 4 MG PO TABS: 4.0000 mg | ORAL_TABLET | Freq: Three times a day (TID) | ORAL | Status: DC | PRN

## 2016-03-22 MED ORDER — PAROXETINE HCL 20 MG PO TABS: 20.0000 mg | ORAL_TABLET | Freq: Every day | ORAL | Status: DC

## 2016-03-22 MED ORDER — INSULIN GLARGINE 100 UNIT/ML SOLOSTAR PEN: 20.0000 [IU] | PEN_INJECTOR | Freq: Every day | SUBCUTANEOUS | Status: DC

## 2016-03-22 NOTE — Progress Notes (Signed)
Patient is here for Abdominal Pain  Patient complains of abdominal pains described as gas and bloated. Pain is currently scaled at a 5.

## 2016-03-22 NOTE — Patient Instructions (Addendum)
Diabetes and Foot Care Diabetes may cause you to have problems because of poor blood supply (circulation) to your feet and legs. This may cause the skin on your feet to become thinner, break easier, and heal more slowly. Your skin may become dry, and the skin may peel and crack. You may also have nerve damage in your legs and feet causing decreased feeling in them. You may not notice minor injuries to your feet that could lead to infections or more serious problems. Taking care of your feet is one of the most important things you can do for yourself.  HOME CARE INSTRUCTIONS  Wear shoes at all times, even in the house. Do not go barefoot. Bare feet are easily injured.  Check your feet daily for blisters, cuts, and redness. If you cannot see the bottom of your feet, use a mirror or ask someone for help.  Wash your feet with warm water (do not use hot water) and mild soap. Then pat your feet and the areas between your toes until they are completely dry. Do not soak your feet as this can dry your skin.  Apply a moisturizing lotion or petroleum jelly (that does not contain alcohol and is unscented) to the skin on your feet and to dry, brittle toenails. Do not apply lotion between your toes.  Trim your toenails straight across. Do not dig under them or around the cuticle. File the edges of your nails with an emery board or nail file.  Do not cut corns or calluses or try to remove them with medicine.  Wear clean socks or stockings every day. Make sure they are not too tight. Do not wear knee-high stockings since they may decrease blood flow to your legs.  Wear shoes that fit properly and have enough cushioning. To break in new shoes, wear them for just a few hours a day. This prevents you from injuring your feet. Always look in your shoes before you put them on to be sure there are no objects inside.  Do not cross your legs. This may decrease the blood flow to your feet.  If you find a minor scrape,  cut, or break in the skin on your feet, keep it and the skin around it clean and dry. These areas may be cleansed with mild soap and water. Do not cleanse the area with peroxide, alcohol, or iodine.  When you remove an adhesive bandage, be sure not to damage the skin around it.  If you have a wound, look at it several times a day to make sure it is healing.  Do not use heating pads or hot water bottles. They may burn your skin. If you have lost feeling in your feet or legs, you may not know it is happening until it is too late.  Make sure your health care provider performs a complete foot exam at least annually or more often if you have foot problems. Report any cuts, sores, or bruises to your health care provider immediately. SEEK MEDICAL CARE IF:   You have an injury that is not healing.  You have cuts or breaks in the skin.  You have an ingrown nail.  You notice redness on your legs or feet.  You feel burning or tingling in your legs or feet.  You have pain or cramps in your legs and feet.  Your legs or feet are numb.  Your feet always feel cold. SEEK IMMEDIATE MEDICAL CARE IF:   There is increasing redness,   swelling, or pain in or around a wound.  There is a red line that goes up your leg.  Pus is coming from a wound.  You develop a fever or as directed by your health care provider.  You notice a bad smell coming from an ulcer or wound.   This information is not intended to replace advice given to you by your health care provider. Make sure you discuss any questions you have with your health care provider.   Document Released: 12/14/2000 Document Revised: 08/19/2013 Document Reviewed: 05/26/2013 Elsevier Interactive Patient Education 2016 Elsevier Inc. Basic Carbohydrate Counting for Diabetes Mellitus Carbohydrate counting is a method for keeping track of the amount of carbohydrates you eat. Eating carbohydrates naturally increases the level of sugar (glucose) in your  blood, so it is important for you to know the amount that is okay for you to have in every meal. Carbohydrate counting helps keep the level of glucose in your blood within normal limits. The amount of carbohydrates allowed is different for every person. A dietitian can help you calculate the amount that is right for you. Once you know the amount of carbohydrates you can have, you can count the carbohydrates in the foods you want to eat. Carbohydrates are found in the following foods:  Grains, such as breads and cereals.  Dried beans and soy products.  Starchy vegetables, such as potatoes, peas, and corn.  Fruit and fruit juices.  Milk and yogurt.  Sweets and snack foods, such as cake, cookies, candy, chips, soft drinks, and fruit drinks. CARBOHYDRATE COUNTING There are two ways to count the carbohydrates in your food. You can use either of the methods or a combination of both. Reading the "Nutrition Facts" on Packaged Food The "Nutrition Facts" is an area that is included on the labels of almost all packaged food and beverages in the [place]. It includes the serving size of that food or beverage and information about the nutrients in each serving of the food, including the grams (g) of carbohydrate per serving.  Decide the number of servings of this food or beverage that you will be able to eat or drink. Multiply that number of servings by the number of grams of carbohydrate that is listed on the label for that serving. The total will be the amount of carbohydrates you will be having when you eat or drink this food or beverage. Learning Standard Serving Sizes of Food When you eat food that is not packaged or does not include "Nutrition Facts" on the label, you need to measure the servings in order to count the amount of carbohydrates.A serving of most carbohydrate-rich foods contains about 15 g of carbohydrates. The following list includes serving sizes of carbohydrate-rich foods that  provide 15 g ofcarbohydrate per serving:   1 slice of bread (1 oz) or 1 six-inch tortilla.    of a hamburger bun or English muffin.  4-6 crackers.   cup unsweetened dry cereal.    cup hot cereal.   cup rice or pasta.    cup mashed potatoes or  of a large baked potato.  1 cup fresh fruit or one small piece of fruit.    cup canned or frozen fruit or fruit juice.  1 cup milk.   cup plain fat-free yogurt or yogurt sweetened with artificial sweeteners.   cup cooked dried beans or starchy vegetable, such as peas, corn, or potatoes.  Decide the number of standard-size servings that you will eat. Multiply that   number of servings by 15 (the grams of carbohydrates in that serving). For example, if you eat 2 cups of strawberries, you will have eaten 2 servings and 30 g of carbohydrates (2 servings x 15 g = 30 g). For foods such as soups and casseroles, in which more than one food is mixed in, you will need to count the carbohydrates in each food that is included. EXAMPLE OF CARBOHYDRATE COUNTING Sample Dinner  3 oz chicken breast.   cup of brown rice.   cup of corn.  1 cup milk.   1 cup strawberries with sugar-free whipped topping.  Carbohydrate Calculation Step 1: Identify the foods that contain carbohydrates:   Rice.   Corn.   Milk.   Strawberries. Step 2:Calculate the number of servings eaten of each:   2 servings of rice.   1 serving of corn.   1 serving of milk.   1 serving of strawberries. Step 3: Multiply each of those number of servings by 15 g:   2 servings of rice x 15 g = 30 g.   1 serving of corn x 15 g = 15 g.   1 serving of milk x 15 g = 15 g.   1 serving of strawberries x 15 g = 15 g. Step 4: Add together all of the amounts to find the total grams of carbohydrates eaten: 30 g + 15 g + 15 g + 15 g = 75 g.   This information is not intended to replace advice given to you by your health care provider. Make sure you  discuss any questions you have with your health care provider.   Document Released: 12/17/2005 Document Revised: 01/07/2015 Document Reviewed: 11/13/2013 Elsevier Interactive Patient Education 2016 Elsevier Inc. Gastroparesis Gastroparesis, also called delayed gastric emptying, is a condition in which food takes longer than normal to empty from the stomach. The condition is usually long-lasting (chronic). CAUSES This condition may be caused by:  An endocrine disorder, such as hypothyroidism or diabetes. Diabetes is the most common cause of this condition.  A nervous system disease, such as Parkinson disease or multiple sclerosis.  Cancer, infection, or surgery of the stomach or vagus nerve.  A connective tissue disorder, such as scleroderma.  Certain medicines. In most cases, the cause is not known. RISK FACTORS This condition is more likely to develop in:  People with certain disorders, including endocrine disorders, eating disorders, amyloidosis, and scleroderma.  People with certain diseases, including Parkinson disease or multiple sclerosis.  People with cancer or infection of the stomach or vagus nerve.  People who have had surgery on the stomach or vagus nerve.  People who take certain medicines.  Women. SYMPTOMS Symptoms of this condition include:  An early feeling of fullness when eating.  Nausea.  Weight loss.  Vomiting.  Heartburn.  Abdominal bloating.  Inconsistent blood glucose levels.  Lack of appetite.  Acid from the stomach coming up into the esophagus (gastroesophageal reflux).  Spasms of the stomach. Symptoms may come and go. DIAGNOSIS This condition is diagnosed with tests, such as:  Tests that check how long it takes food to move through the stomach and intestines. These tests include:  Upper gastrointestinal (GI) series. In this test, X-rays of the intestines are taken after you drink a liquid. The liquid makes the intestines show up  better on the X-rays.  Gastric emptying scintigraphy. In this test, scans are taken after you eat food that contains a small amount of radioactive material.  Wireless capsule GI monitoring system. This test involves swallowing a capsule that records information about movement through the stomach.  Gastric manometry. This test measures electrical and muscular activity in the stomach. It is done with a thin tube that is passed down the throat and into the stomach.  Endoscopy. This test checks for abnormalities in the lining of the stomach. It is done with a long, thin tube that is passed down the throat and into the stomach.  An ultrasound. This test can help rule out gallbladder disease or pancreatitis as a cause of your symptoms. It uses sound waves to take pictures of the inside of your body. TREATMENT There is no cure for gastroparesis. This condition may be managed with:  Treatment of the underlying condition causing the gastroparesis.  Lifestyle changes, including exercise and dietary changes. Dietary changes can include:  Changes in what and when you eat.  Eating smaller meals more often.  Eating low-fat foods.  Eating low-fiber forms of high-fiber foods, such as cooked vegetables instead of raw vegetables.  Having liquid foods in place of solid foods. Liquid foods are easier to digest.  Medicines. These may be given to control nausea and vomiting and to stimulate stomach muscles.  Getting food through a feeding tube. This may be done in severe cases.  A gastric neurostimulator. This is a device that is inserted into the body with surgery. It helps improve stomach emptying and control nausea and vomiting. HOME CARE INSTRUCTIONS  Follow your health care provider's instructions about exercise and diet.  Take medicines only as directed by your health care provider. SEEK MEDICAL CARE IF:  Your symptoms do not improve with treatment.  You have new symptoms. SEEK IMMEDIATE  MEDICAL CARE IF:  You have severe abdominal pain that does not improve with treatment.  You have nausea that does not go away.  You cannot keep fluids down.   This information is not intended to replace advice given to you by your health care provider. Make sure you discuss any questions you have with your health care provider.   Document Released: 12/17/2005 Document Revised: 05/03/2015 Document Reviewed: 12/13/2014 Elsevier Interactive Patient Education [employer]2016 Elsevier Inc.

## 2016-03-22 NOTE — Progress Notes (Signed)
Patient ID: Dwayne [name], male   DOB: [birth year], 36 y.o.   MRN: [id number]   Dwayne [name], is a 36 y.o. male  GDJ:[id number]  QQI:[id number]  DOB - December 30, 1980  Chief Complaint  Patient presents with  . Abdominal Pain        Subjective:   Dwayne [name] is a 36 y.o. male with medical history significant for type 1 diabetes mellitus with multiple complications including diabetic nephropathy, diabetic retinopathy, diabetic foot and diabetic gastroparesis here today for a follow up visit and medication management. Patient is complaining of bloating and epigastric pain similar to his usual gastroparesis symptoms. Reglan provided some form of relief when prescribed previously but he does not have any pills left. He follows up regularly with podiatrist for his diabetic foot, nephrologist for his diabetic nephropathy and ophthalmologist for his diabetic retinopathy. He has appointments coming up. He also follows up with cardiologist for his endocarditis status post mitral valve repair. Patient is doing much better with his diabetic control, previous hemoglobin A1c was 9.5%. Blood pressure is also better controlled. Patient has No headache, No chest pain, No abdominal pain - No Nausea, No new weakness tingling or numbness.  Problem  Diabetic Gastroparesis Associated With Type 1 Diabetes Mellitus (Hcc)  Type 1 Diabetes Mellitus With Diabetic Polyneuropathy (Hcc)  Adjustment Disorder With Depressed Mood    ALLERGIES: No Known Allergies  PAST MEDICAL HISTORY: Past Medical History  Diagnosis Date  . Hypertension   . Acute renal failure ([place])   . Bacteremia     MSSA   . Diabetes type 2, uncontrolled ([place]) 01/09/2015  . 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 Essential hypertension, benign 01/18/2014  . Infective myositis of left thigh   . Noncompliance 01/18/2014  . Septic shock ([place])   . Staphylococcus aureus bacteremia with sepsis ([place])   . Tobacco abuse 01/15/2014  . Mitral  regurgitation 01/13/2015    Severe by TEE  . MSSA (methicillin susceptible Staphylococcus aureus)   . Type II diabetes mellitus (HCC)     not controlled  . Diabetic foot infection ([place]) 01/15/2014  . 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 Authorizing Provider  acetaminophen-codeine (TYLENOL #3) 300-30 MG tablet Take 1 tablet by mouth every 4 (four) hours as needed. 12/05/15   [name], MD  amLODipine (NORVASC) 10 MG tablet Take 1 tablet (10 mg total) by mouth daily. 12/05/15   [name], MD  Blood Pressure Monitoring (ADULT BLOOD PRESSURE CUFF LG) KIT Measure BP 2x daily 01/16/16   [name], MD  carvedilol (COREG) 6.25 MG tablet Take 1 tablet (6.25 mg total) by mouth 2 (two) times daily with a meal. 01/16/16   [name], MD  cloNIDine (CATAPRES) 0.3 MG tablet Take 1 tablet (0.3 mg total) by mouth 3 (three) times daily. Patient taking differently: Take 0.3 mg by mouth 2 (two) times daily.  12/05/15   [name], MD  cyclobenzaprine (FLEXERIL) 10 MG tablet Take 1 tablet (10 mg total) by mouth 2 (two) times daily as needed for muscle spasms. 09/15/15   [name], MD  furosemide (LASIX) 40 MG tablet Take 1-2 tablets (40-80 mg total) by mouth 2 (two) times daily. Takes  85m in am and 888min pm 12/05/15   OlTresa GarterMD  glucose blood (ACCU-CHEK AVIVA PLUS) test strip Use as instructed 12/19/15   OlTresa GarterMD  hydrALAZINE (APRESOLINE) 50 MG tablet Take 1 tablet (50 mg total) by mouth 3 (three) times daily. 12/05/15   OlTresa GarterMD  insulin aspart (NOVOLOG FLEXPEN) 100 UNIT/ML FlexPen Inject 6 Units into the skin 3 (three) times  daily with meals. 03/16/16   OlTresa GarterMD  Insulin Glargine (LANTUS SOLOSTAR) 100 UNIT/ML Solostar Pen Inject 20 Units into the skin daily at 10 pm. 03/22/16   OlTresa GarterMD  Insulin Pen Needle (B-D UF III MINI PEN NEEDLES) 31G X 5 MM MISC Use one needle with each injection of Lantus Pen daily. 12/10/15   OlTresa GarterMD  Insulin Syringe-Needle U-100 28G X 1/2" 0.5 ML MISC Use as directed. Any generic available. 12/05/15   OlTresa GarterMD  Lancets (ACCU-CHEK SOFT TOUCH) lancets Use as instructed 12/10/15   OlTresa GarterMD  metoCLOPramide (REGLAN) 10 MG tablet Take 1 tablet (10 mg total) by mouth 4 (four) times daily -  before meals and at bedtime. 03/22/16   OlTresa GarterMD  ondansetron (ZOFRAN) 4 MG tablet Take 1 tablet (4 mg total) by mouth every 8 (eight) hours as needed for nausea or vomiting. 03/22/16   OlTresa GarterMD  pantoprazole (PROTONIX) 40 MG tablet Take 1 tablet (40 mg total) by mouth daily. 03/22/16   OlTresa GarterMD  PARoxetine (PAXIL) 20 MG tablet Take 1 tablet (20 mg total) by mouth daily. 03/22/16   OlTresa GarterMD  potassium chloride SA (K-DUR,KLOR-CON) 20 MEQ tablet Take 2 tablets daily 09/15/15   OlTresa GarterMD  silver sulfADIAZINE (SILVADENE) 1 % cream Apply 1 application topically 2 (two) times daily. 01/05/16   GrGardiner BarefootDPM  UNABLE TO FIND Reported on 01/16/2016 12/19/15   Historical Provider, MD     Objective:   Filed Vitals:   03/22/16 1718  BP: 136/83  Pulse: 73  Temp: 98.2 F (36.8 C)  TempSrc: Oral  Resp: 18  Height: _0  (1.88 m)  Weight: 234 lb (106.142 kg)  SpO2: 99%    Exam General appearance : Awake, alert, not in any distress. Speech Clear. Not toxic looking HEENT: Atraumatic and Normocephalic, pupils equally reactive to light and accomodation Neck: supple, no JVD. No cervical lymphadenopathy.  Chest:Good air entry bilaterally, no added sounds  CVS: S1 S2 regular, no  murmurs.  Abdomen: Bowel sounds present, Non tender and not distended with no gaurding, rigidity or rebound. Extremities: B/L Lower Ext shows no edema, both legs are warm to touch Neurology: Awake alert, and oriented X 3, CN II-XII intact, Non focal Skin: No Rash  Data Review Lab Results  Component Value Date   HGBA1C 9.50 01/16/2016   HGBA1C 9.90 10/17/2015   HGBA1C 12.80 08/18/2015     Assessment & Plan   1. Type 1 diabetes mellitus with diabetic polyneuropathy (HCC)  - Insulin Glargine (LANTUS SOLOSTAR) 100 UNIT/ML Solostar Pen; Inject 20 Units into the skin daily at 10 pm.  Dispense: 3 pen; Refill: 3  2. Essential hypertension  We have discussed target BP range and blood pressure goal. I have advised patient to check BP regularly and to call us[place]orea[place] or report to clinic if the numbers are consistently higher than 140/90. We discussed the importance of compliance with medical  therapy and DASH diet recommended, consequences of uncontrolled hypertension discussed.   - continue current BP medications  3. Diabetic nephropathy associated with type 1 diabetes mellitus ([place])  - Follow up with Nephrologist  Aim for 30 minutes of exercise most days. Rethink what you drink. Water is great! Aim for 2-3 Carb Choices per meal (30-45 grams) +/- 1 either way  Aim for 0-15 Carbs per snack if hungry  Include protein in moderation with your meals and snacks  Consider reading food labels for Total Carbohydrate and Fat Grams of foods  Consider checking BG at alternate times per day  Continue taking medication as directed Be mindful about how much sugar you are adding to beverages and other foods. Fruit Punch - find one with no sugar  Measure and decrease portions of carbohydrate foods  Make your plate and don't go back for seconds   4. Diabetic gastroparesis associated with type 1 diabetes mellitus (HCC)  - metoCLOPramide (REGLAN) 10 MG tablet; Take 1 tablet (10 mg total) by mouth 4  (four) times daily -  before meals and at bedtime.  Dispense: 60 tablet; Refill: 3 - ondansetron (ZOFRAN) 4 MG tablet; Take 1 tablet (4 mg total) by mouth every 8 (eight) hours as needed for nausea or vomiting.  Dispense: 60 tablet; Refill: 2 - pantoprazole (PROTONIX) 40 MG tablet; Take 1 tablet (40 mg total) by mouth daily.  Dispense: 30 tablet; Refill: 3  - Ambulatory referral to Gastroenterology  5. Adjustment disorder with depressed mood  - PARoxetine (PAXIL) 20 MG tablet; Take 1 tablet (20 mg total) by mouth daily.  Dispense: 30 tablet; Refill: 3 - Ambulatory referral to Psychiatry  Patient have been counseled extensively about nutrition and exercise  Return in about 4 weeks (around 04/19/2016) for Gastroparesis, T1DM.  The patient was given clear instructions to go to ER or return to medical center if symptoms don't improve, worsen or new problems develop. The patient verbalized understanding. The patient was told to call to get lab results if they haven't heard anything in the next week.   This note has been created with Surveyor, quantity. Any transcriptional errors are unintentional.    Angelica Chessman, MD, [place], [place], [place], [place] and [place], [place]   03/22/2016, 5:21 PM

## 2016-03-29 ENCOUNTER — Ambulatory Visit: Payer: Medicaid Other | Admitting: Podiatry

## 2016-03-30 ENCOUNTER — Other Ambulatory Visit: Payer: Self-pay | Admitting: Internal Medicine

## 2016-04-04 ENCOUNTER — Telehealth: Payer: Self-pay | Admitting: *Deleted

## 2016-04-04 NOTE — Telephone Encounter (Signed)
Patient verified DOB Patient is aware of medication being placed at the front desk for pickup. Patient had no further questions at this time.

## 2016-05-04 ENCOUNTER — Other Ambulatory Visit: Payer: Self-pay | Admitting: Internal Medicine

## 2016-05-06 ENCOUNTER — Other Ambulatory Visit: Payer: Self-pay | Admitting: Internal Medicine

## 2016-05-10 ENCOUNTER — Other Ambulatory Visit: Payer: Self-pay | Admitting: Internal Medicine

## 2016-05-10 ENCOUNTER — Encounter: Payer: Self-pay | Admitting: Internal Medicine

## 2016-05-10 ENCOUNTER — Ambulatory Visit: Payer: Medicaid Other | Attending: Internal Medicine | Admitting: Internal Medicine

## 2016-05-10 VITALS — BP 111/75 | HR 69 | Temp 97.6°F | Resp 18 | Ht 74.0 in | Wt 239.0 lb

## 2016-05-10 DIAGNOSIS — F4321 Adjustment disorder with depressed mood: Secondary | ICD-10-CM | POA: Insufficient documentation

## 2016-05-10 DIAGNOSIS — E1043 Type 1 diabetes mellitus with diabetic autonomic (poly)neuropathy: Secondary | ICD-10-CM | POA: Diagnosis not present

## 2016-05-10 DIAGNOSIS — Z794 Long term (current) use of insulin: Secondary | ICD-10-CM | POA: Insufficient documentation

## 2016-05-10 DIAGNOSIS — E1042 Type 1 diabetes mellitus with diabetic polyneuropathy: Secondary | ICD-10-CM | POA: Diagnosis not present

## 2016-05-10 DIAGNOSIS — Z79899 Other long term (current) drug therapy: Secondary | ICD-10-CM | POA: Insufficient documentation

## 2016-05-10 DIAGNOSIS — I10 Essential (primary) hypertension: Secondary | ICD-10-CM

## 2016-05-10 DIAGNOSIS — K3184 Gastroparesis: Secondary | ICD-10-CM | POA: Diagnosis not present

## 2016-05-10 DIAGNOSIS — Z9889 Other specified postprocedural states: Secondary | ICD-10-CM | POA: Insufficient documentation

## 2016-05-10 LAB — COMPLETE METABOLIC PANEL WITH GFR
ALBUMIN: 3.6 g/dL (ref 3.6–5.1)
ALK PHOS: 65 U/L (ref 40–115)
ALT: 25 U/L (ref 9–46)
AST: 14 U/L (ref 10–40)
BUN: 42 mg/dL — AB (ref 7–25)
CALCIUM: 8.7 mg/dL (ref 8.6–10.3)
CHLORIDE: 109 mmol/L (ref 98–110)
CO2: 21 mmol/L (ref 20–31)
Creat: 4.31 mg/dL — ABNORMAL HIGH (ref 0.60–1.35)
GFR, Est African American: 19 mL/min — ABNORMAL LOW (ref >=60)
GFR, Est Non African American: 16 mL/min — ABNORMAL LOW (ref >=60)
Glucose, Bld: 118 mg/dL — ABNORMAL HIGH (ref 65–99)
POTASSIUM: 5.7 mmol/L — AB (ref 3.5–5.3)
Sodium: 140 mmol/L (ref 135–146)
Total Bilirubin: 0.2 mg/dL (ref 0.2–1.2)
Total Protein: 6.7 g/dL (ref 6.1–8.1)

## 2016-05-10 MED ORDER — CLONIDINE HCL 0.3 MG PO TABS: 0.3000 mg | ORAL_TABLET | Freq: Three times a day (TID) | ORAL | Status: DC

## 2016-05-10 MED ORDER — ACETAMINOPHEN-CODEINE #4 300-60 MG PO TABS: 1.0000 | ORAL_TABLET | ORAL | Status: DC | PRN

## 2016-05-10 MED ORDER — ONDANSETRON HCL 4 MG PO TABS: 4.0000 mg | ORAL_TABLET | Freq: Three times a day (TID) | ORAL | Status: AC | PRN

## 2016-05-10 MED ORDER — FUROSEMIDE 40 MG PO TABS: 40.0000 mg | ORAL_TABLET | Freq: Two times a day (BID) | ORAL | Status: DC

## 2016-05-10 MED ORDER — METOCLOPRAMIDE HCL 10 MG PO TABS: 10.0000 mg | ORAL_TABLET | Freq: Three times a day (TID) | ORAL | Status: DC

## 2016-05-10 NOTE — Progress Notes (Signed)
Patient ID: Dwayne [name], [name]   DOB: 04/17/1980, 36 y.o.   MRN: [id number]   Dwayne [name], is a 36 y.o. [name]  [id number]  IHK:[id number]  DOB - [birth year]  Chief Complaint  Patient presents with  . Follow-up    DM        Subjective:   Dwayne [name] is a 36 y.o. [name] with complex medical history including type 1 DM with multiple complications including diabetic nephropathy, diabetic retinopathy, diabetic foot and diabetic gastroparesis here today for a follow up visit and HbA1C check. He continues to have abdominal pain, mostly at the epigastrium, easy satiety and vomiting especially posttussive. Patient had been referred to a gastroenterologist, he has appointment coming up to evaluate his gastroparesis. He claims Tylenol No. 3 is not giving him enough relief from his pain, but he gets some relief from reglan. He also has an appointment, now with ophthalmologist to reevaluate his left eye. Patient has No headache, No chest pain. Patient may be relocating to [facility], needs referral to a good primary care physician there to take over his medical care.  No problems updated.  ALLERGIES: No Known Allergies  PAST MEDICAL HISTORY: Past Medical History  Diagnosis Date  . Hypertension   . Acute renal failure ([place])   . Bacteremia     MSSA   . Diabetes type 2, uncontrolled ([place]) 01/09/2015  . 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 Essential hypertension, benign 01/18/2014  . Infective myositis of left thigh   . Noncompliance 01/18/2014  . Septic shock ([place])   . Staphylococcus aureus bacteremia with sepsis ([place])   . Tobacco abuse 01/15/2014  . Mitral regurgitation 01/13/2015    Severe by TEE  . MSSA (methicillin susceptible Staphylococcus aureus)   . Type II diabetes mellitus (HCC)     not controlled  . Diabetic foot infection ([place]) 01/15/2014  . S/P minimally-invasive mitral valve repair 02/18/2015    Complex valvuloplasty including bovine pericardial patch repair  of perforation of posterior leaflet with 28 mm Sorin Memo 3D ring annuloplasty via right mini thoracotomy approach     MEDICATIONS AT HOME: Prior to Admission medications   Medication Sig Start Date End Date Taking? Authorizing Provider  ACCU-CHEK AVIVA PLUS test strip USE TO TEST BLOOD SUGAR THREE TIMES A DAY 05/07/16   [name], MD  ACCU-CHEK SOFTCLIX LANCETS lancets USE TO TEST BLOOD SUGAR THREE TIMES A DAY 05/07/16   [name], MD  acetaminophen-codeine (TYLENOL #4) 300-60 MG tablet Take 1 tablet by mouth every 4 (four) hours as needed for moderate pain. 05/10/16   [name], MD  amLODipine (NORVASC) 10 MG tablet TAKE 1 TABLET BY MOUTH ONCE DAILY 05/07/16   [name], MD  Blood Pressure Monitoring (ADULT BLOOD PRESSURE CUFF LG) KIT Measure BP 2x daily 01/16/16   [name], MD  carvedilol (COREG) 6.25 MG tablet Take 1 tablet (6.25 mg total) by mouth 2 (two) times daily. 05/07/16   [name], MD  cloNIDine (CATAPRES) 0.3 MG tablet Take 1 tablet (0.3 mg total) by mouth 3 (three) times daily. 05/10/16   [name], MD  cyclobenzaprine (FLEXERIL) 10 MG tablet Take 1 tablet (10 mg total) by mouth 2 (two) times daily as needed for muscle spasms. 09/15/15   [name], MD  EASY TOUCH PEN NEEDLES 31G X 8 MM MISC USE TO INJECT INSULIN (LANTUS) AS DIRECTED 05/07/16   [name], MD  furosemide (LASIX) 40 MG tablet  Take 1 tablet (40 mg total) by mouth 2 (two) times daily. 05/10/16   [name], MD  hydrALAZINE (APRESOLINE) 50 MG tablet Take 1 tablet (50 mg total) by mouth 3 (three) times daily. 12/05/15   [name], MD  insulin aspart (NOVOLOG FLEXPEN) 100 UNIT/ML FlexPen Inject 6 Units into the skin 3 (three) times daily with meals. 03/16/16   [name], MD  Insulin Glargine (LANTUS SOLOSTAR) 100 UNIT/ML Solostar Pen Inject 20 Units into the skin daily at 10 pm. 05/07/16   [name], MD  Insulin  Pen Needle (B-D UF III MINI PEN NEEDLES) 31G X 5 MM MISC Use one needle with each injection of Lantus Pen daily. 12/10/15   [name], MD  Insulin Syringe-Needle U-100 28G X 1/2" 0.5 ML MISC Use as directed. Any generic available. 12/05/15   [name], MD  metoCLOPramide (REGLAN) 10 MG tablet Take 1 tablet (10 mg total) by mouth 4 (four) times daily -  before meals and at bedtime. 05/10/16   [name], MD  ondansetron (ZOFRAN) 4 MG tablet Take 1 tablet (4 mg total) by mouth every 8 (eight) hours as needed for nausea or vomiting. 05/10/16   [name], MD  pantoprazole (PROTONIX) 40 MG tablet Take 1 tablet (40 mg total) by mouth daily. 03/22/16   [name], MD  PARoxetine (PAXIL) 20 MG tablet Take 1 tablet (20 mg total) by mouth daily. 03/22/16   [name], MD  potassium chloride SA (K-DUR,KLOR-CON) 20 MEQ tablet Take 2 tablets daily 09/15/15   [name], MD  silver sulfADIAZINE (SILVADENE) 1 % cream Apply 1 application topically 2 (two) times daily. 01/05/16   Gardiner Barefoot, DPM  UNABLE TO FIND Reported on 01/16/2016 12/19/15   Historical Provider, MD     Objective:   Filed Vitals:   05/10/16 1239  BP: 111/75  Pulse: 69  Temp: 97.6 F (36.4 C)  TempSrc: Oral  Resp: 18  Height: 6' 2" (1.88 m)  Weight: 239 lb (108.41 kg)  SpO2: 100%    Exam General appearance : Awake, alert, not in any distress. Speech Clear. 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 No cervical lymphadenopathy.  Chest:Good air entry bilaterally, no added sounds  CVS: S1 S2 regular, no murmurs.  Abdomen: Bowel sounds present, Non tender and not distended with no gaurding, rigidity or rebound. Extremities: B/L Lower Ext shows no edema, both legs are warm to touch Neurology: Awake alert, and oriented X 3, CN II-XII intact, Non focal, slow reaction  time Skin: No Rash  Data Review Lab Results  Component Value Date   HGBA1C 9.50 01/16/2016   HGBA1C 9.90 10/17/2015   HGBA1C 12.80 08/18/2015     Assessment & Plan   1. Essential hypertension: At Goal  - COMPLETE METABOLIC PANEL WITH GFR - cloNIDine (CATAPRES) 0.3 MG tablet; Take 1 tablet (0.3 mg total) by mouth 3 (three) times daily.  Dispense: 270 tablet; Refill: 3 - furosemide (LASIX) 40 MG tablet; Take 1 tablet (40 mg total) by mouth 2 (two) times daily.  Dispense: 180 tablet; Refill: 3  We have discussed target BP range and blood pressure goal. I have advised patient to check BP regularly and to call us back or report to clinic if the numbers are consistently higher than 140/90. We discussed the importance of compliance with medical therapy  and DASH diet recommended, consequences of uncontrolled hypertension discussed.   - continue current BP medications  2. Type 1 diabetes mellitus with diabetic polyneuropathy (HCC)  - Hemoglobin A1c - Glucose (CBG)  3. Diabetic gastroparesis associated with type 1 diabetes mellitus (HCC)  Try - acetaminophen-codeine (TYLENOL #4) 300-60 MG tablet; Take 1 tablet by mouth every 4 (four) hours as needed for moderate pain.  Dispense: 60 tablet; Refill: 0 - metoCLOPramide (REGLAN) 10 MG tablet; Take 1 tablet (10 mg total) by mouth 4 (four) times daily -  before meals and at bedtime.  Dispense: 60 tablet; Refill: 3 - ondansetron (ZOFRAN) 4 MG tablet; Take 1 tablet (4 mg total) by mouth every 8 (eight) hours as needed for nausea or vomiting.  Dispense: 60 tablet; Refill: 3  - Follow up with Gastroenterologist, patient has appointment  4. Adjustment disorder with depressed mood  - Hold Paxil for now, see a psychiatrist - Ambulatory referral to Psychiatry  Patient have been counseled extensively about nutrition and exercise  Return in about 3 months (around 08/10/2016) for Hemoglobin A1C and Follow up, DM, Follow up HTN, Follow up Pain and  comorbidities.  The patient was given clear instructions to go to ER or return to medical center if symptoms don't improve, worsen or new problems develop. The patient verbalized understanding. The patient was told to call to get lab results if they haven't heard anything in the next week.   This note has been created with Surveyor, quantity. Any transcriptional errors are unintentional.    Angelica Chessman, MD, [place], [place], [place], [place] and [place], [place]   05/10/2016, 1:03 PM

## 2016-05-10 NOTE — Patient Instructions (Signed)
DASH Eating Plan DASH stands for "Dietary Approaches to Stop Hypertension." The DASH eating plan is a healthy eating plan that has been shown to reduce high blood pressure (hypertension). Additional health benefits may include reducing the risk of type 2 diabetes mellitus, heart disease, and stroke. The DASH eating plan may also help with weight loss. WHAT DO I NEED TO KNOW ABOUT THE DASH EATING PLAN? For the DASH eating plan, you will follow these general guidelines:  Choose foods with a percent daily value for sodium of less than 5% (as listed on the food label).  Use salt-free seasonings or herbs instead of table salt or sea salt.  Check with your health care provider or pharmacist before using salt substitutes.  Eat lower-sodium products, often labeled as "lower sodium" or "no salt added."  Eat fresh foods.  Eat more vegetables, fruits, and low-fat dairy products.  Choose whole grains. Look for the word "whole" as the first word in the ingredient list.  Choose fish and skinless chicken or turkey more often than red meat. Limit fish, poultry, and meat to 6 oz (170 g) each day.  Limit sweets, desserts, sugars, and sugary drinks.  Choose heart-healthy fats.  Limit cheese to 1 oz (28 g) per day.  Eat more home-cooked food and less restaurant, buffet, and fast food.  Limit fried foods.  Cook foods using methods other than frying.  Limit canned vegetables. If you do use them, rinse them well to decrease the sodium.  When eating at a restaurant, ask that your food be prepared with less salt, or no salt if possible. WHAT FOODS CAN I EAT? Seek help from a dietitian for individual calorie needs. Grains Whole grain or whole wheat bread. Brown rice. Whole grain or whole wheat pasta. Quinoa, bulgur, and whole grain cereals. Low-sodium cereals. Corn or whole wheat flour tortillas. Whole grain cornbread. Whole grain crackers. Low-sodium crackers. Vegetables Fresh or frozen vegetables  (raw, steamed, roasted, or grilled). Low-sodium or reduced-sodium tomato and vegetable juices. Low-sodium or reduced-sodium tomato sauce and paste. Low-sodium or reduced-sodium canned vegetables.  Fruits All fresh, canned (in natural juice), or frozen fruits. Meat and Other Protein Products Ground beef (85% or leaner), grass-fed beef, or beef trimmed of fat. Skinless chicken or turkey. Ground chicken or turkey. Pork trimmed of fat. All fish and seafood. Eggs. Dried beans, peas, or lentils. Unsalted nuts and seeds. Unsalted canned beans. Dairy Low-fat dairy products, such as skim or 1% milk, 2% or reduced-fat cheeses, low-fat ricotta or cottage cheese, or plain low-fat yogurt. Low-sodium or reduced-sodium cheeses. Fats and Oils Tub margarines without trans fats. Light or reduced-fat mayonnaise and salad dressings (reduced sodium). Avocado. Safflower, olive, or canola oils. Natural peanut or almond butter. Other Unsalted popcorn and pretzels. The items listed above may not be a complete list of recommended foods or beverages. Contact your dietitian for more options. WHAT FOODS ARE NOT RECOMMENDED? Grains White bread. White pasta. White rice. Refined cornbread. Bagels and croissants. Crackers that contain trans fat. Vegetables Creamed or fried vegetables. Vegetables in a cheese sauce. Regular canned vegetables. Regular canned tomato sauce and paste. Regular tomato and vegetable juices. Fruits Dried fruits. Canned fruit in light or heavy syrup. Fruit juice. Meat and Other Protein Products Fatty cuts of meat. Ribs, chicken wings, bacon, sausage, bologna, salami, chitterlings, fatback, hot dogs, bratwurst, and packaged luncheon meats. Salted nuts and seeds. Canned beans with salt. Dairy Whole or 2% milk, cream, half-and-half, and cream cheese. Whole-fat or sweetened yogurt. Full-fat   cheeses or blue cheese. Nondairy creamers and whipped toppings. Processed cheese, cheese spreads, or cheese  curds. Condiments Onion and garlic salt, seasoned salt, table salt, and sea salt. Canned and packaged gravies. Worcestershire sauce. Tartar sauce. Barbecue sauce. Teriyaki sauce. Soy sauce, including reduced sodium. Steak sauce. Fish sauce. Oyster sauce. Cocktail sauce. Horseradish. Ketchup and mustard. Meat flavorings and tenderizers. Bouillon cubes. Hot sauce. Tabasco sauce. Marinades. Taco seasonings. Relishes. Fats and Oils Butter, stick margarine, lard, shortening, ghee, and bacon fat. Coconut, palm kernel, or palm oils. Regular salad dressings. Other Pickles and olives. Salted popcorn and pretzels. The items listed above may not be a complete list of foods and beverages to avoid. Contact your dietitian for more information. WHERE CAN I FIND MORE INFORMATION? National Heart, Lung, and [employer] Institute: www.nhlbi.nih.gov/health/health-topics/topics/dash/   This information is not intended to replace advice given to you by your health care provider. Make sure you discuss any questions you have with your health care provider.   Document Released: 12/06/2011 Document Revised: 01/07/2015 Document Reviewed: 10/21/2013 Elsevier Interactive Patient Education 2016 Elsevier Inc[employer] Hypertension Hypertension, commonly called high blood pressure, is when the force of blood pumping through your arteries is too strong. Your arteries are the blood vessels that carry blood from your heart throughout your body. A blood pressure reading consists of a higher number over a lower number, such as 110/72. The higher number (systolic) is the pressure inside your arteries when your heart pumps. The lower number (diastolic) is the pressure inside your arteries when your heart relaxes. Ideally you want your blood pressure below 120/80. Hypertension forces your heart to work harder to pump blood. Your arteries may become narrow or stiff. Having untreated or uncontrolled hypertension can cause heart attack, stroke, kidney  disease, and other problems. RISK FACTORS Some risk factors for high blood pressure are controllable. Others are not.  Risk factors you cannot control include:   Race. You may be at higher risk if you are African American.  Age. Risk increases with age.  Gender. Men are at higher risk than women before age 45 years. After age 65, women are at higher risk than men. Risk factors you can control include:  Not getting enough exercise or physical activity.  Being overweight.  Getting too much fat, sugar, calories, or salt in your diet.  Drinking too much alcohol. SIGNS AND SYMPTOMS Hypertension does not usually cause signs or symptoms. Extremely high blood pressure (hypertensive crisis) may cause headache, anxiety, shortness of breath, and nosebleed. DIAGNOSIS To check if you have hypertension, your health care provider will measure your blood pressure while you are seated, with your arm held at the level of your heart. It should be measured at least twice using the same arm. Certain conditions can cause a difference in blood pressure between your right and left arms. A blood pressure reading that is higher than normal on one occasion does not mean that you need treatment. If it is not clear whether you have high blood pressure, you may be asked to return on a different day to have your blood pressure checked again. Or, you may be asked to monitor your blood pressure at home for 1 or more weeks. TREATMENT Treating high blood pressure includes making lifestyle changes and possibly taking medicine. Living a healthy lifestyle can help lower high blood pressure. You may need to change some of your habits. Lifestyle changes may include:  Following the DASH diet. This diet is high in fruits, vegetables, and whole grains.   It is low in salt, red meat, and added sugars.  Keep your sodium intake below 2,300 mg per day.  Getting at least 30-45 minutes of aerobic exercise at least 4 times per  week.  Losing weight if necessary.  Not smoking.  Limiting alcoholic beverages.  Learning ways to reduce stress. Your health care provider may prescribe medicine if lifestyle changes are not enough to get your blood pressure under control, and if one of the following is true:  You are 18-59 years of age and your systolic blood pressure is above 140.  You are 60 years of age or older, and your systolic blood pressure is above 150.  Your diastolic blood pressure is above 90.  You have diabetes, and your systolic blood pressure is over 140 or your diastolic blood pressure is over 90.  You have kidney disease and your blood pressure is above 140/90.  You have heart disease and your blood pressure is above 140/90. Your personal target blood pressure may vary depending on your medical conditions, your age, and other factors. HOME CARE INSTRUCTIONS  Have your blood pressure rechecked as directed by your health care provider.   Take medicines only as directed by your health care provider. Follow the directions carefully. Blood pressure medicines must be taken as prescribed. The medicine does not work as well when you skip doses. Skipping doses also puts you at risk for problems.  Do not smoke.   Monitor your blood pressure at home as directed by your health care provider. SEEK MEDICAL CARE IF:   You think you are having a reaction to medicines taken.  You have recurrent headaches or feel dizzy.  You have swelling in your ankles.  You have trouble with your vision. SEEK IMMEDIATE MEDICAL CARE IF:  You develop a severe headache or confusion.  You have unusual weakness, numbness, or feel faint.  You have severe chest or abdominal pain.  You vomit repeatedly.  You have trouble breathing. MAKE SURE YOU:   Understand these instructions.  Will watch your condition.  Will get help right away if you are not doing well or get worse.   This information is not intended to  replace advice given to you by your health care provider. Make sure you discuss any questions you have with your health care provider.   Document Released: 12/17/2005 Document Revised: 05/03/2015 Document Reviewed: 10/09/2013 Elsevier Interactive Patient Education 2016 Elsevier Inc. Diabetes and Exercise Exercising regularly is important. It is not just about losing weight. It has many health benefits, such as:  Improving your overall fitness, flexibility, and endurance.  Increasing your bone density.  Helping with weight control.  Decreasing your body fat.  Increasing your muscle strength.  Reducing stress and tension.  Improving your overall health. People with diabetes who exercise gain additional benefits because exercise:  Reduces appetite.  Improves the body's use of blood sugar (glucose).  Helps lower or control blood glucose.  Decreases blood pressure.  Helps control blood lipids (such as cholesterol and triglycerides).  Improves the body's use of the hormone insulin by:  Increasing the body's insulin sensitivity.  Reducing the body's insulin needs.  Decreases the risk for heart disease because exercising:  Lowers cholesterol and triglycerides levels.  Increases the levels of good cholesterol (such as high-density lipoproteins [HDL]) in the body.  Lowers blood glucose levels. YOUR ACTIVITY PLAN  Choose an activity that you enjoy, and set realistic goals. To exercise safely, you should begin practicing any new physical   activity slowly, and gradually increase the intensity of the exercise over time. Your health care provider or diabetes educator can help create an activity plan that works for you. General recommendations include:  Encouraging children to engage in at least 60 minutes of physical activity each day.  Stretching and performing strength training exercises, such as yoga or weight lifting, at least 2 times per week.  Performing a total of at least  150 minutes of moderate-intensity exercise each week, such as brisk walking or water aerobics.  Exercising at least 3 days per week, making sure you allow no more than 2 consecutive days to pass without exercising.  Avoiding long periods of inactivity (90 minutes or more). When you have to spend an extended period of time sitting down, take frequent breaks to walk or stretch. RECOMMENDATIONS FOR EXERCISING WITH TYPE 1 OR TYPE 2 DIABETES   Check your blood glucose before exercising. If blood glucose levels are greater than 240 mg/dL, check for urine ketones. Do not exercise if ketones are present.  Avoid injecting insulin into areas of the body that are going to be exercised. For example, avoid injecting insulin into:  The arms when playing tennis.  The legs when jogging.  Keep a record of:  Food intake before and after you exercise.  Expected peak times of insulin action.  Blood glucose levels before and after you exercise.  The type and amount of exercise you have done.  Review your records with your health care provider. Your health care provider will help you to develop guidelines for adjusting food intake and insulin amounts before and after exercising.  If you take insulin or oral hypoglycemic agents, watch for signs and symptoms of hypoglycemia. They include:  Dizziness.  Shaking.  Sweating.  Chills.  Confusion.  Drink plenty of water while you exercise to prevent dehydration or heat stroke. Body water is lost during exercise and must be replaced.  Talk to your health care provider before starting an exercise program to make sure it is safe for you. Remember, almost any type of activity is better than none.   This information is not intended to replace advice given to you by your health care provider. Make sure you discuss any questions you have with your health care provider.   Document Released: 03/08/2004 Document Revised: 05/03/2015 Document Reviewed:  05/26/2013 Elsevier Interactive Patient Education 2016 Elsevier Inc. Basic Carbohydrate Counting for Diabetes Mellitus Carbohydrate counting is a method for keeping track of the amount of carbohydrates you eat. Eating carbohydrates naturally increases the level of sugar (glucose) in your blood, so it is important for you to know the amount that is okay for you to have in every meal. Carbohydrate counting helps keep the level of glucose in your blood within normal limits. The amount of carbohydrates allowed is different for every person. A dietitian can help you calculate the amount that is right for you. Once you know the amount of carbohydrates you can have, you can count the carbohydrates in the foods you want to eat. Carbohydrates are found in the following foods:  Grains, such as breads and cereals.  Dried beans and soy products.  Starchy vegetables, such as potatoes, peas, and corn.  Fruit and fruit juices.  Milk and yogurt.  Sweets and snack foods, such as cake, cookies, candy, chips, soft drinks, and fruit drinks. CARBOHYDRATE COUNTING There are two ways to count the carbohydrates in your food. You can use either of the methods or a combination of   both. Reading the "Nutrition Facts" on Packaged Food The "Nutrition Facts" is an area that is included on the labels of almost all packaged food and beverages in the [place]. It includes the serving size of that food or beverage and information about the nutrients in each serving of the food, including the grams (g) of carbohydrate per serving.  Decide the number of servings of this food or beverage that you will be able to eat or drink. Multiply that number of servings by the number of grams of carbohydrate that is listed on the label for that serving. The total will be the amount of carbohydrates you will be having when you eat or drink this food or beverage. Learning Standard Serving Sizes of Food When you eat food that is not  packaged or does not include "Nutrition Facts" on the label, you need to measure the servings in order to count the amount of carbohydrates.A serving of most carbohydrate-rich foods contains about 15 g of carbohydrates. The following list includes serving sizes of carbohydrate-rich foods that provide 15 g ofcarbohydrate per serving:   1 slice of bread (1 oz) or 1 six-inch tortilla.    of a hamburger bun or English muffin.  4-6 crackers.   cup unsweetened dry cereal.    cup hot cereal.   cup rice or pasta.    cup mashed potatoes or  of a large baked potato.  1 cup fresh fruit or one small piece of fruit.    cup canned or frozen fruit or fruit juice.  1 cup milk.   cup plain fat-free yogurt or yogurt sweetened with artificial sweeteners.   cup cooked dried beans or starchy vegetable, such as peas, corn, or potatoes.  Decide the number of standard-size servings that you will eat. Multiply that number of servings by 15 (the grams of carbohydrates in that serving). For example, if you eat 2 cups of strawberries, you will have eaten 2 servings and 30 g of carbohydrates (2 servings x 15 g = 30 g). For foods such as soups and casseroles, in which more than one food is mixed in, you will need to count the carbohydrates in each food that is included. EXAMPLE OF CARBOHYDRATE COUNTING Sample Dinner  3 oz chicken breast.   cup of brown rice.   cup of corn.  1 cup milk.   1 cup strawberries with sugar-free whipped topping.  Carbohydrate Calculation Step 1: Identify the foods that contain carbohydrates:   Rice.   Corn.   Milk.   Strawberries. Step 2:Calculate the number of servings eaten of each:   2 servings of rice.   1 serving of corn.   1 serving of milk.   1 serving of strawberries. Step 3: Multiply each of those number of servings by 15 g:   2 servings of rice x 15 g = 30 g.   1 serving of corn x 15 g = 15 g.   1 serving of milk x 15  g = 15 g.   1 serving of strawberries x 15 g = 15 g. Step 4: Add together all of the amounts to find the total grams of carbohydrates eaten: 30 g + 15 g + 15 g + 15 g = 75 g.   This information is not intended to replace advice given to you by your health care provider. Make sure you discuss any questions you have with your health care provider.   Document Released: 12/17/2005 Document Revised: 01/07/2015 Document   Reviewed: 11/13/2013 Elsevier Interactive Patient Education 2016 Elsevier Inc.  

## 2016-05-10 NOTE — Progress Notes (Signed)
Patient is here for FU DM  Patient complains of abdominal pain being scaled currently at a 2.  Patient has taken medication today and patient has eaten today.

## 2016-05-11 LAB — HEMOGLOBIN A1C
HEMOGLOBIN A1C: 7.5 % — AB (ref <5.7)
Mean Plasma Glucose: 169 mg/dL

## 2016-05-21 ENCOUNTER — Other Ambulatory Visit: Payer: Self-pay | Admitting: Internal Medicine

## 2016-05-21 DIAGNOSIS — K3184 Gastroparesis: Principal | ICD-10-CM

## 2016-05-21 DIAGNOSIS — E1043 Type 1 diabetes mellitus with diabetic autonomic (poly)neuropathy: Secondary | ICD-10-CM

## 2016-05-21 NOTE — Telephone Encounter (Signed)
Patient's family member called requesting a handicap sticker, patient states his current one will expire next week, patient would like to know if he could be able to get a permeant card instead of temp. Please f/up

## 2016-05-24 ENCOUNTER — Ambulatory Visit (INDEPENDENT_AMBULATORY_CARE_PROVIDER_SITE_OTHER): Payer: Medicaid Other | Admitting: Podiatry

## 2016-05-24 ENCOUNTER — Encounter: Payer: Self-pay | Admitting: Podiatry

## 2016-05-24 VITALS — BP 141/89 | HR 75 | Resp 14

## 2016-05-24 DIAGNOSIS — L97529 Non-pressure chronic ulcer of other part of left foot with unspecified severity: Secondary | ICD-10-CM | POA: Diagnosis not present

## 2016-05-24 DIAGNOSIS — E08621 Diabetes mellitus due to underlying condition with foot ulcer: Secondary | ICD-10-CM

## 2016-05-24 DIAGNOSIS — L97509 Non-pressure chronic ulcer of other part of unspecified foot with unspecified severity: Secondary | ICD-10-CM | POA: Diagnosis not present

## 2016-05-24 DIAGNOSIS — L97519 Non-pressure chronic ulcer of other part of right foot with unspecified severity: Secondary | ICD-10-CM | POA: Diagnosis not present

## 2016-05-24 DIAGNOSIS — Z794 Long term (current) use of insulin: Secondary | ICD-10-CM | POA: Diagnosis not present

## 2016-05-24 DIAGNOSIS — E10621 Type 1 diabetes mellitus with foot ulcer: Secondary | ICD-10-CM

## 2016-05-24 MED ORDER — METOCLOPRAMIDE HCL 10 MG PO TABS: 10.0000 mg | ORAL_TABLET | Freq: Three times a day (TID) | ORAL | Status: DC

## 2016-05-24 NOTE — Progress Notes (Signed)
Subjective:     Patient ID: Dwayne HickmanBenjamin [name], male   DOB: 04/10/1980, 36 y.o.   MRN: [id number]  HPI this patient presents to the office for continued evaluation and treatment of multiple diabetic ulcers both feet. He presents the office 10 weeks after his previous visit. He states that the ulcers were doing well but the other day he felt drainage from one of the sites of the ulcer on the left forefoot. He called made an appointment for an evaluation and treatment of his feet at that time   Review of Systems     Objective:   Physical Exam GENERAL APPEARANCE: Alert, conversant. Appropriately groomed. No acute distress.  VASCULAR: Pedal pulses are  palpable at  [facility] bilateral.  Capillary refill time is immediate to all digits,  Normal temperature gradient.  Digital hair growth is present bilateral  NEUROLOGIC: sensation is diminished  to 5.07 monofilament at 5/5 sites bilateral.  Light touch is intact bilateral, Muscle strength normal.  MUSCULOSKELETAL: acceptable muscle strength, tone and stability bilateral.  Intrinsic muscluature intact bilateral.  Rectus appearance of foot and digits noted bilateral.   DERMATOLOGIC: skin color, texture, and turgor are within normal limits.  No preulcerative lesions or ulcers  are seen, no interdigital maceration noted.  No open lesions present.  Digital nails are asymptomatic. No drainage noted.Right foot ulcers have continued to heal.  Left foot ulcer has persistant crust noted in the absence of redness or swelling or drainage.  No malodor noted.      Assessment:  Diabetic ulcers B/L     Plan:     Debride,ment of the crust on the forefoot B/L.  No infection or drainage noted.  RTC 7 weeks. For continued diabetic foot exam.   Helane GuntherGregory Aisia Correira DPM

## 2016-05-24 NOTE — Telephone Encounter (Signed)
[facility] [address].

## 2016-05-24 NOTE — Telephone Encounter (Signed)
Pt. Wife called stating that pt. Needs a refill on metoCLOPramide (REGLAN) 10 MG tablet.  Pt. Is out of the medication and his pharmacy stated he has no refills. He would [facility] the Rx [facility]  [facility] [address]. Please f/u

## 2016-05-25 ENCOUNTER — Other Ambulatory Visit (HOSPITAL_COMMUNITY): Payer: Self-pay | Admitting: Physician Assistant

## 2016-05-25 DIAGNOSIS — K3184 Gastroparesis: Secondary | ICD-10-CM

## 2016-05-25 DIAGNOSIS — R109 Unspecified abdominal pain: Secondary | ICD-10-CM

## 2016-05-25 DIAGNOSIS — R110 Nausea: Secondary | ICD-10-CM

## 2016-05-29 ENCOUNTER — Telehealth: Payer: Self-pay | Admitting: Internal Medicine

## 2016-05-29 NOTE — Telephone Encounter (Signed)
Patient called requesting status of handicap sticker, patient states he has a temp card and is unsure when it expires (june or July) Patient would like to know if he can get permeant card . Please f/up

## 2016-05-31 ENCOUNTER — Encounter (HOSPITAL_COMMUNITY)
Admission: RE | Admit: 2016-05-31 | Discharge: 2016-05-31 | Disposition: A | Discharge status: Home / Self Care | Payer: Medicaid Other | Source: Ambulatory Visit | Attending: Physician Assistant | Admitting: Physician Assistant

## 2016-05-31 DIAGNOSIS — R110 Nausea: Secondary | ICD-10-CM | POA: Diagnosis present

## 2016-05-31 DIAGNOSIS — K3184 Gastroparesis: Secondary | ICD-10-CM | POA: Diagnosis present

## 2016-05-31 DIAGNOSIS — R109 Unspecified abdominal pain: Secondary | ICD-10-CM | POA: Diagnosis present

## 2016-05-31 MED ORDER — TECHNETIUM TC 99M SULFUR COLLOID
2.0000 | Freq: Once | INTRAVENOUS | Status: AC | PRN
  Administered 2016-05-31: 2 via ORAL

## 2016-06-05 NOTE — Telephone Encounter (Signed)
-----   Message from Quentin Angstlugbemiga E Jegede, MD sent at 05/23/2016 12:31 PM EDT ----- Please inform patient that there is slight further decline in kidney function, hemoglobin A1c shows improvement in blood sugar control. Continue current medications and follow up with nephrologist as scheduled.

## 2016-06-05 NOTE — Telephone Encounter (Signed)
Patient verified DOB Patients wife is aware of patients kidney function showing a slight decline. Patient advised to continue current medications and keep FU with nephrologist. Patient has appointment at the end of this month. Wife states patient saw GI and was informed of patients stomach concerns not being severe and patient advised to continue current medications and await a FU call if necessary. Patients wife is also aware of placecard being presented to the provider for a PERMANENT request. Patient will receive a FU once paperwork is placed at the front desk for pickup. No further questions at this time.

## 2016-06-11 ENCOUNTER — Telehealth: Payer: Self-pay | Admitting: Internal Medicine

## 2016-06-11 NOTE — Telephone Encounter (Signed)
Patient's wife called requesting an appt for pt, states he has rash under arm and is now going down to genitals, we currently have no opening and was told to f/up with ucc. Please f/up

## 2016-06-13 ENCOUNTER — Emergency Department (HOSPITAL_COMMUNITY)
Admission: EM | Admit: 2016-06-13 | Discharge: 2016-06-13 | Disposition: A | Discharge status: Home / Self Care | Payer: Medicaid Other | Attending: Emergency Medicine | Admitting: Emergency Medicine

## 2016-06-13 ENCOUNTER — Encounter (HOSPITAL_COMMUNITY): Payer: Self-pay | Admitting: Family Medicine

## 2016-06-13 ENCOUNTER — Telehealth: Payer: Self-pay | Admitting: Internal Medicine

## 2016-06-13 DIAGNOSIS — Z794 Long term (current) use of insulin: Secondary | ICD-10-CM | POA: Diagnosis not present

## 2016-06-13 DIAGNOSIS — R21 Rash and other nonspecific skin eruption: Secondary | ICD-10-CM | POA: Insufficient documentation

## 2016-06-13 DIAGNOSIS — E119 Type 2 diabetes mellitus without complications: Secondary | ICD-10-CM | POA: Diagnosis not present

## 2016-06-13 DIAGNOSIS — Z87891 Personal history of nicotine dependence: Secondary | ICD-10-CM | POA: Insufficient documentation

## 2016-06-13 DIAGNOSIS — I10 Essential (primary) hypertension: Secondary | ICD-10-CM | POA: Diagnosis not present

## 2016-06-13 DIAGNOSIS — Z79899 Other long term (current) drug therapy: Secondary | ICD-10-CM | POA: Diagnosis not present

## 2016-06-13 MED ORDER — FLUCONAZOLE 200 MG PO TABS: 100.0000 mg | ORAL_TABLET | Freq: Every day | ORAL | Status: AC

## 2016-06-13 NOTE — Discharge Instructions (Signed)
Take your medications as prescribed. Follow-up with your primary care provider in the next week if your symptoms have not improved. Return to the emergency department if symptoms worsen or new onset of fever, worsening rash, abdominal pain, vomiting, drainage.

## 2016-06-13 NOTE — ED Notes (Signed)
pt here with rash under both arms and on genital area since sunday. sts cortisone has made it worse. sts painful

## 2016-06-13 NOTE — ED Notes (Signed)
Pt is in stable condition upon d/c and ambulates from ED. 

## 2016-06-13 NOTE — Telephone Encounter (Signed)
Patient would like to know status of permanent handicapped sticker

## 2016-06-13 NOTE — Telephone Encounter (Signed)
Patients wife called and would like handicapped sticker paperwork mailed to address on file.

## 2016-06-13 NOTE — ED Provider Notes (Signed)
CSN: [id number]     Arrival date & time 06/13/16  1136 History  By signing my name below, I, Dwayne [name], attest that this documentation has been prepared under the direction and in the presence of [name], PA-C. Electronically Signed: [name], ED Scribe. 06/13/2016. 12:59 PM.      Chief Complaint  Patient presents with  . Rash   Patient is a 36 y.o. male presenting with rash. The history is provided by the patient. No language interpreter was used.  Rash Associated symptoms: no abdominal pain, no fever, no nausea, no shortness of breath and not vomiting    HPI Comments: Dwayne [name] is a 36 y.o. male who presents to the Emergency Department complaining of worsening itchy rash to left axilla and left groin area onset 3 days prior. Pt states that he has noticed a foul smell coming from the rash. Pt denies any new medications, soaps, lotions, detergents, deodorants or new clothes. Pt states that he has stopped wearing deodorant with no relief. Pt denies drainage,  fever, abdominal pain, n/v, SOB, penile discharge, or dysuria.He reports using cortisone which made the rash worse.     Past Medical History  Diagnosis Date  . Hypertension   . Acute renal failure ([name])   . Bacteremia     MSSA   . Diabetes type 2, uncontrolled ([name]) 01/09/2015  . 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 Essential hypertension, benign 01/18/2014  . Infective myositis of left thigh   . Noncompliance 01/18/2014  . Septic shock ([name])   . Staphylococcus aureus bacteremia with sepsis ([name])   . Tobacco abuse 01/15/2014  . Mitral regurgitation 01/13/2015    Severe by TEE  . MSSA (methicillin susceptible Staphylococcus aureus)   . Type II diabetes mellitus ([facility])     not controlled  . Diabetic foot infection ([name]) 01/15/2014  . S/P minimally-invasive mitral valve repair 02/18/2015    Complex valvuloplasty including bovine pericardial patch repair of perforation of posterior leaflet with 28  mm Sorin Memo 3D ring annuloplasty via right mini thoracotomy approach    Past Surgical History  Procedure Laterality Date  . I&d extremity Bilateral 01/16/2014    Procedure:  DEBRIDEMENT of bilateral foot ulcers;  Surgeon: [name], MD;  Location: [place];  Service: Orthopedics;  Laterality: Bilateral;  . Tee without cardioversion N/A 01/13/2015    Procedure: TRANSESOPHAGEAL ECHOCARDIOGRAM (TEE);  Surgeon: [name], MD;  Location: [facility] ENDOSCOPY;  Service: Cardiovascular;  Laterality: N/A;  . Left and right heart catheterization with coronary angiogram N/A 01/24/2015    Procedure: LEFT AND RIGHT HEART CATHETERIZATION WITH CORONARY ANGIOGRAM;  Surgeon: [name], MD;  Location[facility] CATH LAB;  Service: Cardiovascular;  Laterality: N/A;  . Multiple extractions with alveoloplasty N/A 01/25/2015    Procedure: Extraction of tooth #'s 1,2,3,4,5,6,8,9,11,12,13,14,15,16,17,18,19 28,31,32 with alveoloplasty and gross debridement of remaining teeth.;  Surgeon: [name], DDS;  Location: [place];  Service: Oral Surgery;  Laterality: N/A;  . Cardiac catheterization  12/2014  . Shoulder arthroscopy Right 02/05/2015    Procedure: ARTHROSCOPY SHOULDER;  Surgeon: [name], MD;  Location: [place];  Service: Orthopedics;  Laterality: Right;  . Mitral valve replacement Right 02/18/2015    Procedure: MINIMALLY INVASIVE MITRAL VALVE (MV) REPAIR;  Surgeon: [name], MD;  Location: [place];  Service: Open Heart Surgery;  Laterality: Right;  . Tee without cardioversion N/A 02/18/2015    Procedure: TRANSESOPHAGEAL ECHOCARDIOGRAM (TEE);  Surgeon: [name], MD;  Location: [facility] OR;  Service: Open Heart Surgery;  Laterality: N/A;   Family History  Problem Relation Age of Onset  . Heart attack Neg Hx   . Stroke Maternal Uncle   . Hypertension Mother   . Diabetes Brother    Social History  Substance Use Topics  . Smoking status: Former Smoker -- 1.00 packs/day for 12 years    Types:  Cigars  . Smokeless tobacco: Never Used     Comment: black and mild cigars  . Alcohol Use: No    Review of Systems  Constitutional: Negative for fever.  Respiratory: Negative for shortness of breath.   Gastrointestinal: Negative for nausea, vomiting and abdominal pain.  Genitourinary: Negative for dysuria and discharge.  Skin: Positive for rash.     Allergies  Review of patient's allergies indicates no known allergies.  Home Medications   Prior to Admission medications   Medication Sig Start Date End Date Taking? Authorizing Provider  ACCU-CHEK AVIVA PLUS test strip USE TO TEST BLOOD SUGAR THREE TIMES A DAY 05/07/16   [name], MD  ACCU-CHEK SOFTCLIX LANCETS lancets USE TO TEST BLOOD SUGAR THREE TIMES A DAY 05/07/16   [name], MD  acetaminophen-codeine (TYLENOL #4) 300-60 MG tablet Take 1 tablet by mouth every 4 (four) hours as needed for moderate pain. 05/10/16   [name], MD  amLODipine (NORVASC) 10 MG tablet TAKE 1 TABLET BY MOUTH ONCE DAILY 05/07/16   [name], MD  Blood Pressure Monitoring (ADULT BLOOD PRESSURE CUFF LG) KIT Measure BP 2x daily 01/16/16   [name], MD  carvedilol (COREG) 6.25 MG tablet Take 1 tablet (6.25 mg total) by mouth 2 (two) times daily. 05/07/16   [name], MD  cloNIDine (CATAPRES) 0.3 MG tablet Take 1 tablet (0.3 mg total) by mouth 3 (three) times daily. 05/10/16   [name], MD  cyclobenzaprine (FLEXERIL) 10 MG tablet Take 1 tablet (10 mg total) by mouth 2 (two) times daily as needed for muscle spasms. 09/15/15   [name], MD  EASY TOUCH PEN NEEDLES 31G X 8 MM MISC USE TO INJECT INSULIN (LANTUS) AS DIRECTED 05/07/16   [name], MD  fluconazole (DIFLUCAN) 200 MG tablet Take 0.5 tablets (100 mg total) by mouth daily. 06/13/16 06/20/16  [name], PA-C  furosemide (LASIX) 40 MG tablet Take 1 tablet (40 mg total) by mouth 2 (two) times daily. 05/10/16   [name], MD  hydrALAZINE (APRESOLINE) 50 MG tablet Take 1 tablet (50 mg total) by mouth 3 (three) times daily. 12/05/15   [name], MD  insulin aspart (NOVOLOG FLEXPEN) 100 UNIT/ML FlexPen Inject 6 Units into the skin 3 (three) times daily with meals. 03/16/16   [name], MD  Insulin Glargine (LANTUS SOLOSTAR) 100 UNIT/ML Solostar Pen Inject 20 Units into the skin daily at 10 pm. 05/07/16   [name], MD  Insulin Pen Needle (B-D UF III MINI PEN NEEDLES) 31G X 5 MM MISC Use one needle with each injection of Lantus Pen daily. 12/10/15   [name], MD  Insulin Syringe-Needle U-100 28G X 1/2" 0.5 ML MISC Use as directed. Any generic available. 12/05/15   [name], MD  metoCLOPramide (REGLAN) 10 MG tablet Take 1 tablet (10 mg total) by mouth 4 (four) times daily -  before meals and at bedtime. 05/24/16   [name], MD  ondansetron (ZOFRAN) 4 MG tablet Take  1 tablet (4 mg total) by mouth every 8 (eight) hours as needed for nausea or vomiting. 05/10/16   [name], MD  pantoprazole (PROTONIX) 40 MG tablet Take 1 tablet (40 mg total) by mouth daily. 03/22/16   [name], MD  PARoxetine (PAXIL) 20 MG tablet Take 1 tablet (20 mg total) by mouth daily. 03/22/16   [name], MD  potassium chloride SA (K-DUR,KLOR-CON) 20 MEQ tablet Take 2 tablets daily 09/15/15   [name], MD  silver sulfADIAZINE (SILVADENE) 1 % cream Apply 1 application topically 2 (two) times daily. 01/05/16   Gardiner Barefoot, DPM  UNABLE TO FIND Reported on 01/16/2016 12/19/15   Historical Provider, MD   BP 122/77 mmHg  Pulse 61  Temp(Src) 97.7 F (36.5 C) (Oral)  Resp 16  SpO2 100%   Physical Exam  Constitutional: He is oriented to person, place, and time. He appears well-developed and well-nourished.  HENT:  Head: Normocephalic and atraumatic.  Mouth/Throat: Uvula is midline, oropharynx is clear and moist and mucous membranes are normal. No  trismus in the jaw. No oropharyngeal exudate, posterior oropharyngeal edema, posterior oropharyngeal erythema or tonsillar abscesses.  Eyes: Conjunctivae and EOM are normal. Right eye exhibits no discharge. Left eye exhibits no discharge. No scleral icterus.  Neck: Normal range of motion. Neck supple.  Cardiovascular: Normal rate, regular rhythm, normal heart sounds and intact distal pulses.   Pulmonary/Chest: Effort normal and breath sounds normal. No respiratory distress. He has no wheezes. He has no rales. He exhibits no tenderness.  Abdominal: Soft. Bowel sounds are normal. There is no tenderness. Hernia confirmed negative in the right inguinal area and confirmed negative in the left inguinal area.  Genitourinary: Testes normal and penis normal. Right testis shows no mass, no swelling and no tenderness. Left testis shows no mass, no swelling and no tenderness. No penile erythema or penile tenderness. No discharge found.  Musculoskeletal: Normal range of motion. He exhibits no edema.  Lymphadenopathy:       Right: No inguinal adenopathy present.       Left: No inguinal adenopathy present.  Neurological: He is alert and oriented to person, place, and time.  Skin: Skin is warm and dry. Rash noted.  Hyper pigmented erythematous plaque noted to left axilla and left groin with satellite lesions. No vesicles, pustules or bulla. No drainage noted.   Nursing note and vitals reviewed.   ED Course  Procedures (including critical care time) DIAGNOSTIC STUDIES: Oxygen Saturation is 99% on RA, normal by my interpretation.    COORDINATION OF CARE: 12:58 PM-Discussed treatment plan with pt at bedside and pt agreed to plan.     Labs Review Labs Reviewed - No data to display  Imaging Review No results found.    EKG Interpretation None      MDM   Final diagnoses:  Rash   Pt presents with itchy rash to left axilla and left groin. Denies fever. VSS. Exam revealed hyperpigmented  erythematous plaque noted to left axilla and left groin, no drainage. Rash consistent with candidal infection. Pt with hx of DM, he reports his BGL was 74 this morning and has been under control at home. Remaining exam unremarkable. Plan to d/c pt home with rx for fluconazole and PCP follow up in 2-3 days. Discussed return precautions with pt.   I personally performed the services described in this documentation, which was scribed in my presence. The recorded information has been reviewed and is accurate.  Chesley Noon [place], [place] 06/13/16 1625  Sherwood Gambler, MD 06/19/16 [phone]

## 2016-06-21 ENCOUNTER — Ambulatory Visit (INDEPENDENT_AMBULATORY_CARE_PROVIDER_SITE_OTHER): Payer: Medicaid Other | Admitting: Podiatry

## 2016-06-21 ENCOUNTER — Encounter: Payer: Self-pay | Admitting: Podiatry

## 2016-06-21 VITALS — BP 139/86 | HR 64 | Resp 14

## 2016-06-21 DIAGNOSIS — L97519 Non-pressure chronic ulcer of other part of right foot with unspecified severity: Secondary | ICD-10-CM | POA: Diagnosis not present

## 2016-06-21 DIAGNOSIS — L97529 Non-pressure chronic ulcer of other part of left foot with unspecified severity: Secondary | ICD-10-CM | POA: Diagnosis not present

## 2016-06-21 DIAGNOSIS — E10621 Type 1 diabetes mellitus with foot ulcer: Secondary | ICD-10-CM

## 2016-06-21 DIAGNOSIS — E1151 Type 2 diabetes mellitus with diabetic peripheral angiopathy without gangrene: Secondary | ICD-10-CM

## 2016-06-21 NOTE — Progress Notes (Signed)
Subjective:     Patient ID: Rob HickmanBenjamin Zerbe, male   DOB: 08/25/1980, 36 y.o.   MRN: [id number]  HPI this patient presents to the office for continued evaluation of diabetic ulcers forefeet bilateral. He was seen approximately 4 weeks ago, not having any problems and the areas were debrided.  This patient presents the office today stating that there is bleeding and drainage and an odor coming from both ulcers forefeet bilaterally. The wife says that it started when he went swimming and went to [place] following the visit to the [place] the ulcers burst open and she says has been draining and bleeding since last week . The wife says that his blood sugar is under better control and she is puzzled  the ulcers broke down since this patient usually is nonweightbearing at home. He presents the office today for an evaluation and treatment of his ulcers   Review of Systems     Objective:   Physical Exam GENERAL APPEARANCE: Alert, conversant. Appropriately groomed. No acute distress.  VASCULAR: Pedal pulses are  palpable at  [facility] and [facility] bilateral.  Capillary refill time is immediate to all digits,  Normal temperature gradient.  Digital hair growth is present bilateral  NEUROLOGIC: sensation is diminished l to 5.07 monofilament at 5/5 sites bilateral.  Light touch is intact bilateral, Muscle strength normal.  MUSCULOSKELETAL: acceptable muscle strength, tone and stability bilateral.  Intrinsic muscluature intact bilateral.  Rectus appearance of foot and digits noted bilateral.   DERMATOLOGIC:Diabetic ulcer, sub-1 of the right foot reveals necrotic tissue. No evidence of any redness, swelling, drainage or infection. There is a measured 8 mm x 8 mm diabetic ulcer, sub-1 covered by a callus roof. His left foot has a measured ulcer. A 15 x 15 mm approximately with necrotic tissue noted in the center ulcer and it too is covered by a hyperkeratotic roof. No evidence of any redness, swelling or infection or drainage  noted      Assessment:     Diabetic ulcers forefeet B/L     Plan:     ROV  Debride necrotic tissue in ulcer sub 1 right and sub 2 left forefoot. Silvadene/DSD.  Home soaking instructions followed by bandaging with silvadene.  Told him to ambulate with cam walker.  No infection noted.  RTC 2 weeks.   Helane GuntherGregory Elbert Polyakov DPM

## 2016-06-21 NOTE — Telephone Encounter (Signed)
Place-card has been mailed to the patient.

## 2016-07-05 ENCOUNTER — Ambulatory Visit (INDEPENDENT_AMBULATORY_CARE_PROVIDER_SITE_OTHER): Payer: Medicaid Other | Admitting: Podiatry

## 2016-07-05 DIAGNOSIS — E1151 Type 2 diabetes mellitus with diabetic peripheral angiopathy without gangrene: Secondary | ICD-10-CM

## 2016-07-05 DIAGNOSIS — E10621 Type 1 diabetes mellitus with foot ulcer: Secondary | ICD-10-CM

## 2016-07-05 DIAGNOSIS — L97529 Non-pressure chronic ulcer of other part of left foot with unspecified severity: Secondary | ICD-10-CM

## 2016-07-05 DIAGNOSIS — L97501 Non-pressure chronic ulcer of other part of unspecified foot limited to breakdown of skin: Secondary | ICD-10-CM | POA: Diagnosis not present

## 2016-07-05 DIAGNOSIS — L97519 Non-pressure chronic ulcer of other part of right foot with unspecified severity: Principal | ICD-10-CM

## 2016-07-05 NOTE — Progress Notes (Signed)
Subjective:     Patient ID: Dwayne [name], male   DOB: 08/15/1980, 36 y.o.   MRN: [id number]  HPI this patient presents to the office for continued evaluation and treatment of multiple diabetic ulcers both feet. He presents the office 12 weeks after his previous visit. He states that the ulcers were doing well  And only the ulcer under the ball of his right foot has had any drainage.  He has been soaking his ulcer and using his cam walker left foot.   Review of Systems     Objective:   Physical Exam GENERAL APPEARANCE: Alert, conversant. Appropriately groomed. No acute distress.  VASCULAR: Pedal pulses are  palpable at  [facility] bilateral.  Capillary refill time is immediate to all digits,  Normal temperature gradient.  Digital hair growth is present bilateral  NEUROLOGIC: sensation is diminished  to 5.07 monofilament at 5/5 sites bilateral.  Light touch is intact bilateral, Muscle strength normal.  MUSCULOSKELETAL: acceptable muscle strength, tone and stability bilateral.  Intrinsic muscluature intact bilateral.  Rectus appearance of foot and digits noted bilateral.   DERMATOLOGIC: skin color, texture, and turgor are within normal limits.   No open lesions present.  Digital nails are asymptomatic. No drainage noted  .Right foot ulcers have continued to heal with small 2 mm. X 2 mm. Ulcer in center skin lesion sub 1 right foot.  Left foot ulcer has persistant crust noted in the absence of redness or swelling or drainage.  No malodor noted.      Assessment:  Diabetic ulcers B/L     Plan:     Debridement of the crust on the forefoot B/L.  No infection or drainage noted.  RTC 4 weeks. For continued diabetic foot exam. Told her to apply silvadene to right ulcer.  Told him he would benefit from vaseline usage to help soften crust left forefoot.   Helane GuntherGregory Alton Bouknight DPM

## 2016-07-09 ENCOUNTER — Inpatient Hospital Stay (HOSPITAL_COMMUNITY)
Admission: EM | Admit: 2016-07-09 | Discharge: 2016-07-10 | DRG: 189 | Discharge status: Against Medical Advice | Payer: Medicaid Other | Attending: Internal Medicine | Admitting: Internal Medicine

## 2016-07-09 ENCOUNTER — Ambulatory Visit
Admission: RE | Admit: 2016-07-09 | Discharge: 2016-07-09 | Disposition: A | Discharge status: Home / Self Care | Payer: Medicaid Other | Source: Ambulatory Visit | Attending: Gastroenterology | Admitting: Gastroenterology

## 2016-07-09 ENCOUNTER — Other Ambulatory Visit: Payer: Self-pay | Admitting: Gastroenterology

## 2016-07-09 ENCOUNTER — Encounter (HOSPITAL_COMMUNITY): Payer: Self-pay | Admitting: Emergency Medicine

## 2016-07-09 DIAGNOSIS — J189 Pneumonia, unspecified organism: Secondary | ICD-10-CM | POA: Diagnosis present

## 2016-07-09 DIAGNOSIS — Z794 Long term (current) use of insulin: Secondary | ICD-10-CM

## 2016-07-09 DIAGNOSIS — N185 Chronic kidney disease, stage 5: Secondary | ICD-10-CM | POA: Diagnosis present

## 2016-07-09 DIAGNOSIS — R7989 Other specified abnormal findings of blood chemistry: Secondary | ICD-10-CM | POA: Diagnosis not present

## 2016-07-09 DIAGNOSIS — E10319 Type 1 diabetes mellitus with unspecified diabetic retinopathy without macular edema: Secondary | ICD-10-CM | POA: Diagnosis present

## 2016-07-09 DIAGNOSIS — I132 Hypertensive heart and chronic kidney disease with heart failure and with stage 5 chronic kidney disease, or end stage renal disease: Secondary | ICD-10-CM | POA: Diagnosis present

## 2016-07-09 DIAGNOSIS — R05 Cough: Secondary | ICD-10-CM | POA: Diagnosis not present

## 2016-07-09 DIAGNOSIS — I160 Hypertensive urgency: Secondary | ICD-10-CM | POA: Diagnosis present

## 2016-07-09 DIAGNOSIS — D631 Anemia in chronic kidney disease: Secondary | ICD-10-CM | POA: Diagnosis present

## 2016-07-09 DIAGNOSIS — R5382 Chronic fatigue, unspecified: Secondary | ICD-10-CM

## 2016-07-09 DIAGNOSIS — R0902 Hypoxemia: Secondary | ICD-10-CM | POA: Diagnosis present

## 2016-07-09 DIAGNOSIS — R748 Abnormal levels of other serum enzymes: Secondary | ICD-10-CM | POA: Diagnosis present

## 2016-07-09 DIAGNOSIS — L97519 Non-pressure chronic ulcer of other part of right foot with unspecified severity: Secondary | ICD-10-CM | POA: Diagnosis present

## 2016-07-09 DIAGNOSIS — E10621 Type 1 diabetes mellitus with foot ulcer: Secondary | ICD-10-CM | POA: Diagnosis present

## 2016-07-09 DIAGNOSIS — Z9119 Patient's noncompliance with other medical treatment and regimen: Secondary | ICD-10-CM | POA: Diagnosis not present

## 2016-07-09 DIAGNOSIS — I5033 Acute on chronic diastolic (congestive) heart failure: Secondary | ICD-10-CM | POA: Diagnosis not present

## 2016-07-09 DIAGNOSIS — I5032 Chronic diastolic (congestive) heart failure: Secondary | ICD-10-CM | POA: Diagnosis present

## 2016-07-09 DIAGNOSIS — L97529 Non-pressure chronic ulcer of other part of left foot with unspecified severity: Secondary | ICD-10-CM | POA: Diagnosis present

## 2016-07-09 DIAGNOSIS — F17290 Nicotine dependence, other tobacco product, uncomplicated: Secondary | ICD-10-CM | POA: Diagnosis present

## 2016-07-09 DIAGNOSIS — R079 Chest pain, unspecified: Secondary | ICD-10-CM

## 2016-07-09 DIAGNOSIS — I129 Hypertensive chronic kidney disease with stage 1 through stage 4 chronic kidney disease, or unspecified chronic kidney disease: Secondary | ICD-10-CM | POA: Diagnosis present

## 2016-07-09 DIAGNOSIS — I509 Heart failure, unspecified: Secondary | ICD-10-CM

## 2016-07-09 DIAGNOSIS — I059 Rheumatic mitral valve disease, unspecified: Secondary | ICD-10-CM | POA: Diagnosis present

## 2016-07-09 DIAGNOSIS — E1029 Type 1 diabetes mellitus with other diabetic kidney complication: Secondary | ICD-10-CM | POA: Diagnosis present

## 2016-07-09 DIAGNOSIS — I340 Nonrheumatic mitral (valve) insufficiency: Secondary | ICD-10-CM | POA: Diagnosis present

## 2016-07-09 DIAGNOSIS — R0600 Dyspnea, unspecified: Secondary | ICD-10-CM | POA: Diagnosis not present

## 2016-07-09 DIAGNOSIS — D649 Anemia, unspecified: Secondary | ICD-10-CM | POA: Diagnosis not present

## 2016-07-09 DIAGNOSIS — R778 Other specified abnormalities of plasma proteins: Secondary | ICD-10-CM | POA: Diagnosis present

## 2016-07-09 DIAGNOSIS — E1022 Type 1 diabetes mellitus with diabetic chronic kidney disease: Secondary | ICD-10-CM | POA: Diagnosis present

## 2016-07-09 DIAGNOSIS — J810 Acute pulmonary edema: Principal | ICD-10-CM | POA: Diagnosis present

## 2016-07-09 DIAGNOSIS — R059 Cough, unspecified: Secondary | ICD-10-CM

## 2016-07-09 LAB — COMPREHENSIVE METABOLIC PANEL
ALT: 38 U/L (ref 17–63)
ANION GAP: 10 (ref 5–15)
AST: 19 U/L (ref 15–41)
Albumin: 3 g/dL — ABNORMAL LOW (ref 3.5–5.0)
Alkaline Phosphatase: 97 U/L (ref 38–126)
BUN: 59 mg/dL — AB (ref 6–20)
CHLORIDE: 110 mmol/L (ref 101–111)
CO2: 20 mmol/L — ABNORMAL LOW (ref 22–32)
Calcium: 9 mg/dL (ref 8.9–10.3)
Creatinine, Ser: 5.86 mg/dL — ABNORMAL HIGH (ref 0.61–1.24)
GFR, EST AFRICAN AMERICAN: 13 mL/min — AB (ref >60)
GFR, EST NON AFRICAN AMERICAN: 11 mL/min — AB (ref >60)
Glucose, Bld: 141 mg/dL — ABNORMAL HIGH (ref 65–99)
POTASSIUM: 4.1 mmol/L (ref 3.5–5.1)
Sodium: 140 mmol/L (ref 135–145)
Total Bilirubin: 0.5 mg/dL (ref 0.3–1.2)
Total Protein: 6.8 g/dL (ref 6.5–8.1)

## 2016-07-09 LAB — CBC WITH DIFFERENTIAL/PLATELET
BASOS ABS: 0 10*3/uL (ref 0.0–0.1)
BASOS PCT: 0 %
EOS PCT: 7 %
Eosinophils Absolute: 0.6 10*3/uL (ref 0.0–0.7)
HCT: 29.1 % — ABNORMAL LOW (ref 39.0–52.0)
Hemoglobin: 9.5 g/dL — ABNORMAL LOW (ref 13.0–17.0)
LYMPHS PCT: 13 %
Lymphs Abs: 1.1 10*3/uL (ref 0.7–4.0)
MCH: 27.9 pg (ref 26.0–34.0)
MCHC: 32.6 g/dL (ref 30.0–36.0)
MCV: 85.6 fL (ref 78.0–100.0)
MONO ABS: 0.8 10*3/uL (ref 0.1–1.0)
Monocytes Relative: 10 %
Neutro Abs: 5.7 10*3/uL (ref 1.7–7.7)
Neutrophils Relative %: 70 %
PLATELETS: 265 10*3/uL (ref 150–400)
RBC: 3.4 MIL/uL — ABNORMAL LOW (ref 4.22–5.81)
RDW: 13.6 % (ref 11.5–15.5)
WBC: 8.2 10*3/uL (ref 4.0–10.5)

## 2016-07-09 LAB — I-STAT CG4 LACTIC ACID, ED: LACTIC ACID, VENOUS: 0.64 mmol/L (ref 0.5–1.9)

## 2016-07-09 LAB — BRAIN NATRIURETIC PEPTIDE: B NATRIURETIC PEPTIDE 5: 378.4 pg/mL — AB (ref 0.0–100.0)

## 2016-07-09 LAB — TROPONIN I: TROPONIN I: 0.11 ng/mL — AB (ref <0.03)

## 2016-07-09 LAB — GLUCOSE, CAPILLARY: GLUCOSE-CAPILLARY: 178 mg/dL — AB (ref 65–99)

## 2016-07-09 MED ORDER — SODIUM BICARBONATE 650 MG PO TABS
650.0000 mg | ORAL_TABLET | Freq: Two times a day (BID) | ORAL | Status: DC
  Administered 2016-07-10 (×2): 650 mg via ORAL
  Filled 2016-07-09 (×2): qty 1

## 2016-07-09 MED ORDER — LEVOFLOXACIN IN D5W 750 MG/150ML IV SOLN: 750.0000 mg | INTRAVENOUS | Status: DC

## 2016-07-09 MED ORDER — ONDANSETRON HCL 4 MG/2ML IJ SOLN: 4.0000 mg | Freq: Four times a day (QID) | INTRAMUSCULAR | Status: DC | PRN

## 2016-07-09 MED ORDER — FUROSEMIDE 80 MG PO TABS
80.0000 mg | ORAL_TABLET | Freq: Every evening | ORAL | Status: DC
  Filled 2016-07-09: qty 1

## 2016-07-09 MED ORDER — LEVOFLOXACIN IN D5W 750 MG/150ML IV SOLN
750.0000 mg | Freq: Once | INTRAVENOUS | Status: AC
  Administered 2016-07-09: 750 mg via INTRAVENOUS
  Filled 2016-07-09: qty 150

## 2016-07-09 MED ORDER — ACETAMINOPHEN-CODEINE #4 300-60 MG PO TABS: 1.0000 | ORAL_TABLET | ORAL | Status: DC | PRN

## 2016-07-09 MED ORDER — HYDRALAZINE HCL 50 MG PO TABS
50.0000 mg | ORAL_TABLET | Freq: Three times a day (TID) | ORAL | Status: DC
  Administered 2016-07-10 (×3): 50 mg via ORAL
  Filled 2016-07-09 (×3): qty 1

## 2016-07-09 MED ORDER — VITAMIN D (ERGOCALCIFEROL) 1.25 MG (50000 UNIT) PO CAPS: 50000.0000 [IU] | ORAL_CAPSULE | ORAL | Status: DC

## 2016-07-09 MED ORDER — HEPARIN SODIUM (PORCINE) 5000 UNIT/ML IJ SOLN
5000.0000 [IU] | Freq: Three times a day (TID) | INTRAMUSCULAR | Status: DC
Start: 2016-07-09 — End: 2016-07-11
  Filled 2016-07-09: qty 1

## 2016-07-09 MED ORDER — CARVEDILOL 3.125 MG PO TABS
3.1250 mg | ORAL_TABLET | Freq: Two times a day (BID) | ORAL | Status: DC
  Administered 2016-07-10 (×3): 3.125 mg via ORAL
  Filled 2016-07-09 (×3): qty 1

## 2016-07-09 MED ORDER — SODIUM CHLORIDE 0.9 % IV BOLUS (SEPSIS): 1000.0000 mL | Freq: Once | INTRAVENOUS | Status: DC

## 2016-07-09 MED ORDER — DEXTROSE 5 % IV SOLN: 1.0000 g | Freq: Three times a day (TID) | INTRAVENOUS | Status: DC

## 2016-07-09 MED ORDER — INSULIN GLARGINE 100 UNIT/ML ~~LOC~~ SOLN
20.0000 [IU] | Freq: Every day | SUBCUTANEOUS | Status: DC
  Administered 2016-07-10: 20 [IU] via SUBCUTANEOUS
  Filled 2016-07-09 (×2): qty 0.2

## 2016-07-09 MED ORDER — ACETAMINOPHEN 650 MG RE SUPP: 650.0000 mg | Freq: Four times a day (QID) | RECTAL | Status: DC | PRN

## 2016-07-09 MED ORDER — FUROSEMIDE 10 MG/ML IJ SOLN
80.0000 mg | Freq: Once | INTRAMUSCULAR | Status: AC
Start: 2016-07-09 — End: 2016-07-09
  Administered 2016-07-09: 80 mg via INTRAVENOUS
  Filled 2016-07-09: qty 8

## 2016-07-09 MED ORDER — ACETAMINOPHEN 325 MG PO TABS: 650.0000 mg | ORAL_TABLET | Freq: Four times a day (QID) | ORAL | Status: DC | PRN

## 2016-07-09 MED ORDER — FENTANYL CITRATE (PF) 100 MCG/2ML IJ SOLN: 100.0000 ug | Freq: Once | INTRAMUSCULAR | Status: DC

## 2016-07-09 MED ORDER — ONDANSETRON HCL 4 MG PO TABS: 4.0000 mg | ORAL_TABLET | Freq: Three times a day (TID) | ORAL | Status: DC | PRN

## 2016-07-09 MED ORDER — CYCLOBENZAPRINE HCL 10 MG PO TABS: 10.0000 mg | ORAL_TABLET | Freq: Two times a day (BID) | ORAL | Status: DC | PRN

## 2016-07-09 MED ORDER — PANTOPRAZOLE SODIUM 40 MG PO TBEC
40.0000 mg | DELAYED_RELEASE_TABLET | Freq: Every day | ORAL | Status: DC
  Administered 2016-07-10: 40 mg via ORAL
  Filled 2016-07-09 (×2): qty 1

## 2016-07-09 MED ORDER — AMLODIPINE BESYLATE 10 MG PO TABS
10.0000 mg | ORAL_TABLET | Freq: Every day | ORAL | Status: DC
  Administered 2016-07-10: 10 mg via ORAL
  Filled 2016-07-09: qty 1

## 2016-07-09 MED ORDER — INSULIN ASPART 100 UNIT/ML ~~LOC~~ SOLN
6.0000 [IU] | Freq: Three times a day (TID) | SUBCUTANEOUS | Status: DC
  Administered 2016-07-10: 6 [IU] via SUBCUTANEOUS

## 2016-07-09 MED ORDER — ONDANSETRON HCL 4 MG PO TABS: 4.0000 mg | ORAL_TABLET | Freq: Four times a day (QID) | ORAL | Status: DC | PRN

## 2016-07-09 MED ORDER — METOCLOPRAMIDE HCL 10 MG PO TABS
10.0000 mg | ORAL_TABLET | Freq: Three times a day (TID) | ORAL | Status: DC
  Administered 2016-07-10 (×3): 10 mg via ORAL
  Filled 2016-07-09 (×3): qty 1

## 2016-07-09 MED ORDER — CLONIDINE HCL 0.3 MG PO TABS
0.3000 mg | ORAL_TABLET | Freq: Three times a day (TID) | ORAL | Status: DC
  Administered 2016-07-10 (×3): 0.3 mg via ORAL
  Filled 2016-07-09 (×3): qty 1

## 2016-07-09 MED ORDER — SODIUM CHLORIDE 0.9% FLUSH
3.0000 mL | Freq: Two times a day (BID) | INTRAVENOUS | Status: DC
  Administered 2016-07-10 (×2): 3 mL via INTRAVENOUS

## 2016-07-09 MED ORDER — ACETAMINOPHEN-CODEINE #3 300-30 MG PO TABS: 1.0000 | ORAL_TABLET | ORAL | Status: DC | PRN

## 2016-07-09 MED ORDER — INSULIN ASPART 100 UNIT/ML ~~LOC~~ SOLN
0.0000 [IU] | Freq: Three times a day (TID) | SUBCUTANEOUS | Status: DC
  Administered 2016-07-10: 2 [IU] via SUBCUTANEOUS

## 2016-07-09 NOTE — H&P (Signed)
History and Physical    Dwayne [name] TKW:[id number] DOB: 1980-06-15 DOA: 07/09/2016  PCP: Angelica Chessman, MD  Patient coming from: Home.  Chief Complaint: Cough and right-sided chest pain.  HPI: Dwayne [name] is a 36 y.o. 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 Patient has been having these symptoms since yesterday. Also had 2 episodes of vomiting this morning but denies any abdominal pain. Chest x-ray shows congestion versus pneumonia. On my exam patient is still having cough. Patient was admitted last month for 23 hours observation for hyperkalemia which improved with Kayexalate at [facility]. Patient is being followed by nephrologist at [facility]. Initial plan was to transfer to [facility] but there was no bed available. Patient is presently chest pain free. Pain happened last night when coughing. More of pleuritic type. He also was given his home dose of Lasix in the ER. 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  ED Course: Levaquin was given for possible pneumonia along with Lasix 80 mg.  Review of Systems: As per HPI, rest all negative.   Past Medical History  Diagnosis Date  . Hypertension   . Acute renal failure ([place])   . Bacteremia     MSSA   . Diabetes type 2, uncontrolled ([place]) 01/09/2015  . 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 Essential hypertension, benign 01/18/2014  . Infective myositis of left thigh   . Noncompliance 01/18/2014  . Septic shock ([place])   . Staphylococcus aureus bacteremia with sepsis ([place])   . Tobacco abuse 01/15/2014  . Mitral regurgitation 01/13/2015    Severe by TEE  . MSSA  (methicillin susceptible Staphylococcus aureus)   . Type II diabetes mellitus (HCC)     not controlled  . Diabetic foot infection ([place]) 01/15/2014  . 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 I&d extremity Bilateral 01/16/2014    Procedure:  DEBRIDEMENT of bilateral foot ulcers;  Surgeon: [name], MD;  Location: [place];  Service: Orthopedics;  Laterality: Bilateral;  . Tee without cardioversion N/A 01/13/2015    Procedure: TRANSESOPHAGEAL ECHOCARDIOGRAM (TEE);  Surgeon: [name], MD;  Location: [facility] ENDOSCOPY;  Service: Cardiovascular;  Laterality: N/A;  . Left and right heart catheterization with coronary angiogram N/A 01/24/2015    Procedure: LEFT AND RIGHT HEART CATHETERIZATION WITH CORONARY ANGIOGRAM;  Surgeon: [name], MD;  Location: [facility] CATH LAB;  Service: Cardiovascular;  Laterality: N/A;  . Multiple extractions with alveoloplasty N/A 01/25/2015    Procedure: Extraction of tooth #'s 1,2,3,4,5,6,8,9,11,12,13,14,15,16,17,18,19 28,31,32 with alveoloplasty and gross debridement of remaining teeth.;  Surgeon: [name], DDS;  Location: [place];  Service: Oral Surgery;  Laterality: N/A;  . Cardiac catheterization  12/2014  . Shoulder arthroscopy Right 02/05/2015    Procedure: ARTHROSCOPY SHOULDER;  Surgeon: [name], MD;  Location: [place];  Service: Orthopedics;  Laterality: Right;  . Mitral valve replacement Right 02/18/2015    Procedure: MINIMALLY INVASIVE MITRAL VALVE (MV) REPAIR;  Surgeon:  [name], MD;  Location: [place];  Service: Open Heart Surgery;  Laterality: Right;  . Tee without cardioversion N/A 02/18/2015    Procedure: TRANSESOPHAGEAL ECHOCARDIOGRAM (TEE);  Surgeon: [name], MD;  Location: [place];   Service: Open Heart Surgery;  Laterality: N/A;     reports that he has been smoking Cigars.  He has never used smokeless tobacco. He reports that he does not drink alcohol or use illicit drugs.  No Known Allergies  Family History  Problem Relation Age of Onset  . Heart attack Neg Hx   . Stroke Maternal Uncle   . Hypertension Mother   . Diabetes Brother     Prior to Admission medications   Medication Sig Start Date End Date Taking? Authorizing Provider  acetaminophen-codeine (TYLENOL #4) 300-60 MG tablet Take 1 tablet by mouth every 4 (four) hours as needed for moderate pain. 05/10/16  Yes [name], MD  amLODipine (NORVASC) 10 MG tablet Take 10 mg by mouth.   Yes Historical Provider, MD  carvedilol (COREG) 3.125 MG tablet Take 3.125 mg by mouth 2 (two) times daily with a meal.   Yes Historical Provider, MD  cloNIDine (CATAPRES) 0.3 MG tablet Take 0.3 mg by mouth.   Yes Historical Provider, MD  cyclobenzaprine (FLEXERIL) 10 MG tablet Take 1 tablet (10 mg total) by mouth 2 (two) times daily as needed for muscle spasms. 09/15/15  Yes [name], MD  furosemide (LASIX) 80 MG tablet Take 40 mg by mouth 2 (two) times daily.  06/23/16  Yes Historical Provider, MD  hydrALAZINE (APRESOLINE) 50 MG tablet Take 50 mg by mouth.   Yes Historical Provider, MD  insulin aspart (NOVOLOG FLEXPEN) 100 UNIT/ML FlexPen Inject 6 Units into the skin 3 (three) times daily with meals. 03/16/16  Yes [name], MD  Insulin Glargine (LANTUS SOLOSTAR) 100 UNIT/ML Solostar Pen Inject 20 Units into the skin daily at 10 pm. 05/07/16  Yes [name], MD  metoCLOPramide (REGLAN) 10 MG tablet Take 10 mg by mouth.   Yes Historical Provider, MD  ondansetron (ZOFRAN) 4 MG tablet Take 1 tablet (4 mg total) by mouth every 8 (eight) hours as needed for nausea or vomiting. 05/10/16  Yes [name], MD  pantoprazole (PROTONIX) 40 MG tablet Take 1 tablet (40 mg total) by mouth daily. 03/22/16   Yes [name], MD  sodium bicarbonate 650 MG tablet Take 650 mg by mouth. 06/23/16  Yes Historical Provider, MD  Vitamin D, Ergocalciferol, (DRISDOL) 50000 units CAPS capsule Take by mouth. 06/26/16  Yes Historical Provider, MD  [place] test strip USE TO TEST BLOOD SUGAR THREE TIMES A DAY 05/07/16   [name], MD  ACCU-CHEK SOFTCLIX LANCETS lancets USE TO TEST BLOOD SUGAR THREE TIMES A DAY 05/07/16   [name], MD  Blood Pressure Monitoring (ADULT BLOOD PRESSURE CUFF LG) KIT Measure BP 2x daily 01/16/16   Olugbemiga E Doreene Burke, MD  EASY TOUCH PEN NEEDLES 31G X 8 MM MISC USE TO INJECT INSULIN (LANTUS) AS DIRECTED 05/07/16   [name], MD  Insulin Pen Needle (B-D UF III MINI PEN NEEDLES) 31G X 5 MM MISC Use one needle with each injection of Lantus Pen daily. 12/10/15   [name], MD  Insulin Syringe-Needle U-100 28G X 1/2" 0.5 ML MISC Use as directed. Any generic available. 12/05/15   [name], MD  PARoxetine (PAXIL) 20 MG tablet Take 1 tablet (20  mg total) by mouth daily. 03/22/16   [name], MD  potassium chloride SA (K-DUR,KLOR-CON) 20 MEQ tablet Take 2 tablets daily 09/15/15   [name], MD  silver sulfADIAZINE (SILVADENE) 1 % cream Apply 1 application topically 2 (two) times daily. 01/05/16   Gardiner Barefoot, DPM  UNABLE TO FIND Reported on 01/16/2016 12/19/15   Historical Provider, MD    Physical Exam: Filed Vitals:   07/09/16 2130 07/09/16 2200 07/09/16 2229 07/09/16 2254  BP: 161/97 156/94 156/94 174/101  Pulse: 89 91 90 91  Temp:    97.9 F (36.6 C)  TempSrc:    Oral  Resp: 40 28 18 18   Height:    6' 2"  (1.88 m)  Weight:    244 lb 11.2 oz (110.995 kg)  SpO2: 93% 96% 98% 98%      Constitutional: Not in distress. Filed Vitals:   07/09/16 2130 07/09/16 2200 07/09/16 2229 07/09/16 2254  BP: 161/97 156/94 156/94 174/101  Pulse: 89 91 90 91  Temp:    97.9 F (36.6 C)  TempSrc:    Oral  Resp: 40 28 18  18   Height:    6' 2"  (1.88 m)  Weight:    244 lb 11.2 oz (110.995 kg)  SpO2: 93% 96% 98% 98%   Eyes: Anicteric no pallor. ENMT: No discharge from the ears eyes nose and mouth. Neck: No mass felt. No JVD appreciated. Respiratory: No rhonchi or crepitations. Cardiovascular: S1 and S2 heard. Abdomen: Soft nontender bowel sounds present. Musculoskeletal: Bilateral foot ulcers. Skin: Bilateral foot ulcers no active discharge. Neurologic: Alert awake oriented to time place and person. Moves all extremities. Psychiatric: Appears normal.   Labs on Admission: I have personally reviewed following labs and imaging studies  CBC:  Recent Labs Lab 07/09/16 1538  WBC 8.2  NEUTROABS 5.7  HGB 9.5*  HCT 29.1*  MCV 85.6  PLT 291   Basic Metabolic Panel:  Recent Labs Lab 07/09/16 1538  NA 140  K 4.1  CL 110  CO2 20*  GLUCOSE 141*  BUN 59*  CREATININE 5.86*  CALCIUM 9.0   GFR: Estimated Creatinine Clearance: 23.1 mL/min (by C-G formula based on Cr of 5.86). Liver Function Tests:  Recent Labs Lab 07/09/16 1538  AST 19  ALT 38  ALKPHOS 97  BILITOT 0.5  PROT 6.8  ALBUMIN 3.0*   No results for input(s): LIPASE, AMYLASE in the last 168 hours. No results for input(s): AMMONIA in the last 168 hours. Coagulation Profile: No results for input(s): INR, PROTIME in the last 168 hours. Cardiac Enzymes:  Recent Labs Lab 07/09/16 1538  TROPONINI 0.11*   BNP (last 3 results) No results for input(s): PROBNP in the last 8760 hours. HbA1C: No results for input(s): HGBA1C in the last 72 hours. CBG:  Recent Labs Lab 07/09/16 2259  GLUCAP 178*   Lipid Profile: No results for input(s): CHOL, HDL, LDLCALC, TRIG, CHOLHDL, LDLDIRECT in the last 72 hours. Thyroid Function Tests: No results for input(s): TSH, T4TOTAL, FREET4, T3FREE, THYROIDAB in the last 72 hours. Anemia Panel: No results for input(s): VITAMINB12, FOLATE, FERRITIN, TIBC, IRON, RETICCTPCT in the last 72  hours. Urine analysis:    Component Value Date/Time   COLORURINE YELLOW 02/18/2015 0423   APPEARANCEUR CLEAR 02/18/2015 0423   LABSPEC 1.013 02/18/2015 0423   PHURINE 7.5 02/18/2015 0423   GLUCOSEU 100* 02/18/2015 0423   HGBUR NEGATIVE 02/18/2015 0423   BILIRUBINUR neg 08/18/2015 1129   [place] NEGATIVE 02/18/2015 0423  KETONESUR NEGATIVE 02/18/2015 0423   PROTEINUR >=300 08/18/2015 1129   PROTEINUR 100* 02/18/2015 0423   UROBILINOGEN 0.2 08/18/2015 1129   UROBILINOGEN 0.2 02/18/2015 0423   NITRITE neg 08/18/2015 1129   NITRITE NEGATIVE 02/18/2015 0423   LEUKOCYTESUR Negative 08/18/2015 1129   Sepsis Labs: @LABRCNTIP (procalcitonin:4,lacticidven:4) )No results found for this or any previous visit (from the past 240 hour(s)).   Radiological Exams on Admission: Dg Chest 2 View  07/09/2016  CLINICAL DATA:  Cough, shortness of breath, chest pain, former smoker; history of diabetes valvular heart disease, CHF, pericarditis. EXAM: CHEST  2 VIEW COMPARISON:  CT scan of the chest of April 15, 2015 and chest x-ray of April 14 2015. FINDINGS: There are confluent alveolar opacities in both lungs. There is relative sparing of the right upper lobe. There is a small right pleural effusion. The cardiac silhouette is enlarged. The pulmonary vascularity is not engorged. The mediastinum is normal in width. There is a small right pleural effusion. The observed bony thorax is unremarkable. IMPRESSION: Bilateral alveolar opacities worrisome for pneumonia or asymmetric pulmonary edema. There is a small right pleural effusion. Electronically Signed   By: David  Martinique M.D.   On: 07/09/2016 12:44    EKG: Independently reviewed. Normal sinus rhythm LVH.   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 Right-sided  pleuritic chest pain with cough concerning for pneumonia - I have discussed with pharmacist and since patient was admitted only for less than 24 hours last month patient will be treated as community acquired pneumonia at this time. Patient is placed on Levaquin and will try to minimize nephrotoxic drugs. Check urine Legionella and strep antigen and follow blood cultures. There was concern for possible CHF for which patient was given Lasix home dose which will be continued. Check d-dimer. 2. Chronic kidney disease stage V - patient's creatinine appears to be at his baseline when compared to last month in care everywhere. Continue Lasix. Closely follow intake output and daily weights. Continue bicarbonate. 3. 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 Check 2-D echo. Check daily weights and metabolic panel intake output. Last EF measured was in March 2016 was 60-65% with grade 1 diastolic dysfunction. 4. Hypertensive urgency - patient's blood pressure was elevated. Will continue home medications for which I have ordered 1 dose now his night dose. Continue Coreg and hydralazine clonidine and amlodipine. Closely follow blood pressure trends. When necessary IV hydralazine for blood pressure more than 562 systolic. 5. Chronic anemia - follow CBC. 6. Diabetes mellitus type 1 on Lantus insulin 20 units at bedtime along with sliding scale coverage. 7. Mildly elevated troponin - probably from #1 and 3 and 4. Cycle cardiac markers check 2-D echo. Will place patient on baby aspirin. 8. 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 No active discharge.   DVT prophylaxis: Heparin. Code Status: Full code.  Family Communication: Patient's wife.  Disposition Plan: Home.  Consults called: None.  Admission status: Inpatient. Telemetry. Likely stay 2-3 days.    Rise Patience MD [facility] Pager [phone].  If 7PM-7AM, please contact  night-coverage [email or web address] Password [email or web address]  07/09/2016, 11:49 PM

## 2016-07-09 NOTE — Progress Notes (Deleted)
Pharmacy Antibiotic Note  Dwayne HickmanBenjamin [name] is a 36 y.o. male admitted on 07/09/2016 with PNA .  Pharmacy has been consulted for Vancomycin and Cefepime  dosing.  Plan: Vancomycin 2000 mg IV now, then 1500  mg IV q48h  Change Cefepime 2 g IV q24h   Height: 6\' 2"  (188 cm) Weight: 244 lb 11.2 oz (110.995 kg) (scale a) IBW/kg (Calculated) : 82.2  Temp (24hrs), Avg:98.3 F (36.8 C), Min:97.9 F (36.6 C), Max:98.6 F (37 C)   Recent Labs Lab 07/09/16 1538 07/09/16 1743  WBC 8.2  --   CREATININE 5.86*  --   LATICACIDVEN  --  0.64    Estimated Creatinine Clearance: 23.1 mL/min (by C-G formula based on Cr of 5.86).    No Known Allergies    Dwayne [name], Dwayne [name] 07/09/2016 11:42 PM

## 2016-07-09 NOTE — ED Notes (Signed)
Pt. Stated, I just went to have a chest xray and they sent me here because I have pneumonia and I've been fatigue for 2 days.

## 2016-07-09 NOTE — ED Notes (Signed)
Initial i-stat lactic was collected but never ran in lab. Will recollect.

## 2016-07-09 NOTE — ED Provider Notes (Signed)
CSN: [id number]     Arrival date & time 07/09/16  1316 History   First MD Initiated Contact with Patient 07/09/16 1449     Chief Complaint  Patient presents with  . Pneumonia  . Fatigue     (Consider location/radiation/quality/duration/timing/severity/associated sxs/prior Treatment) [name] presenting with cough. The history is provided by the patient.  Cough Cough characteristics:  Productive Sputum characteristics:  Yellow and pink Severity:  Mild Onset quality:  Gradual Duration:  3 days Timing:  Constant Progression:  Worsening Chronicity:  New Smoker: no   Relieved by:  None tried Worsened by:  Nothing tried Ineffective treatments:  None tried Associated symptoms: chest pain, fever, myalgias and shortness of breath     Past Medical History  Diagnosis Date  . Hypertension   . Acute renal failure ([place])   . Bacteremia     MSSA   . Diabetes type 2, uncontrolled ([place]) 01/09/2015  . 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 Essential hypertension, benign 01/18/2014  . Infective myositis of left thigh   . Noncompliance 01/18/2014  . Septic shock ([place])   . Staphylococcus aureus bacteremia with sepsis ([place])   . Tobacco abuse 01/15/2014  . Mitral regurgitation 01/13/2015    Severe by TEE  . MSSA (methicillin susceptible Staphylococcus aureus)   . Type II diabetes mellitus (HCC)     not controlled  . Diabetic foot infection ([place]) 01/15/2014  . 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 I&d extremity Bilateral 01/16/2014    Procedure:  DEBRIDEMENT of bilateral foot ulcers;  Surgeon: [name], MD;  Location: [place];  Service: Orthopedics;  Laterality: Bilateral;  . Tee without cardioversion N/A 01/13/2015     Procedure: TRANSESOPHAGEAL ECHOCARDIOGRAM (TEE);  Surgeon: [name], MD;  Location: [facility] ENDOSCOPY;  Service: Cardiovascular;  Laterality: N/A;  . Left and right heart catheterization with coronary angiogram N/A 01/24/2015    Procedure: LEFT AND RIGHT HEART CATHETERIZATION WITH CORONARY ANGIOGRAM;  Surgeon: [name], MD;  Location: [facility] CATH LAB;  Service: Cardiovascular;  Laterality: N/A;  . Multiple extractions with alveoloplasty N/A 01/25/2015    Procedure: Extraction of tooth #'s 1,2,3,4,5,6,8,9,11,12,13,14,15,16,17,18,19 28,31,32 with alveoloplasty and gross debridement of remaining teeth.;  Surgeon: [name], DDS;  Location: [place];  Service: Oral Surgery;  Laterality: N/A;  . Cardiac catheterization  12/2014  . Shoulder arthroscopy Right 02/05/2015    Procedure: ARTHROSCOPY SHOULDER;  Surgeon: [name], MD;  Location: [place];  Service: Orthopedics;  Laterality: Right;  . Mitral valve replacement Right 02/18/2015    Procedure: MINIMALLY INVASIVE MITRAL VALVE (MV) REPAIR;  Surgeon: [name], MD;  Location: [place];  Service: Open Heart Surgery;  Laterality: Right;  . Tee without cardioversion N/A 02/18/2015    Procedure: TRANSESOPHAGEAL ECHOCARDIOGRAM (TEE);  Surgeon: [name], MD;  Location: [place];  Service: Open Heart Surgery;  Laterality: N/A;   Family History  Problem Relation Age of Onset  . Heart attack Neg Hx   . Stroke Maternal Uncle   . Hypertension Mother   . Diabetes Brother    Social History  Substance Use Topics  . Smoking status: Current Some Day Smoker -- 1.00 packs/day for 12 years    Types: Cigars  .  Smokeless tobacco: Never Used     Comment: black and mild cigars  . Alcohol Use: No    Review of Systems  Constitutional: Positive for fever.  Eyes: Negative for redness.  Respiratory: Positive for cough and shortness of breath.   Cardiovascular: Positive for chest pain and leg swelling.  Gastrointestinal: Negative for abdominal pain.   Musculoskeletal: Positive for myalgias. Negative for back pain.  All other systems reviewed and are negative.     Allergies  Review of patient's allergies indicates no known allergies.  Home Medications   Prior to Admission medications   Medication Sig Start Date End Date Taking? Authorizing Provider  acetaminophen-codeine (TYLENOL #4) 300-60 MG tablet Take 1 tablet by mouth every 4 (four) hours as needed for moderate pain. 05/10/16  Yes [name], MD  amLODipine (NORVASC) 10 MG tablet Take 10 mg by mouth.   Yes Historical Provider, MD  carvedilol (COREG) 3.125 MG tablet Take 3.125 mg by mouth 2 (two) times daily with a meal.   Yes Historical Provider, MD  cloNIDine (CATAPRES) 0.3 MG tablet Take 0.3 mg by mouth.   Yes Historical Provider, MD  cyclobenzaprine (FLEXERIL) 10 MG tablet Take 1 tablet (10 mg total) by mouth 2 (two) times daily as needed for muscle spasms. 09/15/15  Yes [name], MD  furosemide (LASIX) 80 MG tablet Take 40 mg by mouth 2 (two) times daily.  06/23/16  Yes Historical Provider, MD  hydrALAZINE (APRESOLINE) 50 MG tablet Take 50 mg by mouth.   Yes Historical Provider, MD  insulin aspart (NOVOLOG FLEXPEN) 100 UNIT/ML FlexPen Inject 6 Units into the skin 3 (three) times daily with meals. 03/16/16  Yes [name], MD  Insulin Glargine (LANTUS SOLOSTAR) 100 UNIT/ML Solostar Pen Inject 20 Units into the skin daily at 10 pm. 05/07/16  Yes [name], MD  metoCLOPramide (REGLAN) 10 MG tablet Take 10 mg by mouth.   Yes Historical Provider, MD  ondansetron (ZOFRAN) 4 MG tablet Take 1 tablet (4 mg total) by mouth every 8 (eight) hours as needed for nausea or vomiting. 05/10/16  Yes [name], MD  pantoprazole (PROTONIX) 40 MG tablet Take 1 tablet (40 mg total) by mouth daily. 03/22/16  Yes [name], MD  sodium bicarbonate 650 MG tablet Take 650 mg by mouth. 06/23/16  Yes Historical Provider, MD  Vitamin D, Ergocalciferol,  (DRISDOL) 50000 units CAPS capsule Take by mouth. 06/26/16  Yes Historical Provider, MD  [place] test strip USE TO TEST BLOOD SUGAR THREE TIMES A DAY 05/07/16   [name], MD  ACCU-CHEK SOFTCLIX LANCETS lancets USE TO TEST BLOOD SUGAR THREE TIMES A DAY 05/07/16   [name], MD  Blood Pressure Monitoring (ADULT BLOOD PRESSURE CUFF LG) KIT Measure BP 2x daily 01/16/16   Olugbemiga E Doreene Burke, MD  EASY TOUCH PEN NEEDLES 31G X 8 MM MISC USE TO INJECT INSULIN (LANTUS) AS DIRECTED 05/07/16   [name], MD  Insulin Pen Needle (B-D UF III MINI PEN NEEDLES) 31G X 5 MM MISC Use one needle with each injection of Lantus Pen daily. 12/10/15   [name], MD  Insulin Syringe-Needle U-100 28G X 1/2" 0.5 ML MISC Use as directed. Any generic available. 12/05/15   [name], MD  PARoxetine (PAXIL) 20 MG tablet Take 1 tablet (20 mg total) by mouth daily. 03/22/16   [name], MD  potassium chloride SA (K-DUR,KLOR-CON) 20 MEQ tablet Take 2  tablets daily 09/15/15   [name], MD  silver sulfADIAZINE (SILVADENE) 1 % cream Apply 1 application topically 2 (two) times daily. 01/05/16   Gardiner Barefoot, DPM  UNABLE TO FIND Reported on 01/16/2016 12/19/15   Historical Provider, MD   BP 174/101 mmHg  Pulse 91  Temp(Src) 97.9 F (36.6 C) (Oral)  Resp 18  Ht 6' 2"  (1.88 m)  Wt 244 lb 11.2 oz (110.995 kg)  BMI 31.40 kg/m2  SpO2 98% Physical Exam  Constitutional: He is oriented to person, place, and time. He appears well-developed and well-nourished.  HENT:  Head: Normocephalic and atraumatic.  Neck: Normal range of motion.  Cardiovascular: Normal rate.   Pulmonary/Chest: Effort normal. No respiratory distress.  Abdominal: Soft. He exhibits no distension. There is no tenderness.  Musculoskeletal: Normal range of motion. He exhibits no edema or tenderness.  Neurological: He is alert and oriented to person, place, and time.  Skin: Skin is warm and dry.   Nursing note and vitals reviewed.   ED Course  Procedures (including critical care time) Labs Review Labs Reviewed  CBC WITH DIFFERENTIAL/PLATELET - Abnormal; Notable for the following:    RBC 3.40 (*)    Hemoglobin 9.5 (*)    HCT 29.1 (*)    All other components within normal limits  COMPREHENSIVE METABOLIC PANEL - Abnormal; Notable for the following:    CO2 20 (*)    Glucose, Bld 141 (*)    BUN 59 (*)    Creatinine, Ser 5.86 (*)    Albumin 3.0 (*)    GFR calc non Af Amer 11 (*)    GFR calc Af Amer 13 (*)    All other components within normal limits  BRAIN NATRIURETIC PEPTIDE - Abnormal; Notable for the following:    B Natriuretic Peptide 378.4 (*)    All other components within normal limits  TROPONIN I - Abnormal; Notable for the following:    Troponin I 0.11 (*)    All other components within normal limits  GLUCOSE, CAPILLARY - Abnormal; Notable for the following:    Glucose-Capillary 178 (*)    All other components within normal limits  CULTURE, EXPECTORATED SPUTUM-ASSESSMENT  GRAM STAIN  TROPONIN I  TROPONIN I  TROPONIN I  D-DIMER, QUANTITATIVE (NOT AT [facility])  HIV ANTIBODY (ROUTINE TESTING)  STREP PNEUMONIAE URINARY ANTIGEN  PROCALCITONIN  LEGIONELLA PNEUMOPHILA SEROGP 1 UR AG  CBC  CREATININE, SERUM  COMPREHENSIVE METABOLIC PANEL  CBC WITH DIFFERENTIAL/PLATELET  I-STAT CG4 LACTIC ACID, ED  I-STAT CG4 LACTIC ACID, ED    Imaging Review Dg Chest 2 View  07/09/2016  CLINICAL DATA:  Cough, shortness of breath, chest pain, former smoker; history of diabetes valvular heart disease, CHF, pericarditis. EXAM: CHEST  2 VIEW COMPARISON:  CT scan of the chest of April 15, 2015 and chest x-ray of April 14 2015. FINDINGS: There are confluent alveolar opacities in both lungs. There is relative sparing of the right upper lobe. There is a small right pleural effusion. The cardiac silhouette is enlarged. The pulmonary vascularity is not engorged. The mediastinum is normal in  width. There is a small right pleural effusion. The observed bony thorax is unremarkable. IMPRESSION: Bilateral alveolar opacities worrisome for pneumonia or asymmetric pulmonary edema. There is a small right pleural effusion. Electronically Signed   By: David  Martinique M.D.   On: 07/09/2016 12:44   I have personally reviewed and evaluated these images and lab results as part of my medical decision-making.   EKG  Interpretation   Date/Time:  Monday July 09 2016 15:42:06 EDT Ventricular Rate:  84 PR Interval:    QRS Duration: 85 QT Interval:  396 QTC Calculation: 469 R Axis:   -2 Text Interpretation:  Sinus rhythm Left ventricular hypertrophy Anterior  infarct, old Confirmed by Carlesha Seiple MD, Corene Cornea [phone]) on 07/09/2016 4:34:12 PM      MDM   Final diagnoses:  Cough  Chest pain   25 -year-old male here with a couple days of cough, fatigue. Went to see a GI doctor today who ordered chest x-ray that showed pneumonia versus pulmonary edema. Sent here for evaluation. Here he is slightly hypoxic at 90% normal vital signs otherwise. Lungs have crackles bilaterally. Some lower show any edema. Plan to check labs to help differentiate between pneumonia versus CHF exacerbation and treated appropriately. Labs and exam consistent with CHF. Initially plan to admit the patient here however family wanted the patient admitted to somewhere in [place]. I called both [facility] and for site hospitals and they're both willing to accept the patient however the wait would be likely longer than 24-36 hours and possibly longer. I rediscussed this with the patient and he is now willing to be admitted here at [facility]      Merrily Pew, MD 07/09/16 [phone]

## 2016-07-09 NOTE — ED Notes (Addendum)
Pt speaking to a "family member" on the phone, which is on speaker phone. RN called to room with family member making rude statements prior to my arrival to the room that could be overheard from the hallway as well as cursing at this RN. Family member spoke to this nurse using derogatory language. Family member stated "I don't give a shi*t as long as someone brings him some blankets." I informed her that pt had received a warm blanket and had not requested a second one. This RN informed the family member that I would not speak to her on the phone any longer due to her behavior.

## 2016-07-10 ENCOUNTER — Inpatient Hospital Stay (HOSPITAL_COMMUNITY): Payer: Medicaid Other

## 2016-07-10 ENCOUNTER — Encounter (HOSPITAL_COMMUNITY): Payer: Self-pay | Admitting: Radiology

## 2016-07-10 DIAGNOSIS — J189 Pneumonia, unspecified organism: Secondary | ICD-10-CM

## 2016-07-10 DIAGNOSIS — I5033 Acute on chronic diastolic (congestive) heart failure: Secondary | ICD-10-CM

## 2016-07-10 DIAGNOSIS — R7989 Other specified abnormal findings of blood chemistry: Secondary | ICD-10-CM

## 2016-07-10 DIAGNOSIS — D649 Anemia, unspecified: Secondary | ICD-10-CM

## 2016-07-10 DIAGNOSIS — R0600 Dyspnea, unspecified: Secondary | ICD-10-CM

## 2016-07-10 DIAGNOSIS — R05 Cough: Secondary | ICD-10-CM

## 2016-07-10 DIAGNOSIS — I160 Hypertensive urgency: Secondary | ICD-10-CM

## 2016-07-10 LAB — ECHOCARDIOGRAM COMPLETE
Height: 74 in
WEIGHTICAEL: 3915.2 [oz_av]

## 2016-07-10 LAB — HIV ANTIBODY (ROUTINE TESTING W REFLEX): HIV Screen 4th Generation wRfx: NONREACTIVE

## 2016-07-10 LAB — COMPREHENSIVE METABOLIC PANEL
ALT: 33 U/L (ref 17–63)
AST: 16 U/L (ref 15–41)
Albumin: 2.8 g/dL — ABNORMAL LOW (ref 3.5–5.0)
Alkaline Phosphatase: 90 U/L (ref 38–126)
Anion gap: 10 (ref 5–15)
BUN: 59 mg/dL — ABNORMAL HIGH (ref 6–20)
CO2: 22 mmol/L (ref 22–32)
Calcium: 9 mg/dL (ref 8.9–10.3)
Chloride: 108 mmol/L (ref 101–111)
Creatinine, Ser: 5.52 mg/dL — ABNORMAL HIGH (ref 0.61–1.24)
GFR calc Af Amer: 14 mL/min — ABNORMAL LOW (ref >60)
GFR calc non Af Amer: 12 mL/min — ABNORMAL LOW (ref >60)
Glucose, Bld: 116 mg/dL — ABNORMAL HIGH (ref 65–99)
Potassium: 4 mmol/L (ref 3.5–5.1)
Sodium: 140 mmol/L (ref 135–145)
Total Bilirubin: 0.5 mg/dL (ref 0.3–1.2)
Total Protein: 6.8 g/dL (ref 6.5–8.1)

## 2016-07-10 LAB — CREATININE, SERUM
Creatinine, Ser: 5.61 mg/dL — ABNORMAL HIGH (ref 0.61–1.24)
GFR calc Af Amer: 14 mL/min — ABNORMAL LOW (ref >60)
GFR calc non Af Amer: 12 mL/min — ABNORMAL LOW (ref >60)

## 2016-07-10 LAB — DIFFERENTIAL
Basophils Absolute: 0 10*3/uL (ref 0.0–0.1)
Basophils Relative: 1 %
EOS ABS: 0.7 10*3/uL (ref 0.0–0.7)
EOS PCT: 11 %
LYMPHS ABS: 0.7 10*3/uL (ref 0.7–4.0)
LYMPHS PCT: 11 %
MONO ABS: 0.7 10*3/uL (ref 0.1–1.0)
Monocytes Relative: 11 %
NEUTROS PCT: 66 %
Neutro Abs: 4.3 10*3/uL (ref 1.7–7.7)

## 2016-07-10 LAB — CBC
HEMATOCRIT: 27 % — AB (ref 39.0–52.0)
HEMOGLOBIN: 8.8 g/dL — AB (ref 13.0–17.0)
MCH: 28 pg (ref 26.0–34.0)
MCHC: 32.6 g/dL (ref 30.0–36.0)
MCV: 86 fL (ref 78.0–100.0)
PLATELETS: 254 10*3/uL (ref 150–400)
RBC: 3.14 MIL/uL — ABNORMAL LOW (ref 4.22–5.81)
RDW: 13.8 % (ref 11.5–15.5)
WBC: 6.4 10*3/uL (ref 4.0–10.5)

## 2016-07-10 LAB — GLUCOSE, CAPILLARY
Glucose-Capillary: 102 mg/dL — ABNORMAL HIGH (ref 65–99)
Glucose-Capillary: 153 mg/dL — ABNORMAL HIGH (ref 65–99)
Glucose-Capillary: 77 mg/dL (ref 65–99)

## 2016-07-10 LAB — PROCALCITONIN: PROCALCITONIN: 0.44 ng/mL

## 2016-07-10 LAB — TROPONIN I: Troponin I: 0.03 ng/mL (ref <0.03)

## 2016-07-10 LAB — D-DIMER, QUANTITATIVE: D-Dimer, Quant: 2.77 ug/mL-FEU — ABNORMAL HIGH (ref 0.00–0.50)

## 2016-07-10 MED ORDER — DEXTROSE 5 % IV SOLN
120.0000 mg | Freq: Two times a day (BID) | INTRAVENOUS | Status: DC
  Administered 2016-07-10: 120 mg via INTRAVENOUS
  Filled 2016-07-10 (×2): qty 12

## 2016-07-10 MED ORDER — TECHNETIUM TO 99M ALBUMIN AGGREGATED
4.0000 | Freq: Once | INTRAVENOUS | Status: AC | PRN
  Administered 2016-07-10: 4 via INTRAVENOUS

## 2016-07-10 MED ORDER — TECHNETIUM TC 99M DIETHYLENETRIAME-PENTAACETIC ACID: 32.0000 | Freq: Once | INTRAVENOUS | Status: DC | PRN

## 2016-07-10 MED ORDER — DEXTROSE 50 % IV SOLN: 25.0000 mL | Freq: Once | INTRAVENOUS | Status: DC

## 2016-07-10 MED ORDER — ASPIRIN EC 81 MG PO TBEC
81.0000 mg | DELAYED_RELEASE_TABLET | Freq: Every day | ORAL | Status: DC
  Administered 2016-07-10: 81 mg via ORAL
  Filled 2016-07-10: qty 1

## 2016-07-10 NOTE — Consult Note (Signed)
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  The patient is followed by nephrology at [facility], most recently seen for his initial consult on 06/21/16 where he saw Dr. Gwynneth Macleod. Peritoneal and regular dialysis options were discussed with him on that visit with plans to start the work-up for transplantation in the future. He had a 23 hour observation on 6/23 due to hyperkalemia of 6.2, was treated and discharged home with nephrology following closing. His creatinine since May 2017 4.3 with GFR of 19, with creatinine ranging around 5.5 in June.   On admission, he was slightly hypoxic at 90% room air, with rales, and was hypertensive 174/101. Labs showed a creatinine was 5.86, BUN 59, K 4.1, Na 140, glucose 141, BNP 378.4, troponin 0.11 -> 0.03, lactic acid 0.64, procalcitonin 0.44, WBC 6.4, Hgb 8.8, Hct 27.0, d-dimer 2.77. Chest xray showed bilateral alveolar opacities worrisome for pneumonia or asymmetric pulmonary edema; small right pleural effusion. Originally he was going to be transferred to [facility] however there were no beds. CT of the chest showed diffuse airspace disease with small right pleural effusion which could represent pneumonia or edema. TTE 7/11 showed LVEF 60-65%, normal wall function, grade 2 diastolic dysfunction. He was  treated with lasix 45m on 7/10, treated with one dose of Levaquin. Since his urine output is minimal 600 -> 400, and creatinine today of 5.52. He has continued to complain of shortness of breath, orthopnea and pink tinged sputum. Remains afebrile.   Nephrology consulted reguarding concern over pulmonary edema related to hypervolemia. Upon  My exam patient is alone and groggy.  Answers questsions.  Says breathing is stable but obvious mild inc WOB/RR.     CREAT (mg/dL)  Date Value  05/10/2016 4.31*  10/17/2015 3.44*  05/23/2015 1.84*  05/12/2015 2.22*  04/06/2015 1.70*  03/16/2014 1.24   CREATININE, SER (mg/dL)  Date Value  07/10/2016 5.61*  07/10/2016 5.52*  07/09/2016 5.86*  12/02/2015 3.40*  04/29/2015 2.02*  04/14/2015 1.87*  03/31/2015 1.72*  03/17/2015 2.07*  02/25/2015 1.44*  02/24/2015 1.57*  ] I/Os:  ROS Balance of 12 systems is negative w/ exceptions as above  PMH  Past Medical History  Diagnosis Date  . Hypertension   . Acute renal failure ([place]   . Bacteremia     MSSA   . Diabetes type 2, uncontrolled ([place] 01/09/2015  . 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 Essential hypertension, benign 01/18/2014  . Infective myositis of left thigh   . Noncompliance 01/18/2014  . Septic shock ([place]   . Staphylococcus aureus bacteremia with sepsis ([place]   . Tobacco abuse 01/15/2014  . Mitral regurgitation 01/13/2015    Severe by TEE  . MSSA (methicillin susceptible Staphylococcus aureus)   . Type II diabetes mellitus (HCC)     not controlled  . Diabetic foot infection ([place] 01/15/2014  . 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 I&d extremity Bilateral 01/16/2014    Procedure:  DEBRIDEMENT of bilateral foot  ulcers;  Surgeon: [name], MD;  Location: [place];  Service: Orthopedics;  Laterality: Bilateral;  . Tee without cardioversion N/A 01/13/2015    Procedure: TRANSESOPHAGEAL ECHOCARDIOGRAM (TEE);  Surgeon: [name], MD;  Location: [facility] ENDOSCOPY;  Service: Cardiovascular;  Laterality: N/A;  . Left and right heart catheterization with coronary angiogram N/A 01/24/2015    Procedure: LEFT AND RIGHT HEART CATHETERIZATION WITH CORONARY ANGIOGRAM;  Surgeon: [name], MD;  Location: [facility] CATH LAB;  Service: Cardiovascular;  Laterality: N/A;  . Multiple extractions with alveoloplasty N/A 01/25/2015    Procedure: Extraction of tooth #'s 1,2,3,4,5,6,8,9,11,12,13,14,15,16,17,18,19 28,31,32 with alveoloplasty and gross debridement of remaining teeth.;  Surgeon: [name], DDS;  Location: [place];  Service: Oral Surgery;  Laterality: N/A;  . Cardiac catheterization  12/2014  . Shoulder arthroscopy Right 02/05/2015    Procedure: ARTHROSCOPY SHOULDER;  Surgeon: [name], MD;  Location: [place];  Service: Orthopedics;  Laterality: Right;  . Mitral valve replacement Right 02/18/2015    Procedure: MINIMALLY INVASIVE MITRAL VALVE (MV) REPAIR;  Surgeon: [name], MD;  Location: [place];  Service: Open Heart Surgery;  Laterality: Right;  . Tee without cardioversion N/A 02/18/2015    Procedure: TRANSESOPHAGEAL ECHOCARDIOGRAM (TEE);  Surgeon: [name], MD;  Location: [place];  Service: Open Heart Surgery;  Laterality: N/A;   FH  Family History  Problem Relation Age of Onset  . Heart attack Neg Hx   . Stroke Maternal Uncle   . Hypertension Mother   . Diabetes Brother    SH  reports that he has been smoking Cigars.  He has never used smokeless tobacco. He reports that he does not drink alcohol or use illicit drugs. Allergies No Known Allergies Home medications Prior to Admission medications   Medication Sig Start Date End Date Taking? Authorizing Provider  acetaminophen-codeine  (TYLENOL #4) 300-60 MG tablet Take 1 tablet by mouth every 4 (four) hours as needed for moderate pain. 05/10/16  Yes [name], MD  amLODipine (NORVASC) 10 MG tablet Take 10 mg by mouth.   Yes Historical Provider, MD  carvedilol (COREG) 3.125 MG tablet Take 3.125 mg by mouth 2 (two) times daily with a meal.   Yes Historical Provider, MD  cloNIDine (CATAPRES) 0.3 MG tablet Take 0.3 mg by mouth.   Yes Historical Provider, MD  cyclobenzaprine (FLEXERIL) 10 MG tablet Take 1 tablet (10 mg total) by mouth 2 (two) times daily as needed for muscle spasms. 09/15/15  Yes [name], MD  furosemide (LASIX) 80 MG tablet Take 40 mg by mouth 2 (two) times daily.  06/23/16  Yes Historical Provider, MD  hydrALAZINE (APRESOLINE) 50 MG tablet Take 50 mg by mouth.   Yes Historical Provider, MD  insulin aspart (NOVOLOG FLEXPEN) 100 UNIT/ML FlexPen Inject 6 Units into the skin 3 (three) times daily with meals. 03/16/16  Yes [name], MD  Insulin Glargine (LANTUS SOLOSTAR) 100 UNIT/ML Solostar Pen Inject 20 Units into the skin daily at 10 pm. 05/07/16  Yes [name], MD  metoCLOPramide (REGLAN) 10 MG tablet Take 10 mg by mouth.   Yes Historical Provider, MD  ondansetron [facility]) 4  MG tablet Take 1 tablet (4 mg total) by mouth every 8 (eight) hours as needed for nausea or vomiting. 05/10/16  Yes [name], MD  pantoprazole (PROTONIX) 40 MG tablet Take 1 tablet (40 mg total) by mouth daily. 03/22/16  Yes [name], MD  sodium bicarbonate 650 MG tablet Take 650 mg by mouth. 06/23/16  Yes Historical Provider, MD  Vitamin D, Ergocalciferol, (DRISDOL) 50000 units CAPS capsule Take by mouth. 06/26/16  Yes Historical Provider, MD  [place] test strip USE TO TEST BLOOD SUGAR THREE TIMES A DAY 05/07/16   [name], MD  ACCU-CHEK SOFTCLIX LANCETS lancets USE TO TEST BLOOD SUGAR THREE TIMES A DAY 05/07/16   [name], MD  Blood Pressure Monitoring (ADULT  BLOOD PRESSURE CUFF LG) KIT Measure BP 2x daily 01/16/16   Olugbemiga E Doreene Burke, MD  EASY TOUCH PEN NEEDLES 31G X 8 MM MISC USE TO INJECT INSULIN (LANTUS) AS DIRECTED 05/07/16   [name], MD  Insulin Pen Needle (B-D UF III MINI PEN NEEDLES) 31G X 5 MM MISC Use one needle with each injection of Lantus Pen daily. 12/10/15   [name], MD  Insulin Syringe-Needle U-100 28G X 1/2" 0.5 ML MISC Use as directed. Any generic available. 12/05/15   [name], MD  PARoxetine (PAXIL) 20 MG tablet Take 1 tablet (20 mg total) by mouth daily. 03/22/16   [name], MD  potassium chloride SA (K-DUR,KLOR-CON) 20 MEQ tablet Take 2 tablets daily 09/15/15   [name], MD  silver sulfADIAZINE (SILVADENE) 1 % cream Apply 1 application topically 2 (two) times daily. 01/05/16   Gardiner Barefoot, DPM  UNABLE TO FIND Reported on 01/16/2016 12/19/15   Historical Provider, MD    Current Medications Scheduled Meds: . amLODipine  10 mg Oral QHS  . aspirin EC  81 mg Oral Daily  . carvedilol  3.125 mg Oral BID WC  . cloNIDine  0.3 mg Oral TID  . furosemide  80 mg Oral QPM  . heparin  5,000 Units Subcutaneous Q8H  . hydrALAZINE  50 mg Oral TID  . insulin aspart  0-9 Units Subcutaneous TID WC  . insulin aspart  6 Units Subcutaneous TID WC  . insulin glargine  20 Units Subcutaneous Q2200  . [START ON 07/11/2016] levofloxacin (LEVAQUIN) IV  750 mg Intravenous Q48H  . metoCLOPramide  10 mg Oral TID AC  . pantoprazole  40 mg Oral Daily  . sodium bicarbonate  650 mg Oral BID  . sodium chloride flush  3 mL Intravenous Q12H  . [START ON 07/13/2016] Vitamin D (Ergocalciferol)  50,000 Units Oral Q Fri   Continuous Infusions:  PRN Meds:.acetaminophen **OR** acetaminophen, acetaminophen-codeine, cyclobenzaprine, ondansetron **OR** ondansetron (ZOFRAN) IV, technetium TC 85M diethylenetriame-pentaacetic acid  CBC  Recent Labs Lab 07/09/16 1538 07/10/16 0555  WBC 8.2 6.4  NEUTROABS 5.7 4.3   HGB 9.5* 8.8*  HCT 29.1* 27.0*  MCV 85.6 86.0  PLT 265 106   Basic Metabolic Panel  Recent Labs Lab 07/09/16 1538 07/10/16 0555  NA 140 140  K 4.1 4.0  CL 110 108  CO2 20* 22  GLUCOSE 141* 116*  BUN 59* 59*  CREATININE 5.86* 5.61*  5.52*  CALCIUM 9.0 9.0    Physical Exam  Blood pressure 165/88, pulse 81, temperature 98.1 F (36.7 C), temperature source Oral, resp. rate 20, height 6' 2"  (1.88 m), weight 110.995 kg (244 lb 11.2 oz), SpO2 93 %. 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   1. CKD5 2. SOB/tachypnea/hypoxia with pulm infiltrates 3. DM1 with DPN, DR, 4. Tobacco user 5. HTN 6. 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 I suspect strong component of pulmonary edema, would inc lasix freq/dose  -- suggest 130m IV BID 2. VQ pending, doubt positive 3. Hold NaHCO3 given edema/hypervolemia 4. ABX per TRH 5. Daily weights, Daily Renal Panel, Strict I/Os, Avoid nephrotoxins (NSAIDs, judicious IV Contrast)    RPearson GrippeMD [phone] 07/10/2016, 4:48 PM

## 2016-07-10 NOTE — Progress Notes (Signed)
Pt a/o, signed out AMA, paper on chart

## 2016-07-10 NOTE — Progress Notes (Signed)
PROGRESS NOTE  Dwayne HickmanBenjamin [name]  ZOX:[id number] DOB: 12/11/1980 DOA: 07/09/2016 PCP: Jeanann LewandowskyJEGEDE, OLUGBEMIGA, MD Outpatient Specialists:  Subjective: Seen with wife at bedside, complains about pinkish sputum, orthopnea and SOB. He denies any fever, chills or purulent sputum.  Brief Narrative:  Dwayne HickmanBenjamin Curlin is a 36 y.o. 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 Patient has been having these symptoms since yesterday. Also had 2 episodes of vomiting this morning but denies any abdominal pain. Chest x-ray shows congestion versus pneumonia. On my exam patient is still having cough. Patient was admitted last month for 23 hours observation for hyperkalemia which improved with Kayexalate at [facility]. Patient is being [facility] by [facility]. Initial [facility] but there was no bed available. Patient is presently chest pain free. Pain happened last night when coughing. More of pleuritic type. He also was given his home dose of Lasix in the ER. 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  ED Course: Levaquin was given for possible pneumonia along with Lasix 80 mg.  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 -The CT scan without contrast showed atypical pneumonia versus atypical pulmonary edema. -Has positive d-dimer's of 2.77, will  obtain VQ scan.  ? Pneumonia -Patient did not have fever, chills, purulent sputum or leukocytosis. -Has positive procalcitonin 0.44, started on levofloxacin. CXR showed possible pneumonia. -I will discontinue antibiotics and watch for fever spike or increase in leukocytosis to institute to antibiotics.  Acute pulmonary edema -This is likely acute pulmonary edema, noncardiogenic, secondary to stage V chronic kidney disease. -Nephrology consulted.  Chronic kidney disease stage V  -Patient's creatinine appears to be at his baseline when compared to last month in care everywhere.  -Appears to be fluid overloaded, nephrology consulted for further evaluation.  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 Check 2-D echo to evaluate mitral valve.  -Check daily weights, Last EF measured was in March 2016 was 60-65% with grade 1 diastolic dysfunction.  Accelerated hypertension -Patient's blood pressure was elevated up to 174/101.  -Home medications restarted, this is likely related to volume, diuresis started as well.  Chronic anemia secondary to CKD stage V- follow CBC.  Diabetes mellitus type 1 on Lantus insulin 20 units at bedtime along with sliding scale coverage.  Mildly elevated troponin - probably from #1 and 3 and 4. Cycle cardiac markers check 2-D echo. Will place patient on baby aspirin.  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 No active discharge.  Social issues -I spoke to the wife at bedside in the morning, she asked me [facility]. -I called [facility] asking for transfer, nephrologist initially accepted, but the hospitalist declined because of no escalation of acuity of care they can provided in [facility] plus they don't have beds. -When I informed the wife about it she was not accepting,  said "it's not like you guys know what you doing". -Patient's already refused to blood tests, wife requested all blood  test not to be done unless she is at bedside.   DVT prophylaxis:  Code Status: Full Code Family Communication:  Disposition Plan:  Diet: Diet renal/carb modified with fluid restriction Diet-HS Snack?: Nothing; Room service appropriate?: Yes; Fluid consistency:: Thin  Consultants:   Nephrology  Procedures:   None  Antimicrobials:   Levaquin  Objective: Filed Vitals:   07/09/16 2254 07/10/16 0431 07/10/16 0921 07/10/16 1205  BP: 174/101 154/85 161/90 165/88  Pulse: 91 88 88 81  Temp: 97.9 F (36.6 C) 98.1 F (36.7 C)  98.1 F (36.7 C)  TempSrc: Oral Oral  Oral  Resp: Height:  (1.88 m)     Weight: 110.995 kg (244 lb 11.2 oz)     SpO2: 98% 94% 94% 93%    Intake/Output Summary (Last 24 hours) at 07/10/16 1404 Last data filed at 07/10/16 1400  Gross per 24 hour  Intake    390 ml  Output   1000 ml  Net   -610 ml   Filed Weights   07/09/16 1417 07/09/16 2254  Weight: 112.038 kg (247 lb) 110.995 kg (244 lb 11.2 oz)    Examination: General exam: Appears calm and comfortable  Respiratory system: Clear to auscultation. Respiratory effort normal. Cardiovascular system: S1 & S2 heard, RRR. No JVD, murmurs, rubs, gallops or clicks. No pedal edema. Gastrointestinal system: Abdomen is nondistended, soft and nontender. No organomegaly or masses felt. Normal bowel sounds heard. Central nervous system: Alert and oriented. No focal neurological deficits. Extremities: Symmetric 5 x 5 power. Skin: No rashes, lesions or ulcers Psychiatry: Judgement and insight appear normal. Mood & affect appropriate.   Data Reviewed: I have personally reviewed following labs and imaging studies  CBC:  Recent Labs Lab 07/09/16 1538 07/10/16 0555  WBC 8.2 6.4  NEUTROABS 5.7 4.3  HGB 9.5* 8.8*  HCT 29.1* 27.0*  MCV 85.6 86.0  PLT 265 254   Basic Metabolic Panel:  Recent Labs Lab 07/09/16 1538 07/10/16 0555  NA 140 140  K 4.1 4.0  CL 110 108  CO2 20* 22    GLUCOSE 141* 116*  BUN 59* 59*  CREATININE 5.86* 5.61*  5.52*  CALCIUM 9.0 9.0   GFR: Estimated Creatinine Clearance: 24.1 mL/min (by C-G formula based on Cr of 5.61). Liver Function Tests:  Recent Labs Lab 07/09/16 1538 07/10/16 0555  AST 19 16  ALT 38 33  ALKPHOS 97 90  BILITOT 0.5 0.5  PROT 6.8 6.8  ALBUMIN 3.0* 2.8*   No results for input(s): LIPASE, AMYLASE in the last 168 hours. No results for input(s): AMMONIA in the last 168 hours. Coagulation Profile: No results for input(s): INR, PROTIME in the last 168 hours. Cardiac Enzymes:  Recent Labs Lab 07/09/16 1538 07/10/16 0555  TROPONINI 0.11* 0.03*   BNP (last 3 results) No results for input(s): PROBNP in the last 8760 hours. HbA1C: No results for input(s): HGBA1C in the last 72 hours. CBG:  Recent Labs Lab 07/09/16 2259 07/10/16 0613 07/10/16 1129  GLUCAP 178* 102* 153*   Lipid Profile: No results for input(s): CHOL, HDL, LDLCALC, TRIG, CHOLHDL, LDLDIRECT in the last 72 hours. Thyroid Function Tests: No results for input(s): TSH, T4TOTAL, FREET4, T3FREE, THYROIDAB in the last 72 hours. Anemia Panel: No results for input(s): VITAMINB12, FOLATE, FERRITIN, TIBC, IRON, RETICCTPCT in the last 72 hours. Urine analysis:  Component Value Date/Time   COLORURINE YELLOW 02/18/2015 0423   APPEARANCEUR CLEAR 02/18/2015 0423   LABSPEC 1.013 02/18/2015 0423   PHURINE 7.5 02/18/2015 0423   GLUCOSEU 100* 02/18/2015 0423   HGBUR NEGATIVE 02/18/2015 0423   BILIRUBINUR neg 08/18/2015 1129   BILIRUBINUR NEGATIVE 02/18/2015 0423   KETONESUR NEGATIVE 02/18/2015 0423   PROTEINUR >=300 08/18/2015 1129   PROTEINUR 100* 02/18/2015 0423   UROBILINOGEN 0.2 08/18/2015 1129   UROBILINOGEN 0.2 02/18/2015 0423   NITRITE neg 08/18/2015 1129   NITRITE NEGATIVE 02/18/2015 0423   LEUKOCYTESUR Negative 08/18/2015 1129   Sepsis Labs: @LABRCNTIP (procalcitonin:4,lacticidven:4)  )No results found for this or any previous  visit (from the past 240 hour(s)).   Invalid input(s): PROCALCITONIN, LACTICACIDVEN   Radiology Studies: Dg Chest 2 View  07/09/2016  CLINICAL DATA:  Cough, shortness of breath, chest pain, former smoker; history of diabetes valvular heart disease, CHF, pericarditis. EXAM: CHEST  2 VIEW COMPARISON:  CT scan of the chest of April 15, 2015 and chest x-ray of April 14 2015. FINDINGS: There are confluent alveolar opacities in both lungs. There is relative sparing of the right upper lobe. There is a small right pleural effusion. The cardiac silhouette is enlarged. The pulmonary vascularity is not engorged. The mediastinum is normal in width. There is a small right pleural effusion. The observed bony thorax is unremarkable. IMPRESSION: Bilateral alveolar opacities worrisome for pneumonia or asymmetric pulmonary edema. There is a small right pleural effusion. Electronically Signed   By: David  [place] M.D.   On: 07/09/2016 12:44   Ct Chest Wo Contrast  07/10/2016  CLINICAL DATA:  Cough and chest pain. Fatigue for 2 days. Pneumonia. EXAM: CT CHEST WITHOUT CONTRAST TECHNIQUE: Multidetector CT imaging of the chest was performed following the standard protocol without IV contrast. COMPARISON:  Chest radiographs 07/09/2016.  CT chest 04/15/2015. FINDINGS: Cardiovascular: Limit evaluation on noncontrast imaging. No aortic aneurysm. Aortic calcification. Mitral valve replacement. Normal heart size. Mediastinum/Nodes: Esophagus is decompressed. Moderately prominent mediastinal lymph nodes, measuring up to about 9 mm short axis dimension. These are likely reactive. Lungs/Pleura: Diffuse airspace infiltration throughout both lungs with mostly perihilar distribution. Small right pleural effusion. Changes likely represent multifocal pneumonia although edema could also have this appearance. Upper Abdomen: Visualized organs demonstrate no significant abnormality. Musculoskeletal: No destructive bone lesions. IMPRESSION: Diffuse  airspace disease in both lungs with small right pleural effusion. Changes likely represent pneumonia although edema could also have this appearance. Electronically Signed   By: Burman Nieves M.D.   On: 07/10/2016 02:28    Scheduled Meds: . amLODipine  10 mg Oral QHS  . aspirin EC  81 mg Oral Daily  . carvedilol  3.125 mg Oral BID WC  . cloNIDine  0.3 mg Oral TID  . furosemide  80 mg Oral QPM  . heparin  5,000 Units Subcutaneous Q8H  . hydrALAZINE  50 mg Oral TID  . insulin aspart  0-9 Units Subcutaneous TID WC  . insulin aspart  6 Units Subcutaneous TID WC  . insulin glargine  20 Units Subcutaneous Q2200  . [START ON 07/11/2016] levofloxacin (LEVAQUIN) IV  750 mg Intravenous Q48H  . metoCLOPramide  10 mg Oral TID AC  . pantoprazole  40 mg Oral Daily  . sodium bicarbonate  650 mg Oral BID  . sodium chloride flush  3 mL Intravenous Q12H  . [START ON 07/13/2016] Vitamin D (Ergocalciferol)  50,000 Units Oral Q Fri   Continuous Infusions:    LOS: 1 day  Time spent: 35 minutes    Loreen Bankson A, MD [facility] Pager [phone]  If 7PM-7AM, please contact night-coverage [email or web address] Password [email or web address] 07/10/2016, 2:04 PM

## 2016-07-10 NOTE — Progress Notes (Signed)
Patient refusing further lab draws. MD aware. Will continue to monitor.

## 2016-07-10 NOTE — Progress Notes (Signed)
  Echocardiogram 2D Echocardiogram has been performed.  Yoanna Jurczyk 07/10/2016, 11:25 AM

## 2016-07-15 LAB — CULTURE, BLOOD (ROUTINE X 2): Culture: NO GROWTH

## 2016-07-18 ENCOUNTER — Ambulatory Visit: Payer: Medicaid Other | Admitting: Podiatry

## 2016-08-07 ENCOUNTER — Other Ambulatory Visit: Payer: Self-pay | Admitting: Internal Medicine

## 2016-08-09 ENCOUNTER — Encounter: Payer: Self-pay | Admitting: Podiatry

## 2016-08-09 ENCOUNTER — Ambulatory Visit (INDEPENDENT_AMBULATORY_CARE_PROVIDER_SITE_OTHER): Payer: Medicaid Other | Admitting: Podiatry

## 2016-08-09 VITALS — BP 150/94 | HR 74 | Resp 14

## 2016-08-09 DIAGNOSIS — L97509 Non-pressure chronic ulcer of other part of unspecified foot with unspecified severity: Secondary | ICD-10-CM

## 2016-08-09 DIAGNOSIS — E10621 Type 1 diabetes mellitus with foot ulcer: Principal | ICD-10-CM

## 2016-08-09 DIAGNOSIS — L97501 Non-pressure chronic ulcer of other part of unspecified foot limited to breakdown of skin: Secondary | ICD-10-CM

## 2016-08-09 NOTE — Progress Notes (Signed)
This patient returns to the office stating that his ulcer on his right foot has broken down further and has been bleeding and draining since his last visit. This patient presents the office wearing no bandage nor Cam Walker as was previously dispensed lesion appears to have increased in size since his last visit. The other ulcers on his feet appear to have closed with no drainage or infection. This patient. due to his medical problems spends a good part of his a nonweightbearing in bed. 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  He has a history of having ulcers form on both feet, but then they break down  GENERAL APPEARANCE: Alert, conversant. Appropriately groomed. No acute distress.  VASCULAR: Pedal pulses [facility]  [facility] bilateral.  Capillary refill time is immediate to all digits,  Normal temperature gradient.   NEUROLOGIC: sensation is diminished  to 5.07 monofilament at 5/5 sites bilateral.  Light touch is intact bilateral, Muscle strength normal.  MUSCULOSKELETAL: acceptable muscle strength, tone and stability bilateral.  Intrinsic muscluature intact bilateral.  Rectus appearance of foot and digits noted bilateral.   DERMATOLOGIC: All the ulcers except the big toe joint right have closed with no drainage noted.  No redness or infection to the ulcers except right big toe joint ulcer.  His ulcer has necrotic roof noted in the absence of redness or swelling or infection.  Necrotic hyperkeratotic roof noted.  Diabetic ulcer right foot  ROV  debridement necrotic tissue of the right foot ulcer. Upon debridement, the ulcer measures 30 x 30 mm normal granulation tissue is noted in the center of this ulcer. The size of the ulcer has increased since his last visit. At this time, I discussed with them that I feel they might benefit from another practitioner since the ulcers continue to break down after I have  them closed.  She [place] [facility] them to be seen by [place]. I told them that would be a good solution. I had planned to refer him to the wound Center for treatment of the right foot ulcer. I thought that the [place]iatrist in [place] might also want to refer him to the wound Center. Patient was told to return to the office as needed for my service  Helane GuntherGregory Hyacinth Marcelli DPM

## 2016-09-04 ENCOUNTER — Other Ambulatory Visit: Payer: Self-pay | Admitting: Internal Medicine

## 2016-09-04 DIAGNOSIS — E1042 Type 1 diabetes mellitus with diabetic polyneuropathy: Secondary | ICD-10-CM

## 2016-09-20 IMAGING — DX DG FOOT COMPLETE 3+V*L*
3 series · 3 of 3 positions shown · non-contrast
Comparison: 01/05/2014.

CLINICAL DATA: 34-year-old male with recurrent bilateral foot pain.

EXAM:
LEFT FOOT - COMPLETE 3+ VIEW

[foot ap]
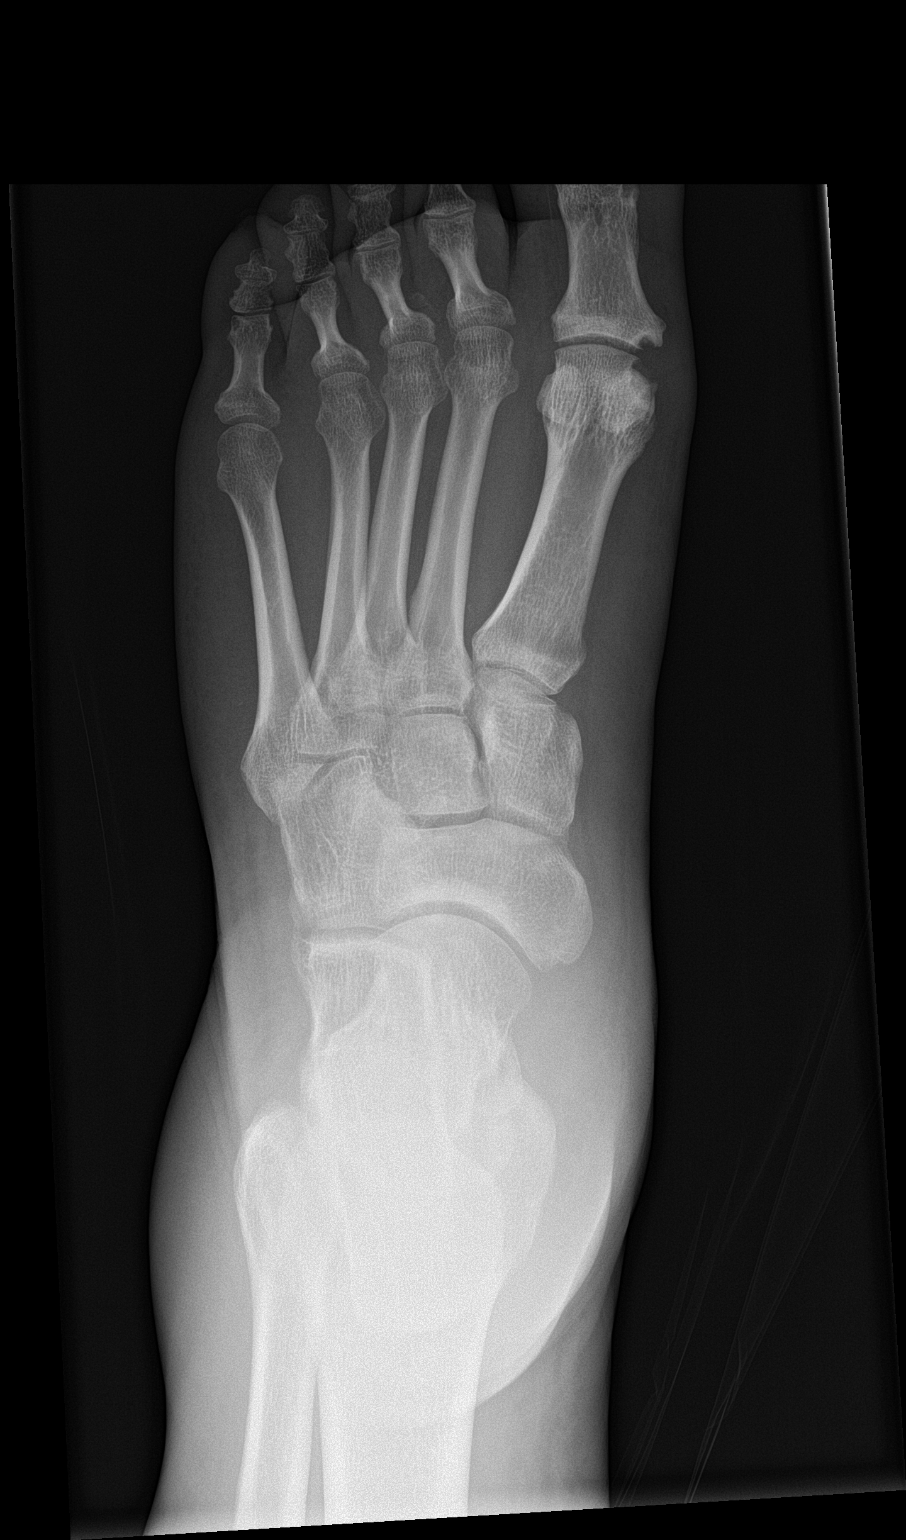

[foot obl]
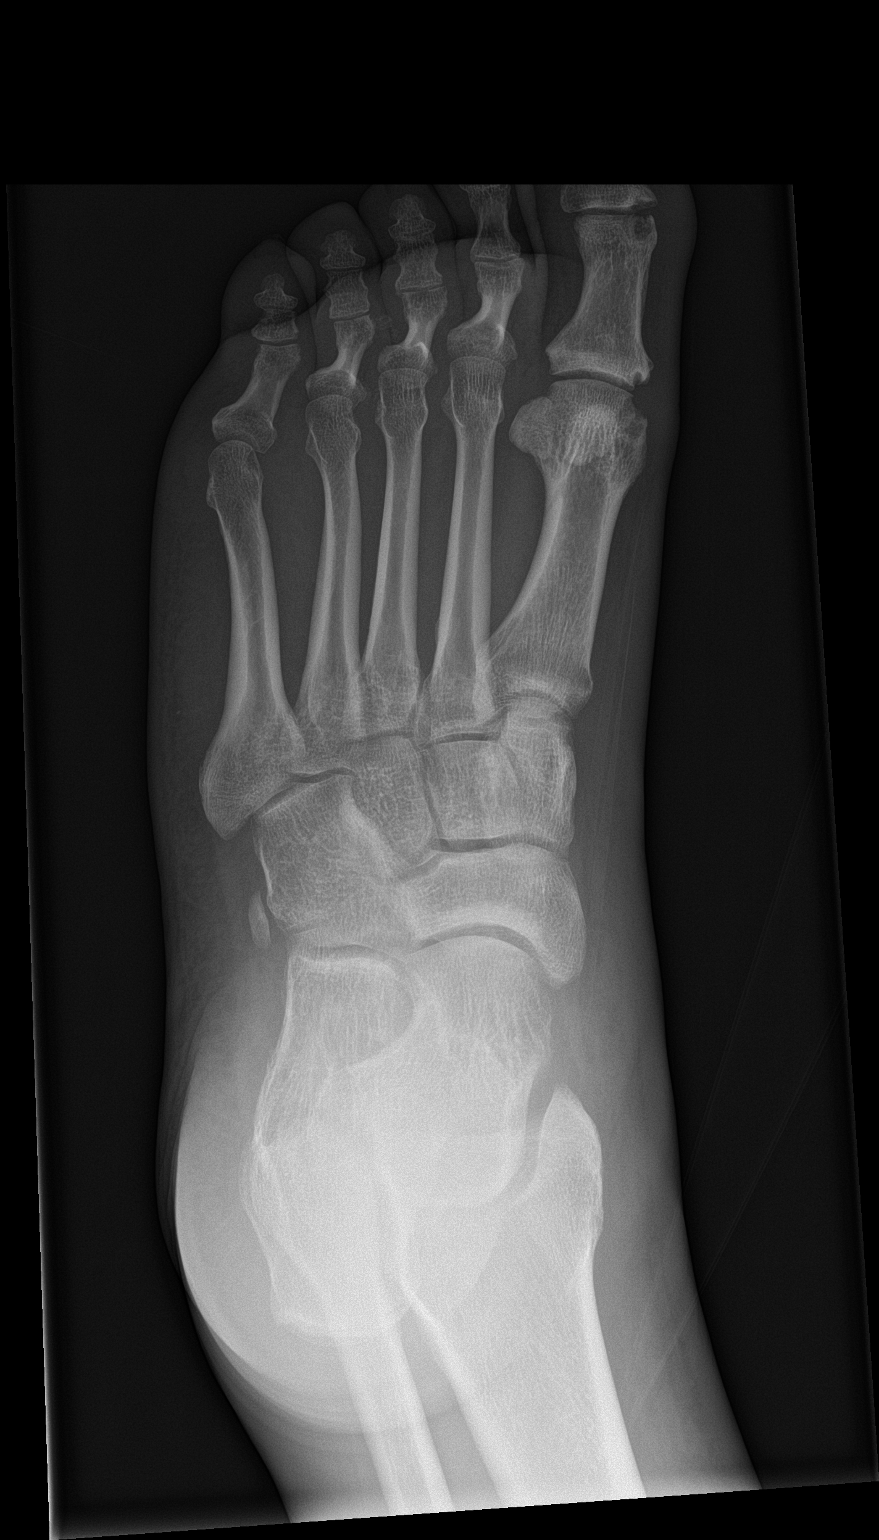

[foot lat]
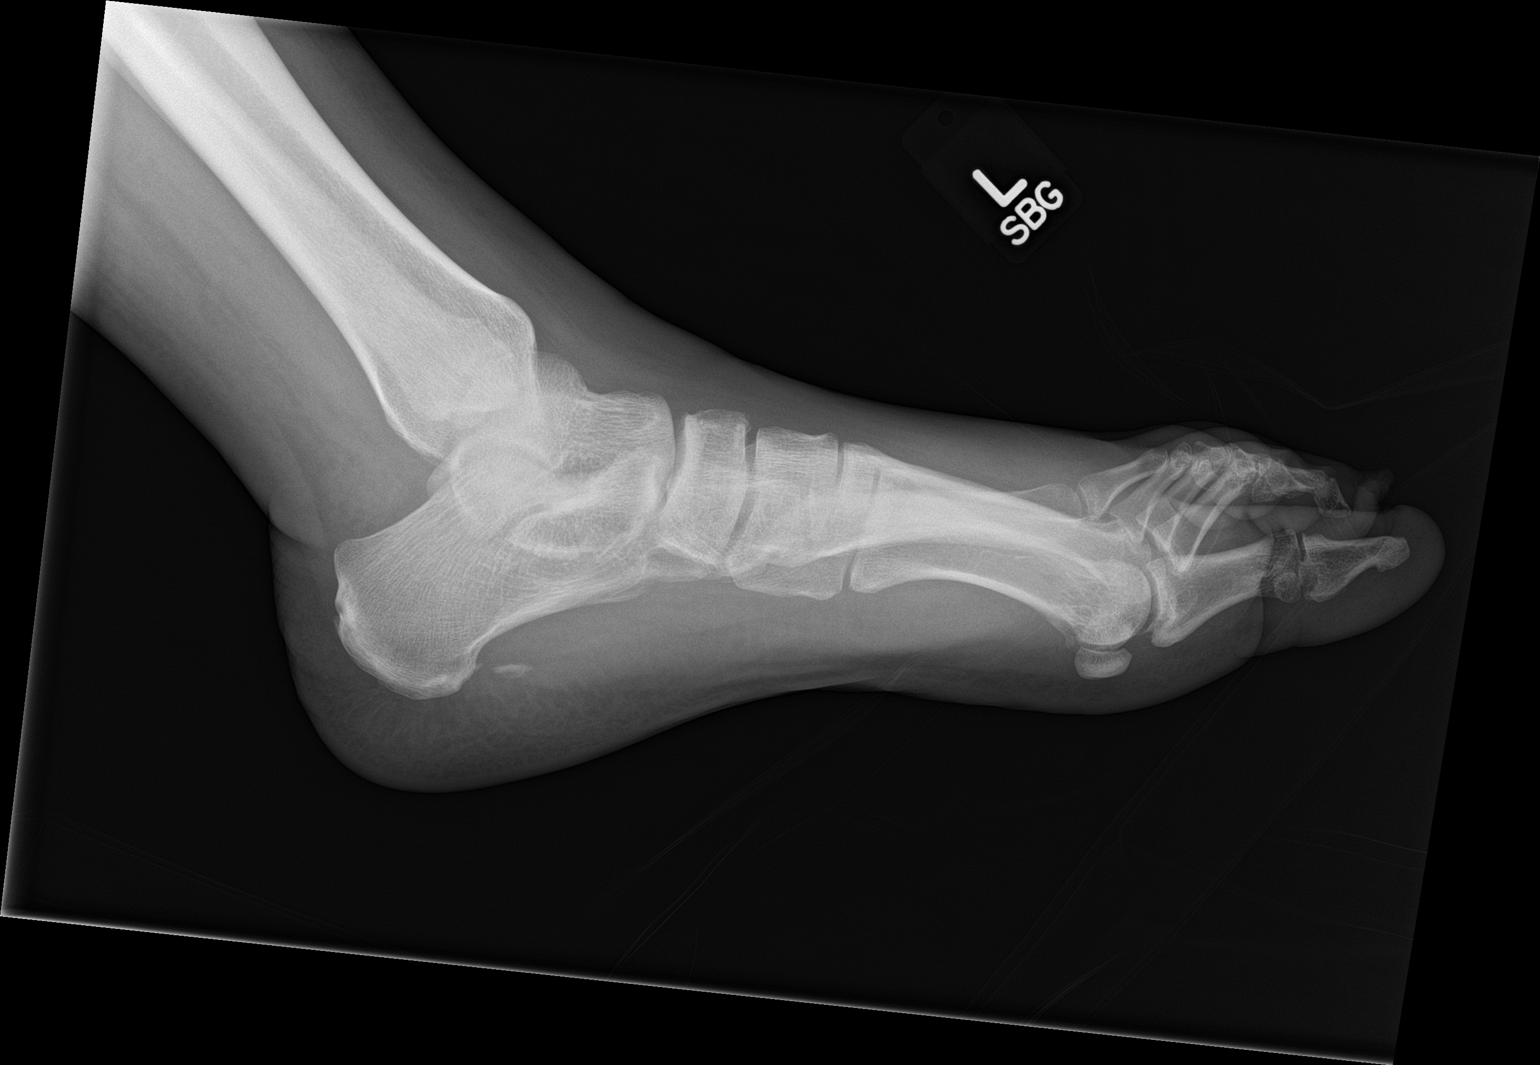

[3 of 3 positions shown; findings below may reference images not displayed]

FINDINGS: Large periarticular erosions are noted adjacent to the first MTP
joint, and at the first interphalangeal joint along the medial
margin in both locations. These have erosions have sclerotic margins
and overhanging edges, and there is adjacent soft tissue prominence,
suggestive of gouty erosions. No adjacent soft tissue calcification
is noted. No acute displaced fracture, subluxation or dislocation.
Small heterotopic ossification adjacent to the plantar aspect of the
calcaneus, likely ossification within the plantar fascia.
IMPRESSION: 1. Findings, as above, suggestive of underlying gout. Clinical
correlation is recommended.

## 2016-09-25 IMAGING — RF DG FLUORO GUIDE NDL PLC/BX
1 series · 1 of 1 positions shown · non-contrast
Comparison: none

CLINICAL DATA: Right shoulder pain and effusion.  Bacteremia.

[Series 1: cp_standard · 0.30mm/px · 1 of 1 slices shown]
[im 1/1]
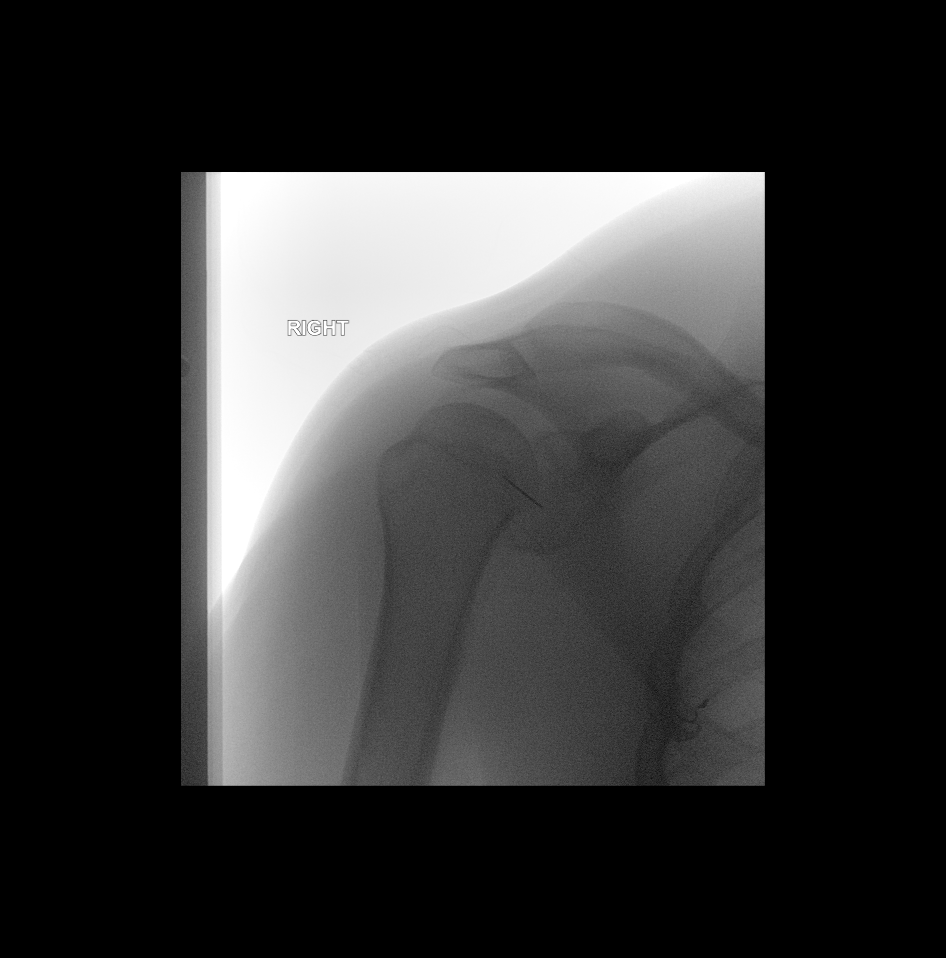

[1 of 1 positions shown; findings below may reference images not displayed]

EXAM:
RIGHT SHOULDER ASPIRATION UNDER FLUOROSCOPY

FLUOROSCOPY TIME:  0 min 24 seconds

PROCEDURE:
Overlying skin prepped with Betadine, draped in the usual sterile
fashion, and infiltrated locally with buffered Lidocaine. Twenty
gauge spinal needle was placed in the right glenohumeral joint under
direct fluoroscopic visualization.

2 cc of bloody cloudy fluid was aspirated from the joint. Mr.
Tutui tolerated the procedure well.
IMPRESSION: Technically successful right shoulder aspiration under fluoroscopy.
Fluid sent for laboratory analysis.

## 2016-10-03 IMAGING — DX DG CHEST 1V
1 series · 1 of 1 positions shown · non-contrast
Comparison: 01/14/2015

CLINICAL DATA: CHF, increasing right-sided chest pain

EXAM:
CHEST - 1 VIEW

[chest pa]
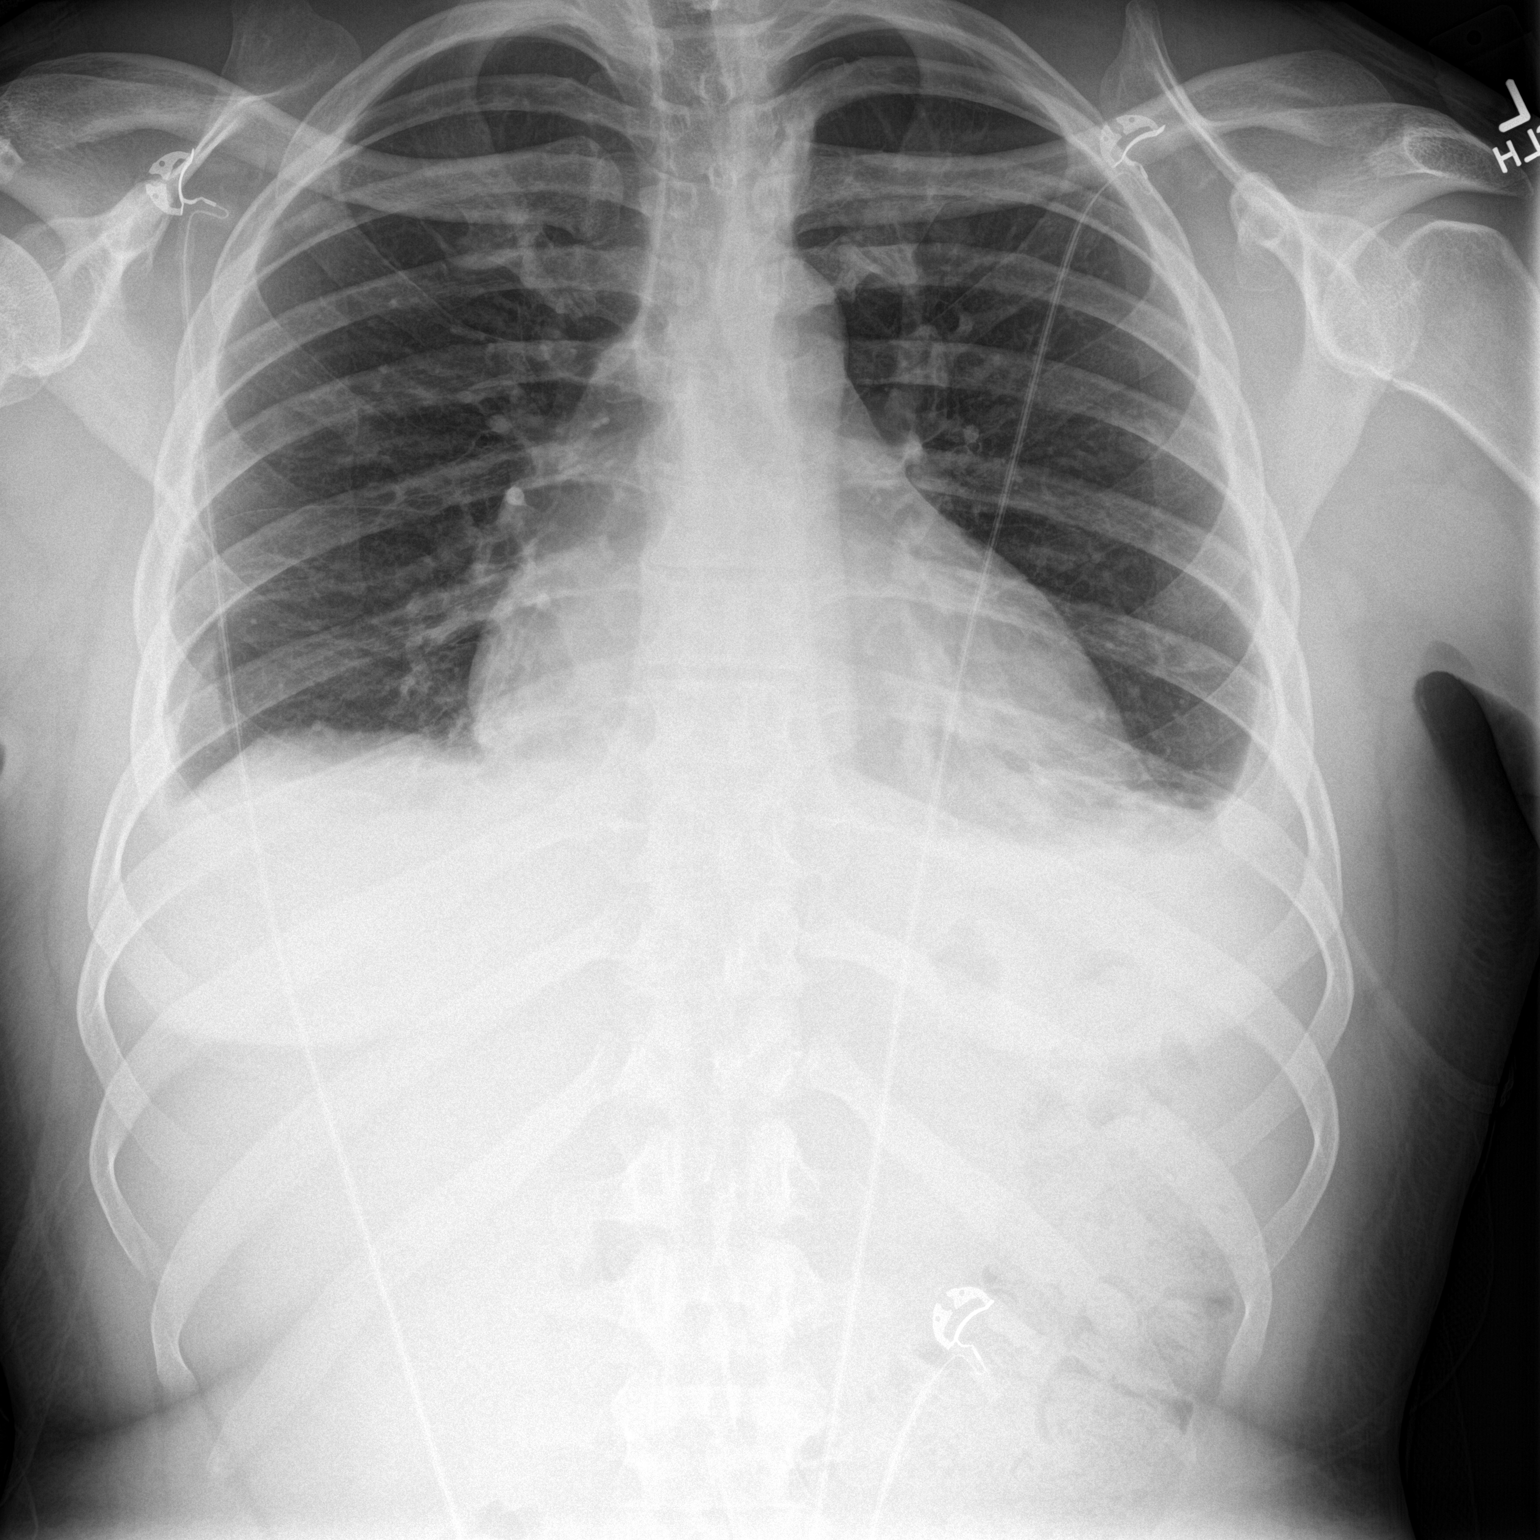

[1 of 1 positions shown; findings below may reference images not displayed]

FINDINGS: Cardiac shadow is mildly enlarged. Bibasilar atelectatic changes and
effusions are seen slightly increased from the prior exam. No other
focal abnormality is noted.
IMPRESSION: Slight increase in bibasilar changes.

## 2016-11-13 ENCOUNTER — Other Ambulatory Visit: Payer: Self-pay | Admitting: Internal Medicine

## 2016-11-13 DIAGNOSIS — E1042 Type 1 diabetes mellitus with diabetic polyneuropathy: Secondary | ICD-10-CM
